# Patient Record
Sex: Male | Born: 1965 | Race: Black or African American | Hispanic: No | Smoking: Never smoker
Health system: Southern US, Community
[De-identification: ages and names within clinical notes are randomized; demographics above are authoritative.]

## PROBLEM LIST (undated history)

## (undated) ENCOUNTER — Emergency Department (HOSPITAL_COMMUNITY): Admission: EM | Payer: 59 | Source: Home / Self Care

## (undated) DIAGNOSIS — Z9989 Dependence on other enabling machines and devices: Secondary | ICD-10-CM

## (undated) DIAGNOSIS — D649 Anemia, unspecified: Secondary | ICD-10-CM

## (undated) DIAGNOSIS — J189 Pneumonia, unspecified organism: Secondary | ICD-10-CM

## (undated) DIAGNOSIS — M86272 Subacute osteomyelitis, left ankle and foot: Secondary | ICD-10-CM

## (undated) DIAGNOSIS — N189 Chronic kidney disease, unspecified: Secondary | ICD-10-CM

## (undated) DIAGNOSIS — M869 Osteomyelitis, unspecified: Secondary | ICD-10-CM

## (undated) DIAGNOSIS — I10 Essential (primary) hypertension: Secondary | ICD-10-CM

## (undated) DIAGNOSIS — I739 Peripheral vascular disease, unspecified: Secondary | ICD-10-CM

## (undated) SURGERY — AMPUTATION BELOW KNEE
Anesthesia: Choice | Site: Knee | Laterality: Right

---

## 1999-07-31 ENCOUNTER — Emergency Department (HOSPITAL_COMMUNITY): Admission: EM | Admit: 1999-07-31 | Discharge: 1999-07-31 | Payer: Self-pay | Admitting: Emergency Medicine

## 2008-02-04 ENCOUNTER — Emergency Department (HOSPITAL_COMMUNITY): Admission: EM | Admit: 2008-02-04 | Discharge: 2008-02-04 | Payer: Self-pay | Admitting: Emergency Medicine

## 2008-02-20 DIAGNOSIS — Z794 Long term (current) use of insulin: Secondary | ICD-10-CM | POA: Insufficient documentation

## 2008-02-20 DIAGNOSIS — E119 Type 2 diabetes mellitus without complications: Secondary | ICD-10-CM | POA: Insufficient documentation

## 2008-02-20 DIAGNOSIS — I10 Essential (primary) hypertension: Secondary | ICD-10-CM | POA: Insufficient documentation

## 2008-02-20 DIAGNOSIS — E1165 Type 2 diabetes mellitus with hyperglycemia: Secondary | ICD-10-CM | POA: Insufficient documentation

## 2008-03-27 ENCOUNTER — Emergency Department (HOSPITAL_COMMUNITY): Admission: EM | Admit: 2008-03-27 | Discharge: 2008-03-28 | Payer: Self-pay | Admitting: Emergency Medicine

## 2008-05-19 ENCOUNTER — Emergency Department (HOSPITAL_COMMUNITY): Admission: EM | Admit: 2008-05-19 | Discharge: 2008-05-19 | Payer: Self-pay | Admitting: Emergency Medicine

## 2008-06-22 ENCOUNTER — Emergency Department (HOSPITAL_COMMUNITY): Admission: EM | Admit: 2008-06-22 | Discharge: 2008-06-22 | Payer: Self-pay | Admitting: Emergency Medicine

## 2008-08-05 ENCOUNTER — Emergency Department (HOSPITAL_COMMUNITY): Admission: EM | Admit: 2008-08-05 | Discharge: 2008-08-05 | Payer: Self-pay | Admitting: Emergency Medicine

## 2008-08-20 ENCOUNTER — Emergency Department (HOSPITAL_COMMUNITY): Admission: EM | Admit: 2008-08-20 | Discharge: 2008-08-20 | Payer: Self-pay | Admitting: Emergency Medicine

## 2008-09-18 ENCOUNTER — Emergency Department (HOSPITAL_COMMUNITY): Admission: EM | Admit: 2008-09-18 | Discharge: 2008-09-19 | Payer: Self-pay | Admitting: Emergency Medicine

## 2008-10-25 ENCOUNTER — Emergency Department (HOSPITAL_COMMUNITY): Admission: EM | Admit: 2008-10-25 | Discharge: 2008-10-25 | Payer: Self-pay | Admitting: Emergency Medicine

## 2008-12-04 ENCOUNTER — Emergency Department (HOSPITAL_COMMUNITY): Admission: EM | Admit: 2008-12-04 | Discharge: 2008-12-05 | Payer: Self-pay | Admitting: Emergency Medicine

## 2009-02-11 ENCOUNTER — Emergency Department (HOSPITAL_COMMUNITY): Admission: EM | Admit: 2009-02-11 | Discharge: 2009-02-11 | Payer: Self-pay | Admitting: Emergency Medicine

## 2009-03-18 ENCOUNTER — Emergency Department (HOSPITAL_COMMUNITY): Admission: EM | Admit: 2009-03-18 | Discharge: 2009-03-18 | Payer: Self-pay | Admitting: Emergency Medicine

## 2009-03-25 ENCOUNTER — Emergency Department (HOSPITAL_COMMUNITY): Admission: EM | Admit: 2009-03-25 | Discharge: 2009-03-25 | Payer: Self-pay | Admitting: Emergency Medicine

## 2009-04-18 ENCOUNTER — Emergency Department (HOSPITAL_COMMUNITY): Admission: EM | Admit: 2009-04-18 | Discharge: 2009-04-18 | Payer: Self-pay | Admitting: Emergency Medicine

## 2009-04-20 ENCOUNTER — Emergency Department (HOSPITAL_COMMUNITY): Admission: EM | Admit: 2009-04-20 | Discharge: 2009-04-20 | Payer: Self-pay | Admitting: Emergency Medicine

## 2009-04-26 ENCOUNTER — Encounter: Payer: Self-pay | Admitting: *Deleted

## 2009-05-02 ENCOUNTER — Emergency Department (HOSPITAL_COMMUNITY): Admission: EM | Admit: 2009-05-02 | Discharge: 2009-05-02 | Payer: Self-pay | Admitting: Emergency Medicine

## 2009-05-11 ENCOUNTER — Emergency Department (HOSPITAL_COMMUNITY): Admission: EM | Admit: 2009-05-11 | Discharge: 2009-05-12 | Payer: Self-pay | Admitting: Emergency Medicine

## 2009-06-15 ENCOUNTER — Emergency Department (HOSPITAL_COMMUNITY): Admission: EM | Admit: 2009-06-15 | Discharge: 2009-06-15 | Payer: Self-pay | Admitting: Emergency Medicine

## 2009-07-25 ENCOUNTER — Emergency Department (HOSPITAL_COMMUNITY): Admission: EM | Admit: 2009-07-25 | Discharge: 2009-07-25 | Payer: Self-pay | Admitting: Emergency Medicine

## 2009-09-03 ENCOUNTER — Emergency Department (HOSPITAL_COMMUNITY): Admission: EM | Admit: 2009-09-03 | Discharge: 2009-09-03 | Payer: Self-pay | Admitting: Emergency Medicine

## 2009-10-10 ENCOUNTER — Emergency Department (HOSPITAL_COMMUNITY): Admission: EM | Admit: 2009-10-10 | Discharge: 2009-10-10 | Payer: Self-pay | Admitting: Emergency Medicine

## 2009-10-12 ENCOUNTER — Emergency Department (HOSPITAL_COMMUNITY): Admission: EM | Admit: 2009-10-12 | Discharge: 2009-10-12 | Payer: Self-pay | Admitting: Emergency Medicine

## 2009-11-14 ENCOUNTER — Emergency Department (HOSPITAL_COMMUNITY): Admission: EM | Admit: 2009-11-14 | Discharge: 2009-11-14 | Payer: Self-pay | Admitting: Emergency Medicine

## 2009-12-23 ENCOUNTER — Emergency Department (HOSPITAL_COMMUNITY): Admission: EM | Admit: 2009-12-23 | Discharge: 2009-12-23 | Payer: Self-pay | Admitting: Emergency Medicine

## 2010-01-25 ENCOUNTER — Emergency Department (HOSPITAL_COMMUNITY)
Admission: EM | Admit: 2010-01-25 | Discharge: 2010-01-25 | Payer: Self-pay | Source: Home / Self Care | Admitting: Emergency Medicine

## 2010-01-25 LAB — CONVERTED CEMR LAB
Creatinine, Ser: 1.23 mg/dL
MCHC: 27.6 g/dL
Potassium: 4 meq/L
RBC: 4.68 M/uL
Sodium: 137 meq/L
WBC: 4.9 10*3/uL

## 2010-01-27 ENCOUNTER — Ambulatory Visit: Payer: Self-pay | Admitting: Nurse Practitioner

## 2010-01-27 DIAGNOSIS — E669 Obesity, unspecified: Secondary | ICD-10-CM | POA: Insufficient documentation

## 2010-01-27 DIAGNOSIS — M109 Gout, unspecified: Secondary | ICD-10-CM | POA: Insufficient documentation

## 2010-01-27 LAB — CONVERTED CEMR LAB
Bilirubin Urine: NEGATIVE
Blood in Urine, dipstick: NEGATIVE
Glucose, Urine, Semiquant: 100
Ketones, urine, test strip: NEGATIVE
Nitrite: NEGATIVE

## 2010-03-21 NOTE — Assessment & Plan Note (Signed)
Summary: [facility] F/U   Vital Signs:  Patient profile:   45 year old male Height:      76 inches Weight:      355.0 pounds BMI:     43.37 Temp:     97.8 degrees F oral Pulse rate:   68 / minute Pulse rhythm:   regular Resp:     16 per minute BP sitting:   136 / 100  (left arm) Cuff size:   regular  Vitals Entered By: Isla Pence (January 27, 2010 10:06 AM)  Nutrition Counseling: Patient's BMI is greater than 25 and therefore counseled on weight management options. CC: follow-up visit [facility], Hypertension Management Is Patient Diabetic? Yes Pain Assessment Patient in pain? no      CBG Result 169 CBG Device ID B  Does patient need assistance? Functional Status Self care Ambulation Normal   CC:  follow-up visit [facility] and Hypertension Management.  History of Present Illness:  Pt into the office to estabish care. No PCP rather he has been using the ER for PCP to get refills on his meds. He was seen in the ER on 01/25/2010(discharge reviewed)  Obesity - pt has lost 30 pounds and admits that he still need to work on his diet and exercise to lose weight.  Pt did not bring his medications with him to this office.    Diabetes Management History:      The patient is a 45 years old male who comes in for evaluation of DM Type 2.  He has not been enrolled in the "Diabetic Education Program".  He states understanding of dietary principles but he is not following the appropriate diet.  No sensory loss is reported.  Self foot exams are not being performed.  He is not checking home blood sugars.  He says that he is not exercising regularly.        Hypoglycemic symptoms are not occurring.  No hyperglycemic symptoms are reported.    Hypertension History:      He denies headache, chest pain, and palpitations.  He notes no problems with any antihypertensive medication side effects.        Positive major cardiovascular risk factors include diabetes and hypertension.  Negative  major cardiovascular risk factors include male age less than 17 years old, no history of hyperlipidemia, and non-tobacco-user status.     Habits & Providers  Alcohol-Tobacco-Diet     Alcohol drinks/day: 0     Tobacco Status: never  Exercise-Depression-Behavior     Does Patient Exercise: no     Have you felt down or hopeless? no     Have you felt little pleasure in things? no     Depression Counseling: not indicated; screening negative for depression     Drug Use: never  Allergies (verified): No Known Drug Allergies  Family History: father - gout mother - diabetes, htn   Social History: Married tobacco -  Drug -  [place] - Smoking Status:  never Drug Use:  never Does Patient Exercise:  no  Review of Systems General:  Denies loss of appetite. CV:  Denies chest pain or discomfort. Resp:  Denies cough. GI:  Denies abdominal pain, nausea, and vomiting.  Physical Exam  General:  alert.   Head:  normocephalic.   Lungs:  normal breath sounds.   Heart:  normal rate and regular rhythm.   Abdomen:  normal bowel sounds.   Neurologic:  alert & oriented X3.   Skin:  color  normal.   Psych:  Oriented X3.     Impression & Recommendations:  Problem # 1:  HYPERTENSION, BENIGN ESSENTIAL (ICD-401.1)  Pt is has a history  fo htn.  Will restart meds DASH diet His updated medication list for this problem includes:    Lisinopril-hydrochlorothiazide 20-25 Mg Tabs (Lisinopril-hydrochlorothiazide) ..... One tablet by mouth daily for blood pressure  Problem # 2:  DIABETES MELLITUS, TYPE II (ICD-250.00) Advised pt to restart on meds  Orders: Capillary Blood Glucose/CBG [id number])  His updated medication list for this problem includes:    Metformin Hcl 500 Mg Tabs (Metformin hcl) ..... One tablet by mouth two times a day for blood sugar    Lisinopril-hydrochlorothiazide 20-25 Mg Tabs (Lisinopril-hydrochlorothiazide) ..... One tablet by mouth daily for blood pressure  Problem # 3:   OBESITY (ICD-278.00) advised pt to continue to monitor   Problem # 4:  NEED PROPHYLACTIC VACCINATION&INOCULATION FLU (ICD-V04.81) given today in office  Complete Medication List: 1)  Metformin Hcl 500 Mg Tabs (Metformin hcl) .... One tablet by mouth two times a day for blood sugar 2)  Lisinopril-hydrochlorothiazide 20-25 Mg Tabs (Lisinopril-hydrochlorothiazide) .... One tablet by mouth daily for blood pressure  Other Orders: Flu Vaccine 57yrs + [id number]) Admin 1st Vaccine [id number])  Diabetes Management Assessment/Plan:      His blood pressure goal is < 130/80.    Hypertension Assessment/Plan:      The patient's hypertensive risk group is category C: Target organ damage and/or diabetes.  Today's blood pressure is 136/100.  His blood pressure goal is < 130/80.  Patient Instructions: 1)  Blood pressure -elevated today. 2)  You will need to restarrt your medications 3)  Diabetes - You should check your blood sugar daily before breakfast.  You can get a glucometer from [facility] (you will need a prescription) after you use the supplies from your current meter 4)  Keep your appointment for eligibility to get an orange card. 5)  Follow up in 2 months with n.martin,fnp for diabetes 6)  Come fasting after midnight before this visit 7)  You will need cbg, u/a, hgba1c, microalbumin.  Will need lipid, uric acid level. Prescriptions: LISINOPRIL-HYDROCHLOROTHIAZIDE 20-25 MG TABS (LISINOPRIL-HYDROCHLOROTHIAZIDE) One tablet by mouth daily for blood pressure  #30 x 3   Entered and Authorized by:   Aurora Mask FNP   Signed by:   Aurora Mask FNP on 01/27/2010   Method used:   Print then Give to Patient   RxID:   [id number] METFORMIN HCL 500 MG TABS (METFORMIN HCL) One tablet by mouth two times a day for blood sugar  #60 x 3   Entered and Authorized by:   Aurora Mask FNP   Signed by:   Aurora Mask FNP on 01/27/2010   Method used:   Print then Give to Patient   RxID:    [id number]    Orders Added: 1)  Capillary Blood Glucose/CBG [82948] 2)  New Patient Level III [99203] 3)  Flu Vaccine 93yrs + [id number] 4)  Admin 1st Vaccine [90471]   Immunizations Administered:  Influenza Vaccine # 1:    Vaccine Type: Fluvax 3+    Site: left deltoid    Mfr: GlaxoSmithKline    Dose: 0.5 ml    Route: IM    Given by: Isla Pence    Exp. Date: 08/19/2010    Lot #: [id number]    VIS given: 09/13/09 version given January 27, 2010.  Flu Vaccine Consent Questions:    Do you have  a history of severe allergic reactions to this vaccine? no    Any prior history of allergic reactions to egg and/or gelatin? no    Do you have a sensitivity to the preservative Thimersol? no    Do you have a past history of Guillan-Barre Syndrome? no    Do you currently have an acute febrile illness? no    Have you ever had a severe reaction to latex? no    Vaccine information given and explained to patient? yes    ndc  [phone]  Immunizations Administered:  Influenza Vaccine # 1:    Vaccine Type: Fluvax 3+    Site: left deltoid    Mfr: GlaxoSmithKline    Dose: 0.5 ml    Route: IM    Given by: Isla Pence    Exp. Date: 08/19/2010    Lot #: [id number]    VIS given: 09/13/09 version given January 27, 2010.  Laboratory Results   Urine Tests  Date/Time Received: January 27, 2010 10:24 AM   Routine Urinalysis   Color: lt. yellow Appearance: Clear Glucose: 100   (Normal Range: Negative) Bilirubin: negative   (Normal Range: Negative) Ketone: negative   (Normal Range: Negative) Spec. Gravity: >=1.030   (Normal Range: 1.003-1.035) Blood: negative   (Normal Range: Negative) pH: 5.5   (Normal Range: 5.0-8.0) Protein: negative   (Normal Range: Negative) Urobilinogen: 0.2   (Normal Range: 0-1) Nitrite: negative   (Normal Range: Negative) Leukocyte Esterace: trace   (Normal Range: Negative)     Blood Tests     CBG Random:: 169     Prevention & Chronic  Care Immunizations   Influenza vaccine: Fluvax 3+  (01/27/2010)    Tetanus booster: 02/20/2007: historical per pt    Pneumococcal vaccine: Not documented  Other Screening   Smoking status: never  (01/27/2010)  Diabetes Mellitus   HgbA1C: Not documented    Eye exam: Not documented    Foot exam: Not documented   High risk foot: Not documented   Foot care education: Not documented    Urine microalbumin/creatinine ratio: Not documented  Lipids   Total Cholesterol: Not documented   LDL: Not documented   LDL Direct: Not documented   HDL: Not documented   Triglycerides: Not documented  Hypertension   Last Blood Pressure: 136 / 100  (01/27/2010)   Serum creatinine: 1.23  (01/25/2010)   Serum potassium 4.0  (01/25/2010)  Self-Management Support :    Diabetes self-management support: Not documented    Hypertension self-management support: Not documented   Nursing Instructions: Give Flu vaccine today

## 2010-03-21 NOTE — Miscellaneous (Signed)
Summary: Do Not Reschedule  Patient has no showed 2 NP appts, one in November and one in March.  Per [facility] policy he is not allowed to rechedule.  Elray Mcgregor RN  April 26, 2009 8:22 AM

## 2010-05-02 LAB — DIFFERENTIAL
Eosinophils Absolute: 0.2 10*3/uL (ref 0.0–0.7)
Lymphocytes Relative: 41 % (ref 12–46)
Lymphs Abs: 2 10*3/uL (ref 0.7–4.0)
Monocytes Absolute: 0.3 10*3/uL (ref 0.1–1.0)
Monocytes Relative: 6 % (ref 3–12)
Neutro Abs: 2.4 10*3/uL (ref 1.7–7.7)

## 2010-05-02 LAB — BASIC METABOLIC PANEL
Calcium: 9.4 mg/dL (ref 8.4–10.5)
Creatinine, Ser: 1.23 mg/dL (ref 0.4–1.5)
GFR calc Af Amer: >60 mL/min (ref >60)
Sodium: 137 mEq/L (ref 135–145)

## 2010-05-02 LAB — CBC
HCT: 39.1 % (ref 39.0–52.0)
Hemoglobin: 12.9 g/dL — ABNORMAL LOW (ref 13.0–17.0)
MCHC: 33 g/dL (ref 30.0–36.0)

## 2010-05-08 LAB — GLUCOSE, CAPILLARY: Glucose-Capillary: 278 mg/dL — ABNORMAL HIGH (ref 70–99)

## 2010-05-15 LAB — GLUCOSE, CAPILLARY: Glucose-Capillary: 223 mg/dL — ABNORMAL HIGH (ref 70–99)

## 2010-05-22 LAB — GLUCOSE, CAPILLARY: Glucose-Capillary: 305 mg/dL — ABNORMAL HIGH (ref 70–99)

## 2010-05-26 LAB — GLUCOSE, CAPILLARY: Glucose-Capillary: 176 mg/dL — ABNORMAL HIGH (ref 70–99)

## 2010-05-28 LAB — GLUCOSE, CAPILLARY: Glucose-Capillary: 237 mg/dL — ABNORMAL HIGH (ref 70–99)

## 2010-06-06 LAB — GLUCOSE, CAPILLARY: Glucose-Capillary: 451 mg/dL — ABNORMAL HIGH (ref 70–99)

## 2010-08-01 ENCOUNTER — Emergency Department (HOSPITAL_COMMUNITY)
Admission: EM | Admit: 2010-08-01 | Discharge: 2010-08-02 | Discharge status: Against Medical Advice | Payer: Medicaid Other | Source: Home / Self Care | Attending: Emergency Medicine | Admitting: Emergency Medicine

## 2010-08-01 DIAGNOSIS — I10 Essential (primary) hypertension: Secondary | ICD-10-CM | POA: Insufficient documentation

## 2010-08-01 DIAGNOSIS — E119 Type 2 diabetes mellitus without complications: Secondary | ICD-10-CM | POA: Insufficient documentation

## 2010-08-01 DIAGNOSIS — Z760 Encounter for issue of repeat prescription: Secondary | ICD-10-CM | POA: Insufficient documentation

## 2010-08-01 DIAGNOSIS — M109 Gout, unspecified: Secondary | ICD-10-CM | POA: Insufficient documentation

## 2010-08-01 DIAGNOSIS — Z0389 Encounter for observation for other suspected diseases and conditions ruled out: Secondary | ICD-10-CM | POA: Insufficient documentation

## 2010-08-02 ENCOUNTER — Emergency Department (HOSPITAL_COMMUNITY)
Admission: EM | Admit: 2010-08-02 | Discharge: 2010-08-02 | Disposition: A | Discharge status: Home / Self Care | Payer: Medicaid Other | Attending: Emergency Medicine | Admitting: Emergency Medicine

## 2010-08-02 LAB — GLUCOSE, CAPILLARY: Glucose-Capillary: 259 mg/dL — ABNORMAL HIGH (ref 70–99)

## 2010-11-17 ENCOUNTER — Inpatient Hospital Stay (INDEPENDENT_AMBULATORY_CARE_PROVIDER_SITE_OTHER)
Admission: RE | Admit: 2010-11-17 | Discharge: 2010-11-17 | Disposition: A | Discharge status: Home / Self Care | Payer: Medicaid Other | Source: Ambulatory Visit | Attending: Family Medicine | Admitting: Family Medicine

## 2010-11-17 DIAGNOSIS — M779 Enthesopathy, unspecified: Secondary | ICD-10-CM

## 2011-01-04 ENCOUNTER — Encounter: Payer: Self-pay | Admitting: Nurse Practitioner

## 2011-01-04 ENCOUNTER — Emergency Department (HOSPITAL_COMMUNITY)
Admission: EM | Admit: 2011-01-04 | Discharge: 2011-01-04 | Disposition: A | Discharge status: Home / Self Care | Payer: Medicaid Other | Attending: Emergency Medicine | Admitting: Emergency Medicine

## 2011-01-04 DIAGNOSIS — I10 Essential (primary) hypertension: Secondary | ICD-10-CM | POA: Insufficient documentation

## 2011-01-04 DIAGNOSIS — R21 Rash and other nonspecific skin eruption: Secondary | ICD-10-CM | POA: Insufficient documentation

## 2011-01-04 DIAGNOSIS — E119 Type 2 diabetes mellitus without complications: Secondary | ICD-10-CM | POA: Insufficient documentation

## 2011-01-04 DIAGNOSIS — M25421 Effusion, right elbow: Secondary | ICD-10-CM

## 2011-01-04 DIAGNOSIS — M109 Gout, unspecified: Secondary | ICD-10-CM | POA: Insufficient documentation

## 2011-01-04 DIAGNOSIS — R609 Edema, unspecified: Secondary | ICD-10-CM | POA: Insufficient documentation

## 2011-01-04 DIAGNOSIS — Z79899 Other long term (current) drug therapy: Secondary | ICD-10-CM | POA: Insufficient documentation

## 2011-01-04 DIAGNOSIS — M25429 Effusion, unspecified elbow: Secondary | ICD-10-CM | POA: Insufficient documentation

## 2011-01-04 HISTORY — DX: Essential (primary) hypertension: I10

## 2011-01-04 MED ORDER — OXYCODONE-ACETAMINOPHEN 5-325 MG PO TABS: 1.0000 | ORAL_TABLET | Freq: Four times a day (QID) | ORAL | Status: AC | PRN

## 2011-01-04 MED ORDER — INDOMETHACIN 25 MG PO CAPS: 50.0000 mg | ORAL_CAPSULE | Freq: Three times a day (TID) | ORAL | Status: DC | PRN

## 2011-01-04 NOTE — ED Notes (Signed)
C/o gout flare up x weeks. C/o pain in bilateral elbows. No meds at home for same.

## 2011-01-04 NOTE — ED Provider Notes (Signed)
Medical screening examination/treatment/procedure(s) were performed by non-physician practitioner and as supervising physician I was immediately available for consultation/collaboration.  Chauncy Passy, MD 01/04/11 2111

## 2011-01-04 NOTE — ED Provider Notes (Signed)
History     CSN: [id number] Arrival date & time: 01/04/2011 12:38 PM   First MD Initiated Contact with Patient 01/04/11 1241      Chief Complaint  Patient presents with  . Gout    (Consider location/radiation/quality/duration/timing/severity/associated sxs/prior treatment) HPI Comments: Patient presents with right elbow swelling and redness for the past 3 days. The patient has a history of gouty arthritis, with previous attacks in his elbows ankles and toes bilaterally. This attack is similar to previous. He has tried oral narcotics for pain which has helped mildly. Pain is worsened with palpation and movement. The patient denies any fevers, chills, nausea or vomiting.  Patient is a 45 y.o. male presenting with extremity pain. The history is provided by the patient.  Extremity Pain This is a recurrent problem. The current episode started in the past 7 days. The problem occurs constantly. The problem has been gradually worsening. Associated symptoms include a rash. Pertinent negatives include no arthralgias, chills, fever, nausea, numbness, vomiting or weakness. The symptoms are aggravated by bending. He has tried oral narcotics for the symptoms. The treatment provided no relief.    Past Medical History  Diagnosis Date  . Gout   . Diabetes mellitus   . Hypertension     History reviewed. No pertinent past surgical history.  History reviewed. No pertinent family history.  History  Substance Use Topics  . Smoking status: Never Smoker   . Smokeless tobacco: Not on file  . Alcohol Use: No      Review of Systems  Constitutional: Negative for fever, chills and activity change.  Gastrointestinal: Negative for nausea and vomiting.  Musculoskeletal: Negative for back pain and arthralgias.  Skin: Positive for rash. Negative for wound.  Neurological: Negative for weakness and numbness.    Allergies  Review of patient's allergies indicates no known allergies.  Home Medications     Current Outpatient Rx  Name Route Sig Dispense Refill  . OMEGA-3 FATTY ACIDS 1000 MG PO CAPS Oral Take 1 g by mouth daily.      Marland Kitchen LISINOPRIL-HYDROCHLOROTHIAZIDE 20-25 MG PO TABS Oral Take 1 tablet by mouth daily.      Marland Kitchen METFORMIN HCL 500 MG PO TABS Oral Take 500 mg by mouth 2 (two) times daily with a meal.        BP 135/116  Pulse 93  Temp(Src) 98 F (36.7 C) (Oral)  Resp 19  Ht 6\' 4"  (1.93 m)  Wt 320 lb (145.151 kg)  BMI 38.95 kg/m2  SpO2 98%  Physical Exam  Nursing note and vitals reviewed. Constitutional: He is oriented to person, place, and time. He appears well-developed and well-nourished.       Patient appears well and nontoxic.  HENT:  Head: Normocephalic and atraumatic.  Eyes: Conjunctivae are normal.  Neck: Normal range of motion.  Cardiovascular: Normal rate and regular rhythm.   Pulmonary/Chest: Effort normal and breath sounds normal.  Musculoskeletal: He exhibits edema and tenderness.       Right elbow effusion. Elbow is mildly tender to palpation with some mild overlying erythema, but no streaking.  Range of motion is decreased somewhat due to pain. No axial lymphadenopathy.  Neurological: He is alert and oriented to person, place, and time.  Skin: Skin is warm. There is erythema.    ED Course  Procedures (including critical care time)  Labs Reviewed - No data to display No results found.   1. GOUT   2. Effusion of right elbow  2:21 PM patient seen and examined. Counseled on proper use of narcotic pain medicine and to avoid other medicines containing Tylenol. Urged followup with primary care physician in the next week for a recheck. Urged patient to return with worsening pain, swelling, or fever.   MDM  Patient with history of gouty arthritis with typical gout flare of right elbow. No concern for a septic joint given lack of fever, appearance, and examination of elbow.       [place], [place] 01/04/11 1422

## 2011-01-28 ENCOUNTER — Encounter (HOSPITAL_COMMUNITY): Payer: Self-pay | Admitting: *Deleted

## 2011-01-28 ENCOUNTER — Emergency Department (HOSPITAL_COMMUNITY)
Admission: EM | Admit: 2011-01-28 | Discharge: 2011-01-28 | Disposition: A | Discharge status: Home / Self Care | Payer: Medicaid Other | Attending: Emergency Medicine | Admitting: Emergency Medicine

## 2011-01-28 DIAGNOSIS — R269 Unspecified abnormalities of gait and mobility: Secondary | ICD-10-CM | POA: Insufficient documentation

## 2011-01-28 DIAGNOSIS — M25473 Effusion, unspecified ankle: Secondary | ICD-10-CM | POA: Insufficient documentation

## 2011-01-28 DIAGNOSIS — M79609 Pain in unspecified limb: Secondary | ICD-10-CM | POA: Insufficient documentation

## 2011-01-28 DIAGNOSIS — Z79899 Other long term (current) drug therapy: Secondary | ICD-10-CM | POA: Insufficient documentation

## 2011-01-28 DIAGNOSIS — E119 Type 2 diabetes mellitus without complications: Secondary | ICD-10-CM | POA: Insufficient documentation

## 2011-01-28 DIAGNOSIS — M25579 Pain in unspecified ankle and joints of unspecified foot: Secondary | ICD-10-CM | POA: Insufficient documentation

## 2011-01-28 DIAGNOSIS — I10 Essential (primary) hypertension: Secondary | ICD-10-CM | POA: Insufficient documentation

## 2011-01-28 DIAGNOSIS — M25476 Effusion, unspecified foot: Secondary | ICD-10-CM | POA: Insufficient documentation

## 2011-01-28 DIAGNOSIS — M109 Gout, unspecified: Secondary | ICD-10-CM

## 2011-01-28 MED ORDER — INDOMETHACIN 25 MG PO CAPS: 50.0000 mg | ORAL_CAPSULE | Freq: Three times a day (TID) | ORAL | Status: AC | PRN

## 2011-01-28 MED ORDER — HYDROCODONE-ACETAMINOPHEN 5-325 MG PO TABS: 1.0000 | ORAL_TABLET | Freq: Four times a day (QID) | ORAL | Status: AC | PRN

## 2011-01-28 NOTE — ED Provider Notes (Signed)
History     CSN: [id number] Arrival date & time: 01/28/2011 11:19 AM   First MD Initiated Contact with Patient 01/28/11 1240     HPI Patient reports a history of gout. States his left ankle began having his typical gout pain. States pain has progressively been worsening and is out of his indomethacin. Denies other known injury. Symptoms associated with left ankle swelling and erythema. Denies fever or purulent drainage. Reports mild improvement with elevation Patient is a 45 y.o. male presenting with ankle pain. The history is provided by the patient.  Ankle Pain  The incident occurred more than 2 days ago. The incident occurred at home. There was no injury mechanism. The pain is present in the left ankle. The quality of the pain is described as sharp. The pain is at a severity of 9/10. The pain is severe. The pain has been constant since onset. Pertinent negatives include no numbness, no inability to bear weight, no loss of motion, no muscle weakness, no loss of sensation and no tingling. He reports no foreign bodies present. The symptoms are aggravated by bearing weight, palpation and activity. He has tried rest, ice and elevation for the symptoms. The treatment provided no relief.    Past Medical History  Diagnosis Date  . Gout   . Diabetes mellitus   . Hypertension     History reviewed. No pertinent past surgical history.  History reviewed. No pertinent family history.  History  Substance Use Topics  . Smoking status: Never Smoker   . Smokeless tobacco: Not on file  . Alcohol Use: No      Review of Systems  Constitutional: Negative for fever and chills.  Gastrointestinal: Negative for nausea and vomiting.  Musculoskeletal: Positive for joint swelling and gait problem. Negative for back pain.       Ankle pain  Skin: Positive for color change. Negative for wound.  Neurological: Negative for tingling, weakness and numbness.  All other systems reviewed and are  negative.    Allergies  Review of patient's allergies indicates no known allergies.  Home Medications   Current Outpatient Rx  Name Route Sig Dispense Refill  . OMEGA-3 FATTY ACIDS 1000 MG PO CAPS Oral Take 1 g by mouth daily.      . INDOMETHACIN 25 MG PO CAPS Oral Take 2 capsules (50 mg total) by mouth 3 (three) times daily as needed. Take with food. 21 capsule 0  . LISINOPRIL-HYDROCHLOROTHIAZIDE 20-25 MG PO TABS Oral Take 1 tablet by mouth daily.      Marland Kitchen METFORMIN HCL 500 MG PO TABS Oral Take 500 mg by mouth 2 (two) times daily with a meal.        BP 120/80  Pulse 80  Temp(Src) 98.6 F (37 C) (Oral)  Resp 16  SpO2 99%  Physical Exam  Vitals reviewed. Constitutional: He is oriented to person, place, and time. He appears well-developed and well-nourished.  HENT:  Head: Normocephalic and atraumatic.  Eyes: Pupils are equal, round, and reactive to light.  Musculoskeletal:       Left ankle: He exhibits decreased range of motion and swelling. He exhibits no ecchymosis, no deformity, no laceration and normal pulse. tenderness. Medial malleolus tenderness found. Achilles tendon exhibits no pain.       Feet:  Neurological: He is alert and oriented to person, place, and time.  Skin: Skin is warm and dry. No rash noted. No erythema. No pallor.  Psychiatric: He has a normal mood and affect. His  behavior is normal.    ED Course  Procedures   MDM   Advised followup with primary care physician for prophylactic gout medication. Patient agrees with plan and is ready for discharge       Sheliah Mends, [place] 01/28/11 1454

## 2011-01-28 NOTE — ED Notes (Signed)
Reports gout pain to left foot. Is out of meds.

## 2011-01-29 MED ORDER — NITROGLYCERIN IN D5W 200-5 MCG/ML-% IV SOLN
INTRAVENOUS | Status: AC
  Filled 2011-01-29: qty 250

## 2011-01-29 NOTE — ED Provider Notes (Signed)
Medical screening examination/treatment/procedure(s) were performed by non-physician practitioner and as supervising physician I was immediately available for consultation/collaboration.   Mirna Mires, MD 01/29/11 [phone]

## 2012-03-18 ENCOUNTER — Emergency Department (HOSPITAL_COMMUNITY)
Admission: EM | Admit: 2012-03-18 | Discharge: 2012-03-18 | Disposition: A | Discharge status: Home / Self Care | Payer: Medicaid Other | Attending: Emergency Medicine | Admitting: Emergency Medicine

## 2012-03-18 ENCOUNTER — Emergency Department (HOSPITAL_COMMUNITY): Payer: Medicaid Other

## 2012-03-18 ENCOUNTER — Encounter (HOSPITAL_COMMUNITY): Payer: Self-pay | Admitting: Emergency Medicine

## 2012-03-18 DIAGNOSIS — I10 Essential (primary) hypertension: Secondary | ICD-10-CM | POA: Insufficient documentation

## 2012-03-18 DIAGNOSIS — J069 Acute upper respiratory infection, unspecified: Secondary | ICD-10-CM | POA: Insufficient documentation

## 2012-03-18 DIAGNOSIS — M109 Gout, unspecified: Secondary | ICD-10-CM | POA: Insufficient documentation

## 2012-03-18 DIAGNOSIS — R6883 Chills (without fever): Secondary | ICD-10-CM | POA: Insufficient documentation

## 2012-03-18 DIAGNOSIS — E119 Type 2 diabetes mellitus without complications: Secondary | ICD-10-CM | POA: Insufficient documentation

## 2012-03-18 DIAGNOSIS — J3489 Other specified disorders of nose and nasal sinuses: Secondary | ICD-10-CM | POA: Insufficient documentation

## 2012-03-18 DIAGNOSIS — Z79899 Other long term (current) drug therapy: Secondary | ICD-10-CM | POA: Insufficient documentation

## 2012-03-18 MED ORDER — DIPHENHYDRAMINE HCL 25 MG PO TABS: 25.0000 mg | ORAL_TABLET | Freq: Four times a day (QID) | ORAL | Status: DC

## 2012-03-18 MED ORDER — DEXTROMETHORPHAN HBR 15 MG/5ML PO SYRP: 10.0000 mL | ORAL_SOLUTION | Freq: Four times a day (QID) | ORAL | Status: DC | PRN

## 2012-03-18 NOTE — ED Notes (Signed)
Onset two days ago productive cough with nasal congestion and intermittent chills.

## 2012-03-18 NOTE — ED Provider Notes (Signed)
History     CSN: [id number]  Arrival date & time 03/18/12  [id number]   First MD Initiated Contact with Patient 03/18/12 1122      Chief Complaint  Patient presents with  . URI    (Consider location/radiation/quality/duration/timing/severity/associated sxs/prior treatment) HPI Comments: Patient is a 47 year old male who presents with a 2 day history of productive cough, nasal congestion, and chills. Symptoms started gradually and progressively worsened since the onset. Patient has not tried anything for symptoms. No aggravating/alleviating factors. No known sick contacts. No other associated symptoms.   Patient is a 47 y.o. male presenting with URI.  URI The primary symptoms include cough.  Symptoms associated with the illness include chills and congestion.    Past Medical History  Diagnosis Date  . Gout   . Diabetes mellitus   . Hypertension     History reviewed. No pertinent past surgical history.  No family history on file.  History  Substance Use Topics  . Smoking status: Never Smoker   . Smokeless tobacco: Not on file  . Alcohol Use: No      Review of Systems  Constitutional: Positive for chills.  HENT: Positive for congestion.   Respiratory: Positive for cough.   All other systems reviewed and are negative.    Allergies  Review of patient's allergies indicates no known allergies.  Home Medications   Current Outpatient Rx  Name  Route  Sig  Dispense  Refill  . OMEGA-3 FATTY ACIDS 1000 MG PO CAPS   Oral   Take 1 g by mouth daily.           . FUROSEMIDE 20 MG PO TABS   Oral   Take 20 mg by mouth daily as needed. For swelling         . INDOMETHACIN 50 MG PO CAPS   Oral   Take 50 mg by mouth 2 (two) times daily as needed. For gout         . LISINOPRIL-HYDROCHLOROTHIAZIDE 20-25 MG PO TABS   Oral   Take 1 tablet by mouth daily.           Marland Kitchen METFORMIN HCL 500 MG PO TABS   Oral   Take 500 mg by mouth 2 (two) times daily with a meal.             . ADULT MULTIVITAMIN W/MINERALS CH   Oral   Take 1 tablet by mouth daily.         Marland Kitchen PREGABALIN 25 MG PO CAPS   Oral   Take 25 mg by mouth 2 (two) times daily.           BP 138/85  Pulse 75  Temp 98.8 F (37.1 C) (Oral)  Resp 20  SpO2 99%  Physical Exam  Nursing note and vitals reviewed. Constitutional: He is oriented to person, place, and time. He appears well-developed and well-nourished. No distress.  HENT:  Head: Normocephalic and atraumatic.  Mouth/Throat: Oropharynx is clear and moist. No oropharyngeal exudate.       No sinus tenderness to palpation.   Eyes: Conjunctivae normal and EOM are normal. Pupils are equal, round, and reactive to light. No scleral icterus.  Neck: Normal range of motion. Neck supple.  Cardiovascular: Normal rate and regular rhythm.  Exam reveals no gallop and no friction rub.   No murmur heard. Pulmonary/Chest: Effort normal and breath sounds normal. He has no wheezes. He has no rales. He exhibits no tenderness.  Abdominal:  Soft. He exhibits no distension. There is no tenderness.  Musculoskeletal: Normal range of motion.  Lymphadenopathy:    He has no cervical adenopathy.  Neurological: He is alert and oriented to person, place, and time.       Speech is goal-oriented. Moves limbs without ataxia.   Skin: Skin is warm and dry.  Psychiatric: He has a normal mood and affect. His behavior is normal.    ED Course  Procedures (including critical care time)  Labs Reviewed - No data to display Dg Chest 2 View  03/18/2012  *RADIOLOGY REPORT*  Clinical Data: Cough  CHEST - 2 VIEW  Comparison: None.  Findings: Cardiomediastinal silhouette is unremarkable.  No acute infiltrate or pleural effusion.  No pulmonary edema.  Central mild bronchitic changes.  IMPRESSION: .  No acute infiltrate or pulmonary edema.  Central mild bronchitic changes.   Original Report Authenticated By: Lahoma Crocker, M.D.      1. URI, acute       MDM  11:46 AM Patient  likely has URI. Patient afebrile and non toxic appearing. Vitals stable for discharge. I will treat the patient with benadryl and dextromethorphan for congestion and cough. No further evaluation needed at this time.        Alvina Chou, PA-C 03/19/12 1619

## 2012-03-18 NOTE — ED Notes (Signed)
Discharge instructions reviewed. Pt verbalized understanding.  

## 2012-03-20 NOTE — ED Provider Notes (Signed)
Medical screening examination/treatment/procedure(s) were performed by non-physician practitioner and as supervising physician I was immediately available for consultation/collaboration.  Orlie Dakin, MD 03/20/12 [phone]

## 2013-06-08 ENCOUNTER — Emergency Department (HOSPITAL_COMMUNITY)
Admission: EM | Admit: 2013-06-08 | Discharge: 2013-06-08 | Disposition: A | Discharge status: Home / Self Care | Payer: Medicaid Other | Attending: Emergency Medicine | Admitting: Emergency Medicine

## 2013-06-08 ENCOUNTER — Encounter (HOSPITAL_COMMUNITY): Payer: Self-pay | Admitting: Emergency Medicine

## 2013-06-08 DIAGNOSIS — E119 Type 2 diabetes mellitus without complications: Secondary | ICD-10-CM | POA: Insufficient documentation

## 2013-06-08 DIAGNOSIS — I10 Essential (primary) hypertension: Secondary | ICD-10-CM | POA: Insufficient documentation

## 2013-06-08 DIAGNOSIS — Z79899 Other long term (current) drug therapy: Secondary | ICD-10-CM | POA: Insufficient documentation

## 2013-06-08 DIAGNOSIS — M109 Gout, unspecified: Secondary | ICD-10-CM | POA: Insufficient documentation

## 2013-06-08 MED ORDER — INDOMETHACIN 50 MG PO CAPS: 50.0000 mg | ORAL_CAPSULE | Freq: Two times a day (BID) | ORAL | Status: DC | PRN

## 2013-06-08 MED ORDER — INDOMETHACIN 25 MG PO CAPS
50.0000 mg | ORAL_CAPSULE | Freq: Once | ORAL | Status: AC
  Administered 2013-06-08: 50 mg via ORAL
  Filled 2013-06-08: qty 2

## 2013-06-08 MED ORDER — HYDROCODONE-ACETAMINOPHEN 5-325 MG PO TABS: 1.0000 | ORAL_TABLET | ORAL | Status: DC | PRN

## 2013-06-08 NOTE — ED Provider Notes (Signed)
CSN: [id number]     Arrival date & time 06/08/13  1615 History  This chart was scribed for non-physician practitioner Noland Fordyce, PA-C working with Osvaldo Shipper, MD by Eston Mould, ED Scribe. This patient was seen in room TR05C/TR05C and the patient's care was started at 6:07 PM .   Chief Complaint  Patient presents with  . Gout   The history is provided by the patient. No language interpreter was used.   HPI Comments: Patrick [name] is a 48 y.o. male who presents to the Emergency Department complaining of gout flare-up to L ankle that began over the weekend and has worsened. Pain is constant, aching, throbbing, 10/10, worse with palpation and ambulation. He reports being scheduled for an apt with PCP in 2 days and states PCP will not prescribe medication. Pt states he generally takes Indomethacin with immediate relief . Pt denies having medication at home at home. He denies allergies to medications, fever and gout to L knee.  Past Medical History  Diagnosis Date  . Gout   . Diabetes mellitus   . Hypertension    History reviewed. No pertinent past surgical history. No family history on file. History  Substance Use Topics  . Smoking status: Never Smoker   . Smokeless tobacco: Not on file  . Alcohol Use: No    Review of Systems  Constitutional: Negative for fever.  Skin: Negative for wound.   Allergies  Review of patient's allergies indicates no known allergies.  Home Medications   Prior to Admission medications   Medication Sig Start Date End Date Taking? Authorizing Provider  lisinopril-hydrochlorothiazide (PRINZIDE,ZESTORETIC) 20-25 MG per tablet Take 1 tablet by mouth daily.    Yes Historical Provider, MD  metFORMIN (GLUCOPHAGE) 500 MG tablet Take 500 mg by mouth 2 (two) times daily with a meal.    Yes Historical Provider, MD  Multiple Vitamin (MULTIVITAMIN WITH MINERALS) TABS Take 1 tablet by mouth daily.   Yes Historical Provider, MD  indomethacin  (INDOCIN) 50 MG capsule Take 50 mg by mouth 2 (two) times daily as needed. For gout    Historical Provider, MD   BP 116/82  Pulse 95  Temp(Src) 98.8 F (37.1 C) (Oral)  Resp 16  Ht 6\' 4"  (1.93 m)  Wt 310 lb (140.615 kg)  BMI 37.75 kg/m2  SpO2 100%  Physical Exam  Nursing note and vitals reviewed. Constitutional: He is oriented to person, place, and time. He appears well-developed and well-nourished.  HENT:  Head: Normocephalic and atraumatic.  Eyes: EOM are normal.  Neck: Normal range of motion.  Cardiovascular: Normal rate.   Pulmonary/Chest: Effort normal.  Musculoskeletal: Normal range of motion.  L ankle moderate edema with circumferential tenderness. Skin intact. Warm to touch. No ecchymosis or erythema. Dorsalis pedis pulse 2+.   Neurological: He is alert and oriented to person, place, and time.  Skin: Skin is warm and dry.  Psychiatric: He has a normal mood and affect. His behavior is normal.    ED Course  Procedures  DIAGNOSTIC STUDIES: Oxygen Saturation is 100% on RA, normal by my interpretation.    COORDINATION OF CARE: 6:10 PM-Discussed treatment plan which includes discharge pt with Indomethacin  and Vicodin. Advised pt to F/U with PCP. Pt agreed to plan.   Labs Review Labs Reviewed - No data to display  Imaging Review No results found.   EKG Interpretation None     MDM   Final diagnoses:  Gout attack   Pt presenting with gout  attack of left ankle, unable to f/u with PCP until Wednesday, 4/22.  No evidence of underlying infection. Left foot neurovascularly in tact. Rx: indomethacin and norco. Advised to f/u as scheduled with PCP. Return precautions provided. Pt verbalized understanding and agreement with tx plan.  I personally performed the services described in this documentation, which was scribed in my presence. The recorded information has been reviewed and is accurate.   Noland Fordyce, PA-C 06/08/13 1842

## 2013-06-08 NOTE — Discharge Instructions (Signed)
Gout Gout is when your joints become red, sore, and swell (inflammed). This is caused by the buildup of uric acid crystals in the joints. Uric acid is a chemical that is normally in the blood. If the level of uric acid gets too high in the blood, these crystals form in your joints and tissues. Over time, these crystals can form into masses near the joints and tissues. These masses can destroy bone and cause the bone to look misshapen (deformed). HOME CARE   Do not take aspirin for pain.  Only take medicine as told by your doctor.  Rest the joint as much as you can. When in bed, keep sheets and blankets off painful areas.  Keep the sore joints raised (elevated).  Put warm or cold packs on painful joints. Use of warm or cold packs depends on which works best for you.  Use crutches if the painful joint is in your leg.  Drink enough fluids to keep your pee (urine) clear or pale yellow. Limit alcohol, sugary drinks, and drinks with fructose in them.  Follow your diet instructions. Pay careful attention to how much protein you eat. Include fruits, vegetables, whole grains, and fat-free or low-fat milk products in your daily diet. Talk to your doctor or dietician about the use of coffee, vitamin C, and cherries. These may help lower uric acid levels.  Keep a healthy body weight. GET HELP RIGHT AWAY IF:   You have watery poop (diarrhea), throw up (vomit), or have any side effects from medicines.  You do not feel better in 24 hours, or you are getting worse.  Your joint becomes suddenly more tender, and you have chills or a fever. MAKE SURE YOU:   Understand these instructions.  Will watch your condition.  Will get help right away if you are not doing well or get worse. Document Released: 11/15/2007 Document Revised: 06/02/2012 Document Reviewed: 05/16/2009 [facility] Patient Information 2014 [place].  Purine Restricted Diet A low-purine diet consists of foods that reduce uric  acid made in your body. INDICATIONS FOR USE  Your caregiver may ask you to follow a low-purine diet to reduce gout flairs.  GUIDELINES  Avoid high-purine foods, including all alcohol, yeast extracts taken as supplements, and sauces made from meats (like gravy). Do not eat high-purine meats, including anchovies, sardines, herring, mussels, tuna, codfish, scallops, trout, haddock, bacon, organ meats, tripe, goose, wild game, and sweetbreads.  Grains  Allowed/Recommended: All, except those listed to consume in moderation.  Consume in Moderation: Oatmeal ( cup uncooked daily), wheat bran or germ ( cup daily), and whole grains. Vegetables  Allowed/Recommended: All, except those listed to consume in moderation.  Consume in Moderation: Asparagus, cauliflower, spinach, mushrooms, and green peas ( cup daily). Fruit  Allowed/Recommended: All.  Consume in Moderation: None. Meat and Meat Substitutes  Allowed/Recommended: Eggs, nuts, and peanut butter.  Consume in Moderation: Limit to 4 to 6 oz daily. Avoid high-purine meats. Lentils, peas, and dried beans (1 cup daily). Milk  Allowed/Recommended: All. Choose low-fat or skim when possible.  Consume in Moderation: None. Fats and Oils  Allowed/Recommended: All.  Consume in Moderation: None. Beverages  Allowed/Recommended: All, except those listed to avoid.  Avoid: All alcohol. Condiments/Miscellaneous  Allowed/Recommended: All, except those listed to consume in moderation.  Consume in Moderation: Bouillon and meat-based broths and soups. Document Released: 06/02/2010 Document Revised: 04/30/2011 Document Reviewed: 06/02/2010 [facility] Patient Information 2014 [place].

## 2013-06-08 NOTE — ED Notes (Signed)
Pt reporting gout flare up to left ankle. Couldn't get appointment with PCP for pain meds. Reports swelling to left ankle. Requesting pain medication for tonight and tomorrow. Pt using crutches to ambulate. Pt is a x 4

## 2013-06-12 NOTE — ED Provider Notes (Signed)
Medical screening examination/treatment/procedure(s) were performed by non-physician practitioner and as supervising physician I was immediately available for consultation/collaboration.   EKG Interpretation None        Osvaldo Shipper, MD 06/12/13 0030

## 2013-10-26 ENCOUNTER — Emergency Department (HOSPITAL_COMMUNITY): Payer: Medicaid Other

## 2013-10-26 ENCOUNTER — Emergency Department (HOSPITAL_COMMUNITY)
Admission: EM | Admit: 2013-10-26 | Discharge: 2013-10-26 | Disposition: A | Discharge status: Home / Self Care | Payer: Medicaid Other | Attending: Emergency Medicine | Admitting: Emergency Medicine

## 2013-10-26 ENCOUNTER — Encounter (HOSPITAL_COMMUNITY): Payer: Self-pay | Admitting: Emergency Medicine

## 2013-10-26 DIAGNOSIS — R1011 Right upper quadrant pain: Secondary | ICD-10-CM | POA: Insufficient documentation

## 2013-10-26 DIAGNOSIS — R0789 Other chest pain: Secondary | ICD-10-CM

## 2013-10-26 DIAGNOSIS — M109 Gout, unspecified: Secondary | ICD-10-CM | POA: Insufficient documentation

## 2013-10-26 DIAGNOSIS — Z79899 Other long term (current) drug therapy: Secondary | ICD-10-CM | POA: Insufficient documentation

## 2013-10-26 DIAGNOSIS — I10 Essential (primary) hypertension: Secondary | ICD-10-CM | POA: Insufficient documentation

## 2013-10-26 DIAGNOSIS — Z791 Long term (current) use of non-steroidal anti-inflammatories (NSAID): Secondary | ICD-10-CM | POA: Insufficient documentation

## 2013-10-26 DIAGNOSIS — R071 Chest pain on breathing: Secondary | ICD-10-CM | POA: Insufficient documentation

## 2013-10-26 DIAGNOSIS — E119 Type 2 diabetes mellitus without complications: Secondary | ICD-10-CM | POA: Insufficient documentation

## 2013-10-26 DIAGNOSIS — R109 Unspecified abdominal pain: Secondary | ICD-10-CM

## 2013-10-26 LAB — URINALYSIS, ROUTINE W REFLEX MICROSCOPIC
Bilirubin Urine: NEGATIVE
GLUCOSE, UA: NEGATIVE mg/dL
Hgb urine dipstick: NEGATIVE
Ketones, ur: NEGATIVE mg/dL
Nitrite: NEGATIVE
Protein, ur: NEGATIVE mg/dL
Specific Gravity, Urine: 1.014 (ref 1.005–1.030)
UROBILINOGEN UA: 0.2 mg/dL (ref 0.0–1.0)
pH: 6 (ref 5.0–8.0)

## 2013-10-26 LAB — COMPREHENSIVE METABOLIC PANEL
ALT: 16 U/L (ref 0–53)
ANION GAP: 11 (ref 5–15)
AST: 18 U/L (ref 0–37)
Albumin: 3.5 g/dL (ref 3.5–5.2)
Alkaline Phosphatase: 80 U/L (ref 39–117)
BILIRUBIN TOTAL: 0.4 mg/dL (ref 0.3–1.2)
BUN: 17 mg/dL (ref 6–23)
CALCIUM: 9.1 mg/dL (ref 8.4–10.5)
CHLORIDE: 101 meq/L (ref 96–112)
CO2: 26 mEq/L (ref 19–32)
CREATININE: 1.44 mg/dL — AB (ref 0.50–1.35)
GFR calc Af Amer: 66 mL/min — ABNORMAL LOW (ref >90)
GFR calc non Af Amer: 57 mL/min — ABNORMAL LOW (ref >90)
Glucose, Bld: 194 mg/dL — ABNORMAL HIGH (ref 70–99)
Potassium: 4.1 mEq/L (ref 3.7–5.3)
Sodium: 138 mEq/L (ref 137–147)
TOTAL PROTEIN: 6.6 g/dL (ref 6.0–8.3)

## 2013-10-26 LAB — I-STAT TROPONIN, ED: TROPONIN I, POC: 0 ng/mL (ref 0.00–0.08)

## 2013-10-26 LAB — CBC WITH DIFFERENTIAL/PLATELET
BASOS ABS: 0 10*3/uL (ref 0.0–0.1)
BASOS PCT: 0 % (ref 0–1)
EOS ABS: 0.2 10*3/uL (ref 0.0–0.7)
Eosinophils Relative: 3 % (ref 0–5)
HEMATOCRIT: 36.4 % — AB (ref 39.0–52.0)
Hemoglobin: 11.9 g/dL — ABNORMAL LOW (ref 13.0–17.0)
Lymphocytes Relative: 39 % (ref 12–46)
Lymphs Abs: 2.1 10*3/uL (ref 0.7–4.0)
MCH: 27.5 pg (ref 26.0–34.0)
MCHC: 32.7 g/dL (ref 30.0–36.0)
MCV: 84.3 fL (ref 78.0–100.0)
MONO ABS: 0.3 10*3/uL (ref 0.1–1.0)
MONOS PCT: 5 % (ref 3–12)
Neutro Abs: 2.8 10*3/uL (ref 1.7–7.7)
Neutrophils Relative %: 53 % (ref 43–77)
Platelets: 273 10*3/uL (ref 150–400)
RBC: 4.32 MIL/uL (ref 4.22–5.81)
RDW: 14.1 % (ref 11.5–15.5)
WBC: 5.3 10*3/uL (ref 4.0–10.5)

## 2013-10-26 LAB — URINE MICROSCOPIC-ADD ON

## 2013-10-26 LAB — LIPASE, BLOOD: Lipase: 44 U/L (ref 11–59)

## 2013-10-26 MED ORDER — OXYCODONE-ACETAMINOPHEN 5-325 MG PO TABS
2.0000 | ORAL_TABLET | Freq: Once | ORAL | Status: AC
  Administered 2013-10-26: 2 via ORAL
  Filled 2013-10-26: qty 2

## 2013-10-26 MED ORDER — OXYCODONE-ACETAMINOPHEN 5-325 MG PO TABS
1.0000 | ORAL_TABLET | ORAL | Status: DC | PRN
Start: 2013-10-26 — End: 2015-06-13

## 2013-10-26 NOTE — ED Provider Notes (Signed)
Medical screening examination/treatment/procedure(s) were performed by non-physician practitioner and as supervising physician I was immediately available for consultation/collaboration.   EKG Interpretation None        Sharyon Cable, MD 10/26/13 [phone]

## 2013-10-26 NOTE — Discharge Instructions (Signed)
Take the prescribed medication as directed. °Follow-up with your primary care physician. °Return to the ED for new or worsening symptoms. ° °

## 2013-10-26 NOTE — ED Notes (Signed)
Patient states he slept on floor about 2 weeks ago and RUQ pain.   Patient states that resolved, but sneezed yesterday and "it felt like someone stabbed me in RUQ".   Patient states a lot of pain.  Denies any other symptoms.

## 2013-10-26 NOTE — ED Provider Notes (Signed)
CSN: [id number]     Arrival date & time 10/26/13  1032 History   First MD Initiated Contact with Patient 10/26/13 1052     Chief Complaint  Patient presents with  . Abdominal Pain     (Consider location/radiation/quality/duration/timing/severity/associated sxs/prior Treatment) Patient is a 48 y.o. male presenting with abdominal pain. The history is provided by the patient and medical records.  Abdominal Pain Associated symptoms: chest pain    This is a 48 y.o. M with PMH significant for HTN, DM, gout, presenting to the ED for right chest wall/RUQ abdominal pain.  Patient states approx 2 weeks ago he spent the night at his friend's house and slept on the floor on his right side.  States was very uncomfortable and he woke up the next morning with RUQ pain.  States it resolved but yesterday after sneezing he "felt like someone stabbed me and something popped."  States pain worse with movement and palpation, better when lying still.  Denies radiation of pain.  Denies associated nausea, vomiting, diarrhea, fever, chills, or shortness of breath. No prior abdominal surgeries. Patient has no cardiac history.  Patient is a former smoker.  VS stable on arrival.  Past Medical History  Diagnosis Date  . Gout   . Diabetes mellitus   . Hypertension    History reviewed. No pertinent past surgical history. No family history on file. History  Substance Use Topics  . Smoking status: Never Smoker   . Smokeless tobacco: Not on file  . Alcohol Use: No    Review of Systems  Cardiovascular: Positive for chest pain.  Gastrointestinal: Positive for abdominal pain.  All other systems reviewed and are negative.     Allergies  Review of patient's allergies indicates no known allergies.  Home Medications   Prior to Admission medications   Medication Sig Start Date End Date Taking? Authorizing Provider  HYDROcodone-acetaminophen (NORCO/VICODIN) 5-325 MG per tablet Take 1-2 tablets by mouth every 4  (four) hours as needed for severe pain. 06/08/13   Noland Fordyce, PA-C  indomethacin (INDOCIN) 50 MG capsule Take 1 capsule (50 mg total) by mouth 2 (two) times daily as needed. For gout 06/08/13   Noland Fordyce, PA-C  lisinopril-hydrochlorothiazide (PRINZIDE,ZESTORETIC) 20-25 MG per tablet Take 1 tablet by mouth daily.     Historical Provider, MD  metFORMIN (GLUCOPHAGE) 500 MG tablet Take 500 mg by mouth 2 (two) times daily with a meal.     Historical Provider, MD  Multiple Vitamin (MULTIVITAMIN WITH MINERALS) TABS Take 1 tablet by mouth daily.    Historical Provider, MD   BP 159/87  Pulse 72  Temp(Src) 97.7 F (36.5 C) (Oral)  Resp 22  Ht 6\' 4"  (1.93 m)  Wt 330 lb (149.687 kg)  BMI 40.19 kg/m2  SpO2 96%  Physical Exam  Nursing note and vitals reviewed. Constitutional: He is oriented to person, place, and time. He appears well-developed and well-nourished.  HENT:  Head: Normocephalic and atraumatic.  Mouth/Throat: Oropharynx is clear and moist.  Eyes: Conjunctivae and EOM are normal. Pupils are equal, round, and reactive to light.  Neck: Normal range of motion.  Cardiovascular: Normal rate, regular rhythm and normal heart sounds.   Pulmonary/Chest: Effort normal and breath sounds normal. He has no wheezes. He has no rhonchi.  Tenderness to palpation over right lower anterior chest wall, no bruising or bony deformities; no crepitus; lungs CTAB  Abdominal: Soft. Bowel sounds are normal. There is tenderness in the right upper quadrant. There is no  rigidity, no guarding, no CVA tenderness and negative Murphy's sign.  Abdomen soft, nondistended, minimal tenderness of RUQ with negative Murphy's sign  Musculoskeletal: Normal range of motion.  Neurological: He is alert and oriented to person, place, and time.  Skin: Skin is warm and dry.  Psychiatric: He has a normal mood and affect.    ED Course  Procedures (including critical care time) Labs Review Labs Reviewed  CBC WITH DIFFERENTIAL  - Abnormal; Notable for the following:    Hemoglobin 11.9 (*)    HCT 36.4 (*)    All other components within normal limits  COMPREHENSIVE METABOLIC PANEL - Abnormal; Notable for the following:    Glucose, Bld 194 (*)    Creatinine, Ser 1.44 (*)    GFR calc non Af Amer 57 (*)    GFR calc Af Amer 66 (*)    All other components within normal limits  URINALYSIS, ROUTINE W REFLEX MICROSCOPIC - Abnormal; Notable for the following:    Leukocytes, UA TRACE (*)    All other components within normal limits  URINE MICROSCOPIC-ADD ON - Abnormal; Notable for the following:    Bacteria, UA FEW (*)    All other components within normal limits  LIPASE, BLOOD  I-STAT TROPOININ, ED    Imaging Review Dg Abd Acute W/chest  10/26/2013   CLINICAL DATA:  Abdominal and chest pain.  EXAM: ACUTE ABDOMEN SERIES (ABDOMEN 2 VIEW & CHEST 1 VIEW)  COMPARISON:  Chest radiograph 03/18/2012.  FINDINGS: Trachea is midline. Heart size normal. Lungs are clear. No pleural fluid.  Two views the abdomen show a fair amount of stool throughout the colon. Minimal small bowel dilatation in the central abdomen. No unexpected radiopaque calculi.  IMPRESSION: 1. Bowel gas pattern is indicative of constipation. 2. No acute findings in the chest.   Electronically Signed   By: Lorin Picket M.D.   On: 10/26/2013 12:26     EKG Interpretation None      MDM   Final diagnoses:  Chest wall pain  Abdominal pain, unspecified abdominal location   48 year old male with chest wall and right upper quadrant abdominal pain after sneezing yesterday. States he felt as though something "popped". On exam, tenderness noted to right lower chest wall as well as right upper quadrant without rebound or guarding.  No gross deformities noted.  No SOB noted.  Patient without known cardiac hx.  Will obtain basic labs and acute abdominal series.  Percocet given for pain control.  Lab work is reassuring, SrCr appears baseline for patient when compared  with previous. Acute abdominal series negative for acute findings. After dose of Percocet, patient is feeling much better.  Repeat evaluation is benign. Suspicion that pain is musculoskeletal in nature. Low suspicion for acute cardiac or abdominal pathology at this time.  Patient will be discharged home with pain medication. He will follow with his primary care physician if problems occur.  Discussed plan with patient, he/she acknowledged understanding and agreed with plan of care.  Return precautions given for new or worsening symptoms.  Larene Pickett, PA-C 10/26/13 1455

## 2014-07-01 ENCOUNTER — Emergency Department (HOSPITAL_COMMUNITY)
Admission: EM | Admit: 2014-07-01 | Discharge: 2014-07-01 | Disposition: A | Discharge status: Home / Self Care | Payer: Medicaid Other | Attending: Emergency Medicine | Admitting: Emergency Medicine

## 2014-07-01 ENCOUNTER — Encounter (HOSPITAL_COMMUNITY): Payer: Self-pay | Admitting: Emergency Medicine

## 2014-07-01 DIAGNOSIS — I10 Essential (primary) hypertension: Secondary | ICD-10-CM | POA: Insufficient documentation

## 2014-07-01 DIAGNOSIS — Z79899 Other long term (current) drug therapy: Secondary | ICD-10-CM | POA: Insufficient documentation

## 2014-07-01 DIAGNOSIS — Z8739 Personal history of other diseases of the musculoskeletal system and connective tissue: Secondary | ICD-10-CM | POA: Insufficient documentation

## 2014-07-01 DIAGNOSIS — E119 Type 2 diabetes mellitus without complications: Secondary | ICD-10-CM | POA: Insufficient documentation

## 2014-07-01 DIAGNOSIS — M5442 Lumbago with sciatica, left side: Secondary | ICD-10-CM

## 2014-07-01 MED ORDER — NAPROXEN 500 MG PO TABS: 500.0000 mg | ORAL_TABLET | Freq: Two times a day (BID) | ORAL | Status: DC

## 2014-07-01 MED ORDER — METHOCARBAMOL 500 MG PO TABS: 500.0000 mg | ORAL_TABLET | Freq: Two times a day (BID) | ORAL | Status: DC | PRN

## 2014-07-01 NOTE — ED Provider Notes (Signed)
CSN: [id number]     Arrival date & time 07/01/14  0950 History  This chart was scribed for Waynetta Pean, PA-C working with Orpah Greek, MD by Randa Evens, ED Scribe. This patient was seen in room TR08C/TR08C and the patient's care was started at 10:12 AM.     Chief Complaint  Patient presents with  . Back Pain   Patient is a 49 y.o. male presenting with back pain. The history is provided by the patient. No language interpreter was used.  Back Pain Associated symptoms: no abdominal pain, no dysuria, no fever, no numbness and no weakness    HPI Comments: Patrick [name] is a 49 y.o. male who presents to the Emergency Department complaining of left sided low back pain for 2 weeks that recently worsened 2 days ago after playing softball. Pt states that the pain intermittently radiates to his left buttock. Pt states that the pain is worse when sitting or standing. Pt states that he has tried aleve with no relief. Pt denies numbness, tingling or weakness, difficulty urinating, bowel/bladder incontinence, difficulty urinating, hematuria, abdominal pain, nausea, vomiting, neck pain or neck stiffness. Pt denies injury or fall. Pt denies Hx of cancer or IV drug use.     Past Medical History  Diagnosis Date  . Gout   . Diabetes mellitus   . Hypertension    History reviewed. No pertinent past surgical history. No family history on file. History  Substance Use Topics  . Smoking status: Never Smoker   . Smokeless tobacco: Not on file  . Alcohol Use: No    Review of Systems  Constitutional: Negative for fever and chills.  Gastrointestinal: Negative for nausea, vomiting and abdominal pain.  Genitourinary: Negative for dysuria, hematuria and difficulty urinating.  Musculoskeletal: Positive for back pain. Negative for gait problem, neck pain and neck stiffness.  Skin: Negative for rash.  Neurological: Negative for weakness and numbness.     Allergies  Review of patient's allergies  indicates no known allergies.  Home Medications   Prior to Admission medications   Medication Sig Start Date End Date Taking? Authorizing Provider  lisinopril-hydrochlorothiazide (PRINZIDE,ZESTORETIC) 20-25 MG per tablet Take 1 tablet by mouth daily.     Historical Provider, MD  metFORMIN (GLUCOPHAGE) 500 MG tablet Take 500 mg by mouth 2 (two) times daily with a meal.     Historical Provider, MD  methocarbamol (ROBAXIN) 500 MG tablet Take 1 tablet (500 mg total) by mouth 2 (two) times daily as needed for muscle spasms. 07/01/14   Waynetta Pean, PA-C  Multiple Vitamin (MULTIVITAMIN WITH MINERALS) TABS Take 1 tablet by mouth daily.    Historical Provider, MD  naproxen (NAPROSYN) 500 MG tablet Take 1 tablet (500 mg total) by mouth 2 (two) times daily with a meal. 07/01/14   Waynetta Pean, PA-C  oxyCODONE-acetaminophen (PERCOCET/ROXICET) 5-325 MG per tablet Take 1 tablet by mouth every 4 (four) hours as needed. 10/26/13   Larene Pickett, PA-C   BP 129/88 mmHg  Pulse 75  Temp(Src) 97.5 F (36.4 C) (Oral)  Resp 20  Ht 6\' 4"  (1.93 m)  Wt 332 lb 9 oz (150.849 kg)  BMI 40.50 kg/m2  SpO2 96%   Physical Exam  Constitutional: He appears well-developed and well-nourished. No distress.  Nontoxic appearing.  HENT:  Head: Normocephalic and atraumatic.  Eyes: Conjunctivae are normal. Pupils are equal, round, and reactive to light. Right eye exhibits no discharge. Left eye exhibits no discharge.  Neck: Neck supple.  Cardiovascular:  Normal rate, regular rhythm, normal heart sounds and intact distal pulses.   Pulses:      Posterior tibial pulses are 2+ on the right side, and 2+ on the left side.  bilatareal PT pulses intact.  Pulmonary/Chest: Effort normal and breath sounds normal. No respiratory distress.  Abdominal: Soft. There is no tenderness.  Musculoskeletal:  No midline back tenderness. Left sided low back tenderness, no back erythema, edema or deformity. No calf edema or tenderness.  Strength  is 5 out of 5 to his bilateral lower extremities. The patient is able to ambulate without difficulty or assistance.  Lymphadenopathy:    He has no cervical adenopathy.  Neurological: He is alert. He has normal strength and normal reflexes. He displays normal reflexes. Coordination and gait normal.  Reflex Scores:      Patellar reflexes are 2+ on the right side and 2+ on the left side. strength 5/5 in bilateral lower extremities, able to ambulate with out difficulty. Bilateral patellar DTRs are intact. Sensation intact his bilateral lower extremities.  Skin: Skin is warm and dry. No rash noted. He is not diaphoretic. No erythema. No pallor.  Psychiatric: He has a normal mood and affect. His behavior is normal.  Nursing note and vitals reviewed.   ED Course  Procedures (including critical care time) DIAGNOSTIC STUDIES: Oxygen Saturation is 96% on RA, adequate by my interpretation.    COORDINATION OF CARE: 10:19 AM-Discussed treatment plan with pt at bedside and pt agreed to plan.     Labs Review Labs Reviewed - No data to display  Imaging Review No results found.   EKG Interpretation None      Filed Vitals:   07/01/14 0953  BP: 129/88  Pulse: 75  Temp: 97.5 F (36.4 C)  TempSrc: Oral  Resp: 20  Height: 6\' 4"  (1.93 m)  Weight: 332 lb 9 oz (150.849 kg)  SpO2: 96%     MDM   Meds given in ED:  Medications - No data to display  New Prescriptions   METHOCARBAMOL (ROBAXIN) 500 MG TABLET    Take 1 tablet (500 mg total) by mouth 2 (two) times daily as needed for muscle spasms.   NAPROXEN (NAPROSYN) 500 MG TABLET    Take 1 tablet (500 mg total) by mouth 2 (two) times daily with a meal.    Final diagnoses:  Left-sided low back pain with left-sided sciatica    Patient with left low back pain that radiates to his posterior buttocks in sciatic nerve distribution. No injury or trauma.  No neurological deficits and normal neuro exam.  Patient can walk without difficulty or  assistance.  No loss of bowel or bladder control.  No concern for cauda equina.  No fever, night sweats, weight loss, h/o cancer, IVDU.  RICE protocol and pain medicine indicated and discussed with patient.  I advised the patient to follow-up with their primary care provider this week. I advised the patient to return to the emergency department with new or worsening symptoms or new concerns. The patient verbalized understanding and agreement with plan.    I personally performed the services described in this documentation, which was scribed in my presence. The recorded information has been reviewed and is accurate.        Waynetta Pean, PA-C 07/01/14 [place], MD 07/02/14 [phone]

## 2014-07-01 NOTE — Discharge Instructions (Signed)
Sciatica °Sciatica is pain, weakness, numbness, or tingling along the path of the sciatic nerve. The nerve starts in the lower back and runs down the back of each leg. The nerve controls the muscles in the lower leg and in the back of the knee, while also providing sensation to the back of the thigh, lower leg, and the sole of your foot. Sciatica is a symptom of another medical condition. For instance, nerve damage or certain conditions, such as a herniated disk or bone spur on the spine, pinch or put pressure on the sciatic nerve. This causes the pain, weakness, or other sensations normally associated with sciatica. Generally, sciatica only affects one side of the body. °CAUSES  °· Herniated or slipped disc. °· Degenerative disk disease. °· A pain disorder involving the narrow muscle in the buttocks (piriformis syndrome). °· Pelvic injury or fracture. °· Pregnancy. °· Tumor (rare). °SYMPTOMS  °Symptoms can vary from mild to very severe. The symptoms usually travel from the low back to the buttocks and down the back of the leg. Symptoms can include: °· Mild tingling or dull aches in the lower back, leg, or hip. °· Numbness in the back of the calf or sole of the foot. °· Burning sensations in the lower back, leg, or hip. °· Sharp pains in the lower back, leg, or hip. °· Leg weakness. °· Severe back pain inhibiting movement. °These symptoms may get worse with coughing, sneezing, laughing, or prolonged sitting or standing. Also, being overweight may worsen symptoms. °DIAGNOSIS  °Your caregiver will perform a physical exam to look for common symptoms of sciatica. He or she may ask you to do certain movements or activities that would trigger sciatic nerve pain. Other tests may be performed to find the cause of the sciatica. These may include: °· Blood tests. °· X-rays. °· Imaging tests, such as an MRI or CT scan. °TREATMENT  °Treatment is directed at the cause of the sciatic pain. Sometimes, treatment is not necessary  and the pain and discomfort goes away on its own. If treatment is needed, your caregiver may suggest: °· Over-the-counter medicines to relieve pain. °· Prescription medicines, such as anti-inflammatory medicine, muscle relaxants, or narcotics. °· Applying heat or ice to the painful area. °· Steroid injections to lessen pain, irritation, and inflammation around the nerve. °· Reducing activity during periods of pain. °· Exercising and stretching to strengthen your abdomen and improve flexibility of your spine. Your caregiver may suggest losing weight if the extra weight makes the back pain worse. °· Physical therapy. °· Surgery to eliminate what is pressing or pinching the nerve, such as a bone spur or part of a herniated disk. °HOME CARE INSTRUCTIONS  °· Only take over-the-counter or prescription medicines for pain or discomfort as directed by your caregiver. °· Apply ice to the affected area for 20 minutes, 3-4 times a day for the first 48-72 hours. Then try heat in the same way. °· Exercise, stretch, or perform your usual activities if these do not aggravate your pain. °· Attend physical therapy sessions as directed by your caregiver. °· Keep all follow-up appointments as directed by your caregiver. °· Do not wear high heels or shoes that do not provide proper support. °· Check your mattress to see if it is too soft. A firm mattress may lessen your pain and discomfort. °SEEK IMMEDIATE MEDICAL CARE IF:  °· You lose control of your bowel or bladder (incontinence). °· You have increasing weakness in the lower back, pelvis, buttocks,   or legs.  You have redness or swelling of your back.  You have a burning sensation when you urinate.  You have pain that gets worse when you lie down or awakens you at night.  Your pain is worse than you have experienced in the past.  Your pain is lasting longer than 4 weeks.  You are suddenly losing weight without reason. MAKE SURE YOU:  Understand these  instructions.  Will watch your condition.  Will get help right away if you are not doing well or get worse. Document Released: 01/30/2001 Document Revised: 08/07/2011 Document Reviewed: 06/17/2011 [facility] Patient Information 2015 [place], [place]. This information is not intended to replace advice given to you by your health care provider. Make sure you discuss any questions you have with your health care provider.  Back Exercises Back exercises help treat and prevent back injuries. The goal of back exercises is to increase the strength of your abdominal and back muscles and the flexibility of your back. These exercises should be started when you no longer have back pain. Back exercises include:  Pelvic Tilt. Lie on your back with your knees bent. Tilt your pelvis until the lower part of your back is against the floor. Hold this position 5 to 10 sec and repeat 5 to 10 times.  Knee to Chest. Pull first 1 knee up against your chest and hold for 20 to 30 seconds, repeat this with the other knee, and then both knees. This may be done with the other leg straight or bent, whichever feels better.  Sit-Ups or Curl-Ups. Bend your knees 90 degrees. Start with tilting your pelvis, and do a partial, slow sit-up, lifting your trunk only 30 to 45 degrees off the floor. Take at least 2 to 3 seconds for each sit-up. Do not do sit-ups with your knees out straight. If partial sit-ups are difficult, simply do the above but with only tightening your abdominal muscles and holding it as directed.  Hip-Lift. Lie on your back with your knees flexed 90 degrees. Push down with your feet and shoulders as you raise your hips a couple inches off the floor; hold for 10 seconds, repeat 5 to 10 times.  Back arches. Lie on your stomach, propping yourself up on bent elbows. Slowly press on your hands, causing an arch in your low back. Repeat 3 to 5 times. Any initial stiffness and discomfort should lessen with repetition over  time.  Shoulder-Lifts. Lie face down with arms beside your body. Keep hips and torso pressed to floor as you slowly lift your head and shoulders off the floor. Do not overdo your exercises, especially in the beginning. Exercises may cause you some mild back discomfort which lasts for a few minutes; however, if the pain is more severe, or lasts for more than 15 minutes, do not continue exercises until you see your caregiver. Improvement with exercise therapy for back problems is slow.  See your caregivers for assistance with developing a proper back exercise program. Document Released: 03/15/2004 Document Revised: 04/30/2011 Document Reviewed: 12/07/2010 [facility] Patient Information 2015 [place], [place]. This information is not intended to replace advice given to you by your health care provider. Make sure you discuss any questions you have with your health care provider.

## 2014-07-01 NOTE — ED Notes (Signed)
Patient complains of L lower back pain that radiates to L buttock.  Patient denies injury.  Patient states has been going on for a couple of weeks.   Patient states has a PCP, but has been unable to go see him because insurance has lapsed.

## 2014-10-09 ENCOUNTER — Emergency Department (HOSPITAL_COMMUNITY)
Admission: EM | Admit: 2014-10-09 | Discharge: 2014-10-09 | Disposition: A | Discharge status: Home / Self Care | Payer: Medicaid Other | Attending: Emergency Medicine | Admitting: Emergency Medicine

## 2014-10-09 ENCOUNTER — Encounter (HOSPITAL_COMMUNITY): Payer: Self-pay | Admitting: *Deleted

## 2014-10-09 DIAGNOSIS — E119 Type 2 diabetes mellitus without complications: Secondary | ICD-10-CM | POA: Insufficient documentation

## 2014-10-09 DIAGNOSIS — M1000 Idiopathic gout, unspecified site: Secondary | ICD-10-CM

## 2014-10-09 DIAGNOSIS — M10071 Idiopathic gout, right ankle and foot: Secondary | ICD-10-CM | POA: Insufficient documentation

## 2014-10-09 DIAGNOSIS — I10 Essential (primary) hypertension: Secondary | ICD-10-CM | POA: Insufficient documentation

## 2014-10-09 DIAGNOSIS — Z79899 Other long term (current) drug therapy: Secondary | ICD-10-CM | POA: Insufficient documentation

## 2014-10-09 MED ORDER — INDOMETHACIN 25 MG PO CAPS: 25.0000 mg | ORAL_CAPSULE | Freq: Three times a day (TID) | ORAL | Status: DC | PRN

## 2014-10-09 NOTE — ED Notes (Signed)
Patient refused to allow vital signs

## 2014-10-09 NOTE — ED Notes (Signed)
Pt states he is having a gout flare-up and his dr's office is closed and he would like a med refill for indamethacine.  R leg swollen and tender on palpation.

## 2014-10-09 NOTE — Discharge Instructions (Signed)
Follow up with your md as planned °

## 2014-10-09 NOTE — ED Provider Notes (Signed)
CSN: [id number]     Arrival date & time 10/09/14  1800 History   First MD Initiated Contact with Patient 10/09/14 1946     Chief Complaint  Patient presents with  . Foot Pain     (Consider location/radiation/quality/duration/timing/severity/associated sxs/prior Treatment) Patient is a 49 y.o. male presenting with lower extremity pain. The history is provided by the patient (the pt complains of right foot pain.  states his gout is acting up.  needs indocin).  Foot Pain This is a recurrent problem. The current episode started yesterday. The problem occurs constantly. The problem has not changed since onset.Pertinent negatives include no chest pain, no abdominal pain and no headaches. Exacerbated by: movement. Nothing relieves the symptoms.    Past Medical History  Diagnosis Date  . Gout   . Diabetes mellitus   . Hypertension    History reviewed. No pertinent past surgical history. No family history on file. Social History  Substance Use Topics  . Smoking status: Never Smoker   . Smokeless tobacco: None  . Alcohol Use: No    Review of Systems  Constitutional: Negative for appetite change and fatigue.  HENT: Negative for congestion, ear discharge and sinus pressure.   Eyes: Negative for discharge.  Respiratory: Negative for cough.   Cardiovascular: Negative for chest pain.  Gastrointestinal: Negative for abdominal pain and diarrhea.  Genitourinary: Negative for frequency and hematuria.  Musculoskeletal: Negative for back pain.       Right foot pain  Skin: Negative for rash.  Neurological: Negative for seizures and headaches.  Psychiatric/Behavioral: Negative for hallucinations.      Allergies  Review of patient's allergies indicates no known allergies.  Home Medications   Prior to Admission medications   Medication Sig Start Date End Date Taking? Authorizing Provider  indomethacin (INDOCIN) 25 MG capsule Take 1 capsule (25 mg total) by mouth 3 (three) times daily as  needed. 10/09/14   Milton Ferguson, MD  lisinopril-hydrochlorothiazide (PRINZIDE,ZESTORETIC) 20-25 MG per tablet Take 1 tablet by mouth daily.     Historical Provider, MD  metFORMIN (GLUCOPHAGE) 500 MG tablet Take 500 mg by mouth 2 (two) times daily with a meal.     Historical Provider, MD  methocarbamol (ROBAXIN) 500 MG tablet Take 1 tablet (500 mg total) by mouth 2 (two) times daily as needed for muscle spasms. 07/01/14   Waynetta Pean, PA-C  Multiple Vitamin (MULTIVITAMIN WITH MINERALS) TABS Take 1 tablet by mouth daily.    Historical Provider, MD  naproxen (NAPROSYN) 500 MG tablet Take 1 tablet (500 mg total) by mouth 2 (two) times daily with a meal. 07/01/14   Waynetta Pean, PA-C  oxyCODONE-acetaminophen (PERCOCET/ROXICET) 5-325 MG per tablet Take 1 tablet by mouth every 4 (four) hours as needed. 10/26/13   Larene Pickett, PA-C   BP 156/99 mmHg  Pulse 96  Temp(Src) 98.1 F (36.7 C) (Oral)  Resp 16  Ht 6\' 4"  (1.93 m)  Wt 335 lb (151.955 kg)  BMI 40.79 kg/m2  SpO2 100% Physical Exam  Constitutional: He is oriented to person, place, and time. He appears well-developed.  HENT:  Head: Normocephalic.  Eyes: Conjunctivae are normal.  Neck: No tracheal deviation present.  Cardiovascular:  No murmur heard. Musculoskeletal: He exhibits tenderness.  Tender medial and lateral right ankle and foot  Neurological: He is oriented to person, place, and time.  Skin: Skin is warm.  Psychiatric: He has a normal mood and affect.    ED Course  Procedures (including critical care time)  Labs Review Labs Reviewed - No data to display  Imaging Review No results found. I have personally reviewed and evaluated these images and lab results as part of my medical decision-making.   EKG Interpretation None      MDM   Final diagnoses:  Acute idiopathic gout, unspecified site    Gout tx with indocin and follow up with pcp   Milton Ferguson, MD 10/09/14 [phone]

## 2014-11-21 ENCOUNTER — Encounter (HOSPITAL_COMMUNITY): Payer: Self-pay | Admitting: Emergency Medicine

## 2014-11-21 ENCOUNTER — Emergency Department (HOSPITAL_COMMUNITY)
Admission: EM | Admit: 2014-11-21 | Discharge: 2014-11-21 | Disposition: A | Discharge status: Home / Self Care | Payer: Commercial Managed Care - HMO | Attending: Emergency Medicine | Admitting: Emergency Medicine

## 2014-11-21 DIAGNOSIS — H578 Other specified disorders of eye and adnexa: Secondary | ICD-10-CM | POA: Diagnosis present

## 2014-11-21 DIAGNOSIS — M109 Gout, unspecified: Secondary | ICD-10-CM | POA: Insufficient documentation

## 2014-11-21 DIAGNOSIS — I10 Essential (primary) hypertension: Secondary | ICD-10-CM | POA: Diagnosis not present

## 2014-11-21 DIAGNOSIS — E119 Type 2 diabetes mellitus without complications: Secondary | ICD-10-CM | POA: Insufficient documentation

## 2014-11-21 DIAGNOSIS — H109 Unspecified conjunctivitis: Secondary | ICD-10-CM | POA: Diagnosis not present

## 2014-11-21 DIAGNOSIS — Z79899 Other long term (current) drug therapy: Secondary | ICD-10-CM | POA: Insufficient documentation

## 2014-11-21 DIAGNOSIS — H11421 Conjunctival edema, right eye: Secondary | ICD-10-CM | POA: Insufficient documentation

## 2014-11-21 MED ORDER — AZELASTINE HCL 0.05 % OP SOLN: 1.0000 [drp] | Freq: Two times a day (BID) | OPHTHALMIC | Status: DC

## 2014-11-21 MED ORDER — FLUORESCEIN SODIUM 1 MG OP STRP
1.0000 | ORAL_STRIP | Freq: Once | OPHTHALMIC | Status: AC
  Administered 2014-11-21: 1 via OPHTHALMIC
  Filled 2014-11-21: qty 1

## 2014-11-21 MED ORDER — KETOROLAC TROMETHAMINE 0.5 % OP SOLN
1.0000 [drp] | Freq: Once | OPHTHALMIC | Status: AC
  Administered 2014-11-21: 1 [drp] via OPHTHALMIC
  Filled 2014-11-21 (×2): qty 3

## 2014-11-21 MED ORDER — TETRACAINE HCL 0.5 % OP SOLN
2.0000 [drp] | Freq: Once | OPHTHALMIC | Status: AC
  Administered 2014-11-21: 2 [drp] via OPHTHALMIC
  Filled 2014-11-21: qty 2

## 2014-11-21 NOTE — ED Notes (Signed)
Pt sts pink sx x 2 days with redness and drainage with itching

## 2014-11-21 NOTE — ED Provider Notes (Signed)
CSN: [id number]     Arrival date & time 11/21/14  1029 History  By signing my name below, I, Meriel Pica, attest that this documentation has been prepared under the direction and in the presence of  Delsa Grana, PA-C. Electronically Signed: Meriel Pica, ED Scribe. 11/21/2014. 12:36 PM.  Chief Complaint  Patient presents with  . Conjunctivitis   The history is provided by the patient. No language interpreter was used.   HPI Comments: Patrick [name] is a 49 y.o. male, with a PMhx of HTN, DM, and gout, who presents to the Emergency Department complaining of constant right eye irritation and conjunctival injection onset 2 days ago with clear drainage from right eye, with associated foreign body sensation, itchiness.Marland Kitchen He notes acute worsening of symptoms with swelling to upper and lower lids onset this morning with worsening conjunctival swelling and redness. He also notes associated pain with blinking. Denies fevers or chills, rhinorrhea, sneezing, sore throat, cough, SOB, photophobia or visual disturbance. He denies any sick contacts.  Past Medical History  Diagnosis Date  . Gout   . Diabetes mellitus   . Hypertension    History reviewed. No pertinent past surgical history. History reviewed. No pertinent family history. Social History  Substance Use Topics  . Smoking status: Never Smoker   . Smokeless tobacco: None  . Alcohol Use: No    Review of Systems  Constitutional: Negative for fever and chills.  HENT: Negative for rhinorrhea, sneezing and sore throat.   Eyes: Positive for pain, discharge, redness and itching. Negative for photophobia and visual disturbance.  Respiratory: Negative for shortness of breath.    Allergies  Review of patient's allergies indicates no known allergies.  Home Medications   Prior to Admission medications   Medication Sig Start Date End Date Taking? Authorizing Provider  indomethacin (INDOCIN) 25 MG capsule Take 1 capsule (25 mg total) by mouth 3  (three) times daily as needed. 10/09/14   Milton Ferguson, MD  lisinopril-hydrochlorothiazide (PRINZIDE,ZESTORETIC) 20-25 MG per tablet Take 1 tablet by mouth daily.     Historical Provider, MD  metFORMIN (GLUCOPHAGE) 500 MG tablet Take 500 mg by mouth 2 (two) times daily with a meal.     Historical Provider, MD  methocarbamol (ROBAXIN) 500 MG tablet Take 1 tablet (500 mg total) by mouth 2 (two) times daily as needed for muscle spasms. 07/01/14   Waynetta Pean, PA-C  Multiple Vitamin (MULTIVITAMIN WITH MINERALS) TABS Take 1 tablet by mouth daily.    Historical Provider, MD  naproxen (NAPROSYN) 500 MG tablet Take 1 tablet (500 mg total) by mouth 2 (two) times daily with a meal. 07/01/14   Waynetta Pean, PA-C  oxyCODONE-acetaminophen (PERCOCET/ROXICET) 5-325 MG per tablet Take 1 tablet by mouth every 4 (four) hours as needed. 10/26/13   Larene Pickett, PA-C   BP 150/94 mmHg  Pulse 65  Temp(Src) 97.8 F (36.6 C) (Oral)  Resp 20  Ht 6\' 4"  (1.93 m)  Wt 350 lb 7 oz (158.957 kg)  BMI 42.67 kg/m2  SpO2 100% Physical Exam  Constitutional: He is oriented to person, place, and time. He appears well-developed and well-nourished. No distress.  HENT:  Head: Normocephalic.  Eyes: EOM are normal. Pupils are equal, round, and reactive to light. Lids are everted and swept, no foreign bodies found. Right eye exhibits chemosis and discharge. Right eye exhibits no exudate and no hordeolum. No foreign body present in the right eye. Left eye exhibits no discharge. Right conjunctiva is injected. Right conjunctiva has  no hemorrhage. Left conjunctiva is not injected. Left conjunctiva has no hemorrhage. No scleral icterus. Right eye exhibits normal extraocular motion and no nystagmus. Left eye exhibits normal extraocular motion and no nystagmus.  Slit lamp exam:      The right eye shows no corneal abrasion, no corneal ulcer, no foreign body and no fluorescein uptake.  Neck: Normal range of motion. Neck supple.   Cardiovascular: Normal rate.   Pulmonary/Chest: Effort normal. No respiratory distress.  Musculoskeletal: Normal range of motion.  Neurological: He is alert and oriented to person, place, and time. Coordination normal.  Skin: Skin is warm.  Psychiatric: He has a normal mood and affect. His behavior is normal.  Nursing note and vitals reviewed.   ED Course  Procedures  DIAGNOSTIC STUDIES: Oxygen Saturation is 100% on RA, normal by my interpretation.    COORDINATION OF CARE: 12:34 PM Discussed treatment plan with pt at bedside and pt agreed to plan.   MDM   Final diagnoses:  None    Allergic vs viral conjunctivitis with chemosis  Patient presentation consistent with viral vs allergic conjunctivitis.  No purulent discharge, corneal abrasions, entrapment, consensual photophobia, or dendritic staining with fluorescein study.  Presentation non-concerning for iritis, bacterial conjunctivitis, corneal abrasions, or HSV.  No antibiotics are indicated and patient will be prescribed azelastine for itching and ketorolac for discomfort and redness.  Personal hygiene and frequent handwashing discussed.   Patient verbalizes understanding and is agreeable with discharge.  Medications  tetracaine (PONTOCAINE) 0.5 % ophthalmic solution 2 drop (2 drops Right Eye Given 11/21/14 1328)  fluorescein ophthalmic strip 1 strip (1 strip Right Eye Given 11/21/14 1328)  ketorolac (ACULAR) 0.5 % ophthalmic solution 1 drop (1 drop Right Eye Given 11/21/14 1328)    I personally performed the services described in this documentation, which was scribed in my presence. The recorded information has been reviewed and is accurate.    Delsa Grana, PA-C 11/21/14 1659  Blanchie Dessert, MD 11/21/14 2142

## 2014-11-21 NOTE — Discharge Instructions (Signed)

## 2014-11-21 NOTE — ED Notes (Signed)
Declined W/C at D/C and was escorted to lobby by RN. 

## 2015-06-13 ENCOUNTER — Emergency Department (HOSPITAL_COMMUNITY): Payer: Commercial Managed Care - HMO

## 2015-06-13 ENCOUNTER — Encounter (HOSPITAL_COMMUNITY): Payer: Self-pay | Admitting: Emergency Medicine

## 2015-06-13 ENCOUNTER — Emergency Department (HOSPITAL_COMMUNITY)
Admission: EM | Admit: 2015-06-13 | Discharge: 2015-06-13 | Disposition: A | Discharge status: Home / Self Care | Payer: Commercial Managed Care - HMO | Attending: Emergency Medicine | Admitting: Emergency Medicine

## 2015-06-13 DIAGNOSIS — Y998 Other external cause status: Secondary | ICD-10-CM | POA: Diagnosis not present

## 2015-06-13 DIAGNOSIS — Y9289 Other specified places as the place of occurrence of the external cause: Secondary | ICD-10-CM | POA: Diagnosis not present

## 2015-06-13 DIAGNOSIS — Z791 Long term (current) use of non-steroidal anti-inflammatories (NSAID): Secondary | ICD-10-CM | POA: Insufficient documentation

## 2015-06-13 DIAGNOSIS — W2107XA Struck by softball, initial encounter: Secondary | ICD-10-CM | POA: Insufficient documentation

## 2015-06-13 DIAGNOSIS — Z7984 Long term (current) use of oral hypoglycemic drugs: Secondary | ICD-10-CM | POA: Diagnosis not present

## 2015-06-13 DIAGNOSIS — E119 Type 2 diabetes mellitus without complications: Secondary | ICD-10-CM | POA: Insufficient documentation

## 2015-06-13 DIAGNOSIS — Y9364 Activity, baseball: Secondary | ICD-10-CM | POA: Diagnosis not present

## 2015-06-13 DIAGNOSIS — Z79899 Other long term (current) drug therapy: Secondary | ICD-10-CM | POA: Diagnosis not present

## 2015-06-13 DIAGNOSIS — S4992XA Unspecified injury of left shoulder and upper arm, initial encounter: Secondary | ICD-10-CM | POA: Insufficient documentation

## 2015-06-13 DIAGNOSIS — Z8739 Personal history of other diseases of the musculoskeletal system and connective tissue: Secondary | ICD-10-CM | POA: Diagnosis not present

## 2015-06-13 DIAGNOSIS — I10 Essential (primary) hypertension: Secondary | ICD-10-CM | POA: Diagnosis not present

## 2015-06-13 DIAGNOSIS — M25512 Pain in left shoulder: Secondary | ICD-10-CM

## 2015-06-13 MED ORDER — NAPROXEN 500 MG PO TABS: 500.0000 mg | ORAL_TABLET | Freq: Two times a day (BID) | ORAL | Status: DC

## 2015-06-13 MED ORDER — HYDROCODONE-ACETAMINOPHEN 5-325 MG PO TABS
1.0000 | ORAL_TABLET | Freq: Once | ORAL | Status: AC
  Administered 2015-06-13: 1 via ORAL
  Filled 2015-06-13: qty 1

## 2015-06-13 MED ORDER — HYDROCODONE-ACETAMINOPHEN 5-325 MG PO TABS
1.0000 | ORAL_TABLET | ORAL | Status: DC | PRN
Start: 2015-06-13 — End: 2017-04-19

## 2015-06-13 NOTE — ED Notes (Signed)
Ice applied to L shoulder

## 2015-06-13 NOTE — ED Provider Notes (Signed)
CSN: [id number]     Arrival date & time 06/13/15  0011 History   First MD Initiated Contact with Patient 06/13/15 0014     Chief Complaint  Patient presents with  . Shoulder Pain     (Consider location/radiation/quality/duration/timing/severity/associated sxs/prior Treatment) HPI Comments: Patient presents with acute onset of left shoulder pain starting yesterday. The day prior patient was at a batting cage and did an extensive amount of hitting. Pain is gradually worsened since that time. No numbness or tingling. No neck pain. Patient is unable to lift his arm above shoulder height. Course is constant. Pain is made worse with movement and palpation. Patient has taken Tylenol without relief.  Patient does have a history of high blood pressure, typically well controlled on lisinopril-HCTZ.   Patient is a 50 y.o. male presenting with shoulder pain. The history is provided by the patient.  Shoulder Pain Associated symptoms: no back pain and no neck pain     Past Medical History  Diagnosis Date  . Gout   . Diabetes mellitus   . Hypertension    History reviewed. No pertinent past surgical history. History reviewed. No pertinent family history. Social History  Substance Use Topics  . Smoking status: Never Smoker   . Smokeless tobacco: None  . Alcohol Use: No    Review of Systems  Constitutional: Negative for activity change.  Musculoskeletal: Positive for arthralgias. Negative for back pain, joint swelling, gait problem and neck pain.  Skin: Negative for wound.  Neurological: Negative for weakness and numbness.      Allergies  Review of patient's allergies indicates no known allergies.  Home Medications   Prior to Admission medications   Medication Sig Start Date End Date Taking? Authorizing Provider  azelastine (OPTIVAR) 0.05 % ophthalmic solution Place 1 drop into both eyes 2 (two) times daily. 11/21/14   Delsa Grana, PA-C  indomethacin (INDOCIN) 25 MG capsule Take 1  capsule (25 mg total) by mouth 3 (three) times daily as needed. 10/09/14   Milton Ferguson, MD  lisinopril-hydrochlorothiazide (PRINZIDE,ZESTORETIC) 20-25 MG per tablet Take 1 tablet by mouth daily.     Historical Provider, MD  metFORMIN (GLUCOPHAGE) 500 MG tablet Take 500 mg by mouth 2 (two) times daily with a meal.     Historical Provider, MD  methocarbamol (ROBAXIN) 500 MG tablet Take 1 tablet (500 mg total) by mouth 2 (two) times daily as needed for muscle spasms. 07/01/14   Waynetta Pean, PA-C  Multiple Vitamin (MULTIVITAMIN WITH MINERALS) TABS Take 1 tablet by mouth daily.    Historical Provider, MD  naproxen (NAPROSYN) 500 MG tablet Take 1 tablet (500 mg total) by mouth 2 (two) times daily with a meal. 07/01/14   Waynetta Pean, PA-C  oxyCODONE-acetaminophen (PERCOCET/ROXICET) 5-325 MG per tablet Take 1 tablet by mouth every 4 (four) hours as needed. 10/26/13   Larene Pickett, PA-C   BP 172/95 mmHg  Pulse 89  Temp(Src) 98.1 F (36.7 C) (Oral)  Resp 16  Ht 6\' 4"  (1.93 m)  Wt 149.687 kg  BMI 40.19 kg/m2  SpO2 99% Physical Exam  Constitutional: He appears well-developed and well-nourished.  HENT:  Head: Normocephalic and atraumatic.  Eyes: Conjunctivae are normal.  Neck: Normal range of motion. Neck supple.  Cardiovascular: Normal pulses.   Pulses:      Radial pulses are 2+ on the left side.  Musculoskeletal: He exhibits tenderness. He exhibits no edema.       Left shoulder: He exhibits decreased range of motion (  decreased abduction), tenderness, bony tenderness and spasm. He exhibits no deformity.       Left elbow: Normal.       Cervical back: Normal.       Left upper arm: Normal.  Neurological: He is alert. No sensory deficit.  Motor, sensation, and vascular distal to the injury is fully intact.   Skin: Skin is warm and dry.  Psychiatric: He has a normal mood and affect.  Nursing note and vitals reviewed.   ED Course  Procedures (including critical care time)  Imaging  Review Dg Shoulder Left  06/13/2015  CLINICAL DATA:  Left shoulder pain since Friday after softball injury. Initial encounter. EXAM: LEFT SHOULDER - 2+ VIEW COMPARISON:  None. FINDINGS: Scapular Y-view is limited by motion blurring. No evidence of acute fracture or dislocation. Advanced glenohumeral osteoarthritis with spurring and joint narrowing. AC joint upward spurring. IMPRESSION: No acute finding. Advanced glenohumeral osteoarthritis. Electronically Signed   By: Monte Fantasia M.D.   On: 06/13/2015 01:11   I have personally reviewed and evaluated these images and lab results as part of my medical decision-making.    2:51 AM Patient seen and examined.   Vital signs reviewed and are as follows: BP 172/95 mmHg  Pulse 89  Temp(Src) 98.1 F (36.7 C) (Oral)  Resp 16  Ht 6\' 4"  (1.93 m)  Wt 149.687 kg  BMI 40.19 kg/m2  SpO2 99%  Informed of x-ray results. Will give sling, pain medication for home. Encouraged follow-up with his orthopedist this coming week for further evaluation. Patient verbalizes understanding and agrees with plan.  Patient counseled on use of narcotic pain medications. Counseled not to combine these medications with others containing tylenol. Urged not to drink alcohol, drive, or perform any other activities that requires focus while taking these medications. The patient verbalizes understanding and agrees with the plan.   MDM   Final diagnoses:  Left shoulder pain   Patient with L shoulder pain after hitting softballs. No fracture or dislocation. Distal CMS intact. Conservative measures with sore throat follow-up indicated at this time. No concern for atypical ACS as his pain is reproducible and worsened with movement.   Carlisle Cater, PA-C 06/13/15 [place], MD 06/13/15 [phone]

## 2015-06-13 NOTE — Discharge Instructions (Signed)
Please read and follow all provided instructions.  Your diagnoses today include:  1. Left shoulder pain     Tests performed today include:  An x-ray of the affected area - does NOT show any broken bones  Vital signs. See below for your results today.   Medications prescribed:   Vicodin (hydrocodone/acetaminophen) - narcotic pain medication  DO NOT drive or perform any activities that require you to be awake and alert because this medicine can make you drowsy. BE VERY CAREFUL not to take multiple medicines containing Tylenol (also called acetaminophen). Doing so can lead to an overdose which can damage your liver and cause liver failure and possibly death.   Naproxen - anti-inflammatory pain medication  Do not exceed 500mg  naproxen every 12 hours, take with food  You have been prescribed an anti-inflammatory medication or NSAID. Take with food. Take smallest effective dose for the shortest duration needed for your pain. Stop taking if you experience stomach pain or vomiting.   Take any prescribed medications only as directed.  Home care instructions:   Follow any educational materials contained in this packet  Follow R.I.C.E. Protocol:  R - rest your injury   I  - use ice on injury without applying directly to skin  C - compress injury with bandage or splint  E - elevate the injury as much as possible  Follow-up instructions: Please follow-up with your primary care provider or the provided orthopedic physician (bone specialist) if you continue to have significant pain in 1 week. In this case you may have a more severe injury that requires further care.   Return instructions:   Please return if your fingers are numb or tingling, appear gray or blue, or you have severe pain (also elevate the arm and loosen splint or wrap if you were given one)  Please return to the Emergency Department if you experience worsening symptoms.   Please return if you have any other emergent  concerns.  Additional Information:  Your vital signs today were: BP 172/95 mmHg   Pulse 89   Temp(Src) 98.1 F (36.7 C) (Oral)   Resp 16   Ht 6\' 4"  (1.93 m)   Wt 149.687 kg   BMI 40.19 kg/m2   SpO2 99% If your blood pressure (BP) was elevated above 135/85 this visit, please have this repeated by your doctor within one month. --------------

## 2015-06-13 NOTE — ED Notes (Signed)
Patient here with complaint of left shoulder pain after hitting softballs on Friday night. 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 Pain wasn't there right after the exercise, started on Saturday morning. ROM impaired in arm actively and passively.

## 2015-12-09 ENCOUNTER — Telehealth: Payer: Self-pay | Admitting: *Deleted

## 2016-10-11 ENCOUNTER — Emergency Department (HOSPITAL_COMMUNITY): Payer: Self-pay

## 2016-10-11 ENCOUNTER — Emergency Department (HOSPITAL_COMMUNITY)
Admission: EM | Admit: 2016-10-11 | Discharge: 2016-10-11 | Disposition: A | Discharge status: Home / Self Care | Payer: Self-pay | Attending: Emergency Medicine | Admitting: Emergency Medicine

## 2016-10-11 ENCOUNTER — Encounter (HOSPITAL_COMMUNITY): Payer: Self-pay

## 2016-10-11 DIAGNOSIS — Z79899 Other long term (current) drug therapy: Secondary | ICD-10-CM | POA: Insufficient documentation

## 2016-10-11 DIAGNOSIS — E119 Type 2 diabetes mellitus without complications: Secondary | ICD-10-CM | POA: Insufficient documentation

## 2016-10-11 DIAGNOSIS — I10 Essential (primary) hypertension: Secondary | ICD-10-CM | POA: Insufficient documentation

## 2016-10-11 DIAGNOSIS — Z7984 Long term (current) use of oral hypoglycemic drugs: Secondary | ICD-10-CM | POA: Insufficient documentation

## 2016-10-11 DIAGNOSIS — J069 Acute upper respiratory infection, unspecified: Secondary | ICD-10-CM | POA: Insufficient documentation

## 2016-10-11 MED ORDER — BENZONATATE 100 MG PO CAPS
100.0000 mg | ORAL_CAPSULE | Freq: Once | ORAL | Status: AC
  Administered 2016-10-11: 100 mg via ORAL
  Filled 2016-10-11: qty 1

## 2016-10-11 MED ORDER — AZITHROMYCIN 250 MG PO TABS: 250.0000 mg | ORAL_TABLET | Freq: Every day | ORAL | 0 refills | Status: DC

## 2016-10-11 MED ORDER — BENZONATATE 100 MG PO CAPS
100.0000 mg | ORAL_CAPSULE | Freq: Three times a day (TID) | ORAL | 0 refills | Status: DC | PRN
Start: 2016-10-11 — End: 2017-04-19

## 2016-10-11 NOTE — Discharge Instructions (Signed)
It was my pleasure taking care of you today!   Please take all of your antibiotics until finished! Tessalon as needed for cough. Alternate between Tylenol or ibuprofen as needed for body aches.  Rest, drink plenty of fluids to be sure you are staying hydrated.   Please follow up with your primary doctor for discussion of your diagnoses and further evaluation after today's visit if symptoms persist longer than 7 days; Return to the ER for high fevers, difficulty breathing or other concerning symptoms

## 2016-10-11 NOTE — ED Provider Notes (Signed)
[place] DEPT Provider Note   CSN: [id number] Arrival date & time: 10/11/16  1921     History   Chief Complaint Chief Complaint  Patient presents with  . Cough    HPI Patrick [name] is a 51 y.o. male.  The history is provided by the patient and medical records. No language interpreter was used.  Cough  Associated symptoms include sore throat. Pertinent negatives include no chest pain, no chills, no shortness of breath and no wheezing.   Patrick [name] is a 51 y.o. male  with a PMH of DM, HTN, gout who presents to the Emergency Department complaining of persistent cough over the last 4-5 weeks, intermittently productive. Over the last 3-4 days, cough has been persistently productive and associated with nasal congestion and sore throat. Tried Dayquil, Nyquil with no improvement. No sick contacts. No fever or chills. No shortness of breath, chest pain, abdominal pain, n/v/d.   Past Medical History:  Diagnosis Date  . Diabetes mellitus   . Gout   . Hypertension     Patient Active Problem List   Diagnosis Date Noted  . GOUT 01/27/2010  . OBESITY 01/27/2010  . DIABETES MELLITUS, TYPE II 02/20/2008  . HYPERTENSION, BENIGN ESSENTIAL 02/20/2008    History reviewed. No pertinent surgical history.     Home Medications    Prior to Admission medications   Medication Sig Start Date End Date Taking? Authorizing Provider  azelastine (OPTIVAR) 0.05 % ophthalmic solution Place 1 drop into both eyes 2 (two) times daily. 11/21/14   Delsa Grana, PA-C  azithromycin (ZITHROMAX) 250 MG tablet Take 1 tablet (250 mg total) by mouth daily. Take first 2 tablets together, then 1 every day until finished. 10/11/16   Patrick [name], Ozella Almond, PA-C  benzonatate (TESSALON) 100 MG capsule Take 1 capsule (100 mg total) by mouth 3 (three) times daily as needed for cough. 10/11/16   Patrick [name], Ozella Almond, PA-C  HYDROcodone-acetaminophen (NORCO/VICODIN) 5-325 MG tablet Take 1-2 tablets by mouth every 4 (four)  hours as needed. 06/13/15   Carlisle Cater, PA-C  indomethacin (INDOCIN) 25 MG capsule Take 1 capsule (25 mg total) by mouth 3 (three) times daily as needed. 10/09/14   Milton Ferguson, MD  lisinopril-hydrochlorothiazide (PRINZIDE,ZESTORETIC) 20-25 MG per tablet Take 1 tablet by mouth daily.     [provider]  metFORMIN (GLUCOPHAGE) 500 MG tablet Take 500 mg by mouth 2 (two) times daily with a meal.     [provider]  methocarbamol (ROBAXIN) 500 MG tablet Take 1 tablet (500 mg total) by mouth 2 (two) times daily as needed for muscle spasms. 07/01/14   Waynetta Pean, PA-C  Multiple Vitamin (MULTIVITAMIN WITH MINERALS) TABS Take 1 tablet by mouth daily.    [provider]  naproxen (NAPROSYN) 500 MG tablet Take 1 tablet (500 mg total) by mouth 2 (two) times daily. 06/13/15   Carlisle Cater, PA-C    Family History History reviewed. No pertinent family history.  Social History Social History  Substance Use Topics  . Smoking status: Never Smoker  . Smokeless tobacco: Not on file  . Alcohol use No     Allergies   Patient has no known allergies.   Review of Systems Review of Systems  Constitutional: Negative for chills and fever.  HENT: Positive for congestion, sinus pressure and sore throat.   Respiratory: Positive for cough. Negative for shortness of breath and wheezing.   Cardiovascular: Negative for chest pain.  Gastrointestinal: Negative for abdominal pain, diarrhea, nausea and  vomiting.     Physical Exam Updated Vital Signs BP 120/78 (BP Location: Right Arm)   Pulse (!) 129   Temp 99.1 F (37.3 C) (Oral)   Resp 20   Ht 6\' 4"  (1.93 m)   Wt (!) 149.7 kg (330 lb)   SpO2 100%   BMI 40.17 kg/m   Physical Exam  Constitutional: He appears well-developed and well-nourished. No distress.  HENT:  Head: Normocephalic and atraumatic.  OP with erythema, no exudates or tonsillar hypertrophy. No focal areas of sinus tenderness.  Neck: Neck supple.    Cardiovascular: Regular rhythm and normal heart sounds.   No murmur heard. Pulmonary/Chest: Effort normal and breath sounds normal. No respiratory distress. He has no wheezes. He has no rales.  Musculoskeletal: Normal range of motion.  Neurological: He is alert.  Skin: Skin is warm and dry.  Nursing note and vitals reviewed.    ED Treatments / Results  Labs (all labs ordered are listed, but only abnormal results are displayed) Labs Reviewed - No data to display  EKG  EKG Interpretation None       Radiology Dg Chest 2 View  Result Date: 10/11/2016 CLINICAL DATA:  Cough x2 months. EXAM: CHEST  2 VIEW COMPARISON:  None. FINDINGS: The heart size and mediastinal contours are within normal limits. Mild interstitial prominence is seen bilaterally which may reflect chronic bronchitic change. No pneumonic consolidation, effusion or pneumothorax. The visualized skeletal structures are unremarkable. IMPRESSION: Mild interstitial prominence bilaterally which may reflect chronic bronchitic change. No pneumonia. Electronically Signed   By: Ashley Royalty M.D.   On: 10/11/2016 20:08    Procedures Procedures (including critical care time)  Medications Ordered in ED Medications  benzonatate (TESSALON) capsule 100 mg (not administered)     Initial Impression / Assessment and Plan / ED Course  I have reviewed the triage vital signs and the nursing notes.  Pertinent labs & imaging results that were available during my care of the patient were reviewed by me and considered in my medical decision making (see chart for details).    Patrick [name] is a 51 y.o. male who presents to ED for persistent cough x 1 month which turned productive over the last 3-4 days. Recently started having sore throat, congestion and sinus pressure as well. No acute process on CXR and lungs CTA bilaterally. Follow up with PCP strongly encouraged. Rx for tessalon and zithromax given. Reasons to return to ER discussed and all  questions answered.    Final Clinical Impressions(s) / ED Diagnoses   Final diagnoses:  Upper respiratory tract infection, unspecified type    New Prescriptions New Prescriptions   AZITHROMYCIN (ZITHROMAX) 250 MG TABLET    Take 1 tablet (250 mg total) by mouth daily. Take first 2 tablets together, then 1 every day until finished.   BENZONATATE (TESSALON) 100 MG CAPSULE    Take 1 capsule (100 mg total) by mouth 3 (three) times daily as needed for cough.     Mattilyn Crites, Ozella Almond, PA-C 10/11/16 2047    Charlesetta Shanks, MD 10/17/16 [phone]

## 2016-10-11 NOTE — ED Triage Notes (Signed)
Pt endorses cough x 1 month with occasional clear sputum. VSS. Afebrile.

## 2016-11-07 NOTE — Telephone Encounter (Signed)
Entered in error

## 2017-04-19 ENCOUNTER — Other Ambulatory Visit: Payer: Self-pay

## 2017-04-19 ENCOUNTER — Encounter (HOSPITAL_COMMUNITY): Payer: Self-pay | Admitting: Emergency Medicine

## 2017-04-19 ENCOUNTER — Ambulatory Visit (HOSPITAL_COMMUNITY)
Admission: EM | Admit: 2017-04-19 | Discharge: 2017-04-19 | Disposition: A | Discharge status: Home / Self Care | Payer: 59 | Attending: Internal Medicine | Admitting: Internal Medicine

## 2017-04-19 DIAGNOSIS — M545 Low back pain, unspecified: Secondary | ICD-10-CM

## 2017-04-19 DIAGNOSIS — M25532 Pain in left wrist: Secondary | ICD-10-CM

## 2017-04-19 MED ORDER — METHOCARBAMOL 500 MG PO TABS: 500.0000 mg | ORAL_TABLET | Freq: Two times a day (BID) | ORAL | 0 refills | Status: DC | PRN

## 2017-04-19 MED ORDER — KETOROLAC TROMETHAMINE 60 MG/2ML IM SOLN
INTRAMUSCULAR | Status: AC
  Filled 2017-04-19: qty 2

## 2017-04-19 MED ORDER — HYDROCODONE-ACETAMINOPHEN 5-325 MG PO TABS: 1.0000 | ORAL_TABLET | Freq: Four times a day (QID) | ORAL | 0 refills | Status: DC | PRN

## 2017-04-19 MED ORDER — KETOROLAC TROMETHAMINE 60 MG/2ML IM SOLN
60.0000 mg | Freq: Once | INTRAMUSCULAR | Status: AC
  Administered 2017-04-19: 60 mg via INTRAMUSCULAR

## 2017-04-19 MED ORDER — INDOMETHACIN 25 MG PO CAPS: 25.0000 mg | ORAL_CAPSULE | Freq: Three times a day (TID) | ORAL | 0 refills | Status: DC | PRN

## 2017-04-19 NOTE — ED Triage Notes (Signed)
Pt reports right lower back pain x2 days with out radiation and left recurrent wrist pain x1 year.

## 2017-04-19 NOTE — Discharge Instructions (Addendum)
You were given a shot of Toradol 60mg  today to help with pain and inflammation. May take Indomethacin 25mg  every 8 hours as needed for pain. May use Robaxin 500mg  twice a day as needed for muscle pain/spasms- will cause drowsiness. If pain still is severe, may take Vicodin 1-2 tablets every 6 hours as needed. Continue to apply heat to area for comfort. Follow-up with your Orthopedic next week if symptoms do not improve.

## 2017-04-19 NOTE — ED Notes (Signed)
Bed: UC03 Expected date:  Expected time:  Means of arrival:  Comments: 

## 2017-04-20 NOTE — ED Provider Notes (Signed)
[facility]    CSN: [id number] Arrival date & time: 04/19/17  1036     History   Chief Complaint Chief Complaint  Patient presents with  . Wrist Pain    left  . Back Pain    right lower    HPI Arif Amendola is a 52 y.o. male.   52 year old male presents with right lower back pain for the past 2 days. No current radiation of pain but has history of recurrent lower back pain (both sides)/sciatica in the past. No distinct injury but has been working out more at [employer] in the past month. Has taken Aleve and used heat with some relief. Has taken NDSAIDs, muscle relaxers and opoid medication in the past with good success. Also complains of left wrist pain that is intermittent for the past year. No distinct injury but has noticed possible nodular formations on his ulnar bone. Has seen an Orthopedic in the past but not recently. Also has history of gout and has taken Indomethacin with good success. Other chronic health issues include HTN, DM, and seasonal allergies and takes Hyzaar, Metformin and Optivar for management.    The history is provided by the patient.    Past Medical History:  Diagnosis Date  . Diabetes mellitus   . Gout   . Hypertension     Patient Active Problem List   Diagnosis Date Noted  . GOUT 01/27/2010  . OBESITY 01/27/2010  . DIABETES MELLITUS, TYPE II 02/20/2008  . HYPERTENSION, BENIGN ESSENTIAL 02/20/2008    History reviewed. No pertinent surgical history.     Home Medications    Prior to Admission medications   Medication Sig Start Date End Date Taking? Authorizing Provider  losartan-hydrochlorothiazide (HYZAAR) 50-12.5 MG tablet Take 1 tablet by mouth daily.   Yes [provider]  metFORMIN (GLUCOPHAGE) 500 MG tablet Take 500 mg by mouth 2 (two) times daily with a meal.    Yes [provider]  azelastine (OPTIVAR) 0.05 % ophthalmic solution Place 1 drop into both eyes 2 (two) times daily. 11/21/14   Delsa Grana, PA-C    HYDROcodone-acetaminophen (NORCO/VICODIN) 5-325 MG tablet Take 1-2 tablets by mouth every 6 (six) hours as needed for severe pain. 04/19/17   Katy Apo, NP  indomethacin (INDOCIN) 25 MG capsule Take 1 capsule (25 mg total) by mouth 3 (three) times daily as needed for moderate pain. 04/19/17   Katy Apo, NP  methocarbamol (ROBAXIN) 500 MG tablet Take 1 tablet (500 mg total) by mouth 2 (two) times daily as needed for muscle spasms. 04/19/17   Katy Apo, NP  Multiple Vitamin (MULTIVITAMIN WITH MINERALS) TABS Take 1 tablet by mouth daily.    [provider]    Family History History reviewed. No pertinent family history.  Social History Social History   Tobacco Use  . Smoking status: Never Smoker  . Smokeless tobacco: Never Used  Substance Use Topics  . Alcohol use: No  . Drug use: No     Allergies   Patient has no known allergies.   Review of Systems Review of Systems  Constitutional: Negative for activity change, appetite change, chills, fatigue and fever.  Eyes: Negative for photophobia and visual disturbance.  Respiratory: Negative for cough, chest tightness, shortness of breath and wheezing.   Gastrointestinal: Negative for diarrhea, nausea and vomiting.  Genitourinary: Negative for decreased urine volume, difficulty urinating, dysuria, flank pain, frequency, hematuria and urgency.  Musculoskeletal: Positive for arthralgias, back pain,  joint swelling and myalgias. Negative for neck pain and neck stiffness.  Skin: Negative for color change, rash and wound.  Allergic/Immunologic: Positive for environmental allergies.  Neurological: Negative for dizziness, tremors, seizures, syncope, weakness, light-headedness, numbness and headaches.  Hematological: Negative for adenopathy. Does not bruise/bleed easily.  Psychiatric/Behavioral: Negative.      Physical Exam Triage Vital Signs ED Triage Vitals  Enc Vitals Group     BP 04/19/17 1132 (!) 137/93      Pulse Rate 04/19/17 1132 (!) 109     Resp --      Temp 04/19/17 1132 98.6 F (37 C)     Temp Source 04/19/17 1132 Oral     SpO2 04/19/17 1132 100 %     Weight --      Height --      Head Circumference --      Peak Flow --      Pain Score 04/19/17 1129 10     Pain Loc --      Pain Edu? --      Excl. in [place]? --    No data found.  Updated Vital Signs BP (!) 137/93 (BP Location: Left Arm)   Pulse (!) 109   Temp 98.6 F (37 C) (Oral)   SpO2 100%   Visual Acuity Right Eye Distance:   Left Eye Distance:   Bilateral Distance:    Right Eye Near:   Left Eye Near:    Bilateral Near:     Physical Exam  Constitutional: He is oriented to person, place, and time. He appears well-developed and well-nourished. No distress.  Patient sitting on exam table but appears in pain  HENT:  Head: Normocephalic and atraumatic.  Right Ear: External ear normal.  Left Ear: External ear normal.  Eyes: Conjunctivae and EOM are normal.  Neck: Normal range of motion. Neck supple.  Cardiovascular: Normal rate, regular rhythm and normal heart sounds.  No murmur heard. Pulse has decreased to 88 now  Pulmonary/Chest: Effort normal and breath sounds normal. No stridor. No respiratory distress. He has no decreased breath sounds. He has no wheezes. He has no rhonchi.  Musculoskeletal: He exhibits tenderness.       Left wrist: He exhibits tenderness and swelling. He exhibits normal range of motion and no effusion.       Lumbar back: He exhibits decreased range of motion, tenderness, pain and spasm. He exhibits no swelling, no edema and normal pulse.       Back:       Arms: Decreased range of motion of back, especially with flexion and rotation. Tender in right lower lumbar area. Muscle tightness and spasms present. SLR positive on right side. Reflexes are normal and no neuro deficits noted.   Full range of motion of left wrist but pain with movement. Tender along ulnar area at distal area of ulnar bone.  Slight swelling present. No redness. Good strength and pulses. No numbness or neuro deficits noted.   Neurological: He is alert and oriented to person, place, and time. He has normal strength and normal reflexes. No sensory deficit.  Skin: Skin is warm and dry. Capillary refill takes less than 2 seconds. No rash noted.  Psychiatric: He has a normal mood and affect. His behavior is normal. Judgment and thought content normal.     UC Treatments / Results  Labs (all labs ordered are listed, but only abnormal results are displayed) Labs Reviewed - No data to display  EKG  EKG Interpretation  None       Radiology No results found.  Procedures Procedures (including critical care time)  Medications Ordered in UC Medications  ketorolac (TORADOL) injection 60 mg (60 mg Intramuscular Given 04/19/17 1244)     Initial Impression / Assessment and Plan / UC Course  I have reviewed the triage vital signs and the nursing notes.  Pertinent labs & imaging results that were available during my care of the patient were reviewed by me and considered in my medical decision making (see chart for details).    Discussed that he probably has a recurrent lumbar back strain. Also discussed that he may have arthritis or other musculoskeletal disorder in his wrist/forearm. Gave Toradol 60mg  IM now for pain and inflammation. May take Indomethacin 25mg  every 8 hours as needed for pain. May use Robaxin 500mg  twice a day as needed for muscle pain/spasms- will cause drowsiness. If pain still is severe, may take Vicodin 1-2 tablets every 6 hours as needed #10 no refill. Continue to apply heat to area for comfort. Follow-up with his Orthopedic next week if symptoms do not improve or go to ER if symptoms worsen.    Final Clinical Impressions(s) / UC Diagnoses   Final diagnoses:  Acute right-sided low back pain without sciatica  Left wrist pain    ED Discharge Orders        Ordered    indomethacin (INDOCIN)  25 MG capsule  3 times daily PRN     04/19/17 1236    methocarbamol (ROBAXIN) 500 MG tablet  2 times daily PRN     04/19/17 1236    HYDROcodone-acetaminophen (NORCO/VICODIN) 5-325 MG tablet  Every 6 hours PRN     04/19/17 1237       Controlled Substance Prescriptions [place] Controlled Substance Registry consulted? Yes, I have consulted the [place] Controlled Substances Registry for this patient, and feel the risk/benefit ratio today is favorable for proceeding with this prescription for a controlled substance. Last active Rx was for Hydrocodone in April 2017. No other active Rx.    Katy Apo, NP 04/20/17 1012

## 2017-05-03 ENCOUNTER — Encounter (HOSPITAL_COMMUNITY): Payer: Self-pay | Admitting: Emergency Medicine

## 2017-05-03 ENCOUNTER — Ambulatory Visit (HOSPITAL_COMMUNITY)
Admission: EM | Admit: 2017-05-03 | Discharge: 2017-05-03 | Disposition: A | Discharge status: Home / Self Care | Payer: 59 | Attending: Family Medicine | Admitting: Family Medicine

## 2017-05-03 DIAGNOSIS — M25562 Pain in left knee: Secondary | ICD-10-CM

## 2017-05-03 DIAGNOSIS — M25561 Pain in right knee: Secondary | ICD-10-CM

## 2017-05-03 DIAGNOSIS — M5431 Sciatica, right side: Secondary | ICD-10-CM | POA: Diagnosis not present

## 2017-05-03 MED ORDER — HYDROCODONE-ACETAMINOPHEN 5-325 MG PO TABS: 1.0000 | ORAL_TABLET | Freq: Four times a day (QID) | ORAL | 0 refills | Status: DC | PRN

## 2017-05-03 MED ORDER — PREDNISONE 20 MG PO TABS: ORAL_TABLET | ORAL | 0 refills | Status: DC

## 2017-05-03 NOTE — ED Triage Notes (Signed)
PT was seen a few weeks ago for sciatica.   PT reports right leg pain that worsened yesterday. PT sees a Restaurant manager, fast food.

## 2017-05-03 NOTE — ED Provider Notes (Signed)
[place]   [id number] 05/03/17 Arrival Time: 6384   SUBJECTIVE:  Patrick [name] is a 52 y.o. male who presents to the urgent care with complaint of chronic low back pain with recurrent sciatica over past two weeks.  He also c/o knee pain since this started.  He works in [employer].  He knows that he needs to strengthen his core.     Past Medical History:  Diagnosis Date  . Diabetes mellitus   . Gout   . Hypertension    No family history on file. Social History   Socioeconomic History  . Marital status: Married    Spouse name: Not on file  . Number of children: Not on file  . Years of education: Not on file  . Highest education level: Not on file  Social Needs  . Financial resource strain: Not on file  . Food insecurity - worry: Not on file  . Food insecurity - inability: Not on file  . Transportation needs - medical: Not on file  . Transportation needs - non-medical: Not on file  Occupational History  . Not on file  Tobacco Use  . Smoking status: Never Smoker  . Smokeless tobacco: Never Used  Substance and Sexual Activity  . Alcohol use: No  . Drug use: No  . Sexual activity: Not on file  Other Topics Concern  . Not on file  Social History Narrative  . Not on file   Current Meds  Medication Sig  . [DISCONTINUED] HYDROcodone-acetaminophen (NORCO/VICODIN) 5-325 MG tablet Take 1-2 tablets by mouth every 6 (six) hours as needed for severe pain.   No Known Allergies    ROS: As per HPI, remainder of ROS negative.   OBJECTIVE:   Vitals:   05/03/17 1538 05/03/17 1539  BP:  134/83  Pulse:  (!) 102  Resp:  16  Temp:  98.1 F (36.7 C)  TempSrc:  Oral  SpO2:  97%  Weight: (!) 310 lb (140.6 kg)      General appearance: alert; no distress Eyes: PERRL; EOMI; conjunctiva normal HENT: normocephalic; atraumatic; TMs normal, canal normal, external ears normal without trauma; nasal mucosa normal; oral mucosa normal Neck: supple Lungs:  clear to auscultation bilaterally Heart: regular rate and rhythm Abdomen: soft, non-tender; bowel sounds normal; no masses or organomegaly; no guarding or rebound tenderness Back: no CVA tenderness Extremities: no cyanosis or edema; symmetrical with no gross deformities Skin: warm and dry Neurologic: normal gait; grossly normal Psychological: alert and cooperative; normal mood and affect      Labs:  Results for orders placed or performed during the hospital encounter of 10/26/13  CBC with Differential  Result Value Ref Range   WBC 5.3 4.0 - 10.5 K/uL   RBC 4.32 4.22 - 5.81 MIL/uL   Hemoglobin 11.9 (L) 13.0 - 17.0 g/dL   HCT 36.4 (L) 39.0 - 52.0 %   MCV 84.3 78.0 - 100.0 fL   MCH 27.5 26.0 - 34.0 pg   MCHC 32.7 30.0 - 36.0 g/dL   RDW 14.1 11.5 - 15.5 %   Platelets 273 150 - 400 K/uL   Neutrophils Relative % 53 43 - 77 %   Neutro Abs 2.8 1.7 - 7.7 K/uL   Lymphocytes Relative 39 12 - 46 %   Lymphs Abs 2.1 0.7 - 4.0 K/uL   Monocytes Relative 5 3 - 12 %   Monocytes Absolute 0.3 0.1 - 1.0 K/uL   Eosinophils Relative 3 0 - 5 %  Eosinophils Absolute 0.2 0.0 - 0.7 K/uL   Basophils Relative 0 0 - 1 %   Basophils Absolute 0.0 0.0 - 0.1 K/uL  Comprehensive metabolic panel  Result Value Ref Range   Sodium 138 137 - 147 mEq/L   Potassium 4.1 3.7 - 5.3 mEq/L   Chloride 101 96 - 112 mEq/L   CO2 26 19 - 32 mEq/L   Glucose, Bld 194 (H) 70 - 99 mg/dL   BUN 17 6 - 23 mg/dL   Creatinine, Ser 1.44 (H) 0.50 - 1.35 mg/dL   Calcium 9.1 8.4 - 10.5 mg/dL   Total Protein 6.6 6.0 - 8.3 g/dL   Albumin 3.5 3.5 - 5.2 g/dL   AST 18 0 - 37 U/L   ALT 16 0 - 53 U/L   Alkaline Phosphatase 80 39 - 117 U/L   Total Bilirubin 0.4 0.3 - 1.2 mg/dL   GFR calc non Af Amer 57 (L) >90 mL/min   GFR calc Af Amer 66 (L) >90 mL/min   Anion gap 11 5 - 15  Lipase, blood  Result Value Ref Range   Lipase 44 11 - 59 U/L  Urinalysis, Routine w reflex microscopic  Result Value Ref Range   Color, Urine YELLOW  YELLOW   APPearance CLEAR CLEAR   Specific Gravity, Urine 1.014 1.005 - 1.030   pH 6.0 5.0 - 8.0   Glucose, UA NEGATIVE NEGATIVE mg/dL   Hgb urine dipstick NEGATIVE NEGATIVE   Bilirubin Urine NEGATIVE NEGATIVE   Ketones, ur NEGATIVE NEGATIVE mg/dL   Protein, ur NEGATIVE NEGATIVE mg/dL   Urobilinogen, UA 0.2 0.0 - 1.0 mg/dL   Nitrite NEGATIVE NEGATIVE   Leukocytes, UA TRACE (A) NEGATIVE  Urine microscopic-add on  Result Value Ref Range   Squamous Epithelial / LPF RARE RARE   WBC, UA 11-20 <3 WBC/hpf   Bacteria, UA FEW (A) RARE  I-stat troponin, ED  Result Value Ref Range   Troponin i, poc 0.00 0.00 - 0.08 ng/mL   Comment 3            Labs Reviewed - No data to display  No results found.     ASSESSMENT & PLAN:  1. Sciatica of right side   2. Acute pain of both knees     Meds ordered this encounter  Medications  . HYDROcodone-acetaminophen (NORCO/VICODIN) 5-325 MG tablet    Sig: Take 1-2 tablets by mouth every 6 (six) hours as needed for severe pain.    Dispense:  15 tablet    Refill:  0  . predniSONE (DELTASONE) 20 MG tablet    Sig: Two daily with food    Dispense:  10 tablet    Refill:  0    Reviewed expectations re: course of current medical issues. Questions answered. Outlined signs and symptoms indicating need for more acute intervention. Patient verbalized understanding. After Visit Summary given.    Procedures:      Robyn Haber, MD 05/03/17 [phone]

## 2017-05-14 DIAGNOSIS — E1140 Type 2 diabetes mellitus with diabetic neuropathy, unspecified: Secondary | ICD-10-CM | POA: Diagnosis not present

## 2017-05-14 DIAGNOSIS — M1000 Idiopathic gout, unspecified site: Secondary | ICD-10-CM | POA: Diagnosis not present

## 2017-05-14 DIAGNOSIS — M5430 Sciatica, unspecified side: Secondary | ICD-10-CM | POA: Diagnosis not present

## 2017-05-14 DIAGNOSIS — I10 Essential (primary) hypertension: Secondary | ICD-10-CM | POA: Diagnosis not present

## 2017-06-10 ENCOUNTER — Emergency Department (HOSPITAL_COMMUNITY)
Admission: EM | Admit: 2017-06-10 | Discharge: 2017-06-10 | Disposition: A | Discharge status: Home / Self Care | Payer: BLUE CROSS/BLUE SHIELD | Attending: Emergency Medicine | Admitting: Emergency Medicine

## 2017-06-10 ENCOUNTER — Encounter (HOSPITAL_COMMUNITY): Payer: Self-pay

## 2017-06-10 DIAGNOSIS — Z7984 Long term (current) use of oral hypoglycemic drugs: Secondary | ICD-10-CM | POA: Insufficient documentation

## 2017-06-10 DIAGNOSIS — M5431 Sciatica, right side: Secondary | ICD-10-CM

## 2017-06-10 DIAGNOSIS — E119 Type 2 diabetes mellitus without complications: Secondary | ICD-10-CM | POA: Diagnosis not present

## 2017-06-10 DIAGNOSIS — Z79899 Other long term (current) drug therapy: Secondary | ICD-10-CM | POA: Insufficient documentation

## 2017-06-10 DIAGNOSIS — I10 Essential (primary) hypertension: Secondary | ICD-10-CM | POA: Diagnosis not present

## 2017-06-10 DIAGNOSIS — M545 Low back pain: Secondary | ICD-10-CM | POA: Diagnosis present

## 2017-06-10 MED ORDER — CYCLOBENZAPRINE HCL 10 MG PO TABS: 10.0000 mg | ORAL_TABLET | Freq: Every day | ORAL | 0 refills | Status: DC

## 2017-06-10 MED ORDER — PREDNISONE 50 MG PO TABS: 50.0000 mg | ORAL_TABLET | Freq: Every day | ORAL | 0 refills | Status: DC

## 2017-06-10 MED ORDER — HYDROCODONE-ACETAMINOPHEN 5-325 MG PO TABS: 1.0000 | ORAL_TABLET | Freq: Four times a day (QID) | ORAL | 0 refills | Status: DC | PRN

## 2017-06-10 NOTE — ED Triage Notes (Signed)
Pt presents to ED reports sciatica flare up that is keeping him from sleeping x 2 days. PT states in the past he has been given prednisone and hydrocodone with significant improvement.

## 2017-06-10 NOTE — ED Provider Notes (Signed)
[facility] EMERGENCY DEPARTMENT Provider Note   CSN: [id number] Arrival date & time: 06/10/17  0703     History   Chief Complaint Chief Complaint  Patient presents with  . Sciatica    HPI Patrick [name] is a 52 y.o. male.  HPI Patient presents to the emergency department with lower back pain on the right that is been worse over the last 2 days.  The patient states that is preventing him from sleeping.  Patient states that he has had sciatic issues in the past.  Patient states that he was seen by urgent care about a month ago and given prednisone and pain medication.  The patient states he is also seeing a Restaurant manager, fast food.  Patient states movement and palpation make the pain worse.  The patient denies chest pain, shortness of breath, headache,blurred vision, neck pain, fever, cough, weakness, numbness, dizziness, anorexia, edema, abdominal pain, nausea, vomiting, diarrhea, rash,  dysuria, hematemesis, bloody stool, near syncope, or syncope. Past Medical History:  Diagnosis Date  . Diabetes mellitus   . Gout   . Hypertension     Patient Active Problem List   Diagnosis Date Noted  . GOUT 01/27/2010  . OBESITY 01/27/2010  . DIABETES MELLITUS, TYPE II 02/20/2008  . HYPERTENSION, BENIGN ESSENTIAL 02/20/2008    History reviewed. No pertinent surgical history.      Home Medications    Prior to Admission medications   Medication Sig Start Date End Date Taking? Authorizing Provider  azelastine (OPTIVAR) 0.05 % ophthalmic solution Place 1 drop into both eyes 2 (two) times daily. 11/21/14   Delsa Grana, PA-C  HYDROcodone-acetaminophen (NORCO/VICODIN) 5-325 MG tablet Take 1-2 tablets by mouth every 6 (six) hours as needed for severe pain. 05/03/17   Robyn Haber, MD  losartan-hydrochlorothiazide (HYZAAR) 50-12.5 MG tablet Take 1 tablet by mouth daily.    [provider]  metFORMIN (GLUCOPHAGE) 500 MG tablet Take 500 mg by mouth 2 (two) times daily with a  meal.     [provider]  Multiple Vitamin (MULTIVITAMIN WITH MINERALS) TABS Take 1 tablet by mouth daily.    [provider]  predniSONE (DELTASONE) 20 MG tablet Two daily with food 05/03/17   Robyn Haber, MD    Family History No family history on file.  Social History Social History   Tobacco Use  . Smoking status: Never Smoker  . Smokeless tobacco: Never Used  Substance Use Topics  . Alcohol use: No  . Drug use: No     Allergies   Patient has no known allergies.   Review of Systems Review of Systems All other systems negative except as documented in the HPI. All pertinent positives and negatives as reviewed in the HPI.  Physical Exam Updated Vital Signs BP (!) 139/94 (BP Location: Right Arm)   Pulse 84   Temp 98.3 F (36.8 C) (Oral)   Resp 18   Ht 6\' 4"  (1.93 m)   Wt (!) 145.2 kg (320 lb)   SpO2 100%   BMI 38.95 kg/m   Physical Exam  Constitutional: He is oriented to person, place, and time. He appears well-developed and well-nourished. No distress.  HENT:  Head: Normocephalic and atraumatic.  Eyes: Pupils are equal, round, and reactive to light.  Pulmonary/Chest: Effort normal.  Musculoskeletal:       Lumbar back: He exhibits tenderness and pain. He exhibits normal range of motion, no bony tenderness, no swelling, no edema, no deformity and no spasm.  Back:  Neurological: He is alert and oriented to person, place, and time. No sensory deficit. He exhibits normal muscle tone. Coordination normal.  Skin: Skin is warm and dry.  Psychiatric: He has a normal mood and affect.  Nursing note and vitals reviewed.    ED Treatments / Results  Labs (all labs ordered are listed, but only abnormal results are displayed) Labs Reviewed - No data to display  EKG None  Radiology No results found.  Procedures Procedures (including critical care time)  Medications Ordered in ED Medications - No data to display   Initial  Impression / Assessment and Plan / ED Course  I have reviewed the triage vital signs and the nursing notes.  Pertinent labs & imaging results that were available during my care of the patient were reviewed by me and considered in my medical decision making (see chart for details).     Patient be treated for sciatica and advised to return here as needed.  Patient is advised to follow-up with his primary doctor.  Patient is also advised that he will possibly need more definitive care rather than just medications.  Advised him to follow-up with the doctor provided if his condition does not improve.  Patient has no neurological deficits noted on examination.  Final Clinical Impressions(s) / ED Diagnoses   Final diagnoses:  None    ED Discharge Orders    None       Dalia Heading, PA-C 06/10/17 [place], MD 06/17/17 1004

## 2017-06-10 NOTE — Discharge Instructions (Signed)
Return here as needed.  Follow-up with the doctor provided if not improving.

## 2017-08-14 ENCOUNTER — Encounter (HOSPITAL_COMMUNITY): Payer: Self-pay | Admitting: Emergency Medicine

## 2017-08-14 ENCOUNTER — Emergency Department (HOSPITAL_COMMUNITY)
Admission: EM | Admit: 2017-08-14 | Discharge: 2017-08-14 | Disposition: A | Discharge status: Home / Self Care | Payer: 59 | Attending: Emergency Medicine | Admitting: Emergency Medicine

## 2017-08-14 ENCOUNTER — Other Ambulatory Visit: Payer: Self-pay

## 2017-08-14 DIAGNOSIS — Z79899 Other long term (current) drug therapy: Secondary | ICD-10-CM | POA: Insufficient documentation

## 2017-08-14 DIAGNOSIS — Z794 Long term (current) use of insulin: Secondary | ICD-10-CM | POA: Insufficient documentation

## 2017-08-14 DIAGNOSIS — M25579 Pain in unspecified ankle and joints of unspecified foot: Secondary | ICD-10-CM | POA: Insufficient documentation

## 2017-08-14 DIAGNOSIS — E119 Type 2 diabetes mellitus without complications: Secondary | ICD-10-CM | POA: Insufficient documentation

## 2017-08-14 DIAGNOSIS — M5441 Lumbago with sciatica, right side: Secondary | ICD-10-CM | POA: Insufficient documentation

## 2017-08-14 DIAGNOSIS — M25521 Pain in right elbow: Secondary | ICD-10-CM | POA: Insufficient documentation

## 2017-08-14 DIAGNOSIS — M5431 Sciatica, right side: Secondary | ICD-10-CM

## 2017-08-14 DIAGNOSIS — M25571 Pain in right ankle and joints of right foot: Secondary | ICD-10-CM

## 2017-08-14 DIAGNOSIS — I10 Essential (primary) hypertension: Secondary | ICD-10-CM | POA: Insufficient documentation

## 2017-08-14 MED ORDER — CYCLOBENZAPRINE HCL 10 MG PO TABS: 10.0000 mg | ORAL_TABLET | Freq: Three times a day (TID) | ORAL | 0 refills | Status: DC

## 2017-08-14 MED ORDER — PREDNISONE 10 MG PO TABS: ORAL_TABLET | ORAL | 0 refills | Status: DC

## 2017-08-14 NOTE — ED Notes (Signed)
ED Provider at bedside. 

## 2017-08-14 NOTE — ED Notes (Signed)
Discharge instructions/prescriptions reviewed with patient, pt verbalized understanding and denies further requests.

## 2017-08-14 NOTE — ED Provider Notes (Signed)
[facility] EMERGENCY DEPARTMENT Provider Note   CSN: [id number] Arrival date & time: 08/14/17  0346     History   Chief Complaint Chief Complaint  Patient presents with  . Gout  . Sciatica    HPI Patrick [name] is a 52 y.o. male.  Patient here for evaluation of right lower back pain radiating into the right posterior leg, similar to previous flares of sciatica. 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 No fever. No significant swelling of the painful joints. He denies urinary/bowel dysfunction, saddle anesthesia, weakness of the right LE or numbness.   The history is provided by the patient. No language interpreter was used.    Past Medical History:  Diagnosis Date  . Diabetes mellitus   . Gout   . Hypertension     Patient Active Problem List   Diagnosis Date Noted  . GOUT 01/27/2010  . OBESITY 01/27/2010  . DIABETES MELLITUS, TYPE II 02/20/2008  . HYPERTENSION, BENIGN ESSENTIAL 02/20/2008    History reviewed. No pertinent surgical history.      Home Medications    Prior to Admission medications   Medication Sig Start Date End Date Taking? Authorizing Provider  glimepiride (AMARYL) 4 MG tablet Take 4 mg by mouth daily with breakfast.   Yes [provider]  HYDROcodone-acetaminophen (NORCO/VICODIN) 5-325 MG tablet Take 1 tablet by mouth every 6 (six) hours as needed for moderate pain. 06/10/17  Yes Lawyer, Harrell Gave, PA-C  Insulin Degludec (TRESIBA FLEXTOUCH) 200 UNIT/ML SOPN Inject 30 Units into the skin every morning.   Yes [provider]  losartan-hydrochlorothiazide (HYZAAR) 50-12.5 MG tablet Take 1 tablet by mouth daily.   Yes [provider]  metFORMIN (GLUCOPHAGE) 500 MG tablet Take 500 mg by mouth 2 (two) times daily with a meal.    Yes [provider]  Multiple Vitamin (MULTIVITAMIN WITH MINERALS) TABS Take 1 tablet by mouth daily.   Yes [provider]    azelastine (OPTIVAR) 0.05 % ophthalmic solution Place 1 drop into both eyes 2 (two) times daily. Patient not taking: Reported on 08/14/2017 11/21/14   Delsa Grana, PA-C  cyclobenzaprine (FLEXERIL) 10 MG tablet Take 1 tablet (10 mg total) by mouth at bedtime. Patient not taking: Reported on 08/14/2017 06/10/17   Dalia Heading, PA-C  predniSONE (DELTASONE) 50 MG tablet Take 1 tablet (50 mg total) by mouth daily with breakfast. Patient not taking: Reported on 08/14/2017 06/10/17   Dalia Heading, PA-C    Family History No family history on file.  Social History Social History   Tobacco Use  . Smoking status: Never Smoker  . Smokeless tobacco: Never Used  Substance Use Topics  . Alcohol use: No  . Drug use: No     Allergies   Patient has no known allergies.   Review of Systems Review of Systems  Constitutional: Negative for chills and fever.  HENT: Negative.   Respiratory: Negative.  Negative for shortness of breath.   Cardiovascular: Negative.   Gastrointestinal: Negative.  Negative for abdominal pain and vomiting.  Genitourinary: Negative for enuresis.  Musculoskeletal: Positive for arthralgias and back pain.  Skin: Negative.   Neurological: Negative.  Negative for weakness and numbness.     Physical Exam Updated Vital Signs BP 136/88   Pulse 100   Temp 100 F (37.8 C)   Resp 18   SpO2 98%   Physical Exam  Constitutional: He is oriented to person, place, and time. He appears  well-developed and well-nourished.  Neck: Normal range of motion.  Cardiovascular: Intact distal pulses.  Pulmonary/Chest: Effort normal.  Abdominal: There is no tenderness.  Musculoskeletal: Normal range of motion. He exhibits edema (1-2+ edema bilateral lower extremities.).  No significant swelling, redness or warmth of right elbow or ankle joints. FROM without restriction.   Neurological: He is alert and oriented to person, place, and time. He displays normal reflexes.  Skin:  Skin is warm and dry.  Psychiatric: He has a normal mood and affect.     ED Treatments / Results  Labs (all labs ordered are listed, but only abnormal results are displayed) Labs Reviewed - No data to display  EKG None  Radiology No results found.  Procedures Procedures (including critical care time)  Medications Ordered in ED Medications - No data to display   Initial Impression / Assessment and Plan / ED Course  I have reviewed the triage vital signs and the nursing notes.  Pertinent labs & imaging results that were available during my care of the patient were reviewed by me and considered in my medical decision making (see chart for details).     Patient here with low back pain c/w previous sciatica. No neurologic deficits or red flags. Do not suspect significant arthritic flare of elbow and ankle. Will Rx taper prednisone for 6 days that should benefit both complaints. Will add Flexeril. The patient states he normally get hydrocodone for these symptoms. I discussed that I did not feel narcotic pain relief were indicated and encouraged him to see primary care.   Final Clinical Impressions(s) / ED Diagnoses   Final diagnoses:  None   1. Right sciatica 2. Arthralgia  ED Discharge Orders    None       Charlann Lange, PA-C 08/14/17 8921    Ezequiel Essex, MD 08/14/17 [phone]

## 2017-08-14 NOTE — ED Triage Notes (Signed)
Pt comes from home with complaint of sciatic pain down his right leg. Also endorses pain from gout in his right elbow and right ankle. States he has history of same and just would like some prescriptions for these issues.

## 2017-11-03 ENCOUNTER — Emergency Department (HOSPITAL_COMMUNITY): Payer: 59

## 2017-11-03 ENCOUNTER — Other Ambulatory Visit: Payer: Self-pay

## 2017-11-03 ENCOUNTER — Emergency Department (HOSPITAL_COMMUNITY)
Admission: EM | Admit: 2017-11-03 | Discharge: 2017-11-04 | Disposition: A | Discharge status: Home / Self Care | Payer: 59 | Attending: Emergency Medicine | Admitting: Emergency Medicine

## 2017-11-03 DIAGNOSIS — R0989 Other specified symptoms and signs involving the circulatory and respiratory systems: Secondary | ICD-10-CM | POA: Diagnosis not present

## 2017-11-03 DIAGNOSIS — M546 Pain in thoracic spine: Secondary | ICD-10-CM | POA: Diagnosis not present

## 2017-11-03 DIAGNOSIS — Z794 Long term (current) use of insulin: Secondary | ICD-10-CM | POA: Insufficient documentation

## 2017-11-03 DIAGNOSIS — E119 Type 2 diabetes mellitus without complications: Secondary | ICD-10-CM | POA: Diagnosis not present

## 2017-11-03 DIAGNOSIS — M7918 Myalgia, other site: Secondary | ICD-10-CM

## 2017-11-03 DIAGNOSIS — I10 Essential (primary) hypertension: Secondary | ICD-10-CM | POA: Diagnosis not present

## 2017-11-03 DIAGNOSIS — M791 Myalgia, unspecified site: Secondary | ICD-10-CM | POA: Diagnosis not present

## 2017-11-03 DIAGNOSIS — R7989 Other specified abnormal findings of blood chemistry: Secondary | ICD-10-CM | POA: Diagnosis not present

## 2017-11-03 DIAGNOSIS — R000 Tachycardia, unspecified: Secondary | ICD-10-CM | POA: Diagnosis not present

## 2017-11-03 DIAGNOSIS — M549 Dorsalgia, unspecified: Secondary | ICD-10-CM | POA: Diagnosis present

## 2017-11-03 DIAGNOSIS — Z79899 Other long term (current) drug therapy: Secondary | ICD-10-CM | POA: Insufficient documentation

## 2017-11-03 LAB — BASIC METABOLIC PANEL
Anion gap: 13 (ref 5–15)
BUN: 23 mg/dL — AB (ref 6–20)
CO2: 21 mmol/L — AB (ref 22–32)
CREATININE: 1.48 mg/dL — AB (ref 0.61–1.24)
Calcium: 9.2 mg/dL (ref 8.9–10.3)
Chloride: 102 mmol/L (ref 98–111)
GFR calc Af Amer: >60 mL/min (ref >60)
GFR calc non Af Amer: 53 mL/min — ABNORMAL LOW (ref >60)
Glucose, Bld: 248 mg/dL — ABNORMAL HIGH (ref 70–99)
Potassium: 4.3 mmol/L (ref 3.5–5.1)
Sodium: 136 mmol/L (ref 135–145)

## 2017-11-03 LAB — CBC
HCT: 38.3 % — ABNORMAL LOW (ref 39.0–52.0)
Hemoglobin: 11.8 g/dL — ABNORMAL LOW (ref 13.0–17.0)
MCH: 26.5 pg (ref 26.0–34.0)
MCHC: 30.8 g/dL (ref 30.0–36.0)
MCV: 85.9 fL (ref 78.0–100.0)
PLATELETS: 397 10*3/uL (ref 150–400)
RBC: 4.46 MIL/uL (ref 4.22–5.81)
RDW: 13.6 % (ref 11.5–15.5)
WBC: 8 10*3/uL (ref 4.0–10.5)

## 2017-11-03 LAB — I-STAT TROPONIN, ED: Troponin i, poc: 0.01 ng/mL (ref 0.00–0.08)

## 2017-11-03 NOTE — ED Triage Notes (Signed)
Patient c/o back and right flank pain that began about m4 days ago.  Also c/o neck pain.

## 2017-11-04 ENCOUNTER — Emergency Department (HOSPITAL_COMMUNITY): Payer: 59

## 2017-11-04 DIAGNOSIS — R7989 Other specified abnormal findings of blood chemistry: Secondary | ICD-10-CM | POA: Diagnosis not present

## 2017-11-04 LAB — URINALYSIS, ROUTINE W REFLEX MICROSCOPIC
BACTERIA UA: NONE SEEN
Bilirubin Urine: NEGATIVE
Glucose, UA: 150 mg/dL — AB
Hgb urine dipstick: NEGATIVE
KETONES UR: NEGATIVE mg/dL
Leukocytes, UA: NEGATIVE
Nitrite: NEGATIVE
PH: 5 (ref 5.0–8.0)
Protein, ur: NEGATIVE mg/dL
Specific Gravity, Urine: 1.019 (ref 1.005–1.030)

## 2017-11-04 LAB — D-DIMER, QUANTITATIVE (NOT AT ARMC): D DIMER QUANT: 1.33 ug{FEU}/mL — AB (ref 0.00–0.50)

## 2017-11-04 MED ORDER — IBUPROFEN 600 MG PO TABS: 600.0000 mg | ORAL_TABLET | Freq: Four times a day (QID) | ORAL | 0 refills | Status: DC | PRN

## 2017-11-04 MED ORDER — IOPAMIDOL (ISOVUE-370) INJECTION 76%
INTRAVENOUS | Status: AC
  Filled 2017-11-04: qty 100

## 2017-11-04 MED ORDER — METHOCARBAMOL 750 MG PO TABS: 750.0000 mg | ORAL_TABLET | Freq: Three times a day (TID) | ORAL | 0 refills | Status: DC

## 2017-11-04 MED ORDER — HYDROCODONE-ACETAMINOPHEN 5-325 MG PO TABS: 1.0000 | ORAL_TABLET | Freq: Once | ORAL | Status: DC

## 2017-11-04 MED ORDER — MORPHINE SULFATE (PF) 4 MG/ML IV SOLN
4.0000 mg | Freq: Once | INTRAVENOUS | Status: AC
  Administered 2017-11-04: 4 mg via INTRAVENOUS
  Filled 2017-11-04: qty 1

## 2017-11-04 MED ORDER — IOPAMIDOL (ISOVUE-370) INJECTION 76%
100.0000 mL | Freq: Once | INTRAVENOUS | Status: AC | PRN
  Administered 2017-11-04: 100 mL via INTRAVENOUS

## 2017-11-04 NOTE — ED Notes (Signed)
EDP aware of patient's current B/P

## 2017-11-04 NOTE — ED Notes (Signed)
States he is taking his blood pressure medication every day however only for the last week.

## 2017-11-04 NOTE — ED Provider Notes (Signed)
[facility] EMERGENCY DEPARTMENT Provider Note   CSN: [id number] Arrival date & time: 11/03/17  2248     History   Chief Complaint Chief Complaint  Patient presents with  . Back Pain    HPI Patrick [name] is a 52 y.o. male.  Patient here with right sided back pain x 4 days. He describes the pain as dull/sharp intermittently. Tylenol, ibuprofen and Indocin without relief. The pain is worse with movement and better with rest. No trauma, flank pain or urinary symptoms. No history of kidney stones. He has a history of right sciatica but reports this is completely different location than usual pain. Pain today is higher in the back than usual. No nausea, diarrhea, fever. No chest pain, SOB, cough.   The history is provided by the patient. No language interpreter was used.  Back Pain   Pertinent negatives include no chest pain and no fever.    Past Medical History:  Diagnosis Date  . Diabetes mellitus   . Gout   . Hypertension     Patient Active Problem List   Diagnosis Date Noted  . GOUT 01/27/2010  . OBESITY 01/27/2010  . DIABETES MELLITUS, TYPE II 02/20/2008  . HYPERTENSION, BENIGN ESSENTIAL 02/20/2008    No past surgical history on file.      Home Medications    Prior to Admission medications   Medication Sig Start Date End Date Taking? Authorizing Provider  indomethacin (INDOCIN) 50 MG capsule Take 50 mg by mouth 3 (three) times daily as needed for chest pain. 08/29/17  Yes [provider]  Insulin Degludec (TRESIBA FLEXTOUCH) 200 UNIT/ML SOPN Inject 30 Units into the skin every morning.   Yes [provider]  losartan-hydrochlorothiazide (HYZAAR) 100-25 MG tablet Take 1 tablet by mouth daily. 10/29/17  Yes [provider]  metFORMIN (GLUCOPHAGE) 500 MG tablet Take 500 mg by mouth 2 (two) times daily with a meal.    Yes [provider]  azelastine (OPTIVAR) 0.05 % ophthalmic solution Place 1 drop into both eyes 2  (two) times daily. Patient not taking: Reported on 08/14/2017 11/21/14   Delsa Grana, PA-C  cyclobenzaprine (FLEXERIL) 10 MG tablet Take 1 tablet (10 mg total) by mouth 3 (three) times daily. Patient not taking: Reported on 11/04/2017 08/14/17   Charlann Lange, PA-C  HYDROcodone-acetaminophen (NORCO/VICODIN) 5-325 MG tablet Take 1 tablet by mouth every 6 (six) hours as needed for moderate pain. Patient not taking: Reported on 11/04/2017 06/10/17   Dalia Heading, PA-C  ibuprofen (ADVIL,MOTRIN) 600 MG tablet Take 1 tablet (600 mg total) by mouth every 6 (six) hours as needed. 11/04/17   Charlann Lange, PA-C  methocarbamol (ROBAXIN) 750 MG tablet Take 1 tablet (750 mg total) by mouth 3 (three) times daily. 11/04/17   Charlann Lange, PA-C    Family History No family history on file.  Social History Social History   Tobacco Use  . Smoking status: Never Smoker  . Smokeless tobacco: Never Used  Substance Use Topics  . Alcohol use: No  . Drug use: No     Allergies   Patient has no known allergies.   Review of Systems Review of Systems  Constitutional: Negative for chills and fever.  HENT: Negative.   Respiratory: Negative.  Negative for cough and shortness of breath.   Cardiovascular: Negative.  Negative for chest pain.  Gastrointestinal: Negative.   Genitourinary: Negative.  Negative for flank pain.  Musculoskeletal: Positive for back pain.  Skin: Negative.   Neurological:  Negative.      Physical Exam Updated Vital Signs BP (!) 162/127 (BP Location: Left Arm)   Pulse (!) 117   Temp 97.9 F (36.6 C) (Oral)   Resp 18   Wt (!) 145.2 kg   SpO2 90%   BMI 38.95 kg/m   Physical Exam  Constitutional: He appears well-developed and well-nourished.  HENT:  Head: Normocephalic.  Neck: Normal range of motion. Neck supple.  Cardiovascular: Regular rhythm. Tachycardia present.  Pulmonary/Chest: Effort normal and breath sounds normal.  Abdominal: Soft. Bowel sounds are normal.  There is no tenderness. There is no rebound and no guarding.  Musculoskeletal: Normal range of motion.       Back:  Neurological: He is alert. No cranial nerve deficit.  Skin: Skin is warm and dry. No rash noted.  Psychiatric: He has a normal mood and affect.  Nursing note and vitals reviewed.    ED Treatments / Results  Labs (all labs ordered are listed, but only abnormal results are displayed) Labs Reviewed  BASIC METABOLIC PANEL - Abnormal; Notable for the following components:      Result Value   CO2 21 (*)    Glucose, Bld 248 (*)    BUN 23 (*)    Creatinine, Ser 1.48 (*)    GFR calc non Af Amer 53 (*)    All other components within normal limits  CBC - Abnormal; Notable for the following components:   Hemoglobin 11.8 (*)    HCT 38.3 (*)    All other components within normal limits  URINALYSIS, ROUTINE W REFLEX MICROSCOPIC - Abnormal; Notable for the following components:   Glucose, UA 150 (*)    All other components within normal limits  D-DIMER, QUANTITATIVE (NOT AT [facility]) - Abnormal; Notable for the following components:   D-Dimer, Quant 1.33 (*)    All other components within normal limits  I-STAT TROPONIN, ED    EKG EKG Interpretation  Date/Time:  Monday November 04 2017 01:01:42 EDT Ventricular Rate:  86 PR Interval:  152 QRS Duration: 100 QT Interval:  347 QTC Calculation: 415 R Axis:   80 Text Interpretation:  Sinus rhythm No significant change was found Confirmed by Ezequiel Essex [phone]) on 11/04/2017 1:06:48 AM   Radiology Dg Chest 2 View  Result Date: 11/03/2017 CLINICAL DATA:  Pain below the right scapular for 4 days. EXAM: CHEST - 2 VIEW COMPARISON:  10/11/2016 FINDINGS: Normal heart size and pulmonary vascularity. No focal airspace disease or consolidation in the lungs. No blunting of costophrenic angles. No pneumothorax. Mediastinal contours appear intact. Degenerative changes in the thoracic spine. Degenerative changes in both shoulders,  greater on the left. Tortuous aorta. IMPRESSION: No active cardiopulmonary disease. Electronically Signed   By: Lucienne Capers M.D.   On: 11/03/2017 23:11   Ct Angio Chest Pe W/cm &/or Wo Cm  Result Date: 11/04/2017 CLINICAL DATA:  Mid back pain for 4 days. Positive D-dimer. EXAM: CT ANGIOGRAPHY CHEST WITH CONTRAST TECHNIQUE: Multidetector CT imaging of the chest was performed using the standard protocol during bolus administration of intravenous contrast. Multiplanar CT image reconstructions and MIPs were obtained to evaluate the vascular anatomy. CONTRAST:  147mL ISOVUE-370 IOPAMIDOL (ISOVUE-370) INJECTION 76% COMPARISON:  Chest radiograph 11/03/2017 FINDINGS: Cardiovascular: Contrast bolus is somewhat limited. There is moderately good opacification of the central pulmonary arteries. No large filling defects identified. Likely no significant pulmonary embolus. Normal caliber thoracic aorta. No aortic dissection. Great vessel origins are patent. Normal heart size. No pericardial effusion. Mediastinum/Nodes:  No enlarged mediastinal, hilar, or axillary lymph nodes. Thyroid gland, trachea, and esophagus demonstrate no significant findings. Lungs/Pleura: Motion artifact limits evaluation. No focal consolidation or atelectasis. No pleural effusions. No pneumothorax. Airways are patent. Upper Abdomen: No acute abnormality. Musculoskeletal: No chest wall abnormality. No acute or significant osseous findings. Review of the MIP images confirms the above findings. IMPRESSION: No evidence of significant pulmonary embolus. No evidence of active pulmonary disease. Electronically Signed   By: Lucienne Capers M.D.   On: 11/04/2017 04:31    Procedures Procedures (including critical care time)  Medications Ordered in ED Medications  morphine 4 MG/ML injection 4 mg (4 mg Intravenous Given 11/04/17 0333)  iopamidol (ISOVUE-370) 76 % injection 100 mL (100 mLs Intravenous Contrast Given 11/04/17 0409)     Initial  Impression / Assessment and Plan / ED Course  I have reviewed the triage vital signs and the nursing notes.  Pertinent labs & imaging results that were available during my care of the patient were reviewed by me and considered in my medical decision making (see chart for details).     Patient to ED with sharp pain in upper back without known injury. No SOB, cough or fever. He reports worse pain with movement, including the work of breathing. Better when still or with rest.   He is found to be tachycardic on arrival. Review of chart shows he is usually tachycardic around 100-110 on previous charts. He is 140's on arrival. Fluids ordered along with pain medications. HR improves with pain relief. No EKG changes.   Pain felt to be musculoskeletal however d-dimer elevated. CT angio ordered and is negative for PE. Also reports negative for dissection.  Patient is also hypertensive. He reports he takes his medications in the mornings and is encouraged to be compliant with meds.   He can be discharged home with medication for muscular pain. Dr. Wyvonnia Dusky has participated in the care and assessment of this patient.   Final Clinical Impressions(s) / ED Diagnoses   Final diagnoses:  Musculoskeletal pain    ED Discharge Orders         Ordered    ibuprofen (ADVIL,MOTRIN) 600 MG tablet  Every 6 hours PRN     11/04/17 0516    methocarbamol (ROBAXIN) 750 MG tablet  3 times daily     11/04/17 0516           Charlann Lange, PA-C 11/05/17 3235    Ezequiel Essex, MD 11/06/17 [phone]

## 2017-12-02 DIAGNOSIS — E1140 Type 2 diabetes mellitus with diabetic neuropathy, unspecified: Secondary | ICD-10-CM | POA: Diagnosis not present

## 2017-12-02 DIAGNOSIS — M5489 Other dorsalgia: Secondary | ICD-10-CM | POA: Diagnosis not present

## 2017-12-02 DIAGNOSIS — M1000 Idiopathic gout, unspecified site: Secondary | ICD-10-CM | POA: Diagnosis not present

## 2017-12-02 DIAGNOSIS — I10 Essential (primary) hypertension: Secondary | ICD-10-CM | POA: Diagnosis not present

## 2017-12-02 DIAGNOSIS — Z125 Encounter for screening for malignant neoplasm of prostate: Secondary | ICD-10-CM | POA: Diagnosis not present

## 2018-01-06 DIAGNOSIS — M25551 Pain in right hip: Secondary | ICD-10-CM | POA: Diagnosis not present

## 2018-01-06 DIAGNOSIS — E1140 Type 2 diabetes mellitus with diabetic neuropathy, unspecified: Secondary | ICD-10-CM | POA: Diagnosis not present

## 2018-01-06 DIAGNOSIS — I10 Essential (primary) hypertension: Secondary | ICD-10-CM | POA: Diagnosis not present

## 2018-01-06 DIAGNOSIS — M1611 Unilateral primary osteoarthritis, right hip: Secondary | ICD-10-CM | POA: Diagnosis not present

## 2018-01-20 DIAGNOSIS — M2560 Stiffness of unspecified joint, not elsewhere classified: Secondary | ICD-10-CM | POA: Diagnosis not present

## 2018-01-20 DIAGNOSIS — R293 Abnormal posture: Secondary | ICD-10-CM | POA: Diagnosis not present

## 2018-01-20 DIAGNOSIS — M5431 Sciatica, right side: Secondary | ICD-10-CM | POA: Diagnosis not present

## 2018-02-24 DIAGNOSIS — M79643 Pain in unspecified hand: Secondary | ICD-10-CM | POA: Diagnosis not present

## 2018-02-24 DIAGNOSIS — E1140 Type 2 diabetes mellitus with diabetic neuropathy, unspecified: Secondary | ICD-10-CM | POA: Diagnosis not present

## 2018-02-24 DIAGNOSIS — I10 Essential (primary) hypertension: Secondary | ICD-10-CM | POA: Diagnosis not present

## 2018-03-03 ENCOUNTER — Other Ambulatory Visit: Payer: Self-pay | Admitting: Orthopedic Surgery

## 2018-03-03 DIAGNOSIS — M25561 Pain in right knee: Secondary | ICD-10-CM | POA: Diagnosis not present

## 2018-03-03 DIAGNOSIS — M25551 Pain in right hip: Secondary | ICD-10-CM | POA: Diagnosis not present

## 2018-03-09 ENCOUNTER — Other Ambulatory Visit: Payer: 59

## 2018-03-18 ENCOUNTER — Ambulatory Visit (INDEPENDENT_AMBULATORY_CARE_PROVIDER_SITE_OTHER): Payer: 59

## 2018-03-18 ENCOUNTER — Ambulatory Visit (HOSPITAL_COMMUNITY)
Admission: EM | Admit: 2018-03-18 | Discharge: 2018-03-18 | Disposition: A | Discharge status: Home / Self Care | Payer: 59 | Attending: Family Medicine | Admitting: Family Medicine

## 2018-03-18 ENCOUNTER — Encounter (HOSPITAL_COMMUNITY): Payer: Self-pay | Admitting: Emergency Medicine

## 2018-03-18 DIAGNOSIS — K5901 Slow transit constipation: Secondary | ICD-10-CM | POA: Diagnosis not present

## 2018-03-18 DIAGNOSIS — R109 Unspecified abdominal pain: Secondary | ICD-10-CM | POA: Diagnosis not present

## 2018-03-18 MED ORDER — POLYETHYLENE GLYCOL 3350 17 G PO PACK: 17.0000 g | PACK | Freq: Every day | ORAL | 0 refills | Status: DC

## 2018-03-18 NOTE — Discharge Instructions (Addendum)
Your problem is indeed constipation.  We need to get your bowels moving a little better so I am asking you to take the MiraLAX every 4 hours while awake until you have a bowel movement.  He simply mix the powder in an 8 ounce glass of water for each dose.

## 2018-03-18 NOTE — ED Provider Notes (Signed)
[place]    CSN: [id number] Arrival date & time: 03/18/18  1355     History   Chief Complaint Chief Complaint  Patient presents with  . Flank Pain    HPI Patrick [name] is a 53 y.o. male.   This is a 53 year old established [facility] urgent care patient who presents today complaining of flank pain.   Pt sts right sided flank and rib area pain x several days; pt sts thinks is swollen and tender to touch.  The discomfort started several days ago.  It is described as a burning kind of a feeling that is an inch deep below the skin.  It does not seem to be affecting his ribs, breathing, or stomach and that he has no nausea.  He has had no shortness of breath.  Patient has had quite a bit of constipation lately.       Past Medical History:  Diagnosis Date  . Diabetes mellitus   . Gout   . Hypertension     Patient Active Problem List   Diagnosis Date Noted  . GOUT 01/27/2010  . OBESITY 01/27/2010  . DIABETES MELLITUS, TYPE II 02/20/2008  . HYPERTENSION, BENIGN ESSENTIAL 02/20/2008    History reviewed. No pertinent surgical history.     Home Medications    Prior to Admission medications   Medication Sig Start Date End Date Taking? Authorizing Provider  ibuprofen (ADVIL,MOTRIN) 600 MG tablet Take 1 tablet (600 mg total) by mouth every 6 (six) hours as needed. 11/04/17   Charlann Lange, PA-C  indomethacin (INDOCIN) 50 MG capsule Take 50 mg by mouth 3 (three) times daily as needed for chest pain. 08/29/17   [provider]  Insulin Degludec (TRESIBA FLEXTOUCH) 200 UNIT/ML SOPN Inject 30 Units into the skin every morning.    [provider]  losartan-hydrochlorothiazide (HYZAAR) 100-25 MG tablet Take 1 tablet by mouth daily. 10/29/17   [provider]  metFORMIN (GLUCOPHAGE) 500 MG tablet Take 500 mg by mouth 2 (two) times daily with a meal.     [provider]  methocarbamol (ROBAXIN) 750 MG tablet Take 1 tablet (750 mg  total) by mouth 3 (three) times daily. 11/04/17   Charlann Lange, PA-C  polyethylene glycol (MIRALAX) packet Take 17 g by mouth daily. 03/18/18   Robyn Haber, MD    Family History History reviewed. No pertinent family history.  Social History Social History   Tobacco Use  . Smoking status: Never Smoker  . Smokeless tobacco: Never Used  Substance Use Topics  . Alcohol use: No  . Drug use: No     Allergies   Patient has no known allergies.   Review of Systems Review of Systems   Physical Exam Triage Vital Signs ED Triage Vitals [03/18/18 1436]  Enc Vitals Group     BP (!) 150/109     Pulse Rate (!) 128     Resp 18     Temp 98.1 F (36.7 C)     Temp Source Temporal     SpO2 100 %     Weight      Height      Head Circumference      Peak Flow      Pain Score 10     Pain Loc      Pain Edu?      Excl. in [place]?    No data found.  Updated Vital Signs BP (!) 150/109 (BP Location: Right Arm)   Pulse Marland Kitchen)  128   Temp 98.1 F (36.7 C) (Temporal)   Resp 18   SpO2 100%    Physical Exam Vitals signs and nursing note reviewed.  Constitutional:      General: He is not in acute distress.    Appearance: Normal appearance. He is obese. He is not ill-appearing, toxic-appearing or diaphoretic.  HENT:     Head: Normocephalic and atraumatic.     Right Ear: External ear normal.     Left Ear: External ear normal.     Nose: Nose normal.     Mouth/Throat:     Mouth: Mucous membranes are moist.     Pharynx: Oropharynx is clear.  Eyes:     Conjunctiva/sclera: Conjunctivae normal.  Neck:     Musculoskeletal: Normal range of motion and neck supple.  Cardiovascular:     Rate and Rhythm: Regular rhythm. Tachycardia present.     Heart sounds: Normal heart sounds.  Pulmonary:     Effort: Pulmonary effort is normal.     Breath sounds: Normal breath sounds.  Abdominal:     General: Bowel sounds are normal. There is no distension.     Palpations: There is no mass.      Tenderness: There is no abdominal tenderness.     Comments: The right upper quadrant appears to be somewhat more full than the left upper quadrant  Musculoskeletal: Normal range of motion.  Skin:    General: Skin is warm and dry.  Neurological:     General: No focal deficit present.     Mental Status: He is alert.     Comments: Patient skin is somewhat hypersensitive in the right upper quadrant of his abdomen area  Psychiatric:        Mood and Affect: Mood normal.        Thought Content: Thought content normal.        Judgment: Judgment normal.      UC Treatments / Results  Labs (all labs ordered are listed, but only abnormal results are displayed) Labs Reviewed - No data to display  EKG None  Radiology No results found.  Procedures Procedures (including critical care time)  Medications Ordered in UC Medications - No data to display  Initial Impression / Assessment and Plan / UC Course  I have reviewed the triage vital signs and the nursing notes.  Pertinent labs & imaging results that were available during my care of the patient were reviewed by me and considered in my medical decision making (see chart for details).    Final Clinical Impressions(s) / UC Diagnoses   Final diagnoses:  Slow transit constipation     Discharge Instructions     Your problem is indeed constipation.  We need to get your bowels moving a little better so I am asking you to take the MiraLAX every 4 hours while awake until you have a bowel movement.  He simply mix the powder in an 8 ounce glass of water for each dose.    ED Prescriptions    Medication Sig Dispense Auth. Provider   polyethylene glycol (MIRALAX) packet Take 17 g by mouth daily. 96 each Robyn Haber, MD     Controlled Substance Prescriptions [place] Controlled Substance Registry consulted? Not Applicable   Robyn Haber, MD 03/18/18 [phone]

## 2018-03-18 NOTE — ED Triage Notes (Signed)
Pt sts right sided flank and rib area pain x several days; pt sts thinks is swollen and tender to touch

## 2018-03-31 ENCOUNTER — Ambulatory Visit
Admission: RE | Admit: 2018-03-31 | Discharge: 2018-03-31 | Disposition: A | Discharge status: Home / Self Care | Payer: 59 | Source: Ambulatory Visit | Attending: Orthopedic Surgery | Admitting: Orthopedic Surgery

## 2018-03-31 DIAGNOSIS — M25561 Pain in right knee: Secondary | ICD-10-CM

## 2019-03-04 DIAGNOSIS — L853 Xerosis cutis: Secondary | ICD-10-CM | POA: Diagnosis not present

## 2019-03-04 DIAGNOSIS — L219 Seborrheic dermatitis, unspecified: Secondary | ICD-10-CM | POA: Diagnosis not present

## 2019-03-23 DIAGNOSIS — E1140 Type 2 diabetes mellitus with diabetic neuropathy, unspecified: Secondary | ICD-10-CM | POA: Diagnosis not present

## 2019-03-23 DIAGNOSIS — Z0000 Encounter for general adult medical examination without abnormal findings: Secondary | ICD-10-CM | POA: Diagnosis not present

## 2019-03-23 DIAGNOSIS — Z79899 Other long term (current) drug therapy: Secondary | ICD-10-CM | POA: Diagnosis not present

## 2019-03-23 DIAGNOSIS — E559 Vitamin D deficiency, unspecified: Secondary | ICD-10-CM | POA: Diagnosis not present

## 2019-03-23 DIAGNOSIS — E7849 Other hyperlipidemia: Secondary | ICD-10-CM | POA: Diagnosis not present

## 2019-03-23 DIAGNOSIS — Z125 Encounter for screening for malignant neoplasm of prostate: Secondary | ICD-10-CM | POA: Diagnosis not present

## 2019-03-23 DIAGNOSIS — M1000 Idiopathic gout, unspecified site: Secondary | ICD-10-CM | POA: Diagnosis not present

## 2019-03-23 DIAGNOSIS — I10 Essential (primary) hypertension: Secondary | ICD-10-CM | POA: Diagnosis not present

## 2019-04-20 DIAGNOSIS — Z8616 Personal history of COVID-19: Secondary | ICD-10-CM

## 2019-04-20 HISTORY — DX: Personal history of COVID-19: Z86.16

## 2019-04-27 DIAGNOSIS — E1165 Type 2 diabetes mellitus with hyperglycemia: Secondary | ICD-10-CM | POA: Diagnosis not present

## 2019-04-27 DIAGNOSIS — Z20828 Contact with and (suspected) exposure to other viral communicable diseases: Secondary | ICD-10-CM | POA: Diagnosis not present

## 2019-04-27 DIAGNOSIS — R531 Weakness: Secondary | ICD-10-CM | POA: Diagnosis not present

## 2019-04-27 DIAGNOSIS — R05 Cough: Secondary | ICD-10-CM | POA: Diagnosis not present

## 2019-05-02 DIAGNOSIS — R778 Other specified abnormalities of plasma proteins: Secondary | ICD-10-CM | POA: Diagnosis not present

## 2019-05-02 DIAGNOSIS — E860 Dehydration: Secondary | ICD-10-CM | POA: Diagnosis not present

## 2019-05-02 DIAGNOSIS — R0602 Shortness of breath: Secondary | ICD-10-CM | POA: Diagnosis not present

## 2019-05-02 DIAGNOSIS — U071 COVID-19: Secondary | ICD-10-CM | POA: Diagnosis not present

## 2019-05-03 ENCOUNTER — Inpatient Hospital Stay (HOSPITAL_COMMUNITY): Payer: BC Managed Care – PPO

## 2019-05-03 ENCOUNTER — Inpatient Hospital Stay (HOSPITAL_COMMUNITY)
Admission: AD | Admit: 2019-05-03 | Discharge: 2019-05-07 | DRG: 177 | Disposition: A | Discharge status: Home / Self Care | Payer: BC Managed Care – PPO | Source: Other Acute Inpatient Hospital | Attending: Internal Medicine | Admitting: Internal Medicine

## 2019-05-03 ENCOUNTER — Encounter (HOSPITAL_COMMUNITY): Payer: Self-pay | Admitting: Family Medicine

## 2019-05-03 DIAGNOSIS — T380X5A Adverse effect of glucocorticoids and synthetic analogues, initial encounter: Secondary | ICD-10-CM | POA: Diagnosis not present

## 2019-05-03 DIAGNOSIS — Z79899 Other long term (current) drug therapy: Secondary | ICD-10-CM

## 2019-05-03 DIAGNOSIS — Z794 Long term (current) use of insulin: Secondary | ICD-10-CM | POA: Diagnosis not present

## 2019-05-03 DIAGNOSIS — R7989 Other specified abnormal findings of blood chemistry: Secondary | ICD-10-CM | POA: Diagnosis not present

## 2019-05-03 DIAGNOSIS — J069 Acute upper respiratory infection, unspecified: Secondary | ICD-10-CM

## 2019-05-03 DIAGNOSIS — N289 Disorder of kidney and ureter, unspecified: Secondary | ICD-10-CM | POA: Diagnosis not present

## 2019-05-03 DIAGNOSIS — M109 Gout, unspecified: Secondary | ICD-10-CM | POA: Diagnosis present

## 2019-05-03 DIAGNOSIS — I10 Essential (primary) hypertension: Secondary | ICD-10-CM | POA: Diagnosis present

## 2019-05-03 DIAGNOSIS — R000 Tachycardia, unspecified: Secondary | ICD-10-CM

## 2019-05-03 DIAGNOSIS — J1282 Pneumonia due to coronavirus disease 2019: Secondary | ICD-10-CM | POA: Diagnosis present

## 2019-05-03 DIAGNOSIS — M7989 Other specified soft tissue disorders: Secondary | ICD-10-CM

## 2019-05-03 DIAGNOSIS — E119 Type 2 diabetes mellitus without complications: Secondary | ICD-10-CM

## 2019-05-03 DIAGNOSIS — J9601 Acute respiratory failure with hypoxia: Secondary | ICD-10-CM | POA: Diagnosis not present

## 2019-05-03 DIAGNOSIS — N179 Acute kidney failure, unspecified: Secondary | ICD-10-CM

## 2019-05-03 DIAGNOSIS — E1165 Type 2 diabetes mellitus with hyperglycemia: Secondary | ICD-10-CM | POA: Diagnosis not present

## 2019-05-03 DIAGNOSIS — Z23 Encounter for immunization: Secondary | ICD-10-CM

## 2019-05-03 DIAGNOSIS — U071 COVID-19: Secondary | ICD-10-CM

## 2019-05-03 HISTORY — DX: Acute upper respiratory infection, unspecified: J06.9

## 2019-05-03 HISTORY — DX: COVID-19: U07.1

## 2019-05-03 LAB — COMPREHENSIVE METABOLIC PANEL
ALT: 41 U/L (ref 0–44)
AST: 58 U/L — ABNORMAL HIGH (ref 15–41)
Albumin: 2.7 g/dL — ABNORMAL LOW (ref 3.5–5.0)
Alkaline Phosphatase: 81 U/L (ref 38–126)
Anion gap: 14 (ref 5–15)
BUN: 43 mg/dL — ABNORMAL HIGH (ref 6–20)
CO2: 18 mmol/L — ABNORMAL LOW (ref 22–32)
Calcium: 8.3 mg/dL — ABNORMAL LOW (ref 8.9–10.3)
Chloride: 100 mmol/L (ref 98–111)
Creatinine, Ser: 2.63 mg/dL — ABNORMAL HIGH (ref 0.61–1.24)
GFR calc Af Amer: 31 mL/min — ABNORMAL LOW (ref >60)
GFR calc non Af Amer: 27 mL/min — ABNORMAL LOW (ref >60)
Glucose, Bld: 426 mg/dL — ABNORMAL HIGH (ref 70–99)
Potassium: 5 mmol/L (ref 3.5–5.1)
Sodium: 132 mmol/L — ABNORMAL LOW (ref 135–145)
Total Bilirubin: 1 mg/dL (ref 0.3–1.2)
Total Protein: 6.2 g/dL — ABNORMAL LOW (ref 6.5–8.1)

## 2019-05-03 LAB — URINALYSIS, ROUTINE W REFLEX MICROSCOPIC
Bacteria, UA: NONE SEEN
Bilirubin Urine: NEGATIVE
Glucose, UA: >=500 mg/dL — AB
Ketones, ur: NEGATIVE mg/dL
Leukocytes,Ua: NEGATIVE
Nitrite: NEGATIVE
Protein, ur: 100 mg/dL — AB
Specific Gravity, Urine: 1.018 (ref 1.005–1.030)
pH: 5 (ref 5.0–8.0)

## 2019-05-03 LAB — ABO/RH: ABO/RH(D): AB POS

## 2019-05-03 LAB — HEMOGLOBIN A1C
Hgb A1c MFr Bld: 12.4 % — ABNORMAL HIGH (ref 4.8–5.6)
Mean Plasma Glucose: 309.18 mg/dL

## 2019-05-03 LAB — CBC WITH DIFFERENTIAL/PLATELET
Abs Immature Granulocytes: 0.07 10*3/uL (ref 0.00–0.07)
Basophils Absolute: 0 10*3/uL (ref 0.0–0.1)
Basophils Relative: 0 %
Eosinophils Absolute: 0 10*3/uL (ref 0.0–0.5)
Eosinophils Relative: 0 %
HCT: 37 % — ABNORMAL LOW (ref 39.0–52.0)
Hemoglobin: 11.5 g/dL — ABNORMAL LOW (ref 13.0–17.0)
Immature Granulocytes: 1 %
Lymphocytes Relative: 16 %
Lymphs Abs: 1 10*3/uL (ref 0.7–4.0)
MCH: 26.3 pg (ref 26.0–34.0)
MCHC: 31.1 g/dL (ref 30.0–36.0)
MCV: 84.5 fL (ref 80.0–100.0)
Monocytes Absolute: 0.3 10*3/uL (ref 0.1–1.0)
Monocytes Relative: 5 %
Neutro Abs: 4.5 10*3/uL (ref 1.7–7.7)
Neutrophils Relative %: 78 %
Platelets: 294 10*3/uL (ref 150–400)
RBC: 4.38 MIL/uL (ref 4.22–5.81)
RDW: 13.9 % (ref 11.5–15.5)
WBC: 5.8 10*3/uL (ref 4.0–10.5)
nRBC: 0 % (ref 0.0–0.2)

## 2019-05-03 LAB — GLUCOSE, CAPILLARY
Glucose-Capillary: 370 mg/dL — ABNORMAL HIGH (ref 70–99)
Glucose-Capillary: 377 mg/dL — ABNORMAL HIGH (ref 70–99)
Glucose-Capillary: 397 mg/dL — ABNORMAL HIGH (ref 70–99)
Glucose-Capillary: 432 mg/dL — ABNORMAL HIGH (ref 70–99)
Glucose-Capillary: 471 mg/dL — ABNORMAL HIGH (ref 70–99)

## 2019-05-03 LAB — D-DIMER, QUANTITATIVE: D-Dimer, Quant: 2.08 ug/mL-FEU — ABNORMAL HIGH (ref 0.00–0.50)

## 2019-05-03 LAB — PROCALCITONIN: Procalcitonin: 0.51 ng/mL

## 2019-05-03 LAB — MAGNESIUM: Magnesium: 1.7 mg/dL (ref 1.7–2.4)

## 2019-05-03 LAB — HIV ANTIBODY (ROUTINE TESTING W REFLEX): HIV Screen 4th Generation wRfx: NONREACTIVE

## 2019-05-03 LAB — CREATININE, URINE, RANDOM: Creatinine, Urine: 134.87 mg/dL

## 2019-05-03 LAB — BRAIN NATRIURETIC PEPTIDE: B Natriuretic Peptide: 33.2 pg/mL (ref 0.0–100.0)

## 2019-05-03 LAB — SODIUM, URINE, RANDOM: Sodium, Ur: 46 mmol/L

## 2019-05-03 LAB — LACTATE DEHYDROGENASE: LDH: 380 U/L — ABNORMAL HIGH (ref 98–192)

## 2019-05-03 LAB — FERRITIN: Ferritin: 1180 ng/mL — ABNORMAL HIGH (ref 24–336)

## 2019-05-03 LAB — FIBRINOGEN: Fibrinogen: 604 mg/dL — ABNORMAL HIGH (ref 210–475)

## 2019-05-03 LAB — HEPATITIS B SURFACE ANTIGEN: Hepatitis B Surface Ag: NONREACTIVE

## 2019-05-03 LAB — C-REACTIVE PROTEIN: CRP: 13.2 mg/dL — ABNORMAL HIGH (ref <1.0)

## 2019-05-03 MED ORDER — DEXAMETHASONE SODIUM PHOSPHATE 10 MG/ML IJ SOLN
6.0000 mg | INTRAMUSCULAR | Status: DC
  Administered 2019-05-03 – 2019-05-05 (×3): 6 mg via INTRAVENOUS
  Filled 2019-05-03 (×3): qty 1

## 2019-05-03 MED ORDER — HYDROCOD POLST-CPM POLST ER 10-8 MG/5ML PO SUER
5.0000 mL | Freq: Two times a day (BID) | ORAL | Status: DC
  Administered 2019-05-03 – 2019-05-07 (×8): 5 mL via ORAL
  Filled 2019-05-03 (×8): qty 5

## 2019-05-03 MED ORDER — LINAGLIPTIN 5 MG PO TABS
5.0000 mg | ORAL_TABLET | Freq: Every day | ORAL | Status: DC
  Administered 2019-05-03 – 2019-05-07 (×5): 5 mg via ORAL
  Filled 2019-05-03 (×5): qty 1

## 2019-05-03 MED ORDER — SODIUM CHLORIDE 0.9 % IV SOLN: INTRAVENOUS | Status: DC

## 2019-05-03 MED ORDER — SODIUM CHLORIDE 0.9% FLUSH
3.0000 mL | Freq: Two times a day (BID) | INTRAVENOUS | Status: DC
  Administered 2019-05-03 – 2019-05-06 (×3): 3 mL via INTRAVENOUS

## 2019-05-03 MED ORDER — SODIUM CHLORIDE 0.9 % IV SOLN
500.0000 mg | INTRAVENOUS | Status: DC
  Administered 2019-05-03 – 2019-05-06 (×4): 500 mg via INTRAVENOUS
  Filled 2019-05-03 (×5): qty 500

## 2019-05-03 MED ORDER — GUAIFENESIN-DM 100-10 MG/5ML PO SYRP
10.0000 mL | ORAL_SOLUTION | ORAL | Status: DC | PRN
  Administered 2019-05-03 – 2019-05-05 (×3): 10 mL via ORAL
  Filled 2019-05-03 (×4): qty 10

## 2019-05-03 MED ORDER — INSULIN ASPART 100 UNIT/ML ~~LOC~~ SOLN
8.0000 [IU] | Freq: Once | SUBCUTANEOUS | Status: AC
  Administered 2019-05-03: 8 [IU] via SUBCUTANEOUS

## 2019-05-03 MED ORDER — ENOXAPARIN SODIUM 80 MG/0.8ML ~~LOC~~ SOLN
70.0000 mg | SUBCUTANEOUS | Status: DC
  Administered 2019-05-03 – 2019-05-06 (×4): 70 mg via SUBCUTANEOUS
  Filled 2019-05-03 (×4): qty 0.8

## 2019-05-03 MED ORDER — ENSURE ENLIVE PO LIQD
237.0000 mL | Freq: Two times a day (BID) | ORAL | Status: DC
  Administered 2019-05-03 – 2019-05-07 (×4): 237 mL via ORAL

## 2019-05-03 MED ORDER — SODIUM CHLORIDE 0.9 % IV SOLN
100.0000 mg | INTRAVENOUS | Status: AC
  Administered 2019-05-03 – 2019-05-06 (×4): 100 mg via INTRAVENOUS
  Filled 2019-05-03 (×4): qty 20

## 2019-05-03 MED ORDER — INSULIN GLARGINE 100 UNIT/ML ~~LOC~~ SOLN
30.0000 [IU] | Freq: Every day | SUBCUTANEOUS | Status: DC
  Administered 2019-05-03: 30 [IU] via SUBCUTANEOUS
  Filled 2019-05-03 (×3): qty 0.3

## 2019-05-03 MED ORDER — ONDANSETRON HCL 4 MG/2ML IJ SOLN: 4.0000 mg | Freq: Four times a day (QID) | INTRAMUSCULAR | Status: DC | PRN

## 2019-05-03 MED ORDER — SODIUM CHLORIDE 0.9 % IV SOLN
1.0000 g | INTRAVENOUS | Status: DC
  Administered 2019-05-03 – 2019-05-07 (×5): 1 g via INTRAVENOUS
  Filled 2019-05-03 (×5): qty 10

## 2019-05-03 MED ORDER — LABETALOL HCL 5 MG/ML IV SOLN
10.0000 mg | INTRAVENOUS | Status: DC | PRN
  Administered 2019-05-03 – 2019-05-04 (×2): 10 mg via INTRAVENOUS
  Filled 2019-05-03 (×2): qty 4

## 2019-05-03 MED ORDER — ONDANSETRON HCL 4 MG PO TABS: 4.0000 mg | ORAL_TABLET | Freq: Four times a day (QID) | ORAL | Status: DC | PRN

## 2019-05-03 MED ORDER — INSULIN ASPART 100 UNIT/ML ~~LOC~~ SOLN
0.0000 [IU] | Freq: Three times a day (TID) | SUBCUTANEOUS | Status: DC
  Administered 2019-05-03 (×2): 9 [IU] via SUBCUTANEOUS

## 2019-05-03 MED ORDER — INSULIN ASPART 100 UNIT/ML ~~LOC~~ SOLN
0.0000 [IU] | Freq: Three times a day (TID) | SUBCUTANEOUS | Status: DC
  Administered 2019-05-03 – 2019-05-04 (×4): 20 [IU] via SUBCUTANEOUS

## 2019-05-03 MED ORDER — INSULIN ASPART 100 UNIT/ML ~~LOC~~ SOLN
5.0000 [IU] | Freq: Once | SUBCUTANEOUS | Status: AC
  Administered 2019-05-03: 5 [IU] via SUBCUTANEOUS

## 2019-05-03 MED ORDER — HYDROCOD POLST-CPM POLST ER 10-8 MG/5ML PO SUER: 5.0000 mL | Freq: Two times a day (BID) | ORAL | Status: DC

## 2019-05-03 MED ORDER — INSULIN GLARGINE 100 UNIT/ML ~~LOC~~ SOLN: 10.0000 [IU] | Freq: Every day | SUBCUTANEOUS | Status: DC

## 2019-05-03 MED ORDER — SODIUM CHLORIDE 0.9% FLUSH
3.0000 mL | Freq: Two times a day (BID) | INTRAVENOUS | Status: DC
  Administered 2019-05-03 – 2019-05-06 (×6): 3 mL via INTRAVENOUS

## 2019-05-03 MED ORDER — SODIUM CHLORIDE 0.9 % IV SOLN
250.0000 mL | INTRAVENOUS | Status: DC | PRN
  Administered 2019-05-03 – 2019-05-05 (×2): 250 mL via INTRAVENOUS
  Administered 2019-05-06: 5 mL via INTRAVENOUS

## 2019-05-03 MED ORDER — ENOXAPARIN SODIUM 80 MG/0.8ML ~~LOC~~ SOLN: 65.0000 mg | SUBCUTANEOUS | Status: DC

## 2019-05-03 MED ORDER — INSULIN GLARGINE 100 UNIT/ML ~~LOC~~ SOLN
5.0000 [IU] | Freq: Every day | SUBCUTANEOUS | Status: DC
  Filled 2019-05-03: qty 0.05

## 2019-05-03 MED ORDER — ACETAMINOPHEN 325 MG PO TABS: 650.0000 mg | ORAL_TABLET | Freq: Four times a day (QID) | ORAL | Status: DC | PRN

## 2019-05-03 MED ORDER — SODIUM CHLORIDE 0.9% FLUSH: 3.0000 mL | INTRAVENOUS | Status: DC | PRN

## 2019-05-03 MED ORDER — INFLUENZA VAC SPLIT QUAD 0.5 ML IM SUSY
0.5000 mL | PREFILLED_SYRINGE | INTRAMUSCULAR | Status: AC
  Administered 2019-05-07: 0.5 mL via INTRAMUSCULAR
  Filled 2019-05-03: qty 0.5

## 2019-05-03 MED ORDER — INSULIN ASPART 100 UNIT/ML ~~LOC~~ SOLN
0.0000 [IU] | Freq: Every day | SUBCUTANEOUS | Status: DC
  Administered 2019-05-03: 5 [IU] via SUBCUTANEOUS

## 2019-05-03 NOTE — Progress Notes (Signed)
Verbal order from Dr. [facility] for Tussionex 30mL every 12 hrs PRN.

## 2019-05-03 NOTE — Progress Notes (Signed)
Patient's CBG 471.  NP [place] paged, order for 8 units of Novolog once.

## 2019-05-03 NOTE — Progress Notes (Signed)
blood Sugar 432 Dr. [facility] notified

## 2019-05-03 NOTE — Progress Notes (Signed)
PROGRESS NOTE                                                                                                                                                                                                             Patient Demographics:    Patrick [name], is a 54 y.o. male, DOB - 05/18/1965, PYK:[id number]  Admit date - 05/03/2019   Admitting Physician Vianne Bulls, MD  Outpatient Primary MD for the patient is [place]  LOS - 0   No chief complaint on file.      Brief Narrative    This is a no charge note as patient was seen, admitted earlier today by Dr. Marnette Burgess, chart, imaging and labs were reviewed  HPI: Patrick [name] is a 54 y.o. male with medical history significant for type 2 diabetes mellitus, hypertension, and recent COVID-19 diagnosis, now presenting to the emergency department with worsening fevers, cough, and fatigue.  Patient reports that his symptoms began approximately 8 days ago, he reportedly had a positive COVID-19 test that was confirmed at [facility] ED, went on to develop worsening cough, aches, and fatigue, and presented to the ED last night.  He denies any chest pain, reports chronic bilateral leg swelling but no leg pain, denies abdominal pain, vomiting, or diarrhea, and denies hemoptysis.  [facility] ED Course: Upon arrival to the ED, patient is found to be febrile, tachycardic to 120, slightly tachypneic, and saturating 90% on room air with stable blood pressure.  EKG features sinus tachycardia with rate 122, chest x-ray concerning for multifocal airspace disease, more conspicuous on the right, D-dimer 3049 (<500), and troponin 0.07 (0.04).  Chemistry panel with creatinine 1.90, up from 1.48 in 2019.  Chemistry panel was repeated in the emergency department and creatinine was 2.0.  CBC with normal WBC and hemoglobin of 11.9, normal platelets.  Lactic acid 1.4.  Procalcitonin was 0.72 and CRP 168 (<10).  Patient was given a liter of  normal saline, remdesivir, Decadron, 65 mg Lovenox, Advil, and acetaminophen in the ED.  He was placed on 2 L/min of supplemental oxygen and transferred to [facility] for ongoing evaluation and management.    Subjective:    Patrick [name] today has, No headache, No chest pain, No abdominal pain - No Nausea, No new weakness tingling or numbness, does report mild cough  and dyspnea.   Assessment  & Plan :    Principal Problem:   Acute respiratory disease due to COVID-19 virus Active Problems:   Insulin-requiring or dependent type II diabetes mellitus ([place])   HYPERTENSION, BENIGN ESSENTIAL   Renal insufficiency   COVID-19 with multifocal pneumonia  - Presents with 7-8 days of cough, fatigue, aches, and malaise; had positive COVID test confirmed in [place] ED where he was found to be mildly tachypneic, saturating 90% on rm air, and tachycardic to 120 with multifocal PNA on CXR with more dense consolidation on right and procalcitonin of 0.74  - He as started on 2 Lpm supplemental O2, remdesivir, and Decadron in ED  - Check sputum culture, trend procalcitonin, d-dimer, and CRP, start Rocephin and azithromycin, and continue remdesivir and Decadron   AKI - SCr is 2.0 in ED, up from 1.49 in 2019 , this morning has increased to 2.6 - He was given a liter of NS in ED  continue gentle hydration, will check renal ultrasound, will check urine sodium and creatinine as well - Hold losartan-HCTZ, renally-dose medications, repeat chem panel   Hypertension  - BP at goal  - Hold losartan-HCTZ initially in light of increased creatinine    Type II DM  - No A1c on file; he is prescribed long-acting insulin but only takes metformin at home  - Check CBGs, start linagliptin and insulin    Elevated D-dimer  - D-dimer was 3049 (<500) in [place] ED  - Patient denies chest pain, hemoptysis, or change in chronic leg swelling but was noted to have HR 120 in ED  - He is hemodynamically stable, only  requiring 2 Lpm supplemental O2, and this could be secondary to the viral infection  - CTA chest precluded by renal insufficiency, plan to check LE venous dopplers, follow clinically, and use ppx dose Lovenox for now      COVID-19 Labs  Recent Labs    05/03/19 0653  DDIMER 2.08*  FERRITIN 1,180*  LDH 380*  CRP 13.2*    No results found for: SARSCOV2NAA     Lab Results  Component Value Date   PLT 294 05/03/2019    Antibiotics  :    Anti-infectives (From admission, onward)   Start     Dose/Rate Route Frequency Ordered Stop   05/03/19 1800  remdesivir 100 mg in sodium chloride 0.9 % 100 mL IVPB     100 mg 200 mL/hr over 30 Minutes Intravenous Every 24 hr x 2 05/03/19 0118 05/07/19 1759   05/03/19 0315  cefTRIAXone (ROCEPHIN) 1 g in sodium chloride 0.9 % 100 mL IVPB     1 g 200 mL/hr over 30 Minutes Intravenous Every 24 hours 05/03/19 0301     05/03/19 0315  azithromycin (ZITHROMAX) 500 mg in sodium chloride 0.9 % 250 mL IVPB     500 mg 250 mL/hr over 60 Minutes Intravenous Every 24 hours 05/03/19 0301          Objective:   Vitals:   05/03/19 0100 05/03/19 0356  BP: (!) 151/115 (!) 157/104  Pulse: (!) 112 75  Resp: (!) 24 18  Temp: 98.5 F (36.9 C)   TempSrc: Oral   SpO2: 98% 97%  Weight: (!) 145.8 kg   Height: 6' (1.829 m)     Wt Readings from Last 3 Encounters:  05/03/19 (!) 145.8 kg  11/03/17 (!) 145.2 kg  06/10/17 (!) 145.2 kg     Intake/Output Summary (Last 24 hours) at 05/03/2019  [place] filed at 05/03/2019 0600 Gross per 24 hour  Intake 329.8 ml  Output --  Net 329.8 ml     Physical Exam  Awake Alert, Oriented X 3, No new F.N deficits, Normal affect Symmetrical Chest wall movement, Good air movement bilaterally, CTAB RRR,No Gallops,Rubs or new Murmurs, No Parasternal Heave +ve B.Sounds, Abd Soft, No tenderness,  No rebound - guarding or rigidity. No Cyanosis, Clubbing or edema, No new Rash or bruise      Data Review:     CBC Recent Labs  Lab 05/03/19 0653  WBC 5.8  HGB 11.5*  HCT 37.0*  PLT 294  MCV 84.5  MCH 26.3  MCHC 31.1  RDW 13.9  LYMPHSABS 1.0  MONOABS 0.3  EOSABS 0.0  BASOSABS 0.0    Chemistries  Recent Labs  Lab 05/03/19 0653  NA 132*  K 5.0  CL 100  CO2 18*  GLUCOSE 426*  BUN 43*  CREATININE 2.63*  CALCIUM 8.3*  MG 1.7  AST 58*  ALT 41  ALKPHOS 81  BILITOT 1.0   ------------------------------------------------------------------------------------------------------------------ No results for input(s): CHOL, HDL, LDLCALC, TRIG, CHOLHDL, LDLDIRECT in the last 72 hours.  Lab Results  Component Value Date   HGBA1C 12.4 (H) 05/03/2019   ------------------------------------------------------------------------------------------------------------------ No results for input(s): TSH, T4TOTAL, T3FREE, THYROIDAB in the last 72 hours.  Invalid input(s): FREET3 ------------------------------------------------------------------------------------------------------------------ Recent Labs    05/03/19 0653  FERRITIN 1,180*    Coagulation profile No results for input(s): INR, PROTIME in the last 168 hours.  Recent Labs    05/03/19 0653  DDIMER 2.08*    Cardiac Enzymes No results for input(s): CKMB, TROPONINI, MYOGLOBIN in the last 168 hours.  Invalid input(s): CK ------------------------------------------------------------------------------------------------------------------    Component Value Date/Time   BNP 33.2 05/03/2019 0653    Inpatient Medications  Scheduled Meds: . dexamethasone (DECADRON) injection  6 mg Intravenous Q24H  . enoxaparin (LOVENOX) injection  70 mg Subcutaneous Q24H  . feeding supplement (ENSURE ENLIVE)  237 mL Oral BID BM  . [START ON 05/04/2019] influenza vac split quadrivalent PF  0.5 mL Intramuscular Tomorrow-1000  . insulin aspart  0-5 Units Subcutaneous QHS  . insulin aspart  0-9 Units Subcutaneous TID WC  . insulin glargine  30  Units Subcutaneous Daily  . linagliptin  5 mg Oral Daily  . sodium chloride flush  3 mL Intravenous Q12H  . sodium chloride flush  3 mL Intravenous Q12H   Continuous Infusions: . sodium chloride 250 mL (05/03/19 0346)  . azithromycin 500 mg (05/03/19 0519)  . cefTRIAXone (ROCEPHIN)  IV 1 g (05/03/19 0347)  . remdesivir 100 mg in NS 100 mL     PRN Meds:.sodium chloride, acetaminophen, guaiFENesin-dextromethorphan, labetalol, ondansetron **OR** ondansetron (ZOFRAN) IV, sodium chloride flush  Micro Results No results found for this or any previous visit (from the past 240 hour(s)).  Radiology Reports VAS [place] LOWER EXTREMITY VENOUS (DVT)  Result Date: 05/03/2019  Lower Venous DVTStudy Indications: Elevated ddimer, tachycardia, and Swelling.  Comparison Study: no prior Performing Technologist: Abram Sander RVS  Examination Guidelines: A complete evaluation includes B-mode imaging, spectral Doppler, color Doppler, and power Doppler as needed of all accessible portions of each vessel. Bilateral testing is considered an integral part of a complete examination. Limited examinations for reoccurring indications may be performed as noted. The reflux portion of the exam is performed with the patient in reverse Trendelenburg.  +---------+---------------+---------+-----------+----------+--------------+ RIGHT    CompressibilityPhasicitySpontaneityPropertiesThrombus Aging +---------+---------------+---------+-----------+----------+--------------+ CFV      Full  Yes      Yes                                 +---------+---------------+---------+-----------+----------+--------------+ SFJ      Full                                                        +---------+---------------+---------+-----------+----------+--------------+ FV Prox  Full                                                        +---------+---------------+---------+-----------+----------+--------------+ FV Mid    Full                                                        +---------+---------------+---------+-----------+----------+--------------+ FV DistalFull                                                        +---------+---------------+---------+-----------+----------+--------------+ PFV      Full                                                        +---------+---------------+---------+-----------+----------+--------------+ POP      Full           Yes      Yes                                 +---------+---------------+---------+-----------+----------+--------------+ PTV      Full                                                        +---------+---------------+---------+-----------+----------+--------------+ PERO     Full                                                        +---------+---------------+---------+-----------+----------+--------------+   +---------+---------------+---------+-----------+----------+--------------+ LEFT     CompressibilityPhasicitySpontaneityPropertiesThrombus Aging +---------+---------------+---------+-----------+----------+--------------+ CFV      Full           Yes      Yes                                 +---------+---------------+---------+-----------+----------+--------------+ SFJ  Full                                                        +---------+---------------+---------+-----------+----------+--------------+ FV Prox  Full                                                        +---------+---------------+---------+-----------+----------+--------------+ FV Mid   Full                                                        +---------+---------------+---------+-----------+----------+--------------+ FV DistalFull                                                        +---------+---------------+---------+-----------+----------+--------------+ PFV      Full                                                         +---------+---------------+---------+-----------+----------+--------------+ POP      Full           Yes      Yes                                 +---------+---------------+---------+-----------+----------+--------------+ PTV      Full                                                        +---------+---------------+---------+-----------+----------+--------------+ PERO     Full                                                        +---------+---------------+---------+-----------+----------+--------------+     Summary: BILATERAL: - No evidence of deep vein thrombosis seen in the lower extremities, bilaterally.   *See table(s) above for measurements and observations.    Preliminary      Phillips Climes M.D on 05/03/2019 at 2:31 PM  Between 7am to 7pm - Pager - [phone]  After 7pm go to [email or web address] - password [facility]  [facility] -  Office  [phone]

## 2019-05-03 NOTE — Progress Notes (Signed)
Inpatient Diabetes Program Recommendations  AACE/ADA: New Consensus Statement on Inpatient Glycemic Control (2015)  Target Ranges:  Prepandial:   less than 140 mg/dL      Peak postprandial:   less than 180 mg/dL (1-2 hours)      Critically ill patients:  140 - 180 mg/dL   Lab Results  Component Value Date   GLUCAP 397 (H) 05/03/2019   HGBA1C 12.4 (H) 05/03/2019    Review of Glycemic Control  Results for MELODY, SAVIDGE (MRN [id number]) as of 05/03/2019 16:26  Ref. Range 05/03/2019 01:46 05/03/2019 07:41 05/03/2019 12:03  Glucose-Capillary Latest Ref Range: 70 - 99 mg/dL 370 (H) 377 (H) 397 (H)    Diabetes history: DM2 Outpatient Diabetes medications: Tresiba 30 units daily (has not gotten med filled yet) + Metformin 500 TID  Current orders for Inpatient glycemic control: Novolog 0-20 TID + 0-5 QHS + Lantus 30 QHS + Trajenta 5 QD + Decadron 6 mg QD  Inpatient Diabetes Program Recommendations: Insulin - Meal Coverage: Novolog 5 units TID if eats at least 50% of meal   Thank you, Reche Dixon, RN, BSN Diabetes Coordinator Inpatient Diabetes Program [phone] (team pager from 8a-5p)

## 2019-05-03 NOTE — H&P (Signed)
History and Physical    [name] IEP:[id number] DOB: Oct 02, 1965 DOA: 05/03/2019  PCP: [facility]   Patient coming from: Home   Chief Complaint: Cough, fatigue   HPI: Patrick [name] is a 54 y.o. male with medical history significant for type 2 diabetes mellitus, hypertension, and recent COVID-19 diagnosis, now presenting to the emergency department with worsening fevers, cough, and fatigue.  Patient reports that his symptoms began approximately 8 days ago, he reportedly had a positive COVID-19 test that was confirmed at [facility] ED, went on to develop worsening cough, aches, and fatigue, and presented to the ED last night.  He denies any chest pain, reports chronic bilateral leg swelling but no leg pain, denies abdominal pain, vomiting, or diarrhea, and denies hemoptysis.  [facility] ED Course: Upon arrival to the ED, patient is found to be febrile, tachycardic to 120, slightly tachypneic, and saturating 90% on room air with stable blood pressure.  EKG features sinus tachycardia with rate 122, chest x-ray concerning for multifocal airspace disease, more conspicuous on the right, D-dimer 3049 (<500), and troponin 0.07 (0.04).  Chemistry panel with creatinine 1.90, up from 1.48 in 2019.  Chemistry panel was repeated in the emergency department and creatinine was 2.0.  CBC with normal WBC and hemoglobin of 11.9, normal platelets.  Lactic acid 1.4.  Procalcitonin was 0.72 and CRP 168 (<10).  Patient was given a liter of normal saline, remdesivir, Decadron, 65 mg Lovenox, Advil, and acetaminophen in the ED.  He was placed on 2 L/min of supplemental oxygen and transferred to [facility] for ongoing evaluation and management.  Review of Systems:  All other systems reviewed and apart from HPI, are negative.  Past Medical History:  Diagnosis Date  . Diabetes mellitus   . Gout   . Hypertension     History reviewed. No pertinent surgical history.   reports that he has never smoked. He has  never used smokeless tobacco. He reports that he does not drink alcohol or use drugs.  No Known Allergies  History reviewed. No pertinent family history.   Prior to Admission medications   Medication Sig Start Date End Date Taking? Authorizing Provider  ibuprofen (ADVIL,MOTRIN) 600 MG tablet Take 1 tablet (600 mg total) by mouth every 6 (six) hours as needed. 11/04/17   Charlann Lange, PA-C  indomethacin (INDOCIN) 50 MG capsule Take 50 mg by mouth 3 (three) times daily as needed for chest pain. 08/29/17   [provider]  Insulin Degludec (TRESIBA FLEXTOUCH) 200 UNIT/ML SOPN Inject 30 Units into the skin every morning.    [provider]  losartan-hydrochlorothiazide (HYZAAR) 100-25 MG tablet Take 1 tablet by mouth daily. 10/29/17   [provider]  metFORMIN (GLUCOPHAGE) 500 MG tablet Take 500 mg by mouth 2 (two) times daily with a meal.     [provider]  methocarbamol (ROBAXIN) 750 MG tablet Take 1 tablet (750 mg total) by mouth 3 (three) times daily. 11/04/17   Charlann Lange, PA-C  polyethylene glycol (MIRALAX) packet Take 17 g by mouth daily. 03/18/18   Robyn Haber, MD    Physical Exam: Vitals:   05/03/19 0100  BP: (!) 151/115  Pulse: (!) 112  Resp: (!) 24  Temp: 98.5 F (36.9 C)  TempSrc: Oral  SpO2: 98%  Weight: (!) 145.8 kg  Height: 6' (1.829 m)    Constitutional: NAD, calm  Eyes: PERTLA, lids and conjunctivae normal ENMT: Mucous membranes are moist. Posterior pharynx clear of any exudate or  lesions.   Neck: normal, supple, no masses, no thyromegaly Respiratory: mild tachypnea, no wheezing, no crackles. No pallor or cyanosis.  Cardiovascular: Rate ~110 and regular. Mild lower leg swelling bilaterally.  Abdomen: No distension, no tenderness, soft. Bowel sounds active.  Musculoskeletal: no clubbing / cyanosis. No joint deformity upper and lower extremities.   Skin: no significant rashes, lesions, ulcers. Warm, dry,  well-perfused. Neurologic: No facial asymmetry. Sensation intact. Moving all extremities.  Psychiatric: Alert and oriented. Pleasant and cooperative.    Labs and Imaging on Admission: I have personally reviewed following labs and imaging studies  CBC: No results for input(s): WBC, NEUTROABS, HGB, HCT, MCV, PLT in the last 168 hours. Basic Metabolic Panel: No results for input(s): NA, K, CL, CO2, GLUCOSE, BUN, CREATININE, CALCIUM, MG, PHOS in the last 168 hours. GFR: CrCl cannot be calculated (Patient's most recent lab result is older than the maximum 21 days allowed.). Liver Function Tests: No results for input(s): AST, ALT, ALKPHOS, BILITOT, PROT, ALBUMIN in the last 168 hours. No results for input(s): LIPASE, AMYLASE in the last 168 hours. No results for input(s): AMMONIA in the last 168 hours. Coagulation Profile: No results for input(s): INR, PROTIME in the last 168 hours. Cardiac Enzymes: No results for input(s): CKTOTAL, CKMB, CKMBINDEX, TROPONINI in the last 168 hours. BNP (last 3 results) No results for input(s): PROBNP in the last 8760 hours. HbA1C: No results for input(s): HGBA1C in the last 72 hours. CBG: No results for input(s): GLUCAP in the last 168 hours. Lipid Profile: No results for input(s): CHOL, HDL, LDLCALC, TRIG, CHOLHDL, LDLDIRECT in the last 72 hours. Thyroid Function Tests: No results for input(s): TSH, T4TOTAL, FREET4, T3FREE, THYROIDAB in the last 72 hours. Anemia Panel: No results for input(s): VITAMINB12, FOLATE, FERRITIN, TIBC, IRON, RETICCTPCT in the last 72 hours. Urine analysis:    Component Value Date/Time   COLORURINE YELLOW 11/04/2017 0028   APPEARANCEUR CLEAR 11/04/2017 0028   LABSPEC 1.019 11/04/2017 0028   PHURINE 5.0 11/04/2017 0028   GLUCOSEU 150 (A) 11/04/2017 0028   HGBUR NEGATIVE 11/04/2017 0028   HGBUR negative 01/27/2010 0934   BILIRUBINUR NEGATIVE 11/04/2017 0028   KETONESUR NEGATIVE 11/04/2017 0028   PROTEINUR NEGATIVE  11/04/2017 0028   UROBILINOGEN 0.2 10/26/2013 1130   NITRITE NEGATIVE 11/04/2017 0028   LEUKOCYTESUR NEGATIVE 11/04/2017 0028   Sepsis Labs: @LABRCNTIP (procalcitonin:4,lacticidven:4) )No results found for this or any previous visit (from the past 240 hour(s)).   Radiological Exams on Admission: No results found.  EKG: Independently reviewed. Sinus tachycardia, rate 122.   Assessment/Plan   1. COVID-19 with multifocal pneumonia  - Presents with 7-8 days of cough, fatigue, aches, and malaise; had positive COVID test confirmed in [place] ED where he was found to be mildly tachypneic, saturating 90% on rm air, and tachycardic to 120 with multifocal PNA on CXR with more dense consolidation on right and procalcitonin of 0.74  - He as started on 2 Lpm supplemental O2, remdesivir, and Decadron in ED  - Check sputum culture, trend procalcitonin, d-dimer, and CRP, start Rocephin and azithromycin, and continue remdesivir and Decadron   2. Renal insufficiency  - SCr is 2.0 in ED, up from 1.49 in 2019  - He was given a liter of NS in ED  - Hold losartan-HCTZ, renally-dose medications, repeat chem panel   3. Hypertension  - BP at goal  - Hold losartan-HCTZ initially in light of increased creatinine    4. Type II DM  - No A1c on  file; he is prescribed long-acting insulin but only takes metformin at home  - Check CBGs, start linagliptin and insulin    5. Elevated D-dimer  - D-dimer was 3049 (<500) in [place] ED  - Patient denies chest pain, hemoptysis, or change in chronic leg swelling but was noted to have HR 120 in ED  - He is hemodynamically stable, only requiring 2 Lpm supplemental O2, and this could be secondary to the viral infection  - CTA chest precluded by renal insufficiency, plan to check LE venous dopplers, follow clinically, and use ppx dose Lovenox for now    DVT prophylaxis: Lovenox  Code Status: Full  Family Communication: Discussed with patient   Disposition Plan:  Likely home in 3-4 days pending improvement in respiratory illness  Consults called: None  Admission status: Inpatient    Vianne Bulls, MD [facility] Pager: See [email or web address]  If 7AM-7PM, please contact the daytime attending [email or web address]  05/03/2019, 1:39 AM

## 2019-05-03 NOTE — Progress Notes (Signed)
Lower extremity venous has been completed.   Preliminary results in CV Proc.   Abram Sander 05/03/2019 10:14 AM

## 2019-05-03 NOTE — Progress Notes (Signed)
Patient arrived to 59W with transport from [facility] ~0100.  Alert and oriented x4.  He was able to walk from the stretcher to bed.  Arrived on 2L [place] with O2 >96%.  He denied any pain and said that he was looking forward to going to sleep.  Patient stated that he has not had good sleep because he stays up coughing.  Tachypneic but not labored, tachycardic, and BP elevated.  CBG 370, given 5 units Novolog.  PIV access right hand and left forearm, flushed and saline locked.  Patient is on mobile telemetry box.  He used the bathroom without O2 [place] and did not feel the need to reapply the cannula immediately.  Educated the pateint on when to reapply the nasal cannula.  Dr. Myna Hidalgo came to assess, implementing new orders.  I answered the patient's questions as best as I could regarding possible treatment/length of stay.  Moderate fall risk.  Educated the patient on how/when to call for assistance.

## 2019-05-03 NOTE — Progress Notes (Signed)
Verbal order from Dr. Waldron [facility] to give extra 5 Units once  of novolog along with the 20 Units on the SSI,

## 2019-05-04 ENCOUNTER — Other Ambulatory Visit: Payer: Self-pay

## 2019-05-04 DIAGNOSIS — E119 Type 2 diabetes mellitus without complications: Secondary | ICD-10-CM | POA: Diagnosis not present

## 2019-05-04 DIAGNOSIS — I10 Essential (primary) hypertension: Secondary | ICD-10-CM | POA: Diagnosis not present

## 2019-05-04 DIAGNOSIS — N179 Acute kidney failure, unspecified: Secondary | ICD-10-CM | POA: Diagnosis not present

## 2019-05-04 DIAGNOSIS — U071 COVID-19: Secondary | ICD-10-CM | POA: Diagnosis not present

## 2019-05-04 LAB — CBC WITH DIFFERENTIAL/PLATELET
Abs Immature Granulocytes: 0.13 10*3/uL — ABNORMAL HIGH (ref 0.00–0.07)
Basophils Absolute: 0 10*3/uL (ref 0.0–0.1)
Basophils Relative: 0 %
Eosinophils Absolute: 0 10*3/uL (ref 0.0–0.5)
Eosinophils Relative: 0 %
HCT: 39.2 % (ref 39.0–52.0)
Hemoglobin: 12.4 g/dL — ABNORMAL LOW (ref 13.0–17.0)
Immature Granulocytes: 2 %
Lymphocytes Relative: 16 %
Lymphs Abs: 1 10*3/uL (ref 0.7–4.0)
MCH: 26.6 pg (ref 26.0–34.0)
MCHC: 31.6 g/dL (ref 30.0–36.0)
MCV: 83.9 fL (ref 80.0–100.0)
Monocytes Absolute: 0.4 10*3/uL (ref 0.1–1.0)
Monocytes Relative: 7 %
Neutro Abs: 5 10*3/uL (ref 1.7–7.7)
Neutrophils Relative %: 75 %
Platelets: 374 10*3/uL (ref 150–400)
RBC: 4.67 MIL/uL (ref 4.22–5.81)
RDW: 13.9 % (ref 11.5–15.5)
WBC: 6.7 10*3/uL (ref 4.0–10.5)
nRBC: 0 % (ref 0.0–0.2)

## 2019-05-04 LAB — COMPREHENSIVE METABOLIC PANEL
ALT: 37 U/L (ref 0–44)
AST: 39 U/L (ref 15–41)
Albumin: 2.9 g/dL — ABNORMAL LOW (ref 3.5–5.0)
Alkaline Phosphatase: 96 U/L (ref 38–126)
Anion gap: 14 (ref 5–15)
BUN: 47 mg/dL — ABNORMAL HIGH (ref 6–20)
CO2: 17 mmol/L — ABNORMAL LOW (ref 22–32)
Calcium: 8.8 mg/dL — ABNORMAL LOW (ref 8.9–10.3)
Chloride: 101 mmol/L (ref 98–111)
Creatinine, Ser: 2.35 mg/dL — ABNORMAL HIGH (ref 0.61–1.24)
GFR calc Af Amer: 36 mL/min — ABNORMAL LOW (ref >60)
GFR calc non Af Amer: 31 mL/min — ABNORMAL LOW (ref >60)
Glucose, Bld: 512 mg/dL (ref 70–99)
Potassium: 4.8 mmol/L (ref 3.5–5.1)
Sodium: 132 mmol/L — ABNORMAL LOW (ref 135–145)
Total Bilirubin: 0.4 mg/dL (ref 0.3–1.2)
Total Protein: 6.8 g/dL (ref 6.5–8.1)

## 2019-05-04 LAB — GLUCOSE, CAPILLARY
Glucose-Capillary: 466 mg/dL — ABNORMAL HIGH (ref 70–99)
Glucose-Capillary: 420 mg/dL — ABNORMAL HIGH (ref 70–99)
Glucose-Capillary: 364 mg/dL — ABNORMAL HIGH (ref 70–99)
Glucose-Capillary: 408 mg/dL — ABNORMAL HIGH (ref 70–99)
Glucose-Capillary: 419 mg/dL — ABNORMAL HIGH (ref 70–99)
Glucose-Capillary: 397 mg/dL — ABNORMAL HIGH (ref 70–99)

## 2019-05-04 LAB — STREP PNEUMONIAE URINARY ANTIGEN: Strep Pneumo Urinary Antigen: NEGATIVE

## 2019-05-04 LAB — FERRITIN: Ferritin: 1239 ng/mL — ABNORMAL HIGH (ref 24–336)

## 2019-05-04 LAB — D-DIMER, QUANTITATIVE: D-Dimer, Quant: 1.97 ug/mL-FEU — ABNORMAL HIGH (ref 0.00–0.50)

## 2019-05-04 LAB — C-REACTIVE PROTEIN: CRP: 9.6 mg/dL — ABNORMAL HIGH (ref <1.0)

## 2019-05-04 MED ORDER — INSULIN ASPART 100 UNIT/ML ~~LOC~~ SOLN
8.0000 [IU] | Freq: Once | SUBCUTANEOUS | Status: AC
  Administered 2019-05-04: 8 [IU] via SUBCUTANEOUS

## 2019-05-04 MED ORDER — INSULIN ASPART 100 UNIT/ML ~~LOC~~ SOLN
1.0000 [IU] | Freq: Three times a day (TID) | SUBCUTANEOUS | Status: DC
  Administered 2019-05-05: 6 [IU] via SUBCUTANEOUS
  Administered 2019-05-05 (×2): 10 [IU] via SUBCUTANEOUS
  Administered 2019-05-06: 4 [IU] via SUBCUTANEOUS
  Administered 2019-05-06: 2 [IU] via SUBCUTANEOUS
  Administered 2019-05-06: 17:00:00 6 [IU] via SUBCUTANEOUS
  Administered 2019-05-07: 4 [IU] via SUBCUTANEOUS

## 2019-05-04 MED ORDER — INSULIN ASPART 100 UNIT/ML ~~LOC~~ SOLN: 0.0000 [IU] | SUBCUTANEOUS | Status: DC

## 2019-05-04 MED ORDER — INSULIN GLARGINE 100 UNIT/ML ~~LOC~~ SOLN
30.0000 [IU] | Freq: Every day | SUBCUTANEOUS | Status: DC
  Filled 2019-05-04: qty 0.3

## 2019-05-04 MED ORDER — INSULIN GLARGINE 100 UNIT/ML ~~LOC~~ SOLN
60.0000 [IU] | Freq: Every day | SUBCUTANEOUS | Status: DC
  Administered 2019-05-04: 60 [IU] via SUBCUTANEOUS
  Filled 2019-05-04: qty 0.6

## 2019-05-04 MED ORDER — INSULIN GLARGINE 100 UNIT/ML ~~LOC~~ SOLN
35.0000 [IU] | Freq: Every day | SUBCUTANEOUS | Status: DC
  Administered 2019-05-04 – 2019-05-06 (×3): 35 [IU] via SUBCUTANEOUS
  Filled 2019-05-04 (×4): qty 0.35

## 2019-05-04 MED ORDER — INSULIN GLARGINE 100 UNIT/ML ~~LOC~~ SOLN
80.0000 [IU] | Freq: Every day | SUBCUTANEOUS | Status: DC
  Administered 2019-05-05 – 2019-05-07 (×3): 80 [IU] via SUBCUTANEOUS
  Filled 2019-05-04 (×3): qty 0.8

## 2019-05-04 MED ORDER — AMLODIPINE BESYLATE 10 MG PO TABS
10.0000 mg | ORAL_TABLET | Freq: Every day | ORAL | Status: DC
  Administered 2019-05-04 – 2019-05-07 (×4): 10 mg via ORAL
  Filled 2019-05-04 (×4): qty 1

## 2019-05-04 MED ORDER — SODIUM CHLORIDE 0.9 % IV SOLN: INTRAVENOUS | Status: DC

## 2019-05-04 MED ORDER — INSULIN ASPART 100 UNIT/ML ~~LOC~~ SOLN
8.0000 [IU] | Freq: Three times a day (TID) | SUBCUTANEOUS | Status: DC
  Administered 2019-05-04: 8 [IU] via SUBCUTANEOUS

## 2019-05-04 MED ORDER — INSULIN ASPART 100 UNIT/ML ~~LOC~~ SOLN
15.0000 [IU] | Freq: Three times a day (TID) | SUBCUTANEOUS | Status: DC
  Administered 2019-05-04 – 2019-05-07 (×9): 15 [IU] via SUBCUTANEOUS

## 2019-05-04 MED ORDER — INSULIN GLARGINE 100 UNIT/ML ~~LOC~~ SOLN
20.0000 [IU] | Freq: Once | SUBCUTANEOUS | Status: AC
  Administered 2019-05-04: 20 [IU] via SUBCUTANEOUS
  Filled 2019-05-04: qty 0.2

## 2019-05-04 MED ORDER — INSULIN ASPART 100 UNIT/ML ~~LOC~~ SOLN
0.0000 [IU] | Freq: Every day | SUBCUTANEOUS | Status: DC
  Administered 2019-05-04: 5 [IU] via SUBCUTANEOUS
  Administered 2019-05-05 – 2019-05-06 (×2): 2 [IU] via SUBCUTANEOUS

## 2019-05-04 NOTE — Progress Notes (Signed)
   Vital Signs MEWS/VS Documentation      05/04/2019 1712 05/04/2019 1925 05/04/2019 1927 05/04/2019 2114   MEWS Score:  1  1  1  1    MEWS Score Color:  Nyoka Cowden  Nyoka Cowden  Green  Green   BP:  --  --  --  (!) 123/93   Temp:  --  --  --  97.6 F (36.4 C)   O2 Device:  --  --  --  Room Air   Level of Consciousness:  --  --  Alert  --           Harl Bowie 05/04/2019,11:30 PM

## 2019-05-04 NOTE — Progress Notes (Signed)
@   0411 Received call from lab reporting a CBG 512.   @ 0420 Checked patient's blood sugar, CBG 466.  Notified NP Blount.  Received order for 8 units Novolog.

## 2019-05-04 NOTE — Progress Notes (Signed)
@   0545 Follow-up CBG 420.  Notified NP Blount.

## 2019-05-04 NOTE — Plan of Care (Signed)

## 2019-05-04 NOTE — Progress Notes (Signed)
PROGRESS NOTE                                                                                                                                                                                                             Patient Demographics:    Patrick [name], is a 54 y.o. male, DOB - [birth year], IOM:[id number]  Admit date - 05/03/2019   Admitting Physician Vianne Bulls, MD  Outpatient Primary MD for the patient is Pa, [place]  LOS - 1   No chief complaint on file.      Brief Narrative    HPI: Boden Stucky is a 54 y.o. male with medical history significant for type 2 diabetes mellitus, hypertension, and recent COVID-19 diagnosis, now presenting to the emergency department with worsening fevers, cough, and fatigue.  Patient reports that his symptoms began approximately 8 days ago, he reportedly had a positive COVID-19 test that was confirmed at [facility] ED, went on to develop worsening cough, aches, and fatigue, and presented to the ED last night.  He denies any chest pain, reports chronic bilateral leg swelling but no leg pain, denies abdominal pain, vomiting, or diarrhea, and denies hemoptysis.  [facility] ED Course: Upon arrival to the ED, patient is found to be febrile, tachycardic to 120, slightly tachypneic, and saturating 90% on room air with stable blood pressure.  EKG features sinus tachycardia with rate 122, chest x-ray concerning for multifocal airspace disease, more conspicuous on the right, D-dimer 3049 (<500), and troponin 0.07 (0.04).  Chemistry panel with creatinine 1.90, up from 1.48 in 2019.  Chemistry panel was repeated in the emergency department and creatinine was 2.0.  CBC with normal WBC and hemoglobin of 11.9, normal platelets.  Lactic acid 1.4.  Procalcitonin was 0.72 and CRP 168 (<10).  Patient was given a liter of normal saline, remdesivir, Decadron, 65 mg Lovenox, Advil, and acetaminophen in the ED.  He was placed on 2 L/min of  supplemental oxygen and transferred to [facility] for ongoing evaluation and management.    Subjective:    [name] today has, No headache, No chest pain, No abdominal pain - No Nausea, No new weakness tingling or numbness, does report mild cough and dyspnea.   Assessment  & Plan :    Principal Problem:   Acute respiratory disease due to COVID-19 virus  Active Problems:   Insulin-requiring or dependent type II diabetes mellitus ([place])   HYPERTENSION, BENIGN ESSENTIAL   Renal insufficiency   COVID-19 with multifocal pneumonia  - Presents with 7-8 days of cough, fatigue, aches, and malaise; had positive COVID test confirmed in [place] ED where he was found to be mildly tachypneic, saturating 90% on rm air, and tachycardic to 120 with multifocal PNA on CXR with more dense consolidation on right and procalcitonin of 0.74  -Currently was on room air. -Continue with steroids. -Procalcitonin is mildly elevated at 0.51, continue with IV Rocephin and azithromycin -Continue to trend inflammatory markers closely  COVID-19 Labs  Recent Labs    05/03/19 0653 05/04/19 0316  DDIMER 2.08* 1.97*  FERRITIN 1,180* 1,239*  LDH 380*  --   CRP 13.2* 9.6*    No results found for: SARSCOV2NAA  AKI - Creatinine peaked at 2.6, baseline around 1.5, male is 0.7%, indicative of prerenal, continue with IV fluids . -Renal ultrasound with no evidence of obstruction or chronic kidney disease - Hold losartan-HCTZ, renally-dose medications, repeat chem panel   Hypertension  - Blood pressure elevated, started on Norvasc 10 mg daily - Hold losartan-HCTZ initially in light of increased creatinine    Type II DM  - A1c significantly elevated at 12.4, CBG remains poorly controlled despite increasing his Lantus significantly, will increase Lantus to 80 units daily, will add 15 units before meals, continue with resistant sliding scale . -started on Tradjenta    COVID-19 Labs  Recent Labs     05/03/19 0653 05/04/19 0316  DDIMER 2.08* 1.97*  FERRITIN 1,180* 1,239*  LDH 380*  --   CRP 13.2* 9.6*    No results found for: SARSCOV2NAA     Lab Results  Component Value Date   PLT 374 05/04/2019    Antibiotics  :    Anti-infectives (From admission, onward)   Start     Dose/Rate Route Frequency Ordered Stop   05/03/19 1800  remdesivir 100 mg in sodium chloride 0.9 % 100 mL IVPB     100 mg 200 mL/hr over 30 Minutes Intravenous Every 24 hr x 2 05/03/19 0118 05/07/19 1359   05/03/19 0315  cefTRIAXone (ROCEPHIN) 1 g in sodium chloride 0.9 % 100 mL IVPB     1 g 200 mL/hr over 30 Minutes Intravenous Every 24 hours 05/03/19 0301     05/03/19 0315  azithromycin (ZITHROMAX) 500 mg in sodium chloride 0.9 % 250 mL IVPB     500 mg 250 mL/hr over 60 Minutes Intravenous Every 24 hours 05/03/19 0301          Objective:   Vitals:   05/03/19 1718 05/03/19 2159 05/04/19 0520 05/04/19 0548  BP: (!) 137/97 (!) 140/101 (!) 147/108   Pulse:  89 (!) 112 (!) 108  Resp: 18     Temp: 98.1 F (36.7 C) 98.3 F (36.8 C) 98.7 F (37.1 C)   TempSrc: Oral Oral    SpO2: 98% 98% 96%   Weight:      Height:        Wt Readings from Last 3 Encounters:  05/03/19 (!) 145.8 kg  11/03/17 (!) 145.2 kg  06/10/17 (!) 145.2 kg     Intake/Output Summary (Last 24 hours) at 05/04/2019 1331 Last data filed at 05/04/2019 0400 Gross per 24 hour  Intake 901.71 ml  Output --  Net 901.71 ml     Physical Exam  Awake Alert, Oriented X 3, No new F.N deficits, Normal  affect Symmetrical Chest wall movement, Good air movement bilaterally, CTAB RRR,No Gallops,Rubs or new Murmurs, No Parasternal Heave +ve B.Sounds, Abd Soft, No tenderness, No rebound - guarding or rigidity. No Cyanosis, Clubbing , +1 edema, No new Rash or bruise       Data Review:    CBC Recent Labs  Lab 05/03/19 0653 05/04/19 0316  WBC 5.8 6.7  HGB 11.5* 12.4*  HCT 37.0* 39.2  PLT 294 374  MCV 84.5 83.9  MCH 26.3 26.6   MCHC 31.1 31.6  RDW 13.9 13.9  LYMPHSABS 1.0 1.0  MONOABS 0.3 0.4  EOSABS 0.0 0.0  BASOSABS 0.0 0.0    Chemistries  Recent Labs  Lab 05/03/19 0653 05/04/19 0316  NA 132* 132*  K 5.0 4.8  CL 100 101  CO2 18* 17*  GLUCOSE 426* 512*  BUN 43* 47*  CREATININE 2.63* 2.35*  CALCIUM 8.3* 8.8*  MG 1.7  --   AST 58* 39  ALT 41 37  ALKPHOS 81 96  BILITOT 1.0 0.4   ------------------------------------------------------------------------------------------------------------------ No results for input(s): CHOL, HDL, LDLCALC, TRIG, CHOLHDL, LDLDIRECT in the last 72 hours.  Lab Results  Component Value Date   HGBA1C 12.4 (H) 05/03/2019   ------------------------------------------------------------------------------------------------------------------ No results for input(s): TSH, T4TOTAL, T3FREE, THYROIDAB in the last 72 hours.  Invalid input(s): FREET3 ------------------------------------------------------------------------------------------------------------------ Recent Labs    05/03/19 0653 05/04/19 0316  FERRITIN 1,180* 1,239*    Coagulation profile No results for input(s): INR, PROTIME in the last 168 hours.  Recent Labs    05/03/19 0653 05/04/19 0316  DDIMER 2.08* 1.97*    Cardiac Enzymes No results for input(s): CKMB, TROPONINI, MYOGLOBIN in the last 168 hours.  Invalid input(s): CK ------------------------------------------------------------------------------------------------------------------    Component Value Date/Time   BNP 33.2 05/03/2019 0653    Inpatient Medications  Scheduled Meds: . chlorpheniramine-HYDROcodone  5 mL Oral Q12H  . dexamethasone (DECADRON) injection  6 mg Intravenous Q24H  . enoxaparin (LOVENOX) injection  70 mg Subcutaneous Q24H  . feeding supplement (ENSURE ENLIVE)  237 mL Oral BID BM  . influenza vac split quadrivalent PF  0.5 mL Intramuscular Tomorrow-1000  . insulin aspart  0-20 Units Subcutaneous TID WC  . insulin  aspart  0-5 Units Subcutaneous QHS  . insulin aspart  15 Units Subcutaneous TID WC  . insulin glargine  20 Units Subcutaneous Once  . [START ON 05/05/2019] insulin glargine  80 Units Subcutaneous Daily  . linagliptin  5 mg Oral Daily  . sodium chloride flush  3 mL Intravenous Q12H  . sodium chloride flush  3 mL Intravenous Q12H   Continuous Infusions: . sodium chloride 250 mL (05/03/19 0346)  . sodium chloride 75 mL/hr at 05/04/19 0847  . azithromycin 500 mg (05/04/19 0145)  . cefTRIAXone (ROCEPHIN)  IV 1 g (05/04/19 0327)  . remdesivir 100 mg in NS 100 mL 100 mg (05/03/19 1801)   PRN Meds:.sodium chloride, acetaminophen, guaiFENesin-dextromethorphan, labetalol, ondansetron **OR** ondansetron (ZOFRAN) IV, sodium chloride flush  Micro Results No results found for this or any previous visit (from the past 240 hour(s)).  Radiology Reports US RENAL  Result Date: 05/03/2019 CLINICAL DATA:  Acute kidney injury. EXAM: RENAL / URINARY TRACT ULTRASOUND COMPLETE COMPARISON:  None. FINDINGS: Right Kidney: Renal measurements: 11.0 x 5.1 x 5.6 cm = volume: 163.7 mL . Echogenicity within normal limits. No mass or hydronephrosis visualized. Left Kidney: Renal measurements: 10.1 x 5.8 x 5.3 cm = volume: 159.4 mL. Echogenicity within normal limits. No mass or hydronephrosis visualized.  Bladder: Appears normal for degree of bladder distention. Other: Numerous gallstones. IMPRESSION: 1. Normal sonographic appearance of the kidneys. 2. Incidental note made of multiple gallstones. Electronically Signed   By: Lajean Manes M.D.   On: 05/03/2019 16:14   VAS [place] LOWER EXTREMITY VENOUS (DVT)  Result Date: 05/03/2019  Lower Venous DVTStudy Indications: Elevated ddimer, tachycardia, and Swelling.  Comparison Study: no prior Performing Technologist: Abram Sander RVS  Examination Guidelines: A complete evaluation includes B-mode imaging, spectral Doppler, color Doppler, and power Doppler as needed of all accessible  portions of each vessel. Bilateral testing is considered an integral part of a complete examination. Limited examinations for reoccurring indications may be performed as noted. The reflux portion of the exam is performed with the patient in reverse Trendelenburg.  +---------+---------------+---------+-----------+----------+--------------+ RIGHT    CompressibilityPhasicitySpontaneityPropertiesThrombus Aging +---------+---------------+---------+-----------+----------+--------------+ CFV      Full           Yes      Yes                                 +---------+---------------+---------+-----------+----------+--------------+ SFJ      Full                                                        +---------+---------------+---------+-----------+----------+--------------+ FV Prox  Full                                                        +---------+---------------+---------+-----------+----------+--------------+ FV Mid   Full                                                        +---------+---------------+---------+-----------+----------+--------------+ FV DistalFull                                                        +---------+---------------+---------+-----------+----------+--------------+ PFV      Full                                                        +---------+---------------+---------+-----------+----------+--------------+ POP      Full           Yes      Yes                                 +---------+---------------+---------+-----------+----------+--------------+ PTV      Full                                                        +---------+---------------+---------+-----------+----------+--------------+  PERO     Full                                                        +---------+---------------+---------+-----------+----------+--------------+   +---------+---------------+---------+-----------+----------+--------------+ LEFT      CompressibilityPhasicitySpontaneityPropertiesThrombus Aging +---------+---------------+---------+-----------+----------+--------------+ CFV      Full           Yes      Yes                                 +---------+---------------+---------+-----------+----------+--------------+ SFJ      Full                                                        +---------+---------------+---------+-----------+----------+--------------+ FV Prox  Full                                                        +---------+---------------+---------+-----------+----------+--------------+ FV Mid   Full                                                        +---------+---------------+---------+-----------+----------+--------------+ FV DistalFull                                                        +---------+---------------+---------+-----------+----------+--------------+ PFV      Full                                                        +---------+---------------+---------+-----------+----------+--------------+ POP      Full           Yes      Yes                                 +---------+---------------+---------+-----------+----------+--------------+ PTV      Full                                                        +---------+---------------+---------+-----------+----------+--------------+ PERO     Full                                                        +---------+---------------+---------+-----------+----------+--------------+       Summary: BILATERAL: - No evidence of deep vein thrombosis seen in the lower extremities, bilaterally.   *See table(s) above for measurements and observations. Electronically signed by Harold Barban MD on 05/03/2019 at 4:19:59 PM.    Final      Phillips Climes M.D on 05/04/2019 at 1:31 PM  Between 7am to 7pm - Pager - [phone]  After 7pm go to [email or web address] - password [facility]  [facility] -  Office   [phone]

## 2019-05-04 NOTE — Progress Notes (Signed)
Inpatient Diabetes Program Recommendations  AACE/ADA: New Consensus Statement on Inpatient Glycemic Control (2015)  Target Ranges:  Prepandial:   less than 140 mg/dL      Peak postprandial:   less than 180 mg/dL (1-2 hours)      Critically ill patients:  140 - 180 mg/dL   Lab Results  Component Value Date   GLUCAP 408 (H) 05/04/2019   HGBA1C 12.4 (H) 05/03/2019    Review of Glycemic Control Results for Patrick [name], Patrick [name] (MRN [id number]) as of 05/04/2019 12:52  Ref. Range 05/03/2019 07:41 05/03/2019 12:03 05/03/2019 17:25 05/03/2019 22:02 05/04/2019 04:17 05/04/2019 05:43 05/04/2019 08:24 05/04/2019 11:56  Glucose-Capillary Latest Ref Range: 70 - 99 mg/dL 377 (H) 397 (H) 432 (H) 471 (H) 466 (H) 420 (H) 364 (H) 408 (H)  Diabetes history: DM2 Outpatient Diabetes medications: Tresiba 30 units daily (has not gotten med filled yet) + Metformin 500 TID  Current orders for Inpatient glycemic control: Novolog 0-20 TID + 0-5 QHS + Lantus 60 units daily+ Trajenta 5 QD + Decadron 6 mg QD+ Novolog 8 units tid with meals Inpatient Diabetes Program Recommendations:   Agree with changes in DM medications.  Will attempt to speak with patient regarding A1C.   Thanks  Adah Perl, RN, BC-ADM Inpatient Diabetes Coordinator Pager [phone] (8a-5p)

## 2019-05-04 NOTE — Progress Notes (Addendum)
Initial Nutrition Assessment   RD working remotely.  DOCUMENTATION CODES:   Morbid obesity  INTERVENTION:  Provide Ensure Enlive po BID, each supplement provides 350 kcal and 20 grams of protein  Encourage adequate PO intake.   NUTRITION DIAGNOSIS:   Increased nutrient needs related to acute illness(COVID) as evidenced by estimated needs.  GOAL:   Patient will meet greater than or equal to 90% of their needs  MONITOR:   PO intake, Supplement acceptance, Skin, Weight trends, Labs, I & O's  REASON FOR ASSESSMENT:   Malnutrition Screening Tool    ASSESSMENT:   54 y.o. male with medical history significant for type 2 diabetes mellitus, hypertension, and recent COVID-19 diagnosis, now presenting to the emergency department with worsening fevers, cough, and fatigue.  Pt reports appetite has been improving since admission and he is starting sense of taste is returning. Pt reports eating well at this meals today. Pt reports poor appetite at home over the past week. Wife has been providing pt with soups and ginger ale at home. No significant weight loss per weight records. Pt currently has Ensure ordered. RD to continue with current nutritional supplements to aid in caloric and protein needs.   Unable to complete Nutrition-Focused physical exam at this time.   Labs and medications reviewed. CBG [id number]. Lantus increased and pt started on tradjenta.  Diet Order:   Diet Order            Diet heart healthy/carb modified Room service appropriate? Yes; Fluid consistency: Thin  Diet effective now              EDUCATION NEEDS:   Not appropriate for education at this time  Skin:  Skin Assessment: Reviewed RN Assessment  Last BM:  Unknown  Height:   Ht Readings from Last 1 Encounters:  05/03/19 6' (1.829 m)    Weight:   Wt Readings from Last 1 Encounters:  05/03/19 (!) 145.8 kg    Ideal Body Weight:  80.9 kg  BMI:  Body mass index is 43.59 kg/m.  Estimated  Nutritional Needs:   Kcal:  2150-2300  Protein:  110-125 grams  Fluid:  >/= 2 L/day    Corrin Parker, MS, RD, LDN RD pager number/after hours weekend pager number on Amion.

## 2019-05-05 LAB — COMPREHENSIVE METABOLIC PANEL
ALT: 31 U/L (ref 0–44)
AST: 29 U/L (ref 15–41)
Albumin: 3.1 g/dL — ABNORMAL LOW (ref 3.5–5.0)
Alkaline Phosphatase: 79 U/L (ref 38–126)
Anion gap: 12 (ref 5–15)
BUN: 47 mg/dL — ABNORMAL HIGH (ref 6–20)
CO2: 19 mmol/L — ABNORMAL LOW (ref 22–32)
Calcium: 9 mg/dL (ref 8.9–10.3)
Chloride: 104 mmol/L (ref 98–111)
Creatinine, Ser: 2.01 mg/dL — ABNORMAL HIGH (ref 0.61–1.24)
GFR calc Af Amer: 43 mL/min — ABNORMAL LOW (ref >60)
GFR calc non Af Amer: 37 mL/min — ABNORMAL LOW (ref >60)
Glucose, Bld: 369 mg/dL — ABNORMAL HIGH (ref 70–99)
Potassium: 5.1 mmol/L (ref 3.5–5.1)
Sodium: 135 mmol/L (ref 135–145)
Total Bilirubin: 0.8 mg/dL (ref 0.3–1.2)
Total Protein: 6.8 g/dL (ref 6.5–8.1)

## 2019-05-05 LAB — D-DIMER, QUANTITATIVE: D-Dimer, Quant: 1.5 ug/mL-FEU — ABNORMAL HIGH (ref 0.00–0.50)

## 2019-05-05 LAB — CBC WITH DIFFERENTIAL/PLATELET
Abs Immature Granulocytes: 0.33 10*3/uL — ABNORMAL HIGH (ref 0.00–0.07)
Basophils Absolute: 0 10*3/uL (ref 0.0–0.1)
Basophils Relative: 0 %
Eosinophils Absolute: 0 10*3/uL (ref 0.0–0.5)
Eosinophils Relative: 0 %
HCT: 38.1 % — ABNORMAL LOW (ref 39.0–52.0)
Hemoglobin: 11.9 g/dL — ABNORMAL LOW (ref 13.0–17.0)
Immature Granulocytes: 4 %
Lymphocytes Relative: 13 %
Lymphs Abs: 1.1 10*3/uL (ref 0.7–4.0)
MCH: 26.3 pg (ref 26.0–34.0)
MCHC: 31.2 g/dL (ref 30.0–36.0)
MCV: 84.3 fL (ref 80.0–100.0)
Monocytes Absolute: 0.6 10*3/uL (ref 0.1–1.0)
Monocytes Relative: 7 %
Neutro Abs: 6.9 10*3/uL (ref 1.7–7.7)
Neutrophils Relative %: 76 %
Platelets: 422 10*3/uL — ABNORMAL HIGH (ref 150–400)
RBC: 4.52 MIL/uL (ref 4.22–5.81)
RDW: 14.1 % (ref 11.5–15.5)
WBC: 9 10*3/uL (ref 4.0–10.5)
nRBC: 0 % (ref 0.0–0.2)

## 2019-05-05 LAB — GLUCOSE, CAPILLARY
Glucose-Capillary: 290 mg/dL — ABNORMAL HIGH (ref 70–99)
Glucose-Capillary: 232 mg/dL — ABNORMAL HIGH (ref 70–99)
Glucose-Capillary: 243 mg/dL — ABNORMAL HIGH (ref 70–99)
Glucose-Capillary: 185 mg/dL — ABNORMAL HIGH (ref 70–99)
Glucose-Capillary: 218 mg/dL — ABNORMAL HIGH (ref 70–99)

## 2019-05-05 LAB — C-REACTIVE PROTEIN: CRP: 4.5 mg/dL — ABNORMAL HIGH (ref <1.0)

## 2019-05-05 LAB — FERRITIN: Ferritin: 1214 ng/mL — ABNORMAL HIGH (ref 24–336)

## 2019-05-05 MED ORDER — LIVING WELL WITH DIABETES BOOK
Freq: Once | Status: AC
  Filled 2019-05-05: qty 1

## 2019-05-05 MED ORDER — SIMETHICONE 80 MG PO CHEW
160.0000 mg | CHEWABLE_TABLET | Freq: Four times a day (QID) | ORAL | Status: DC | PRN
  Administered 2019-05-05 – 2019-05-06 (×4): 160 mg via ORAL
  Filled 2019-05-05 (×4): qty 2

## 2019-05-05 MED ORDER — METOCLOPRAMIDE HCL 5 MG/ML IJ SOLN
10.0000 mg | Freq: Once | INTRAMUSCULAR | Status: AC
  Administered 2019-05-05: 10 mg via INTRAVENOUS
  Filled 2019-05-05: qty 2

## 2019-05-05 NOTE — Plan of Care (Signed)

## 2019-05-05 NOTE — Progress Notes (Signed)
Inpatient Diabetes Program Recommendations  AACE/ADA: New Consensus Statement on Inpatient Glycemic Control (2015)  Target Ranges:  Prepandial:   less than 140 mg/dL      Peak postprandial:   less than 180 mg/dL (1-2 hours)      Critically ill patients:  140 - 180 mg/dL   Lab Results  Component Value Date   GLUCAP 243 (H) 05/05/2019   HGBA1C 12.4 (H) 05/03/2019    Review of Glycemic Control Results for Patrick [name], POEHLER (MRN [id number]) as of 05/05/2019 13:26  Ref. Range 05/04/2019 16:13 05/04/2019 21:05 05/05/2019 03:19 05/05/2019 07:45 05/05/2019 11:49  Glucose-Capillary Latest Ref Range: 70 - 99 mg/dL 419 (H) 397 (H) 290 (H) 232 (H) 243 (H)   Diabetes history: DM 2 Outpatient Diabetes medications:  Tresiba 30 units daily Metformin 500 mg tid with meals Current orders for Inpatient glycemic control:  Novolog 2-24 units tid with meals and HS, Novolog 15 units tid with meals, Lantus 80 units q AM and Lantus 35 units q PM  Inpatient Diabetes Program Recommendations:   Blood sugars improved today however still slightly >goal.  Called and spoke with patient by phone regarding his A1C and diabetes control.  Patient states that he was not taking his insulin consistently at home.  He states that he has had DM for over 20 years and has been on insulin off and on.  Discussed A1C results and that average blood sugars approximately 305 mg/dL.  He verbalized understanding stating that his A1C previously was in the 11% range.  We discussed why it is important to control blood sugars and the long term complications.  Further discussed goal A1C of 7% (average 150 mg/dL).  Patient states that he is active with his job at [employer] but needs to go to the gym more often.  He also reports that he needs better consistency with diet, medications and activity.  Briefly reviewed importance of omitting sugar from his beverages.  He is not checking his blood sugars at home. Needs Rx. For glucose meter/stips/lancets at home as  well.  Recommended that he check at least 2 times a day and report back to PCP if blood sugars consistently high.  Also patient may benefit from f/u with an Endocrinologist?    Thanks,  Adah Perl, RN, BC-ADM Inpatient Diabetes Coordinator Pager [phone] (8a-5p)

## 2019-05-05 NOTE — Progress Notes (Signed)
   Vital Signs MEWS/VS Documentation      05/05/2019 1217 05/05/2019 1230 05/05/2019 2047 05/05/2019 2300   MEWS Score:  2  1  1  1    MEWS Score Color:  Yellow  Green  Green  Green   Resp:  20  --  --  16   Pulse:  (!) 110  --  --  (!) 108   BP:  121/87  --  --  (!) 145/108   Temp:  97.8 F (36.6 C)  --  --  97.6 F (36.4 C)   O2 Device:  Room Air  --  --  Room Air   Level of Consciousness:  Alert  --  Alert  --           Harl Bowie 05/05/2019,11:19 PM

## 2019-05-05 NOTE — Progress Notes (Signed)
Nutrition Follow-up  DOCUMENTATION CODES:   Morbid obesity  INTERVENTION:  Continue Ensure Enlive po BID, each supplement provides 350 kcal and 20 grams of protein  Encourage adequate PO intake  Diabetes diet handout placed in discharge instructions.   NUTRITION DIAGNOSIS:   Increased nutrient needs related to acute illness(COVID) as evidenced by estimated needs; ongoing  GOAL:   Patient will meet greater than or equal to 90% of their needs; progressing  MONITOR:   PO intake, Supplement acceptance, Skin, Weight trends, Labs, I & O's  REASON FOR ASSESSMENT:   Malnutrition Screening Tool    ASSESSMENT:   54 y.o. male with medical history significant for type 2 diabetes mellitus, hypertension, and recent COVID-19 diagnosis, now presenting to the emergency department with worsening fevers, cough, and fatigue.  RD working remotely.  Appetite improving. Meal completion 100% at breakfast this morning. Pt currently has Ensure ordered and requests to continue with supplementation. RD to continue with current orders to aid in caloric and protein needs. Noted A1C 12.4%. Diet handout "Counting Carbohydrate with People with Diabetes" from the Academy of Nutrition and Dietetics Manual placed in pt discharge instructions. Pt motivated to better control his blood glucose with improvement in his diet and physical activity.   Labs and medications reviewed.   Diet Order:   Diet Order            Diet heart healthy/carb modified Room service appropriate? Yes; Fluid consistency: Thin  Diet effective now              EDUCATION NEEDS:   Not appropriate for education at this time  Skin:  Skin Assessment: Reviewed RN Assessment  Last BM:  3/14  Height:   Ht Readings from Last 1 Encounters:  05/03/19 6' (1.829 m)    Weight:   Wt Readings from Last 1 Encounters:  05/03/19 (!) 145.8 kg    BMI:  Body mass index is 43.59 kg/m.  Estimated Nutritional Needs:   Kcal:   2150-2300  Protein:  110-125 grams  Fluid:  >/= 2 L/day    Corrin Parker, MS, RD, LDN RD pager number/after hours weekend pager number on Amion.

## 2019-05-05 NOTE — Progress Notes (Signed)
PROGRESS NOTE                                                                                                                                                                                                             Patient Demographics:    Patrick [name], is a 54 y.o. male, DOB - 10-14-1965, CBJ:[id number]  Admit date - 05/03/2019   Admitting Physician Vianne Bulls, MD  Outpatient Primary MD for the patient is [place], [place]  LOS - 2   No chief complaint on file.      Brief Narrative     54 y.o. male with medical history significant for type 2 diabetes mellitus, hypertension, and recent COVID-19 diagnosis, now presenting to the emergency department with worsening fevers, cough, and fatigue.  8 days before presentation to [facility] ED, he tested positive for COVID-19 and signs of ED, he was transferred to [facility] for further management.  [facility] ED Course: Upon arrival to the ED, patient is found to be febrile, tachycardic to 120, slightly tachypneic, and saturating 90% on room air with stable blood pressure.  EKG features sinus tachycardia with rate 122, chest x-ray concerning for multifocal airspace disease, more conspicuous on the right, D-dimer 3049 (<500), and troponin 0.07 (0.04).  Chemistry panel with creatinine 1.90, up from 1.48 in 2019.  Chemistry panel was repeated in the emergency department and creatinine was 2.0.  CBC with normal WBC and hemoglobin of 11.9, normal platelets.  Lactic acid 1.4.  Procalcitonin was 0.72 and CRP 168 (<10).  Patient was given a liter of normal saline, remdesivir, Decadron, 65 mg Lovenox, Advil, and acetaminophen in the ED.  He was placed on 2 L/min of supplemental oxygen and transferred to [facility] for ongoing evaluation and management.    Subjective:    Patrick [name] today denies any cough or dyspnea, no chest pain, no nausea or vomiting , does report some bloating and hiccups .   Assessment  &  Plan :    Principal Problem:   Acute respiratory disease due to COVID-19 virus Active Problems:   Insulin-requiring or dependent type II diabetes mellitus ([place])   HYPERTENSION, BENIGN ESSENTIAL   Renal insufficiency   COVID-19 with multifocal pneumonia  - Presents with 7-8 days of cough, fatigue, aches, and malaise; had positive COVID test confirmed in [place]  ED where he was found to be mildly tachypneic, saturating 90% on rm air, and tachycardic to 120 with multifocal PNA on CXR with more dense consolidation on right and procalcitonin of 0.74  -Currently on room air. -Continue with steroids. -Continue with IV remdesivir, day 4/5. -Procalcitonin is mildly elevated at 0.51, treated with IV Rocephin and azithromycin -Continue to trend inflammatory markers closely , ending down which is reassuring  COVID-19 Labs  Recent Labs    05/03/19 0653 05/04/19 0316 05/05/19 0054  DDIMER 2.08* 1.97* 1.50*  FERRITIN 1,180* 1,239* 1,214*  LDH 380*  --   --   CRP 13.2* 9.6* 4.5*    No results found for: SARSCOV2NAA  AKI - Creatinine peaked at 2.6, baseline around 1.5, morning did improve to do with IV fluids . - FENa 0.7%, indicative of prerenal, received  IV fluids . - Renal ultrasound with no evidence of obstruction or chronic kidney disease - Hold losartan-HCTZ, renally-dose medications, repeat chem panel   Hypertension  - Blood pressure elevated, started on Norvasc 10 mg daily - Hold losartan-HCTZ initially in light of increased creatinine    Type II DM  - A1c significantly elevated at 12.4, CBG remains poorly controlled despite increasing his Lantus significantly, his numbers currently improved with 80 units Lantus daily, and 35 units nightly, have change him to custom sliding scale, and increase his NovoLog to 15 units 3 times daily. -I have discussed with the patient, his [place]more Pali will need to be increased at time of discharge. -started on Tradjenta    COVID-19  Labs  Recent Labs    05/03/19 0653 05/04/19 0316 05/05/19 0054  DDIMER 2.08* 1.97* 1.50*  FERRITIN 1,180* 1,239* 1,214*  LDH 380*  --   --   CRP 13.2* 9.6* 4.5*    No results found for: [place]: Full  Disposition plan: Home [place] after finishing his IV remdesivir, and if renal function remains stable.  Consults: None  Procedures: None  DVT prophylaxis: [place] lovenox   Lab Results  Component Value Date   PLT 422 (H) 05/05/2019    Antibiotics  :    Anti-infectives (From admission, onward)   Start     Dose/Rate Route Frequency Ordered Stop   05/03/19 1800  remdesivir 100 mg in sodium chloride 0.9 % 100 mL IVPB     100 mg 200 mL/hr over 30 Minutes Intravenous Every 24 hr x 2 05/03/19 0118 05/07/19 0959   05/03/19 0315  cefTRIAXone (ROCEPHIN) 1 g in sodium chloride 0.9 % 100 mL IVPB     1 g 200 mL/hr over 30 Minutes Intravenous Every 24 hours 05/03/19 0301     05/03/19 0315  azithromycin (ZITHROMAX) 500 mg in sodium chloride 0.9 % 250 mL IVPB     500 mg 250 mL/hr over 60 Minutes Intravenous Every 24 hours 05/03/19 0301          Objective:   Vitals:   05/04/19 1656 05/04/19 2114 05/05/19 0612 05/05/19 1217  BP: (!) 134/92 (!) 123/93 (!) 145/113 121/87  Pulse:   (!) 107 (!) 110  Resp:   20 20  Temp: 98 F (36.7 C) 97.6 F (36.4 C) 97.9 F (36.6 C) 97.8 F (36.6 C)  TempSrc: Oral Oral Oral Oral  SpO2:  99% 96% 97%  Weight:      Height:        Wt Readings from Last 3 Encounters:  05/03/19 (!) 145.8 kg  11/03/17 (!) 145.2 kg  06/10/17 Marland Kitchen)  145.2 kg     Intake/Output Summary (Last 24 hours) at 05/05/2019 1423 Last data filed at 05/05/2019 0900 Gross per 24 hour  Intake 2399.36 ml  Output --  Net 2399.36 ml     Physical Exam  Awake Alert, Oriented X 3, No new F.N deficits, Normal affect Symmetrical Chest wall movement, Good air movement bilaterally, CTAB RRR,No Gallops,Rubs or new Murmurs, No Parasternal Heave +ve B.Sounds, Abd  Soft, No tenderness, No rebound - guarding or rigidity. No Cyanosis, Clubbing , +1edema, No new Rash or bruise      Data Review:    CBC Recent Labs  Lab 05/03/19 0653 05/04/19 0316 05/05/19 0054  WBC 5.8 6.7 9.0  HGB 11.5* 12.4* 11.9*  HCT 37.0* 39.2 38.1*  PLT 294 374 422*  MCV 84.5 83.9 84.3  MCH 26.3 26.6 26.3  MCHC 31.1 31.6 31.2  RDW 13.9 13.9 14.1  LYMPHSABS 1.0 1.0 1.1  MONOABS 0.3 0.4 0.6  EOSABS 0.0 0.0 0.0  BASOSABS 0.0 0.0 0.0    Chemistries  Recent Labs  Lab 05/03/19 0653 05/04/19 0316 05/05/19 0054  NA 132* 132* 135  K 5.0 4.8 5.1  CL 100 101 104  CO2 18* 17* 19*  GLUCOSE 426* 512* 369*  BUN 43* 47* 47*  CREATININE 2.63* 2.35* 2.01*  CALCIUM 8.3* 8.8* 9.0  MG 1.7  --   --   AST 58* 39 29  ALT 41 37 31  ALKPHOS 81 96 79  BILITOT 1.0 0.4 0.8   ------------------------------------------------------------------------------------------------------------------ No results for input(s): CHOL, HDL, LDLCALC, TRIG, CHOLHDL, LDLDIRECT in the last 72 hours.  Lab Results  Component Value Date   HGBA1C 12.4 (H) 05/03/2019   ------------------------------------------------------------------------------------------------------------------ No results for input(s): TSH, T4TOTAL, T3FREE, THYROIDAB in the last 72 hours.  Invalid input(s): FREET3 ------------------------------------------------------------------------------------------------------------------ Recent Labs    05/04/19 0316 05/05/19 0054  FERRITIN 1,239* 1,214*    Coagulation profile No results for input(s): INR, PROTIME in the last 168 hours.  Recent Labs    05/04/19 0316 05/05/19 0054  DDIMER 1.97* 1.50*    Cardiac Enzymes No results for input(s): CKMB, TROPONINI, MYOGLOBIN in the last 168 hours.  Invalid input(s): CK ------------------------------------------------------------------------------------------------------------------    Component Value Date/Time   BNP 33.2  05/03/2019 0653    Inpatient Medications  Scheduled Meds: . amLODipine  10 mg Oral Daily  . chlorpheniramine-HYDROcodone  5 mL Oral Q12H  . dexamethasone (DECADRON) injection  6 mg Intravenous Q24H  . enoxaparin (LOVENOX) injection  70 mg Subcutaneous Q24H  . feeding supplement (ENSURE ENLIVE)  237 mL Oral BID BM  . influenza vac split quadrivalent PF  0.5 mL Intramuscular Tomorrow-1000  . insulin aspart  0-5 Units Subcutaneous QHS  . insulin aspart  1 Units Subcutaneous TID WC  . insulin aspart  15 Units Subcutaneous TID WC  . insulin glargine  35 Units Subcutaneous QHS  . insulin glargine  80 Units Subcutaneous Daily  . linagliptin  5 mg Oral Daily  . living well with diabetes book   Does not apply Once  . sodium chloride flush  3 mL Intravenous Q12H  . sodium chloride flush  3 mL Intravenous Q12H   Continuous Infusions: . sodium chloride Stopped (05/05/19 1138)  . azithromycin Stopped (05/05/19 0432)  . cefTRIAXone (ROCEPHIN)  IV Stopped (05/05/19 0403)  . remdesivir 100 mg in NS 100 mL 100 mg (05/05/19 1046)   PRN Meds:.sodium chloride, acetaminophen, guaiFENesin-dextromethorphan, labetalol, ondansetron **OR** ondansetron (ZOFRAN) IV, simethicone, sodium chloride flush  Micro  Results No results found for this or any previous visit (from the past 240 hour(s)).  Radiology Reports US RENAL  Result Date: 05/03/2019 CLINICAL DATA:  Acute kidney injury. EXAM: RENAL / URINARY TRACT ULTRASOUND COMPLETE COMPARISON:  None. FINDINGS: Right Kidney: Renal measurements: 11.0 x 5.1 x 5.6 cm = volume: 163.7 mL . Echogenicity within normal limits. No mass or hydronephrosis visualized. Left Kidney: Renal measurements: 10.1 x 5.8 x 5.3 cm = volume: 159.4 mL. Echogenicity within normal limits. No mass or hydronephrosis visualized. Bladder: Appears normal for degree of bladder distention. Other: Numerous gallstones. IMPRESSION: 1. Normal sonographic appearance of the kidneys. 2. Incidental note  made of multiple gallstones. Electronically Signed   By: Lajean Manes M.D.   On: 05/03/2019 16:14   VAS [place] LOWER EXTREMITY VENOUS (DVT)  Result Date: 05/03/2019  Lower Venous DVTStudy Indications: Elevated ddimer, tachycardia, and Swelling.  Comparison Study: no prior Performing Technologist: Abram Sander RVS  Examination Guidelines: A complete evaluation includes B-mode imaging, spectral Doppler, color Doppler, and power Doppler as needed of all accessible portions of each vessel. Bilateral testing is considered an integral part of a complete examination. Limited examinations for reoccurring indications may be performed as noted. The reflux portion of the exam is performed with the patient in reverse Trendelenburg.  +---------+---------------+---------+-----------+----------+--------------+ RIGHT    CompressibilityPhasicitySpontaneityPropertiesThrombus Aging +---------+---------------+---------+-----------+----------+--------------+ CFV      Full           Yes      Yes                                 +---------+---------------+---------+-----------+----------+--------------+ SFJ      Full                                                        +---------+---------------+---------+-----------+----------+--------------+ FV Prox  Full                                                        +---------+---------------+---------+-----------+----------+--------------+ FV Mid   Full                                                        +---------+---------------+---------+-----------+----------+--------------+ FV DistalFull                                                        +---------+---------------+---------+-----------+----------+--------------+ PFV      Full                                                        +---------+---------------+---------+-----------+----------+--------------+ POP      Full  Yes      Yes                                  +---------+---------------+---------+-----------+----------+--------------+ PTV      Full                                                        +---------+---------------+---------+-----------+----------+--------------+ PERO     Full                                                        +---------+---------------+---------+-----------+----------+--------------+   +---------+---------------+---------+-----------+----------+--------------+ LEFT     CompressibilityPhasicitySpontaneityPropertiesThrombus Aging +---------+---------------+---------+-----------+----------+--------------+ CFV      Full           Yes      Yes                                 +---------+---------------+---------+-----------+----------+--------------+ SFJ      Full                                                        +---------+---------------+---------+-----------+----------+--------------+ FV Prox  Full                                                        +---------+---------------+---------+-----------+----------+--------------+ FV Mid   Full                                                        +---------+---------------+---------+-----------+----------+--------------+ FV DistalFull                                                        +---------+---------------+---------+-----------+----------+--------------+ PFV      Full                                                        +---------+---------------+---------+-----------+----------+--------------+ POP      Full           Yes      Yes                                 +---------+---------------+---------+-----------+----------+--------------+ PTV  Full                                                        +---------+---------------+---------+-----------+----------+--------------+ PERO     Full                                                         +---------+---------------+---------+-----------+----------+--------------+     Summary: BILATERAL: - No evidence of deep vein thrombosis seen in the lower extremities, bilaterally.   *See table(s) above for measurements and observations. Electronically signed by Harold Barban MD on 05/03/2019 at 4:19:59 PM.    Final      Phillips Climes M.D on 05/05/2019 at 2:23 PM  Between 7am to 7pm - Pager - [phone]  After 7pm go to [email or web address] - password [facility]  [facility] -  Office  [phone]

## 2019-05-06 DIAGNOSIS — N179 Acute kidney failure, unspecified: Secondary | ICD-10-CM

## 2019-05-06 DIAGNOSIS — E119 Type 2 diabetes mellitus without complications: Secondary | ICD-10-CM

## 2019-05-06 DIAGNOSIS — U071 COVID-19: Secondary | ICD-10-CM | POA: Diagnosis not present

## 2019-05-06 DIAGNOSIS — I10 Essential (primary) hypertension: Secondary | ICD-10-CM | POA: Diagnosis not present

## 2019-05-06 DIAGNOSIS — Z794 Long term (current) use of insulin: Secondary | ICD-10-CM

## 2019-05-06 LAB — CBC WITH DIFFERENTIAL/PLATELET
Abs Immature Granulocytes: 0.45 10*3/uL — ABNORMAL HIGH (ref 0.00–0.07)
Basophils Absolute: 0 10*3/uL (ref 0.0–0.1)
Basophils Relative: 0 %
Eosinophils Absolute: 0 10*3/uL (ref 0.0–0.5)
Eosinophils Relative: 0 %
HCT: 36.9 % — ABNORMAL LOW (ref 39.0–52.0)
Hemoglobin: 11.6 g/dL — ABNORMAL LOW (ref 13.0–17.0)
Immature Granulocytes: 5 %
Lymphocytes Relative: 15 %
Lymphs Abs: 1.5 10*3/uL (ref 0.7–4.0)
MCH: 26.5 pg (ref 26.0–34.0)
MCHC: 31.4 g/dL (ref 30.0–36.0)
MCV: 84.4 fL (ref 80.0–100.0)
Monocytes Absolute: 0.7 10*3/uL (ref 0.1–1.0)
Monocytes Relative: 7 %
Neutro Abs: 7.4 10*3/uL (ref 1.7–7.7)
Neutrophils Relative %: 73 %
Platelets: 444 10*3/uL — ABNORMAL HIGH (ref 150–400)
RBC: 4.37 MIL/uL (ref 4.22–5.81)
RDW: 14.1 % (ref 11.5–15.5)
WBC: 10 10*3/uL (ref 4.0–10.5)
nRBC: 0 % (ref 0.0–0.2)

## 2019-05-06 LAB — COMPREHENSIVE METABOLIC PANEL
ALT: 36 U/L (ref 0–44)
AST: 47 U/L — ABNORMAL HIGH (ref 15–41)
Albumin: 2.8 g/dL — ABNORMAL LOW (ref 3.5–5.0)
Alkaline Phosphatase: 78 U/L (ref 38–126)
Anion gap: 11 (ref 5–15)
BUN: 40 mg/dL — ABNORMAL HIGH (ref 6–20)
CO2: 19 mmol/L — ABNORMAL LOW (ref 22–32)
Calcium: 8.8 mg/dL — ABNORMAL LOW (ref 8.9–10.3)
Chloride: 107 mmol/L (ref 98–111)
Creatinine, Ser: 1.64 mg/dL — ABNORMAL HIGH (ref 0.61–1.24)
GFR calc Af Amer: 55 mL/min — ABNORMAL LOW (ref >60)
GFR calc non Af Amer: 47 mL/min — ABNORMAL LOW (ref >60)
Glucose, Bld: 169 mg/dL — ABNORMAL HIGH (ref 70–99)
Potassium: 4.5 mmol/L (ref 3.5–5.1)
Sodium: 137 mmol/L (ref 135–145)
Total Bilirubin: 0.5 mg/dL (ref 0.3–1.2)
Total Protein: 6.3 g/dL — ABNORMAL LOW (ref 6.5–8.1)

## 2019-05-06 LAB — GLUCOSE, CAPILLARY
Glucose-Capillary: 110 mg/dL — ABNORMAL HIGH (ref 70–99)
Glucose-Capillary: 135 mg/dL — ABNORMAL HIGH (ref 70–99)
Glucose-Capillary: 172 mg/dL — ABNORMAL HIGH (ref 70–99)
Glucose-Capillary: 211 mg/dL — ABNORMAL HIGH (ref 70–99)

## 2019-05-06 LAB — D-DIMER, QUANTITATIVE: D-Dimer, Quant: 0.88 ug/mL-FEU — ABNORMAL HIGH (ref 0.00–0.50)

## 2019-05-06 LAB — FERRITIN: Ferritin: 882 ng/mL — ABNORMAL HIGH (ref 24–336)

## 2019-05-06 LAB — C-REACTIVE PROTEIN: CRP: 1.8 mg/dL — ABNORMAL HIGH (ref <1.0)

## 2019-05-06 MED ORDER — DEXAMETHASONE SODIUM PHOSPHATE 4 MG/ML IJ SOLN
4.0000 mg | INTRAMUSCULAR | Status: DC
  Administered 2019-05-06: 4 mg via INTRAVENOUS
  Filled 2019-05-06: qty 1

## 2019-05-06 NOTE — Progress Notes (Signed)
PROGRESS NOTE                                                                                                                                                                                                             Patient Demographics:    Patrick [name], is a 54 y.o. male, DOB - [birth year], LAG:[id number]  Outpatient Primary MD for the patient is [place] date - 05/03/2019   LOS - 3  No chief complaint on file.      Brief Narrative: Patient is a 54 y.o. male with PMHx of DM-2, HTN-who presented with fever, cough, shortness of breath-found to have acute hypoxic respiratory failure secondary to COVID-19 pneumonia.  Significant Events: 3/14>> admit to [facility]  Microbiology data: None  COVID-19 medication: Remdesivir: 3/13>> 3/17 Steroids: 3/13>>  Antibiotics: Rocephin: 3/13>> Zithromax: 3/13>> 3/16  DVT Prophylaxis   Lovenox  Procedures: None  Consults: None   Subjective:    Patrick [name] today feels better-he feels that he can ambulate much more freely than he first came in.  Per patient he could not even walk to the bathroom at home-now he is able to move around with significant less shortness of breath.   Assessment  & Plan :   Acute Hypoxic Resp Failure due to Covid 19 Viral pneumonia.  Pneumonia: Improving-titrated to room air this morning.  Inflammatory markers downtrending.  Will remdesivir today-remains on empiric Rocephin/Zithromax.  Continue steroids-but if he remains on room air-suspect this can be titrated down in the next few days.    Note-lower extremity Dopplers negative for DVT.  Fever: afebrile  O2 requirements:  SpO2: 98 % O2 Flow Rate (L/min): 2 L/min   COVID-19 Labs: Recent Labs    05/04/19 0316 05/05/19 0054 05/06/19 0243  DDIMER 1.97* 1.50* 0.88*  FERRITIN 1,239* 1,214* 882*  CRP 9.6* 4.5* 1.8*       Component Value Date/Time   BNP 33.2 05/03/2019 0653     Recent Labs  Lab 05/03/19 0653  PROCALCITON 0.51    No results found for: SARSCOV2NAA    Prone/Incentive Spirometry: encouraged e incentive spirometry use 3-4/hour.  DVT Prophylaxis  :  Lovenox   AKI on CKD stage IIIa: AKI hemodynamically mediated-improving with supportive care.  Renal ultrasound without any hydronephrosis.  HTN: BP controlled-continue amlodipine  DM-2 with uncontrolled  hyperglycemia secondary to steroids (A1c 12.4 on 05/03/2019): CBGs stable-continue Lantus 80 units in a.m. and 35 units at p.m., 15 units of NovoLog with meals and SSI.  RN instructed to provide diabetic education.  Patient acknowledges poor outpatient compliance to insulin.  Recent Labs    05/05/19 2009 05/06/19 0744 05/06/19 1300  GLUCAP 218* 110* 135*    Nutrition Problem: Nutrition Problem: Increased nutrient needs Etiology: acute illness(COVID) Signs/Symptoms: estimated needs Interventions: Ensure Enlive (each supplement provides 350kcal and 20 grams of protein)  Obesity: Estimated body mass index is 43.59 kg/m as calculated from the following:   Height as of this encounter: 6' (1.829 m).   Weight as of this encounter: 145.8 kg.    ABG: No results found for: PHART, PCO2ART, PO2ART, HCO3, TCO2, ACIDBASEDEF, O2SAT  Vent Settings: N/A    Condition -Stable  Family Communication  : Left a voicemail for patient's spouse  Code Status :  Full Code  Diet :  Diet Order            Diet heart healthy/carb modified Room service appropriate? Yes; Fluid consistency: Thin  Diet effective now               Disposition Plan  : Probably home on 3/18 if clinical improvement continues  Barriers to discharge: Complete 5 days of IV Remdesivir-remains on IV Rocephin  Antimicorbials  :    Anti-infectives (From admission, onward)   Start     Dose/Rate Route Frequency Ordered Stop   05/03/19 1800  remdesivir 100 mg in sodium chloride 0.9 % 100 mL IVPB     100 mg 200 mL/hr over 30  Minutes Intravenous Every 24 hr x 2 05/03/19 0118 05/06/19 0902   05/03/19 0315  cefTRIAXone (ROCEPHIN) 1 g in sodium chloride 0.9 % 100 mL IVPB     1 g 200 mL/hr over 30 Minutes Intravenous Every 24 hours 05/03/19 0301     05/03/19 0315  azithromycin (ZITHROMAX) 500 mg in sodium chloride 0.9 % 250 mL IVPB     500 mg 250 mL/hr over 60 Minutes Intravenous Every 24 hours 05/03/19 0301        Inpatient Medications  Scheduled Meds: . amLODipine  10 mg Oral Daily  . chlorpheniramine-HYDROcodone  5 mL Oral Q12H  . dexamethasone (DECADRON) injection  4 mg Intravenous Q24H  . enoxaparin (LOVENOX) injection  70 mg Subcutaneous Q24H  . feeding supplement (ENSURE ENLIVE)  237 mL Oral BID BM  . influenza vac split quadrivalent PF  0.5 mL Intramuscular Tomorrow-1000  . insulin aspart  0-5 Units Subcutaneous QHS  . insulin aspart  1 Units Subcutaneous TID WC  . insulin aspart  15 Units Subcutaneous TID WC  . insulin glargine  35 Units Subcutaneous QHS  . insulin glargine  80 Units Subcutaneous Daily  . linagliptin  5 mg Oral Daily  . sodium chloride flush  3 mL Intravenous Q12H  . sodium chloride flush  3 mL Intravenous Q12H   Continuous Infusions: . sodium chloride 10 mL/hr at 05/06/19 0414  . azithromycin Stopped (05/06/19 0515)  . cefTRIAXone (ROCEPHIN)  IV Stopped (05/06/19 0355)   PRN Meds:.sodium chloride, acetaminophen, guaiFENesin-dextromethorphan, labetalol, ondansetron **OR** ondansetron (ZOFRAN) IV, simethicone, sodium chloride flush   Time Spent in minutes  25  See all Orders from today for further details   Oren Binet M.D on 05/06/2019 at 3:03 PM  To page go to [email or web address] - use universal password  [facility] -  Office  [phone]  Objective:   Vitals:   05/06/19 0010 05/06/19 0435 05/06/19 0827 05/06/19 1200  BP: (!) 137/100 (!) 127/96 (!) 134/100 (!) 136/97  Pulse:  (!) 105    Resp:  18    Temp: 97.6 F (36.4 C) 97.7 F (36.5 C)  (!) 97.4 F  (36.3 C)  TempSrc: Oral Axillary  Oral  SpO2:  98%    Weight:      Height:        Wt Readings from Last 3 Encounters:  05/03/19 (!) 145.8 kg  11/03/17 (!) 145.2 kg  06/10/17 (!) 145.2 kg     Intake/Output Summary (Last 24 hours) at 05/06/2019 1503 Last data filed at 05/06/2019 0900 Gross per 24 hour  Intake 818.34 ml  Output --  Net 818.34 ml     Physical Exam Gen Exam:Alert awake-not in any distress HEENT:atraumatic, normocephalic Chest: B/L clear to auscultation anteriorly CVS:S1S2 regular Abdomen:soft non tender, non distended Extremities:no edema Neurology: Non focal Skin: no rash   Data Review:    CBC Recent Labs  Lab 05/03/19 0653 05/04/19 0316 05/05/19 0054 05/06/19 0243  WBC 5.8 6.7 9.0 10.0  HGB 11.5* 12.4* 11.9* 11.6*  HCT 37.0* 39.2 38.1* 36.9*  PLT 294 374 422* 444*  MCV 84.5 83.9 84.3 84.4  MCH 26.3 26.6 26.3 26.5  MCHC 31.1 31.6 31.2 31.4  RDW 13.9 13.9 14.1 14.1  LYMPHSABS 1.0 1.0 1.1 1.5  MONOABS 0.3 0.4 0.6 0.7  EOSABS 0.0 0.0 0.0 0.0  BASOSABS 0.0 0.0 0.0 0.0    Chemistries  Recent Labs  Lab 05/03/19 0653 05/04/19 0316 05/05/19 0054 05/06/19 0243  NA 132* 132* 135 137  K 5.0 4.8 5.1 4.5  CL 100 101 104 107  CO2 18* 17* 19* 19*  GLUCOSE 426* 512* 369* 169*  BUN 43* 47* 47* 40*  CREATININE 2.63* 2.35* 2.01* 1.64*  CALCIUM 8.3* 8.8* 9.0 8.8*  MG 1.7  --   --   --   AST 58* 39 29 47*  ALT 41 37 31 36  ALKPHOS 81 96 79 78  BILITOT 1.0 0.4 0.8 0.5   ------------------------------------------------------------------------------------------------------------------ No results for input(s): CHOL, HDL, LDLCALC, TRIG, CHOLHDL, LDLDIRECT in the last 72 hours.  Lab Results  Component Value Date   HGBA1C 12.4 (H) 05/03/2019   ------------------------------------------------------------------------------------------------------------------ No results for input(s): TSH, T4TOTAL, T3FREE, THYROIDAB in the last 72 hours.  Invalid  input(s): FREET3 ------------------------------------------------------------------------------------------------------------------ Recent Labs    05/05/19 0054 05/06/19 0243  FERRITIN 1,214* 882*    Coagulation profile No results for input(s): INR, PROTIME in the last 168 hours.  Recent Labs    05/05/19 0054 05/06/19 0243  DDIMER 1.50* 0.88*    Cardiac Enzymes No results for input(s): CKMB, TROPONINI, MYOGLOBIN in the last 168 hours.  Invalid input(s): CK ------------------------------------------------------------------------------------------------------------------    Component Value Date/Time   BNP 33.2 05/03/2019 0653    Micro Results No results found for this or any previous visit (from the past 240 hour(s)).  Radiology Reports US RENAL  Result Date: 05/03/2019 CLINICAL DATA:  Acute kidney injury. EXAM: RENAL / URINARY TRACT ULTRASOUND COMPLETE COMPARISON:  None. FINDINGS: Right Kidney: Renal measurements: 11.0 x 5.1 x 5.6 cm = volume: 163.7 mL . Echogenicity within normal limits. No mass or hydronephrosis visualized. Left Kidney: Renal measurements: 10.1 x 5.8 x 5.3 cm = volume: 159.4 mL. Echogenicity within normal limits. No mass or hydronephrosis visualized. Bladder: Appears normal for degree of bladder distention. Other: Numerous gallstones. IMPRESSION: 1. Normal sonographic  appearance of the kidneys. 2. Incidental note made of multiple gallstones. Electronically Signed   By: Lajean Manes M.D.   On: 05/03/2019 16:14   VAS [place] LOWER EXTREMITY VENOUS (DVT)  Result Date: 05/03/2019  Lower Venous DVTStudy Indications: Elevated ddimer, tachycardia, and Swelling.  Comparison Study: no prior Performing Technologist: Abram Sander RVS  Examination Guidelines: A complete evaluation includes B-mode imaging, spectral Doppler, color Doppler, and power Doppler as needed of all accessible portions of each vessel. Bilateral testing is considered an integral part of a complete  examination. Limited examinations for reoccurring indications may be performed as noted. The reflux portion of the exam is performed with the patient in reverse Trendelenburg.  +---------+---------------+---------+-----------+----------+--------------+ RIGHT    CompressibilityPhasicitySpontaneityPropertiesThrombus Aging +---------+---------------+---------+-----------+----------+--------------+ CFV      Full           Yes      Yes                                 +---------+---------------+---------+-----------+----------+--------------+ SFJ      Full                                                        +---------+---------------+---------+-----------+----------+--------------+ FV Prox  Full                                                        +---------+---------------+---------+-----------+----------+--------------+ FV Mid   Full                                                        +---------+---------------+---------+-----------+----------+--------------+ FV DistalFull                                                        +---------+---------------+---------+-----------+----------+--------------+ PFV      Full                                                        +---------+---------------+---------+-----------+----------+--------------+ POP      Full           Yes      Yes                                 +---------+---------------+---------+-----------+----------+--------------+ PTV      Full                                                        +---------+---------------+---------+-----------+----------+--------------+  PERO     Full                                                        +---------+---------------+---------+-----------+----------+--------------+   +---------+---------------+---------+-----------+----------+--------------+ LEFT     CompressibilityPhasicitySpontaneityPropertiesThrombus Aging  +---------+---------------+---------+-----------+----------+--------------+ CFV      Full           Yes      Yes                                 +---------+---------------+---------+-----------+----------+--------------+ SFJ      Full                                                        +---------+---------------+---------+-----------+----------+--------------+ FV Prox  Full                                                        +---------+---------------+---------+-----------+----------+--------------+ FV Mid   Full                                                        +---------+---------------+---------+-----------+----------+--------------+ FV DistalFull                                                        +---------+---------------+---------+-----------+----------+--------------+ PFV      Full                                                        +---------+---------------+---------+-----------+----------+--------------+ POP      Full           Yes      Yes                                 +---------+---------------+---------+-----------+----------+--------------+ PTV      Full                                                        +---------+---------------+---------+-----------+----------+--------------+ PERO     Full                                                        +---------+---------------+---------+-----------+----------+--------------+  Summary: BILATERAL: - No evidence of deep vein thrombosis seen in the lower extremities, bilaterally.   *See table(s) above for measurements and observations. Electronically signed by Harold Barban MD on 05/03/2019 at 4:19:59 PM.    Final

## 2019-05-07 LAB — CBC WITH DIFFERENTIAL/PLATELET
Abs Immature Granulocytes: 0.5 10*3/uL — ABNORMAL HIGH (ref 0.00–0.07)
Basophils Absolute: 0 10*3/uL (ref 0.0–0.1)
Basophils Relative: 0 %
Eosinophils Absolute: 0 10*3/uL (ref 0.0–0.5)
Eosinophils Relative: 0 %
HCT: 36.6 % — ABNORMAL LOW (ref 39.0–52.0)
Hemoglobin: 11.5 g/dL — ABNORMAL LOW (ref 13.0–17.0)
Immature Granulocytes: 5 %
Lymphocytes Relative: 17 %
Lymphs Abs: 1.8 10*3/uL (ref 0.7–4.0)
MCH: 26.3 pg (ref 26.0–34.0)
MCHC: 31.4 g/dL (ref 30.0–36.0)
MCV: 83.8 fL (ref 80.0–100.0)
Monocytes Absolute: 0.8 10*3/uL (ref 0.1–1.0)
Monocytes Relative: 8 %
Neutro Abs: 7.5 10*3/uL (ref 1.7–7.7)
Neutrophils Relative %: 70 %
Platelets: 474 10*3/uL — ABNORMAL HIGH (ref 150–400)
RBC: 4.37 MIL/uL (ref 4.22–5.81)
RDW: 14.1 % (ref 11.5–15.5)
WBC: 10.7 10*3/uL — ABNORMAL HIGH (ref 4.0–10.5)
nRBC: 0 % (ref 0.0–0.2)

## 2019-05-07 LAB — COMPREHENSIVE METABOLIC PANEL
ALT: 37 U/L (ref 0–44)
AST: 35 U/L (ref 15–41)
Albumin: 2.7 g/dL — ABNORMAL LOW (ref 3.5–5.0)
Alkaline Phosphatase: 74 U/L (ref 38–126)
Anion gap: 9 (ref 5–15)
BUN: 37 mg/dL — ABNORMAL HIGH (ref 6–20)
CO2: 21 mmol/L — ABNORMAL LOW (ref 22–32)
Calcium: 8.7 mg/dL — ABNORMAL LOW (ref 8.9–10.3)
Chloride: 107 mmol/L (ref 98–111)
Creatinine, Ser: 1.71 mg/dL — ABNORMAL HIGH (ref 0.61–1.24)
GFR calc Af Amer: 52 mL/min — ABNORMAL LOW (ref >60)
GFR calc non Af Amer: 45 mL/min — ABNORMAL LOW (ref >60)
Glucose, Bld: 156 mg/dL — ABNORMAL HIGH (ref 70–99)
Potassium: 4.5 mmol/L (ref 3.5–5.1)
Sodium: 137 mmol/L (ref 135–145)
Total Bilirubin: 0.4 mg/dL (ref 0.3–1.2)
Total Protein: 6.1 g/dL — ABNORMAL LOW (ref 6.5–8.1)

## 2019-05-07 LAB — C-REACTIVE PROTEIN: CRP: 1.2 mg/dL — ABNORMAL HIGH (ref <1.0)

## 2019-05-07 LAB — GLUCOSE, CAPILLARY: Glucose-Capillary: 138 mg/dL — ABNORMAL HIGH (ref 70–99)

## 2019-05-07 MED ORDER — INSULIN ASPART 100 UNIT/ML FLEXPEN: 10.0000 [IU] | PEN_INJECTOR | Freq: Three times a day (TID) | SUBCUTANEOUS | 0 refills | Status: DC

## 2019-05-07 MED ORDER — ENSURE ENLIVE PO LIQD: 237.0000 mL | Freq: Two times a day (BID) | ORAL | 0 refills | Status: AC

## 2019-05-07 MED ORDER — BLOOD GLUCOSE METER KIT: PACK | 0 refills | Status: DC

## 2019-05-07 MED ORDER — AMLODIPINE BESYLATE 10 MG PO TABS: 10.0000 mg | ORAL_TABLET | Freq: Every day | ORAL | 0 refills | Status: DC

## 2019-05-07 MED ORDER — METFORMIN HCL 500 MG PO TABS: 500.0000 mg | ORAL_TABLET | Freq: Two times a day (BID) | ORAL | 0 refills | Status: DC

## 2019-05-07 MED ORDER — INSULIN GLARGINE 100 UNIT/ML SOLOSTAR PEN: PEN_INJECTOR | SUBCUTANEOUS | 0 refills | Status: DC

## 2019-05-07 MED ORDER — INSULIN DETEMIR 100 UNIT/ML FLEXPEN: PEN_INJECTOR | SUBCUTANEOUS | 0 refills | Status: DC

## 2019-05-07 MED ORDER — INSULIN PEN NEEDLE 32G X 8 MM MISC: 0 refills | Status: DC

## 2019-05-07 NOTE — TOC Transition Note (Signed)
Transition of Care [facility]) - CM/SW Discharge Note   Patient Details  Name: Patrick [name] MRN: [id number] Date of Birth: [birth year]  [facility] CM/SW Contact:  Maryclare Labrador, RN Phone Number: 05/07/2019, 11:11 AM   Clinical Narrative:   Pt deemed stable for discharge home today.  Pt confirms he has a PCP and denied barriers for paying for discharge meds.  CM checked with pt's pharmacy' both Levemir and Novolog are covered by pt's insurance with zero copay.  No other CM needs determined - CM signing off    Final next level of care: Home/Self Care Barriers to Discharge: Barriers Resolved   Patient Goals and CMS Choice        Discharge Placement                       Discharge Plan and Services                                     Social Determinants of Health (SDOH) Interventions     Readmission Risk Interventions No flowsheet data found.

## 2019-05-07 NOTE — Progress Notes (Signed)
Patient received discharge information and teaching. No new questions or concerns. Verbalized understanding. IV and tele removed. Wife to transport home.

## 2019-05-07 NOTE — Discharge Instructions (Addendum)
Person Under Monitoring Name: Patrick [name]  Location: [place] [address]   Infection Prevention Recommendations for Individuals Confirmed to have, or Being Evaluated for, 2019 Novel Coronavirus (COVID-19) Infection Who Receive Care at Home  Individuals who are confirmed to have, or are being evaluated for, COVID-19 should follow the prevention steps below until a healthcare provider or local or state health department says they can return to normal activities.  Stay home except to get medical care You should restrict activities outside your home, except for getting medical care. Do not go to work, school, or public areas, and do not use public transportation or taxis.  Call ahead before visiting your doctor Before your medical appointment, call the healthcare provider and tell them that you have, or are being evaluated for, COVID-19 infection. This will help the healthcare providers office take steps to keep other people from getting infected. Ask your healthcare provider to call the local or state health department.  Monitor your symptoms Seek prompt medical attention if your illness is worsening (e.g., difficulty breathing). Before going to your medical appointment, call the healthcare provider and tell them that you have, or are being evaluated for, COVID-19 infection. Ask your healthcare provider to call the local or state health department.  Wear a facemask You should wear a facemask that covers your nose and mouth when you are in the same room with other people and when you visit a healthcare provider. People who live with or visit you should also wear a facemask while they are in the same room with you.  Separate yourself from other people in your home As much as possible, you should stay in a different room from other people in your home. Also, you should use a separate bathroom, if available.  Avoid sharing household items You should not share dishes,  drinking glasses, cups, eating utensils, towels, bedding, or other items with other people in your home. After using these items, you should wash them thoroughly with soap and water.  Cover your coughs and sneezes Cover your mouth and nose with a tissue when you cough or sneeze, or you can cough or sneeze into your sleeve. Throw used tissues in a lined trash can, and immediately wash your hands with soap and water for at least 20 seconds or use an alcohol-based hand rub.  Wash your [facility] your hands often and thoroughly with soap and water for at least 20 seconds. You can use an alcohol-based hand sanitizer if soap and water are not available and if your hands are not visibly dirty. Avoid touching your eyes, nose, and mouth with unwashed hands.   Prevention Steps for Caregivers and Household Members of Individuals Confirmed to have, or Being Evaluated for, COVID-19 Infection Being Cared for in the Home  If you live with, or provide care at home for, a person confirmed to have, or being evaluated for, COVID-19 infection please follow these guidelines to prevent infection:  Follow healthcare providers instructions Make sure that you understand and can help the patient follow any healthcare provider instructions for all care.  Provide for the patients basic needs You should help the patient with basic needs in the home and provide support for getting groceries, prescriptions, and other personal needs.  Monitor the patients symptoms If they are getting sicker, call his or her medical provider and tell them that the patient has, or is being evaluated for, COVID-19 infection. This will help the healthcare providers office take  steps to keep other people from getting infected. Ask the healthcare provider to call the local or state health department.  Limit the number of people who have contact with the patient  If possible, have only one caregiver for the patient.  Other  household members should stay in another home or place of residence. If this is not possible, they should stay  in another room, or be separated from the patient as much as possible. Use a separate bathroom, if available.  Restrict visitors who do not have an essential need to be in the home.  Keep older adults, very young children, and other sick people away from the patient Keep older adults, very young children, and those who have compromised immune systems or chronic health conditions away from the patient. This includes people with chronic heart, lung, or kidney conditions, diabetes, and cancer.  Ensure good ventilation Make sure that shared spaces in the home have good air flow, such as from an air conditioner or an opened window, weather permitting.  Wash your hands often  Wash your hands often and thoroughly with soap and water for at least 20 seconds. You can use an alcohol based hand sanitizer if soap and water are not available and if your hands are not visibly dirty.  Avoid touching your eyes, nose, and mouth with unwashed hands.  Use disposable paper towels to dry your hands. If not available, use dedicated cloth towels and replace them when they become wet.  Wear a facemask and gloves  Wear a disposable facemask at all times in the room and gloves when you touch or have contact with the patients blood, body fluids, and/or secretions or excretions, such as sweat, saliva, sputum, nasal mucus, vomit, urine, or feces.  Ensure the mask fits over your nose and mouth tightly, and do not touch it during use.  Throw out disposable facemasks and gloves after using them. Do not reuse.  Wash your hands immediately after removing your facemask and gloves.  If your personal clothing becomes contaminated, carefully remove clothing and launder. Wash your hands after handling contaminated clothing.  Place all used disposable facemasks, gloves, and other waste in a lined container before  disposing them with other household waste.  Remove gloves and wash your hands immediately after handling these items.  Do not share dishes, glasses, or other household items with the patient  Avoid sharing household items. You should not share dishes, drinking glasses, cups, eating utensils, towels, bedding, or other items with a patient who is confirmed to have, or being evaluated for, COVID-19 infection.  After the person uses these items, you should wash them thoroughly with soap and water.  Wash laundry thoroughly  Immediately remove and wash clothes or bedding that have blood, body fluids, and/or secretions or excretions, such as sweat, saliva, sputum, nasal mucus, vomit, urine, or feces, on them.  Wear gloves when handling laundry from the patient.  Read and follow directions on labels of laundry or clothing items and detergent. In general, wash and dry with the warmest temperatures recommended on the label.  Clean all areas the individual has used often  Clean all touchable surfaces, such as counters, tabletops, doorknobs, bathroom fixtures, toilets, phones, keyboards, tablets, and bedside tables, every day. Also, clean any surfaces that may have blood, body fluids, and/or secretions or excretions on them.  Wear gloves when cleaning surfaces the patient has come in contact with.  Use a diluted bleach solution (e.g., dilute bleach with 1 part bleach  and 10 parts water) or a household disinfectant with a label that says EPA-registered for coronaviruses. To make a bleach solution at home, add 1 tablespoon of bleach to 1 quart (4 cups) of water. For a larger supply, add  cup of bleach to 1 gallon (16 cups) of water.  Read labels of cleaning products and follow recommendations provided on product labels. Labels contain instructions for safe and effective use of the cleaning product including precautions you should take when applying the product, such as wearing gloves or eye protection  and making sure you have good ventilation during use of the product.  Remove gloves and wash hands immediately after cleaning.  Monitor yourself for signs and symptoms of illness Caregivers and household members are considered close contacts, should monitor their health, and will be asked to limit movement outside of the home to the extent possible. Follow the monitoring steps for close contacts listed on the symptom monitoring form.   ? If you have additional questions, contact your local health department or call the epidemiologist on call at [phone] (available 24/7). ? This guidance is subject to change. For the most up-to-date guidance from [facility], please refer to their website: YouBlogs.pl

## 2019-05-07 NOTE — Discharge Summary (Addendum)
PATIENT DETAILS Name: Patrick [name] Age: 54 y.o. Sex: male Date of Birth: [birth year] MRN: [id number]. Admitting Physician: Vianne Bulls, MD PCP:Pa, [facility]  Admit Date: 05/03/2019 Discharge date: 05/07/2019  Recommendations for Outpatient Follow-up:  1. Follow up with PCP in 1-2 weeks 2. Please obtain CMP/CBC in one week 3. Repeat Chest Xray in 4-6 week 4. Please optimize insulin/glycemic regimen 5. Please consider outpatient referral to nephrology  Admitted From:  Home  Disposition: [place]: No  Equipment/Devices: None  Discharge Condition: Stable  CODE STATUS: FULL CODE  Diet recommendation:  Diet Order            Diet - low sodium heart healthy        Diet Carb Modified        Diet heart healthy/carb modified Room service appropriate? Yes; Fluid consistency: Thin  Diet effective now               Brief Narrative: Patient is a 54 y.o. male with PMHx of DM-2, HTN-who presented with fever, cough, shortness of breath-found to have acute hypoxic respiratory failure secondary to COVID-19 pneumonia.  Significant Events: 3/14>> admit to [facility]  Microbiology data: None  COVID-19 medication: Remdesivir: 3/13>> 3/17 Steroids: 3/13>>3/18  Antibiotics: Rocephin: 3/13>>3/18 Zithromax: 3/13>> 3/16  Procedures: None  Consults: None  Brief Hospital Course: Acute Hypoxic Resp Failure due to Covid 19 Viral pneumonia: Significantly better-has been on room air for the past several days.  He claims that he is now able to walk around in the room much better-compared to how he came into the hospital with.  CRP has essentially normalized.  Treated with steroids/remdesivir-and empiric Rocephin/Zithromax.  Since he is no longer hypoxic-clinically improved-I do not think he requires any further antimicrobial therapy.    COVID-19 Labs:  Recent Labs    05/05/19 0054 05/06/19 0243 05/07/19 0641  DDIMER 1.50* 0.88*  --   FERRITIN 1,214* 882*   --   CRP 4.5* 1.8* 1.2*    No results found for: SARSCOV2NAA   AKI on CKD stage IIIa: AKI hemodynamically mediated-improving with supportive care.  Renal ultrasound without any hydronephrosis.  Continues to have some amount of CKD stage III at baseline-suggest outpatient evaluation by nephrology at some point.  This will be deferred to PCP.  HTN: BP controlled-continue amlodipine  DM-2 with uncontrolled hyperglycemia secondary to steroids along with poor outpatient control (A1c 12.4 on 05/03/2019): CBGs stable-treated with Lantus and NovoLog insulin along with SSI.  Diabetic education has been provided.  Patient does acknowledge poor outpatient compliance with insulin.  On discharge-since he will no longer be on steroids-we will decrease a.m. Lantus to 60 units, and p.m. Lantus to 28 units.  Continue outpatient follow-up with PCP for further optimization.  Patient seems familiar with insulin administration-and is also familiar with hypoglycemic instructions.  Diabetic education has been provided by RN.  Addendum: Per case management-Lantus not covered by insurance-we will switch to Levemir.   Nutrition Problem: Nutrition Problem: Increased nutrient needs Etiology: acute illness(COVID) Signs/Symptoms: estimated needs Interventions: Ensure Enlive (each supplement provides 350kcal and 20 grams of protein)  Obesity: Estimated body mass index is 43.59 kg/m as calculated from the following:   Height as of this encounter: 6' (1.829 m).   Weight as of this encounter: 145.8 kg.    Procedures/Studies: None  Discharge Diagnoses:  Principal Problem:   Acute respiratory disease due to COVID-19 virus Active Problems:   Insulin-requiring or dependent type II diabetes mellitus (  [place])   HYPERTENSION, BENIGN ESSENTIAL   Renal insufficiency   Discharge Instructions:    Person Under Monitoring Name: Patrick [name]  Location: [address] [place] [address]   Infection Prevention  Recommendations for Individuals Confirmed to have, or Being Evaluated for, 2019 Novel Coronavirus (COVID-19) Infection Who Receive Care at Home  Individuals who are confirmed to have, or are being evaluated for, COVID-19 should follow the prevention steps below until a healthcare provider or local or state health department says they can return to normal activities.  Stay home except to get medical care You should restrict activities outside your home, except for getting medical care. Do not go to work, school, or public areas, and do not use public transportation or taxis.  Call ahead before visiting your doctor Before your medical appointment, call the healthcare provider and tell them that you have, or are being evaluated for, COVID-19 infection. This will help the healthcare provider's office take steps to keep other people from getting infected. Ask your healthcare provider to call the local or state health department.  Monitor your symptoms Seek prompt medical attention if your illness is worsening (e.g., difficulty breathing). Before going to your medical appointment, call the healthcare provider and tell them that you have, or are being evaluated for, COVID-19 infection. Ask your healthcare provider to call the local or state health department.  Wear a facemask You should wear a facemask that covers your nose and mouth when you are in the same room with other people and when you visit a healthcare provider. People who live with or visit you should also wear a facemask while they are in the same room with you.  Separate yourself from other people in your home As much as possible, you should stay in a different room from other people in your home. Also, you should use a separate bathroom, if available.  Avoid sharing household items You should not share dishes, drinking glasses, cups, eating utensils, towels, bedding, or other items with other people in your home. After using  these items, you should wash them thoroughly with soap and water.  Cover your coughs and sneezes Cover your mouth and nose with a tissue when you cough or sneeze, or you can cough or sneeze into your sleeve. Throw used tissues in a lined trash can, and immediately wash your hands with soap and water for at least 20 seconds or use an alcohol-based hand rub.  Wash your [facility] your hands often and thoroughly with soap and water for at least 20 seconds. You can use an alcohol-based hand sanitizer if soap and water are not available and if your hands are not visibly dirty. Avoid touching your eyes, nose, and mouth with unwashed hands.   Prevention Steps for Caregivers and Household Members of Individuals Confirmed to have, or Being Evaluated for, COVID-19 Infection Being Cared for in the Home  If you live with, or provide care at home for, a person confirmed to have, or being evaluated for, COVID-19 infection please follow these guidelines to prevent infection:  Follow healthcare provider's instructions Make sure that you understand and can help the patient follow any healthcare provider instructions for all care.  Provide for the patient's basic needs You should help the patient with basic needs in the home and provide support for getting groceries, prescriptions, and other personal needs.  Monitor the patient's symptoms If they are getting sicker, call his or her medical provider and tell them that the patient  has, or is being evaluated for, COVID-19 infection. This will help the healthcare provider's office take steps to keep other people from getting infected. Ask the healthcare provider to call the local or state health department.  Limit the number of people who have contact with the patient  If possible, have only one caregiver for the patient.  Other household members should stay in another home or place of residence. If this is not possible, they should stay  in another  room, or be separated from the patient as much as possible. Use a separate bathroom, if available.  Restrict visitors who do not have an essential need to be in the home.  Keep older adults, very young children, and other sick people away from the patient Keep older adults, very young children, and those who have compromised immune systems or chronic health conditions away from the patient. This includes people with chronic heart, lung, or kidney conditions, diabetes, and cancer.  Ensure good ventilation Make sure that shared spaces in the home have good air flow, such as from an air conditioner or an opened window, weather permitting.  Wash your hands often  Wash your hands often and thoroughly with soap and water for at least 20 seconds. You can use an alcohol based hand sanitizer if soap and water are not available and if your hands are not visibly dirty.  Avoid touching your eyes, nose, and mouth with unwashed hands.  Use disposable paper towels to dry your hands. If not available, use dedicated cloth towels and replace them when they become wet.  Wear a facemask and gloves  Wear a disposable facemask at all times in the room and gloves when you touch or have contact with the patient's blood, body fluids, and/or secretions or excretions, such as sweat, saliva, sputum, nasal mucus, vomit, urine, or feces.  Ensure the mask fits over your nose and mouth tightly, and do not touch it during use.  Throw out disposable facemasks and gloves after using them. Do not reuse.  Wash your hands immediately after removing your facemask and gloves.  If your personal clothing becomes contaminated, carefully remove clothing and launder. Wash your hands after handling contaminated clothing.  Place all used disposable facemasks, gloves, and other waste in a lined container before disposing them with other household waste.  Remove gloves and wash your hands immediately after handling these items.  Do  not share dishes, glasses, or other household items with the patient  Avoid sharing household items. You should not share dishes, drinking glasses, cups, eating utensils, towels, bedding, or other items with a patient who is confirmed to have, or being evaluated for, COVID-19 infection.  After the person uses these items, you should wash them thoroughly with soap and water.  Wash laundry thoroughly  Immediately remove and wash clothes or bedding that have blood, body fluids, and/or secretions or excretions, such as sweat, saliva, sputum, nasal mucus, vomit, urine, or feces, on them.  Wear gloves when handling laundry from the patient.  Read and follow directions on labels of laundry or clothing items and detergent. In general, wash and dry with the warmest temperatures recommended on the label.  Clean all areas the individual has used often  Clean all touchable surfaces, such as counters, tabletops, doorknobs, bathroom fixtures, toilets, phones, keyboards, tablets, and bedside tables, every day. Also, clean any surfaces that may have blood, body fluids, and/or secretions or excretions on them.  Wear gloves when cleaning surfaces the patient has come  in contact with.  Use a diluted bleach solution (e.g., dilute bleach with 1 part bleach and 10 parts water) or a household disinfectant with a label that says EPA-registered for coronaviruses. To make a bleach solution at home, add 1 tablespoon of bleach to 1 quart (4 cups) of water. For a larger supply, add  cup of bleach to 1 gallon (16 cups) of water.  Read labels of cleaning products and follow recommendations provided on product labels. Labels contain instructions for safe and effective use of the cleaning product including precautions you should take when applying the product, such as wearing gloves or eye protection and making sure you have good ventilation during use of the product.  Remove gloves and wash hands immediately after  cleaning.  Monitor yourself for signs and symptoms of illness Caregivers and household members are considered close contacts, should monitor their health, and will be asked to limit movement outside of the home to the extent possible. Follow the monitoring steps for close contacts listed on the symptom monitoring form.   ? If you have additional questions, contact your local health department or call the epidemiologist on call at [phone] (available 24/7). ? This guidance is subject to change. For the most up-to-date guidance from CDC, please refer to their website: YouBlogs.pl    Activity:  As tolerated with Full fall precautions use walker/cane & assistance as needed   Discharge Instructions    Call MD for:  difficulty breathing, headache or visual disturbances   Complete by: As directed    Call MD for:  persistant dizziness or light-headedness   Complete by: As directed    Call MD for:  persistant nausea and vomiting   Complete by: As directed    Call MD for:  severe uncontrolled pain   Complete by: As directed    Diet - low sodium heart healthy   Complete by: As directed    Diet Carb Modified   Complete by: As directed    Discharge instructions   Complete by: As directed    Follow with Primary MD  Pa, [place] in 1-2 weeks  Please get a complete blood count and chemistry panel checked by your Primary MD at your next visit, and again as instructed by your Primary MD.  Get Medicines reviewed and adjusted: Please take all your medications with you for your next visit with your Primary MD  Laboratory/radiological data: Please request your Primary MD to go over all hospital tests and procedure/radiological results at the follow up, please ask your Primary MD to get all Hospital records sent to his/her office.  In some cases, they will be blood work, cultures and biopsy results pending at the time of your  discharge. Please request that your primary care M.D. follows up on these results.  Also Note the following: If you experience worsening of your admission symptoms, develop shortness of breath, life threatening emergency, suicidal or homicidal thoughts you must seek medical attention immediately by calling 911 or calling your MD immediately  if symptoms less severe.  You must read complete instructions/literature along with all the possible adverse reactions/side effects for all the Medicines you take and that have been prescribed to you. Take any new Medicines after you have completely understood and accpet all the possible adverse reactions/side effects.   Do not drive when taking Pain medications or sleeping medications (Benzodaizepines)  Do not take more than prescribed Pain, Sleep and Anxiety Medications. It is not advisable to combine anxiety,sleep and  pain medications without talking with your primary care practitioner  Special Instructions: If you have smoked or chewed Tobacco  in the last 2 yrs please stop smoking, stop any regular Alcohol  and or any Recreational drug use.  Wear Seat belts while driving.  Please note: You were cared for by a hospitalist during your hospital stay. Once you are discharged, your primary care physician will handle any further medical issues. Please note that NO REFILLS for any discharge medications will be authorized once you are discharged, as it is imperative that you return to your primary care physician (or establish a relationship with a primary care physician if you do not have one) for your post hospital discharge needs so that they can reassess your need for medications and monitor your lab values.   1.  3 weeks of isolation from day of first positive Covid test-04/28/2019  2.  Please check your blood sugars 2-3 times a day, keep a recording of these readings and take it to the next appointment with your PCP   Increase activity slowly   Complete by:  As directed      Allergies as of 05/07/2019   No Known Allergies     Medication List    STOP taking these medications   furosemide 40 MG tablet Commonly known as: LASIX   gabapentin 300 MG capsule Commonly known as: NEURONTIN   ibuprofen 600 MG tablet Commonly known as: ADVIL   indomethacin 50 MG capsule Commonly known as: INDOCIN   losartan-hydrochlorothiazide 100-25 MG tablet Commonly known as: HYZAAR   methocarbamol 750 MG tablet Commonly known as: ROBAXIN   polyethylene glycol 17 g packet Commonly known as: MiraLax   Tresiba FlexTouch 200 UNIT/ML FlexTouch Pen Generic drug: insulin degludec     TAKE these medications   amLODipine 10 MG tablet Commonly known as: NORVASC Take 1 tablet (10 mg total) by mouth daily. Start taking on: May 08, 2019   blood glucose meter kit and supplies Dispense based on patient and insurance preference. Use up to four times daily as directed. (FOR ICD-10 E10.9, E11.9).   feeding supplement (ENSURE ENLIVE) Liqd Take 237 mLs by mouth 2 (two) times daily between meals.   insulin aspart 100 UNIT/ML FlexPen Commonly known as: NOVOLOG Inject 10 Units into the skin 3 (three) times daily with meals.   insulin detemir 100 UNIT/ML FlexPen Commonly known as: LEVEMIR 60 units subcutaneously in a.m., and 28 units in p.m.   Insulin Pen Needle 32G X 8 MM Misc Use as directed   metFORMIN 500 MG tablet Commonly known as: GLUCOPHAGE Take 1 tablet (500 mg total) by mouth 2 (two) times daily with a meal. What changed: when to take this   tiZANidine 4 MG tablet Commonly known as: ZANAFLEX Take 4 mg by mouth 2 (two) times daily as needed for muscle spasms.      Follow-up Information    Pa, [facility] Follow up in 2 week(s).   Specialty: Internal Medicine Why: Please inform them of your COVID-19 positive status. Contact information: [address] [place] [address] [phone]          No Known  Allergies   Other Procedures/Studies: US RENAL  Result Date: 05/03/2019 CLINICAL DATA:  Acute kidney injury. EXAM: RENAL / URINARY TRACT ULTRASOUND COMPLETE COMPARISON:  None. FINDINGS: Right Kidney: Renal measurements: 11.0 x 5.1 x 5.6 cm = volume: 163.7 mL . Echogenicity within normal limits. No mass or hydronephrosis visualized. Left Kidney: Renal measurements: 10.1  x 5.8 x 5.3 cm = volume: 159.4 mL. Echogenicity within normal limits. No mass or hydronephrosis visualized. Bladder: Appears normal for degree of bladder distention. Other: Numerous gallstones. IMPRESSION: 1. Normal sonographic appearance of the kidneys. 2. Incidental note made of multiple gallstones. Electronically Signed   By: Lajean Manes M.D.   On: 05/03/2019 16:14   VAS [place] LOWER EXTREMITY VENOUS (DVT)  Result Date: 05/03/2019  Lower Venous DVTStudy Indications: Elevated ddimer, tachycardia, and Swelling.  Comparison Study: no prior Performing Technologist: Abram Sander RVS  Examination Guidelines: A complete evaluation includes B-mode imaging, spectral Doppler, color Doppler, and power Doppler as needed of all accessible portions of each vessel. Bilateral testing is considered an integral part of a complete examination. Limited examinations for reoccurring indications may be performed as noted. The reflux portion of the exam is performed with the patient in reverse Trendelenburg.  +---------+---------------+---------+-----------+----------+--------------+ RIGHT    CompressibilityPhasicitySpontaneityPropertiesThrombus Aging +---------+---------------+---------+-----------+----------+--------------+ CFV      Full           Yes      Yes                                 +---------+---------------+---------+-----------+----------+--------------+ SFJ      Full                                                        +---------+---------------+---------+-----------+----------+--------------+ FV Prox  Full                                                         +---------+---------------+---------+-----------+----------+--------------+ FV Mid   Full                                                        +---------+---------------+---------+-----------+----------+--------------+ FV DistalFull                                                        +---------+---------------+---------+-----------+----------+--------------+ PFV      Full                                                        +---------+---------------+---------+-----------+----------+--------------+ POP      Full           Yes      Yes                                 +---------+---------------+---------+-----------+----------+--------------+ PTV      Full                                                        +---------+---------------+---------+-----------+----------+--------------+  PERO     Full                                                        +---------+---------------+---------+-----------+----------+--------------+   +---------+---------------+---------+-----------+----------+--------------+ LEFT     CompressibilityPhasicitySpontaneityPropertiesThrombus Aging +---------+---------------+---------+-----------+----------+--------------+ CFV      Full           Yes      Yes                                 +---------+---------------+---------+-----------+----------+--------------+ SFJ      Full                                                        +---------+---------------+---------+-----------+----------+--------------+ FV Prox  Full                                                        +---------+---------------+---------+-----------+----------+--------------+ FV Mid   Full                                                        +---------+---------------+---------+-----------+----------+--------------+ FV DistalFull                                                         +---------+---------------+---------+-----------+----------+--------------+ PFV      Full                                                        +---------+---------------+---------+-----------+----------+--------------+ POP      Full           Yes      Yes                                 +---------+---------------+---------+-----------+----------+--------------+ PTV      Full                                                        +---------+---------------+---------+-----------+----------+--------------+ PERO     Full                                                        +---------+---------------+---------+-----------+----------+--------------+  Summary: BILATERAL: - No evidence of deep vein thrombosis seen in the lower extremities, bilaterally.   *See table(s) above for measurements and observations. Electronically signed by Harold Barban MD on 05/03/2019 at 4:19:59 PM.    Final      TODAY-DAY OF DISCHARGE:  Subjective:   Patrick [name] today has no headache,no chest abdominal pain,no new weakness tingling or numbness, feels much better wants to go home today.   Objective:   Blood pressure 112/72, pulse (!) 106, temperature 98 F (36.7 C), temperature source Oral, resp. rate 18, height 6' (1.829 m), weight (!) 145.8 kg, SpO2 98 %.  Intake/Output Summary (Last 24 hours) at 05/07/2019 1045 Last data filed at 05/07/2019 0600 Gross per 24 hour  Intake 100 ml  Output --  Net 100 ml   Filed Weights   05/03/19 0100  Weight: (!) 145.8 kg    Exam: Awake Alert, Oriented *3, No new F.N deficits, Normal affect [place].AT,PERRAL Supple Neck,No JVD, No cervical lymphadenopathy appriciated.  Symmetrical Chest wall movement, Good air movement bilaterally, CTAB RRR,No Gallops,Rubs or new Murmurs, No Parasternal Heave +ve B.Sounds, Abd Soft, Non tender, No organomegaly appriciated, No rebound -guarding or rigidity. No Cyanosis, Clubbing or edema, No new Rash or  bruise   PERTINENT RADIOLOGIC STUDIES: US RENAL  Result Date: 05/03/2019 CLINICAL DATA:  Acute kidney injury. EXAM: RENAL / URINARY TRACT ULTRASOUND COMPLETE COMPARISON:  None. FINDINGS: Right Kidney: Renal measurements: 11.0 x 5.1 x 5.6 cm = volume: 163.7 mL . Echogenicity within normal limits. No mass or hydronephrosis visualized. Left Kidney: Renal measurements: 10.1 x 5.8 x 5.3 cm = volume: 159.4 mL. Echogenicity within normal limits. No mass or hydronephrosis visualized. Bladder: Appears normal for degree of bladder distention. Other: Numerous gallstones. IMPRESSION: 1. Normal sonographic appearance of the kidneys. 2. Incidental note made of multiple gallstones. Electronically Signed   By: Lajean Manes M.D.   On: 05/03/2019 16:14   VAS [place] LOWER EXTREMITY VENOUS (DVT)  Result Date: 05/03/2019  Lower Venous DVTStudy Indications: Elevated ddimer, tachycardia, and Swelling.  Comparison Study: no prior Performing Technologist: Abram Sander RVS  Examination Guidelines: A complete evaluation includes B-mode imaging, spectral Doppler, color Doppler, and power Doppler as needed of all accessible portions of each vessel. Bilateral testing is considered an integral part of a complete examination. Limited examinations for reoccurring indications may be performed as noted. The reflux portion of the exam is performed with the patient in reverse Trendelenburg.  +---------+---------------+---------+-----------+----------+--------------+ RIGHT    CompressibilityPhasicitySpontaneityPropertiesThrombus Aging +---------+---------------+---------+-----------+----------+--------------+ CFV      Full           Yes      Yes                                 +---------+---------------+---------+-----------+----------+--------------+ SFJ      Full                                                        +---------+---------------+---------+-----------+----------+--------------+ FV Prox  Full                                                         +---------+---------------+---------+-----------+----------+--------------+  FV Mid   Full                                                        +---------+---------------+---------+-----------+----------+--------------+ FV DistalFull                                                        +---------+---------------+---------+-----------+----------+--------------+ PFV      Full                                                        +---------+---------------+---------+-----------+----------+--------------+ POP      Full           Yes      Yes                                 +---------+---------------+---------+-----------+----------+--------------+ PTV      Full                                                        +---------+---------------+---------+-----------+----------+--------------+ PERO     Full                                                        +---------+---------------+---------+-----------+----------+--------------+   +---------+---------------+---------+-----------+----------+--------------+ LEFT     CompressibilityPhasicitySpontaneityPropertiesThrombus Aging +---------+---------------+---------+-----------+----------+--------------+ CFV      Full           Yes      Yes                                 +---------+---------------+---------+-----------+----------+--------------+ SFJ      Full                                                        +---------+---------------+---------+-----------+----------+--------------+ FV Prox  Full                                                        +---------+---------------+---------+-----------+----------+--------------+ FV Mid   Full                                                        +---------+---------------+---------+-----------+----------+--------------+  FV DistalFull                                                         +---------+---------------+---------+-----------+----------+--------------+ PFV      Full                                                        +---------+---------------+---------+-----------+----------+--------------+ POP      Full           Yes      Yes                                 +---------+---------------+---------+-----------+----------+--------------+ PTV      Full                                                        +---------+---------------+---------+-----------+----------+--------------+ PERO     Full                                                        +---------+---------------+---------+-----------+----------+--------------+     Summary: BILATERAL: - No evidence of deep vein thrombosis seen in the lower extremities, bilaterally.   *See table(s) above for measurements and observations. Electronically signed by Harold Barban MD on 05/03/2019 at 4:19:59 PM.    Final      PERTINENT LAB RESULTS: CBC: Recent Labs    05/06/19 0243 05/07/19 0641  WBC 10.0 10.7*  HGB 11.6* 11.5*  HCT 36.9* 36.6*  PLT 444* 474*   CMET CMP     Component Value Date/Time   NA 137 05/07/2019 0641   K 4.5 05/07/2019 0641   CL 107 05/07/2019 0641   CO2 21 (L) 05/07/2019 0641   GLUCOSE 156 (H) 05/07/2019 0641   BUN 37 (H) 05/07/2019 0641   CREATININE 1.71 (H) 05/07/2019 0641   CALCIUM 8.7 (L) 05/07/2019 0641   PROT 6.1 (L) 05/07/2019 0641   ALBUMIN 2.7 (L) 05/07/2019 0641   AST 35 05/07/2019 0641   ALT 37 05/07/2019 0641   ALKPHOS 74 05/07/2019 0641   BILITOT 0.4 05/07/2019 0641   GFRNONAA 45 (L) 05/07/2019 0641   GFRAA 52 (L) 05/07/2019 0641    GFR Estimated Creatinine Clearance: 75 mL/min (A) (by C-G formula based on SCr of 1.71 mg/dL (H)). No results for input(s): LIPASE, AMYLASE in the last 72 hours. No results for input(s): CKTOTAL, CKMB, CKMBINDEX, TROPONINI in the last 72 hours. Invalid input(s): POCBNP Recent Labs    05/05/19 0054 05/06/19 0243    DDIMER 1.50* 0.88*   No results for input(s): HGBA1C in the last 72 hours. No results for input(s): CHOL, HDL, LDLCALC, TRIG, CHOLHDL, LDLDIRECT in the last 72 hours. No results for input(s): TSH, T4TOTAL, T3FREE, THYROIDAB in the last 72 hours.  Invalid input(s): FREET3 Recent Labs    05/05/19 0054 05/06/19 0243  FERRITIN 1,214* 882*   Coags: No results for input(s): INR in the last 72 hours.  Invalid input(s): PT Microbiology: No results found for this or any previous visit (from the past 240 hour(s)).  FURTHER DISCHARGE INSTRUCTIONS:  Get Medicines reviewed and adjusted: Please take all your medications with you for your next visit with your Primary MD  Laboratory/radiological data: Please request your Primary MD to go over all hospital tests and procedure/radiological results at the follow up, please ask your Primary MD to get all Hospital records sent to his/her office.  In some cases, they will be blood work, cultures and biopsy results pending at the time of your discharge. Please request that your primary care M.D. goes through all the records of your hospital data and follows up on these results.  Also Note the following: If you experience worsening of your admission symptoms, develop shortness of breath, life threatening emergency, suicidal or homicidal thoughts you must seek medical attention immediately by calling 911 or calling your MD immediately  if symptoms less severe.  You must read complete instructions/literature along with all the possible adverse reactions/side effects for all the Medicines you take and that have been prescribed to you. Take any new Medicines after you have completely understood and accpet all the possible adverse reactions/side effects.   Do not drive when taking Pain medications or sleeping medications (Benzodaizepines)  Do not take more than prescribed Pain, Sleep and Anxiety Medications. It is not advisable to combine anxiety,sleep and  pain medications without talking with your primary care practitioner  Special Instructions: If you have smoked or chewed Tobacco  in the last 2 yrs please stop smoking, stop any regular Alcohol  and or any Recreational drug use.  Wear Seat belts while driving.  Please note: You were cared for by a hospitalist during your hospital stay. Once you are discharged, your primary care physician will handle any further medical issues. Please note that NO REFILLS for any discharge medications will be authorized once you are discharged, as it is imperative that you return to your primary care physician (or establish a relationship with a primary care physician if you do not have one) for your post hospital discharge needs so that they can reassess your need for medications and monitor your lab values.  Total Time spent coordinating discharge including counseling, education and face to face time equals 35 minutes.  SignedOren Binet 05/07/2019 10:45 AM

## 2019-06-22 DIAGNOSIS — U071 COVID-19: Secondary | ICD-10-CM | POA: Diagnosis not present

## 2019-06-22 DIAGNOSIS — N183 Chronic kidney disease, stage 3 unspecified: Secondary | ICD-10-CM | POA: Diagnosis not present

## 2019-06-22 DIAGNOSIS — I10 Essential (primary) hypertension: Secondary | ICD-10-CM | POA: Diagnosis not present

## 2019-06-22 DIAGNOSIS — G479 Sleep disorder, unspecified: Secondary | ICD-10-CM | POA: Diagnosis not present

## 2019-06-22 DIAGNOSIS — E1140 Type 2 diabetes mellitus with diabetic neuropathy, unspecified: Secondary | ICD-10-CM | POA: Diagnosis not present

## 2019-07-27 DIAGNOSIS — I10 Essential (primary) hypertension: Secondary | ICD-10-CM | POA: Diagnosis not present

## 2019-07-27 DIAGNOSIS — E7849 Other hyperlipidemia: Secondary | ICD-10-CM | POA: Diagnosis not present

## 2019-07-27 DIAGNOSIS — E1140 Type 2 diabetes mellitus with diabetic neuropathy, unspecified: Secondary | ICD-10-CM | POA: Diagnosis not present

## 2019-09-23 DIAGNOSIS — M1000 Idiopathic gout, unspecified site: Secondary | ICD-10-CM | POA: Diagnosis not present

## 2019-09-23 DIAGNOSIS — E1140 Type 2 diabetes mellitus with diabetic neuropathy, unspecified: Secondary | ICD-10-CM | POA: Diagnosis not present

## 2019-09-23 DIAGNOSIS — I872 Venous insufficiency (chronic) (peripheral): Secondary | ICD-10-CM | POA: Diagnosis not present

## 2019-09-23 DIAGNOSIS — I10 Essential (primary) hypertension: Secondary | ICD-10-CM | POA: Diagnosis not present

## 2020-06-04 ENCOUNTER — Emergency Department (HOSPITAL_COMMUNITY): Payer: 59

## 2020-06-04 ENCOUNTER — Other Ambulatory Visit: Payer: Self-pay

## 2020-06-04 ENCOUNTER — Inpatient Hospital Stay (HOSPITAL_COMMUNITY)
Admission: EM | Admit: 2020-06-04 | Discharge: 2020-06-10 | DRG: 853 | Disposition: A | Discharge status: Home Health | Payer: 59 | Attending: Family Medicine | Admitting: Family Medicine

## 2020-06-04 ENCOUNTER — Encounter (HOSPITAL_COMMUNITY): Payer: Self-pay | Admitting: Emergency Medicine

## 2020-06-04 DIAGNOSIS — L97519 Non-pressure chronic ulcer of other part of right foot with unspecified severity: Secondary | ICD-10-CM | POA: Diagnosis present

## 2020-06-04 DIAGNOSIS — N179 Acute kidney failure, unspecified: Secondary | ICD-10-CM | POA: Diagnosis present

## 2020-06-04 DIAGNOSIS — N289 Disorder of kidney and ureter, unspecified: Secondary | ICD-10-CM

## 2020-06-04 DIAGNOSIS — N184 Chronic kidney disease, stage 4 (severe): Secondary | ICD-10-CM | POA: Diagnosis present

## 2020-06-04 DIAGNOSIS — M869 Osteomyelitis, unspecified: Secondary | ICD-10-CM | POA: Diagnosis not present

## 2020-06-04 DIAGNOSIS — L089 Local infection of the skin and subcutaneous tissue, unspecified: Secondary | ICD-10-CM | POA: Diagnosis not present

## 2020-06-04 DIAGNOSIS — Z79899 Other long term (current) drug therapy: Secondary | ICD-10-CM | POA: Diagnosis not present

## 2020-06-04 DIAGNOSIS — L97509 Non-pressure chronic ulcer of other part of unspecified foot with unspecified severity: Secondary | ICD-10-CM | POA: Diagnosis present

## 2020-06-04 DIAGNOSIS — E1165 Type 2 diabetes mellitus with hyperglycemia: Secondary | ICD-10-CM | POA: Diagnosis present

## 2020-06-04 DIAGNOSIS — A4181 Sepsis due to Enterococcus: Principal | ICD-10-CM | POA: Diagnosis present

## 2020-06-04 DIAGNOSIS — I10 Essential (primary) hypertension: Secondary | ICD-10-CM | POA: Diagnosis not present

## 2020-06-04 DIAGNOSIS — Z8616 Personal history of COVID-19: Secondary | ICD-10-CM | POA: Diagnosis not present

## 2020-06-04 DIAGNOSIS — I96 Gangrene, not elsewhere classified: Secondary | ICD-10-CM | POA: Diagnosis not present

## 2020-06-04 DIAGNOSIS — E11628 Type 2 diabetes mellitus with other skin complications: Secondary | ICD-10-CM | POA: Diagnosis present

## 2020-06-04 DIAGNOSIS — E11621 Type 2 diabetes mellitus with foot ulcer: Secondary | ICD-10-CM

## 2020-06-04 DIAGNOSIS — N183 Chronic kidney disease, stage 3 unspecified: Secondary | ICD-10-CM | POA: Diagnosis present

## 2020-06-04 DIAGNOSIS — R6520 Severe sepsis without septic shock: Secondary | ICD-10-CM

## 2020-06-04 DIAGNOSIS — Z794 Long term (current) use of insulin: Secondary | ICD-10-CM | POA: Diagnosis not present

## 2020-06-04 DIAGNOSIS — E1122 Type 2 diabetes mellitus with diabetic chronic kidney disease: Secondary | ICD-10-CM | POA: Diagnosis present

## 2020-06-04 DIAGNOSIS — M109 Gout, unspecified: Secondary | ICD-10-CM | POA: Diagnosis present

## 2020-06-04 DIAGNOSIS — L02611 Cutaneous abscess of right foot: Secondary | ICD-10-CM | POA: Diagnosis present

## 2020-06-04 DIAGNOSIS — I129 Hypertensive chronic kidney disease with stage 1 through stage 4 chronic kidney disease, or unspecified chronic kidney disease: Secondary | ICD-10-CM | POA: Diagnosis present

## 2020-06-04 DIAGNOSIS — E1152 Type 2 diabetes mellitus with diabetic peripheral angiopathy with gangrene: Secondary | ICD-10-CM | POA: Diagnosis present

## 2020-06-04 DIAGNOSIS — E43 Unspecified severe protein-calorie malnutrition: Secondary | ICD-10-CM | POA: Diagnosis present

## 2020-06-04 DIAGNOSIS — Z20822 Contact with and (suspected) exposure to covid-19: Secondary | ICD-10-CM | POA: Diagnosis present

## 2020-06-04 DIAGNOSIS — E1169 Type 2 diabetes mellitus with other specified complication: Secondary | ICD-10-CM | POA: Diagnosis present

## 2020-06-04 DIAGNOSIS — Z7984 Long term (current) use of oral hypoglycemic drugs: Secondary | ICD-10-CM | POA: Diagnosis not present

## 2020-06-04 DIAGNOSIS — A419 Sepsis, unspecified organism: Secondary | ICD-10-CM | POA: Diagnosis not present

## 2020-06-04 DIAGNOSIS — Z6841 Body Mass Index (BMI) 40.0 and over, adult: Secondary | ICD-10-CM

## 2020-06-04 DIAGNOSIS — A480 Gas gangrene: Secondary | ICD-10-CM | POA: Diagnosis present

## 2020-06-04 DIAGNOSIS — M868X7 Other osteomyelitis, ankle and foot: Secondary | ICD-10-CM | POA: Diagnosis present

## 2020-06-04 DIAGNOSIS — L03115 Cellulitis of right lower limb: Secondary | ICD-10-CM | POA: Diagnosis present

## 2020-06-04 DIAGNOSIS — E1140 Type 2 diabetes mellitus with diabetic neuropathy, unspecified: Secondary | ICD-10-CM | POA: Diagnosis present

## 2020-06-04 DIAGNOSIS — E669 Obesity, unspecified: Secondary | ICD-10-CM | POA: Diagnosis present

## 2020-06-04 DIAGNOSIS — L0390 Cellulitis, unspecified: Secondary | ICD-10-CM | POA: Diagnosis present

## 2020-06-04 DIAGNOSIS — E119 Type 2 diabetes mellitus without complications: Secondary | ICD-10-CM | POA: Diagnosis not present

## 2020-06-04 HISTORY — DX: Type 2 diabetes mellitus with foot ulcer: E11.621

## 2020-06-04 HISTORY — DX: Gas gangrene: A48.0

## 2020-06-04 HISTORY — DX: Local infection of the skin and subcutaneous tissue, unspecified: E11.628

## 2020-06-04 HISTORY — DX: Sepsis, unspecified organism: A41.9

## 2020-06-04 HISTORY — DX: Type 2 diabetes mellitus with other skin complications: L08.9

## 2020-06-04 LAB — CBC WITH DIFFERENTIAL/PLATELET
Abs Immature Granulocytes: 0.2 10*3/uL — ABNORMAL HIGH (ref 0.00–0.07)
Basophils Absolute: 0.1 10*3/uL (ref 0.0–0.1)
Basophils Relative: 0 %
Eosinophils Absolute: 0 10*3/uL (ref 0.0–0.5)
Eosinophils Relative: 0 %
HCT: 30.5 % — ABNORMAL LOW (ref 39.0–52.0)
Hemoglobin: 9.5 g/dL — ABNORMAL LOW (ref 13.0–17.0)
Immature Granulocytes: 1 %
Lymphocytes Relative: 7 %
Lymphs Abs: 1.4 10*3/uL (ref 0.7–4.0)
MCH: 26.8 pg (ref 26.0–34.0)
MCHC: 31.1 g/dL (ref 30.0–36.0)
MCV: 86.2 fL (ref 80.0–100.0)
Monocytes Absolute: 1.2 10*3/uL — ABNORMAL HIGH (ref 0.1–1.0)
Monocytes Relative: 6 %
Neutro Abs: 16.6 10*3/uL — ABNORMAL HIGH (ref 1.7–7.7)
Neutrophils Relative %: 86 %
Platelets: 439 10*3/uL — ABNORMAL HIGH (ref 150–400)
RBC: 3.54 MIL/uL — ABNORMAL LOW (ref 4.22–5.81)
RDW: 13.2 % (ref 11.5–15.5)
WBC: 19.4 10*3/uL — ABNORMAL HIGH (ref 4.0–10.5)
nRBC: 0 % (ref 0.0–0.2)

## 2020-06-04 LAB — GLUCOSE, CAPILLARY
Glucose-Capillary: 300 mg/dL — ABNORMAL HIGH (ref 70–99)
Glucose-Capillary: 307 mg/dL — ABNORMAL HIGH (ref 70–99)

## 2020-06-04 LAB — COMPREHENSIVE METABOLIC PANEL
ALT: 19 U/L (ref 0–44)
AST: 25 U/L (ref 15–41)
Albumin: 2.7 g/dL — ABNORMAL LOW (ref 3.5–5.0)
Alkaline Phosphatase: 123 U/L (ref 38–126)
Anion gap: 11 (ref 5–15)
BUN: 28 mg/dL — ABNORMAL HIGH (ref 6–20)
CO2: 20 mmol/L — ABNORMAL LOW (ref 22–32)
Calcium: 8.6 mg/dL — ABNORMAL LOW (ref 8.9–10.3)
Chloride: 98 mmol/L (ref 98–111)
Creatinine, Ser: 2.14 mg/dL — ABNORMAL HIGH (ref 0.61–1.24)
GFR, Estimated: 36 mL/min — ABNORMAL LOW (ref >60)
Glucose, Bld: 393 mg/dL — ABNORMAL HIGH (ref 70–99)
Potassium: 4.4 mmol/L (ref 3.5–5.1)
Sodium: 129 mmol/L — ABNORMAL LOW (ref 135–145)
Total Bilirubin: 0.9 mg/dL (ref 0.3–1.2)
Total Protein: 7.1 g/dL (ref 6.5–8.1)

## 2020-06-04 LAB — C-REACTIVE PROTEIN: CRP: 24.4 mg/dL — ABNORMAL HIGH (ref <1.0)

## 2020-06-04 LAB — APTT: aPTT: 31 seconds (ref 24–36)

## 2020-06-04 LAB — LACTIC ACID, PLASMA: Lactic Acid, Venous: 1.5 mmol/L (ref 0.5–1.9)

## 2020-06-04 LAB — PREALBUMIN: Prealbumin: 5.1 mg/dL — ABNORMAL LOW (ref 18–38)

## 2020-06-04 LAB — SARS CORONAVIRUS 2 (TAT 6-24 HRS): SARS Coronavirus 2: NEGATIVE

## 2020-06-04 LAB — PROTIME-INR
INR: 1.2 (ref 0.8–1.2)
Prothrombin Time: 15.5 seconds — ABNORMAL HIGH (ref 11.4–15.2)

## 2020-06-04 LAB — SEDIMENTATION RATE: Sed Rate: 105 mm/hr — ABNORMAL HIGH (ref 0–16)

## 2020-06-04 LAB — HIV ANTIBODY (ROUTINE TESTING W REFLEX): HIV Screen 4th Generation wRfx: NONREACTIVE

## 2020-06-04 MED ORDER — LACTATED RINGERS IV BOLUS (SEPSIS)
500.0000 mL | Freq: Once | INTRAVENOUS | Status: AC
  Administered 2020-06-04: 500 mL via INTRAVENOUS

## 2020-06-04 MED ORDER — SODIUM CHLORIDE 0.9 % IV SOLN: 2.0000 g | Freq: Two times a day (BID) | INTRAVENOUS | Status: DC

## 2020-06-04 MED ORDER — METRONIDAZOLE IN NACL 5-0.79 MG/ML-% IV SOLN
500.0000 mg | Freq: Once | INTRAVENOUS | Status: DC
  Administered 2020-06-04: 500 mg via INTRAVENOUS
  Filled 2020-06-04: qty 100

## 2020-06-04 MED ORDER — LACTATED RINGERS IV BOLUS (SEPSIS)
1000.0000 mL | Freq: Once | INTRAVENOUS | Status: AC
  Administered 2020-06-04: 1000 mL via INTRAVENOUS

## 2020-06-04 MED ORDER — ACETAMINOPHEN 325 MG PO TABS
650.0000 mg | ORAL_TABLET | Freq: Once | ORAL | Status: AC | PRN
  Administered 2020-06-04: 650 mg via ORAL
  Filled 2020-06-04: qty 2

## 2020-06-04 MED ORDER — ACETAMINOPHEN 650 MG RE SUPP: 650.0000 mg | Freq: Four times a day (QID) | RECTAL | Status: DC | PRN

## 2020-06-04 MED ORDER — LIP MEDEX EX OINT
1.0000 "application " | TOPICAL_OINTMENT | CUTANEOUS | Status: DC | PRN
  Filled 2020-06-04: qty 7

## 2020-06-04 MED ORDER — CLINDAMYCIN PHOSPHATE 600 MG/50ML IV SOLN
600.0000 mg | Freq: Three times a day (TID) | INTRAVENOUS | Status: DC
  Administered 2020-06-04 – 2020-06-07 (×8): 600 mg via INTRAVENOUS
  Filled 2020-06-04 (×10): qty 50

## 2020-06-04 MED ORDER — LACTATED RINGERS IV SOLN: INTRAVENOUS | Status: AC

## 2020-06-04 MED ORDER — INSULIN ASPART 100 UNIT/ML ~~LOC~~ SOLN: 10.0000 [IU] | Freq: Once | SUBCUTANEOUS | Status: DC

## 2020-06-04 MED ORDER — INSULIN DETEMIR 100 UNIT/ML ~~LOC~~ SOLN
20.0000 [IU] | Freq: Two times a day (BID) | SUBCUTANEOUS | Status: DC
  Administered 2020-06-05: 20 [IU] via SUBCUTANEOUS
  Filled 2020-06-04 (×3): qty 0.2

## 2020-06-04 MED ORDER — VANCOMYCIN HCL 1000 MG/200ML IV SOLN: 1000.0000 mg | Freq: Once | INTRAVENOUS | Status: DC

## 2020-06-04 MED ORDER — VANCOMYCIN HCL 1500 MG/300ML IV SOLN
1500.0000 mg | INTRAVENOUS | Status: DC
  Administered 2020-06-05 – 2020-06-06 (×2): 1500 mg via INTRAVENOUS
  Filled 2020-06-04 (×2): qty 300

## 2020-06-04 MED ORDER — VANCOMYCIN HCL 2000 MG/400ML IV SOLN
2000.0000 mg | Freq: Once | INTRAVENOUS | Status: AC
  Administered 2020-06-04: 2000 mg via INTRAVENOUS
  Filled 2020-06-04: qty 400

## 2020-06-04 MED ORDER — INSULIN ASPART 100 UNIT/ML ~~LOC~~ SOLN
0.0000 [IU] | SUBCUTANEOUS | Status: DC
  Administered 2020-06-04: 11 [IU] via SUBCUTANEOUS
  Administered 2020-06-05 (×2): 5 [IU] via SUBCUTANEOUS

## 2020-06-04 MED ORDER — ACETAMINOPHEN 325 MG PO TABS
650.0000 mg | ORAL_TABLET | Freq: Four times a day (QID) | ORAL | Status: DC | PRN
  Administered 2020-06-05: 650 mg via ORAL
  Filled 2020-06-04: qty 2

## 2020-06-04 MED ORDER — GUAIFENESIN-DM 100-10 MG/5ML PO SYRP
5.0000 mL | ORAL_SOLUTION | ORAL | Status: DC | PRN
  Administered 2020-06-04 – 2020-06-08 (×5): 5 mL via ORAL
  Filled 2020-06-04 (×5): qty 5

## 2020-06-04 MED ORDER — ONDANSETRON HCL 4 MG/2ML IJ SOLN: 4.0000 mg | Freq: Four times a day (QID) | INTRAMUSCULAR | Status: DC | PRN

## 2020-06-04 MED ORDER — ONDANSETRON HCL 4 MG PO TABS: 4.0000 mg | ORAL_TABLET | Freq: Four times a day (QID) | ORAL | Status: DC | PRN

## 2020-06-04 MED ORDER — SODIUM CHLORIDE 0.9 % IV SOLN
2.0000 g | Freq: Once | INTRAVENOUS | Status: AC
  Administered 2020-06-04: 2 g via INTRAVENOUS
  Filled 2020-06-04: qty 2

## 2020-06-04 MED ORDER — PIPERACILLIN-TAZOBACTAM 3.375 G IVPB
3.3750 g | Freq: Three times a day (TID) | INTRAVENOUS | Status: DC
  Administered 2020-06-05 – 2020-06-07 (×8): 3.375 g via INTRAVENOUS
  Filled 2020-06-04 (×7): qty 50

## 2020-06-04 NOTE — Progress Notes (Signed)
Pharmacy Antibiotic Note  Patrick [name] is a 55 y.o. male admitted on 06/04/2020 with R- foot wound with gas, possible necrotizing fascitis.  Pharmacy has been consulted for piperacillin/tazobactam and vancomycin dosing. Patient is also on clindamycin per MD.  Per MD, stop cefepime and start pip/tazo when next dose due. ClCr ~60 ml/min. Continue vancomycin per previous plan.  Plan: Start piperacillin/tazobactam 3.375g Q 8 hr EI  Vancomycin 2000 mg IV x 1, then 1500 mg IV every 24 hours (eAUC 504, Goal AUC 400-550, SCr 2.14) Clindamycin '600mg'$  Q8hr per MD Monitor renal function, Cx and clinical progression to narrow Vancomycin levels as indicated  Height: 6' (182.9 cm) Weight: (!) 145.8 kg (321 lb 6.9 oz) IBW/kg (Calculated) : 77.6  Temp (24hrs), Avg:102.5 F (39.2 C), Min:101.9 F (38.8 C), Max:103.1 F (39.5 C)  Recent Labs  Lab 06/04/20 1626  WBC 19.4*  CREATININE 2.14*  LATICACIDVEN 1.5    Estimated Creatinine Clearance: 59.2 mL/min (A) (by C-G formula based on SCr of 2.14 mg/dL (H)).    No Known Allergies  Antimicrobials this admission: Vanc 4/16 >>  Piptazo 4/16 >> Clinda 4/16 >> MTZ 4/16   Cefepime 4/16    Microbiology results: 4/16 BCx: sent 4/16 UCx: sent   Thank you for allowing pharmacy to be a part of this patient's care.  Benetta Spar, PharmD, BCPS, BCCP Clinical Pharmacist  Please check AMION for all [place] phone numbers After 10:00 PM, call [place] [phone]

## 2020-06-04 NOTE — ED Triage Notes (Signed)
Emergency Medicine Provider Triage Evaluation Note  Patrick [name] , a 55 y.o. male  was evaluated in triage.  Pt complains of right foot wound.  Noticed for the past 2 weeks that he had a wound there.  Was trying to posterior and then pop this morning.  Reports dry cough and chills.  States that he does not want to be admitted  Review of Systems  Positive: Wound Negative: Vomiting  Physical Exam  BP 118/71 (BP Location: Right Arm)   Pulse (!) 122   Temp (!) 103.1 F (39.5 C) (Oral)   Resp (!) 22   SpO2 97%  Gen:   Awake, no distress   HEENT:  Atraumatic  Resp:  Normal effort  Cardiac:  Normal rate  Abd:   Nondistended, nontender  MSK:   Moves extremities without difficulty, wound noted to R lateral foot without bleeding Neuro:  Speech clear   Medical Decision Making  Medically screening exam initiated at 4:24 PM.  Appropriate orders placed.  Broxton Niklaus Direnzo was informed that the remainder of the evaluation will be completed by another provider, this initial triage assessment does not replace that evaluation, and the importance of remaining in the ED until their evaluation is complete.  Clinical Impression  55 year old male with a history of diabetes presenting for right foot wound.  He is febrile and tachycardic here.  Will initiate work-up and charge nurse to the next   Delia Heady, PA-C 06/04/20 1627

## 2020-06-04 NOTE — Progress Notes (Signed)
Pharmacy Antibiotic Note  Patrick [name] is a 55 y.o. male admitted on 06/04/2020 presenting with R-foot wound, concern for DFI and sepsis.  Pharmacy has been consulted for cefepime and vancomycin dosing.  Hx CKD3, BL ~1.5-1.7  Plan: Vancomycin 2000 mg IV x 1, then 1500 mg IV every 24 hours (eAUC 504, Goal AUC 400-550, SCr 2.14) Cefepime 2g IV every 12 hours Monitor renal function, Cx and clinical progression to narrow Vancomycin levels as indicated  Height: 6' (182.9 cm) Weight: (!) 145.8 kg (321 lb 6.9 oz) IBW/kg (Calculated) : 77.6  Temp (24hrs), Avg:103.1 F (39.5 C), Min:103.1 F (39.5 C), Max:103.1 F (39.5 C)  Recent Labs  Lab 06/04/20 1626  WBC 19.4*  CREATININE 2.14*    Estimated Creatinine Clearance: 59.2 mL/min (A) (by C-G formula based on SCr of 2.14 mg/dL (H)).    No Known Allergies  Bertis Ruddy, PharmD Clinical Pharmacist ED Pharmacist Phone # [phone] 06/04/2020 5:28 PM

## 2020-06-04 NOTE — Sepsis Progress Note (Signed)
Sepsis protocol is being followed by [facility]. 

## 2020-06-04 NOTE — ED Triage Notes (Addendum)
C/o wound to R lateral foot x 2 weeks.  States it has been a blister x 2 weeks and popped this morning.  Also reports dry cough x 2 weeks.  Requesting cough medication.  Reports chills.

## 2020-06-04 NOTE — Consult Note (Signed)
ORTHOPAEDIC CONSULTATION  REQUESTING PHYSICIAN: Etta Quill, DO  Chief Complaint: right foot ulcer  HPI: Patrick [name] is a 55 y.o. male with HTN and uncontrolled diabetes - not on insulin due to lack of insurance and cost. Patient presents to [facility] Emergency Department for a foot wound. Found to be septic in the ER. X-rays of right foot show subcutaneous air in the distal aspect of the fourth and fifth metatarsal and fifth digit. Orthopedics was consulted due to concern about the degree of subcutaneous air seen on x-ray.  Patient seen in 033 in the ED. Patient denies any pain in the right foot. Reports he noticed a blister about 2 weeks ago. He thought it would improve with time. But today the blister popped and he was concerned. He reports feeling weak over the past few days. Reports fevers and chills. Denies any history of foot ulcers or other wound concerns. No prior surgical history. He has known diabetic neuropathy. Ambulates without assistance. 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  Past Medical History:  Diagnosis Date  . Diabetes mellitus   . Gout   . History of COVID-19 04/2019  . Hypertension    History reviewed. No pertinent surgical history. Social History   Socioeconomic History  . Marital status: Married    Spouse name: Not on file  . Number of children: Not on file  . Years of education: Not on file  . Highest education level: Not on file  Occupational History  . Not on file  Tobacco Use  . Smoking status: Never Smoker  . Smokeless tobacco: Never Used  Substance and Sexual Activity  . Alcohol use: No  . Drug use: No  . Sexual activity: Not on file  Other Topics Concern  . Not on file  Social History Narrative  . Not on file   Social Determinants of Health   Financial Resource Strain: Not on file  Food Insecurity: Not on file  Transportation Needs: Not on file  Physical Activity: Not on file  Stress: Not on file  Social Connections: Not on  file   Family History  Problem Relation Age of Onset  . Diabetes Neg Hx    No Known Allergies Prior to Admission medications   Medication Sig Start Date End Date Taking? Authorizing Provider  gabapentin (NEURONTIN) 100 MG capsule Take 100 mg by mouth every evening.   Yes [provider]  losartan-hydrochlorothiazide (HYZAAR) 100-25 MG tablet Take 1 tablet by mouth daily.   Yes [provider]  metFORMIN (GLUCOPHAGE) 500 MG tablet Take 1 tablet (500 mg total) by mouth 2 (two) times daily with a meal. 05/07/19  Yes Ghimire, Henreitta Leber, MD  tiZANidine (ZANAFLEX) 4 MG tablet Take 4 mg by mouth at bedtime. 04/17/19  Yes [provider]  amLODipine (NORVASC) 10 MG tablet Take 1 tablet (10 mg total) by mouth daily. Patient not taking: Reported on 06/04/2020 05/08/19   Jonetta Osgood, MD  blood glucose meter kit and supplies Dispense based on patient and insurance preference. Use up to four times daily as directed. (FOR ICD-10 E10.9, E11.9). 05/07/19   Ghimire, Henreitta Leber, MD  insulin aspart (NOVOLOG) 100 UNIT/ML FlexPen Inject 10 Units into the skin 3 (three) times daily with meals. Patient not taking: Reported on 06/04/2020 05/07/19   Jonetta Osgood, MD  insulin detemir (LEVEMIR) 100 UNIT/ML FlexPen 60 units subcutaneously in a.m., and 28 units in p.m. Patient not taking: Reported on 06/04/2020 05/07/19  Jonetta Osgood, MD  Insulin Pen Needle 32G X 8 MM MISC Use as directed 05/07/19   Jonetta Osgood, MD   DG Chest Port 1 View  Result Date: 06/04/2020 CLINICAL DATA:  Wound check EXAM: PORTABLE CHEST 1 VIEW COMPARISON:  05/02/2019 FINDINGS: Cardiac shadow is within normal limits. The lungs are clear bilaterally. No bony abnormality is noted. IMPRESSION: No acute abnormality seen. Electronically Signed   By: Inez Catalina M.D.   On: 06/04/2020 17:42   DG Foot 2 Views Right  Result Date: 06/04/2020 CLINICAL DATA:  Foot pain, known soft tissue wound EXAM: RIGHT FOOT -  2 VIEW COMPARISON:  None. FINDINGS: Considerable subcutaneous air is noted about the distal aspect of the fifth metatarsal and fifth digit and to a lesser degree about the fourth digit. Degenerative changes of the first MTP joint, tarsal bones and calcaneus are noted. No definitive erosive changes are noted at this time. IMPRESSION: Considerable subcutaneous air consistent with the known history of wound. No bony erosive changes are seen. MRI would be more sensitive for underlying osteomyelitis in this regard. Electronically Signed   By: Inez Catalina M.D.   On: 06/04/2020 17:42   Family History Reviewed and non-contributory, no pertinent history of problems with bleeding or anesthesia      Review of Systems 14 system ROS conducted and negative except for that noted in HPI   OBJECTIVE  Vitals: Patient Vitals for the past 8 hrs:  BP Temp Temp src Pulse Resp SpO2 Height Weight  06/04/20 2045 121/90 -- -- (!) 111 (!) 24 100 % -- --  06/04/20 2015 (!) 116/97 -- -- (!) 105 18 100 % -- --  06/04/20 2000 121/65 -- -- (!) 106 20 99 % -- --  06/04/20 1945 125/81 -- -- (!) 115 (!) 27 99 % -- --  06/04/20 1930 119/66 -- -- (!) 112 (!) 30 98 % -- --  06/04/20 1915 124/70 -- -- (!) 113 (!) 29 98 % -- --  06/04/20 1900 109/80 -- -- (!) 111 (!) 29 97 % -- --  06/04/20 1845 110/80 -- -- (!) 114 (!) 30 99 % -- --  06/04/20 1830 127/65 -- -- (!) 113 (!) 29 99 % -- --  06/04/20 1815 132/68 -- -- (!) 115 (!) 21 99 % -- --  06/04/20 1809 127/68 -- -- (!) 114 (!) 24 100 % -- --  06/04/20 1730 -- (!) 101.9 F (38.8 C) Oral -- -- -- -- --  06/04/20 1730 124/73 -- -- (!) 113 (!) 30 100 % -- --  06/04/20 1715 (!) 144/87 -- -- (!) 112 (!) 24 100 % -- --  06/04/20 1714 -- -- -- -- -- -- 6' (1.829 m) (!) 145.8 kg  06/04/20 1704 -- -- -- (!) 114 (!) 21 98 % -- --  06/04/20 1703 132/70 -- -- -- -- -- -- --  06/04/20 1618 118/71 (!) 103.1 F (39.5 C) Oral (!) 122 (!) 22 97 % -- --   General: Alert, no acute  distress Cardiovascular: Warm extremities noted Respiratory: No cyanosis, no use of accessory musculature GI: No organomegaly, abdomen is soft and non-tender Skin: No lesions in the area of chief complaint other than those listed below in MSK exam.  Neurologic: Sensation intact distally save for the below mentioned MSK exam Psychiatric: Patient is competent for consent with normal mood and affect Lymphatic: No swelling obvious and reported other than the area involved in the exam below  Extremities  Bilateral upper extremities: actively moves upper extremities without pain. Non-tender to palpation throughout. NVI RLE: (see images in media) Wound on right foot just proximal and lateral to 5th toe. Purulent drainage noted. Surrounding fluctuance and skin necrosis. Erythema extending proximally. No significant discoloration of 5th toe. No obvious crepitus surrounding wound. Denies any tenderness throughout the right foot. Wiggles all toes slightly. Neuropathy of right foot. But endorses sensation of dorsum of right foot. Actively moves right ankle without pain. Non-tender to palpation right ankle, calf, knee, hip. Compartments compressible. Edema of right lower extremity. Chronic skin changes consistent with stasis dermatitis. No other skin wounds noted. Foot warm to the touch, but unable to get any distal palpable pulses.  LLE: Non-tender left hip, knee, ankle, foot. Actively moves left lower extremity without pain. Edema lower extremity. Chronic skin changes of left lower extremity consistent with stasis dermatitis. Neuropathy of left foot. Endorses sensation of dorsum of left foot and proximally. Limited sensation of the toes. Foot warm to the touch, but difficulty appreciating a palpable pulse.     Test Results Imaging X-ray of right foot demonstrates subcutaneous air in the distal aspect of fifth metatarsal, fifth digit, and some about fourth digit.  Labs cbc Recent Labs    06/04/20 1626   WBC 19.4*  HGB 9.5*  HCT 30.5*  PLT 439*    Labs inflam Recent Labs    06/04/20 2044  CRP 24.4*    Labs coag Recent Labs    06/04/20 1756  INR 1.2    Recent Labs    06/04/20 1626  NA 129*  K 4.4  CL 98  CO2 20*  GLUCOSE 393*  BUN 28*  CREATININE 2.14*  CALCIUM 8.6*     ASSESSMENT AND PLAN: 55 y.o. male with the following: Sepsis due to cellulitis and infected diabetic foot ulcer.   This patient requires inpatient admission to manage this problem appropriately. Patient with septic wound due to diabetic foot ulcer.  Recommend broad spectrum antibiotics. Patient also showing ischemic changes about the 5th toe. Recommend vascular study. ABIs have been ordered by medicine team. Due to patient's decline kidney function, did not order CT angio of the lower extremity. If any vascular concerns on ABIs, recommend formal vascular consultation. If vascular studies are okay, Dr. Sharol Given, foot and ankle specialist/complex wound specialist, will see the patient. Keep NPO. If patient shows signs of worsening/decline, will require emergent intervention. Discussed with the patient the need for irrigation and debridement versus transmetatarsal amputation. Patient stated his understanding of this.   - Keep NPO - Weight Bearing Status/Activity: NWB RLE - Additional recommended labs/tests: ABIs.  - VTE Prophylaxis: per medicine - Pain control: PRN pain medications, however patient has not complained of any pain - Follow-up plan: TBD. Depending on results of vascular studies - may require formal vascular consultation.    Noemi Chapel, PA-C 06/04/2020

## 2020-06-04 NOTE — H&P (Signed)
History and Physical    Patrick [name] EPP:[id number] DOB: August 10, 1965 DOA: 06/04/2020  PCP: [name]  Patient coming from: Home  I have personally briefly reviewed patient's old medical records in [place]  Chief Complaint: Foot wound  HPI: Patrick [name] is a 55 y.o. male with medical history significant of DM2, HTN.  Pt with wound to R lateral foot for 2 weeks now.  Had "blister" on side of foot that popped today.  Today has fever, and presents to ED for evaluation.  Has minimal to no pain in foot even with bad looking wound, cant feel much at all in feet (due to diabetic neuropathy).  Has not been on insulins at all recently due to cost and no insurance until recently.  Has had cough, non-productive, this has been ongoing and unchanged since he had COVID-19 just over a year ago (March 21).  He has had COVID vaccines following his COVID-19.  No abd pain, no N/V.  No wound care history, not seen foot specialist, not ever had surgery on foot.   ED Course: Pt is septic: Tm 103.1, HR 122, RR 30, WBC 19.4k.  Lactate nl.  HGB 9.5.  Sodium 129, BUN 28, creat 2.1 (up from 1.7 on discharge from his [place] admission in March 21).  CXR neg.  COVID pending.  Got cefepime / flagyl / vanc  Got sepsis fluid bolus.   Review of Systems: As per HPI, otherwise all review of systems negative.  Past Medical History:  Diagnosis Date  . Diabetes mellitus   . Gout   . Hypertension     History reviewed. No pertinent surgical history.   reports that he has never smoked. He has never used smokeless tobacco. He reports that he does not drink alcohol and does not use drugs.  No Known Allergies  Family History  Problem Relation Age of Onset  . Diabetes Neg Hx      Prior to Admission medications   Medication Sig Start Date End Date Taking? Authorizing Provider  gabapentin (NEURONTIN) 100 MG capsule Take 100 mg by mouth every evening.   Yes  [provider]  losartan-hydrochlorothiazide (HYZAAR) 100-25 MG tablet Take 1 tablet by mouth daily.   Yes [provider]  metFORMIN (GLUCOPHAGE) 500 MG tablet Take 1 tablet (500 mg total) by mouth 2 (two) times daily with a meal. 05/07/19  Yes Ghimire, Henreitta Leber, MD  tiZANidine (ZANAFLEX) 4 MG tablet Take 4 mg by mouth at bedtime. 04/17/19  Yes [provider]  amLODipine (NORVASC) 10 MG tablet Take 1 tablet (10 mg total) by mouth daily. Patient not taking: Reported on 06/04/2020 05/08/19   Jonetta Osgood, MD  blood glucose meter kit and supplies Dispense based on patient and insurance preference. Use up to four times daily as directed. (FOR ICD-10 E10.9, E11.9). 05/07/19   Ghimire, Henreitta Leber, MD  insulin aspart (NOVOLOG) 100 UNIT/ML FlexPen Inject 10 Units into the skin 3 (three) times daily with meals. Patient not taking: Reported on 06/04/2020 05/07/19   Jonetta Osgood, MD  insulin detemir (LEVEMIR) 100 UNIT/ML FlexPen 60 units subcutaneously in a.m., and 28 units in p.m. Patient not taking: Reported on 06/04/2020 05/07/19   Jonetta Osgood, MD  Insulin Pen Needle 32G X 8 MM MISC Use as directed 05/07/19   Jonetta Osgood, MD    Physical Exam: Vitals:   06/04/20 1815 06/04/20 1830 06/04/20 1845 06/04/20 1900  BP: 132/68 127/65 110/80 109/80  Pulse: (!) 115 (!) 113 (!) 114 (!) 111  Resp: (!) 21 (!) 29 (!) 30 (!) 29  Temp:      TempSrc:      SpO2: 99% 99% 99% 97%  Weight:      Height:        Constitutional: Ill appearing Eyes: PERRL, lids and conjunctivae normal ENMT: Mucous membranes are moist. Posterior pharynx clear of any exudate or lesions.Normal dentition.  Neck: normal, supple, no masses, no thyromegaly Respiratory: clear to auscultation bilaterally, no wheezing, no crackles. Normal respiratory effort. No accessory muscle use.  Cardiovascular: Regular rate and rhythm, no murmurs / rubs / gallops. No extremity edema. 2+ pedal pulses. No carotid  bruits.  Abdomen: no tenderness, no masses palpated. No hepatosplenomegaly. Bowel sounds positive.  Musculoskeletal: no clubbing / cyanosis. No joint deformity upper and lower extremities. Good ROM, no contractures. Normal muscle tone.  Skin: R foot with wound to lateral aspect of foot just proximal to 5th toe with surrounding necrosis noted.  Erythema spreads proximally.   Neurologic: CN 2-12 grossly intact. Sensation intact, DTR normal. Strength 5/5 in all 4.  Psychiatric: Normal judgment and insight. Alert and oriented x 3. Normal mood.    Labs on Admission: I have personally reviewed following labs and imaging studies  CBC: Recent Labs  Lab 06/04/20 1626  WBC 19.4*  NEUTROABS 16.6*  HGB 9.5*  HCT 30.5*  MCV 86.2  PLT 371*   Basic Metabolic Panel: Recent Labs  Lab 06/04/20 1626  NA 129*  K 4.4  CL 98  CO2 20*  GLUCOSE 393*  BUN 28*  CREATININE 2.14*  CALCIUM 8.6*   GFR: Estimated Creatinine Clearance: 59.2 mL/min (A) (by C-G formula based on SCr of 2.14 mg/dL (H)). Liver Function Tests: Recent Labs  Lab 06/04/20 1626  AST 25  ALT 19  ALKPHOS 123  BILITOT 0.9  PROT 7.1  ALBUMIN 2.7*   No results for input(s): LIPASE, AMYLASE in the last 168 hours. No results for input(s): AMMONIA in the last 168 hours. Coagulation Profile: Recent Labs  Lab 06/04/20 1756  INR 1.2   Cardiac Enzymes: No results for input(s): CKTOTAL, CKMB, CKMBINDEX, TROPONINI in the last 168 hours. BNP (last 3 results) No results for input(s): PROBNP in the last 8760 hours. HbA1C: No results for input(s): HGBA1C in the last 72 hours. CBG: No results for input(s): GLUCAP in the last 168 hours. Lipid Profile: No results for input(s): CHOL, HDL, LDLCALC, TRIG, CHOLHDL, LDLDIRECT in the last 72 hours. Thyroid Function Tests: No results for input(s): TSH, T4TOTAL, FREET4, T3FREE, THYROIDAB in the last 72 hours. Anemia Panel: No results for input(s): VITAMINB12, FOLATE, FERRITIN, TIBC,  IRON, RETICCTPCT in the last 72 hours. Urine analysis:    Component Value Date/Time   COLORURINE YELLOW 05/03/2019 1545   APPEARANCEUR CLEAR 05/03/2019 1545   LABSPEC 1.018 05/03/2019 1545   PHURINE 5.0 05/03/2019 1545   GLUCOSEU >=500 (A) 05/03/2019 1545   HGBUR SMALL (A) 05/03/2019 1545   HGBUR negative 01/27/2010 0934   BILIRUBINUR NEGATIVE 05/03/2019 1545   KETONESUR NEGATIVE 05/03/2019 1545   PROTEINUR 100 (A) 05/03/2019 1545   UROBILINOGEN 0.2 10/26/2013 1130   NITRITE NEGATIVE 05/03/2019 1545   LEUKOCYTESUR NEGATIVE 05/03/2019 1545    Radiological Exams on Admission: DG Chest Port 1 View  Result Date: 06/04/2020 CLINICAL DATA:  Wound check EXAM: PORTABLE CHEST 1 VIEW COMPARISON:  05/02/2019 FINDINGS: Cardiac shadow is within normal limits. The lungs are clear bilaterally. No bony abnormality  is noted. IMPRESSION: No acute abnormality seen. Electronically Signed   By: Inez Catalina M.D.   On: 06/04/2020 17:42   DG Foot 2 Views Right  Result Date: 06/04/2020 CLINICAL DATA:  Foot pain, known soft tissue wound EXAM: RIGHT FOOT - 2 VIEW COMPARISON:  None. FINDINGS: Considerable subcutaneous air is noted about the distal aspect of the fifth metatarsal and fifth digit and to a lesser degree about the fourth digit. Degenerative changes of the first MTP joint, tarsal bones and calcaneus are noted. No definitive erosive changes are noted at this time. IMPRESSION: Considerable subcutaneous air consistent with the known history of wound. No bony erosive changes are seen. MRI would be more sensitive for underlying osteomyelitis in this regard. Electronically Signed   By: Inez Catalina M.D.   On: 06/04/2020 17:42    EKG: Independently reviewed.  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 Sepsis due to cellulitis, diabetic foot ulcer and infection - concern fo gas gangrene of foot 1. Nec fash vs 'just' bad diabetic foot infection with subq air and sepsis. 2. Specifically, I am concerned given that degree of subq air demonstrated on X ray seems more extensive than just the wound area (see photo above). 3. Extremity wound and cellulitis and sepsis pathways 4. IVF: got sepsis fluid bolus in ED 5. ABx: got cefepime / flagyl / vanc in ED 6. Will switch to zosyn / clinda / vanc for possible nec fash 7. EDP has called Dr. Griffin Basil who will make the ultimate decision as to wether this needs to go to OR tonight or not. 1. NPO until then 2. SCDs only for DVT ppx until then 2. Renal insufficiency - 1. Unclear how much is acute and how much is chronic 2. Repeat CMP in AM 3. IVF as above 4. Strict intake and output 3. HTN - 1. Hold home BP meds 4. DM2 - 1. Levemir 20u BID for the moment 2. Mod scale SSI Q4H  DVT prophylaxis: SCDs Code Status: Full Family Communication: No family in room Disposition Plan: Home after sepsis and diabetic foot infection resolved Consults called: Dr. Griffin Basil Admission status: Admit to inpatient  Severity of Illness: The appropriate patient status for this patient is INPATIENT. Inpatient status is judged to be reasonable and necessary in order to provide the required intensity of service to ensure the patient's safety. The patient's presenting symptoms, physical exam findings, and initial radiographic and laboratory data in the context of their chronic comorbidities is felt to place them at high risk for further clinical deterioration. Furthermore, it is not anticipated that the patient will be medically stable for discharge from the hospital within 2 midnights of admission. The following factors support the patient status of inpatient.   IP status for limb / life threatening infection of  foot.   * I certify that at the point of admission it is my clinical judgment that the patient will require inpatient hospital care spanning beyond 2 midnights from the point of admission due to high intensity of service, high risk for further deterioration and high frequency of surveillance required.*    Darwyn Ponzo M. DO [facility]  How to contact the [facility] Attending or Consulting  provider [place] or covering provider during after hours [place], for this patient?  1. Check the care team in [facility] and look for a) attending/consulting [facility] provider listed and b) the [facility] team listed 2. Log into [email or web address]  Amion Physician Scheduling and messaging for groups and whole hospitals  On call and physician scheduling software for group practices, residents, hospitalists and other medical providers for call, clinic, rotation and shift schedules. OnCall [employer] is a hospital-wide system for scheduling doctors and paging doctors on call. EasyPlot is for scientific plotting and data analysis.  [email or web address]  and use [place]'s universal password to access. If you do not have the password, please contact the hospital operator.  3. Locate the [facility] provider you are looking for under [facility] and page to a number that you can be directly reached. 4. If you still have difficulty reaching the provider, please page the [facility] (Director on Call) for the Hospitalists listed on amion for assistance.  06/04/2020, 8:04 PM

## 2020-06-04 NOTE — ED Provider Notes (Signed)
Presents [facility] EMERGENCY DEPARTMENT Provider Note   CSN: [id number] Arrival date & time: 06/04/20  1545     History Chief Complaint  Patient presents with  . Cough  . Wound Check    Patrick [name] is a 55 y.o. male.  HPI   55 year old male history of type 2 diabetes, hypertension, today with wound to foot for 2 weeks now worsening, fever, tachycardia.  He states that he noted a blister on his foot about 2 weeks ago.  Notes that today it popped.  He has had increased pain, fever, and presents for evaluation.  He endorses that he has had a cough but has had a cough since he had Covid over a year ago.  He has since had his vaccines.  He has not noted any change in his cough or any productive cough.  Primary care is Dr. Jackson Latino.  He has not seen a foot specialist and has not had any prior surgery on his extremities.  Reports blood sugars have been running in the low 100s  Past Medical History:  Diagnosis Date  . Diabetes mellitus   . Gout   . Hypertension     Patient Active Problem List   Diagnosis Date Noted  . Acute respiratory disease due to COVID-19 virus 05/03/2019  . Renal insufficiency 05/03/2019  . GOUT 01/27/2010  . OBESITY 01/27/2010  . Insulin-requiring or dependent type II diabetes mellitus ([place]) 02/20/2008  . HYPERTENSION, BENIGN ESSENTIAL 02/20/2008    History reviewed. No pertinent surgical history.     History reviewed. No pertinent family history.  Social History   Tobacco Use  . Smoking status: Never Smoker  . Smokeless tobacco: Never Used  Substance Use Topics  . Alcohol use: No  . Drug use: No    Home Medications Prior to Admission medications   Medication Sig Start Date End Date Taking? Authorizing Provider  amLODipine (NORVASC) 10 MG tablet Take 1 tablet (10 mg total) by mouth daily. 05/08/19   Ghimire, Henreitta Leber, MD  blood glucose meter kit and supplies Dispense based on patient and insurance preference. Use up  to four times daily as directed. (FOR ICD-10 E10.9, E11.9). 05/07/19   Ghimire, Henreitta Leber, MD  insulin aspart (NOVOLOG) 100 UNIT/ML FlexPen Inject 10 Units into the skin 3 (three) times daily with meals. 05/07/19   Ghimire, Henreitta Leber, MD  insulin detemir (LEVEMIR) 100 UNIT/ML FlexPen 60 units subcutaneously in a.m., and 28 units in p.m. 05/07/19   Ghimire, Henreitta Leber, MD  Insulin Pen Needle 32G X 8 MM MISC Use as directed 05/07/19   Ghimire, Henreitta Leber, MD  metFORMIN (GLUCOPHAGE) 500 MG tablet Take 1 tablet (500 mg total) by mouth 2 (two) times daily with a meal. 05/07/19   Ghimire, Henreitta Leber, MD  tiZANidine (ZANAFLEX) 4 MG tablet Take 4 mg by mouth 2 (two) times daily as needed for muscle spasms.  04/17/19   [provider]    Allergies    Patient has no known allergies.  Review of Systems   Review of Systems  Constitutional: Positive for fever.  HENT: Negative.   Eyes: Negative.   Respiratory: Positive for cough. Negative for shortness of breath.   Cardiovascular: Negative.   Gastrointestinal: Positive for nausea.  Endocrine: Positive for polydipsia.  Genitourinary: Negative.   Musculoskeletal: Negative.   Skin: Positive for wound.  Allergic/Immunologic: Negative.   Neurological: Negative.   Hematological: Negative.   Psychiatric/Behavioral: Negative.   All other systems reviewed  and are negative.   Physical Exam Updated Vital Signs BP 132/70   Pulse (!) 114   Temp (!) 103.1 F (39.5 C) (Oral)   Resp (!) 21   Ht 1.829 m (6')   Wt (!) 145.8 kg   SpO2 98%   BMI 43.59 kg/m   Physical Exam Vitals and nursing note reviewed.  Constitutional:      General: He is not in acute distress.    Appearance: Normal appearance. He is obese. He is ill-appearing.  HENT:     Head: Normocephalic.     Right Ear: External ear normal.     Left Ear: External ear normal.     Nose: Nose normal.     Mouth/Throat:     Mouth: Mucous membranes are moist.  Eyes:     Pupils: Pupils are  equal, round, and reactive to light.  Cardiovascular:     Rate and Rhythm: Regular rhythm. Tachycardia present.     Pulses: Normal pulses.     Heart sounds: Normal heart sounds.  Pulmonary:     Effort: Pulmonary effort is normal.     Breath sounds: Normal breath sounds.  Abdominal:     General: Bowel sounds are normal. There is no distension.     Palpations: Abdomen is soft.  Musculoskeletal:        General: Swelling present.     Cervical back: Normal range of motion.     Comments: Bilateral lower extremity with some swelling but no pitting edema Right foot has wound to the right lateral aspect of the foot at the small toe with discoloration and blistering noted There is erythema spreading proximally over the right foot up to the ankle. Pulses are intact   Skin:    Capillary Refill: Capillary refill takes less than 2 seconds.  Neurological:     General: No focal deficit present.     Mental Status: He is alert.  Psychiatric:        Mood and Affect: Mood normal.        Behavior: Behavior normal.     ED Results / Procedures / Treatments   Labs (all labs ordered are listed, but only abnormal results are displayed) Labs Reviewed  CBC WITH DIFFERENTIAL/PLATELET - Abnormal; Notable for the following components:      Result Value   WBC 19.4 (*)    RBC 3.54 (*)    Hemoglobin 9.5 (*)    HCT 30.5 (*)    Platelets 439 (*)    Neutro Abs 16.6 (*)    Monocytes Absolute 1.2 (*)    Abs Immature Granulocytes 0.20 (*)    All other components within normal limits  SARS CORONAVIRUS 2 (TAT 6-24 HRS)  CULTURE, BLOOD (ROUTINE X 2)  CULTURE, BLOOD (ROUTINE X 2)  URINE CULTURE  LACTIC ACID, PLASMA  LACTIC ACID, PLASMA  COMPREHENSIVE METABOLIC PANEL  URINALYSIS, ROUTINE W REFLEX MICROSCOPIC  CBC WITH DIFFERENTIAL/PLATELET  PROTIME-INR  APTT  CBG MONITORING, ED    EKG EKG Interpretation  Date/Time:  Saturday June 04 2020 17:36:14 EDT Ventricular Rate:  112 PR Interval:  182 QRS  Duration: 95 QT Interval:  306 QTC Calculation: 418 R Axis:   44 Text Interpretation: Sinus tachycardia Low voltage, precordial leads Confirmed by Pattricia Boss [phone]) on 06/04/2020 5:38:56 PM   Radiology DG Chest Port 1 View  Result Date: 06/04/2020 CLINICAL DATA:  Wound check EXAM: PORTABLE CHEST 1 VIEW COMPARISON:  05/02/2019 FINDINGS: Cardiac shadow is within normal limits. The  lungs are clear bilaterally. No bony abnormality is noted. IMPRESSION: No acute abnormality seen. Electronically Signed   By: Inez Catalina M.D.   On: 06/04/2020 17:42   DG Foot 2 Views Right  Result Date: 06/04/2020 CLINICAL DATA:  Foot pain, known soft tissue wound EXAM: RIGHT FOOT - 2 VIEW COMPARISON:  None. FINDINGS: Considerable subcutaneous air is noted about the distal aspect of the fifth metatarsal and fifth digit and to a lesser degree about the fourth digit. Degenerative changes of the first MTP joint, tarsal bones and calcaneus are noted. No definitive erosive changes are noted at this time. IMPRESSION: Considerable subcutaneous air consistent with the known history of wound. No bony erosive changes are seen. MRI would be more sensitive for underlying osteomyelitis in this regard. Electronically Signed   By: Inez Catalina M.D.   On: 06/04/2020 17:42    Procedures .Critical Care Performed by: Pattricia Boss, MD Authorized by: Pattricia Boss, MD   Critical care provider statement:    Critical care time (minutes):  45   Critical care end time:  06/04/2020 8:27 PM   Critical care was necessary to treat or prevent imminent or life-threatening deterioration of the following conditions:  Sepsis   Critical care was time spent personally by me on the following activities:  Discussions with consultants, evaluation of patient's response to treatment, examination of patient, ordering and performing treatments and interventions, ordering and review of laboratory studies, ordering and review of radiographic studies,  pulse oximetry, re-evaluation of patient's condition, obtaining history from patient or surrogate and review of old charts     Medications Ordered in ED Medications  lactated ringers infusion (has no administration in time range)  lactated ringers bolus 1,000 mL (has no administration in time range)    And  lactated ringers bolus 1,000 mL (has no administration in time range)    And  lactated ringers bolus 1,000 mL (has no administration in time range)    And  lactated ringers bolus 1,000 mL (has no administration in time range)    And  lactated ringers bolus 500 mL (has no administration in time range)  ceFEPIme (MAXIPIME) 2 g in sodium chloride 0.9 % 100 mL IVPB (has no administration in time range)  metroNIDAZOLE (FLAGYL) IVPB 500 mg (has no administration in time range)  vancomycin (VANCOREADY) IVPB 2000 mg/400 mL (has no administration in time range)  acetaminophen (TYLENOL) tablet 650 mg (650 mg Oral Given 06/04/20 1624)    ED Course  I have reviewed the triage vital signs and the nursing notes.  Pertinent labs & imaging results that were available during my care of the patient were reviewed by me and considered in my medical decision making (see chart for details).    MDM Rules/Calculators/A&P                          55 year old man history of known diabetes presents today with foot wound and fever.  He is initially tachycardic and febrile to 103.  Patient covered with broad-spectrum antibiotics and IV fluids.  Initial lactic is normal.  Heart rate has decreased to 113 with temp down to 101.9.  Air seen on foot x-Patrick [name] consistent with infection and blister that has ruptured.  No definitive osteomyelitis. Creatinine increased to 2.14 which is increased from last creatinine March 18 of 1.71.  However, he has been elevated previously in March 2021 Patient with known diabetes and blood sugar elevated at 393.  CO2 is decreased to 20-anion gap 11 1 foot infection with systemic  symptoms and sepsis although lactic acid is normal.  Patient has been tachycardic and febrile.  He is covered here with fluids and broad-spectrum antibiotics Air noted on plain films. 2 diabetes with hyperglycemia but no evidence of DKA plan IV insulin 3 AKI likely secondary to #1 Plan admission for further evaluation and treatment  Final Clinical Impression(s) / ED Diagnoses Final diagnoses:  None    Rx / DC Orders ED Discharge Orders    None       Pattricia Boss, MD 06/04/20 2355

## 2020-06-05 ENCOUNTER — Inpatient Hospital Stay (HOSPITAL_COMMUNITY): Payer: 59

## 2020-06-05 ENCOUNTER — Encounter (HOSPITAL_COMMUNITY): Payer: Self-pay | Admitting: Internal Medicine

## 2020-06-05 DIAGNOSIS — A419 Sepsis, unspecified organism: Secondary | ICD-10-CM

## 2020-06-05 DIAGNOSIS — L089 Local infection of the skin and subcutaneous tissue, unspecified: Secondary | ICD-10-CM

## 2020-06-05 DIAGNOSIS — A480 Gas gangrene: Secondary | ICD-10-CM | POA: Diagnosis not present

## 2020-06-05 DIAGNOSIS — L0390 Cellulitis, unspecified: Secondary | ICD-10-CM

## 2020-06-05 DIAGNOSIS — L97519 Non-pressure chronic ulcer of other part of right foot with unspecified severity: Secondary | ICD-10-CM

## 2020-06-05 DIAGNOSIS — E11628 Type 2 diabetes mellitus with other skin complications: Secondary | ICD-10-CM

## 2020-06-05 DIAGNOSIS — I96 Gangrene, not elsewhere classified: Secondary | ICD-10-CM

## 2020-06-05 DIAGNOSIS — E11621 Type 2 diabetes mellitus with foot ulcer: Secondary | ICD-10-CM | POA: Diagnosis not present

## 2020-06-05 DIAGNOSIS — E43 Unspecified severe protein-calorie malnutrition: Secondary | ICD-10-CM

## 2020-06-05 LAB — GLUCOSE, CAPILLARY
Glucose-Capillary: 209 mg/dL — ABNORMAL HIGH (ref 70–99)
Glucose-Capillary: 209 mg/dL — ABNORMAL HIGH (ref 70–99)
Glucose-Capillary: 199 mg/dL — ABNORMAL HIGH (ref 70–99)
Glucose-Capillary: 282 mg/dL — ABNORMAL HIGH (ref 70–99)
Glucose-Capillary: 264 mg/dL — ABNORMAL HIGH (ref 70–99)

## 2020-06-05 LAB — CBC
HCT: 27 % — ABNORMAL LOW (ref 39.0–52.0)
Hemoglobin: 8.6 g/dL — ABNORMAL LOW (ref 13.0–17.0)
MCH: 27 pg (ref 26.0–34.0)
MCHC: 31.9 g/dL (ref 30.0–36.0)
MCV: 84.9 fL (ref 80.0–100.0)
Platelets: 417 10*3/uL — ABNORMAL HIGH (ref 150–400)
RBC: 3.18 MIL/uL — ABNORMAL LOW (ref 4.22–5.81)
RDW: 13.2 % (ref 11.5–15.5)
WBC: 18.6 10*3/uL — ABNORMAL HIGH (ref 4.0–10.5)
nRBC: 0 % (ref 0.0–0.2)

## 2020-06-05 LAB — COMPREHENSIVE METABOLIC PANEL
ALT: 20 U/L (ref 0–44)
AST: 25 U/L (ref 15–41)
Albumin: 2.3 g/dL — ABNORMAL LOW (ref 3.5–5.0)
Alkaline Phosphatase: 113 U/L (ref 38–126)
Anion gap: 8 (ref 5–15)
BUN: 26 mg/dL — ABNORMAL HIGH (ref 6–20)
CO2: 23 mmol/L (ref 22–32)
Calcium: 8.5 mg/dL — ABNORMAL LOW (ref 8.9–10.3)
Chloride: 103 mmol/L (ref 98–111)
Creatinine, Ser: 1.98 mg/dL — ABNORMAL HIGH (ref 0.61–1.24)
GFR, Estimated: 40 mL/min — ABNORMAL LOW (ref >60)
Glucose, Bld: 231 mg/dL — ABNORMAL HIGH (ref 70–99)
Potassium: 3.8 mmol/L (ref 3.5–5.1)
Sodium: 134 mmol/L — ABNORMAL LOW (ref 135–145)
Total Bilirubin: 0.8 mg/dL (ref 0.3–1.2)
Total Protein: 6.1 g/dL — ABNORMAL LOW (ref 6.5–8.1)

## 2020-06-05 LAB — MRSA PCR SCREENING: MRSA by PCR: NEGATIVE

## 2020-06-05 MED ORDER — POVIDONE-IODINE 10 % EX SWAB: 2.0000 "application " | Freq: Once | CUTANEOUS | Status: DC

## 2020-06-05 MED ORDER — INSULIN ASPART 100 UNIT/ML ~~LOC~~ SOLN: 8.0000 [IU] | Freq: Three times a day (TID) | SUBCUTANEOUS | Status: DC

## 2020-06-05 MED ORDER — INSULIN ASPART 100 UNIT/ML ~~LOC~~ SOLN
0.0000 [IU] | Freq: Three times a day (TID) | SUBCUTANEOUS | Status: DC
Start: 2020-06-05 — End: 2020-06-05

## 2020-06-05 MED ORDER — CHLORHEXIDINE GLUCONATE 4 % EX LIQD
60.0000 mL | Freq: Once | CUTANEOUS | Status: DC
  Filled 2020-06-05: qty 60

## 2020-06-05 MED ORDER — INSULIN ASPART 100 UNIT/ML ~~LOC~~ SOLN
0.0000 [IU] | Freq: Every day | SUBCUTANEOUS | Status: DC
  Administered 2020-06-05 – 2020-06-08 (×3): 3 [IU] via SUBCUTANEOUS

## 2020-06-05 MED ORDER — INSULIN ASPART 100 UNIT/ML ~~LOC~~ SOLN
8.0000 [IU] | Freq: Three times a day (TID) | SUBCUTANEOUS | Status: DC
  Administered 2020-06-05 – 2020-06-09 (×5): 8 [IU] via SUBCUTANEOUS

## 2020-06-05 MED ORDER — SODIUM CHLORIDE 0.9 % IV SOLN: INTRAVENOUS | Status: DC

## 2020-06-05 MED ORDER — CEFAZOLIN IN SODIUM CHLORIDE 3-0.9 GM/100ML-% IV SOLN
3.0000 g | INTRAVENOUS | Status: AC
  Administered 2020-06-06: 3 g via INTRAVENOUS
  Filled 2020-06-05 (×2): qty 100

## 2020-06-05 MED ORDER — INSULIN DETEMIR 100 UNIT/ML ~~LOC~~ SOLN
30.0000 [IU] | Freq: Two times a day (BID) | SUBCUTANEOUS | Status: DC
  Administered 2020-06-05 – 2020-06-06 (×3): 30 [IU] via SUBCUTANEOUS
  Filled 2020-06-05 (×5): qty 0.3

## 2020-06-05 MED ORDER — INSULIN ASPART 100 UNIT/ML ~~LOC~~ SOLN
0.0000 [IU] | Freq: Three times a day (TID) | SUBCUTANEOUS | Status: DC
  Administered 2020-06-05: 3 [IU] via SUBCUTANEOUS
  Administered 2020-06-05: 8 [IU] via SUBCUTANEOUS
  Administered 2020-06-06 (×2): 3 [IU] via SUBCUTANEOUS
  Administered 2020-06-06: 2 [IU] via SUBCUTANEOUS
  Administered 2020-06-07 (×3): 15 [IU] via SUBCUTANEOUS
  Administered 2020-06-08: 3 [IU] via SUBCUTANEOUS
  Administered 2020-06-08: 5 [IU] via SUBCUTANEOUS
  Administered 2020-06-09: 8 [IU] via SUBCUTANEOUS
  Administered 2020-06-09 (×2): 3 [IU] via SUBCUTANEOUS

## 2020-06-05 NOTE — Plan of Care (Signed)

## 2020-06-05 NOTE — H&P (View-Only) (Signed)
  ORTHOPAEDIC CONSULTATION  REQUESTING PHYSICIAN: Pahwani, Ravi, MD  Chief Complaint: Abscess ulceration and purulent drainage right foot.  HPI: Patrick [name] is a 54 y.o. male who presents with history of uncontrolled type 2 diabetes who noticed increased ulceration abscess and odor right foot.  Past Medical History:  Diagnosis Date  . Diabetes mellitus   . Gout   . History of COVID-19 04/2019  . Hypertension    History reviewed. No pertinent surgical history. Social History   Socioeconomic History  . Marital status: Married    Spouse name: Not on file  . Number of children: Not on file  . Years of education: Not on file  . Highest education level: Not on file  Occupational History  . Not on file  Tobacco Use  . Smoking status: Never Smoker  . Smokeless tobacco: Never Used  Substance and Sexual Activity  . Alcohol use: No  . Drug use: No  . Sexual activity: Not on file  Other Topics Concern  . Not on file  Social History Narrative  . Not on file   Social Determinants of Health   Financial Resource Strain: Not on file  Food Insecurity: Not on file  Transportation Needs: Not on file  Physical Activity: Not on file  Stress: Not on file  Social Connections: Not on file   Family History  Problem Relation Age of Onset  . Diabetes Neg Hx    - negative except otherwise stated in the family history section No Known Allergies Prior to Admission medications   Medication Sig Start Date End Date Taking? Authorizing Provider  gabapentin (NEURONTIN) 100 MG capsule Take 100 mg by mouth every evening.   Yes [provider]  losartan-hydrochlorothiazide (HYZAAR) 100-25 MG tablet Take 1 tablet by mouth daily.   Yes [provider]  metFORMIN (GLUCOPHAGE) 500 MG tablet Take 1 tablet (500 mg total) by mouth 2 (two) times daily with a meal. 05/07/19  Yes Ghimire, Shanker M, MD  tiZANidine (ZANAFLEX) 4 MG tablet Take 4 mg by mouth at bedtime. 04/17/19   Yes [provider]  amLODipine (NORVASC) 10 MG tablet Take 1 tablet (10 mg total) by mouth daily. Patient not taking: Reported on 06/04/2020 05/08/19   Ghimire, Shanker M, MD  blood glucose meter kit and supplies Dispense based on patient and insurance preference. Use up to four times daily as directed. (FOR ICD-10 E10.9, E11.9). 05/07/19   Ghimire, Shanker M, MD  insulin aspart (NOVOLOG) 100 UNIT/ML FlexPen Inject 10 Units into the skin 3 (three) times daily with meals. Patient not taking: Reported on 06/04/2020 05/07/19   Ghimire, Shanker M, MD  insulin detemir (LEVEMIR) 100 UNIT/ML FlexPen 60 units subcutaneously in a.m., and 28 units in p.m. Patient not taking: Reported on 06/04/2020 05/07/19   Ghimire, Shanker M, MD  Insulin Pen Needle 32G X 8 MM MISC Use as directed 05/07/19   Ghimire, Shanker M, MD   DG Chest Port 1 View  Result Date: 06/04/2020 CLINICAL DATA:  Wound check EXAM: PORTABLE CHEST 1 VIEW COMPARISON:  05/02/2019 FINDINGS: Cardiac shadow is within normal limits. The lungs are clear bilaterally. No bony abnormality is noted. IMPRESSION: No acute abnormality seen. Electronically Signed   By: Mark  Lukens M.D.   On: 06/04/2020 17:42   DG Foot 2 Views Right  Result Date: 06/04/2020 CLINICAL DATA:  Foot pain, known soft tissue wound EXAM: RIGHT FOOT - 2 VIEW COMPARISON:  None. FINDINGS: Considerable subcutaneous air is noted about   the distal aspect of the fifth metatarsal and fifth digit and to a lesser degree about the fourth digit. Degenerative changes of the first MTP joint, tarsal bones and calcaneus are noted. No definitive erosive changes are noted at this time. IMPRESSION: Considerable subcutaneous air consistent with the known history of wound. No bony erosive changes are seen. MRI would be more sensitive for underlying osteomyelitis in this regard. Electronically Signed   By: Mark  Lukens M.D.   On: 06/04/2020 17:42   - pertinent xrays, CT, MRI studies were reviewed and  independently interpreted  Positive ROS: All other systems have been reviewed and were otherwise negative with the exception of those mentioned in the HPI and as above.  Physical Exam: General: Alert, no acute distress Psychiatric: Patient is competent for consent with normal mood and affect Lymphatic: No axillary or cervical lymphadenopathy Cardiovascular: No pedal edema Respiratory: No cyanosis, no use of accessory musculature GI: No organomegaly, abdomen is soft and non-tender    Images:  @ENCIMAGES@  Labs:  Lab Results  Component Value Date   HGBA1C 12.4 (H) 05/03/2019   ESRSEDRATE 105 (H) 06/04/2020   CRP 24.4 (H) 06/04/2020   CRP 1.2 (H) 05/07/2019   CRP 1.8 (H) 05/06/2019    Lab Results  Component Value Date   ALBUMIN 2.3 (L) 06/05/2020   ALBUMIN 2.7 (L) 06/04/2020   ALBUMIN 2.7 (L) 05/07/2019   PREALBUMIN 5.1 (L) 06/04/2020    No results found for: CBC  Neurologic: Patient does not have protective sensation bilateral lower extremities.   MUSCULOSKELETAL:   Skin: Examination patient has cellulitis and necrotic ulceration around the fifth metatarsal head.  Patient has an ulcer that probes to bone.  There is foul-smelling odor and drainage.  Patient does not have a palpable pulse due to swelling however patient's Doppler shows biphasic flow with an ankle-brachial indices around 1.1.  Radiographs do not show any calcified vessels.  Review of the radiographs of the right foot does not show any destructive bony changes.  Patient has air in the soft tissue up to the midfoot.  Patient has destructive periarticular cystic changes of the great toe MTP joint as well as through the midfoot consistent with a chronic history of gout.  Patient's sed rate is 105 CRP 24.4.  Hemoglobin 8.6 with a white blood cell count of 18.6.  His albumin is 2.3 with an albumin of 5.1.  Assessment: Assessment: Diabetic insensate neuropathy with poorly controlled diabetes with a presumed  chronic history of gout with abscess and ulceration right fifth metatarsal head.  Plan: Discussed with the patient we will plan for surgical intervention tomorrow, Monday.  Will plan for a minimum of the fifth ray amputation.  Anticipate there will be infection that stands across the dorsum of his foot and would plan for a installation wound VAC for several days for an attempted foot salvage intervention.  Possibly return to the operating room on Wednesday.  I will order a uric acid.  Thank you for the consult and the opportunity to see Mr. Glore  Izabel Chim, MD [facility] [phone] 9:11 AM     

## 2020-06-05 NOTE — Progress Notes (Signed)
VASCULAR LAB    ABIs have been performed.  See CV proc for preliminary results.   Kamerin Grumbine, RVT 06/05/2020, 9:56 AM

## 2020-06-05 NOTE — Progress Notes (Signed)
PROGRESS NOTE    Patrick [name]  [id number] DOB: [birth year] DOA: 06/04/2020 PCP: [facility]   Brief Narrative:  HPI: Patrick [name] is a 55 y.o. male with medical history significant of DM2, HTN.  Pt with wound to R lateral foot for 2 weeks now.  Had "blister" on side of foot that popped today.  Today has fever, and presents to ED for evaluation.  Has minimal to no pain in foot even with bad looking wound, cant feel much at all in feet (due to diabetic neuropathy).  Has not been on insulins at all recently due to cost and no insurance until recently.  Has had cough, non-productive, this has been ongoing and unchanged since he had COVID-19 just over a year ago (March 21).  He has had COVID vaccines following his COVID-19.  No abd pain, no N/V.  No wound care history, not seen foot specialist, not ever had surgery on foot.   ED Course: Pt is septic: Tm 103.1, HR 122, RR 30, WBC 19.4k.  Lactate nl.  HGB 9.5.  Sodium 129, BUN 28, creat 2.1 (up from 1.7 on discharge from his [place] admission in March 21).  CXR neg.  COVID pending.  Got cefepime / flagyl / vanc  Got sepsis fluid bolus.   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)   Diabetic foot ulcer ([place])   Diabetic foot infection ([place])  Sepsis secondary to right foot diabetic ulcer/cellulitis: Concern for gas gangrene.  Has foul-smelling discharge from right foot.  Dressing in place, dressing not removed, direct visualization defer to orthopedics.  He is scheduled for ABI today.  Low-grade fever again today.  Leukocytosis persists.  Normal lactic acid.  Continue current antibiotics and defer to orthopedics for definitive therapy.  AKI on CKD stage IIIa: Baseline  creatinine around 1.5.  Presented with 2.14.  Improving.  Will start on normal saline at 125 cc/h.  Avoid nephrotoxic agents.  Hold metformin, hydrochlorothiazide and losartan.  Essential hypertension: Blood pressure on the low normal side.  Continue to hold amlodipine and other antihypertensives.  Uncontrolled type 2 diabetes mellitus with hyperglycemia: Takes Metformin, NovoLog 10 units 3 times daily Premeal and Levemir 60 units in the morning and 28 units at night.  He has been started on Levemir only 20 units twice daily.  Blood sugar significantly elevated in 300s range.  Hemoglobin A1c pending.  Will increase Levemir to 30 units twice daily, start him on NovoLog 8 units 3 times daily Premeal and continue SSI.  DVT prophylaxis: SCDs Start: 06/04/20 1933   Code Status: Full Code  Family Communication: None present at bedside.  Plan of care discussed with patient in length and he verbalized understanding and agreed with it.  Status is: Inpatient  Remains inpatient appropriate because:Inpatient level of care appropriate due to severity of illness   Dispo: The patient is from: Home              Anticipated d/c is to: Home              Patient currently is not medically stable to d/c.   Difficult to place patient No        Estimated body mass index is 43.59 kg/m as calculated from the following:  Height as of this encounter: 6' (1.829 m).   Weight as of this encounter: 145.8 kg.      Nutritional status:               Consultants:   Orthopedics  Procedures:   None  Antimicrobials:  Anti-infectives (From admission, onward)   Start     Dose/Rate Route Frequency Ordered Stop   06/05/20 2000  vancomycin (VANCOREADY) IVPB 1500 mg/300 mL        1,500 mg 150 mL/hr over 120 Minutes Intravenous Every 24 hours 06/04/20 1736     06/05/20 0200  piperacillin-tazobactam (ZOSYN) IVPB 3.375 g        3.375 g 12.5 mL/hr over 240 Minutes Intravenous Every 8 hours 06/04/20  2025     06/04/20 2200  ceFEPIme (MAXIPIME) 2 g in sodium chloride 0.9 % 100 mL IVPB  Status:  Discontinued        2 g 200 mL/hr over 30 Minutes Intravenous Every 12 hours 06/04/20 1736 06/04/20 1935   06/04/20 2015  clindamycin (CLEOCIN) IVPB 600 mg        600 mg 100 mL/hr over 30 Minutes Intravenous Every 8 hours 06/04/20 1935 06/07/20 2014   06/04/20 1730  vancomycin (VANCOREADY) IVPB 2000 mg/400 mL        2,000 mg 200 mL/hr over 120 Minutes Intravenous  Once 06/04/20 1717 06/04/20 2237   06/04/20 1715  ceFEPIme (MAXIPIME) 2 g in sodium chloride 0.9 % 100 mL IVPB        2 g 200 mL/hr over 30 Minutes Intravenous  Once 06/04/20 1712 06/04/20 1836   06/04/20 1715  metroNIDAZOLE (FLAGYL) IVPB 500 mg  Status:  Discontinued        500 mg 100 mL/hr over 60 Minutes Intravenous  Once 06/04/20 1712 06/04/20 1946   06/04/20 1715  vancomycin (VANCOREADY) IVPB 1000 mg/200 mL  Status:  Discontinued        1,000 mg 200 mL/hr over 60 Minutes Intravenous  Once 06/04/20 1712 06/04/20 1717         Subjective: Seen and examined.  Feels weak.  No other complaint.  Denies any pain in the foot.  Objective: Vitals:   06/04/20 2045 06/04/20 2218 06/04/20 2358 06/05/20 0419  BP: 121/90 (!) 147/86    Pulse: (!) 111 (!) 103    Resp: (!) 24 (!) 25    Temp:   (!) 100.5 F (38.1 C) 99.5 F (37.5 C)  TempSrc:   Oral Oral  SpO2: 100%  99%   Weight:      Height:        Intake/Output Summary (Last 24 hours) at 06/05/2020 0753 Last data filed at 06/05/2020 0502 Gross per 24 hour  Intake 195.33 ml  Output 900 ml  Net -704.67 ml   Filed Weights   06/04/20 1714  Weight: (!) 145.8 kg    Examination:  General exam: Appears calm and comfortable, morbidly obese Respiratory system: Clear to auscultation. Respiratory effort normal. Cardiovascular system: S1 & S2 heard, RRR. No JVD, murmurs, rubs, gallops or clicks. No pedal edema. Gastrointestinal system: Abdomen is nondistended, soft and nontender.  No organomegaly or masses felt. Normal bowel sounds heard. Central nervous system: Alert and oriented. No focal neurological deficits. Extremities: Symmetric 5 x 5 power. Skin: Dressing in place in the right foot.  Foul-smelling discharge. Psychiatry: Judgement and insight appear normal. Mood & affect appropriate.    Data Reviewed: I have personally reviewed following labs and imaging studies  CBC: Recent Labs  Lab 06/04/20 1626 06/05/20 0557  WBC 19.4* 18.6*  NEUTROABS 16.6*  --   HGB 9.5* 8.6*  HCT 30.5* 27.0*  MCV 86.2 84.9  PLT 439* [id number]*   Basic Metabolic Panel: Recent Labs  Lab 06/04/20 1626 06/05/20 0557  NA 129* 134*  K 4.4 3.8  CL 98 103  CO2 20* 23  GLUCOSE 393* 231*  BUN 28* 26*  CREATININE 2.14* 1.98*  CALCIUM 8.6* 8.5*   GFR: Estimated Creatinine Clearance: 64 mL/min (A) (by C-G formula based on SCr of 1.98 mg/dL (H)). Liver Function Tests: Recent Labs  Lab 06/04/20 1626 06/05/20 0557  AST 25 25  ALT 19 20  ALKPHOS 123 113  BILITOT 0.9 0.8  PROT 7.1 6.1*  ALBUMIN 2.7* 2.3*   No results for input(s): LIPASE, AMYLASE in the last 168 hours. No results for input(s): AMMONIA in the last 168 hours. Coagulation Profile: Recent Labs  Lab 06/04/20 1756  INR 1.2   Cardiac Enzymes: No results for input(s): CKTOTAL, CKMB, CKMBINDEX, TROPONINI in the last 168 hours. BNP (last 3 results) No results for input(s): PROBNP in the last 8760 hours. HbA1C: No results for input(s): HGBA1C in the last 72 hours. CBG: Recent Labs  Lab 06/04/20 2219 06/04/20 2357 06/05/20 0418  GLUCAP 307* 300* 209*   Lipid Profile: No results for input(s): CHOL, HDL, LDLCALC, TRIG, CHOLHDL, LDLDIRECT in the last 72 hours. Thyroid Function Tests: No results for input(s): TSH, T4TOTAL, FREET4, T3FREE, THYROIDAB in the last 72 hours. Anemia Panel: No results for input(s): VITAMINB12, FOLATE, FERRITIN, TIBC, IRON, RETICCTPCT in the last 72 hours. Sepsis Labs: Recent Labs   Lab 06/04/20 1626  LATICACIDVEN 1.5    Recent Results (from the past 240 hour(s))  SARS CORONAVIRUS 2 (TAT 6-24 HRS) Nasopharyngeal Nasopharyngeal Swab     Status: None   Collection Time: 06/04/20  4:26 PM   Specimen: Nasopharyngeal Swab  Result Value Ref Range Status   SARS Coronavirus 2 NEGATIVE NEGATIVE Final    Comment: (NOTE) SARS-CoV-2 target nucleic acids are NOT DETECTED.  The SARS-CoV-2 RNA is generally detectable in upper and lower respiratory specimens during the acute phase of infection. Negative results do not preclude SARS-CoV-2 infection, do not rule out co-infections with other pathogens, and should not be used as the sole basis for treatment or other patient management decisions. Negative results must be combined with clinical observations, patient history, and epidemiological information. The expected result is Negative.  Fact Sheet for Patients: SugarRoll.be  Fact Sheet for Healthcare Providers: https://www.woods-mathews.com/  This test is not yet approved or cleared by the [place] FDA and  has been authorized for detection and/or diagnosis of SARS-CoV-2 by FDA under an Emergency Use Authorization (EUA). This EUA will remain  in effect (meaning this test can be used) for the duration of the COVID-19 declaration under Se ction 564(b)(1) of the Act, 21 U.S.C. section 360bbb-3(b)(1), unless the authorization is terminated or revoked sooner.  Performed at [facility], [place] [address] [place], [place] [address]       Radiology Studies: DG Chest Port 1 View  Result Date: 06/04/2020 CLINICAL DATA:  Wound check EXAM: PORTABLE CHEST 1 VIEW COMPARISON:  05/02/2019 FINDINGS: Cardiac shadow is within normal limits. The lungs are clear bilaterally. No bony abnormality is noted. IMPRESSION: No acute abnormality seen. Electronically Signed   By: Inez Catalina M.D.   On: 06/04/2020 17:42   DG Foot 2 Views  Right  Result Date:  06/04/2020 CLINICAL DATA:  Foot pain, known soft tissue wound EXAM: RIGHT FOOT - 2 VIEW COMPARISON:  None. FINDINGS: Considerable subcutaneous air is noted about the distal aspect of the fifth metatarsal and fifth digit and to a lesser degree about the fourth digit. Degenerative changes of the first MTP joint, tarsal bones and calcaneus are noted. No definitive erosive changes are noted at this time. IMPRESSION: Considerable subcutaneous air consistent with the known history of wound. No bony erosive changes are seen. MRI would be more sensitive for underlying osteomyelitis in this regard. Electronically Signed   By: Inez Catalina M.D.   On: 06/04/2020 17:42    Scheduled Meds: . insulin aspart  0-15 Units Subcutaneous Q4H  . insulin aspart  10 Units Intravenous Once  . insulin detemir  20 Units Subcutaneous BID   Continuous Infusions: . clindamycin (CLEOCIN) IV 600 mg (06/05/20 0502)  . lactated ringers 150 mL/hr at 06/05/20 0739  . piperacillin-tazobactam (ZOSYN)  IV 3.375 g (06/05/20 0136)  . vancomycin       LOS: 1 day   Time spent: 35 minutes   Darliss Cheney, MD [facility]  06/05/2020, 7:53 AM   To contact the attending provider between 7A-7P or the covering provider during after hours 7P-7A, please log into the web site [email or web address].

## 2020-06-05 NOTE — Consult Note (Signed)
ORTHOPAEDIC CONSULTATION  REQUESTING PHYSICIAN: Darliss Cheney, MD  Chief Complaint: Abscess ulceration and purulent drainage right foot.  HPI: Patrick [name] is a 55 y.o. male who presents with history of uncontrolled type 2 diabetes who noticed increased ulceration abscess and odor right foot.  Past Medical History:  Diagnosis Date  . Diabetes mellitus   . Gout   . History of COVID-19 04/2019  . Hypertension    History reviewed. No pertinent surgical history. Social History   Socioeconomic History  . Marital status: Married    Spouse name: Not on file  . Number of children: Not on file  . Years of education: Not on file  . Highest education level: Not on file  Occupational History  . Not on file  Tobacco Use  . Smoking status: Never Smoker  . Smokeless tobacco: Never Used  Substance and Sexual Activity  . Alcohol use: No  . Drug use: No  . Sexual activity: Not on file  Other Topics Concern  . Not on file  Social History Narrative  . Not on file   Social Determinants of Health   Financial Resource Strain: Not on file  Food Insecurity: Not on file  Transportation Needs: Not on file  Physical Activity: Not on file  Stress: Not on file  Social Connections: Not on file   Family History  Problem Relation Age of Onset  . Diabetes Neg Hx    - negative except otherwise stated in the family history section No Known Allergies Prior to Admission medications   Medication Sig Start Date End Date Taking? Authorizing Provider  gabapentin (NEURONTIN) 100 MG capsule Take 100 mg by mouth every evening.   Yes [provider]  losartan-hydrochlorothiazide (HYZAAR) 100-25 MG tablet Take 1 tablet by mouth daily.   Yes [provider]  metFORMIN (GLUCOPHAGE) 500 MG tablet Take 1 tablet (500 mg total) by mouth 2 (two) times daily with a meal. 05/07/19  Yes Ghimire, Henreitta Leber, MD  tiZANidine (ZANAFLEX) 4 MG tablet Take 4 mg by mouth at bedtime. 04/17/19   Yes [provider]  amLODipine (NORVASC) 10 MG tablet Take 1 tablet (10 mg total) by mouth daily. Patient not taking: Reported on 06/04/2020 05/08/19   Jonetta Osgood, MD  blood glucose meter kit and supplies Dispense based on patient and insurance preference. Use up to four times daily as directed. (FOR ICD-10 E10.9, E11.9). 05/07/19   Ghimire, Henreitta Leber, MD  insulin aspart (NOVOLOG) 100 UNIT/ML FlexPen Inject 10 Units into the skin 3 (three) times daily with meals. Patient not taking: Reported on 06/04/2020 05/07/19   Jonetta Osgood, MD  insulin detemir (LEVEMIR) 100 UNIT/ML FlexPen 60 units subcutaneously in a.m., and 28 units in p.m. Patient not taking: Reported on 06/04/2020 05/07/19   Jonetta Osgood, MD  Insulin Pen Needle 32G X 8 MM MISC Use as directed 05/07/19   Jonetta Osgood, MD   DG Chest Port 1 View  Result Date: 06/04/2020 CLINICAL DATA:  Wound check EXAM: PORTABLE CHEST 1 VIEW COMPARISON:  05/02/2019 FINDINGS: Cardiac shadow is within normal limits. The lungs are clear bilaterally. No bony abnormality is noted. IMPRESSION: No acute abnormality seen. Electronically Signed   By: Inez Catalina M.D.   On: 06/04/2020 17:42   DG Foot 2 Views Right  Result Date: 06/04/2020 CLINICAL DATA:  Foot pain, known soft tissue wound EXAM: RIGHT FOOT - 2 VIEW COMPARISON:  None. FINDINGS: Considerable subcutaneous air is noted about  the distal aspect of the fifth metatarsal and fifth digit and to a lesser degree about the fourth digit. Degenerative changes of the first MTP joint, tarsal bones and calcaneus are noted. No definitive erosive changes are noted at this time. IMPRESSION: Considerable subcutaneous air consistent with the known history of wound. No bony erosive changes are seen. MRI would be more sensitive for underlying osteomyelitis in this regard. Electronically Signed   By: Inez Catalina M.D.   On: 06/04/2020 17:42   - pertinent xrays, CT, MRI studies were reviewed and  independently interpreted  Positive ROS: All other systems have been reviewed and were otherwise negative with the exception of those mentioned in the HPI and as above.  Physical Exam: General: Alert, no acute distress Psychiatric: Patient is competent for consent with normal mood and affect Lymphatic: No axillary or cervical lymphadenopathy Cardiovascular: No pedal edema Respiratory: No cyanosis, no use of accessory musculature GI: No organomegaly, abdomen is soft and non-tender    Images:  _0 @  Labs:  Lab Results  Component Value Date   HGBA1C 12.4 (H) 05/03/2019   ESRSEDRATE 105 (H) 06/04/2020   CRP 24.4 (H) 06/04/2020   CRP 1.2 (H) 05/07/2019   CRP 1.8 (H) 05/06/2019    Lab Results  Component Value Date   ALBUMIN 2.3 (L) 06/05/2020   ALBUMIN 2.7 (L) 06/04/2020   ALBUMIN 2.7 (L) 05/07/2019   PREALBUMIN 5.1 (L) 06/04/2020    No results found for: CBC  Neurologic: Patient does not have protective sensation bilateral lower extremities.   MUSCULOSKELETAL:   Skin: Examination patient has cellulitis and necrotic ulceration around the fifth metatarsal head.  Patient has an ulcer that probes to bone.  There is foul-smelling odor and drainage.  Patient does not have a palpable pulse due to swelling however patient's Doppler shows biphasic flow with an ankle-brachial indices around 1.1.  Radiographs do not show any calcified vessels.  Review of the radiographs of the right foot does not show any destructive bony changes.  Patient has air in the soft tissue up to the midfoot.  Patient has destructive periarticular cystic changes of the great toe MTP joint as well as through the midfoot consistent with a chronic history of gout.  Patient's sed rate is 105 CRP 24.4.  Hemoglobin 8.6 with a white blood cell count of 18.6.  His albumin is 2.3 with an albumin of 5.1.  Assessment: Assessment: Diabetic insensate neuropathy with poorly controlled diabetes with a presumed  chronic history of gout with abscess and ulceration right fifth metatarsal head.  Plan: Discussed with the patient we will plan for surgical intervention tomorrow, Monday.  Will plan for a minimum of the fifth ray amputation.  Anticipate there will be infection that stands across the dorsum of his foot and would plan for a installation wound VAC for several days for an attempted foot salvage intervention.  Possibly return to the operating room on Wednesday.  I will order a uric acid.  Thank you for the consult and the opportunity to see Patrick [name], [place] [phone] 9:11 AM

## 2020-06-06 ENCOUNTER — Inpatient Hospital Stay (HOSPITAL_COMMUNITY): Payer: 59 | Admitting: Certified Registered Nurse Anesthetist

## 2020-06-06 ENCOUNTER — Encounter (HOSPITAL_COMMUNITY)
Admission: EM | Disposition: A | Discharge status: Home Health | Payer: Self-pay | Source: Home / Self Care | Attending: Family Medicine

## 2020-06-06 ENCOUNTER — Encounter (HOSPITAL_COMMUNITY): Payer: Self-pay | Admitting: Internal Medicine

## 2020-06-06 DIAGNOSIS — M869 Osteomyelitis, unspecified: Secondary | ICD-10-CM

## 2020-06-06 DIAGNOSIS — L02611 Cutaneous abscess of right foot: Secondary | ICD-10-CM

## 2020-06-06 DIAGNOSIS — E43 Unspecified severe protein-calorie malnutrition: Secondary | ICD-10-CM | POA: Diagnosis not present

## 2020-06-06 DIAGNOSIS — A480 Gas gangrene: Secondary | ICD-10-CM | POA: Diagnosis not present

## 2020-06-06 DIAGNOSIS — E11621 Type 2 diabetes mellitus with foot ulcer: Secondary | ICD-10-CM | POA: Diagnosis not present

## 2020-06-06 DIAGNOSIS — E11628 Type 2 diabetes mellitus with other skin complications: Secondary | ICD-10-CM | POA: Diagnosis not present

## 2020-06-06 HISTORY — PX: AMPUTATION: SHX166

## 2020-06-06 LAB — COMPREHENSIVE METABOLIC PANEL
ALT: 19 U/L (ref 0–44)
AST: 17 U/L (ref 15–41)
Albumin: 2.3 g/dL — ABNORMAL LOW (ref 3.5–5.0)
Alkaline Phosphatase: 116 U/L (ref 38–126)
Anion gap: 10 (ref 5–15)
BUN: 25 mg/dL — ABNORMAL HIGH (ref 6–20)
CO2: 20 mmol/L — ABNORMAL LOW (ref 22–32)
Calcium: 8.2 mg/dL — ABNORMAL LOW (ref 8.9–10.3)
Chloride: 101 mmol/L (ref 98–111)
Creatinine, Ser: 2.33 mg/dL — ABNORMAL HIGH (ref 0.61–1.24)
GFR, Estimated: 33 mL/min — ABNORMAL LOW (ref >60)
Glucose, Bld: 223 mg/dL — ABNORMAL HIGH (ref 70–99)
Potassium: 3.9 mmol/L (ref 3.5–5.1)
Sodium: 131 mmol/L — ABNORMAL LOW (ref 135–145)
Total Bilirubin: 0.9 mg/dL (ref 0.3–1.2)
Total Protein: 6.4 g/dL — ABNORMAL LOW (ref 6.5–8.1)

## 2020-06-06 LAB — CBC WITH DIFFERENTIAL/PLATELET
Abs Immature Granulocytes: 0.15 10*3/uL — ABNORMAL HIGH (ref 0.00–0.07)
Basophils Absolute: 0.1 10*3/uL (ref 0.0–0.1)
Basophils Relative: 0 %
Eosinophils Absolute: 0.2 10*3/uL (ref 0.0–0.5)
Eosinophils Relative: 1 %
HCT: 26.7 % — ABNORMAL LOW (ref 39.0–52.0)
Hemoglobin: 8.3 g/dL — ABNORMAL LOW (ref 13.0–17.0)
Immature Granulocytes: 1 %
Lymphocytes Relative: 12 %
Lymphs Abs: 1.9 10*3/uL (ref 0.7–4.0)
MCH: 26.5 pg (ref 26.0–34.0)
MCHC: 31.1 g/dL (ref 30.0–36.0)
MCV: 85.3 fL (ref 80.0–100.0)
Monocytes Absolute: 1.3 10*3/uL — ABNORMAL HIGH (ref 0.1–1.0)
Monocytes Relative: 8 %
Neutro Abs: 13.1 10*3/uL — ABNORMAL HIGH (ref 1.7–7.7)
Neutrophils Relative %: 78 %
Platelets: 398 10*3/uL (ref 150–400)
RBC: 3.13 MIL/uL — ABNORMAL LOW (ref 4.22–5.81)
RDW: 13.5 % (ref 11.5–15.5)
WBC: 16.7 10*3/uL — ABNORMAL HIGH (ref 4.0–10.5)
nRBC: 0 % (ref 0.0–0.2)

## 2020-06-06 LAB — GLUCOSE, CAPILLARY
Glucose-Capillary: 186 mg/dL — ABNORMAL HIGH (ref 70–99)
Glucose-Capillary: 163 mg/dL — ABNORMAL HIGH (ref 70–99)
Glucose-Capillary: 146 mg/dL — ABNORMAL HIGH (ref 70–99)

## 2020-06-06 LAB — URINE CULTURE

## 2020-06-06 LAB — MAGNESIUM: Magnesium: 1.3 mg/dL — ABNORMAL LOW (ref 1.7–2.4)

## 2020-06-06 LAB — HEMOGLOBIN A1C
Hgb A1c MFr Bld: >15.5 % — ABNORMAL HIGH (ref 4.8–5.6)
Mean Plasma Glucose: >398 mg/dL

## 2020-06-06 SURGERY — AMPUTATION, FOOT, RAY
Anesthesia: Monitor Anesthesia Care | Site: Foot | Laterality: Right

## 2020-06-06 MED ORDER — ORAL CARE MOUTH RINSE
15.0000 mL | Freq: Once | OROMUCOSAL | Status: AC
Start: 2020-06-06 — End: 2020-06-06

## 2020-06-06 MED ORDER — OXYCODONE HCL 5 MG PO TABS: 10.0000 mg | ORAL_TABLET | Freq: Four times a day (QID) | ORAL | Status: DC | PRN

## 2020-06-06 MED ORDER — MAGNESIUM SULFATE 4 GM/100ML IV SOLN
4.0000 g | Freq: Once | INTRAVENOUS | Status: AC
  Administered 2020-06-06: 4 g via INTRAVENOUS
  Filled 2020-06-06: qty 100

## 2020-06-06 MED ORDER — CHLORHEXIDINE GLUCONATE 0.12 % MT SOLN
OROMUCOSAL | Status: AC
  Administered 2020-06-06: 15 mL via OROMUCOSAL
  Filled 2020-06-06: qty 15

## 2020-06-06 MED ORDER — PROPOFOL 500 MG/50ML IV EMUL
INTRAVENOUS | Status: DC | PRN
  Administered 2020-06-06: 50 ug/kg/min via INTRAVENOUS

## 2020-06-06 MED ORDER — ROPIVACAINE HCL 5 MG/ML IJ SOLN
INTRAMUSCULAR | Status: DC | PRN
  Administered 2020-06-06: 30 mL via EPIDURAL

## 2020-06-06 MED ORDER — COLCHICINE 0.6 MG PO TABS
0.6000 mg | ORAL_TABLET | Freq: Two times a day (BID) | ORAL | Status: DC
  Administered 2020-06-06 – 2020-06-10 (×8): 0.6 mg via ORAL
  Filled 2020-06-06 (×9): qty 1

## 2020-06-06 MED ORDER — CHLORHEXIDINE GLUCONATE 0.12 % MT SOLN
15.0000 mL | Freq: Once | OROMUCOSAL | Status: AC
Start: 2020-06-06 — End: 2020-06-06

## 2020-06-06 MED ORDER — FENTANYL CITRATE (PF) 100 MCG/2ML IJ SOLN: 50.0000 ug | Freq: Once | INTRAMUSCULAR | Status: DC

## 2020-06-06 MED ORDER — MIDAZOLAM HCL 2 MG/2ML IJ SOLN: 2.0000 mg | Freq: Once | INTRAMUSCULAR | Status: AC

## 2020-06-06 MED ORDER — HYDROMORPHONE HCL 1 MG/ML IJ SOLN
0.5000 mg | INTRAMUSCULAR | Status: DC | PRN
  Administered 2020-06-06 (×2): 0.5 mg via INTRAVENOUS
  Filled 2020-06-06 (×2): qty 1

## 2020-06-06 MED ORDER — FENTANYL CITRATE (PF) 100 MCG/2ML IJ SOLN
100.0000 ug | Freq: Once | INTRAMUSCULAR | Status: AC
Start: 2020-06-06 — End: 2020-06-06

## 2020-06-06 MED ORDER — SODIUM CHLORIDE 0.9 % IV SOLN: INTRAVENOUS | Status: DC

## 2020-06-06 MED ORDER — ACETAMINOPHEN 10 MG/ML IV SOLN
INTRAVENOUS | Status: AC
  Filled 2020-06-06: qty 100

## 2020-06-06 MED ORDER — ACETAMINOPHEN 10 MG/ML IV SOLN
1000.0000 mg | Freq: Once | INTRAVENOUS | Status: AC
  Administered 2020-06-06: 1000 mg via INTRAVENOUS

## 2020-06-06 MED ORDER — ADULT MULTIVITAMIN W/MINERALS CH
1.0000 | ORAL_TABLET | Freq: Every day | ORAL | Status: DC
  Administered 2020-06-06 – 2020-06-10 (×5): 1 via ORAL
  Filled 2020-06-06 (×5): qty 1

## 2020-06-06 MED ORDER — MIDAZOLAM HCL 2 MG/2ML IJ SOLN
INTRAMUSCULAR | Status: AC
  Administered 2020-06-06: 2 mg via INTRAVENOUS
  Filled 2020-06-06: qty 2

## 2020-06-06 MED ORDER — FENTANYL CITRATE (PF) 100 MCG/2ML IJ SOLN
INTRAMUSCULAR | Status: AC
  Administered 2020-06-06: 100 ug via INTRAVENOUS
  Filled 2020-06-06: qty 2

## 2020-06-06 MED ORDER — FENTANYL CITRATE (PF) 100 MCG/2ML IJ SOLN: 25.0000 ug | INTRAMUSCULAR | Status: DC | PRN

## 2020-06-06 MED ORDER — MIDAZOLAM HCL 2 MG/2ML IJ SOLN: 1.0000 mg | Freq: Once | INTRAMUSCULAR | Status: DC

## 2020-06-06 MED ORDER — PROSOURCE PLUS PO LIQD
30.0000 mL | Freq: Two times a day (BID) | ORAL | Status: DC
  Administered 2020-06-07 – 2020-06-10 (×7): 30 mL via ORAL
  Filled 2020-06-06 (×6): qty 30

## 2020-06-06 MED ORDER — ENSURE MAX PROTEIN PO LIQD
11.0000 [oz_av] | Freq: Two times a day (BID) | ORAL | Status: DC
  Administered 2020-06-06: 11 [oz_av] via ORAL
  Filled 2020-06-06 (×2): qty 330

## 2020-06-06 MED ORDER — DEXAMETHASONE SODIUM PHOSPHATE 10 MG/ML IJ SOLN
INTRAMUSCULAR | Status: DC | PRN
  Administered 2020-06-06: 8 mg

## 2020-06-06 MED ORDER — FENTANYL CITRATE (PF) 250 MCG/5ML IJ SOLN
INTRAMUSCULAR | Status: AC
  Filled 2020-06-06: qty 5

## 2020-06-06 MED ORDER — PROPOFOL 10 MG/ML IV BOLUS
INTRAVENOUS | Status: DC | PRN
  Administered 2020-06-06: 30 mg via INTRAVENOUS

## 2020-06-06 SURGICAL SUPPLY — 34 items
BLADE SAW SGTL MED 73X18.5 STR (BLADE) IMPLANT
BLADE SURG 21 STRL SS (BLADE) ×2 IMPLANT
BNDG COHESIVE 4X5 TAN STRL (GAUZE/BANDAGES/DRESSINGS) ×2 IMPLANT
BNDG GAUZE ELAST 4 BULKY (GAUZE/BANDAGES/DRESSINGS) ×2 IMPLANT
CANISTER WOUNDNEG PRESSURE 500 (CANNISTER) ×1 IMPLANT
CASSETTE VERAFLO VERALINK (MISCELLANEOUS) ×1 IMPLANT
COVER SURGICAL LIGHT HANDLE (MISCELLANEOUS) ×4 IMPLANT
COVER WAND RF STERILE (DRAPES) ×2 IMPLANT
DRAPE DERMATAC (DRAPES) ×1 IMPLANT
DRAPE INCISE IOBAN 66X45 STRL (DRAPES) ×1 IMPLANT
DRAPE U-SHAPE 47X51 STRL (DRAPES) ×4 IMPLANT
DRESSING VERAFLO CLEANSE CC (GAUZE/BANDAGES/DRESSINGS) IMPLANT
DRSG ADAPTIC 3X8 NADH LF (GAUZE/BANDAGES/DRESSINGS) ×2 IMPLANT
DRSG PAD ABDOMINAL 8X10 ST (GAUZE/BANDAGES/DRESSINGS) ×4 IMPLANT
DRSG VERAFLO CLEANSE CC (GAUZE/BANDAGES/DRESSINGS) ×2
DURAPREP 26ML APPLICATOR (WOUND CARE) ×2 IMPLANT
ELECT REM PT RETURN 9FT ADLT (ELECTROSURGICAL) ×2
ELECTRODE REM PT RTRN 9FT ADLT (ELECTROSURGICAL) ×1 IMPLANT
GAUZE SPONGE 4X4 12PLY STRL (GAUZE/BANDAGES/DRESSINGS) ×2 IMPLANT
GLOVE BIOGEL PI IND STRL 9 (GLOVE) ×1 IMPLANT
GLOVE BIOGEL PI INDICATOR 9 (GLOVE) ×1
GLOVE SURG ORTHO 9.0 STRL STRW (GLOVE) ×2 IMPLANT
GOWN STRL REUS W/ TWL XL LVL3 (GOWN DISPOSABLE) ×2 IMPLANT
GOWN STRL REUS W/TWL XL LVL3 (GOWN DISPOSABLE) ×4
KIT BASIN OR (CUSTOM PROCEDURE TRAY) ×2 IMPLANT
KIT TURNOVER KIT B (KITS) ×2 IMPLANT
NS IRRIG 1000ML POUR BTL (IV SOLUTION) ×2 IMPLANT
PACK ORTHO EXTREMITY (CUSTOM PROCEDURE TRAY) ×2 IMPLANT
PAD ARMBOARD 7.5X6 YLW CONV (MISCELLANEOUS) ×4 IMPLANT
STOCKINETTE IMPERVIOUS LG (DRAPES) IMPLANT
SUT ETHILON 2 0 PSLX (SUTURE) ×3 IMPLANT
TOWEL GREEN STERILE (TOWEL DISPOSABLE) ×2 IMPLANT
TUBE CONNECTING 12X1/4 (SUCTIONS) ×2 IMPLANT
YANKAUER SUCT BULB TIP NO VENT (SUCTIONS) ×2 IMPLANT

## 2020-06-06 NOTE — Progress Notes (Signed)
Inpatient Diabetes Program Recommendations  AACE/ADA: New Consensus Statement on Inpatient Glycemic Control (2015)  Target Ranges:  Prepandial:   less than 140 mg/dL      Peak postprandial:   less than 180 mg/dL (1-2 hours)      Critically ill patients:  140 - 180 mg/dL   Lab Results  Component Value Date   GLUCAP 186 (H) 06/06/2020   HGBA1C 12.4 (H) 05/03/2019    Review of Glycemic Control Results for PURCELL, RHODD (MRN [id number]) as of 06/06/2020 10:47  Ref. Range 06/05/2020 08:11 06/05/2020 11:57 06/05/2020 16:02 06/05/2020 21:06 06/06/2020 08:05  Glucose-Capillary Latest Ref Range: 70 - 99 mg/dL 209 (H) 199 (H) 282 (H) 264 (H) 186 (H)   Diabetes history: DM2 Outpatient Diabetes medications: Metformin 1 gm bid (Not taking Levemir 60 units am + 28 units pm + Novolog 10 units tid meal coverage) Current orders for Inpatient glycemic control: Levemir 30 units bid + Novolog 8 units tid + Novolog 0-15 units tid + 0-5 units hs  Inpatient Diabetes Program Recommendations:   Noted patient NPO for surgery today. Consult ordered for transitions of care regarding medication benefit costs.  A1c pending. Will follow and review cost of Novolin Relion 70/30 (approximately $42 for 5 pens-1500 units) insulin option compared to insulin costs with insurance.  Thank you, Nani Gasser. Soren Pigman, RN, MSN, CDE  Diabetes Coordinator Inpatient Glycemic Control Team Team Pager [phone] (8am-5pm) 06/06/2020 11:03 AM

## 2020-06-06 NOTE — Progress Notes (Signed)
PROGRESS NOTE    Patrick [name]  [id number] DOB: 1965-12-03 DOA: 06/04/2020 PCP: [facility]   Brief Narrative:  Patrick [name] is a 55 y.o. male with medical history significant of DM2, HTN.  Who presented to ED with wound to the right lateral foot for 2 weeks.  No fever.  Minimal pain. Has not been on insulins at all recently due to cost and no insurance until recently.  Upon arrival to ED, patient was septic with Tm 103.1, HR 122, RR 30, WBC 19.4k.  Lactic acid were normal.  Chest x-ray normal.  Covid negative.  He was admitted to hospital service and was started on cefepime Flagyl as well as vancomycin and orthopedics were consulted.  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  Status post ABI yesterday.  Seen by Dr. Sharol Given.  Scheduled for amputation today.  Continue current antibiotics in the meantime.  AKI on CKD stage IIIa: Baseline creatinine around 1.5-1.7.  Presented with 2.14.  Which has been stable.  Continue IV fluids as he is n.p.o. now.  Avoid nephrotoxic agents.  Continue to hold metformin, hydrochlorothiazide and losartan.  Essential hypertension: Blood pressure controlled.  Continue to hold amlodipine and other antihypertensives.  Gout with multiple joints affected: Complains of left elbow, right ring finger and left knee pain.  All of these joints are edematous and tender to palpation and warm as well.  Consistent with clinical findings of gout.  He is currently n.p.o. and is all set for surgery.  Orthopedics already on board.  Will defer to them if any of those  joints needs to be tapped, especially his left elbow which seems to be swollen with some joint effusion.  We will start him on Dilaudid IV as needed for pain control.  Uncontrolled type 2 diabetes mellitus with hyperglycemia: Blood sugar still elevated but slightly better.  Continue Lantus 30 units twice daily for now as he has been n.p.o.  Continue SSI  DVT prophylaxis: SCDs Start: 06/04/20 1933   Code Status: Full Code  Family Communication: None present at bedside.  Plan of care discussed with patient in length and he verbalized understanding and agreed with it.  Status is: Inpatient  Remains inpatient appropriate because:Inpatient level of care appropriate due to severity of illness   Dispo: The patient is from: Home              Anticipated d/c is to: Home              Patient currently is not medically stable to d/c.   Difficult to place patient No    Estimated body mass index is 43.59 kg/m as calculated from the following:   Height as of this encounter: 6' (1.829 m).   Weight as of this encounter: 145.8 kg.      Nutritional status:               Consultants:   Orthopedics  Procedures:   None  Antimicrobials:  Anti-infectives (From admission, onward)   Start     Dose/Rate Route Frequency Ordered Stop   06/06/20 0600  ceFAZolin (ANCEF) IVPB 3g/100 mL premix        3 g 200 mL/hr over 30 Minutes Intravenous On call to O.R. 06/05/20 2230 06/07/20 0559   06/05/20 2000  vancomycin (VANCOREADY) IVPB 1500 mg/300 mL        1,500 mg 150 mL/hr over 120 Minutes Intravenous Every 24 hours 06/04/20 1736     06/05/20 0200  piperacillin-tazobactam (ZOSYN) IVPB 3.375 g        3.375 g 12.5 mL/hr over 240 Minutes Intravenous Every 8 hours 06/04/20 2025     06/04/20 2200  ceFEPIme (MAXIPIME) 2 g in sodium chloride 0.9 % 100 mL IVPB  Status:  Discontinued        2 g 200 mL/hr over 30 Minutes Intravenous Every 12 hours 06/04/20 1736 06/04/20 1935   06/04/20 2015   clindamycin (CLEOCIN) IVPB 600 mg        600 mg 100 mL/hr over 30 Minutes Intravenous Every 8 hours 06/04/20 1935 06/07/20 2014   06/04/20 1730  vancomycin (VANCOREADY) IVPB 2000 mg/400 mL        2,000 mg 200 mL/hr over 120 Minutes Intravenous  Once 06/04/20 1717 06/04/20 2237   06/04/20 1715  ceFEPIme (MAXIPIME) 2 g in sodium chloride 0.9 % 100 mL IVPB        2 g 200 mL/hr over 30 Minutes Intravenous  Once 06/04/20 1712 06/04/20 1836   06/04/20 1715  metroNIDAZOLE (FLAGYL) IVPB 500 mg  Status:  Discontinued        500 mg 100 mL/hr over 60 Minutes Intravenous  Once 06/04/20 1712 06/04/20 1946   06/04/20 1715  vancomycin (VANCOREADY) IVPB 1000 mg/200 mL  Status:  Discontinued        1,000 mg 200 mL/hr over 60 Minutes Intravenous  Once 06/04/20 1712 06/04/20 1717         Subjective: Patient seen and examined.  He complains of pain in the left elbow, right ring finger as well as left knee.  He tells me that he thought he had gout.  He has never been diagnosed with that though.  Objective: Vitals:   06/05/20 1946 06/05/20 2303 06/06/20 0307 06/06/20 0807  BP:  107/67 122/70 137/78  Pulse:  80 (!) 111 90  Resp: '18 20 20 17  '$ Temp:  98.8 F (37.1 C) 99.3 F (37.4 C) 97.8 F (36.6 C)  TempSrc:  Oral Oral Oral  SpO2:  100% 99% 100%  Weight:      Height:        Intake/Output Summary (Last 24 hours) at 06/06/2020 1205 Last data filed at 06/06/2020 [id number] Gross per 24 hour  Intake --  Output 500 ml  Net -500 ml   Filed Weights   06/04/20 1714  Weight: (!) 145.8 kg    Examination: General exam: Appears calm and comfortable, morbidly obese Respiratory system: Clear to auscultation. Respiratory effort normal. Cardiovascular system: S1 & S2 heard, RRR. No JVD, murmurs, rubs, gallops or clicks. No pedal edema. Gastrointestinal system: Abdomen is nondistended, soft and nontender. No organomegaly or masses felt. Normal bowel sounds heard. Central nervous system: Alert and oriented.  No focal neurological deficits. Extremities: Dressing in the right foot.  Edematous, tender and warm left elbow, right ring finger as well as left knee. Skin: No rashes, lesions or ulcers.  Psychiatry: Judgement and insight appear normal. Mood & affect appropriate.   Data Reviewed: I have personally reviewed following labs and imaging studies  CBC: Recent Labs  Lab 06/04/20 1626  06/05/20 0557 06/06/20 0353  WBC 19.4* 18.6* 16.7*  NEUTROABS 16.6*  --  13.1*  HGB 9.5* 8.6* 8.3*  HCT 30.5* 27.0* 26.7*  MCV 86.2 84.9 85.3  PLT 439* 417* [id number]   Basic Metabolic Panel: Recent Labs  Lab 06/04/20 1626 06/05/20 0557 06/06/20 0353  NA 129* 134* 131*  K 4.4 3.8 3.9  CL 98 103 101  CO2 20* 23 20*  GLUCOSE 393* 231* 223*  BUN 28* 26* 25*  CREATININE 2.14* 1.98* 2.33*  CALCIUM 8.6* 8.5* 8.2*  MG  --   --  1.3*   GFR: Estimated Creatinine Clearance: 54.4 mL/min (A) (by C-G formula based on SCr of 2.33 mg/dL (H)). Liver Function Tests: Recent Labs  Lab 06/04/20 1626 06/05/20 0557 06/06/20 0353  AST '25 25 17  '$ ALT '19 20 19  '$ ALKPHOS 123 113 116  BILITOT 0.9 0.8 0.9  PROT 7.1 6.1* 6.4*  ALBUMIN 2.7* 2.3* 2.3*   No results for input(s): LIPASE, AMYLASE in the last 168 hours. No results for input(s): AMMONIA in the last 168 hours. Coagulation Profile: Recent Labs  Lab 06/04/20 1756  INR 1.2   Cardiac Enzymes: No results for input(s): CKTOTAL, CKMB, CKMBINDEX, TROPONINI in the last 168 hours. BNP (last 3 results) No results for input(s): PROBNP in the last 8760 hours. HbA1C: Recent Labs    06/04/20 2044  HGBA1C >15.5*   CBG: Recent Labs  Lab 06/05/20 0811 06/05/20 1157 06/05/20 1602 06/05/20 2106 06/06/20 0805  GLUCAP 209* 199* 282* 264* 186*   Lipid Profile: No results for input(s): CHOL, HDL, LDLCALC, TRIG, CHOLHDL, LDLDIRECT in the last 72 hours. Thyroid Function Tests: No results for input(s): TSH, T4TOTAL, FREET4, T3FREE, THYROIDAB in the last 72  hours. Anemia Panel: No results for input(s): VITAMINB12, FOLATE, FERRITIN, TIBC, IRON, RETICCTPCT in the last 72 hours. Sepsis Labs: Recent Labs  Lab 06/04/20 1626  LATICACIDVEN 1.5    Recent Results (from the past 240 hour(s))  SARS CORONAVIRUS 2 (TAT 6-24 HRS) Nasopharyngeal Nasopharyngeal Swab     Status: None   Collection Time: 06/04/20  4:26 PM   Specimen: Nasopharyngeal Swab  Result Value Ref Range Status   SARS Coronavirus 2 NEGATIVE NEGATIVE Final    Comment: (NOTE) SARS-CoV-2 target nucleic acids are NOT DETECTED.  The SARS-CoV-2 RNA is generally detectable in upper and lower respiratory specimens during the acute phase of infection. Negative results do not preclude SARS-CoV-2 infection, do not rule out co-infections with other pathogens, and should not be used as the sole basis for treatment or other patient management decisions. Negative results must be combined with clinical observations, patient history, and epidemiological information. The expected result is Negative.  Fact Sheet for Patients: SugarRoll.be  Fact Sheet for Healthcare Providers: https://www.woods-mathews.com/  This test is not yet approved or cleared by the [place] FDA and  has been authorized for detection and/or diagnosis of SARS-CoV-2 by FDA under an Emergency Use Authorization (EUA). This EUA will remain  in effect (meaning this test can be used) for the duration of the COVID-19 declaration under Se ction 564(b)(1) of the Act, 21 U.S.C. section 360bbb-3(b)(1), unless the authorization is terminated or revoked sooner.  Performed at [facility], [place] [address], [place], [place] [address]   Blood Culture (routine x 2)     Status: None (Preliminary result)   Collection Time: 06/04/20  5:55 PM   Specimen: BLOOD  Result Value Ref Range Status   Specimen Description BLOOD SITE NOT SPECIFIED  Final   Special Requests   Final    BOTTLES  DRAWN AEROBIC AND ANAEROBIC Blood Culture results may not be optimal due to an inadequate volume of blood received in culture bottles   Culture   Final    NO GROWTH 2 DAYS Performed at [facility], [place] [address] [place], [place] [address]    Report Status PENDING  Incomplete  Blood Culture (routine x 2)     Status: None (Preliminary result)   Collection Time: 06/04/20  5:56 PM   Specimen: BLOOD  Result Value Ref Range Status   Specimen Description BLOOD SITE NOT SPECIFIED  Final   Special Requests   Final    BOTTLES DRAWN AEROBIC AND ANAEROBIC Blood Culture results may not be optimal due to an inadequate volume of blood received in culture bottles   Culture   Final    NO GROWTH 2 DAYS Performed at [facility], [place] [address], [place], [place] [address]    Report Status PENDING  Incomplete  Urine culture     Status: Abnormal   Collection Time: 06/05/20  3:35 AM   Specimen: In/Out Cath Urine  Result Value Ref Range Status   Specimen Description IN/OUT CATH URINE  Final   Special Requests   Final    NONE Performed at [facility], [place] [address] [place], [place] [address]    Culture MULTIPLE SPECIES PRESENT, SUGGEST RECOLLECTION (A)  Final   Report Status 06/06/2020 FINAL  Final  MRSA PCR Screening     Status: None   Collection Time: 06/05/20  7:51 AM   Specimen: Nasopharyngeal  Result Value Ref Range Status   MRSA by PCR NEGATIVE NEGATIVE Final    Comment:        The GeneXpert MRSA Assay (FDA approved for NASAL specimens only), is one component of a comprehensive MRSA colonization surveillance program. It is not intended to diagnose MRSA infection nor to guide or monitor treatment for MRSA infections. Performed at [facility], [place] [address], [place], [place] [address]       Radiology Studies: DG Chest Port 1 View  Result Date: 06/04/2020 CLINICAL DATA:  Wound check EXAM: PORTABLE CHEST 1 VIEW COMPARISON:  05/02/2019 FINDINGS: Cardiac  shadow is within normal limits. The lungs are clear bilaterally. No bony abnormality is noted. IMPRESSION: No acute abnormality seen. Electronically Signed   By: Inez Catalina M.D.   On: 06/04/2020 17:42   DG Foot 2 Views Right  Result Date: 06/04/2020 CLINICAL DATA:  Foot pain, known soft tissue wound EXAM: RIGHT FOOT - 2 VIEW COMPARISON:  None. FINDINGS: Considerable subcutaneous air is noted about the distal aspect of the fifth metatarsal and fifth digit and to a lesser degree about the fourth digit. Degenerative changes of the first MTP joint, tarsal bones and calcaneus are noted. No definitive erosive changes are noted at this time. IMPRESSION: Considerable subcutaneous air consistent with the known history of wound. No bony erosive changes are seen. MRI would be more sensitive for underlying osteomyelitis in this regard. Electronically Signed   By: Inez Catalina M.D.   On: 06/04/2020 17:42   VAS [place] ABI WITH/WO TBI  Result Date: 06/05/2020 LOWER EXTREMITY DOPPLER STUDY Indications: Ulceration, and gangrene. High Risk Factors: Hypertension, Diabetes.  Comparison Study: No prior study on file Performing Technologist: Sharion Dove RVS  Examination Guidelines: A complete evaluation includes at minimum, Doppler waveform signals and systolic blood pressure reading at the level  of bilateral brachial, anterior tibial, and posterior tibial arteries, when vessel segments are accessible. Bilateral testing is considered an integral part of a complete examination. Photoelectric Plethysmograph (PPG) waveforms and toe systolic pressure readings are included as required and additional duplex testing as needed. Limited examinations for reoccurring indications may be performed as noted.  ABI Findings: +---------+------------------+-----+-----------+----------------------+ Right    Rt Pressure (mmHg)IndexWaveform   Comment                +---------+------------------+-----+-----------+----------------------+  Brachial 161                    multiphasic                       +---------+------------------+-----+-----------+----------------------+ PTA      190               1.18 multiphasic                       +---------+------------------+-----+-----------+----------------------+ DP       186               1.16 multiphasic                       +---------+------------------+-----+-----------+----------------------+ Great Toe                                  toe too large for cuff +---------+------------------+-----+-----------+----------------------+ +---------+------------------+-----+-----------+----------------------+ Left     Lt Pressure (mmHg)IndexWaveform   Comment                +---------+------------------+-----+-----------+----------------------+ Brachial 160                    multiphasic                       +---------+------------------+-----+-----------+----------------------+ PTA      183               1.14 multiphasic                       +---------+------------------+-----+-----------+----------------------+ DP       183               1.14 multiphasic                       +---------+------------------+-----+-----------+----------------------+ Great Toe                                  toe too large for cuff +---------+------------------+-----+-----------+----------------------+ +-------+-----------+-----------+------------+------------+ ABI/TBIToday's ABIToday's TBIPrevious ABIPrevious TBI +-------+-----------+-----------+------------+------------+ Right  1.18                                           +-------+-----------+-----------+------------+------------+ Left   1.14                                           +-------+-----------+-----------+------------+------------+  Summary: Right: Resting right ankle-brachial index is within normal range. No evidence of significant right lower extremity arterial disease. Left: Resting left  ankle-brachial index is within normal  range. No evidence of significant left lower extremity arterial disease.  *See table(s) above for measurements and observations.  Electronically signed by Servando Snare MD on 06/05/2020 at 1:58:53 PM.    Final     Scheduled Meds: . chlorhexidine  60 mL Topical Once  . insulin aspart  0-15 Units Subcutaneous TID WC  . insulin aspart  0-5 Units Subcutaneous QHS  . insulin aspart  8 Units Subcutaneous TID WC  . insulin detemir  30 Units Subcutaneous BID  . povidone-iodine  2 application Topical Once   Continuous Infusions: . sodium chloride 125 mL/hr at 06/06/20 0618  .  ceFAZolin (ANCEF) IV    . clindamycin (CLEOCIN) IV 600 mg (06/06/20 0255)  . piperacillin-tazobactam (ZOSYN)  IV 3.375 g (06/06/20 0939)  . vancomycin 1,500 mg (06/05/20 2116)     LOS: 2 days   Time spent: 31 minutes   Darliss Cheney, MD [facility]  06/06/2020, 12:05 PM   To contact the attending provider between 7A-7P or the covering provider during after hours 7P-7A, please log into the web site [email or web address].

## 2020-06-06 NOTE — Anesthesia Procedure Notes (Signed)
Anesthesia Regional Block: Popliteal block   Pre-Anesthetic Checklist: ,, timeout performed, Correct Patient, Correct Site, Correct Laterality, Correct Procedure, Correct Position, site marked, Risks and benefits discussed,  Surgical consent,  Pre-op evaluation,  At surgeon's request and post-op pain management  Laterality: Right  Prep: chloraprep       Needles:  Injection technique: Single-shot  Needle Type: Echogenic Stimulator Needle          Additional Needles:    Nerve Stimulator or Paresthesia:  Response: foot flexion, 0.4 mA,   Additional Responses:   Narrative:  Start time: 06/06/2020 2:46 PM End time: 06/06/2020 2:56 PM Injection made incrementally with aspirations every 5 mL.  Performed by: Personally  Anesthesiologist: Duane [place], MD  Additional Notes: A functioning IV was confirmed and monitors were applied.  Sterile prep and drape, hand hygiene and sterile gloves were used.  Negative aspiration and test dose prior to incremental administration of local anesthetic. The patient tolerated the procedure well.Ultrasound  guidance: relevant anatomy identified, needle position confirmed, local anesthetic spread visualized around nerve(s), vascular puncture avoided.  Image printed for medical record.

## 2020-06-06 NOTE — Op Note (Signed)
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 Excisional debridement of skin and soft tissue muscle and fascia across the dorsum of the foot through multiple compartments. Application of cleanse choice installation wound VAC sponges x2    SURGEON:  [name], MD  PHYSICIAN ASSISTANT:None ANESTHESIA:   General  PREOPERATIVE INDICATIONS:  Patrick [name] is a  55 y.o. male with a diagnosis of ABSCESS RIGHT FOOT who failed conservative measures and elected for surgical management.    The risks benefits and alternatives were discussed with the patient preoperatively including but not limited to the risks of infection, bleeding, nerve injury, cardiopulmonary complications, the need for revision surgery, among others, and the patient was willing to proceed.  OPERATIVE IMPLANTS: Cleanse choice wound VAC sponges x2  '@ENCIMAGES'$ @  OPERATIVE FINDINGS: Patient had necrotic tissue around the fifth ray and also had extensive abscess that extended over the dorsum of his foot.  The soft tissue and bone was sent for cultures.  OPERATIVE PROCEDURE: Patient was brought the operating room after undergoing a regional anesthetic.  After adequate levels anesthesia were obtained patient's right lower extremity was prepped using DuraPrep draped in the sterile field a timeout was called.  A elliptical incision was made around the ulcerative necrotic tissue laterally and the fifth metatarsal was resected through the base.  This left a wound that was 3 x 11 cm.  The soft tissue was debrided back to healthy viable margins.  The area of debridement was then carried dorsally over the foot.  There is a large purulent abscess over the dorsum of the foot.  Patient underwent excision of the skin and soft tissue muscle and fascia involved with the dorsal foot abscess.  The wounds were  then irrigated with normal saline electrocautery was used for hemostasis.  The wounds were packed open with the reticulated cleanse choice sponge followed by the solid sponge this was covered with derma tack set for installation therapy with 14 cc of irrigant 10 minutes of dwell time 2 hours of suction.  The dressing was overwrapped with Covan.  Patient was taken the PACU in stable condition.  Debridement type: Excisional Debridement  Side: right  Body Location: Right foot  Tools used for debridement: scalpel, scissors and rongeur  Pre-debridement Wound size (cm):   Length: 1        Width: 1     Depth: 1   Post-debridement Wound size (cm):   Length: 11        Width: 3     Depth: 7   Debridement depth beyond dead/damaged tissue down to healthy viable tissue: yes  Tissue layer involved: skin, subcutaneous tissue, muscle / fascia, bone  Nature of tissue removed: Slough, Necrotic, Devitalized Tissue, Non-viable tissue and Purulence  Irrigation volume: 1 liter     Irrigation fluid type: Normal Saline        DISCHARGE PLANNING:  Antibiotic duration: Continue antibiotics and adjust according to cultures.  Patient will need at least 3 weeks of antibiotic coverage.  Weightbearing: Nonweightbearing on the right.  Pain medication: Opioid pathway ordered  Dressing care/ Wound VAC: Continue installation wound VAC until Wednesday.  Ambulatory devices: Walker or crutches  Discharge to: Plan for return to the operating room on Wednesday for further debridement and attempted wound closure.  Follow-up: In the office 1 week post operative.

## 2020-06-06 NOTE — Interval H&P Note (Signed)
History and Physical Interval Note:  06/06/2020 3:20 PM  Patrick [name]  has presented today for surgery, with the diagnosis of ABSCESS RIGHT FOOT.  The various methods of treatment have been discussed with the patient and family. After consideration of risks, benefits and other options for treatment, the patient has consented to  Procedure(s): AMPUTATION 5TH RAY (Right) as a surgical intervention.  The patient's history has been reviewed, patient examined, no change in status, stable for surgery.  I have reviewed the patient's chart and labs.  Questions were answered to the patient's satisfaction.     [name]

## 2020-06-06 NOTE — Progress Notes (Signed)
Nutrition Follow-up  DOCUMENTATION CODES:   Morbid obesity  INTERVENTION:  Ensure Max po BID, each supplement provides 150 kcal and 30 grams of protein  83m Prosource Plus po BID, each supplement provides 100 kcals and 15 grams of protein  MVI with minerals daily  Double protein portions  Provided detailed diabetes diet education in addition to diabetes-friendly recipes   NUTRITION DIAGNOSIS:   Increased nutrient needs related to wound healing as evidenced by estimated needs.    GOAL:   Patient will meet greater than or equal to 90% of their needs    MONITOR:   PO intake,Supplement acceptance,Diet advancement,Weight trends,Labs,I & O's  REASON FOR ASSESSMENT:   Consult Wound healing  ASSESSMENT:   Pt admitted with sepsis 2/2 R foot diabetic ulcer/cellulitis  PMH includes type 2 DM, HTN, h/o COVID (3/21)  4/17 s/p ABI  Pt scheduled for amputation today. Discussed pt with RN.   Pt in good spirits, but endorses feeling very hungry. Per pt, he has not experienced any recent changes to intake/appetite and/or weight status. Pt reports eating at least 3-4 balanced meals per day. Per pt, he was unable to take his usual home insulin regimen for some time due to lack of insurance, but has since been able to resume his medications. Discussed pt's blood sugar control at home and provided education on how to improve blood glucose levels and lower A1C. Also discussed importance of adequate protein intake for wound healing and informed pt of wound clinic in hopes that he will present to the clinic next time he has an injury he cannot tend to himself. Pt appreciative of information and is agreeable to use of supplements and double protein portions to help meet increased protein needs.   UOP: [id number] hours   Medications: SSI TID w/ meals and bedtime, 8 units novolog TID w/ meals, 30 units levemir BID Labs: Na 131 (L), Mg 1.3 (L) CBGs [id number] Order:   Diet Order     None      EDUCATION NEEDS:   Education needs have been addressed  Skin:  Skin Assessment: Skin Integrity Issues: Skin Integrity Issues:: Incisions Incisions: R toe  Last BM:  4/17  Height:   Ht Readings from Last 1 Encounters:  06/04/20 6' (1.829 m)    Weight:   Wt Readings from Last 1 Encounters:  06/04/20 (!) 145.8 kg    Ideal Body Weight:  80.9 kg  BMI:  Body mass index is 43.59 kg/m.  Estimated Nutritional Needs:   Kcal:  2600-2800  Protein:  160-200 grams  Fluid:  >2L   AmLarkin InaMS, RD, LDN RD pager number and weekend/on-call pager number located in [facility]

## 2020-06-06 NOTE — Anesthesia Preprocedure Evaluation (Addendum)
Anesthesia Evaluation  Patient identified by MRN, date of birth, ID band Patient awake    Reviewed: Allergy & Precautions, NPO status , Patient's Chart, lab work & pertinent test results  History of Anesthesia Complications Negative for: history of anesthetic complications  Airway Mallampati: II  TM Distance: >3 FB     Dental no notable dental hx. (+) Dental Advisory Given   Pulmonary neg pulmonary ROS,    Pulmonary exam normal        Cardiovascular hypertension, Pt. on medications Normal cardiovascular exam     Neuro/Psych negative neurological ROS     GI/Hepatic negative GI ROS, Neg liver ROS,   Endo/Other  diabetesMorbid obesity  Renal/GU Renal InsufficiencyRenal disease     Musculoskeletal Gout   Abdominal   Peds  Hematology negative hematology ROS (+)   Anesthesia Other Findings   Reproductive/Obstetrics                            Anesthesia Physical Anesthesia Plan  ASA: III  Anesthesia Plan: MAC   Post-op Pain Management:  Regional for Post-op pain   Induction:   PONV Risk Score and Plan: 1  Airway Management Planned: Natural Airway  Additional Equipment:   Intra-op Plan:   Post-operative Plan:   Informed Consent: I have reviewed the patients History and Physical, chart, labs and discussed the procedure including the risks, benefits and alternatives for the proposed anesthesia with the patient or authorized representative who has indicated his/her understanding and acceptance.     Dental advisory given  Plan Discussed with: Anesthesiologist and CRNA  Anesthesia Plan Comments:        Anesthesia Quick Evaluation

## 2020-06-06 NOTE — Plan of Care (Signed)
  Problem: Education: Goal: Knowledge of General Education information will improve Description: Including pain rating scale, medication(s)/side effects and non-pharmacologic comfort measures 06/06/2020 2104 by Berta Minor, RN Outcome: Progressing 06/06/2020 2104 by Berta Minor, RN Outcome: Progressing   Problem: Health Behavior/Discharge Planning: Goal: Ability to manage health-related needs will improve 06/06/2020 2104 by Berta Minor, RN Outcome: Progressing 06/06/2020 2104 by Berta Minor, RN Outcome: Progressing   Problem: Clinical Measurements: Goal: Ability to maintain clinical measurements within normal limits will improve 06/06/2020 2104 by Berta Minor, RN Outcome: Progressing 06/06/2020 2104 by Berta Minor, RN Outcome: Progressing Goal: Will remain free from infection 06/06/2020 2104 by Berta Minor, RN Outcome: Progressing 06/06/2020 2104 by Berta Minor, RN Outcome: Progressing Goal: Diagnostic test results will improve 06/06/2020 2104 by Berta Minor, RN Outcome: Progressing 06/06/2020 2104 by Berta Minor, RN Outcome: Progressing Goal: Respiratory complications will improve 06/06/2020 2104 by Berta Minor, RN Outcome: Progressing 06/06/2020 2104 by Berta Minor, RN Outcome: Progressing Goal: Cardiovascular complication will be avoided 06/06/2020 2104 by Berta Minor, RN Outcome: Progressing 06/06/2020 2104 by Berta Minor, RN Outcome: Progressing   Problem: Activity: Goal: Risk for activity intolerance will decrease 06/06/2020 2104 by Berta Minor, RN Outcome: Progressing 06/06/2020 2104 by Berta Minor, RN Outcome: Progressing   Problem: Nutrition: Goal: Adequate nutrition will be maintained 06/06/2020 2104 by Berta Minor, RN Outcome: Progressing 06/06/2020 2104 by Berta Minor, RN Outcome: Progressing   Problem:  Coping: Goal: Level of anxiety will decrease 06/06/2020 2104 by Berta Minor, RN Outcome: Progressing 06/06/2020 2104 by Berta Minor, RN Outcome: Progressing   Problem: Elimination: Goal: Will not experience complications related to bowel motility 06/06/2020 2104 by Berta Minor, RN Outcome: Progressing 06/06/2020 2104 by Berta Minor, RN Outcome: Progressing Goal: Will not experience complications related to urinary retention 06/06/2020 2104 by Berta Minor, RN Outcome: Progressing 06/06/2020 2104 by Berta Minor, RN Outcome: Progressing   Problem: Pain Managment: Goal: General experience of comfort will improve 06/06/2020 2104 by Berta Minor, RN Outcome: Progressing 06/06/2020 2104 by Berta Minor, RN Outcome: Progressing   Problem: Safety: Goal: Ability to remain free from injury will improve 06/06/2020 2104 by Berta Minor, RN Outcome: Progressing 06/06/2020 2104 by Berta Minor, RN Outcome: Progressing   Problem: Skin Integrity: Goal: Risk for impaired skin integrity will decrease 06/06/2020 2104 by Berta Minor, RN Outcome: Progressing 06/06/2020 2104 by Berta Minor, RN Outcome: Progressing

## 2020-06-06 NOTE — Anesthesia Postprocedure Evaluation (Signed)
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  Last Vitals:  Vitals:   06/06/20 1709 06/06/20 1843  BP: (!) 150/89   Pulse: 92   Resp: 12   Temp: (!) 38.3 C 37.9 C  SpO2: 97%     Last Pain:  Vitals:   06/06/20 1709  TempSrc: Oral  PainSc: 9                  Linda Biehn DANIEL

## 2020-06-06 NOTE — Transfer of Care (Signed)
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  Last Pain:  Vitals:   06/06/20 1231  TempSrc: Oral  PainSc:          Complications: No complications documented.

## 2020-06-06 NOTE — Discharge Instructions (Signed)
Outpatient Diabetes Education  You have been referred to [facility]'s Nutrition and Diabetes Education Services for outpatient diabetes education. A referral has been sent and the office will contact you for an appointment. For additional questions or to schedule an appointment, call [phone].    You may also reach the inpatient RD by [email or web address]  Great sites for recipes: -[email or web address].[email or web address].it   Carbohydrate Counting For People With Diabetes  Foods with carbohydrates make your blood glucose level go up. Learning how to count carbohydrates can help you control your blood glucose levels. First, identify the foods you eat that contain carbohydrates. Then, using the Foods with Carbohydrates chart, determine about how much carbohydrates are in your meals and snacks. Make sure you are eating foods with fiber, protein, and healthy fat along with your carbohydrate foods. Foods with Carbohydrates The following table shows carbohydrate foods that have about 15 grams of carbohydrate each. Using measuring cups, spoons, or a food scale when you first begin learning about carbohydrate counting can help you learn about the portion sizes you typically eat. The following foods have 15 grams carbohydrate each:  Grains . 1 slice bread (1 ounce)  . 1 small tortilla (6-inch size)  .  large bagel (1 ounce)  . 1/3 cup pasta or rice (cooked)  .  hamburger or hot dog bun ( ounce)  .  cup cooked cereal  .  to  cup ready-to-eat cereal  . 2 taco shells (5-inch size) Fruit . 1 small fresh fruit ( to 1 cup)  .  medium banana  . 17 small grapes (3 ounces)  . 1 cup melon or berries  .  cup canned or frozen fruit  . 2 tablespoons dried fruit (blueberries, cherries, cranberries, raisins)  .  cup unsweetened fruit juice  Starchy Vegetables .  cup cooked beans, peas, corn, potatoes/sweet potatoes   .  large baked potato (3 ounces)  . 1 cup acorn or butternut squash  Snack Foods . 3 to 6 crackers  . 8 potato chips or 13 tortilla chips ( ounce to 1 ounce)  . 3 cups popped popcorn  Dairy . 3/4 cup (6 ounces) nonfat plain yogurt, or yogurt with sugar-free sweetener  . 1 cup milk  . 1 cup plain rice, soy, coconut or flavored almond milk Sweets and Desserts .  cup ice cream or frozen yogurt  . 1 tablespoon jam, jelly, pancake syrup, table sugar, or honey  . 2 tablespoons light pancake syrup  . 1 inch square of frosted cake or 2 inch square of unfrosted cake  . 2 small cookies (2/3 ounce each) or  large cookie  Sometimes you'll have to estimate carbohydrate amounts if you don't know the exact recipe. One cup of mixed foods like soups can have 1 to 2 carbohydrate servings, while some casseroles might have 2 or more servings of carbohydrate. Foods that have less than 20 calories in each serving can be counted as "free" foods. Count 1 cup raw vegetables, or  cup cooked non-starchy vegetables as "free" foods. If you eat 3 or more servings at one meal, then count them as 1 carbohydrate serving.  Foods without Carbohydrates  Not all foods contain carbohydrates. Meat, some dairy, fats, non-starchy vegetables, and many beverages don't contain carbohydrate. So when you count carbohydrates, you can generally exclude chicken, pork, beef, fish, seafood, eggs, tofu, cheese, butter, sour cream, avocado, nuts, seeds, olives, mayonnaise, water, black coffee, unsweetened tea, and zero-calorie drinks. Vegetables with no or low  carbohydrate include green beans, cauliflower, tomatoes, and onions. How much carbohydrate should I eat at each meal?  Carbohydrate counting can help you plan your meals and manage your weight. Following are some starting points for carbohydrate intake at each meal. Work with your registered dietitian nutritionist to find the best range that works for your blood glucose and weight.    To Lose Weight To Maintain Weight  Women 2 - 3 carb servings 3 - 4 carb servings  Men 3 - 4 carb servings 4 - 5 carb servings  Checking your blood glucose after meals will help you know if you need to adjust the timing, type, or number of carbohydrate servings in your meal plan. Achieve and keep a healthy body weight by balancing your food intake and physical activity.  Tips How should I plan my meals?  Plan for half the food on your plate to include non-starchy vegetables, like salad greens, broccoli, or carrots. Try to eat 3 to 5 servings of non-starchy vegetables every day. Have a protein food at each meal. Protein foods include chicken, fish, meat, eggs, or beans (note that beans contain carbohydrate). These two food groups (non-starchy vegetables and proteins) are low in carbohydrate. If you fill up your plate with these foods, you will eat less carbohydrate but still fill up your stomach. Try to limit your carbohydrate portion to  of the plate.  What fats are healthiest to eat?  Diabetes increases risk for heart disease. To help protect your heart, eat more healthy fats, such as olive oil, nuts, and avocado. Eat less saturated fats like butter, cream, and high-fat meats, like bacon and sausage. Avoid trans fats, which are in all foods that list "partially hydrogenated oil" as an ingredient. What should I drink?  Choose drinks that are not sweetened with sugar. The healthiest choices are water, carbonated or seltzer waters, and tea and coffee without added sugars.  Sweet drinks will make your blood glucose go up very quickly. One serving of soda or energy drink is  cup. It is best to drink these beverages only if your blood glucose is low.  Artificially sweetened, or diet drinks, typically do not increase your blood glucose if they have zero calories in them. Read labels of beverages, as some diet drinks do have carbohydrate and will raise your blood glucose. Label Reading Tips Read Nutrition  Facts labels to find out how many grams of carbohydrate are in a food you want to eat. Don't forget: sometimes serving sizes on the label aren't the same as how much food you are going to eat, so you may need to calculate how much carbohydrate is in the food you are serving yourself.   Carbohydrate Counting for People with Diabetes Sample 1-Day Menu  Breakfast  cup yogurt, low fat, low sugar (1 carbohydrate serving)   cup cereal, ready-to-eat, unsweetened (1 carbohydrate serving)  1 cup strawberries (1 carbohydrate serving)   cup almonds ( carbohydrate serving)  Lunch 1, 5 ounce can chunk light tuna  2 ounces cheese, low fat cheddar  6 whole wheat crackers (1 carbohydrate serving)  1 small apple (1 carbohydrate servings)   cup carrots ( carbohydrate serving)   cup snap peas  1 cup 1% milk (1 carbohydrate serving)   Evening Meal Stir fry made with: 3 ounces chicken  1 cup brown rice (3 carbohydrate servings)   cup broccoli ( carbohydrate serving)   cup green beans   cup onions  1 tablespoon olive oil  2 tablespoons teriyaki sauce ( carbohydrate serving)  Evening Snack 1 extra small banana (1 carbohydrate serving)  1 tablespoon peanut butter   Carbohydrate Counting for People with Diabetes Vegan Sample 1-Day Menu  Breakfast 1 cup cooked oatmeal (2 carbohydrate servings)   cup blueberries (1 carbohydrate serving)  2 tablespoons flaxseeds  1 cup soymilk fortified with calcium and vitamin D  1 cup coffee  Lunch 2 slices whole wheat bread (2 carbohydrate servings)   cup baked tofu   cup lettuce  2 slices tomato  2 slices avocado   cup baby carrots ( carbohydrate serving)  1 orange (1 carbohydrate serving)  1 cup soymilk fortified with calcium and vitamin D   Evening Meal Burrito made with: 1 6-inch corn tortilla (1 carbohydrate serving)  1 cup refried vegetarian beans (2 carbohydrate servings)   cup chopped tomatoes   cup lettuce   cup salsa  1/3 cup brown  rice (1 carbohydrate serving)  1 tablespoon olive oil for rice   cup zucchini   Evening Snack 6 small whole grain crackers (1 carbohydrate serving)  2 apricots ( carbohydrate serving)   cup unsalted peanuts ( carbohydrate serving)    Carbohydrate Counting for People with Diabetes Vegetarian (Lacto-Ovo) Sample 1-Day Menu  Breakfast 1 cup cooked oatmeal (2 carbohydrate servings)   cup blueberries (1 carbohydrate serving)  2 tablespoons flaxseeds  1 egg  1 cup 1% milk (1 carbohydrate serving)  1 cup coffee  Lunch 2 slices whole wheat bread (2 carbohydrate servings)  2 ounces low-fat cheese   cup lettuce  2 slices tomato  2 slices avocado   cup baby carrots ( carbohydrate serving)  1 orange (1 carbohydrate serving)  1 cup unsweetened tea  Evening Meal Burrito made with: 1 6-inch corn tortilla (1 carbohydrate serving)   cup refried vegetarian beans (1 carbohydrate serving)   cup tomatoes   cup lettuce   cup salsa  1/3 cup brown rice (1 carbohydrate serving)  1 tablespoon olive oil for rice   cup zucchini  1 cup 1% milk (1 carbohydrate serving)  Evening Snack 6 small whole grain crackers (1 carbohydrate serving)  2 apricots ( carbohydrate serving)   cup unsalted peanuts ( carbohydrate serving)    Copyright 2020  Academy of Nutrition and Dietetics. All rights reserved.  Using Nutrition Labels: Carbohydrate  . Serving Size  . Look at the serving size. All the information on the label is based on this portion. Randol Kern Per Container  . The number of servings contained in the package. . Guidelines for Carbohydrate  . Look at the total grams of carbohydrate in the serving size.  . 1 carbohydrate choice = 15 grams of carbohydrate. Range of Carbohydrate Grams Per Choice  Carbohydrate Grams/Choice Carbohydrate Choices  6-10   11-20 1  21-25 1  26-35 2  36-40 2  41-50 3  51-55 3  56-65 4  66-70 4  71-80 5    Copyright 2020  Academy of Nutrition  and Dietetics. All rights reserved.

## 2020-06-06 NOTE — Plan of Care (Signed)
  Problem: Education: Goal: Knowledge of General Education information will improve Description: Including pain rating scale, medication(s)/side effects and non-pharmacologic comfort measures 06/06/2020 1442 by Durwin Glaze, LPN Outcome: Progressing 06/06/2020 1442 by Durwin Glaze, LPN Outcome: Progressing   Problem: Health Behavior/Discharge Planning: Goal: Ability to manage health-related needs will improve 06/06/2020 1442 by Durwin Glaze, LPN Outcome: Progressing 06/06/2020 1442 by Durwin Glaze, LPN Outcome: Progressing   Problem: Clinical Measurements: Goal: Ability to maintain clinical measurements within normal limits will improve 06/06/2020 1442 by Durwin Glaze, LPN Outcome: Progressing 06/06/2020 1442 by Durwin Glaze, LPN Outcome: Progressing Goal: Will remain free from infection 06/06/2020 1442 by Durwin Glaze, LPN Outcome: Progressing 06/06/2020 1442 by Durwin Glaze, LPN Outcome: Progressing Goal: Diagnostic test results will improve 06/06/2020 1442 by Durwin Glaze, LPN Outcome: Progressing 06/06/2020 1442 by Durwin Glaze, LPN Outcome: Progressing Goal: Respiratory complications will improve 06/06/2020 1442 by Durwin Glaze, LPN Outcome: Progressing 06/06/2020 1442 by Durwin Glaze, LPN Outcome: Progressing Goal: Cardiovascular complication will be avoided 06/06/2020 1442 by Durwin Glaze, LPN Outcome: Progressing 06/06/2020 1442 by Durwin Glaze, LPN Outcome: Progressing   Problem: Activity: Goal: Risk for activity intolerance will decrease 06/06/2020 1442 by Durwin Glaze, LPN Outcome: Progressing 06/06/2020 1442 by Durwin Glaze, LPN Outcome: Progressing   Problem: Nutrition: Goal: Adequate nutrition will be maintained 06/06/2020 1442 by Durwin Glaze, LPN Outcome: Progressing 06/06/2020 1442 by Durwin Glaze, LPN Outcome: Progressing   Problem: Coping: Goal: Level of anxiety will decrease 06/06/2020  1442 by Durwin Glaze, LPN Outcome: Progressing 06/06/2020 1442 by Durwin Glaze, LPN Outcome: Progressing   Problem: Elimination: Goal: Will not experience complications related to bowel motility 06/06/2020 1442 by Durwin Glaze, LPN Outcome: Progressing 06/06/2020 1442 by Durwin Glaze, LPN Outcome: Progressing Goal: Will not experience complications related to urinary retention 06/06/2020 1442 by Durwin Glaze, LPN Outcome: Progressing 06/06/2020 1442 by Durwin Glaze, LPN Outcome: Progressing   Problem: Pain Managment: Goal: General experience of comfort will improve Outcome: Progressing   Problem: Safety: Goal: Ability to remain free from injury will improve Outcome: Progressing   Problem: Skin Integrity: Goal: Risk for impaired skin integrity will decrease Outcome: Progressing

## 2020-06-07 ENCOUNTER — Other Ambulatory Visit: Payer: Self-pay | Admitting: Physician Assistant

## 2020-06-07 ENCOUNTER — Encounter (HOSPITAL_COMMUNITY): Payer: Self-pay | Admitting: Orthopedic Surgery

## 2020-06-07 LAB — COMPREHENSIVE METABOLIC PANEL
ALT: 17 U/L (ref 0–44)
AST: 17 U/L (ref 15–41)
Albumin: 2.1 g/dL — ABNORMAL LOW (ref 3.5–5.0)
Alkaline Phosphatase: 137 U/L — ABNORMAL HIGH (ref 38–126)
Anion gap: 11 (ref 5–15)
BUN: 29 mg/dL — ABNORMAL HIGH (ref 6–20)
CO2: 19 mmol/L — ABNORMAL LOW (ref 22–32)
Calcium: 8.5 mg/dL — ABNORMAL LOW (ref 8.9–10.3)
Chloride: 102 mmol/L (ref 98–111)
Creatinine, Ser: 2.35 mg/dL — ABNORMAL HIGH (ref 0.61–1.24)
GFR, Estimated: 32 mL/min — ABNORMAL LOW (ref >60)
Glucose, Bld: 374 mg/dL — ABNORMAL HIGH (ref 70–99)
Potassium: 4.7 mmol/L (ref 3.5–5.1)
Sodium: 132 mmol/L — ABNORMAL LOW (ref 135–145)
Total Bilirubin: 1 mg/dL (ref 0.3–1.2)
Total Protein: 6.2 g/dL — ABNORMAL LOW (ref 6.5–8.1)

## 2020-06-07 LAB — CBC WITH DIFFERENTIAL/PLATELET
Abs Immature Granulocytes: 0.31 10*3/uL — ABNORMAL HIGH (ref 0.00–0.07)
Basophils Absolute: 0 10*3/uL (ref 0.0–0.1)
Basophils Relative: 0 %
Eosinophils Absolute: 0.1 10*3/uL (ref 0.0–0.5)
Eosinophils Relative: 0 %
HCT: 28.8 % — ABNORMAL LOW (ref 39.0–52.0)
Hemoglobin: 9 g/dL — ABNORMAL LOW (ref 13.0–17.0)
Immature Granulocytes: 2 %
Lymphocytes Relative: 5 %
Lymphs Abs: 1 10*3/uL (ref 0.7–4.0)
MCH: 26.6 pg (ref 26.0–34.0)
MCHC: 31.3 g/dL (ref 30.0–36.0)
MCV: 85.2 fL (ref 80.0–100.0)
Monocytes Absolute: 1.1 10*3/uL — ABNORMAL HIGH (ref 0.1–1.0)
Monocytes Relative: 5 %
Neutro Abs: 18.4 10*3/uL — ABNORMAL HIGH (ref 1.7–7.7)
Neutrophils Relative %: 88 %
Platelets: 437 10*3/uL — ABNORMAL HIGH (ref 150–400)
RBC: 3.38 MIL/uL — ABNORMAL LOW (ref 4.22–5.81)
RDW: 13.6 % (ref 11.5–15.5)
WBC: 20.9 10*3/uL — ABNORMAL HIGH (ref 4.0–10.5)
nRBC: 0 % (ref 0.0–0.2)

## 2020-06-07 LAB — GLUCOSE, CAPILLARY
Glucose-Capillary: 419 mg/dL — ABNORMAL HIGH (ref 70–99)
Glucose-Capillary: 431 mg/dL — ABNORMAL HIGH (ref 70–99)
Glucose-Capillary: 408 mg/dL — ABNORMAL HIGH (ref 70–99)
Glucose-Capillary: 270 mg/dL — ABNORMAL HIGH (ref 70–99)

## 2020-06-07 LAB — HEMOGLOBIN A1C
Hgb A1c MFr Bld: >15.5 % — ABNORMAL HIGH (ref 4.8–5.6)
Mean Plasma Glucose: >398 mg/dL

## 2020-06-07 MED ORDER — CEFAZOLIN IN SODIUM CHLORIDE 3-0.9 GM/100ML-% IV SOLN
3.0000 g | INTRAVENOUS | Status: DC
  Filled 2020-06-07: qty 100

## 2020-06-07 MED ORDER — METRONIDAZOLE IN NACL 5-0.79 MG/ML-% IV SOLN
500.0000 mg | Freq: Three times a day (TID) | INTRAVENOUS | Status: DC
  Administered 2020-06-08 – 2020-06-10 (×8): 500 mg via INTRAVENOUS
  Filled 2020-06-07 (×8): qty 100

## 2020-06-07 MED ORDER — LABETALOL HCL 5 MG/ML IV SOLN: 10.0000 mg | INTRAVENOUS | Status: DC | PRN

## 2020-06-07 MED ORDER — ONDANSETRON HCL 4 MG/2ML IJ SOLN: 4.0000 mg | Freq: Four times a day (QID) | INTRAMUSCULAR | Status: DC | PRN

## 2020-06-07 MED ORDER — VANCOMYCIN HCL 1250 MG/250ML IV SOLN
1250.0000 mg | INTRAVENOUS | Status: DC
  Administered 2020-06-07 – 2020-06-09 (×3): 1250 mg via INTRAVENOUS
  Filled 2020-06-07 (×4): qty 250

## 2020-06-07 MED ORDER — POLYETHYLENE GLYCOL 3350 17 G PO PACK: 17.0000 g | PACK | Freq: Every day | ORAL | Status: DC | PRN

## 2020-06-07 MED ORDER — POTASSIUM CHLORIDE CRYS ER 20 MEQ PO TBCR: 20.0000 meq | EXTENDED_RELEASE_TABLET | Freq: Every day | ORAL | Status: DC | PRN

## 2020-06-07 MED ORDER — MAGNESIUM SULFATE 2 GM/50ML IV SOLN
2.0000 g | Freq: Every day | INTRAVENOUS | Status: DC | PRN
Start: 2020-06-07 — End: 2020-06-10

## 2020-06-07 MED ORDER — PANTOPRAZOLE SODIUM 40 MG PO TBEC
40.0000 mg | DELAYED_RELEASE_TABLET | Freq: Every day | ORAL | Status: DC
  Administered 2020-06-07 – 2020-06-10 (×4): 40 mg via ORAL
  Filled 2020-06-07 (×4): qty 1

## 2020-06-07 MED ORDER — OXYCODONE HCL 5 MG PO TABS
5.0000 mg | ORAL_TABLET | ORAL | Status: DC | PRN
  Administered 2020-06-08 (×2): 5 mg via ORAL
  Administered 2020-06-09: 10 mg via ORAL
  Filled 2020-06-07: qty 1
  Filled 2020-06-07: qty 2
  Filled 2020-06-07: qty 1

## 2020-06-07 MED ORDER — OXYCODONE HCL 5 MG PO TABS
10.0000 mg | ORAL_TABLET | ORAL | Status: DC | PRN
  Administered 2020-06-07 – 2020-06-09 (×2): 10 mg via ORAL
  Filled 2020-06-07 (×3): qty 2

## 2020-06-07 MED ORDER — GUAIFENESIN-DM 100-10 MG/5ML PO SYRP: 15.0000 mL | ORAL_SOLUTION | ORAL | Status: DC | PRN

## 2020-06-07 MED ORDER — INSULIN STARTER KIT- PEN NEEDLES (ENGLISH)
1.0000 | Freq: Once | Status: DC
  Filled 2020-06-07: qty 1

## 2020-06-07 MED ORDER — HYDRALAZINE HCL 20 MG/ML IJ SOLN: 5.0000 mg | INTRAMUSCULAR | Status: DC | PRN

## 2020-06-07 MED ORDER — ALUM & MAG HYDROXIDE-SIMETH 200-200-20 MG/5ML PO SUSP
15.0000 mL | ORAL | Status: DC | PRN
Start: 2020-06-07 — End: 2020-06-10

## 2020-06-07 MED ORDER — GLUCERNA SHAKE PO LIQD
237.0000 mL | Freq: Three times a day (TID) | ORAL | Status: DC
  Administered 2020-06-07 – 2020-06-09 (×3): 237 mL via ORAL
  Filled 2020-06-07 (×2): qty 237

## 2020-06-07 MED ORDER — DOCUSATE SODIUM 100 MG PO CAPS
100.0000 mg | ORAL_CAPSULE | Freq: Every day | ORAL | Status: DC
  Administered 2020-06-09 – 2020-06-10 (×2): 100 mg via ORAL
  Filled 2020-06-07 (×4): qty 1

## 2020-06-07 MED ORDER — PHENOL 1.4 % MT LIQD: 1.0000 | OROMUCOSAL | Status: DC | PRN

## 2020-06-07 MED ORDER — HYDROMORPHONE HCL 1 MG/ML IJ SOLN
0.5000 mg | INTRAMUSCULAR | Status: DC | PRN
  Administered 2020-06-08: 1 mg via INTRAVENOUS
  Filled 2020-06-07: qty 1

## 2020-06-07 MED ORDER — ACETAMINOPHEN 325 MG PO TABS: 325.0000 mg | ORAL_TABLET | Freq: Four times a day (QID) | ORAL | Status: DC | PRN

## 2020-06-07 MED ORDER — BISACODYL 5 MG PO TBEC: 5.0000 mg | DELAYED_RELEASE_TABLET | Freq: Every day | ORAL | Status: DC | PRN

## 2020-06-07 MED ORDER — MAGNESIUM CITRATE PO SOLN: 1.0000 | Freq: Once | ORAL | Status: DC | PRN

## 2020-06-07 MED ORDER — ZINC SULFATE 220 (50 ZN) MG PO CAPS
220.0000 mg | ORAL_CAPSULE | Freq: Every day | ORAL | Status: DC
  Administered 2020-06-07 – 2020-06-10 (×4): 220 mg via ORAL
  Filled 2020-06-07 (×4): qty 1

## 2020-06-07 MED ORDER — OXYCODONE HCL 5 MG PO TABS: 10.0000 mg | ORAL_TABLET | ORAL | Status: DC | PRN

## 2020-06-07 MED ORDER — ASCORBIC ACID 500 MG PO TABS
1000.0000 mg | ORAL_TABLET | Freq: Every day | ORAL | Status: DC
  Administered 2020-06-07 – 2020-06-10 (×4): 1000 mg via ORAL
  Filled 2020-06-07 (×4): qty 2

## 2020-06-07 MED ORDER — SODIUM CHLORIDE 0.9 % IV SOLN
2.0000 g | Freq: Two times a day (BID) | INTRAVENOUS | Status: DC
  Administered 2020-06-07 – 2020-06-08 (×2): 2 g via INTRAVENOUS
  Filled 2020-06-07 (×3): qty 2

## 2020-06-07 MED ORDER — METOPROLOL TARTRATE 5 MG/5ML IV SOLN: 2.0000 mg | INTRAVENOUS | Status: DC | PRN

## 2020-06-07 MED ORDER — INSULIN DETEMIR 100 UNIT/ML ~~LOC~~ SOLN
45.0000 [IU] | Freq: Two times a day (BID) | SUBCUTANEOUS | Status: DC
  Administered 2020-06-07 – 2020-06-08 (×4): 45 [IU] via SUBCUTANEOUS
  Filled 2020-06-07 (×7): qty 0.45

## 2020-06-07 MED ORDER — SODIUM CHLORIDE 0.9 % IV SOLN: INTRAVENOUS | Status: DC

## 2020-06-07 MED ORDER — PROSOURCE PLUS PO LIQD: 30.0000 mL | Freq: Two times a day (BID) | ORAL | Status: DC

## 2020-06-07 MED ORDER — AMLODIPINE BESYLATE 10 MG PO TABS
10.0000 mg | ORAL_TABLET | Freq: Every day | ORAL | Status: DC
  Administered 2020-06-07 – 2020-06-10 (×4): 10 mg via ORAL
  Filled 2020-06-07 (×4): qty 1

## 2020-06-07 MED ORDER — HYDRALAZINE HCL 20 MG/ML IJ SOLN: 10.0000 mg | Freq: Four times a day (QID) | INTRAMUSCULAR | Status: DC | PRN

## 2020-06-07 NOTE — H&P (View-Only) (Signed)
Patient ID: Patrick [name], male   DOB: August 12, 1965, 55 y.o.   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  Plan for return to the operating room tomorrow Wednesday for repeat debridement possible fourth ray amputation and possible wound closure.  Discussed that if the wound bed is not healthy and viable that we would need to return to the operating room again on Friday.  Patient states that he needs to go to his son's college graduation on Saturday.  Initial tissue cultures are showing gram-positive cocci and gram-positive rods.  Lab Results  Component Value Date   HGBA1C >15.5 (H) 06/06/2020   HGBA1C >15.5 (H) 06/04/2020   HGBA1C 12.4 (H) 05/03/2019   ESRSEDRATE 105 (H) 06/04/2020   CRP 24.4 (H) 06/04/2020   CRP 1.2 (H) 05/07/2019   CRP 1.8 (H) 05/06/2019   REPTSTATUS PENDING 06/06/2020   GRAMSTAIN  06/06/2020    RARE WBC PRESENT, PREDOMINANTLY PMN ABUNDANT GRAM POSITIVE COCCI RARE GRAM POSITIVE RODS Performed at [facility], [place] [address] [place], [place] [address]    CULT PENDING 06/06/2020    No results found for: CBC

## 2020-06-07 NOTE — Progress Notes (Signed)
Inpatient Rehab Admissions Coordinator:   Consult received.  Awaiting recommendations from therapy.    Shann Medal, PT, DPT Admissions Coordinator [phone] 06/07/20  1:01 PM

## 2020-06-07 NOTE — Progress Notes (Signed)
Test: CBG  Critical Value: 419  Name of Provider Notified: Loann Quill, MD  Orders Received? See MAR/orders Or Actions Taken?: Give 15 units of novolog per sliding scale.

## 2020-06-07 NOTE — Progress Notes (Signed)
Orthopedic Tech Progress Note Patient Details:  Patrick [name] [birth year] [id number]  Ortho Devices Type of Ortho Device: Postop shoe/boot Ortho Device/Splint Location: RLE Ortho Device/Splint Interventions: Ordered,Application   Post Interventions Patient Tolerated: Well Instructions Provided: Care of device   Janit Pagan 06/07/2020, 3:35 PM

## 2020-06-07 NOTE — Progress Notes (Signed)
Test: CBG  Critical Value: 431  Name of Provider Notified: Loann Quill, MD  Orders Received? None Or Actions Taken?: See MAR

## 2020-06-07 NOTE — Progress Notes (Signed)
PROGRESS NOTE    Patrick [name]  [id number] DOB: February 18, 1966 DOA: 06/04/2020 PCP: [facility]   Brief Narrative:  Patrick [name] is a 55 y.o. male with medical history significant of DM2, HTN.  Who presented to ED with wound to the right lateral foot for 2 weeks.  No fever.  Minimal pain. Has not been on insulins at all recently due to cost and no insurance until recently.  Upon arrival to ED, patient was septic with Tm 103.1, HR 122, RR 30, WBC 19.4k.  Lactic acid were normal.  Chest x-ray normal.  Covid negative.  He was admitted to hospital service and was started on cefepime Flagyl as well as vancomycin and orthopedics were consulted.  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  Status post ABI followed by right fifth ray amputation on 06/06/2020 by Dr. Sharol Given.  Scheduled for another surgery tomorrow.  Had high-grade fever up to 102.5 last evening.  Cultures remain negative.  Continue current antibiotics.  Defer further management to orthopedics.   AKI on CKD stage IIIa: Baseline creatinine around 1.5-1.7.  Presented with 2.14.  But now has been stable around 2.3.  Will discontinue IV fluids.  Avoid nephrotoxic agents.  Continue to hold metformin, hydrochlorothiazide and losartan.  Essential hypertension: Blood pressure slightly elevated.  Resume amlodipine but hold other antihypertensives.  Gout with multiple joints affected:  Complained of left elbow, right ring finger and left knee pain.  All of these joints are edematous and tender to palpation and warm as well.  Consistent with clinical findings of gout.  Today he feels much better.  Not as tender as he was yesterday.  Orthopedics on board.  Will defer to them.  Uncontrolled type 2 diabetes mellitus with hyperglycemia: Blood sugar significantly elevated.  Increase Levemir to 45 units twice daily and continue SSI.  DVT prophylaxis: SCDs Start: 06/04/20 1933   Code Status: Full Code  Family Communication: None present at bedside.  Plan of care discussed with patient in length and he verbalized understanding and agreed with it.  Status is: Inpatient  Remains inpatient appropriate because:Inpatient level of care appropriate due to severity of illness   Dispo: The patient is from: Home              Anticipated d/c is to: Home              Patient currently is not medically stable to d/c.   Difficult to place patient No    Estimated body mass index is 43.59 kg/m as calculated from the following:   Height as of this encounter: 6' (1.829 m).   Weight as of this encounter: 145.8 kg.      Nutritional status:  Nutrition Problem: Increased nutrient needs Etiology: wound healing   Signs/Symptoms: estimated needs   Interventions: MVI,Prostat,Premier Protein,Education    Consultants:   Orthopedics  Procedures:   None  Antimicrobials:  Anti-infectives (From admission,  onward)   Start     Dose/Rate Route Frequency Ordered Stop   06/07/20 2200  vancomycin (VANCOREADY) IVPB 1250 mg/250 mL        1,250 mg 166.7 mL/hr over 90 Minutes Intravenous Every 24 hours 06/07/20 1050     06/06/20 0600  ceFAZolin (ANCEF) IVPB 3g/100 mL premix        3 g 200 mL/hr over 30 Minutes Intravenous On call to O.R. 06/05/20 2230 06/06/20 1534   06/05/20 2000  vancomycin (VANCOREADY) IVPB 1500 mg/300 mL  Status:  Discontinued        1,500 mg 150 mL/hr over 120  Minutes Intravenous Every 24 hours 06/04/20 1736 06/07/20 1050   06/05/20 0200  piperacillin-tazobactam (ZOSYN) IVPB 3.375 g        3.375 g 12.5 mL/hr over 240 Minutes Intravenous Every 8 hours 06/04/20 2025     06/04/20 2200  ceFEPIme (MAXIPIME) 2 g in sodium chloride 0.9 % 100 mL IVPB  Status:  Discontinued        2 g 200 mL/hr over 30 Minutes Intravenous Every 12 hours 06/04/20 1736 06/04/20 1935   06/04/20 2015  clindamycin (CLEOCIN) IVPB 600 mg  Status:  Discontinued        600 mg 100 mL/hr over 30 Minutes Intravenous Every 8 hours 06/04/20 1935 06/07/20 1046   06/04/20 1730  vancomycin (VANCOREADY) IVPB 2000 mg/400 mL        2,000 mg 200 mL/hr over 120 Minutes Intravenous  Once 06/04/20 1717 06/04/20 2237   06/04/20 1715  ceFEPIme (MAXIPIME) 2 g in sodium chloride 0.9 % 100 mL IVPB        2 g 200 mL/hr over 30 Minutes Intravenous  Once 06/04/20 1712 06/04/20 1836   06/04/20 1715  metroNIDAZOLE (FLAGYL) IVPB 500 mg  Status:  Discontinued        500 mg 100 mL/hr over 60 Minutes Intravenous  Once 06/04/20 1712 06/04/20 1946   06/04/20 1715  vancomycin (VANCOREADY) IVPB 1000 mg/200 mL  Status:  Discontinued        1,000 mg 200 mL/hr over 60 Minutes Intravenous  Once 06/04/20 1712 06/04/20 1717         Subjective: Seen and examined.  Feels much better.  Left elbow, right ring finger and left knee pain has improved significantly.  No new complaint.  Objective: Vitals:   06/06/20 1641 06/06/20 1709 06/06/20 1843 06/07/20 0740  BP: (!) 149/86 (!) 150/89  (!) 142/93  Pulse: 92 92  (!) 105  Resp: (!) '26 12  20  '$ Temp: (!) 102.5 F (39.2 C) (!) 101 F (38.3 C) 100.3 F (37.9 C) 99.5 F (37.5 C)  TempSrc:  Oral  Oral  SpO2: 95% 97%  99%  Weight:      Height:        Intake/Output Summary (Last 24 hours) at 06/07/2020 1051 Last data filed at 06/07/2020 [id number] Gross per 24 hour  Intake 1090.05 ml  Output 3290 ml  Net -2199.95 ml   Filed Weights   06/04/20 1714  Weight: (!)  145.8 kg    Examination: General exam: Appears calm and comfortable, morbidly obese Respiratory system: Clear to auscultation. Respiratory effort normal. Cardiovascular system: S1 & S2 heard, RRR. No JVD, murmurs, rubs, gallops or clicks. No pedal edema. Gastrointestinal system: Abdomen is nondistended, soft and nontender. No organomegaly or masses felt. Normal bowel sounds heard. Central nervous system: Alert and oriented. No focal neurological deficits. Extremities: Dressing at right foot.  Very minimal swelling at left elbow, right ring finger and very minimal tenderness. Skin: No rashes, lesions or ulcers.  Psychiatry: Judgement and insight appear normal. Mood & affect appropriate.   Data Reviewed: I have personally reviewed following labs and imaging studies  CBC: Recent Labs  Lab 06/04/20 1626 06/05/20 0557 06/06/20 0353 06/07/20 0249  WBC 19.4* 18.6* 16.7* 20.9*  NEUTROABS 16.6*  --  13.1* 18.4*  HGB 9.5* 8.6* 8.3* 9.0*  HCT 30.5* 27.0* 26.7* 28.8*  MCV 86.2 84.9 85.3 85.2  PLT 439* 417* 398 [id number]*   Basic Metabolic Panel: Recent Labs  Lab 06/04/20 1626 06/05/20 0557 06/06/20 0353 06/07/20 0249  NA 129* 134* 131* 132*  K 4.4 3.8 3.9 4.7  CL 98 103 101 102  CO2 20* 23 20* 19*  GLUCOSE 393* 231* 223* 374*  BUN 28* 26* 25* 29*  CREATININE 2.14* 1.98* 2.33* 2.35*  CALCIUM 8.6* 8.5* 8.2* 8.5*  MG  --   --  1.3*  --    GFR: Estimated Creatinine Clearance: 53.9 mL/min (A) (by C-G formula based on SCr of 2.35 mg/dL (H)). Liver Function Tests: Recent Labs  Lab 06/04/20 1626 06/05/20 0557 06/06/20 0353 06/07/20 0249  AST '25 25 17 17  '$ ALT '19 20 19 17  '$ ALKPHOS 123 113 116 137*  BILITOT 0.9 0.8 0.9 1.0  PROT 7.1 6.1* 6.4* 6.2*  ALBUMIN 2.7* 2.3* 2.3* 2.1*   No results for input(s): LIPASE, AMYLASE in the last 168 hours. No results for input(s): AMMONIA in the last 168 hours. Coagulation Profile: Recent Labs  Lab 06/04/20 1756  INR 1.2   Cardiac  Enzymes: No results for input(s): CKTOTAL, CKMB, CKMBINDEX, TROPONINI in the last 168 hours. BNP (last 3 results) No results for input(s): PROBNP in the last 8760 hours. HbA1C: Recent Labs    06/04/20 2044  HGBA1C >15.5*   CBG: Recent Labs  Lab 06/05/20 2106 06/06/20 0805 06/06/20 1229 06/06/20 1621 06/07/20 0740  GLUCAP 264* 186* 163* 146* 419*   Lipid Profile: No results for input(s): CHOL, HDL, LDLCALC, TRIG, CHOLHDL, LDLDIRECT in the last 72 hours. Thyroid Function Tests: No results for input(s): TSH, T4TOTAL, FREET4, T3FREE, THYROIDAB in the last 72 hours. Anemia Panel: No results for input(s): VITAMINB12, FOLATE, FERRITIN, TIBC, IRON, RETICCTPCT in the last 72 hours. Sepsis Labs: Recent Labs  Lab 06/04/20 1626  LATICACIDVEN 1.5    Recent Results (from the past 240 hour(s))  SARS CORONAVIRUS 2 (TAT 6-24 HRS) Nasopharyngeal Nasopharyngeal Swab     Status: None   Collection Time: 06/04/20  4:26 PM   Specimen: Nasopharyngeal Swab  Result Value Ref Range Status   SARS Coronavirus 2 NEGATIVE NEGATIVE Final    Comment: (NOTE) SARS-CoV-2 target nucleic acids are NOT DETECTED.  The SARS-CoV-2 RNA is generally detectable in upper and lower respiratory specimens during the acute phase of infection. Negative results do not preclude SARS-CoV-2 infection, do not rule out co-infections with other pathogens, and should not be used as the sole basis for treatment or other patient management decisions. Negative results must be combined with clinical observations, patient history, and epidemiological information. The expected result is Negative.  Fact Sheet for Patients: SugarRoll.be  Fact Sheet for Healthcare Providers: https://www.woods-mathews.com/  This test is not yet approved or cleared by the [place] FDA and  has been authorized for detection and/or diagnosis of SARS-CoV-2 by FDA under an Emergency Use Authorization  (EUA). This EUA will remain  in effect (meaning this test can be used)  for the duration of the COVID-19 declaration under Se ction 564(b)(1) of the Act, 21 U.S.C. section 360bbb-3(b)(1), unless the authorization is terminated or revoked sooner.  Performed at [facility], [place] [address], [place], [place] [address]   Blood Culture (routine x 2)     Status: None (Preliminary result)   Collection Time: 06/04/20  5:55 PM   Specimen: BLOOD  Result Value Ref Range Status   Specimen Description BLOOD SITE NOT SPECIFIED  Final   Special Requests   Final    BOTTLES DRAWN AEROBIC AND ANAEROBIC Blood Culture results may not be optimal due to an inadequate volume of blood received in culture bottles   Culture   Final    NO GROWTH 3 DAYS Performed at [facility], [place] [address] [place], [place] [address]    Report Status PENDING  Incomplete  Blood Culture (routine x 2)     Status: None (Preliminary result)   Collection Time: 06/04/20  5:56 PM   Specimen: BLOOD  Result Value Ref Range Status   Specimen Description BLOOD SITE NOT SPECIFIED  Final   Special Requests   Final    BOTTLES DRAWN AEROBIC AND ANAEROBIC Blood Culture results may not be optimal due to an inadequate volume of blood received in culture bottles   Culture   Final    NO GROWTH 3 DAYS Performed at [facility], [place] [address], [place], [place] [address]    Report Status PENDING  Incomplete  Urine culture     Status: Abnormal   Collection Time: 06/05/20  3:35 AM   Specimen: In/Out Cath Urine  Result Value Ref Range Status   Specimen Description IN/OUT CATH URINE  Final   Special Requests   Final    NONE Performed at [facility], [place] [address], [place], [place] [address]    Culture MULTIPLE SPECIES PRESENT, SUGGEST RECOLLECTION (A)  Final   Report Status 06/06/2020 FINAL  Final  MRSA PCR Screening     Status: None   Collection Time: 06/05/20  7:51 AM   Specimen: Nasopharyngeal  Result Value  Ref Range Status   MRSA by PCR NEGATIVE NEGATIVE Final    Comment:        The GeneXpert MRSA Assay (FDA approved for NASAL specimens only), is one component of a comprehensive MRSA colonization surveillance program. It is not intended to diagnose MRSA infection nor to guide or monitor treatment for MRSA infections. Performed at [facility], [place] [address] [place], [place] [address]   Aerobic/Anaerobic Culture w Gram Stain (surgical/deep wound)     Status: None (Preliminary result)   Collection Time: 06/06/20  3:49 PM   Specimen: Abscess  Result Value Ref Range Status   Specimen Description ABSCESS  Final   Special Requests RIGHT FIFTH TOE (RAY)  Final   Gram Stain   Final    RARE WBC PRESENT, PREDOMINANTLY PMN ABUNDANT GRAM POSITIVE COCCI RARE GRAM POSITIVE RODS Performed at [facility], [place] [address], [place], [place] [address]    Culture PENDING  Incomplete   Report Status PENDING  Incomplete      Radiology Studies: No results found.  Scheduled Meds: . (feeding supplement) PROSource Plus  30 mL Oral BID BM  . colchicine  0.6 mg Oral BID  . feeding supplement (GLUCERNA SHAKE)  237 mL Oral TID BM  . insulin aspart  0-15 Units Subcutaneous TID WC  . insulin aspart  0-5 Units Subcutaneous QHS  . insulin aspart  8 Units Subcutaneous TID WC  . insulin detemir  45 Units Subcutaneous BID  . multivitamin with minerals  1 tablet Oral Daily   Continuous Infusions: . sodium chloride 125 mL/hr at 06/07/20 0658  . piperacillin-tazobactam (ZOSYN)  IV 3.375 g (06/07/20 0831)  . vancomycin       LOS: 3 days   Time spent: 30 minutes   Darliss Cheney, MD [facility]  06/07/2020, 10:51 AM   To contact the attending provider between 7A-7P or the covering provider during after hours 7P-7A, please log into the web site [email or web address].

## 2020-06-07 NOTE — Progress Notes (Addendum)
Patient ID: Patrick [name], male   DOB: 10/26/1965, 55 y.o.   MRN: [id number] Patient is seen in follow-up status post fifth ray amputation and multiple compartment debridement for extensive abscess dorsum of his right foot.  Plan for return to the operating room tomorrow Wednesday for repeat debridement possible fourth ray amputation and possible wound closure.  Discussed that if the wound bed is not healthy and viable that we would need to return to the operating room again on Friday.  Patient states that he needs to go to his son's college graduation on Saturday.  Initial tissue cultures are showing gram-positive cocci and gram-positive rods.  Lab Results  Component Value Date   HGBA1C >15.5 (H) 06/06/2020   HGBA1C >15.5 (H) 06/04/2020   HGBA1C 12.4 (H) 05/03/2019   ESRSEDRATE 105 (H) 06/04/2020   CRP 24.4 (H) 06/04/2020   CRP 1.2 (H) 05/07/2019   CRP 1.8 (H) 05/06/2019   REPTSTATUS PENDING 06/06/2020   GRAMSTAIN  06/06/2020    RARE WBC PRESENT, PREDOMINANTLY PMN ABUNDANT GRAM POSITIVE COCCI RARE GRAM POSITIVE RODS Performed at [facility], [place] [address] [place], [place] [address]    CULT PENDING 06/06/2020    No results found for: CBC

## 2020-06-07 NOTE — Progress Notes (Addendum)
Inpatient Diabetes Program Recommendations  AACE/ADA: New Consensus Statement on Inpatient Glycemic Control (2015)  Target Ranges:  Prepandial:   less than 140 mg/dL      Peak postprandial:   less than 180 mg/dL (1-2 hours)      Critically ill patients:  140 - 180 mg/dL   Lab Results  Component Value Date   GLUCAP 431 (H) 06/07/2020   HGBA1C >15.5 (H) 06/06/2020    Review of Glycemic Control Results for Patrick, [name] (MRN [id number]) as of 06/07/2020 13:35  Ref. Range 06/06/2020 08:05 06/06/2020 12:29 06/06/2020 16:21 06/07/2020 07:40 06/07/2020 12:01  Glucose-Capillary Latest Ref Range: 70 - 99 mg/dL 186 (H) 163 (H) 146 (H) 419 (H) 431 (H)   Inpatient Diabetes Program Recommendations:   Spoke with pt about A1C results >15.2 (average blood glucose > 398 over the past 2-3 months) and explained what an A1C is, basic pathophysiology of DM Type 2, basic home care, basic diabetes diet nutrition principles, importance of checking CBGs and maintaining good CBG control to prevent long-term and short-term complications. Reviewed signs and symptoms of hyperglycemia and hypoglycemia and how to treat hypoglycemia at home. Also reviewed blood sugar goals at home.  RNs to provide ongoing basic DM education at bedside with this patient. Have ordered educational booklet and insulin starter kit.  Patient states he now has insurance and will be able to afford his insulin prescriptions. Reviewed with patient need to discuss with PCP insulin available @ [facility] should he be in need of affordable insulin again.  Reeducated patient on insulin pen use at home. Reviewed contents of insulin flexpen starter kit. Reviewed all steps if insulin pen including attachment of needle, 2-unit air shot, dialing up dose, giving injection, removing needle, disposal of sharps, storage of unused insulin, disposal of insulin etc. MD to give patient Rxs for insulin pens and insulin pen needles.  Thank you, Nani Gasser. Xavi Tomasik, RN,  MSN, CDE  Diabetes Coordinator Inpatient Glycemic Control Team Team Pager [phone] (8am-5pm) 06/07/2020 1:50 PM

## 2020-06-07 NOTE — Progress Notes (Signed)
Pharmacy Antibiotic Note  Patrick [name] is a 55 y.o. male admitted on 06/04/2020 with R- foot wound with gas, possible necrotizing fascitis.  Pharmacy has been consulted for piperacillin/tazobactam and vancomycin dosing. Patient is also on clindamycin per MD.   WBC 21, afebrile. Renal function worsened, clcr ~32m/min. S/p 5th toe amputation and I&D, planning further debriedment and wound closure 4/20.   4/19 Vancomycin '1250mg'$  Q 24 hr Scr used: 2.35 mg/dL Weight: 146 kg Vd coeff: 0.5 L/kg Est AUC: 456 mcg-h/mL   Plan: Piperacillin/tazobactam 3.375g Q 8 hr EI  Decrease Vancomycin to 1250 mg IV Q 24 hrs  Clindamycin '600mg'$  Q8hr per MD Monitor cultures, clinical status, renal fx, vanc level if continuing Narrow abx as able and f/u duration    Height: 6' (182.9 cm) Weight: (!) 145.8 kg (321 lb 6.9 oz) IBW/kg (Calculated) : 77.6  Temp (24hrs), Avg:100.3 F (37.9 C), Min:97.8 F (36.6 C), Max:102.5 F (39.2 C)  Recent Labs  Lab 06/04/20 1626 06/05/20 0557 06/06/20 0353 06/07/20 0249  WBC 19.4* 18.6* 16.7* 20.9*  CREATININE 2.14* 1.98* 2.33* 2.35*  LATICACIDVEN 1.5  --   --   --     Estimated Creatinine Clearance: 53.9 mL/min (A) (by C-G formula based on SCr of 2.35 mg/dL (H)).    No Known Allergies  Antimicrobials this admission: Vanc 4/16 >>  Piptazo 4/16 >> Clinda 4/16 >> MTZ 4/16   Cefepime 4/16    Microbiology results: 4/16 BCx: ngtd 4/16 UCx: multi species 4/17 MRSA neg  4/18 OR cx: abundant GPC, Rare GPR  Thank you for allowing pharmacy to be a part of this patient's care.  LBenetta Spar PharmD, BCPS, BCCP Clinical Pharmacist  Please check AMION for all MLyonphone numbers After 10:00 PM, call [phone]

## 2020-06-07 NOTE — Plan of Care (Signed)

## 2020-06-07 NOTE — Progress Notes (Signed)
Test: CBG Critical Value: 408  Name of Provider Notified: Loann Quill, MD  Orders Received? None Or Actions Taken?: none

## 2020-06-08 ENCOUNTER — Encounter (HOSPITAL_COMMUNITY)
Admission: EM | Disposition: A | Discharge status: Home Health | Payer: Self-pay | Source: Home / Self Care | Attending: Family Medicine

## 2020-06-08 ENCOUNTER — Inpatient Hospital Stay (HOSPITAL_COMMUNITY): Payer: 59 | Admitting: Certified Registered Nurse Anesthetist

## 2020-06-08 ENCOUNTER — Encounter (HOSPITAL_COMMUNITY): Payer: Self-pay | Admitting: Orthopedic Surgery

## 2020-06-08 DIAGNOSIS — M869 Osteomyelitis, unspecified: Secondary | ICD-10-CM | POA: Diagnosis not present

## 2020-06-08 DIAGNOSIS — A480 Gas gangrene: Secondary | ICD-10-CM | POA: Diagnosis not present

## 2020-06-08 HISTORY — PX: AMPUTATION: SHX166

## 2020-06-08 LAB — CBC WITH DIFFERENTIAL/PLATELET
Abs Immature Granulocytes: 0.29 10*3/uL — ABNORMAL HIGH (ref 0.00–0.07)
Basophils Absolute: 0 10*3/uL (ref 0.0–0.1)
Basophils Relative: 0 %
Eosinophils Absolute: 0 10*3/uL (ref 0.0–0.5)
Eosinophils Relative: 0 %
HCT: 27.5 % — ABNORMAL LOW (ref 39.0–52.0)
Hemoglobin: 8.6 g/dL — ABNORMAL LOW (ref 13.0–17.0)
Immature Granulocytes: 2 %
Lymphocytes Relative: 10 %
Lymphs Abs: 2.1 10*3/uL (ref 0.7–4.0)
MCH: 26.3 pg (ref 26.0–34.0)
MCHC: 31.3 g/dL (ref 30.0–36.0)
MCV: 84.1 fL (ref 80.0–100.0)
Monocytes Absolute: 1.7 10*3/uL — ABNORMAL HIGH (ref 0.1–1.0)
Monocytes Relative: 8 %
Neutro Abs: 15.8 10*3/uL — ABNORMAL HIGH (ref 1.7–7.7)
Neutrophils Relative %: 80 %
Platelets: 470 10*3/uL — ABNORMAL HIGH (ref 150–400)
RBC: 3.27 MIL/uL — ABNORMAL LOW (ref 4.22–5.81)
RDW: 13.6 % (ref 11.5–15.5)
WBC: 19.8 10*3/uL — ABNORMAL HIGH (ref 4.0–10.5)
nRBC: 0 % (ref 0.0–0.2)

## 2020-06-08 LAB — GLUCOSE, CAPILLARY
Glucose-Capillary: 214 mg/dL — ABNORMAL HIGH (ref 70–99)
Glucose-Capillary: 167 mg/dL — ABNORMAL HIGH (ref 70–99)
Glucose-Capillary: 135 mg/dL — ABNORMAL HIGH (ref 70–99)
Glucose-Capillary: 116 mg/dL — ABNORMAL HIGH (ref 70–99)
Glucose-Capillary: 251 mg/dL — ABNORMAL HIGH (ref 70–99)

## 2020-06-08 LAB — BASIC METABOLIC PANEL
Anion gap: 9 (ref 5–15)
BUN: 39 mg/dL — ABNORMAL HIGH (ref 6–20)
CO2: 20 mmol/L — ABNORMAL LOW (ref 22–32)
Calcium: 8.9 mg/dL (ref 8.9–10.3)
Chloride: 104 mmol/L (ref 98–111)
Creatinine, Ser: 2 mg/dL — ABNORMAL HIGH (ref 0.61–1.24)
GFR, Estimated: 39 mL/min — ABNORMAL LOW (ref >60)
Glucose, Bld: 259 mg/dL — ABNORMAL HIGH (ref 70–99)
Potassium: 4.5 mmol/L (ref 3.5–5.1)
Sodium: 133 mmol/L — ABNORMAL LOW (ref 135–145)

## 2020-06-08 SURGERY — AMPUTATION, FOOT, RAY
Anesthesia: Monitor Anesthesia Care | Laterality: Right

## 2020-06-08 MED ORDER — ROPIVACAINE HCL 5 MG/ML IJ SOLN
INTRAMUSCULAR | Status: DC | PRN
  Administered 2020-06-08: 30 mL via PERINEURAL

## 2020-06-08 MED ORDER — DEXAMETHASONE SODIUM PHOSPHATE 10 MG/ML IJ SOLN
INTRAMUSCULAR | Status: DC | PRN
  Administered 2020-06-08: 5 mg

## 2020-06-08 MED ORDER — CHLORHEXIDINE GLUCONATE 0.12 % MT SOLN
15.0000 mL | OROMUCOSAL | Status: AC
  Filled 2020-06-08: qty 15

## 2020-06-08 MED ORDER — CHLORHEXIDINE GLUCONATE 0.12 % MT SOLN: 15.0000 mL | Freq: Once | OROMUCOSAL | Status: DC

## 2020-06-08 MED ORDER — ORAL CARE MOUTH RINSE: 15.0000 mL | Freq: Once | OROMUCOSAL | Status: DC

## 2020-06-08 MED ORDER — MIDAZOLAM HCL 2 MG/2ML IJ SOLN
INTRAMUSCULAR | Status: AC
  Administered 2020-06-08: 2 mg via INTRAVENOUS
  Filled 2020-06-08: qty 2

## 2020-06-08 MED ORDER — CELECOXIB 200 MG PO CAPS
200.0000 mg | ORAL_CAPSULE | Freq: Once | ORAL | Status: AC
  Administered 2020-06-08: 200 mg via ORAL
  Filled 2020-06-08: qty 1

## 2020-06-08 MED ORDER — SODIUM CHLORIDE 0.9 % IV SOLN: INTRAVENOUS | Status: DC

## 2020-06-08 MED ORDER — CEFAZOLIN IN SODIUM CHLORIDE 3-0.9 GM/100ML-% IV SOLN: 3.0000 g | INTRAVENOUS | Status: DC

## 2020-06-08 MED ORDER — SODIUM CHLORIDE 0.9 % IV SOLN
2.0000 g | Freq: Three times a day (TID) | INTRAVENOUS | Status: DC
  Administered 2020-06-08 – 2020-06-10 (×7): 2 g via INTRAVENOUS
  Filled 2020-06-08 (×9): qty 2

## 2020-06-08 MED ORDER — MIDAZOLAM HCL 2 MG/2ML IJ SOLN: 2.0000 mg | Freq: Once | INTRAMUSCULAR | Status: AC

## 2020-06-08 MED ORDER — ONDANSETRON HCL 4 MG/2ML IJ SOLN
INTRAMUSCULAR | Status: DC | PRN
  Administered 2020-06-08: 4 mg via INTRAVENOUS

## 2020-06-08 MED ORDER — MIDAZOLAM HCL 2 MG/2ML IJ SOLN
INTRAMUSCULAR | Status: AC
  Filled 2020-06-08: qty 2

## 2020-06-08 MED ORDER — LACTATED RINGERS IV SOLN: INTRAVENOUS | Status: DC

## 2020-06-08 MED ORDER — FENTANYL CITRATE (PF) 100 MCG/2ML IJ SOLN: 25.0000 ug | INTRAMUSCULAR | Status: DC | PRN

## 2020-06-08 MED ORDER — CHLORHEXIDINE GLUCONATE 0.12 % MT SOLN
OROMUCOSAL | Status: AC
  Administered 2020-06-08: 15 mL via OROMUCOSAL
  Filled 2020-06-08: qty 15

## 2020-06-08 MED ORDER — OXYCODONE HCL 5 MG PO TABS: 5.0000 mg | ORAL_TABLET | Freq: Once | ORAL | Status: DC | PRN

## 2020-06-08 MED ORDER — MIDAZOLAM HCL 2 MG/2ML IJ SOLN
INTRAMUSCULAR | Status: DC | PRN
  Administered 2020-06-08: 2 mg via INTRAVENOUS

## 2020-06-08 MED ORDER — FENTANYL CITRATE (PF) 100 MCG/2ML IJ SOLN
INTRAMUSCULAR | Status: AC
  Administered 2020-06-08: 100 ug via INTRAVENOUS
  Filled 2020-06-08: qty 2

## 2020-06-08 MED ORDER — FENTANYL CITRATE (PF) 250 MCG/5ML IJ SOLN
INTRAMUSCULAR | Status: AC
  Filled 2020-06-08: qty 5

## 2020-06-08 MED ORDER — 0.9 % SODIUM CHLORIDE (POUR BTL) OPTIME
TOPICAL | Status: DC | PRN
  Administered 2020-06-08: 1000 mL

## 2020-06-08 MED ORDER — PROPOFOL 10 MG/ML IV BOLUS
INTRAVENOUS | Status: DC | PRN
  Administered 2020-06-08 (×3): 20 mg via INTRAVENOUS
  Administered 2020-06-08: 50 mg via INTRAVENOUS
  Administered 2020-06-08 (×3): 20 mg via INTRAVENOUS

## 2020-06-08 MED ORDER — PROMETHAZINE HCL 25 MG/ML IJ SOLN: 6.2500 mg | INTRAMUSCULAR | Status: DC | PRN

## 2020-06-08 MED ORDER — OXYCODONE HCL 5 MG/5ML PO SOLN
5.0000 mg | Freq: Once | ORAL | Status: DC | PRN
Start: 2020-06-08 — End: 2020-06-08

## 2020-06-08 MED ORDER — FENTANYL CITRATE (PF) 100 MCG/2ML IJ SOLN: 100.0000 ug | Freq: Once | INTRAMUSCULAR | Status: AC

## 2020-06-08 MED ORDER — ACETAMINOPHEN 500 MG PO TABS
1000.0000 mg | ORAL_TABLET | Freq: Once | ORAL | Status: AC
  Administered 2020-06-08: 1000 mg via ORAL
  Filled 2020-06-08: qty 2

## 2020-06-08 SURGICAL SUPPLY — 29 items
BLADE SAW SGTL MED 73X18.5 STR (BLADE) IMPLANT
BLADE SURG 21 STRL SS (BLADE) ×2 IMPLANT
BNDG COHESIVE 4X5 TAN STRL (GAUZE/BANDAGES/DRESSINGS) ×2 IMPLANT
BNDG GAUZE ELAST 4 BULKY (GAUZE/BANDAGES/DRESSINGS) ×2 IMPLANT
COVER SURGICAL LIGHT HANDLE (MISCELLANEOUS) ×4 IMPLANT
COVER WAND RF STERILE (DRAPES) ×2 IMPLANT
DRAPE U-SHAPE 47X51 STRL (DRAPES) ×4 IMPLANT
DRSG ADAPTIC 3X8 NADH LF (GAUZE/BANDAGES/DRESSINGS) ×2 IMPLANT
DRSG PAD ABDOMINAL 8X10 ST (GAUZE/BANDAGES/DRESSINGS) ×4 IMPLANT
DURAPREP 26ML APPLICATOR (WOUND CARE) ×2 IMPLANT
ELECT REM PT RETURN 9FT ADLT (ELECTROSURGICAL) ×2
ELECTRODE REM PT RTRN 9FT ADLT (ELECTROSURGICAL) ×1 IMPLANT
GAUZE SPONGE 4X4 12PLY STRL (GAUZE/BANDAGES/DRESSINGS) ×2 IMPLANT
GLOVE BIOGEL PI IND STRL 9 (GLOVE) ×1 IMPLANT
GLOVE BIOGEL PI INDICATOR 9 (GLOVE) ×1
GLOVE SURG ORTHO 9.0 STRL STRW (GLOVE) ×2 IMPLANT
GOWN STRL REUS W/ TWL XL LVL3 (GOWN DISPOSABLE) ×2 IMPLANT
GOWN STRL REUS W/TWL XL LVL3 (GOWN DISPOSABLE) ×4
GRAFT SKIN WND OMEGA3 3X7 (Tissue) ×1 IMPLANT
KIT BASIN OR (CUSTOM PROCEDURE TRAY) ×2 IMPLANT
KIT TURNOVER KIT B (KITS) ×2 IMPLANT
NS IRRIG 1000ML POUR BTL (IV SOLUTION) ×2 IMPLANT
PACK ORTHO EXTREMITY (CUSTOM PROCEDURE TRAY) ×2 IMPLANT
PAD ARMBOARD 7.5X6 YLW CONV (MISCELLANEOUS) ×4 IMPLANT
STOCKINETTE IMPERVIOUS LG (DRAPES) IMPLANT
SUT ETHILON 2 0 PSLX (SUTURE) ×2 IMPLANT
TOWEL GREEN STERILE (TOWEL DISPOSABLE) ×2 IMPLANT
TUBE CONNECTING 12X1/4 (SUCTIONS) ×2 IMPLANT
YANKAUER SUCT BULB TIP NO VENT (SUCTIONS) ×2 IMPLANT

## 2020-06-08 NOTE — Transfer of Care (Signed)
Immediate Anesthesia Transfer of Care Note  Patient: Patrick [name]  Procedure(s) Performed: RIGHT 4TH RAY AMPUTATION (Right )  Patient Location: PACU  Anesthesia Type:MAC and MAC combined with regional for post-op pain  Level of Consciousness: drowsy and patient cooperative  Airway & Oxygen Therapy: Patient Spontanous Breathing and Patient connected to nasal cannula oxygen  Post-op Assessment: Report given to RN, Post -op Vital signs reviewed and stable and Patient moving all extremities X 4  Post vital signs: Reviewed and stable  Last Vitals:  Vitals Value Taken Time  BP 126/84 06/08/20 1446  Temp    Pulse 106 06/08/20 1450  Resp 22 06/08/20 1450  SpO2 100 % 06/08/20 1450  Vitals shown include unvalidated device data.  Last Pain:  Vitals:   06/08/20 1202  TempSrc: Oral  PainSc:       Patients Stated Pain Goal: 2 (68/37/29 0211)  Complications: No complications documented.

## 2020-06-08 NOTE — Anesthesia Preprocedure Evaluation (Signed)
Anesthesia Evaluation  Patient identified by MRN, date of birth, ID band Patient awake    Reviewed: Allergy & Precautions, NPO status , Patient's Chart, lab work & pertinent test results  Airway Mallampati: III  TM Distance: >3 FB Neck ROM: Full    Dental  (+) Teeth Intact   Pulmonary neg pulmonary ROS,    Pulmonary exam normal        Cardiovascular hypertension, Pt. on medications  Rhythm:Regular Rate:Tachycardia     Neuro/Psych negative neurological ROS  negative psych ROS   GI/Hepatic negative GI ROS, Neg liver ROS,   Endo/Other  diabetes, Poorly Controlled, Type 2, Insulin Dependent, Oral Hypoglycemic AgentsMorbid obesity  Renal/GU negative Renal ROS     Musculoskeletal Right foot abscess   Abdominal (+)  Abdomen: soft. Bowel sounds: normal.  Peds  Hematology negative hematology ROS (+)   Anesthesia Other Findings   Reproductive/Obstetrics                             Anesthesia Physical Anesthesia Plan  ASA: III  Anesthesia Plan: MAC and Regional   Post-op Pain Management:  Regional for Post-op pain   Induction: Intravenous  PONV Risk Score and Plan: 1 and Ondansetron, Dexamethasone, Midazolam, Propofol infusion and Treatment may vary due to age or medical condition  Airway Management Planned: Simple Face Mask, Natural Airway and Nasal Cannula  Additional Equipment: None  Intra-op Plan:   Post-operative Plan:   Informed Consent: I have reviewed the patients History and Physical, chart, labs and discussed the procedure including the risks, benefits and alternatives for the proposed anesthesia with the patient or authorized representative who has indicated his/her understanding and acceptance.     Dental advisory given  Plan Discussed with: CRNA  Anesthesia Plan Comments: (Lab Results      Component                Value               Date                      WBC                       19.8 (H)            06/08/2020                HGB                      8.6 (L)             06/08/2020                HCT                      27.5 (L)            06/08/2020                MCV                      84.1                06/08/2020                PLT  470 (H)             06/08/2020           Lab Results      Component                Value               Date                      NA                       133 (L)             06/08/2020                K                        4.5                 06/08/2020                CO2                      20 (L)              06/08/2020                GLUCOSE                  259 (H)             06/08/2020                BUN                      39 (H)              06/08/2020                CREATININE               2.00 (H)            06/08/2020                CALCIUM                  8.9                 06/08/2020                GFRNONAA                 39 (L)              06/08/2020                GFRAA                    52 (L)              05/07/2019          )        Anesthesia Quick Evaluation

## 2020-06-08 NOTE — Progress Notes (Signed)
PT Cancellation Note  Patient Details Name: Patrick [name] MRN: [id number] DOB: 04-22-1965   Cancelled Treatment:    Reason Eval/Treat Not Completed: Patient at procedure or test/unavailable (Pt having toe amputation today. Will follow up tomorrow.)   Alvira Philips 06/08/2020, 12:39 PM  Muhammed Teutsch M,PT Acute Rehab Services [phone] (pager)

## 2020-06-08 NOTE — Anesthesia Postprocedure Evaluation (Signed)
Anesthesia Post Note  Patient: Patrick [name]  Procedure(s) Performed: RIGHT 4TH RAY AMPUTATION (Right )     Patient location during evaluation: PACU Anesthesia Type: Regional and MAC Level of consciousness: awake and alert Pain management: pain level controlled Vital Signs Assessment: post-procedure vital signs reviewed and stable Respiratory status: spontaneous breathing, nonlabored ventilation, respiratory function stable and patient connected to nasal cannula oxygen Cardiovascular status: stable and blood pressure returned to baseline Postop Assessment: no apparent nausea or vomiting Anesthetic complications: no   No complications documented.  Last Vitals:  Vitals:   06/08/20 1515 06/08/20 1605  BP: (!) 145/92 (!) 145/89  Pulse: (!) 107 (!) 110  Resp: 20 19  Temp: 36.8 C 36.7 C  SpO2: 98% 99%    Last Pain:  Vitals:   06/08/20 1605  TempSrc: Oral  PainSc:                  March Rummage Xana Bradt

## 2020-06-08 NOTE — Progress Notes (Signed)
PROGRESS NOTE    Patrick [name]  WUJ:[id number] DOB: 1965-07-13 DOA: 06/04/2020 PCP: [facility]   Brief Narrative:  Patrick [name] is a 55 y.o. male with medical history significant of DM2, HTN.  Who presented to ED with wound to the right lateral foot for 2 weeks.  No fever.  Minimal pain. Has not been on insulins at all recently due to cost and no insurance until recently.  Upon arrival to ED, patient was septic with Tm 103.1, HR 122, RR 30, WBC 19.4k.  Lactic acid were normal.  Chest x-ray normal.  Covid negative.  He was admitted to hospital service and was started on cefepime Flagyl as well as vancomycin and orthopedics were consulted.  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  Status post ABI followed by right fifth ray amputation on 06/06/2020 by Dr. Sharol Given.  Scheduled for another surgery today.  Had high-grade fever up to 102.5 on the evening of 06/06/2020 after the surgery.  Blood culture remain negative.  Culture from the foot is growing gram-positive cocci and rods . continue current antibiotics which is cefepime and vancomycin.  Defer further management to orthopedics.   AKI on CKD stage IIIa: Baseline creatinine around 1.5-1.7.  Presented with 2.14.  Got worse with a peak of 2.35 but improved today to 2.0.  Avoid nephrotoxic agents.  Continue to hold metformin, hydrochlorothiazide and losartan.  Essential hypertension: Blood  pressure slightly elevated.  Resume amlodipine but hold other antihypertensives.  Gout with multiple joints affected: Complained of left elbow, right ring finger and left knee pain.  All of these joints are edematous and tender to palpation and warm as well.  Consistent with clinical findings of gout. However over the course of however over the course of last 2 days, he has improved. Orthopedics on board.  Will defer to them.  Uncontrolled type 2 diabetes mellitus with hyperglycemia: Blood sugar significantly elevated.  However due to him being n.p.o., I will continue current dose of Levemir to 45 units twice daily and continue SSI.  DVT prophylaxis: SCD's Start: 06/07/20 1054 SCDs Start: 06/04/20 1933   Code Status: Full Code  Family Communication: None present at bedside.  Plan of care discussed with patient in length and he verbalized understanding and agreed with it.  Status is: Inpatient  Remains inpatient appropriate because:Inpatient level of care appropriate due to severity of illness   Dispo: The patient is from: Home              Anticipated d/c is to: Home              Patient currently is not medically stable to d/c.   Difficult to place patient No    Estimated body mass index is 43.59 kg/m as calculated from the following:   Height as of this encounter: 6' (1.829 m).   Weight as of this encounter: 145.8 kg.  Nutritional status:  Nutrition Problem: Increased nutrient needs Etiology: wound healing   Signs/Symptoms: estimated needs   Interventions: MVI,Prostat,Premier Protein,Education    Consultants:   Orthopedics  Procedures:   As above  Antimicrobials:  Anti-infectives (From admission, onward)   Start     Dose/Rate Route Frequency Ordered Stop   06/08/20 0815  ceFAZolin (ANCEF) IVPB 3g/100 mL premix  Status:  Discontinued        3 g 200 mL/hr over 30 Minutes Intravenous On call to O.R. 06/08/20 0728 06/08/20 0730   06/08/20 0600  ceFAZolin  (ANCEF) IVPB 3g/100 mL premix  Status:  Discontinued        3 g 200 mL/hr over 30 Minutes Intravenous On call to O.R. 06/07/20 1233 06/08/20 1051   06/07/20 2200  vancomycin (VANCOREADY) IVPB 1250 mg/250 mL        1,250 mg 166.7 mL/hr over 90 Minutes Intravenous Every 24 hours 06/07/20 1050     06/07/20 1800  metroNIDAZOLE (FLAGYL) IVPB 500 mg        500 mg 100 mL/hr over 60 Minutes Intravenous Every 8 hours 06/07/20 1542     06/07/20 1800  ceFEPIme (MAXIPIME) 2 g in sodium chloride 0.9 % 100 mL IVPB        2 g 200 mL/hr over 30 Minutes Intravenous Every 12 hours 06/07/20 1542     06/06/20 0600  ceFAZolin (ANCEF) IVPB 3g/100 mL premix        3 g 200 mL/hr over 30 Minutes Intravenous On call to O.R. 06/05/20 2230 06/06/20 1534   06/05/20 2000  vancomycin (VANCOREADY) IVPB 1500 mg/300 mL  Status:  Discontinued        1,500 mg 150 mL/hr over 120 Minutes Intravenous Every 24 hours 06/04/20 1736 06/07/20 1050   06/05/20 0200  piperacillin-tazobactam (ZOSYN) IVPB 3.375 g  Status:  Discontinued        3.375 g 12.5 mL/hr over 240 Minutes Intravenous Every 8 hours 06/04/20 2025 06/07/20 1542   06/04/20 2200  ceFEPIme (MAXIPIME) 2 g in sodium chloride 0.9 % 100 mL IVPB  Status:  Discontinued        2 g 200 mL/hr over 30 Minutes Intravenous Every 12 hours 06/04/20 1736 06/04/20 1935   06/04/20 2015  clindamycin (CLEOCIN) IVPB 600 mg  Status:  Discontinued        600 mg 100 mL/hr over 30 Minutes Intravenous Every 8 hours 06/04/20 1935 06/07/20 1046   06/04/20 1730  vancomycin (VANCOREADY) IVPB 2000 mg/400 mL        2,000 mg 200 mL/hr over 120 Minutes Intravenous  Once 06/04/20 1717 06/04/20 2237   06/04/20 1715  ceFEPIme (MAXIPIME) 2 g in sodium chloride 0.9 % 100 mL IVPB        2 g 200 mL/hr over 30 Minutes Intravenous  Once 06/04/20 1712 06/04/20 1836   06/04/20 1715  metroNIDAZOLE (FLAGYL) IVPB 500 mg  Status:  Discontinued        500 mg 100 mL/hr over 60 Minutes Intravenous  Once 06/04/20  1712 06/04/20 1946   06/04/20 1715  vancomycin (VANCOREADY) IVPB 1000 mg/200 mL  Status:  Discontinued        1,000 mg 200 mL/hr over 60 Minutes Intravenous  Once 06/04/20 1712 06/04/20 1717         Subjective: Seen and examined.  Complains of chronic cough but no other complaint.  Slightly drowsy today as he did not have a good sleep last night.  Objective: Vitals:  06/07/20 2032 06/08/20 0415 06/08/20 0813 06/08/20 0947  BP: 130/90 118/89 (!) 112/101 133/84  Pulse: (!) 111 (!) 114 (!) 116 (!) 110  Resp: _0 Temp: 99.1 F (37.3 C) 99.4 F (37.4 C)  98 F (36.7 C)  TempSrc: Oral Oral  Oral  SpO2: 97% 98% 96% 95%  Weight:      Height:        Intake/Output Summary (Last 24 hours) at 06/08/2020 1120 Last data filed at 06/08/2020 0600 Gross per 24 hour  Intake 2587.38 ml  Output 950 ml  Net 1637.38 ml   Filed Weights   06/04/20 1714  Weight: (!) 145.8 kg    Examination: General exam: Appears calm and comfortable, morbidly obese Respiratory system: Clear to auscultation. Respiratory effort normal. Cardiovascular system: S1 & S2 heard, RRR. No JVD, murmurs, rubs, gallops or clicks. No pedal edema. Gastrointestinal system: Abdomen is nondistended, soft and nontender. No organomegaly or masses felt. Normal bowel sounds heard. Central nervous system: Alert and oriented. No focal neurological deficits. Extremities: Dressing in the right foot. Skin: No rashes, lesions or ulcers.  Psychiatry: Judgement and insight appear normal. Mood & affect appropriate.   Data Reviewed: I have personally reviewed following labs and imaging studies  CBC: Recent Labs  Lab 06/04/20 1626 06/05/20 0557 06/06/20 0353 06/07/20 0249 06/08/20 0427  WBC 19.4* 18.6* 16.7* 20.9* 19.8*  NEUTROABS 16.6*  --  13.1* 18.4* 15.8*  HGB 9.5* 8.6* 8.3* 9.0* 8.6*  HCT 30.5* 27.0* 26.7* 28.8* 27.5*  MCV 86.2 84.9 85.3 85.2 84.1  PLT 439* 417* 398 437* 643*   Basic Metabolic Panel: Recent  Labs  Lab 06/04/20 1626 06/05/20 0557 06/06/20 0353 06/07/20 0249 06/08/20 0427  NA 129* 134* 131* 132* 133*  K 4.4 3.8 3.9 4.7 4.5  CL 98 103 101 102 104  CO2 20* 23 20* 19* 20*  GLUCOSE 393* 231* 223* 374* 259*  BUN 28* 26* 25* 29* 39*  CREATININE 2.14* 1.98* 2.33* 2.35* 2.00*  CALCIUM 8.6* 8.5* 8.2* 8.5* 8.9  MG  --   --  1.3*  --   --    GFR: Estimated Creatinine Clearance: 63.4 mL/min (A) (by C-G formula based on SCr of 2 mg/dL (H)). Liver Function Tests: Recent Labs  Lab 06/04/20 1626 06/05/20 0557 06/06/20 0353 06/07/20 0249  AST _1 ALT _2 ALKPHOS 123 113 116 137*  BILITOT 0.9 0.8 0.9 1.0  PROT 7.1 6.1* 6.4* 6.2*  ALBUMIN 2.7* 2.3* 2.3* 2.1*   No results for input(s): LIPASE, AMYLASE in the last 168 hours. No results for input(s): AMMONIA in the last 168 hours. Coagulation Profile: Recent Labs  Lab 06/04/20 1756  INR 1.2   Cardiac Enzymes: No results for input(s): CKTOTAL, CKMB, CKMBINDEX, TROPONINI in the last 168 hours. BNP (last 3 results) No results for input(s): PROBNP in the last 8760 hours. HbA1C: Recent Labs    06/06/20 0353  HGBA1C >15.5*   CBG: Recent Labs  Lab 06/07/20 0740 06/07/20 1201 06/07/20 1607 06/07/20 2036 06/08/20 0816  GLUCAP 419* 431* 408* 270* 214*   Lipid Profile: No results for input(s): CHOL, HDL, LDLCALC, TRIG, CHOLHDL, LDLDIRECT in the last 72 hours. Thyroid Function Tests: No results for input(s): TSH, T4TOTAL, FREET4, T3FREE, THYROIDAB in the last 72 hours. Anemia Panel: No results for input(s): VITAMINB12, FOLATE, FERRITIN, TIBC, IRON, RETICCTPCT in the last 72 hours. Sepsis Labs: Recent Labs  Lab 06/04/20 1626  LATICACIDVEN  1.5    Recent Results (from the past 240 hour(s))  SARS CORONAVIRUS 2 (TAT 6-24 HRS) Nasopharyngeal Nasopharyngeal Swab     Status: None   Collection Time: 06/04/20  4:26 PM   Specimen: Nasopharyngeal Swab  Result Value Ref Range Status   SARS Coronavirus 2  NEGATIVE NEGATIVE Final    Comment: (NOTE) SARS-CoV-2 target nucleic acids are NOT DETECTED.  The SARS-CoV-2 RNA is generally detectable in upper and lower respiratory specimens during the acute phase of infection. Negative results do not preclude SARS-CoV-2 infection, do not rule out co-infections with other pathogens, and should not be used as the sole basis for treatment or other patient management decisions. Negative results must be combined with clinical observations, patient history, and epidemiological information. The expected result is Negative.  Fact Sheet for Patients: SugarRoll.be  Fact Sheet for Healthcare Providers: https://www.woods-mathews.com/  This test is not yet approved or cleared by the [place] FDA and  has been authorized for detection and/or diagnosis of SARS-CoV-2 by FDA under an Emergency Use Authorization (EUA). This EUA will remain  in effect (meaning this test can be used) for the duration of the COVID-19 declaration under Se ction 564(b)(1) of the Act, 21 U.S.C. section 360bbb-3(b)(1), unless the authorization is terminated or revoked sooner.  Performed at [facility], [place] [address] [place], [place] [address]   Blood Culture (routine x 2)     Status: None (Preliminary result)   Collection Time: 06/04/20  5:55 PM   Specimen: BLOOD  Result Value Ref Range Status   Specimen Description BLOOD SITE NOT SPECIFIED  Final   Special Requests   Final    BOTTLES DRAWN AEROBIC AND ANAEROBIC Blood Culture results may not be optimal due to an inadequate volume of blood received in culture bottles   Culture   Final    NO GROWTH 4 DAYS Performed at [facility], [place] [address], [place], [place] [address]    Report Status PENDING  Incomplete  Blood Culture (routine x 2)     Status: None (Preliminary result)   Collection Time: 06/04/20  5:56 PM   Specimen: BLOOD  Result Value Ref Range Status    Specimen Description BLOOD SITE NOT SPECIFIED  Final   Special Requests   Final    BOTTLES DRAWN AEROBIC AND ANAEROBIC Blood Culture results may not be optimal due to an inadequate volume of blood received in culture bottles   Culture   Final    NO GROWTH 4 DAYS Performed at [facility], [place] [address], [place], [place] [address]    Report Status PENDING  Incomplete  Urine culture     Status: Abnormal   Collection Time: 06/05/20  3:35 AM   Specimen: In/Out Cath Urine  Result Value Ref Range Status   Specimen Description IN/OUT CATH URINE  Final   Special Requests   Final    NONE Performed at [facility], [place] [address], [place], [place] [address]    Culture MULTIPLE SPECIES PRESENT, SUGGEST RECOLLECTION (A)  Final   Report Status 06/06/2020 FINAL  Final  MRSA PCR Screening     Status: None   Collection Time: 06/05/20  7:51 AM   Specimen: Nasopharyngeal  Result Value Ref Range Status   MRSA by PCR NEGATIVE NEGATIVE Final    Comment:        The GeneXpert MRSA Assay (FDA approved for NASAL specimens only), is one component of a comprehensive MRSA  colonization surveillance program. It is not intended to diagnose MRSA infection nor to guide or monitor treatment for MRSA infections. Performed at [facility], [place] [address], [place], [place] [address]   Aerobic/Anaerobic Culture w Gram Stain (surgical/deep wound)     Status: None (Preliminary result)   Collection Time: 06/06/20  3:49 PM   Specimen: Abscess  Result Value Ref Range Status   Specimen Description ABSCESS  Final   Special Requests RIGHT FIFTH TOE (RAY)  Final   Gram Stain   Final    RARE WBC PRESENT, PREDOMINANTLY PMN ABUNDANT GRAM POSITIVE COCCI RARE GRAM POSITIVE RODS Performed at [facility], [place] [address], [place], [place] [address]    Culture PENDING  Incomplete   Report Status PENDING  Incomplete      Radiology Studies: No results found.  Scheduled Meds: . (feeding  supplement) PROSource Plus  30 mL Oral BID BM  . amLODipine  10 mg Oral Daily  . vitamin C  1,000 mg Oral Daily  . colchicine  0.6 mg Oral BID  . docusate sodium  100 mg Oral Daily  . feeding supplement (GLUCERNA SHAKE)  237 mL Oral TID BM  . insulin aspart  0-15 Units Subcutaneous TID WC  . insulin aspart  0-5 Units Subcutaneous QHS  . insulin aspart  8 Units Subcutaneous TID WC  . insulin detemir  45 Units Subcutaneous BID  . insulin starter kit- pen needles  1 kit Other Once  . multivitamin with minerals  1 tablet Oral Daily  . pantoprazole  40 mg Oral Daily  . zinc sulfate  220 mg Oral Daily   Continuous Infusions: . sodium chloride 75 mL/hr at 06/07/20 1719  . ceFEPime (MAXIPIME) IV 2 g (06/08/20 0456)  . magnesium sulfate bolus IVPB    . metronidazole 500 mg (06/08/20 0953)  . vancomycin 1,250 mg (06/07/20 2144)     LOS: 4 days   Time spent: 28 minutes   Darliss Cheney, MD [facility]  06/08/2020, 11:20 AM   To contact the attending provider between 7A-7P or the covering provider during after hours 7P-7A, please log into the web site [email or web address].

## 2020-06-08 NOTE — Op Note (Signed)
06/08/2020  2:48 PM  PATIENT:  Patrick [name]    PRE-OPERATIVE DIAGNOSIS:  Right Foot Abscess  POST-OPERATIVE DIAGNOSIS:  Same  PROCEDURE:  RIGHT 4TH RAY AMPUTATION Local tissue rearrangement for wound closure 11 x 5 cm. Application of Kerecis skin graft. Application of cleanse choice wound VAC.  SURGEON:  [name], MD  PHYSICIAN ASSISTANT:None ANESTHESIA:   General  PREOPERATIVE INDICATIONS:  Patrick [name] is a  55 y.o. male with a diagnosis of Right Foot Abscess who failed conservative measures and elected for surgical management.    The risks benefits and alternatives were discussed with the patient preoperatively including but not limited to the risks of infection, bleeding, nerve injury, cardiopulmonary complications, the need for revision surgery, among others, and the patient was willing to proceed.  OPERATIVE IMPLANTS: Kerecis skin graft  '@ENCIMAGES'$ @  OPERATIVE FINDINGS: Tissue margins clear no evidence of any deep abscess or infection.  OPERATIVE PROCEDURE: Patient brought the operating room after undergoing a regional anesthetic.  After adequate levels anesthesia were obtained patient's right lower extremity was prepped using DuraPrep draped into a sterile field a timeout was called.  The previous cleanse choice sponge was removed the wound was irrigated with normal saline.  The wound edges had mild ischemic changes.  The wound edges were ellipsed out the left the wound is 11 x 5 cm.  Patient underwent a fourth ray amputation through the base and the fifth ray amputation was also revised through the base of the fifth metatarsal.  Electrocautery was used hemostasis.  The wound was further irrigated with normal saline.  The Kerecis skin graft was placed deep in the wound to cover the bone.  Local tissue rearrangement was used to close the incision over the skin graft 2-0 nylon was used the wound was 11 x 5 cm.  The cleanse choice wound VAC sponges were applied  over the wound this was covered with derma tack this had a good suction fit this was overwrapped with Coban up to the knee.  Patient was taken the PACU in stable condition.   DISCHARGE PLANNING:  Antibiotic duration: Patient will need 3 to 4 weeks of oral antibiotics after cultures are finalized.  Weightbearing: Strict nonweightbearing on the right for 4 weeks  Pain medication: Opioid pathway  Dressing care/ Wound VAC: Continue wound VAC for 1 week  Ambulatory devices: Walker or crutches or wheelchair.  Discharge to: Patient states that his son is graduating from college on Saturday and would like to discharge on Friday.  Follow-up: In the office 1 week post operative.

## 2020-06-08 NOTE — Interval H&P Note (Signed)
History and Physical Interval Note:  06/08/2020 7:07 AM  Patrick [name]  has presented today for surgery, with the diagnosis of Right Foot Abscess.  The various methods of treatment have been discussed with the patient and family. After consideration of risks, benefits and other options for treatment, the patient has consented to  Procedure(s): Keyes (Right) as a surgical intervention.  The patient's history has been reviewed, patient examined, no change in status, stable for surgery.  I have reviewed the patient's chart and labs.  Questions were answered to the patient's satisfaction.     [name]

## 2020-06-08 NOTE — Anesthesia Procedure Notes (Signed)
Anesthesia Regional Block: Popliteal block   Pre-Anesthetic Checklist: ,, timeout performed, Correct Patient, Correct Site, Correct Laterality, Correct Procedure, Correct Position, site marked, Risks and benefits discussed,  Surgical consent,  Pre-op evaluation,  At surgeon's request and post-op pain management  Laterality: Right  Prep: Dura Prep       Needles:  Injection technique: Single-shot  Needle Type: Echogenic Stimulator Needle     Needle Length: 10cm  Needle Gauge: 20     Additional Needles:   Procedures:,,,, ultrasound used (permanent image in chart),,,,  Narrative:  Start time: 06/08/2020 1:20 PM End time: 06/08/2020 1:24 PM Injection made incrementally with aspirations every 5 mL.  Performed by: Personally  Anesthesiologist: Darral Dash, DO  Additional Notes: Patient identified. Risks/Benefits/Options discussed with patient including but not limited to bleeding, infection, nerve damage, failed block, incomplete pain control. Patient expressed understanding and wished to proceed. All questions were answered. Sterile technique was used throughout the entire procedure. Please see nursing notes for vital signs. Aspirated in 5cc intervals with injection for negative confirmation. Patient was given instructions on fall risk and not to get out of bed. All questions and concerns addressed with instructions to call with any issues or inadequate analgesia.

## 2020-06-08 NOTE — Progress Notes (Signed)
OT Cancellation Note  Patient Details Name: Patrick [name] MRN: [id number] DOB: 1965/07/02   Cancelled Treatment:    Reason Eval/Treat Not Completed: Medical issues which prohibited therapy (Pt to have procedure/go into surgery today for RLE. Pt  has been NPO since midnight. OT to continue to follow for OT eval.)  Jefferey Pica, OTR/L Acute Rehabilitation Services Pager: [phone] Office: [phone]  Jeremie Abdelaziz C 06/08/2020, 9:08 AM

## 2020-06-09 ENCOUNTER — Encounter (HOSPITAL_COMMUNITY): Payer: Self-pay | Admitting: Orthopedic Surgery

## 2020-06-09 LAB — CBC WITH DIFFERENTIAL/PLATELET
Abs Immature Granulocytes: 0.42 10*3/uL — ABNORMAL HIGH (ref 0.00–0.07)
Basophils Absolute: 0 10*3/uL (ref 0.0–0.1)
Basophils Relative: 0 %
Eosinophils Absolute: 0 10*3/uL (ref 0.0–0.5)
Eosinophils Relative: 0 %
HCT: 26.5 % — ABNORMAL LOW (ref 39.0–52.0)
Hemoglobin: 8.2 g/dL — ABNORMAL LOW (ref 13.0–17.0)
Immature Granulocytes: 2 %
Lymphocytes Relative: 5 %
Lymphs Abs: 1.1 10*3/uL (ref 0.7–4.0)
MCH: 26.8 pg (ref 26.0–34.0)
MCHC: 30.9 g/dL (ref 30.0–36.0)
MCV: 86.6 fL (ref 80.0–100.0)
Monocytes Absolute: 1.1 10*3/uL — ABNORMAL HIGH (ref 0.1–1.0)
Monocytes Relative: 6 %
Neutro Abs: 17.1 10*3/uL — ABNORMAL HIGH (ref 1.7–7.7)
Neutrophils Relative %: 87 %
Platelets: 478 10*3/uL — ABNORMAL HIGH (ref 150–400)
RBC: 3.06 MIL/uL — ABNORMAL LOW (ref 4.22–5.81)
RDW: 14 % (ref 11.5–15.5)
WBC: 19.8 10*3/uL — ABNORMAL HIGH (ref 4.0–10.5)
nRBC: 0 % (ref 0.0–0.2)

## 2020-06-09 LAB — VANCOMYCIN, RANDOM
Vancomycin Rm: 28
Vancomycin Rm: 20

## 2020-06-09 LAB — BASIC METABOLIC PANEL
Anion gap: 10 (ref 5–15)
BUN: 43 mg/dL — ABNORMAL HIGH (ref 6–20)
CO2: 18 mmol/L — ABNORMAL LOW (ref 22–32)
Calcium: 8.5 mg/dL — ABNORMAL LOW (ref 8.9–10.3)
Chloride: 105 mmol/L (ref 98–111)
Creatinine, Ser: 2.1 mg/dL — ABNORMAL HIGH (ref 0.61–1.24)
GFR, Estimated: 37 mL/min — ABNORMAL LOW (ref >60)
Glucose, Bld: 348 mg/dL — ABNORMAL HIGH (ref 70–99)
Potassium: 4.7 mmol/L (ref 3.5–5.1)
Sodium: 133 mmol/L — ABNORMAL LOW (ref 135–145)

## 2020-06-09 LAB — URIC ACID: Uric Acid, Serum: 9.1 mg/dL — ABNORMAL HIGH (ref 3.7–8.6)

## 2020-06-09 LAB — CULTURE, BLOOD (ROUTINE X 2)
Culture: NO GROWTH
Culture: NO GROWTH

## 2020-06-09 LAB — GLUCOSE, CAPILLARY
Glucose-Capillary: 261 mg/dL — ABNORMAL HIGH (ref 70–99)
Glucose-Capillary: 188 mg/dL — ABNORMAL HIGH (ref 70–99)
Glucose-Capillary: 168 mg/dL — ABNORMAL HIGH (ref 70–99)
Glucose-Capillary: 150 mg/dL — ABNORMAL HIGH (ref 70–99)

## 2020-06-09 MED ORDER — ALLOPURINOL 100 MG PO TABS
100.0000 mg | ORAL_TABLET | Freq: Two times a day (BID) | ORAL | Status: DC
  Administered 2020-06-09 – 2020-06-10 (×3): 100 mg via ORAL
  Filled 2020-06-09 (×3): qty 1

## 2020-06-09 MED ORDER — INSULIN DETEMIR 100 UNIT/ML ~~LOC~~ SOLN
55.0000 [IU] | Freq: Two times a day (BID) | SUBCUTANEOUS | Status: DC
  Administered 2020-06-09 (×2): 55 [IU] via SUBCUTANEOUS
  Filled 2020-06-09 (×4): qty 0.55

## 2020-06-09 MED ORDER — INDOMETHACIN 25 MG PO CAPS
50.0000 mg | ORAL_CAPSULE | Freq: Three times a day (TID) | ORAL | Status: DC
  Administered 2020-06-09 – 2020-06-10 (×5): 50 mg via ORAL
  Filled 2020-06-09 (×7): qty 2

## 2020-06-09 NOTE — Progress Notes (Signed)
Patient tolerating PO intake very well.  Family brought in food from outside before bed.  No nausea or vomiting.

## 2020-06-09 NOTE — Progress Notes (Signed)
Inpatient Rehab Admissions Coordinator:   Note PT/OT recommending home health.  Will sign off for CIR at this time.   Shann Medal, PT, DPT Admissions Coordinator [phone] 06/09/20  4:22 PM

## 2020-06-09 NOTE — Progress Notes (Signed)
Pharmacy Antibiotic Note  Patrick [name] is a 55 y.o. male admitted on 06/04/2020 with R- foot wound with gas, possible necrotizing fascitis now s/p 4th and 5th toe amputations and I&D.  Pharmacy has been consulted for vancomycin dosing. Piperacillin/tazobactam was switched to cefepime + metronidazole for lower risk of AKI.   WBC 19.8, afebrile. Renal function slightly better, clcr ~94m/min. Will need 4 weeks of antibiotics per ortho.   4/21 vanc levels with AUC of 508, at goal    Plan: Continue vancomycin to 1250 mg IV Q 24 hrs  Cefepime and metronidazole per MD  Monitor cultures, clinical status, renal fx, vanc level if continuing Narrow abx as able and f/u duration    Height: 6' (182.9 cm) Weight: (!) 145.8 kg (321 lb 6.9 oz) IBW/kg (Calculated) : 77.6  Temp (24hrs), Avg:98.2 F (36.8 C), Min:97.9 F (36.6 C), Max:98.8 F (37.1 C)  Recent Labs  Lab 06/04/20 1626 06/05/20 0557 06/06/20 0353 06/07/20 0249 06/08/20 0427 06/09/20 0126  WBC 19.4* 18.6* 16.7* 20.9* 19.8* 19.8*  CREATININE 2.14* 1.98* 2.33* 2.35* 2.00* 2.10*  LATICACIDVEN 1.5  --   --   --   --   --   VANCORANDOM  --   --   --   --   --  28    Estimated Creatinine Clearance: 60.4 mL/min (A) (by C-G formula based on SCr of 2.1 mg/dL (H)).    No Known Allergies  Antimicrobials this admission: Piptazo 4/16 >>4/19 Clinda 4/16 >>4/19 MTZ 4/16; 4/19 >>   Cefepime 4/16; 4/19 >>  Vanc 4/16 >>    Microbiology results: 4/16 BCx: ngtd 4/16 UCx: multi species 4/17 MRSA neg  4/18 OR cx: abundant E Faecalis, Rare GPR  Thank you for allowing pharmacy to be a part of this patient's care.  LBenetta Spar PharmD, BCPS, BCCP Clinical Pharmacist  Please check AMION for all MKennardphone numbers After 10:00 PM, call [phone]

## 2020-06-09 NOTE — Evaluation (Signed)
Physical Therapy Evaluation Patient Details Name: Patrick [name] MRN: [id number] DOB: [birth year] Today's Date: 06/09/2020   History of Present Illness  55 yo male presents to [facility] on 4/16 with R lateral foot wound x2 weeks, was septic upon arrival to ED. 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 PMH includes DM, HTN, gout.  Clinical Impression   Pt presents with generalized weakness, LLE gouty pain, difficulty performing mobility tasks with new NWB RLE status, and decreased activity tolerance. Pt to benefit from acute PT to address deficits. Pt requiring close guard for safety during bed mobility and stand pivot transfer to recliner, pt limited by difficulty maintaining NWB status and RLE gout pain. Pt adamant that he will d/c home and go to his son's college graduation in [place] on Saturday, which will require him to fly. PT recommending [facility] services, along with listed DME in order to ensure pt safety in maintaining WB precautions post-operatively. Of note, pt's PA clarified that pt is to be strictly NWB, no WB through heel. PT to progress mobility as tolerated, and will continue to follow acutely.      Follow Up Recommendations Home health PT;Supervision for mobility/OOB    Equipment Recommendations  Rolling walker with 5" wheels;3in1 (PT);Wheelchair (measurements PT);Wheelchair cushion (measurements PT) (bariatric RW and w/c)    Recommendations for Other Services       Precautions / Restrictions Precautions Precautions: Fall Required Braces or Orthoses: Other Brace Other Brace: darco shoe donned Restrictions Weight Bearing Restrictions: Yes RLE Weight Bearing: Non weight bearing Other Position/Activity Restrictions: strict NWB RLE x4 weeks per Dr. Jess Barters note, Per pt Dr. Sharol Given states he can put weight through his heel. Ortho PA secure chatted to clarify, who states "go with (NWB) orders"      Mobility  Bed Mobility Overal bed mobility: Needs  Assistance             General bed mobility comments: sitting EOB upon PT arrival to room    Transfers Overall transfer level: Needs assistance Equipment used: Rolling walker (2 wheeled) Transfers: Sit to/from [facility] Sit to Stand: Min guard Stand pivot transfers: Min guard       General transfer comment: min guard for safety, lines/leads. PT enforcing NWB RLE, pt states "I can put weight through my heel". Very unsafe, uncontrolled descent into recliner, reports gout pain became too much.  Ambulation/Gait             General Gait Details: NT - difficulty with WB status  Stairs            Wheelchair Mobility    Modified Rankin (Stroke Patients Only)       Balance Overall balance assessment: Needs assistance Sitting-balance support: No upper extremity supported;Feet supported Sitting balance-Leahy Scale: Fair     Standing balance support: Bilateral upper extremity supported;During functional activity Standing balance-Leahy Scale: Poor Standing balance comment: reliant on external support                             Pertinent Vitals/Pain Pain Assessment: 0-10 Pain Score: 8  Pain Location: LLE from gout flare Pain Descriptors / Indicators: Sore;Discomfort;Grimacing Pain Intervention(s): Limited activity within patient's tolerance;Monitored during session;Repositioned    Home Living Family/patient expects to be discharged to:: Private residence Living Arrangements: Other (Comment) (at friend's house)   Type of Home: House Home Access: Level entry     Home Layout: Two  level;1/2 bath on main level Home Equipment: None      Prior Function Level of Independence: Independent         Comments: pt states he plans to go to his son's graduation in [place] this weekend, has to fly. Pt reports complete independence with mobility PTA     Hand Dominance   Dominant Hand: Right    Extremity/Trunk Assessment   Upper  Extremity Assessment Upper Extremity Assessment: Defer to OT evaluation    Lower Extremity Assessment Lower Extremity Assessment: Generalized weakness;LLE deficits/detail LLE Deficits / Details: gouty pain in L knee LLE: Unable to fully assess due to pain    Cervical / Trunk Assessment Cervical / Trunk Assessment: Normal  Communication   Communication: No difficulties  Cognition Arousal/Alertness: Awake/alert Behavior During Therapy: WFL for tasks assessed/performed Overall Cognitive Status: Within Functional Limits for tasks assessed                                 General Comments: lacks insight into deficits      General Comments General comments (skin integrity, edema, etc.): wound vac RLE    Exercises     Assessment/Plan    PT Assessment Patient needs continued PT services  PT Problem List Decreased strength;Decreased mobility;Decreased activity tolerance;Decreased balance;Decreased knowledge of use of DME;Pain;Decreased knowledge of precautions;Decreased safety awareness       PT Treatment Interventions DME instruction;Therapeutic activities;Gait training;Therapeutic exercise;Balance training;Patient/family education;Functional mobility training;Neuromuscular re-education    PT Goals (Current goals can be found in the Care Plan section)  Acute Rehab PT Goals Patient Stated Goal: go to my son's graduation on saturday PT Goal Formulation: With patient Time For Goal Achievement: 06/23/20 Potential to Achieve Goals: Fair    Frequency Min 3X/week   Barriers to discharge        Co-evaluation               AM-PAC PT "6 Clicks" Mobility  Outcome Measure Help needed turning from your back to your side while in a flat bed without using bedrails?: A Little Help needed moving from lying on your back to sitting on the side of a flat bed without using bedrails?: A Little Help needed moving to and from a bed to a chair (including a wheelchair)?: A  Little Help needed standing up from a chair using your arms (e.g., wheelchair or bedside chair)?: A Little Help needed to walk in hospital room?: A Little Help needed climbing 3-5 steps with a railing? : A Lot 6 Click Score: 17    End of Session Equipment Utilized During Treatment: Other (comment) (R darco shoe) Activity Tolerance: Patient limited by fatigue;Patient limited by pain Patient left: in chair;with call bell/phone within reach;Other (comment) (pt verbalizes he will press call button and wait for assist prior to mobilizing back to bed) Nurse Communication: Mobility status;Weight bearing status PT Visit Diagnosis: Other abnormalities of gait and mobility (R26.89);Difficulty in walking, not elsewhere classified (R26.2)    Time: [id number] PT Time Calculation (min) (ACUTE ONLY): 24 min   Charges:   PT Evaluation $PT Eval Low Complexity: 1 Low PT Treatments $Therapeutic Activity: 8-22 mins       Stacie Glaze, PT DPT Acute Rehabilitation Services Pager [phone]  Office [phone]  Roxine Caddy E Ruffin Pyo 06/09/2020, 12:30 PM

## 2020-06-09 NOTE — Progress Notes (Addendum)
Patient is postop day 1 status post right fourth ray amputation.  He is lying comfortably in bed.  His biggest complaint is he has a history of gout and he is having a gouty flare.  He normally takes allopurinol for maintenance but admits he does not take this steadily.  He takes indomethacin for flares.  I guess he brought some with him but the nurses did not allow him to take it.  Vital signs stable alert pleasant to exam wound VAC has 100 cc in the canister is functioning with 2 green checks.  He does have bloody drainage across the plantar surface of the dressing.  He says this is because he walked on it when he got up to use the restroom.  Cultures show Enterococcus faecalis.  Still awaiting sensitivities.  Patient would like to discharge soon as possible as his son is graduating from college on Saturday and he needs to be present.  I would anticipate we could discharge in today or tomorrow hopefully will have sensitivities available.  Caution the patient about weightbearing.  Have ordered a uric acid and nursing is looking into if he can take his indomethacin that he brought with him   Patient will need 4 weeks what we hope will be an oral antibiotic

## 2020-06-09 NOTE — Evaluation (Signed)
Occupational Therapy Evaluation Patient Details Name: Patrick [name] MRN: [id number] DOB: 1965/07/31 Today's Date: 06/09/2020    History of Present Illness 55 yo male presents to [facility] on 4/16 with R lateral foot wound x2 weeks, was septic upon arrival to ED. 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 PMH includes DM, HTN, gout.   Clinical Impression   Pt presents with decline in function and safety with ADLs and ADL mobility with impaired balance and endurance; pt limited by gout flare up pain in L LE and with Poor awareness of deficits with difficulty performing ADL mobility tasks with new NWB RLE status. Pt adamant that he will d/c home and go to his son's college graduation in [place] on Saturday, which will require him to fly. PTA, pt was Ind with ADLs and mobility with no AD. Pt currently requires mod A with LB ADLs and toileting tasks and min guard A for mobility using RW. Pt would benefit from acute OT services to address impairments to maximize level of function and safety.  Of note, pt's PA clarified that pt is to be strictly NWB, no WB through heel.    Follow Up Recommendations  Home health OT;Supervision - Intermittent    Equipment Recommendations  3 in 1 bedside commode;Other (comment);Wheelchair (measurements OT);Wheelchair cushion (measurements OT) (bariatric RW and w/c, ADL A/E kit)    Recommendations for Other Services       Precautions / Restrictions Precautions Precautions: Fall Required Braces or Orthoses: Other Brace Other Brace: darco shoe donned Restrictions Weight Bearing Restrictions: Yes RLE Weight Bearing: Non weight bearing Other Position/Activity Restrictions: strict NWB RLE x4 weeks per Dr. Jess Barters note, Per pt Dr. Sharol Given states he can put weight through his heel. Ortho PA secure chatted to clarify, who states "go with (NWB) orders"      Mobility Bed Mobility Overal bed mobility: Needs Assistance Bed Mobility: Supine to Sit      Supine to sit: Supervision;HOB elevated     General bed mobility comments: increased time and effort, used rail    Transfers Overall transfer level: Needs assistance Equipment used: Rolling walker (2 wheeled) Transfers: Sit to/from Stand Sit to Stand: Min guard Stand pivot transfers: Min guard       General transfer comment: pt required increased time to initiate sit -stand from EOB to RW, pt stood x 20 seconds    Balance Overall balance assessment: Needs assistance Sitting-balance support: No upper extremity supported;Feet supported Sitting balance-Leahy Scale: Fair     Standing balance support: Bilateral upper extremity supported;During functional activity Standing balance-Leahy Scale: Poor Standing balance comment: reliant on external support                           ADL either performed or assessed with clinical judgement   ADL Overall ADL's : Needs assistance/impaired Eating/Feeding: Independent;Sitting   Grooming: Wash/dry hands;Wash/dry face;Set up;Sitting   Upper Body Bathing: Set up;Sitting   Lower Body Bathing: Moderate assistance;Sitting/lateral leans   Upper Body Dressing : Set up;Sitting   Lower Body Dressing: Moderate assistance;Sitting/lateral leans Lower Body Dressing Details (indicate cue type and reason): Pt unable to reach passed knees Toilet Transfer: Min guard;RW   Toileting- Clothing Manipulation and Hygiene: Moderate assistance;Sit to/from stand       Functional mobility during ADLs: Min guard;Rolling walker General ADL Comments: pt educated on LB selfcare compensatory techniques and ADL A/E for LB selfcare with handout provided  Vision Patient Visual Report: No change from baseline       Perception     Praxis      Pertinent Vitals/Pain Pain Assessment: 0-10 Pain Score: 8  Pain Location: LLE from gout flare Pain Descriptors / Indicators: Sore;Discomfort;Grimacing Pain Intervention(s): Limited activity within  patient's tolerance;Monitored during session;Repositioned     Hand Dominance Right   Extremity/Trunk Assessment Upper Extremity Assessment Upper Extremity Assessment: Overall WFL for tasks assessed   Lower Extremity Assessment Lower Extremity Assessment: Defer to PT evaluation LLE Deficits / Details: gouty pain in L knee LLE: Unable to fully assess due to pain   Cervical / Trunk Assessment Cervical / Trunk Assessment: Normal   Communication Communication Communication: No difficulties   Cognition Arousal/Alertness: Awake/alert Behavior During Therapy: WFL for tasks assessed/performed Overall Cognitive Status: Within Functional Limits for tasks assessed                                 General Comments: lacks insight into deficits   General Comments  wound vac RLE    Exercises     Shoulder Instructions      Home Living Family/patient expects to be discharged to:: Private residence Living Arrangements: Other (Comment) (lives with a friend) Available Help at Discharge: Friend(s);Available PRN/intermittently Type of Home: House Home Access: Level entry     Home Layout: Two level;1/2 bath on main level Alternate Level Stairs-Number of Steps: flight Alternate Level Stairs-Rails: Left;Right Bathroom Shower/Tub: Teacher, early years/pre: Standard     Home Equipment: None          Prior Functioning/Environment Level of Independence: Independent        Comments: pt states he plans to go to his son's graduation in [place] this weekend, has to fly. Pt reports complete independence with ADLs/selfcare and mobility PTA        OT Problem List: Impaired balance (sitting and/or standing);Pain;Decreased safety awareness;Decreased activity tolerance;Decreased coordination;Decreased knowledge of use of DME or AE;Increased edema      OT Treatment/Interventions: Self-care/ADL training;Therapeutic exercise;Patient/family education;Balance  training;Therapeutic activities;DME and/or AE instruction    OT Goals(Current goals can be found in the care plan section) Acute Rehab OT Goals Patient Stated Goal: go to my son's graduation on saturday OT Goal Formulation: With patient Time For Goal Achievement: 06/23/20 Potential to Achieve Goals: Good ADL Goals Pt Will Perform Lower Body Bathing: with min assist;with min guard assist;with adaptive equipment Pt Will Perform Lower Body Dressing: with min assist;with min guard assist;with adaptive equipment Pt Will Transfer to Toilet: with supervision;stand pivot transfer Pt Will Perform Toileting - Clothing Manipulation and hygiene: with min assist;with min guard assist;sit to/from stand  OT Frequency: Min 2X/week   Barriers to D/C:            Co-evaluation              AM-PAC OT "6 Clicks" Daily Activity     Outcome Measure Help from another person eating meals?: None Help from another person taking care of personal grooming?: A Little Help from another person toileting, which includes using toliet, bedpan, or urinal?: A Lot Help from another person bathing (including washing, rinsing, drying)?: A Lot Help from another person to put on and taking off regular upper body clothing?: A Little Help from another person to put on and taking off regular lower body clothing?: A Lot 6 Click Score: 16   End of Session  Equipment Utilized During Treatment: Rolling walker;Other (comment) (R Darco Shoe)  Activity Tolerance: Patient limited by pain Patient left: in bed;with call bell/phone within reach;with nursing/sitter in room (sitting EOB)  OT Visit Diagnosis: Unsteadiness on feet (R26.81);Other abnormalities of gait and mobility (R26.89);Pain Pain - Right/Left: Left Pain - part of body: Leg;Ankle and joints of foot                Time: 4008-6761 OT Time Calculation (min): 32 min Charges:  OT General Charges $OT Visit: 1 Visit OT Evaluation $OT Eval Moderate Complexity: 1  Mod OT Treatments $Self Care/Home Management : 8-22 mins   Britt Bottom 06/09/2020, 1:29 PM

## 2020-06-09 NOTE — Progress Notes (Signed)
PROGRESS NOTE    Patrick [name]  KCL:[id number] DOB: Mar 05, 1965 DOA: 06/04/2020 [facility]CP: [facility]   Brief Narrative:  Patrick [name] is a 55 y.o. male with medical history significant of DM2, HTN.  Who presented to ED with wound to the right lateral foot for 2 weeks.  No fever.  Minimal pain. Has not been on insulins at all recently due to cost and no insurance until recently.  Upon arrival to ED, patient was septic with Patrick [name] 103.1, HR 122, RR 30, WBC 19.4k.  Lactic acid were normal.  Chest x-ray normal.  Covid negative.  He was admitted to hospital service and was started on cefepime Flagyl as well as vancomycin and orthopedics were consulted.  He underwent amputation of the fifth ray right foot on 06/06/2020 and then he had amputation of the right fourth ray on 06/08/2020.  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  Status post ABI followed by right fifth ray amputation on 06/06/2020 by Dr. Sharol Given.  Scheduled for another surgery today.  Had high-grade fever up to 102.5 on the evening of 06/06/2020 after the surgery.  Blood culture remain negative.  Culture from the foot is growing Enterococcus faecalis.  Continue current antibiotics which is cefepime and vancomycin.  Defer further management and oral antibiotic choice to orthopedics.  Patient has wound VAC.  He wants to go home by tomorrow the latest so he can attend his son's graduation.  AKI on CKD stage IIIa: Baseline creatinine around 1.5-1.7.  Presented with  2.14.  Got worse with a peak of 2.35 but improved today to 2.0.  Avoid nephrotoxic agents.  Continue to hold metformin, hydrochlorothiazide and losartan.  Essential hypertension: Blood pressure slightly elevated.  Continue amlodipine but hold other antihypertensives.  As needed hydralazine.  Gout with multiple joints affected: Complained of left elbow, right ring finger and left knee pain.  All of these joints are edematous and tender to palpation and warm as well.  Consistent with clinical findings of gout.  Patient takes allopurinol and indomethacin at home.  He has been on colchicine here.  He is requesting to resume his indomethacin.  I did discuss him in length that indomethacin may increase the risk of bleeding.  He is okay with the risk and is adamant on resuming indomethacin.  I have resumed it.  Allopurinol resumed as well.  Orthopedics on board and aware of this as well.  Deferred to orthopedics to make a decision whether any of his joints needs arthrocentesis.  I sent a message to Barnie Mort, PA to Dr. Sharol Given.  Uncontrolled type 2 diabetes mellitus with hyperglycemia: Blood sugar still elevated.  Increase Lantus to 55 units twice daily and continue SSI.  DVT prophylaxis: SCD's Start: 06/08/20 1605 SCD's Start: 06/07/20 1054 SCDs Start: 06/04/20 1933   Code Status: Full Code  Family Communication: None present at bedside.  Plan of care discussed with patient in length and he verbalized understanding and agreed with it.  Status is: Inpatient  Remains inpatient appropriate because:Inpatient level of care appropriate due to severity of illness   Dispo: The patient is from: Home              Anticipated d/c is to: Home              Patient currently is not medically stable to d/c.   Difficult to place patient No    Estimated body mass index is 43.59 kg/m as calculated from the following:   Height as of this encounter: 6' (1.829 m).   Weight as of this encounter: 145.8 kg.       Nutritional status:  Nutrition Problem: Increased nutrient needs Etiology: wound healing   Signs/Symptoms: estimated needs   Interventions: MVI,Prostat,Premier Protein,Education    Consultants:   Orthopedics  Procedures:   As above  Antimicrobials:  Anti-infectives (From admission, onward)   Start     Dose/Rate Route Frequency Ordered Stop   06/08/20 1400  ceFEPIme (MAXIPIME) 2 g in sodium chloride 0.9 % 100 mL IVPB        2 g 200 mL/hr over 30 Minutes Intravenous Every 8 hours 06/08/20 1202     06/08/20 0815  ceFAZolin (ANCEF) IVPB 3g/100 mL premix  Status:  Discontinued        3 g 200 mL/hr over 30 Minutes Intravenous On call to O.R. 06/08/20 0728 06/08/20 0730   06/08/20 0600  ceFAZolin (ANCEF) IVPB 3g/100 mL premix  Status:  Discontinued        3 g 200 mL/hr over 30 Minutes Intravenous On call to O.R. 06/07/20 1233 06/08/20 1051   06/07/20 2200  vancomycin (VANCOREADY) IVPB 1250 mg/250 mL        1,250 mg 166.7 mL/hr over 90 Minutes Intravenous Every 24 hours 06/07/20 1050     06/07/20 1800  metroNIDAZOLE (FLAGYL) IVPB 500 mg        500 mg 100 mL/hr over 60 Minutes Intravenous Every 8 hours 06/07/20 1542     06/07/20 1800  ceFEPIme (MAXIPIME) 2 g in sodium chloride 0.9 % 100 mL IVPB  Status:  Discontinued        2 g 200 mL/hr over 30 Minutes Intravenous Every 12 hours 06/07/20 1542 06/08/20 1202   06/06/20 0600  ceFAZolin (ANCEF) IVPB 3g/100 mL premix        3 g 200 mL/hr over 30 Minutes Intravenous On call to O.R. 06/05/20 2230 06/06/20 1534   06/05/20 2000  vancomycin (VANCOREADY) IVPB 1500 mg/300 mL  Status:  Discontinued        1,500 mg 150 mL/hr over 120 Minutes Intravenous Every 24 hours 06/04/20 1736 06/07/20 1050   06/05/20 0200  piperacillin-tazobactam (ZOSYN) IVPB 3.375 g  Status:  Discontinued        3.375 g 12.5 mL/hr over 240 Minutes Intravenous Every 8 hours 06/04/20 2025 06/07/20 1542   06/04/20 2200  ceFEPIme (MAXIPIME) 2 g in sodium  chloride 0.9 % 100 mL IVPB  Status:  Discontinued        2 g 200 mL/hr over 30 Minutes Intravenous Every 12 hours 06/04/20 1736 06/04/20 1935   06/04/20 2015  clindamycin (CLEOCIN) IVPB 600 mg  Status:  Discontinued        600 mg 100 mL/hr over 30 Minutes Intravenous Every 8 hours 06/04/20 1935 06/07/20 1046   06/04/20 1730  vancomycin (VANCOREADY) IVPB 2000 mg/400 mL        2,000 mg 200 mL/hr over 120 Minutes Intravenous  Once 06/04/20 1717  06/04/20 2237   06/04/20 1715  ceFEPIme (MAXIPIME) 2 g in sodium chloride 0.9 % 100 mL IVPB        2 g 200 mL/hr over 30 Minutes Intravenous  Once 06/04/20 1712 06/04/20 1836   06/04/20 1715  metroNIDAZOLE (FLAGYL) IVPB 500 mg  Status:  Discontinued        500 mg 100 mL/hr over 60 Minutes Intravenous  Once 06/04/20 1712 06/04/20 1946   06/04/20 1715  vancomycin (VANCOREADY) IVPB 1000 mg/200 mL  Status:  Discontinued        1,000 mg 200 mL/hr over 60 Minutes Intravenous  Once 06/04/20 1712 06/04/20 1717         Subjective: Seen and examined.  Denies any complaint.  States that his pain in the joints is improving.  Requesting to resume his indomethacin.  Objective: Vitals:   06/09/20 0036 06/09/20 0240 06/09/20 0435 06/09/20 0734  BP: (!) 147/91 (!) 139/92 (!) 151/93 (!) 148/97  Pulse: (!) 114 63 (!) 106 (!) 106  Resp: '18 19 20 16  ' Temp: 98.8 F (37.1 C) 98 F (36.7 C)    TempSrc: Oral Oral    SpO2: 100% 94% 99% 98%  Weight:      Height:        Intake/Output Summary (Last 24 hours) at 06/09/2020 1221 Last data filed at 06/09/2020 1204 Gross per 24 hour  Intake 1816.91 ml  Output 1275 ml  Net 541.91 ml   Filed Weights   06/04/20 1714  Weight: (!) 145.8 kg    Examination: General exam: Appears calm and comfortable, morbidly obese Respiratory system: Clear to auscultation. Respiratory effort normal. Cardiovascular system: S1 & S2 heard, RRR. No JVD, murmurs, rubs, gallops or clicks. No pedal edema. Gastrointestinal system:  Abdomen is nondistended, soft and nontender. No organomegaly or masses felt. Normal bowel sounds heard. Central nervous system: Alert and oriented. No focal neurological deficits. Extremities: Wound VAC attached to the right foot, dressing there.  Slightly edematous right elbow, left ring finger.  Not as warm as it was yesterday and not as tender as well. Skin: No rashes, lesions or ulcers.  Psychiatry: Judgement and insight appear normal. Mood & affect appropriate.    Data Reviewed: I have personally reviewed following labs and imaging studies  CBC: Recent Labs  Lab 06/04/20 1626 06/05/20 0557 06/06/20 0353 06/07/20 0249 06/08/20 0427 06/09/20 0126  WBC 19.4* 18.6* 16.7* 20.9* 19.8* 19.8*  NEUTROABS 16.6*  --  13.1* 18.4* 15.8* 17.1*  HGB 9.5* 8.6* 8.3* 9.0* 8.6* 8.2*  HCT 30.5* 27.0* 26.7* 28.8* 27.5* 26.5*  MCV 86.2 84.9 85.3 85.2 84.1 86.6  PLT 439* 417* 398 437* 470* 419*   Basic Metabolic Panel: Recent Labs  Lab 06/05/20 0557 06/06/20 0353 06/07/20 0249 06/08/20 0427 06/09/20 0126  NA 134* 131* 132* 133* 133*  K 3.8 3.9 4.7 4.5 4.7  CL 103 101 102 104 105  CO2 23 20* 19* 20* 18*  GLUCOSE 231* 223* 374* 259* 348*  BUN 26* 25* 29* 39* 43*  CREATININE 1.98* 2.33* 2.35* 2.00* 2.10*  CALCIUM 8.5* 8.2* 8.5* 8.9 8.5*  MG  --  1.3*  --   --   --    GFR: Estimated Creatinine Clearance: 60.4 mL/min (A) (by C-G formula based on SCr of 2.1 mg/dL (H)). Liver Function Tests: Recent Labs  Lab 06/04/20 1626 06/05/20 0557 06/06/20 0353 06/07/20 0249  AST '25 25 17 17  ' ALT '19 20 19 17  ' ALKPHOS 123 113 116  137*  BILITOT 0.9 0.8 0.9 1.0  PROT 7.1 6.1* 6.4* 6.2*  ALBUMIN 2.7* 2.3* 2.3* 2.1*   No results for input(s): LIPASE, AMYLASE in the last 168 hours. No results for input(s): AMMONIA in the last 168 hours. Coagulation Profile: Recent Labs  Lab 06/04/20 1756  INR 1.2   Cardiac Enzymes: No results for input(s): CKTOTAL, CKMB, CKMBINDEX, TROPONINI in the last 168  hours. BNP (last 3 results) No results for input(s): PROBNP in the last 8760 hours. HbA1C: No results for input(s): HGBA1C in the last 72 hours. CBG: Recent Labs  Lab 06/08/20 1450 06/08/20 1602 06/08/20 2030 06/09/20 0732 06/09/20 1126  GLUCAP 135* 116* 251* 261* 188*   Lipid Profile: No results for input(s): CHOL, HDL, LDLCALC, TRIG, CHOLHDL, LDLDIRECT in the last 72 hours. Thyroid Function Tests: No results for input(s): TSH, T4TOTAL, FREET4, T3FREE, THYROIDAB in the last 72 hours. Anemia Panel: No results for input(s): VITAMINB12, FOLATE, FERRITIN, TIBC, IRON, RETICCTPCT in the last 72 hours. Sepsis Labs: Recent Labs  Lab 06/04/20 1626  LATICACIDVEN 1.5    Recent Results (from the past 240 hour(s))  SARS CORONAVIRUS 2 (TAT 6-24 HRS) Nasopharyngeal Nasopharyngeal Swab     Status: None   Collection Time: 06/04/20  4:26 PM   Specimen: Nasopharyngeal Swab  Result Value Ref Range Status   SARS Coronavirus 2 NEGATIVE NEGATIVE Final    Comment: (NOTE) SARS-CoV-2 target nucleic acids are NOT DETECTED.  The SARS-CoV-2 RNA is generally detectable in upper and lower respiratory specimens during the acute phase of infection. Negative results do not preclude SARS-CoV-2 infection, do not rule out co-infections with other pathogens, and should not be used as the sole basis for treatment or other patient management decisions. Negative results must be combined with clinical observations, patient history, and epidemiological information. The expected result is Negative.  Fact Sheet for Patients: SugarRoll.be  Fact Sheet for Healthcare Providers: https://www.woods-mathews.com/  This test is not yet approved or cleared by the [place] FDA and  has been authorized for detection and/or diagnosis of SARS-CoV-2 by FDA under an Emergency Use Authorization (EUA). This EUA will remain  in effect (meaning this test can be used) for the  duration of the COVID-19 declaration under Se ction 564(b)(1) of the Act, 21 U.S.C. section 360bbb-3(b)(1), unless the authorization is terminated or revoked sooner.  Performed at [facility], [place] [address] [place], [place] [address]   Blood Culture (routine x 2)     Status: None   Collection Time: 06/04/20  5:55 PM   Specimen: BLOOD  Result Value Ref Range Status   Specimen Description BLOOD SITE NOT SPECIFIED  Final   Special Requests   Final    BOTTLES DRAWN AEROBIC AND ANAEROBIC Blood Culture results may not be optimal due to an inadequate volume of blood received in culture bottles   Culture   Final    NO GROWTH 5 DAYS Performed at [facility], [place] [address], [place], [place] [address]    Report Status 06/09/2020 FINAL  Final  Blood Culture (routine x 2)     Status: None   Collection Time: 06/04/20  5:56 PM   Specimen: BLOOD  Result Value Ref Range Status   Specimen Description BLOOD SITE NOT SPECIFIED  Final   Special Requests   Final    BOTTLES DRAWN AEROBIC AND ANAEROBIC Blood Culture results may not be optimal due to an inadequate volume of blood received in culture bottles   Culture  Final    NO GROWTH 5 DAYS Performed at [facility], [place] [address], [place], [place] [address]    Report Status 06/09/2020 FINAL  Final  Urine culture     Status: Abnormal   Collection Time: 06/05/20  3:35 AM   Specimen: In/Out Cath Urine  Result Value Ref Range Status   Specimen Description IN/OUT CATH URINE  Final   Special Requests   Final    NONE Performed at [facility], [place] [address] [place], [place] [address]    Culture MULTIPLE SPECIES PRESENT, SUGGEST RECOLLECTION (A)  Final   Report Status 06/06/2020 FINAL  Final  MRSA PCR Screening     Status: None   Collection Time: 06/05/20  7:51 AM   Specimen: Nasopharyngeal  Result Value Ref Range Status   MRSA by PCR NEGATIVE NEGATIVE Final    Comment:        The GeneXpert MRSA Assay  (FDA approved for NASAL specimens only), is one component of a comprehensive MRSA colonization surveillance program. It is not intended to diagnose MRSA infection nor to guide or monitor treatment for MRSA infections. Performed at [facility], [place] [address], [place], [place] [address]   Aerobic/Anaerobic Culture w Gram Stain (surgical/deep wound)     Status: None (Preliminary result)   Collection Time: 06/06/20  3:49 PM   Specimen: Abscess  Result Value Ref Range Status   Specimen Description ABSCESS  Final   Special Requests RIGHT FIFTH TOE (RAY)  Final   Gram Stain   Final    RARE WBC PRESENT, PREDOMINANTLY PMN ABUNDANT GRAM POSITIVE COCCI RARE GRAM POSITIVE RODS    Culture   Final    ABUNDANT ENTEROCOCCUS FAECALIS SUSCEPTIBILITIES TO FOLLOW CULTURE REINCUBATED FOR BETTER GROWTH Performed at [facility], [address], [place], [place] [address]    Report Status PENDING  Incomplete      Radiology Studies: No results found.  Scheduled Meds: . (feeding supplement) PROSource Plus  30 mL Oral BID BM  . allopurinol  100 mg Oral BID  . amLODipine  10 mg Oral Daily  . vitamin C  1,000 mg Oral Daily  . colchicine  0.6 mg Oral BID  . docusate sodium  100 mg Oral Daily  . feeding supplement (GLUCERNA SHAKE)  237 mL Oral TID BM  . indomethacin  50 mg Oral TID WC  . insulin aspart  0-15 Units Subcutaneous TID WC  . insulin aspart  0-5 Units Subcutaneous QHS  . insulin aspart  8 Units Subcutaneous TID WC  . insulin detemir  55 Units Subcutaneous BID  . insulin starter kit- pen needles  1 kit Other Once  . multivitamin with minerals  1 tablet Oral Daily  . pantoprazole  40 mg Oral Daily  . zinc sulfate  220 mg Oral Daily   Continuous Infusions: . ceFEPime (MAXIPIME) IV 2 g (06/09/20 0537)  . lactated ringers    . magnesium sulfate bolus IVPB    . metronidazole 500 mg (06/09/20 0831)  . vancomycin 1,250 mg (06/08/20 2153)     LOS: 5 days   Time spent:  29 minutes   Darliss Cheney, MD [facility]  06/09/2020, 12:21 PM   To contact the attending provider between 7A-7P or the covering provider during after hours 7P-7A, please log into the web site [email or web address].

## 2020-06-10 LAB — CBC WITH DIFFERENTIAL/PLATELET
Abs Immature Granulocytes: 0.76 10*3/uL — ABNORMAL HIGH (ref 0.00–0.07)
Basophils Absolute: 0.1 10*3/uL (ref 0.0–0.1)
Basophils Relative: 0 %
Eosinophils Absolute: 0.2 10*3/uL (ref 0.0–0.5)
Eosinophils Relative: 1 %
HCT: 26.5 % — ABNORMAL LOW (ref 39.0–52.0)
Hemoglobin: 8.2 g/dL — ABNORMAL LOW (ref 13.0–17.0)
Immature Granulocytes: 4 %
Lymphocytes Relative: 11 %
Lymphs Abs: 1.9 10*3/uL (ref 0.7–4.0)
MCH: 26.6 pg (ref 26.0–34.0)
MCHC: 30.9 g/dL (ref 30.0–36.0)
MCV: 86 fL (ref 80.0–100.0)
Monocytes Absolute: 0.8 10*3/uL (ref 0.1–1.0)
Monocytes Relative: 5 %
Neutro Abs: 14.1 10*3/uL — ABNORMAL HIGH (ref 1.7–7.7)
Neutrophils Relative %: 79 %
Platelets: 533 10*3/uL — ABNORMAL HIGH (ref 150–400)
RBC: 3.08 MIL/uL — ABNORMAL LOW (ref 4.22–5.81)
RDW: 14.1 % (ref 11.5–15.5)
WBC: 17.8 10*3/uL — ABNORMAL HIGH (ref 4.0–10.5)
nRBC: 0 % (ref 0.0–0.2)

## 2020-06-10 LAB — BASIC METABOLIC PANEL
Anion gap: 10 (ref 5–15)
BUN: 49 mg/dL — ABNORMAL HIGH (ref 6–20)
CO2: 18 mmol/L — ABNORMAL LOW (ref 22–32)
Calcium: 8.6 mg/dL — ABNORMAL LOW (ref 8.9–10.3)
Chloride: 108 mmol/L (ref 98–111)
Creatinine, Ser: 2.08 mg/dL — ABNORMAL HIGH (ref 0.61–1.24)
GFR, Estimated: 37 mL/min — ABNORMAL LOW (ref >60)
Glucose, Bld: 146 mg/dL — ABNORMAL HIGH (ref 70–99)
Potassium: 4.1 mmol/L (ref 3.5–5.1)
Sodium: 136 mmol/L (ref 135–145)

## 2020-06-10 LAB — GLUCOSE, CAPILLARY
Glucose-Capillary: 109 mg/dL — ABNORMAL HIGH (ref 70–99)
Glucose-Capillary: 105 mg/dL — ABNORMAL HIGH (ref 70–99)

## 2020-06-10 MED ORDER — LANTUS SOLOSTAR 100 UNIT/ML ~~LOC~~ SOPN: 28.0000 [IU] | PEN_INJECTOR | Freq: Two times a day (BID) | SUBCUTANEOUS | 1 refills | Status: DC

## 2020-06-10 MED ORDER — OXYCODONE-ACETAMINOPHEN 5-325 MG PO TABS: 1.0000 | ORAL_TABLET | ORAL | 0 refills | Status: DC | PRN

## 2020-06-10 MED ORDER — LOSARTAN POTASSIUM 100 MG PO TABS: 100.0000 mg | ORAL_TABLET | Freq: Every day | ORAL | 0 refills | Status: DC

## 2020-06-10 MED ORDER — INSULIN ASPART 100 UNIT/ML FLEXPEN: 10.0000 [IU] | PEN_INJECTOR | Freq: Three times a day (TID) | SUBCUTANEOUS | 1 refills | Status: DC

## 2020-06-10 MED ORDER — PANTOPRAZOLE SODIUM 40 MG PO TBEC: 40.0000 mg | DELAYED_RELEASE_TABLET | Freq: Every day | ORAL | 0 refills | Status: DC

## 2020-06-10 MED ORDER — INSULIN PEN NEEDLE 32G X 8 MM MISC: 1 refills | Status: DC

## 2020-06-10 MED ORDER — AMLODIPINE BESYLATE 10 MG PO TABS: 10.0000 mg | ORAL_TABLET | Freq: Every day | ORAL | 1 refills | Status: DC

## 2020-06-10 MED ORDER — ALLOPURINOL 100 MG PO TABS: 100.0000 mg | ORAL_TABLET | Freq: Two times a day (BID) | ORAL | 3 refills | Status: DC

## 2020-06-10 MED ORDER — AMOXICILLIN-POT CLAVULANATE 875-125 MG PO TABS: 1.0000 | ORAL_TABLET | Freq: Two times a day (BID) | ORAL | 0 refills | Status: AC

## 2020-06-10 MED ORDER — ZINC SULFATE 220 (50 ZN) MG PO CAPS: 220.0000 mg | ORAL_CAPSULE | Freq: Every day | ORAL | 0 refills | Status: AC

## 2020-06-10 MED ORDER — METOPROLOL TARTRATE 25 MG PO TABS: 25.0000 mg | ORAL_TABLET | Freq: Two times a day (BID) | ORAL | 0 refills | Status: DC

## 2020-06-10 MED ORDER — INSULIN DETEMIR 100 UNIT/ML ~~LOC~~ SOLN
60.0000 [IU] | Freq: Two times a day (BID) | SUBCUTANEOUS | Status: DC
  Administered 2020-06-10: 60 [IU] via SUBCUTANEOUS
  Filled 2020-06-10 (×2): qty 0.6

## 2020-06-10 MED ORDER — METOPROLOL TARTRATE 25 MG PO TABS
25.0000 mg | ORAL_TABLET | Freq: Two times a day (BID) | ORAL | Status: DC
  Administered 2020-06-10: 25 mg via ORAL
  Filled 2020-06-10: qty 1

## 2020-06-10 NOTE — Progress Notes (Signed)
Patient is postop day 2 status post ray amputation.  He is doing well he said physical therapy went better.  He is also taking his indomethacin for his gout which is provide him significant relief   Wound VAC is functioning.  Based on sensitivities Dr. Sharol Given has recommended 3 weeks of Augmentin I will call this into his pharmacy today patient does say he has enough indomethacin but is asking for allopurinol I will also call this in.  Also will call in pain medication.  We will need 1 week follow-up in our office. Patient is anxious to discharge today so he can attend his son's graduation this weekend.  From an orthopedic standpoint can discharge today

## 2020-06-10 NOTE — TOC Initial Note (Addendum)
Transition of Care [facility]) - Initial/Assessment Note    Patient Details  Name: Patrick [name] MRN: [id number] Date of Birth: 23-Aug-1965  [facility] CM/SW Contact:    Joanne Chars, LCSW Phone Number: 06/10/2020, 11:07 AM  Clinical Narrative:   CSW met with pt regarding discharge recommendations for [facility].  Pt agreeable, choice document given, no HH preference.  Discussed recommendation for wheelchair and walker.  Pt currently has no equipment, discussed insurance will not cover both, pt would like wheelchair and declines to self pay for walker, will obtain later.  Pt also declines 3n1.  Permission given to speak with friends/housemates Timmothy Sours and [place]: [phone].  PCP in place.  Pt is vaccinated for covid, no booster.    Proveena wound vac not in pt room.  Contacted PA Persons who suggested calling [facility]--CSW able to obtain wound vac from [facility].     CSW spoke with pt about medication costs.  When pt was uninsured, novolog was over $600 but with his new insurance it is $30.  Pt said he can afford this and has also received help from [place] with some meds, will work with them.  Pt does think he can afford medications now that he has coverage.   1130: OT has "adaptive equipment" recommendation--discussed with [employer], they do not provide this.  Wheel chair cost for pt is $475 plus $20 per month.  Walker is $100  Pt has not met deductable. CSW spoke with pt and he cannot afford this.  Spoke with PA Persons.  Scooter is not made for weight above 300lbs.  CSW asked to contact [facility] case managers, spoke with [facility] [employer] who confirms that if pt is insured, charity options are not available towards cost of bariatric wheelchair.  Discussed again with pt who declines wheelchair, does want to take the walker.                 Expected Discharge Plan: [place] Barriers to Discharge: No Barriers Identified   Patient Goals and CMS Choice Patient states their goals for  this hospitalization and ongoing recovery are:: "lose weight, walk normal" CMS Medicare.gov Compare Post Acute Care list provided to:: Patient Choice offered to / list presented to : Patient  Expected Discharge Plan and Services Expected Discharge Plan: [place] In-house Referral: Clinical Social Work   Post Acute Care Choice: [place] arrangements for the past 2 months: Single Family Home Expected Discharge Date: 06/10/20               DME Arranged: Other see comment (bariatric wheelchair) DME Agency: [employer] Date DME Agency Contacted: 06/10/20 Time DME Agency Contacted: [id number] Representative spoke with at DME Agency: [place]: PT,OT [place]: [place] ([place]) Date [place]: 06/10/20 Time [place]: 1030 Representative spoke with at [place]: Esmond Camper  Prior Living Arrangements/Services Living arrangements for the past 2 months: [place] with:: Friends Patient language and need for interpreter reviewed:: Yes Do you feel safe going back to the place where you live?: Yes      Need for Family Participation in Patient Care: No (Comment) Care giver support system in place?: Yes (comment) Current home services: Other (comment) (none) Criminal Activity/Legal Involvement Pertinent to Current Situation/Hospitalization: No - Comment as needed  Activities of Daily Living Home Assistive Devices/Equipment: None ADL Screening (condition at time of admission) Patient's cognitive ability adequate to safely complete daily activities?:  No Is the patient deaf or have difficulty hearing?: No Does the patient have difficulty seeing, even when wearing glasses/contacts?: No Does the patient have difficulty concentrating, remembering, or making decisions?: No Patient able to express need for assistance with ADLs?: Yes Does the patient have difficulty dressing or bathing?: No Independently performs ADLs?:  Yes (appropriate for developmental age) Does the patient have difficulty walking or climbing stairs?: No Weakness of Legs: None Weakness of Arms/Hands: None  Permission Sought/Granted Permission sought to share information with : Family Supports Permission granted to share information with : Yes, Verbal Permission Granted  Share Information with NAME: friends/housemates Timmothy Sours and Marita [place]: [phone]           Emotional Assessment Appearance:: Appears stated age Attitude/Demeanor/Rapport: Engaged Affect (typically observed): Appropriate,Pleasant Orientation: : Oriented to Self,Oriented to Place,Oriented to  Time,Oriented to Situation Alcohol / Substance Use: Not Applicable Psych Involvement: No (comment)  Admission diagnosis:  Sepsis due to cellulitis ([place]) [L03.90, A41.9] Sepsis with acute organ dysfunction, due to unspecified organism, unspecified type, unspecified whether septic shock present ([place]) [A41.9, R65.20] Patient Active Problem List   Diagnosis Date Noted  . Cutaneous abscess of right foot   . Osteomyelitis of fifth toe of right foot ([place])   . Severe protein-calorie malnutrition ([place])   . Sepsis due to cellulitis ([place]) 06/04/2020  . Diabetic foot ulcer ([place]) 06/04/2020  . Diabetic foot infection ([place]) 06/04/2020  . 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 Acute respiratory disease due to COVID-19 virus 05/03/2019  . Renal insufficiency 05/03/2019  . GOUT 01/27/2010  . OBESITY 01/27/2010  . Insulin-requiring or dependent type II diabetes mellitus ([place]) 02/20/2008  . HYPERTENSION, BENIGN ESSENTIAL 02/20/2008   PCP:  [facility] Pharmacy:   [facility] [place] - [address] [place] [address] Phone: [phone] Fax: [place], [place] [address] [place] [address] Phone:  [phone] Fax: [phone]  [place] [address] [place] [address] [place] [address] Phone: [phone] Fax: [phone]     Social Determinants of Health (SDOH) Interventions    Readmission Risk Interventions No flowsheet data found.

## 2020-06-10 NOTE — Progress Notes (Signed)
Occupational Therapy Treatment Patient Details Name: Patrick [name] MRN: [id number] DOB: [birth year] Today's Date: 06/10/2020    History of present illness 55 yo male presents to [facility] on 4/16 with R lateral foot wound x2 weeks, was septic upon arrival to ED. 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 PMH includes DM, HTN, gout.   OT comments  Pt in bed upon arrival and eager to bathe and get OOB.  PT and OT saw pt in conjunction to maximize transfer practice and work within pt tolerance prior to d/c. Pt sat EOB for UB bathing/dressnmog with set up (assisetd with hsi back), LB bathing/dressnih min - mod A. Pt has a very difficult time maintaining NWB RLE even with max cues and PT physically placing her foot underneath pt's during stand. OT and PT instructed pt in squat pivot transfer to and from recliner for safety and better maintenance of NWB precautions, encouraging pt to opt for this transfer especially when he has assist of someone to hold the w/c steady for him. Verbal cues provided during scoot transfer to lean bodyweight in opposite direction of transfer to offweight buttocks, pt understands and demonstrates good transfer quality although fatigues very quickly. Pt encouraged  to not travel this weekend, as he has limited transfer tolerance, inconsistently follows RLE NWB, and will have a very difficult time boarding/deboarding plane. Pt states he is going anyway. Plan is to d/c from hospital today. Pt with no further questions.  Follow Up Recommendations  Home health OT;Supervision - Intermittent    Equipment Recommendations  3 in 1 bedside commode;Other (comment);Wheelchair (measurements OT);Wheelchair cushion (measurements OT) (bariatric w/c and RW, ADL adaptive equipment)    Recommendations for Other Services      Precautions / Restrictions Precautions Precautions: Fall Required Braces or Orthoses: Other Brace Other Brace: darco shoe  donned Restrictions Weight Bearing Restrictions: Yes RLE Weight Bearing: Non weight bearing Other Position/Activity Restrictions: strict NWB RLE x4 weeks per Dr. Jess Barters note, ortho PA secure chatted to clarify, who states "go with (NWB) orders"       Mobility Bed Mobility Overal bed mobility: Needs Assistance Bed Mobility: Supine to Sit     Supine to sit: Supervision;HOB elevated     General bed mobility comments: increased time and effort, used rail    Transfers Overall transfer level: Needs assistance Equipment used: Rolling walker (2 wheeled);1 person hand held assist Transfers: Sit to/from Stand;Lateral/Scoot Transfers Sit to Stand: Min assist Stand pivot transfers: Min guard      Lateral/Scoot Transfers: Min guard;+2 safety/equipment General transfer comment: min assist for power up, PT placing her foot under pt's R foot to maintain NWB but pt unable to. STS x2, stand pivot x1, and lateral scoot x2 to and from drop arm recliner to ensure NWB RLE. OT holding recliner to minimize chair translation    Balance Overall balance assessment: Needs assistance Sitting-balance support: No upper extremity supported;Feet supported Sitting balance-Leahy Scale: Fair     Standing balance support: Bilateral upper extremity supported;During functional activity Standing balance-Leahy Scale: Poor                             ADL either performed or assessed with clinical judgement   ADL Overall ADL's : Needs assistance/impaired             Lower Body Bathing: Minimal assistance;Sitting/lateral leans Lower Body Bathing Details (indicate cue type and reason): sat EOB  for bathing Upper Body Dressing : Set up;Sitting   Lower Body Dressing: Minimal assistance;Sitting/lateral leans Lower Body Dressing Details (indicate cue type and reason): adjusted sock Toilet Transfer: Minimal assistance;Min guard;RW;Stand-pivot;Cueing for safety Toilet Transfer Details (indicate cue  type and reason): simulated to recliner Toileting- Clothing Manipulation and Hygiene: Minimal assistance;Sitting/lateral lean       Functional mobility during ADLs: Minimal assistance;Min guard;Rolling walker;Cueing for safety General ADL Comments: pt sat EOB for bathing and dresssing     Vision Patient Visual Report: No change from baseline     Perception     Praxis      Cognition Arousal/Alertness: Awake/alert Behavior During Therapy: WFL for tasks assessed/performed Overall Cognitive Status: Within Functional Limits for tasks assessed                                 General Comments: lacks insight into deficits, does not understand        Exercises     Shoulder Instructions       General Comments wound vac RLE    Pertinent Vitals/ Pain       Pain Assessment: Faces Faces Pain Scale: Hurts a little bit Pain Location: LLE from gout flare Pain Descriptors / Indicators: Sore;Discomfort;Grimacing Pain Intervention(s): Limited activity within patient's tolerance;Monitored during session;Repositioned;Premedicated before session  Home Living                                          Prior Functioning/Environment              Frequency  Min 2X/week        Progress Toward Goals  OT Goals(current goals can now be found in the care plan section)  Progress towards OT goals: Progressing toward goals  Acute Rehab OT Goals Patient Stated Goal: go to my son's graduation on [place] Discharge plan remains appropriate    Co-evaluation      Reason for Co-Treatment: For patient/therapist safety;To address functional/ADL transfers PT goals addressed during session: Mobility/safety with mobility;Balance;Proper use of DME OT goals addressed during session: ADL's and self-care;Proper use of Adaptive equipment and DME      AM-PAC OT "6 Clicks" Daily Activity     Outcome Measure   Help from another person eating meals?:  None Help from another person taking care of personal grooming?: None Help from another person toileting, which includes using toliet, bedpan, or urinal?: A Lot Help from another person bathing (including washing, rinsing, drying)?: A Little Help from another person to put on and taking off regular upper body clothing?: None Help from another person to put on and taking off regular lower body clothing?: A Lot 6 Click Score: 19    End of Session Equipment Utilized During Treatment: Rolling walker;Other (comment) (R Darco shoe)  OT Visit Diagnosis: Unsteadiness on feet (R26.81);Other abnormalities of gait and mobility (R26.89);Pain Pain - Right/Left: Right Pain - part of body: Leg;Ankle and joints of foot   Activity Tolerance Patient limited by fatigue   Patient Left with call bell/phone within reach;with nursing/sitter in room;in chair   Nurse Communication          Time: [id number] OT Time Calculation (min): 35 min  Charges: OT General Charges $OT Visit: 1 Visit OT Treatments $Self Care/Home Management : 8-22 mins     Emmit Alexanders  Jeanette 06/10/2020, 12:33 PM

## 2020-06-10 NOTE — Progress Notes (Signed)
Physical Therapy Treatment Patient Details Name: Patrick [name] MRN: [id number] DOB: 04-02-1965 Today's Date: 06/10/2020    History of Present Illness 55 yo male presents to [facility] on 4/16 with R lateral foot wound x2 weeks, was septic upon arrival to ED. 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 PMH includes DM, HTN, gout.    PT Comments    Pt resting in supine upon PT and OT arrival, PT and OT saw pt in conjunction to maximize transfer practice and work within pt tolerance prior to d/c. Pt has a very difficult time maintaining NWB RLE even with max PT cues and PT physically placing her foot underneath pt's during stand. PT and OT instructed pt in squat pivot transfer to and from recliner for safety and better maintenance of NWB precautions, encouraging pt to opt for this transfer especially when he has assist of someone to hold the w/c steady for him. PT cued pt during scoot transfer to lean bodyweight in opposite direction of transfer to offweight buttocks, pt understands and demonstrates good transfer quality although fatigues very quickly. PT and OT encouraged pt to not travel this weekend, as he has limited transfer tolerance, inconsistently follows RLE NWB, and will have a very difficult time boarding/deboarding plane. Pt states he is going anyway. Pt with no further questions, plans to d/c today.    Follow Up Recommendations  Home health PT;Supervision for mobility/OOB     Equipment Recommendations  Rolling walker with 5" wheels;3in1 (PT);Wheelchair (measurements PT);Wheelchair cushion (measurements PT) (bariatric RW and w/c)    Recommendations for Other Services       Precautions / Restrictions Precautions Precautions: Fall Required Braces or Orthoses: Other Brace Other Brace: darco shoe donned Restrictions Other Position/Activity Restrictions: strict NWB RLE x4 weeks per Dr. Jess Barters note, ortho PA secure chatted to clarify, who states "go with (NWB)  orders"    Mobility  Bed Mobility Overal bed mobility: Needs Assistance Bed Mobility: Supine to Sit     Supine to sit: Supervision;HOB elevated     General bed mobility comments: increased time and effort, used rail    Transfers Overall transfer level: Needs assistance Equipment used: Rolling walker (2 wheeled);1 person hand held assist Transfers: Sit to/from Stand;Lateral/Scoot Transfers Sit to Stand: Min assist        Lateral/Scoot Transfers: Min guard;+2 safety/equipment General transfer comment: min assist for power up, PT placing her foot under pt's R foot to maintain NWB but pt unable to. STS x2, stand pivot x1, and lateral scoot x2 to and from drop arm recliner to ensure NWB RLE. OT holding recliner to minimize chair translation  Ambulation/Gait                 Stairs             Wheelchair Mobility    Modified Rankin (Stroke Patients Only)       Balance Overall balance assessment: Needs assistance Sitting-balance support: No upper extremity supported;Feet supported Sitting balance-Leahy Scale: Fair     Standing balance support: Bilateral upper extremity supported;During functional activity Standing balance-Leahy Scale: Poor                              Cognition Arousal/Alertness: Awake/alert Behavior During Therapy: WFL for tasks assessed/performed Overall Cognitive Status: Within Functional Limits for tasks assessed  General Comments: lacks insight into deficits, does not understand      Exercises      General Comments General comments (skin integrity, edema, etc.): wound vac RLE      Pertinent Vitals/Pain Pain Assessment: Faces Faces Pain Scale: Hurts a little bit Pain Location: LLE from gout flare Pain Descriptors / Indicators: Sore;Discomfort;Grimacing Pain Intervention(s): Limited activity within patient's tolerance;Monitored during session;Repositioned    Home  Living                      Prior Function            PT Goals (current goals can now be found in the care plan section) Acute Rehab PT Goals Patient Stated Goal: go to my son's graduation on saturday PT Goal Formulation: With patient Time For Goal Achievement: 06/23/20 Potential to Achieve Goals: Fair Progress towards PT goals: Progressing toward goals    Frequency    Min 3X/week      PT Plan Current plan remains appropriate    Co-evaluation PT/OT/SLP Co-Evaluation/Treatment: Yes Reason for Co-Treatment: For patient/therapist safety;To address functional/ADL transfers PT goals addressed during session: Mobility/safety with mobility;Balance;Proper use of DME        AM-PAC PT "6 Clicks" Mobility   Outcome Measure  Help needed turning from your back to your side while in a flat bed without using bedrails?: A Little Help needed moving from lying on your back to sitting on the side of a flat bed without using bedrails?: A Little Help needed moving to and from a bed to a chair (including a wheelchair)?: A Little Help needed standing up from a chair using your arms (e.g., wheelchair or bedside chair)?: A Little Help needed to walk in hospital room?: A Lot Help needed climbing 3-5 steps with a railing? : A Lot 6 Click Score: 16    End of Session Equipment Utilized During Treatment: Other (comment) (R darco shoe) Activity Tolerance: Patient limited by fatigue;Patient limited by pain Patient left: in chair;with call bell/phone within reach;Other (comment) (pt verbalizes he will press call button and wait for assist prior to mobilizing back to bed) Nurse Communication: Mobility status;Weight bearing status PT Visit Diagnosis: Other abnormalities of gait and mobility (R26.89);Difficulty in walking, not elsewhere classified (R26.2)     Time: [id number] PT Time Calculation (min) (ACUTE ONLY): 37 min  Charges:  $Therapeutic Activity: 8-22 mins                     Stacie Glaze, PT DPT Acute Rehabilitation Services Pager [phone]  Office [phone]   Roxine Caddy E Ruffin Pyo 06/10/2020, 12:04 PM

## 2020-06-10 NOTE — TOC Transition Note (Signed)
Transition of Care [facility]) - CM/SW Discharge Note   Patient Details  Name: Patrick [name] MRN: [id number] Date of Birth: 16-Jan-1966  [facility]) CM/SW Contact:  Joanne Chars, LCSW Phone Number: 06/10/2020, 2:22 PM   Clinical Narrative:   Pt discharging home with [facility].  Wound vac in place.  DME walker through [employer].  Pt does have transportation home.  No other needs identified.      Final next level of care: [place] Barriers to Discharge: No Barriers Identified   Patient Goals and CMS Choice Patient states their goals for this hospitalization and ongoing recovery are:: "lose weight, walk normal" CMS Medicare.gov Compare Post Acute Care list provided to:: Patient Choice offered to / list presented to : Patient  Discharge Placement                       Discharge Plan and Services In-house Referral: Clinical Social Work   Post Acute Care Choice: Home Health          DME Arranged: Gilford Rile (bariatric) DME Agency: [employer] Date DME Agency Contacted: 06/10/20 Time DME Agency Contacted: [id number] Representative spoke with at DME Agency: [place]: PT,OT [place] Agency: [place] ([place]) Date [place]: 06/10/20 Time [place]: 1030 Representative spoke with at [place]: [place] ([place]) Interventions     Readmission Risk Interventions No flowsheet data found.

## 2020-06-10 NOTE — Discharge Summary (Signed)
Physician Discharge Summary  Patrick [name] AGT:[id number] DOB: [birth year] DOA: 06/04/2020  PCP: Beaulah Corin, [facility]  Admit date: 06/04/2020 Discharge date: 06/10/2020 30 Day Unplanned Readmission Risk Score   Flowsheet Row ED to Hosp-Admission (Current) from 06/04/2020 in [facility]  30 Day Unplanned Readmission Risk Score (%) 13.85 Filed at 06/10/2020 0801     This score is the patient's risk of an unplanned readmission within 30 days of being discharged (0 -100%). The score is based on dignosis, age, lab data, medications, orders, and past utilization.   Low:  0-14.9   Medium: 15-21.9   High: 22-29.9   Extreme: 30 and above         Admitted From: Home Disposition: Home  Recommendations for Outpatient Follow-up:  1. Follow up with PCP in 1-2 weeks 2. Follow-up with Dr. Sharol Given in 1 week 3. Please obtain BMP/CBC in one week 4. Please follow up with your PCP on the following pending results: Unresulted Labs (From admission, onward)          Start     Ordered   06/06/20 0500  CBC with Differential/Platelet  Daily,   R      06/05/20 1054            Home Health: Yes Equipment/Devices: Bariatric wheelchair  Discharge Condition: Stable CODE STATUS: Full code Diet recommendation: Cardiac and diabetic  Subjective: Seen and examined.  No pain.  No complaints.  Feels better since he is taking indomethacin.  Brief/Interim Summary: Patrick Henricksen Allenis a 55 y.o.malewith medical history significant ofDM2, HTN.  Who presented to ED with wound to the right lateral foot for 2 weeks.  No fever.  Minimal pain. Has not been on insulins at all recently due to cost and no insurance until recently.  Upon arrival to ED, patient was septic with Tm 103.1, HR 122, RR 30, WBC 19.4k.  Lactic acid were normal.  Chest x-ray normal.  Covid negative.  He was admitted to hospital service for sepsis secondary to right foot abscess and was started on cefepime Flagyl as well as  vancomycin and orthopedics were consulted.  He underwent amputation of the fifth ray right foot on 06/06/2020 and then he had amputation of the right fourth ray on 06/08/2020.  Wound VAC was attached.  His blood culture remain negative however his culture from the foot abscess was growing Enterococcus faecalis which was pansensitive.  He was seen by orthopedics and they have cleared the patient for discharge and have prescribed him Augmentin for 3 weeks as well as pain medications and they will arrange wound VAC as well.  They will see him in clinic in 1 week.  Patient also came in with what was thought to be AKI on CKD stage IIIa.  His previous baseline creatinine seems to be 1.5-1.7.  He presented with 2.14.  However his creatinine has remained stable around 2.  It appears that patient's new baseline creatinine is going to be between 2 and 2.1 making him CKD stage IV now with GFR around 37.  Patient's losartan and hydrochlorothiazide were held during this hospitalization.  Based off of slight elevated creatinine, I have taken the liberty and I have discontinue his hydrochlorothiazide but resuming his losartan at his home dose.  His blood sugars/diabetes was also uncontrolled with hyperglycemia.  His Lantus was escalated and currently his blood sugars fairly stable on 60 units of Lantus twice daily.  That is what he is going home with.  He also had slightly elevated blood pressure with some tachycardia for which I have started him on metoprolol.  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 Should call office if vac alarms or stops working     Allergies as of 06/10/2020   No Known Allergies     Medication List    STOP taking these medications   losartan-hydrochlorothiazide 100-25 MG tablet Commonly known as: HYZAAR     TAKE these medications   allopurinol 100 MG tablet Commonly known as: ZYLOPRIM Take 1 tablet (100 mg total) by mouth 2 (two) times daily.   amLODipine 10 MG tablet Commonly known as: NORVASC Take 1 tablet (10 mg total) by mouth daily.   amoxicillin-clavulanate 875-125 MG tablet Commonly known as: Augmentin Take 1 tablet by mouth 2 (two) times daily for 21 days.   blood glucose meter kit and supplies Dispense based on patient and insurance preference. Use up to four times daily as directed. (FOR ICD-10 E10.9, E11.9).   gabapentin 100 MG capsule Commonly known as: NEURONTIN Take 100 mg by mouth every evening.   insulin aspart 100 UNIT/ML FlexPen Commonly known as: NOVOLOG Inject 10 Units into the skin 3 (three) times daily with meals.   insulin detemir 100 UNIT/ML FlexPen Commonly known as: LEVEMIR 60 units subcutaneously in a.m., and 28 units in p.m.   Insulin Pen Needle 32G X 8 MM Misc Use as directed   losartan 100 MG tablet Commonly known as: Cozaar Take 1 tablet (100 mg total) by mouth daily.   metFORMIN 500 MG tablet Commonly known as: GLUCOPHAGE Take 1 tablet (500 mg total) by mouth 2 (two) times daily with a meal.   metoprolol tartrate 25 MG tablet Commonly known as: LOPRESSOR Take 1 tablet (25 mg total) by mouth 2 (two) times daily.   oxyCODONE-acetaminophen 5-325 MG tablet Commonly known as: Percocet Take 1 tablet by mouth every 4 (four) hours as needed.    pantoprazole 40 MG tablet Commonly known as: PROTONIX Take 1 tablet (40 mg total) by mouth daily. Start taking on: June 11, 2020   tiZANidine 4 MG tablet Commonly known as: ZANAFLEX Take 4 mg by mouth at bedtime.   zinc sulfate 220 (50 Zn) MG capsule Take 1 capsule (220 mg total) by mouth daily for 11 days. Start taking on: June 11, 2020       Follow-up Information    Zamora, Erin R, NP In 1 week.   Specialty: Orthopedic Surgery Contact information: [address] [place] [address] [phone]        [facility], [facility] Follow up in 1 week(s).   Specialty: Internal Medicine Contact information: [address] [place] [address] [phone]                No Known Allergies  Consultations: Orthopedics   Procedures/Studies: DG Chest Port 1 View  Result Date: 06/04/2020 CLINICAL DATA:  Wound check EXAM: PORTABLE CHEST 1 VIEW COMPARISON:  05/02/2019 FINDINGS: Cardiac shadow is within normal limits. The lungs are clear bilaterally. No bony abnormality is noted. IMPRESSION: No acute abnormality seen. Electronically Signed   By: Mark  Lukens M.D.   On: 06/04/2020 17:42   DG Foot 2 Views Right  Result Date: 06/04/2020 CLINICAL DATA:  Foot pain, known soft tissue wound EXAM: RIGHT FOOT - 2 VIEW COMPARISON:  None. FINDINGS: Considerable subcutaneous air is noted about the distal aspect of the fifth metatarsal and fifth digit and to a lesser degree about the fourth digit. Degenerative changes of the first MTP joint, tarsal bones and calcaneus are noted. No definitive erosive changes are noted at this time. IMPRESSION: Considerable subcutaneous air consistent with the known history of wound. No bony erosive changes are seen. MRI would be more sensitive for underlying osteomyelitis in this regard. Electronically Signed   By: Mark  Lukens M.D.   On: 06/04/2020 17:42   VAS US ABI WITH/WO TBI  Result Date: 06/05/2020 LOWER EXTREMITY DOPPLER STUDY Indications:  Ulceration, and gangrene. High Risk Factors: Hypertension, Diabetes.  Comparison Study: No prior study on file Performing Technologist: Kanady, Candace RVS  Examination Guidelines: A complete evaluation includes at minimum, Doppler waveform signals and systolic blood pressure reading at the level of bilateral brachial, anterior tibial, and posterior tibial arteries, when vessel segments are accessible. Bilateral testing is considered an integral part of a complete examination. Photoelectric Plethysmograph (PPG) waveforms and toe systolic pressure readings are included as required and additional duplex testing as needed. Limited examinations for reoccurring indications may be performed as noted.  ABI Findings: +---------+------------------+-----+-----------+----------------------+ Right    Rt Pressure (mmHg)IndexWaveform   Comment                +---------+------------------+-----+-----------+----------------------+ Brachial 161                    multiphasic                       +---------+------------------+-----+-----------+----------------------+ PTA      190               1.18 multiphasic                       +---------+------------------+-----+-----------+----------------------+ DP       186               1.16 multiphasic                       +---------+------------------+-----+-----------+----------------------+ Great Toe                                  toe too large for cuff +---------+------------------+-----+-----------+----------------------+ +---------+------------------+-----+-----------+----------------------+ Left     Lt Pressure (mmHg)IndexWaveform   Comment                +---------+------------------+-----+-----------+----------------------+ Brachial 160                    multiphasic                       +---------+------------------+-----+-----------+----------------------+ PTA      183                 1.14 multiphasic                        +---------+------------------+-----+-----------+----------------------+ DP       183               1.14 multiphasic                       +---------+------------------+-----+-----------+----------------------+ Great Toe                                  toe too large for cuff +---------+------------------+-----+-----------+----------------------+ +-------+-----------+-----------+------------+------------+ ABI/TBIToday's ABIToday's TBIPrevious ABIPrevious TBI +-------+-----------+-----------+------------+------------+ Right  1.18                                           +-------+-----------+-----------+------------+------------+ Left   1.14                                           +-------+-----------+-----------+------------+------------+  Summary: Right: Resting right ankle-brachial index is within normal range. No evidence of significant right lower extremity arterial disease. Left: Resting left ankle-brachial index is within normal range. No evidence of significant left lower extremity arterial disease.  *See table(s) above for measurements and observations.  Electronically signed by Brandon Cain MD on 06/05/2020 at 1:58:53 PM.    Final       Discharge Exam: Vitals:   06/10/20 0520 06/10/20 0738  BP: 122/80 (!) 138/94  Pulse: (!) 107 (!) 105  Resp: 15 (!) 21  Temp: 98.3 F (36.8 C)   SpO2: 99% 96%   Vitals:   06/09/20 2025 06/10/20 0105 06/10/20 0520 06/10/20 0738  BP: 135/85 112/75 122/80 (!) 138/94  Pulse:  (!) 110 (!) 107 (!) 105  Resp: 18 17 15 (!) 21  Temp: 98.1 F (36.7 C) 98 F (36.7 C) 98.3 F (36.8 C)   TempSrc: Oral Oral Oral   SpO2: 98% 99% 99% 96%  Weight:      Height:        General: Pt is alert, awake, not in acute distress, morbidly obese Cardiovascular: RRR, S1/S2 +, no rubs, no gallops Respiratory: CTA bilaterally, no wheezing, no rhonchi Abdominal: Soft, NT, ND, bowel sounds + Extremities: Dressing in the right foot with wound VAC  attached.    The results of significant diagnostics from this hospitalization (including imaging, microbiology, ancillary and laboratory) are listed below for reference.     Microbiology: Recent Results (from the past 240 hour(s))  SARS CORONAVIRUS 2 (TAT 6-24 HRS) Nasopharyngeal Nasopharyngeal Swab     Status: None   Collection Time: 06/04/20  4:26 PM   Specimen: Nasopharyngeal Swab  Result Value Ref Range Status   SARS Coronavirus 2 NEGATIVE NEGATIVE Final    Comment: (NOTE) SARS-CoV-2 target nucleic acids are NOT DETECTED.  The SARS-CoV-2 RNA is generally detectable in upper and lower respiratory specimens during the acute phase of infection. Negative results do not preclude SARS-CoV-2 infection, do not rule out co-infections with other pathogens, and should not be used as the sole basis for treatment or other patient management decisions. Negative results must be combined with clinical observations, patient history, and epidemiological information. The expected result is Negative.  Fact Sheet   for Patients: https://www.fda.gov/media/138098/download  Fact Sheet for Healthcare Providers: https://www.fda.gov/media/138095/download  This test is not yet approved or cleared by the [place] FDA and  has been authorized for detection and/or diagnosis of SARS-CoV-2 by FDA under an Emergency Use Authorization (EUA). This EUA will remain  in effect (meaning this test can be used) for the duration of the COVID-19 declaration under Se ction 564(b)(1) of the Act, 21 U.S.C. section 360bbb-3(b)(1), unless the authorization is terminated or revoked sooner.  Performed at [facility], [address] [place], [place] [address]   Blood Culture (routine x 2)     Status: None   Collection Time: 06/04/20  5:55 PM   Specimen: BLOOD  Result Value Ref Range Status   Specimen Description BLOOD SITE NOT SPECIFIED  Final   Special Requests   Final    BOTTLES DRAWN AEROBIC AND  ANAEROBIC Blood Culture results may not be optimal due to an inadequate volume of blood received in culture bottles   Culture   Final    NO GROWTH 5 DAYS Performed at [facility], [address] [place], [place] [address]    Report Status 06/09/2020 FINAL  Final  Blood Culture (routine x 2)     Status: None   Collection Time: 06/04/20  5:56 PM   Specimen: BLOOD  Result Value Ref Range Status   Specimen Description BLOOD SITE NOT SPECIFIED  Final   Special Requests   Final    BOTTLES DRAWN AEROBIC AND ANAEROBIC Blood Culture results may not be optimal due to an inadequate volume of blood received in culture bottles   Culture   Final    NO GROWTH 5 DAYS Performed at [facility], [address] [place], [place] [address]    Report Status 06/09/2020 FINAL  Final  Urine culture     Status: Abnormal   Collection Time: 06/05/20  3:35 AM   Specimen: In/Out Cath Urine  Result Value Ref Range Status   Specimen Description IN/OUT CATH URINE  Final   Special Requests   Final    NONE Performed at [facility], [address] [place], [place] [address]    Culture MULTIPLE SPECIES PRESENT, SUGGEST RECOLLECTION (A)  Final   Report Status 06/06/2020 FINAL  Final  MRSA PCR Screening     Status: None   Collection Time: 06/05/20  7:51 AM   Specimen: Nasopharyngeal  Result Value Ref Range Status   MRSA by PCR NEGATIVE NEGATIVE Final    Comment:        The GeneXpert MRSA Assay (FDA approved for NASAL specimens only), is one component of a comprehensive MRSA colonization surveillance program. It is not intended to diagnose MRSA infection nor to guide or monitor treatment for MRSA infections. Performed at [facility], [address] [place], [place] [address]   Aerobic/Anaerobic Culture w Gram Stain (surgical/deep wound)     Status: None (Preliminary result)   Collection Time: 06/06/20  3:49 PM   Specimen: Abscess  Result Value Ref Range Status   Specimen  Description ABSCESS  Final   Special Requests RIGHT FIFTH TOE (RAY)  Final   Gram Stain   Final    RARE WBC PRESENT, PREDOMINANTLY PMN ABUNDANT GRAM POSITIVE COCCI RARE GRAM POSITIVE RODS Performed at [facility], [address] [place], [place] [address]    Culture   Final    ABUNDANT ENTEROCOCCUS FAECALIS CULTURE REINCUBATED FOR BETTER GROWTH   NO ANAEROBES ISOLATED; CULTURE IN PROGRESS FOR 5 DAYS    Report Status PENDING  Incomplete   Organism ID, Bacteria ENTEROCOCCUS FAECALIS  Final      Susceptibility   Enterococcus faecalis - MIC*    AMPICILLIN <=2 SENSITIVE Sensitive     VANCOMYCIN 2 SENSITIVE Sensitive     GENTAMICIN SYNERGY SENSITIVE Sensitive     * ABUNDANT ENTEROCOCCUS FAECALIS     Labs: BNP (last 3 results) No results for input(s): BNP in the last 8760 hours. Basic Metabolic Panel: Recent Labs  Lab 06/06/20 0353 06/07/20 0249 06/08/20 0427 06/09/20 0126 06/10/20 0143  NA 131* 132* 133* 133* 136  K 3.9 4.7 4.5 4.7 4.1  CL 101 102 104 105 108  CO2 20* 19* 20* 18* 18*  GLUCOSE 223* 374* 259* 348* 146*  BUN 25* 29* 39* 43* 49*  CREATININE 2.33* 2.35* 2.00* 2.10* 2.08*  CALCIUM 8.2* 8.5* 8.9 8.5* 8.6*  MG 1.3*  --   --   --   --    Liver Function Tests: Recent Labs  Lab 06/04/20 1626 06/05/20 0557 06/06/20 0353 06/07/20 0249  AST 25 25 17 17  ALT 19 20 19 17  ALKPHOS 123 113 116 137*  BILITOT 0.9 0.8 0.9 1.0  PROT 7.1 6.1* 6.4* 6.2*  ALBUMIN 2.7* 2.3* 2.3* 2.1*   No results for input(s): LIPASE, AMYLASE in the last 168 hours. No results for input(s): AMMONIA in the last 168 hours. CBC: Recent Labs  Lab 06/06/20 0353 06/07/20 0249 06/08/20 0427 06/09/20 0126 06/10/20 0143  WBC 16.7* 20.9* 19.8* 19.8* 17.8*  NEUTROABS 13.1* 18.4* 15.8* 17.1* 14.1*  HGB 8.3* 9.0* 8.6* 8.2* 8.2*  HCT 26.7* 28.8* 27.5* 26.5* 26.5*  MCV 85.3 85.2 84.1 86.6 86.0  PLT 398 437* 470* 478* 533*   Cardiac Enzymes: No results for input(s): CKTOTAL, CKMB,  CKMBINDEX, TROPONINI in the last 168 hours. BNP: Invalid input(s): POCBNP CBG: Recent Labs  Lab 06/09/20 0732 06/09/20 1126 06/09/20 1620 06/09/20 2029 06/10/20 0736  GLUCAP 261* 188* 168* 150* 109*   D-Dimer No results for input(s): DDIMER in the last 72 hours. Hgb A1c No results for input(s): HGBA1C in the last 72 hours. Lipid Profile No results for input(s): CHOL, HDL, LDLCALC, TRIG, CHOLHDL, LDLDIRECT in the last 72 hours. Thyroid function studies No results for input(s): TSH, T4TOTAL, T3FREE, THYROIDAB in the last 72 hours.  Invalid input(s): FREET3 Anemia work up No results for input(s): VITAMINB12, FOLATE, FERRITIN, TIBC, IRON, RETICCTPCT in the last 72 hours. Urinalysis    Component Value Date/Time   COLORURINE YELLOW 05/03/2019 1545   APPEARANCEUR CLEAR 05/03/2019 1545   LABSPEC 1.018 05/03/2019 1545   PHURINE 5.0 05/03/2019 1545   GLUCOSEU >=500 (A) 05/03/2019 1545   HGBUR SMALL (A) 05/03/2019 1545   HGBUR negative 01/27/2010 0934   BILIRUBINUR NEGATIVE 05/03/2019 1545   KETONESUR NEGATIVE 05/03/2019 1545   PROTEINUR 100 (A) 05/03/2019 1545   UROBILINOGEN 0.2 10/26/2013 1130   NITRITE NEGATIVE 05/03/2019 1545   LEUKOCYTESUR NEGATIVE 05/03/2019 1545   Sepsis Labs Invalid input(s): PROCALCITONIN,  WBC,  LACTICIDVEN Microbiology Recent Results (from the past 240 hour(s))  SARS CORONAVIRUS 2 (TAT 6-24 HRS) Nasopharyngeal Nasopharyngeal Swab     Status: None   Collection Time: 06/04/20  4:26 PM   Specimen: Nasopharyngeal Swab  Result Value Ref Range Status   SARS Coronavirus 2 NEGATIVE NEGATIVE Final    Comment: (NOTE) SARS-CoV-2 target nucleic acids are NOT DETECTED.  The SARS-CoV-2 RNA is   generally detectable in upper and lower respiratory specimens during the acute phase of infection. Negative results do not preclude SARS-CoV-2 infection, do not rule out co-infections with other pathogens, and should not be used as the sole basis for treatment or  other patient management decisions. Negative results must be combined with clinical observations, patient history, and epidemiological information. The expected result is Negative.  Fact Sheet for Patients: https://www.fda.gov/media/138098/download  Fact Sheet for Healthcare Providers: https://www.fda.gov/media/138095/download  This test is not yet approved or cleared by the [place] FDA and  has been authorized for detection and/or diagnosis of SARS-CoV-2 by FDA under an Emergency Use Authorization (EUA). This EUA will remain  in effect (meaning this test can be used) for the duration of the COVID-19 declaration under Se ction 564(b)(1) of the Act, 21 U.S.C. section 360bbb-3(b)(1), unless the authorization is terminated or revoked sooner.  Performed at [facility], [address] [place], [place] [address]   Blood Culture (routine x 2)     Status: None   Collection Time: 06/04/20  5:55 PM   Specimen: BLOOD  Result Value Ref Range Status   Specimen Description BLOOD SITE NOT SPECIFIED  Final   Special Requests   Final    BOTTLES DRAWN AEROBIC AND ANAEROBIC Blood Culture results may not be optimal due to an inadequate volume of blood received in culture bottles   Culture   Final    NO GROWTH 5 DAYS Performed at [facility], [address] [place], [place] [address]    Report Status 06/09/2020 FINAL  Final  Blood Culture (routine x 2)     Status: None   Collection Time: 06/04/20  5:56 PM   Specimen: BLOOD  Result Value Ref Range Status   Specimen Description BLOOD SITE NOT SPECIFIED  Final   Special Requests   Final    BOTTLES DRAWN AEROBIC AND ANAEROBIC Blood Culture results may not be optimal due to an inadequate volume of blood received in culture bottles   Culture   Final    NO GROWTH 5 DAYS Performed at [facility], [address] [place], [place] [address]    Report Status 06/09/2020 FINAL  Final  Urine culture     Status: Abnormal    Collection Time: 06/05/20  3:35 AM   Specimen: In/Out Cath Urine  Result Value Ref Range Status   Specimen Description IN/OUT CATH URINE  Final   Special Requests   Final    NONE Performed at [facility], [address] [place], [place] [address]    Culture MULTIPLE SPECIES PRESENT, SUGGEST RECOLLECTION (A)  Final   Report Status 06/06/2020 FINAL  Final  MRSA PCR Screening     Status: None   Collection Time: 06/05/20  7:51 AM   Specimen: Nasopharyngeal  Result Value Ref Range Status   MRSA by PCR NEGATIVE NEGATIVE Final    Comment:        The GeneXpert MRSA Assay (FDA approved for NASAL specimens only), is one component of a comprehensive MRSA colonization surveillance program. It is not intended to diagnose MRSA infection nor to guide or monitor treatment for MRSA infections. Performed at [facility], [address] [place], [place] [address]   Aerobic/Anaerobic Culture w Gram Stain (surgical/deep wound)     Status: None (Preliminary result)   Collection Time: 06/06/20  3:49 PM   Specimen: Abscess  Result Value Ref Range Status   Specimen Description ABSCESS    Final   Special Requests RIGHT FIFTH TOE (RAY)  Final   Gram Stain   Final    RARE WBC PRESENT, PREDOMINANTLY PMN ABUNDANT GRAM POSITIVE COCCI RARE GRAM POSITIVE RODS Performed at [facility], [address] [place], [place] [address]    Culture   Final    ABUNDANT ENTEROCOCCUS FAECALIS CULTURE REINCUBATED FOR BETTER GROWTH NO ANAEROBES ISOLATED; CULTURE IN PROGRESS FOR 5 DAYS    Report Status PENDING  Incomplete   Organism ID, Bacteria ENTEROCOCCUS FAECALIS  Final      Susceptibility   Enterococcus faecalis - MIC*    AMPICILLIN <=2 SENSITIVE Sensitive     VANCOMYCIN 2 SENSITIVE Sensitive     GENTAMICIN SYNERGY SENSITIVE Sensitive     * ABUNDANT ENTEROCOCCUS FAECALIS     Time coordinating discharge: Over 30 minutes  SIGNED:    , MD  [facility] 06/10/2020,  10:45 AM  If 7PM-7AM, please contact night-coverage [email or web address]   

## 2020-06-10 NOTE — Plan of Care (Signed)
  Problem: Education: Goal: Knowledge of General Education information will improve Description: Including pain rating scale, medication(s)/side effects and non-pharmacologic comfort measures Outcome: Adequate for Discharge   Problem: Health Behavior/Discharge Planning: Goal: Ability to manage health-related needs will improve Outcome: Adequate for Discharge   Problem: Clinical Measurements: Goal: Ability to maintain clinical measurements within normal limits will improve Outcome: Adequate for Discharge Goal: Will remain free from infection Outcome: Adequate for Discharge Goal: Diagnostic test results will improve Outcome: Adequate for Discharge Goal: Respiratory complications will improve Outcome: Adequate for Discharge Goal: Cardiovascular complication will be avoided Outcome: Adequate for Discharge   Problem: Activity: Goal: Risk for activity intolerance will decrease Outcome: Adequate for Discharge   Problem: Nutrition: Goal: Adequate nutrition will be maintained Outcome: Adequate for Discharge   Problem: Coping: Goal: Level of anxiety will decrease Outcome: Adequate for Discharge   Problem: Elimination: Goal: Will not experience complications related to bowel motility Outcome: Adequate for Discharge Goal: Will not experience complications related to urinary retention Outcome: Adequate for Discharge   Problem: Pain Managment: Goal: General experience of comfort will improve Outcome: Adequate for Discharge   Problem: Safety: Goal: Ability to remain free from injury will improve Outcome: Adequate for Discharge   Problem: Skin Integrity: Goal: Risk for impaired skin integrity will decrease Outcome: Adequate for Discharge   Problem: Increased Nutrient Needs (NI-5.1) Goal: Food and/or nutrient delivery Description: Individualized approach for food/nutrient provision. Outcome: Adequate for Discharge   Problem: Acute Rehab PT Goals(only PT should resolve) Goal:  Pt Will Go Supine/Side To Sit Outcome: Adequate for Discharge Goal: Patient Will Transfer Sit To/From Stand Outcome: Adequate for Discharge Goal: Pt Will Transfer Bed To Chair/Chair To Bed Outcome: Adequate for Discharge Goal: Pt Will Verbalize and Adhere to Precautions While Description: PT Will Verbalize and Adhere to Precautions While Performing Mobility Outcome: Adequate for Discharge Goal: Pt/caregiver will Perform Home Exercise Program Outcome: Adequate for Discharge   Problem: Acute Rehab OT Goals (only OT should resolve) Goal: Pt. Will Perform Lower Body Bathing Outcome: Adequate for Discharge Goal: Pt. Will Perform Lower Body Dressing Outcome: Adequate for Discharge Goal: Pt. Will Transfer To Toilet Outcome: Adequate for Discharge Goal: Pt. Will Perform Toileting-Clothing Manipulation Outcome: Adequate for Discharge

## 2020-06-12 LAB — AEROBIC/ANAEROBIC CULTURE W GRAM STAIN (SURGICAL/DEEP WOUND)

## 2020-06-13 ENCOUNTER — Telehealth: Payer: Self-pay | Admitting: Orthopedic Surgery

## 2020-06-13 NOTE — Telephone Encounter (Signed)
Katie(RN) from [facility] called requesting a call back right away. She state's important. Please call her back at [phone].

## 2020-06-13 NOTE — Telephone Encounter (Signed)
Called and sew HHN and this pt is s/p ray amputation with wound vac malfunction since Friday per pt report. Drainage coming from the dressing leaking onto floor per HHN. Advised to remove the vac apply 4x4, abd and ace bandage pt can leave this intact and has appt sch for tomorrow morning with PA

## 2020-06-14 ENCOUNTER — Encounter: Payer: Self-pay | Admitting: Physician Assistant

## 2020-06-14 ENCOUNTER — Telehealth: Payer: Self-pay | Admitting: Physician Assistant

## 2020-06-14 ENCOUNTER — Ambulatory Visit (INDEPENDENT_AMBULATORY_CARE_PROVIDER_SITE_OTHER): Payer: 59 | Admitting: Physician Assistant

## 2020-06-14 ENCOUNTER — Other Ambulatory Visit: Payer: Self-pay | Admitting: Family Medicine

## 2020-06-14 DIAGNOSIS — M869 Osteomyelitis, unspecified: Secondary | ICD-10-CM

## 2020-06-14 MED ORDER — INSULIN LISPRO 200 UNIT/ML ~~LOC~~ SOPN: 10.0000 [IU] | PEN_INJECTOR | Freq: Three times a day (TID) | SUBCUTANEOUS | 3 refills | Status: DC

## 2020-06-14 NOTE — Progress Notes (Signed)
Office Visit Note   Patient: Patrick [name]           Date of Birth: Jun 13, 1965           MRN: [id number] Visit Date: 06/14/2020              Requested by: [facility] [address] [place],  [place] [address] PCP: [place]  Chief Complaint  Patient presents with  . 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  He comes in today full weightbearing on his foot with a walker.  His wound VAC stopped working over the weekend  Assessment & Plan: Visit Diagnoses: No diagnosis found.  Plan: Discussed with  This with the patient that he been nonweightbearing and elevating his foot.  He is at a high risk for further amputation and I explained this to him.  He is continuing to take his Augmentin.  He says he cannot do his own dressing change he will follow-up in 2 days for dressing change here.  Follow-Up Instructions: No follow-ups on file.   Ortho Exam  Patient is alert, oriented, no adenopathy, well-dressed, normal affect, normal respiratory effort. Examination demonstrates wound VAC is removed with skin maceration.  He has a dehiscence of the wound down to the muscle layer.  There is no exposed bone.  He has moderate plus soft tissue swelling but no cellulitis.  Minimal drainage no foul odor noted  Imaging: No results found. No images are attached to the encounter.  Labs: Lab Results  Component Value Date   HGBA1C >15.5 (H) 06/06/2020   HGBA1C >15.5 (H) 06/04/2020   HGBA1C 12.4 (H) 05/03/2019   ESRSEDRATE 105 (H) 06/04/2020   CRP 24.4 (H) 06/04/2020   CRP 1.2 (H) 05/07/2019   CRP 1.8 (H) 05/06/2019   LABURIC 9.1 (H) 06/09/2020   REPTSTATUS 06/12/2020 FINAL 06/06/2020   GRAMSTAIN  06/06/2020    RARE WBC PRESENT, PREDOMINANTLY PMN ABUNDANT GRAM POSITIVE COCCI RARE GRAM POSITIVE RODS    CULT  06/06/2020    ABUNDANT ENTEROCOCCUS  FAECALIS FEW STAPHYLOCOCCUS AURICULARIS NO ANAEROBES ISOLATED Performed at [facility], [place] [address] [place], [place] [address]    LABORGA ENTEROCOCCUS FAECALIS 06/06/2020   LABORGA STAPHYLOCOCCUS AURICULARIS 06/06/2020     Lab Results  Component Value Date   ALBUMIN 2.1 (L) 06/07/2020   ALBUMIN 2.3 (L) 06/06/2020   ALBUMIN 2.3 (L) 06/05/2020   PREALBUMIN 5.1 (L) 06/04/2020    Lab Results  Component Value Date   MG 1.3 (L) 06/06/2020   MG 1.7 05/03/2019   No results found for: [facility]  Lab Results  Component Value Date   PREALBUMIN 5.1 (L) 06/04/2020   CBC EXTENDED Latest Ref Rng & Units 06/10/2020 06/09/2020 06/08/2020  WBC 4.0 - 10.5 K/uL 17.8(H) 19.8(H) 19.8(H)  RBC 4.22 - 5.81 MIL/uL 3.08(L) 3.06(L) 3.27(L)  HGB 13.0 - 17.0 g/dL 8.2(L) 8.2(L) 8.6(L)  HCT 39.0 - 52.0 % 26.5(L) 26.5(L) 27.5(L)  PLT 150 - 400 K/uL 533(H) 478(H) 470(H)  NEUTROABS 1.7 - 7.7 K/uL 14.1(H) 17.1(H) 15.8(H)  LYMPHSABS 0.7 - 4.0 K/uL 1.9 1.1 2.1     There is no height or weight on file to calculate BMI.  Orders:  No orders of the defined types were placed in this encounter.  No orders of the defined types were  placed in this encounter.    Procedures: No procedures performed  Clinical Data: No additional findings.  ROS:  All other systems negative, except as noted in the HPI. Review of Systems  Objective: Vital Signs: There were no vitals taken for this visit.  Specialty Comments:  No specialty comments available.  PMFS History: Patient Active Problem List   Diagnosis Date Noted  . Cutaneous abscess of right foot   . Osteomyelitis of fifth toe of right foot ([place])   . Severe protein-calorie malnutrition ([place])   . Sepsis due to cellulitis ([place]) 06/04/2020  . Diabetic foot ulcer ([place]) 06/04/2020  . Diabetic foot infection ([place]) 06/04/2020  . 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[place]) 06/04/2020  . Acute respiratory disease due to COVID-19 virus 05/03/2019  . Renal insufficiency  05/03/2019  . GOUT 01/27/2010  . OBESITY 01/27/2010  . Insulin-requiring or dependent type II diabetes mellitus ([place]) 02/20/2008  . HYPERTENSION, BENIGN ESSENTIAL 02/20/2008   Past Medical History:  Diagnosis Date  . Diabetes mellitus   . Gout   . History of COVID-19 04/2019  . Hypertension     Family History  Problem Relation Age of Onset  . Diabetes Neg Hx     Past Surgical History:  Procedure Laterality Date  . AMPUTATION Right 06/06/2020   Procedure: AMPUTATION 5TH RAY;  Surgeon: [name], MD;  Location: [facility];  Service: Orthopedics;  Laterality: Right;  . AMPUTATION Right 06/08/2020   Procedure: RIGHT 4TH RAY AMPUTATION;  Surgeon: [name], MD;  Location: [facility];  Service: Orthopedics;  Laterality: Right;   Social History   Occupational History  . Not on file  Tobacco Use  . Smoking status: Never Smoker  . Smokeless tobacco: Never Used  Substance and Sexual Activity  . Alcohol use: No  . Drug use: No  . Sexual activity: Not on file

## 2020-06-14 NOTE — Progress Notes (Signed)
Received prior authorization request for Novolog.  Discussed with pharmacy, the therapeutically equivalent Humalog Dia Crawford is covered.  WIll change to therapeutic equivalent.  Pharmacy will contact patient. Novolog canceled by pharmacy.

## 2020-06-14 NOTE — Telephone Encounter (Signed)
I spoke with the patient's home health care agency today.  They are willing to do dressing changes twice a week.  They did tell me that he has been noncompliant and full weightbearing on his foot.  He has also been driving.  He does have family and friends that are willing to help him change his dressing when they are not there but the patient has declined their help. They will continue to be available for 3 more weeks to do dressing changes twice weekly

## 2020-06-16 ENCOUNTER — Encounter: Payer: Self-pay | Admitting: Orthopedic Surgery

## 2020-06-16 ENCOUNTER — Ambulatory Visit (INDEPENDENT_AMBULATORY_CARE_PROVIDER_SITE_OTHER): Payer: 59 | Admitting: Physician Assistant

## 2020-06-16 DIAGNOSIS — M869 Osteomyelitis, unspecified: Secondary | ICD-10-CM

## 2020-06-16 MED ORDER — SULFAMETHOXAZOLE-TRIMETHOPRIM 800-160 MG PO TABS: 1.0000 | ORAL_TABLET | Freq: Two times a day (BID) | ORAL | 0 refills | Status: DC

## 2020-06-16 NOTE — Progress Notes (Signed)
Office Visit Note   Patient: Patrick [name]           Date of Birth: 05-16-1965           MRN: [id number] Visit Date: 06/16/2020              [facility]ed by: [facility] [address] [place],  [place] [address] PCP: [place]  Chief Complaint  Patient presents with  . Right Foot - Follow-up      HPI: Patient is 8 days status post right fourth ray amputation.  He comes in today for dressing change  Assessment & Plan: Visit Diagnoses: No diagnosis found.  Plan: Prescription was provided for heavy-duty crutches.  He will add Bactrim which has been called into his pharmacy he knows to get at [facility] as well.  Importance stressed of remaining nonweightbearing including driving a car and elevating his foot is much as possible.  He had is at high risk for more surgery.  He will follow-up early next week.  He is going to try and cleanse the wound and change the dressing himself.  He does have home health care nurses who will help with his 2 times a week Dr. Sharol Given also discussed with him increasing his protein intake  Follow-Up Instructions: No follow-ups on file.   Ortho Exam  Patient is alert, oriented, no adenopathy, well-dressed, normal affect, normal respiratory effort. He has dehiscence down to muscle in the central portion of the wound.  Skin maceration and some necrosis surgical sutures are in place distally the wound is more approximated no ascending cellulitis no foul odor  Imaging: No results found. No images are attached to the encounter.  Labs: Lab Results  Component Value Date   HGBA1C >15.5 (H) 06/06/2020   HGBA1C >15.5 (H) 06/04/2020   HGBA1C 12.4 (H) 05/03/2019   ESRSEDRATE 105 (H) 06/04/2020   CRP 24.4 (H) 06/04/2020   CRP 1.2 (H) 05/07/2019   CRP 1.8 (H) 05/06/2019   LABURIC 9.1 (H) 06/09/2020   REPTSTATUS 06/12/2020 FINAL 06/06/2020   GRAMSTAIN  06/06/2020    RARE WBC PRESENT, PREDOMINANTLY PMN ABUNDANT GRAM POSITIVE COCCI RARE  GRAM POSITIVE RODS    CULT  06/06/2020    ABUNDANT ENTEROCOCCUS FAECALIS FEW STAPHYLOCOCCUS AURICULARIS NO ANAEROBES ISOLATED Performed at [facility], [place] [address], [place], [place] [address]    LABORGA ENTEROCOCCUS FAECALIS 06/06/2020   LABORGA STAPHYLOCOCCUS AURICULARIS 06/06/2020     Lab Results  Component Value Date   ALBUMIN 2.1 (L) 06/07/2020   ALBUMIN 2.3 (L) 06/06/2020   ALBUMIN 2.3 (L) 06/05/2020   PREALBUMIN 5.1 (L) 06/04/2020    Lab Results  Component Value Date   MG 1.3 (L) 06/06/2020   MG 1.7 05/03/2019   No results found for: [facility]  Lab Results  Component Value Date   PREALBUMIN 5.1 (L) 06/04/2020   CBC EXTENDED Latest Ref Rng & Units 06/10/2020 06/09/2020 06/08/2020  WBC 4.0 - 10.5 K/uL 17.8(H) 19.8(H) 19.8(H)  RBC 4.22 - 5.81 MIL/uL 3.08(L) 3.06(L) 3.27(L)  HGB 13.0 - 17.0 g/dL 8.2(L) 8.2(L) 8.6(L)  HCT 39.0 - 52.0 % 26.5(L) 26.5(L) 27.5(L)  PLT 150 - 400 K/uL 533(H) 478(H) 470(H)  NEUTROABS 1.7 - 7.7 K/uL 14.1(H) 17.1(H) 15.8(H)  LYMPHSABS 0.7 - 4.0 K/uL 1.9 1.1 2.1     There is no height or weight on file to calculate BMI.  Orders:  No orders of the defined types were placed in this encounter.  Meds ordered  this encounter  Medications  . sulfamethoxazole-trimethoprim (BACTRIM DS) 800-160 MG tablet    Sig: Take 1 tablet by mouth 2 (two) times daily.    Dispense:  60 tablet    Refill:  0     Procedures: No procedures performed  Clinical Data: No additional findings.  ROS:  All other systems negative, except as noted in the HPI. Review of Systems  Objective: Vital Signs: There were no vitals taken for this visit.  Specialty Comments:  No specialty comments available.  PMFS History: Patient Active Problem List   Diagnosis Date Noted  . Cutaneous abscess of right foot   . Osteomyelitis of fifth toe of right foot ([place])   . Severe protein-calorie malnutrition ([place])   . Sepsis due to cellulitis ([place]) 06/04/2020  .  Diabetic foot ulcer ([place]) 06/04/2020  . Diabetic foot infection ([place]) 06/04/2020  . 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[place]) 06/04/2020  . Acute respiratory disease due to COVID-19 virus 05/03/2019  . Renal insufficiency 05/03/2019  . GOUT 01/27/2010  . OBESITY 01/27/2010  . Insulin-requiring or dependent type II diabetes mellitus ([place]) 02/20/2008  . HYPERTENSION, BENIGN ESSENTIAL 02/20/2008   Past Medical History:  Diagnosis Date  . Diabetes mellitus   . Gout   . History of COVID-19 04/2019  . Hypertension     Family History  Problem Relation Age of Onset  . Diabetes Neg Hx     Past Surgical History:  Procedure Laterality Date  . AMPUTATION Right 06/06/2020   Procedure: AMPUTATION 5TH RAY;  Surgeon: [name], MD;  Location: [facility];  Service: Orthopedics;  Laterality: Right;  . AMPUTATION Right 06/08/2020   Procedure: RIGHT 4TH RAY AMPUTATION;  Surgeon: [name], MD;  Location: [facility];  Service: Orthopedics;  Laterality: Right;   Social History   Occupational History  . Not on file  Tobacco Use  . Smoking status: Never Smoker  . Smokeless tobacco: Never Used  Substance and Sexual Activity  . Alcohol use: No  . Drug use: No  . Sexual activity: Not on file

## 2020-06-21 ENCOUNTER — Ambulatory Visit: Payer: 59 | Admitting: Physician Assistant

## 2020-06-21 ENCOUNTER — Telehealth: Payer: Self-pay | Admitting: Orthopedic Surgery

## 2020-06-21 NOTE — Telephone Encounter (Signed)
Patrick [name] Investment banker, corporate) from [employer]. Pt need orders for wound care on right foot also Patrick [name] need to know to only use dry dressing. Please call Linda at [phone]. Leave message on secure line if unable to answer.

## 2020-06-21 NOTE — Telephone Encounter (Signed)
I called and sw HHN to advise that per last dictation the pt was to wash the limb with soap and water and apply a dry dressing change daily. He was a NS for his office visit today and se we will reach out to him to resch visit for this week. Will call with any other questions.

## 2020-06-24 ENCOUNTER — Ambulatory Visit: Payer: 59 | Admitting: Physician Assistant

## 2020-06-29 ENCOUNTER — Ambulatory Visit (INDEPENDENT_AMBULATORY_CARE_PROVIDER_SITE_OTHER): Payer: 59 | Admitting: Family

## 2020-06-29 ENCOUNTER — Encounter: Payer: Self-pay | Admitting: Physician Assistant

## 2020-06-29 DIAGNOSIS — T8781 Dehiscence of amputation stump: Secondary | ICD-10-CM | POA: Diagnosis not present

## 2020-06-29 NOTE — Progress Notes (Signed)
Office Visit Note   Patient: Patrick [name]           Date of Birth: [birth year]           MRN: [id number] Visit Date: 06/29/2020              Requested by: Pa, [place] [address] [place],  [place] [address] PCP: [place]  No chief complaint on file.     HPI: Patient is status post right fourth ray amputation.  Comes in today for routine evaluation he is full weightbearing with crutches  Assessment & Plan: Visit Diagnoses: No diagnosis found.  Plan: Discussed with the patient concern for dehiscence of his surgical incision.  I stressed the importance of nonweightbearing on the operative foot.    Due to excessive edema and drainage we will place in a Dynaflex compression wrap with silver cell over the incision  Follow-Up Instructions: No follow-ups on file.   Ortho Exam  Patient is alert, oriented, no adenopathy, well-dressed, normal affect, normal respiratory effort. He has dehiscence down to muscle in the central portion of the incision.  Sutures are in place however there is surrounding maceration and necrotic tissue.  There is no ascending cellulitis no purulence   Imaging: No results found. No images are attached to the encounter.  Labs: Lab Results  Component Value Date   HGBA1C >15.5 (H) 06/06/2020   HGBA1C >15.5 (H) 06/04/2020   HGBA1C 12.4 (H) 05/03/2019   ESRSEDRATE 105 (H) 06/04/2020   CRP 24.4 (H) 06/04/2020   CRP 1.2 (H) 05/07/2019   CRP 1.8 (H) 05/06/2019   LABURIC 9.1 (H) 06/09/2020   REPTSTATUS 06/12/2020 FINAL 06/06/2020   GRAMSTAIN  06/06/2020    RARE WBC PRESENT, PREDOMINANTLY PMN ABUNDANT GRAM POSITIVE COCCI RARE GRAM POSITIVE RODS    CULT  06/06/2020    ABUNDANT ENTEROCOCCUS FAECALIS FEW STAPHYLOCOCCUS AURICULARIS NO ANAEROBES ISOLATED Performed at [facility], [place] [address], [place], [place] [address]    LABORGA ENTEROCOCCUS FAECALIS 06/06/2020   LABORGA STAPHYLOCOCCUS AURICULARIS 06/06/2020      Lab Results  Component Value Date   ALBUMIN 2.1 (L) 06/07/2020   ALBUMIN 2.3 (L) 06/06/2020   ALBUMIN 2.3 (L) 06/05/2020   PREALBUMIN 5.1 (L) 06/04/2020    Lab Results  Component Value Date   MG 1.3 (L) 06/06/2020   MG 1.7 05/03/2019   No results found for: [facility]  Lab Results  Component Value Date   PREALBUMIN 5.1 (L) 06/04/2020   CBC EXTENDED Latest Ref Rng & Units 06/10/2020 06/09/2020 06/08/2020  WBC 4.0 - 10.5 K/uL 17.8(H) 19.8(H) 19.8(H)  RBC 4.22 - 5.81 MIL/uL 3.08(L) 3.06(L) 3.27(L)  HGB 13.0 - 17.0 g/dL 8.2(L) 8.2(L) 8.6(L)  HCT 39.0 - 52.0 % 26.5(L) 26.5(L) 27.5(L)  PLT 150 - 400 K/uL 533(H) 478(H) 470(H)  NEUTROABS 1.7 - 7.7 K/uL 14.1(H) 17.1(H) 15.8(H)  LYMPHSABS 0.7 - 4.0 K/uL 1.9 1.1 2.1     There is no height or weight on file to calculate BMI.  Orders:  No orders of the defined types were placed in this encounter.  No orders of the defined types were placed in this encounter.    Procedures: No procedures performed  Clinical Data: No additional findings.  ROS:  All other systems negative, except as noted in the HPI. Review of Systems  Objective: Vital Signs: There were no vitals taken for this visit.  Specialty Comments:  No specialty comments available.  PMFS History: Patient Active Problem  List   Diagnosis Date Noted  . Cutaneous abscess of right foot   . Osteomyelitis of fifth toe of right foot ([place])   . Severe protein-calorie malnutrition ([place])   . Sepsis due to cellulitis ([place]) 06/04/2020  . Diabetic foot ulcer ([place]) 06/04/2020  . Diabetic foot infection ([place]) 06/04/2020  . 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[place]) 06/04/2020  . Acute respiratory disease due to COVID-19 virus 05/03/2019  . Renal insufficiency 05/03/2019  . GOUT 01/27/2010  . OBESITY 01/27/2010  . Insulin-requiring or dependent type II diabetes mellitus ([place]) 02/20/2008  . HYPERTENSION, BENIGN ESSENTIAL 02/20/2008   Past Medical History:  Diagnosis Date  . Diabetes  mellitus   . Gout   . History of COVID-19 04/2019  . Hypertension     Family History  Problem Relation Age of Onset  . Diabetes Neg Hx     Past Surgical History:  Procedure Laterality Date  . AMPUTATION Right 06/06/2020   Procedure: AMPUTATION 5TH RAY;  Surgeon: [name], MD;  Location: [facility];  Service: Orthopedics;  Laterality: Right;  . AMPUTATION Right 06/08/2020   Procedure: RIGHT 4TH RAY AMPUTATION;  Surgeon: [name], MD;  Location: [facility];  Service: Orthopedics;  Laterality: Right;   Social History   Occupational History  . Not on file  Tobacco Use  . Smoking status: Never Smoker  . Smokeless tobacco: Never Used  Substance and Sexual Activity  . Alcohol use: No  . Drug use: No  . Sexual activity: Not on file

## 2020-07-05 ENCOUNTER — Ambulatory Visit: Payer: 59 | Admitting: Family

## 2020-07-08 ENCOUNTER — Ambulatory Visit: Payer: 59 | Admitting: Family

## 2020-07-09 DIAGNOSIS — Z9289 Personal history of other medical treatment: Secondary | ICD-10-CM

## 2020-07-09 HISTORY — DX: Personal history of other medical treatment: Z92.89

## 2020-07-19 ENCOUNTER — Telehealth: Payer: Self-pay

## 2020-07-19 ENCOUNTER — Ambulatory Visit (INDEPENDENT_AMBULATORY_CARE_PROVIDER_SITE_OTHER): Payer: 59 | Admitting: Family

## 2020-07-19 ENCOUNTER — Ambulatory Visit: Payer: 59 | Admitting: Family

## 2020-07-19 DIAGNOSIS — T8781 Dehiscence of amputation stump: Secondary | ICD-10-CM

## 2020-07-19 DIAGNOSIS — M869 Osteomyelitis, unspecified: Secondary | ICD-10-CM

## 2020-07-19 DIAGNOSIS — Z89421 Acquired absence of other right toe(s): Secondary | ICD-10-CM

## 2020-07-19 NOTE — Telephone Encounter (Signed)
Vaughan Basta with [facility] lm on vm to advise that the pt was at [facility] from 5/21 through 5/26 for a BS of 45 and had I&D of his foot while he was there. Needed resumption of care orders and what wound care is needed. To call after visit with updated orders.

## 2020-07-20 ENCOUNTER — Encounter: Payer: Self-pay | Admitting: Family

## 2020-07-20 DIAGNOSIS — Z89421 Acquired absence of other right toe(s): Secondary | ICD-10-CM

## 2020-07-20 DIAGNOSIS — T8781 Dehiscence of amputation stump: Secondary | ICD-10-CM | POA: Insufficient documentation

## 2020-07-20 HISTORY — DX: Acquired absence of other right toe(s): Z89.421

## 2020-07-20 NOTE — Progress Notes (Signed)
Office Visit Note   Patient: Patrick [name]           Date of Birth: 1965-03-05           MRN: [id number] Visit Date: 07/19/2020              Requested by: Pa, [place] [address] [place],  [place] [address] PCP: [place]  No chief complaint on file.     HPI: Mr. Corp comes in today in follow-up.  In the interim since her last visit he did have a hospital stay at at [facility].  He was admitted for sepsis and did proceed to have I&D of his dehisced incision of his right foot.  Pt with recent 4th and 5th ray amputation of right foot with complicated post op course (due to premature weight bearing resulting in wound dehiscence.  OR culture grew E faecalis and staph ABIs on 5/21 did not show any concerning findings for significant PAD. No signs of osteomyelitis on MRI on 5/25. Wound vac removed on 5/25. Total discharge, patient was discontinued off IV antibiotics and transitioned to oral doxycycline and Augmentin with plan to continue for 4 weeks.  Per patient report he is to follow with [place].   Assessment & Plan: Visit Diagnoses: No diagnosis found.  Plan: Pleased with the overall appearance of the wound bed filled with granulation.  Continue with concern for healing such a large dehisced incision.  Patient is full weightbearing.  Today we will place a Pomogran dressing in the wound bed with a Dynaflex compression wrap to his right lower extremity discussed the importance of minimizing his weightbearing for follow-up for reevaluation in about 10 days.  Home health will change the dressing once in the interim  He will continue with his doxycycline and Augmentin.  Follow-Up Instructions: Return in about 1 week (around 07/26/2020).   Ortho Exam  Patient is alert, oriented, no adenopathy, well-dressed, normal affect, normal respiratory effort. On examination of the right lower extremity the patient has massive edema to his right lower extremity  including the foot and ankle.  Along the lateral column he has massive ulceration where there was a dehisced incision following for fifth ray amputation.  The ulcer is filled in with 90% beefy granulation tissue with clean margins.  There is no active drainage at this time.  Some plantar maceration and peeling skin there is no foul odor no ascending cellulitis  Imaging: No results found. No images are attached to the encounter.  Labs: Lab Results  Component Value Date   HGBA1C >15.5 (H) 06/06/2020   HGBA1C >15.5 (H) 06/04/2020   HGBA1C 12.4 (H) 05/03/2019   ESRSEDRATE 105 (H) 06/04/2020   CRP 24.4 (H) 06/04/2020   CRP 1.2 (H) 05/07/2019   CRP 1.8 (H) 05/06/2019   LABURIC 9.1 (H) 06/09/2020   REPTSTATUS 06/12/2020 FINAL 06/06/2020   GRAMSTAIN  06/06/2020    RARE WBC PRESENT, PREDOMINANTLY PMN ABUNDANT GRAM POSITIVE COCCI RARE GRAM POSITIVE RODS    CULT  06/06/2020    ABUNDANT ENTEROCOCCUS FAECALIS FEW STAPHYLOCOCCUS AURICULARIS NO ANAEROBES ISOLATED Performed at [facility], [place] [address] [place], [place] [address]    LABORGA ENTEROCOCCUS FAECALIS 06/06/2020   LABORGA STAPHYLOCOCCUS AURICULARIS 06/06/2020     Lab Results  Component Value Date   ALBUMIN 2.1 (L) 06/07/2020   ALBUMIN 2.3 (L) 06/06/2020   ALBUMIN 2.3 (L) 06/05/2020   PREALBUMIN 5.1 (L) 06/04/2020    Lab Results  Component Value Date   MG 1.3 (L) 06/06/2020   MG 1.7 05/03/2019   No results found for: VD25OH  Lab Results  Component Value Date   PREALBUMIN 5.1 (L) 06/04/2020   CBC EXTENDED Latest Ref Rng & Units 06/10/2020 06/09/2020 06/08/2020  WBC 4.0 - 10.5 K/uL 17.8(H) 19.8(H) 19.8(H)  RBC 4.22 - 5.81 MIL/uL 3.08(L) 3.06(L) 3.27(L)  HGB 13.0 - 17.0 g/dL 8.2(L) 8.2(L) 8.6(L)  HCT 39.0 - 52.0 % 26.5(L) 26.5(L) 27.5(L)  PLT 150 - 400 K/uL 533(H) 478(H) 470(H)  NEUTROABS 1.7 - 7.7 K/uL 14.1(H) 17.1(H) 15.8(H)  LYMPHSABS 0.7 - 4.0 K/uL 1.9 1.1 2.1     There is no height or weight on file  to calculate BMI.  Orders:  No orders of the defined types were placed in this encounter.  No orders of the defined types were placed in this encounter.    Procedures: No procedures performed  Clinical Data: No additional findings.  ROS:  All other systems negative, except as noted in the HPI. Review of Systems  Objective: Vital Signs: There were no vitals taken for this visit.  Specialty Comments:  No specialty comments available.  PMFS History: Patient Active Problem List   Diagnosis Date Noted  . Cutaneous abscess of right foot   . Osteomyelitis of fifth toe of right foot ([place])   . Severe protein-calorie malnutrition ([place])   . Sepsis due to cellulitis ([place]) 06/04/2020  . Diabetic foot ulcer ([place]) 06/04/2020  . Diabetic foot infection ([place]) 06/04/2020  . 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[place]) 06/04/2020  . Acute respiratory disease due to COVID-19 virus 05/03/2019  . Renal insufficiency 05/03/2019  . GOUT 01/27/2010  . OBESITY 01/27/2010  . Insulin-requiring or dependent type II diabetes mellitus ([place]) 02/20/2008  . HYPERTENSION, BENIGN ESSENTIAL 02/20/2008   Past Medical History:  Diagnosis Date  . Diabetes mellitus   . Gout   . History of COVID-19 04/2019  . Hypertension     Family History  Problem Relation Age of Onset  . Diabetes Neg Hx     Past Surgical History:  Procedure Laterality Date  . AMPUTATION Right 06/06/2020   Procedure: AMPUTATION 5TH RAY;  Surgeon: [name], MD;  Location: [place];  Service: Orthopedics;  Laterality: Right;  . AMPUTATION Right 06/08/2020   Procedure: RIGHT 4TH RAY AMPUTATION;  Surgeon: [name], MD;  Location: [place];  Service: Orthopedics;  Laterality: Right;   Social History   Occupational History  . Not on file  Tobacco Use  . Smoking status: Never Smoker  . Smokeless tobacco: Never Used  Substance and Sexual Activity  . Alcohol use: No  . Drug use: No  . Sexual activity: Not on file

## 2020-07-20 NOTE — Telephone Encounter (Signed)
I called and sw Vaughan Basta and advised ok for resumption of care orders. Pt will follo wup in the office 07/26/20 and I will hold this message in my box until that time. We place pt in a profore compression wrap and if pt responds well can have HHn change dressing once a week for 4 weeks. Will call to advise after appt.

## 2020-07-25 ENCOUNTER — Other Ambulatory Visit: Payer: Self-pay | Admitting: Physician Assistant

## 2020-07-25 ENCOUNTER — Ambulatory Visit (INDEPENDENT_AMBULATORY_CARE_PROVIDER_SITE_OTHER): Payer: 59 | Admitting: Orthopedic Surgery

## 2020-07-25 ENCOUNTER — Other Ambulatory Visit: Payer: Self-pay

## 2020-07-25 ENCOUNTER — Encounter: Payer: Self-pay | Admitting: Orthopedic Surgery

## 2020-07-25 DIAGNOSIS — M869 Osteomyelitis, unspecified: Secondary | ICD-10-CM

## 2020-07-25 DIAGNOSIS — Z89421 Acquired absence of other right toe(s): Secondary | ICD-10-CM

## 2020-07-25 DIAGNOSIS — T8781 Dehiscence of amputation stump: Secondary | ICD-10-CM

## 2020-07-25 NOTE — Telephone Encounter (Signed)
Called and lm on vm to advise that the pt is being sch for a BKA on Friday. To call with any questions.

## 2020-07-25 NOTE — Progress Notes (Signed)
Office Visit Note   Patient: Patrick [name]           Date of Birth: 1965/02/20           MRN: [id number] Visit Date: 07/25/2020              Requested by: Pa, [place] [address] [place],  [place] [address] PCP: [place]  Chief Complaint  Patient presents with  . Right Foot - Routine Post Op    07/08/20 I&D right foot [facility]) 06/08/20 right 4th and 5th ray amputation       HPI: Patient is a 55 year old gentleman who was seen in follow-up for his right foot.  Patient has undergone 3 surgeries for foot salvage intervention most recently he had his third surgery at [facility].  He states that the wound was debrided and was not closed.  He states his glucose was 44 at [facility].  Patient is currently on Bactrim DS twice a day.  Assessment & Plan: Visit Diagnoses:  1. Osteomyelitis of fifth toe of right foot ([place])   2. Dehiscence of amputation stump ([place])   3. 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  We will plan for a transtibial amputation this week.  Discussed inpatient versus outpatient versus home health nursing for rehab postoperatively.  Follow-Up Instructions: Return in about 2 weeks (around 08/08/2020).   Ortho Exam  Patient is alert, oriented, no adenopathy, well-dressed, normal affect, normal respiratory effort. Examination patient has progressive dehiscence of the wound there is odor pain swelling and drainage there is necrotic tissue at the base of the wound which measures 6 x 13 cm that probes to bone and the hindfoot.  The wound is 3 cm deep.  He has a good dorsalis pedis pulse.  No arterial insufficiency.  Imaging: No results found. No images are attached to the encounter.  Labs: Lab Results  Component Value Date   HGBA1C >15.5 (H) 06/06/2020   HGBA1C >15.5 (H) 06/04/2020   HGBA1C 12.4  (H) 05/03/2019   ESRSEDRATE 105 (H) 06/04/2020   CRP 24.4 (H) 06/04/2020   CRP 1.2 (H) 05/07/2019   CRP 1.8 (H) 05/06/2019   LABURIC 9.1 (H) 06/09/2020   REPTSTATUS 06/12/2020 FINAL 06/06/2020   GRAMSTAIN  06/06/2020    RARE WBC PRESENT, PREDOMINANTLY PMN ABUNDANT GRAM POSITIVE COCCI RARE GRAM POSITIVE RODS    CULT  06/06/2020    ABUNDANT ENTEROCOCCUS FAECALIS FEW STAPHYLOCOCCUS AURICULARIS NO ANAEROBES ISOLATED Performed at [facility], [place] [address], [place],  [address]    LABORGA ENTEROCOCCUS FAECALIS 06/06/2020   LABORGA STAPHYLOCOCCUS AURICULARIS 06/06/2020     Lab Results  Component Value Date   ALBUMIN 2.1 (L) 06/07/2020   ALBUMIN 2.3 (L) 06/06/2020   ALBUMIN 2.3 (L) 06/05/2020   PREALBUMIN 5.1 (L) 06/04/2020    Lab Results  Component Value Date   MG 1.3 (L) 06/06/2020   MG 1.7 05/03/2019   No results found for: [facility]  Lab Results  Component Value Date   PREALBUMIN 5.1 (L) 06/04/2020   CBC EXTENDED Latest Ref Rng & Units 06/10/2020 06/09/2020 06/08/2020  WBC 4.0 - 10.5 K/uL 17.8(H) 19.8(H) 19.8(H)  RBC 4.22 - 5.81 MIL/uL 3.08(L) 3.06(L) 3.27(L)  HGB 13.0 - 17.0 g/dL 8.2(L) 8.2(L) 8.6(L)  HCT 39.0 - 52.0 % 26.5(L) 26.5(L) 27.5(L)  PLT 150 - 400 K/uL 533(H) 478(H) 470(H)  NEUTROABS 1.7 - 7.7 K/uL 14.1(H) 17.1(H) 15.8(H)  LYMPHSABS 0.7 - 4.0 K/uL 1.9 1.1 2.1     There is no height or weight on file to calculate BMI.  Orders:  Orders Placed This Encounter  Procedures  . Type and screen   No orders of the defined types were placed in this encounter.    Procedures: No procedures performed  Clinical Data: No additional findings.  ROS:  All other systems negative, except as noted in the HPI. Review of Systems  Objective: Vital Signs: There were no vitals taken for this visit.  Specialty Comments:  No specialty comments available.  PMFS History: Patient Active Problem List   Diagnosis Date Noted  . History of complete ray  amputation of fifth toe of right foot ([place]) 07/20/2020  . Dehiscence of amputation stump ([place]) 07/20/2020  . Cutaneous abscess of right foot   . Osteomyelitis of fifth toe of right foot ([place])   . Severe protein-calorie malnutrition ([place])   . Sepsis due to cellulitis ([place]) 06/04/2020  . Diabetic foot ulcer ([place]) 06/04/2020  . Diabetic foot infection ([place]) 06/04/2020  . 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[place]) 06/04/2020  . Acute respiratory disease due to COVID-19 virus 05/03/2019  . Renal insufficiency 05/03/2019  . GOUT 01/27/2010  . OBESITY 01/27/2010  . Insulin-requiring or dependent type II diabetes mellitus ([place]) 02/20/2008  . HYPERTENSION, BENIGN ESSENTIAL 02/20/2008   Past Medical History:  Diagnosis Date  . Diabetes mellitus   . Gout   . History of COVID-19 04/2019  . Hypertension     Family History  Problem Relation Age of Onset  . Diabetes Neg Hx     Past Surgical History:  Procedure Laterality Date  . AMPUTATION Right 06/06/2020   Procedure: AMPUTATION 5TH RAY;  Surgeon: [name], MD;  Location: [place];  Service: Orthopedics;  Laterality: Right;  . AMPUTATION Right 06/08/2020   Procedure: RIGHT 4TH RAY AMPUTATION;  Surgeon: [name], MD;  Location: [place];  Service: Orthopedics;  Laterality: Right;   Social History   Occupational History  . Not on file  Tobacco Use  . Smoking status: Never Smoker  . Smokeless tobacco: Never Used  Substance and Sexual Activity  . Alcohol use: No  . Drug use: No  . Sexual activity: Not on file

## 2020-07-26 ENCOUNTER — Encounter: Payer: 59 | Admitting: Family

## 2020-07-26 ENCOUNTER — Other Ambulatory Visit: Payer: Self-pay

## 2020-07-26 NOTE — Addendum Note (Signed)
Addended by: Georgette Dover on: 07/26/2020 05:47 AM   Modules accepted: Orders

## 2020-07-28 ENCOUNTER — Ambulatory Visit (INDEPENDENT_AMBULATORY_CARE_PROVIDER_SITE_OTHER): Payer: 59 | Admitting: Podiatry

## 2020-07-28 ENCOUNTER — Ambulatory Visit (INDEPENDENT_AMBULATORY_CARE_PROVIDER_SITE_OTHER): Payer: 59

## 2020-07-28 ENCOUNTER — Other Ambulatory Visit: Payer: Self-pay

## 2020-07-28 DIAGNOSIS — M86171 Other acute osteomyelitis, right ankle and foot: Secondary | ICD-10-CM | POA: Diagnosis not present

## 2020-07-28 DIAGNOSIS — Z794 Long term (current) use of insulin: Secondary | ICD-10-CM

## 2020-07-28 DIAGNOSIS — E119 Type 2 diabetes mellitus without complications: Secondary | ICD-10-CM

## 2020-07-28 DIAGNOSIS — T8189XA Other complications of procedures, not elsewhere classified, initial encounter: Secondary | ICD-10-CM

## 2020-07-28 NOTE — Progress Notes (Signed)
Patient states that he is going to get a second opinion before proceeding with surgery with Dr. Sharol Given.  Instructed patient to call Dr. Jess Barters office.  Dr. Sharol Given is aware.

## 2020-07-29 ENCOUNTER — Encounter (HOSPITAL_COMMUNITY): Admission: RE | Payer: Self-pay | Source: Home / Self Care

## 2020-07-29 ENCOUNTER — Inpatient Hospital Stay (HOSPITAL_COMMUNITY): Admission: RE | Admit: 2020-07-29 | Payer: 59 | Source: Home / Self Care | Admitting: Orthopedic Surgery

## 2020-07-29 SURGERY — AMPUTATION BELOW KNEE
Anesthesia: Choice | Site: Knee | Laterality: Right

## 2020-08-03 NOTE — Progress Notes (Signed)
Subjective:   Patient ID: Patrick [name], male   DOB: 55 y.o.   MRN: [id number]   HPI 55 year old male presents the office today for second opinion in regards to his right foot.  He states that she had 3 surgeries on his right foot is not healing.  He is scheduled for below-knee amputation tomorrow but he is canceled this.  He has had previous partial fourth and fifth ray amputations [place] and the wounds are not healing.  He wants to have other options before jumping to have an implant amputation if able.  Currently denies any fevers or chills.  No nausea or vomiting.   Review of Systems  All other systems reviewed and are negative.    Past Medical History:  Diagnosis Date   Diabetes mellitus    Gout    History of COVID-19 04/2019   Hypertension     Past Surgical History:  Procedure Laterality Date   AMPUTATION Right 06/06/2020   Procedure: AMPUTATION 5TH RAY;  Surgeon: [name], MD;  Location: [place];  Service: Orthopedics;  Laterality: Right;   AMPUTATION Right 06/08/2020   Procedure: RIGHT 4TH RAY AMPUTATION;  Surgeon: [name], MD;  Location: [place];  Service: Orthopedics;  Laterality: Right;     Current Outpatient Medications:    allopurinol (ZYLOPRIM) 100 MG tablet, Take 1 tablet (100 mg total) by mouth 2 (two) times daily., Disp: 60 tablet, Rfl: 3   amLODipine (NORVASC) 10 MG tablet, Take 1 tablet (10 mg total) by mouth daily., Disp: 30 tablet, Rfl: 1   amoxicillin-clavulanate (AUGMENTIN) 875-125 MG tablet, Take 1 tablet by mouth 2 (two) times daily., Disp: , Rfl:    benzonatate (TESSALON) 100 MG capsule, Take by mouth., Disp: , Rfl:    blood glucose meter kit and supplies, Dispense based on patient and insurance preference. Use up to four times daily as directed. (FOR ICD-10 E10.9, E11.9)., Disp: 1 each, Rfl: 0   doxycycline (VIBRA-TABS) 100 MG tablet, Take 100 mg by mouth 2 (two) times daily., Disp: , Rfl:    gabapentin (NEURONTIN) 100 MG capsule,  Take 100 mg by mouth every evening., Disp: , Rfl:    gabapentin (NEURONTIN) 300 MG capsule, Take 1 capsule by mouth 3 (three) times daily., Disp: , Rfl:    indomethacin (INDOCIN) 25 MG capsule, Take 50 mg by mouth 3 (three) times daily as needed., Disp: , Rfl:    insulin glargine (LANTUS SOLOSTAR) 100 UNIT/ML Solostar Pen, Inject 28-60 Units into the skin 2 (two) times daily. Inject 60 units in the morning and 28 units at bedtime, Disp: 15 mL, Rfl: 1   insulin lispro (HUMALOG) 200 UNIT/ML KwikPen, Inject 10 Units into the skin 3 (three) times daily before meals., Disp: 6 mL, Rfl: 3   Insulin Pen Needle 32G X 8 MM MISC, Use as directed, Disp: 100 each, Rfl: 1   losartan (COZAAR) 100 MG tablet, Take 1 tablet (100 mg total) by mouth daily., Disp: 30 tablet, Rfl: 0   metFORMIN (GLUCOPHAGE) 500 MG tablet, Take 1 tablet (500 mg total) by mouth 2 (two) times daily with a meal., Disp: 60 tablet, Rfl: 0   metoprolol tartrate (LOPRESSOR) 25 MG tablet, Take 1 tablet (25 mg total) by mouth 2 (two) times daily., Disp: 60 tablet, Rfl: 0   oxyCODONE-acetaminophen (PERCOCET) 5-325 MG tablet, Take 1 tablet by mouth every 4 (four) hours as needed., Disp: 30 tablet, Rfl: 0   pantoprazole (PROTONIX) 40 MG  tablet, Take 1 tablet (40 mg total) by mouth daily., Disp: 30 tablet, Rfl: 0   sulfamethoxazole-trimethoprim (BACTRIM DS) 800-160 MG tablet, Take 1 tablet by mouth 2 (two) times daily., Disp: 60 tablet, Rfl: 0   tiZANidine (ZANAFLEX) 4 MG tablet, Take 4 mg by mouth at bedtime., Disp: , Rfl:    vancomycin (VANCOCIN) 125 MG capsule, Take 125 mg by mouth 4 (four) times daily., Disp: , Rfl:   No Known Allergies       Objective:  Physical Exam  General: AAO x3, NAD  Dermatological: Significant wound present to lateral aspect of the foot on the entire lateral aspect with a granular wound base.  Small and clear drainage expressed there is no purulence.  There is chronic edema present of the foot and leg it appears  that there is no erythema or warmth.  There is no fluctuance or crepitation.  Vascular: Dorsalis Pedis artery and Posterior Tibial artery pedal pulses are 2/4 bilateral with immedate capillary fill time.  There is no pain with calf compression, swelling, warmth, erythema.   Neruologic: Sensation decreased  Musculoskeletal: Fourth and fifth amputations present.  No pain.     Assessment:   Nonhealing amputation site right foot     Plan:  -Treatment options discussed including all alternatives, risks, and complications -Etiology of symptoms were discussed -X-rays were obtained and reviewed with the patient.  Status post fourth and fifth ray amputations.  No definitive evidence of acute osteomyelitis identified today. -We long discussion today in regards to surgical options for his foot.  He is already had 3 surgeries which is not healing.  Discussed with him below amputation versus try to save the foot.  Without to get more information.  I ordered an MRI of the right foot.  Also order blood work including CRP, ESR, CBC, BMP.  He is to monitor closely for any signs or symptoms of worsening infection report directly to the emergency department.  Trula Slade DPM

## 2020-08-04 ENCOUNTER — Other Ambulatory Visit: Payer: 59

## 2020-08-06 ENCOUNTER — Encounter (HOSPITAL_COMMUNITY): Payer: Self-pay

## 2020-08-06 ENCOUNTER — Emergency Department (HOSPITAL_COMMUNITY)
Admission: EM | Admit: 2020-08-06 | Discharge: 2020-08-06 | Disposition: A | Discharge status: Home / Self Care | Payer: 59 | Attending: Emergency Medicine | Admitting: Emergency Medicine

## 2020-08-06 ENCOUNTER — Other Ambulatory Visit: Payer: Self-pay

## 2020-08-06 DIAGNOSIS — Z4800 Encounter for change or removal of nonsurgical wound dressing: Secondary | ICD-10-CM | POA: Insufficient documentation

## 2020-08-06 DIAGNOSIS — Z79899 Other long term (current) drug therapy: Secondary | ICD-10-CM | POA: Insufficient documentation

## 2020-08-06 DIAGNOSIS — Z8616 Personal history of COVID-19: Secondary | ICD-10-CM | POA: Insufficient documentation

## 2020-08-06 DIAGNOSIS — Z7984 Long term (current) use of oral hypoglycemic drugs: Secondary | ICD-10-CM | POA: Insufficient documentation

## 2020-08-06 DIAGNOSIS — Z794 Long term (current) use of insulin: Secondary | ICD-10-CM | POA: Insufficient documentation

## 2020-08-06 DIAGNOSIS — I10 Essential (primary) hypertension: Secondary | ICD-10-CM | POA: Diagnosis not present

## 2020-08-06 DIAGNOSIS — Z5189 Encounter for other specified aftercare: Secondary | ICD-10-CM

## 2020-08-06 DIAGNOSIS — E119 Type 2 diabetes mellitus without complications: Secondary | ICD-10-CM | POA: Insufficient documentation

## 2020-08-06 NOTE — Discharge Instructions (Addendum)
Continue wound care with your respective specialists and continue your prescribed antibiotics.  Return for new or concerning symptoms.

## 2020-08-06 NOTE — ED Notes (Signed)
Patient has diabetic wound to the right foot. Has had multiple surgeries and has home health nurse that changes dressings. Patient reports his foot got wet yesterday, thought his home health nurse would not be available today, and came to ED instead. Patient states he has an appointment to start going to wound care clinic this week. Just wants to have his foot re-wrapped.  On assessment, no drainage or foul odor noted.

## 2020-08-06 NOTE — ED Triage Notes (Signed)
Patient here to have right foot re-wrapped after it got wet in the rain. Patient states that his home health RN unavailable to come to home to change. Has had multiple surgeries on same

## 2020-08-06 NOTE — ED Provider Notes (Signed)
[facility] EMERGENCY DEPARTMENT Provider Note   CSN: [id number] Arrival date & time: 08/06/20  1725     History No chief complaint on file.   Patrick [name] is a 55 y.o. male.  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  He accidentally got his dressing wet and will not have someone to come change his dressing at the home until Monday.  He has been compliant with his current antibiotics.  Denies increasing redness as well as associated fevers, chills, nausea, vomiting, increased drainage from the wound, increased pain.  The history is provided by the patient. No language interpreter was used.      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[place]) 06/04/2020   Acute respiratory disease due to COVID-19 virus 05/03/2019   Renal insufficiency 05/03/2019   GOUT 01/27/2010   OBESITY 01/27/2010   Insulin-requiring or dependent type II diabetes mellitus ([place]) 02/20/2008   HYPERTENSION, BENIGN ESSENTIAL 02/20/2008    Past Surgical History:  Procedure Laterality Date   AMPUTATION Right 06/06/2020   Procedure: AMPUTATION 5TH RAY;  Surgeon: [name], MD;  Location: [facility];  Service: Orthopedics;  Laterality: Right;   AMPUTATION Right 06/08/2020    Procedure: RIGHT 4TH RAY AMPUTATION;  Surgeon: [name], MD;  Location: [facility];  Service: Orthopedics;  Laterality: Right;       Family History  Problem Relation Age of Onset   Diabetes Neg Hx     Social History   Tobacco Use   Smoking status: Never   Smokeless tobacco: Never  Substance Use Topics   Alcohol use: No   Drug use: No    Home Medications Prior to Admission medications   Medication Sig Start Date End Date Taking? Authorizing Provider  allopurinol (ZYLOPRIM) 100 MG tablet Take 1 tablet (100 mg total) by mouth 2 (two) times daily. 06/10/20   Persons, Bevely Palmer, PA  amLODipine (NORVASC) 10 MG tablet Take 1 tablet (10 mg total) by mouth daily. 06/10/20   Darliss Cheney, MD  amoxicillin-clavulanate (AUGMENTIN) 875-125 MG tablet Take 1 tablet by mouth 2 (two) times daily. 07/14/20   [provider]  benzonatate (TESSALON) 100 MG capsule Take by mouth. 07/14/20   [provider]  blood glucose meter kit and supplies Dispense based on patient and insurance preference. Use up to four times daily as directed. (FOR ICD-10 E10.9, E11.9). 05/07/19   Ghimire, Henreitta Leber, MD  doxycycline (VIBRA-TABS) 100 MG tablet Take 100 mg by mouth 2 (two) times daily. 07/14/20   [provider]  gabapentin (NEURONTIN) 100 MG capsule Take 100 mg by mouth every evening.    [provider]  gabapentin (NEURONTIN) 300  MG capsule Take 1 capsule by mouth 3 (three) times daily. 03/17/20   [provider]  indomethacin (INDOCIN) 25 MG capsule Take 50 mg by mouth 3 (three) times daily as needed. 05/20/20   [provider]  insulin glargine (LANTUS SOLOSTAR) 100 UNIT/ML Solostar Pen Inject 28-60 Units into the skin 2 (two) times daily. Inject 60 units in the morning and 28 units at bedtime 06/10/20   Darliss Cheney, MD  insulin lispro (HUMALOG) 200 UNIT/ML KwikPen Inject 10 Units into the skin 3 (three) times daily before meals. 06/14/20   Danford, Suann Larry, MD   Insulin Pen Needle 32G X 8 MM MISC Use as directed 06/10/20   Darliss Cheney, MD  losartan (COZAAR) 100 MG tablet Take 1 tablet (100 mg total) by mouth daily. 06/10/20 07/10/20  Darliss Cheney, MD  metFORMIN (GLUCOPHAGE) 500 MG tablet Take 1 tablet (500 mg total) by mouth 2 (two) times daily with a meal. 05/07/19   Ghimire, Henreitta Leber, MD  metoprolol tartrate (LOPRESSOR) 25 MG tablet Take 1 tablet (25 mg total) by mouth 2 (two) times daily. 06/10/20 07/10/20  Darliss Cheney, MD  oxyCODONE-acetaminophen (PERCOCET) 5-325 MG tablet Take 1 tablet by mouth every 4 (four) hours as needed. 06/10/20 06/10/21  Persons, Bevely Palmer, PA  pantoprazole (PROTONIX) 40 MG tablet Take 1 tablet (40 mg total) by mouth daily. 06/11/20 07/11/20  Darliss Cheney, MD  sulfamethoxazole-trimethoprim (BACTRIM DS) 800-160 MG tablet Take 1 tablet by mouth 2 (two) times daily. 06/16/20   Persons, Bevely Palmer, PA  tiZANidine (ZANAFLEX) 4 MG tablet Take 4 mg by mouth at bedtime. 04/17/19   [provider]  vancomycin (VANCOCIN) 125 MG capsule Take 125 mg by mouth 4 (four) times daily. 07/14/20   [provider]    Allergies    Patient has no known allergies.  Review of Systems   Review of Systems Ten systems reviewed and are negative for acute change, except as noted in the HPI.    Physical Exam Updated Vital Signs BP 111/81   Pulse 100   Temp 98.4 F (36.9 C)   Resp 20   SpO2 100%   Physical Exam Vitals and nursing note reviewed.  Constitutional:      General: He is not in acute distress.    Appearance: He is well-developed. He is not diaphoretic.     Comments: Pleasant, obese AA male.  HENT:     Head: Normocephalic and atraumatic.  Eyes:     General: No scleral icterus.    Conjunctiva/sclera: Conjunctivae normal.  Pulmonary:     Effort: Pulmonary effort is normal. No respiratory distress.     Comments: Respirations even and unlabored Musculoskeletal:        General: Normal range of motion.     Cervical  back: Normal range of motion.       Feet:  Skin:    General: Skin is warm and dry.     Coloration: Skin is not pale.     Findings: No erythema or rash.  Neurological:     Mental Status: He is alert and oriented to person, place, and time.     Coordination: Coordination normal.  Psychiatric:        Behavior: Behavior normal.    ED Results / Procedures / Treatments   Labs (all labs ordered are listed, but only abnormal results are displayed) Labs Reviewed - No data to display  EKG None  Radiology No results found.  Procedures Wound packing  Date/Time: 08/06/2020 11:32 PM Performed by: Antonietta Breach, PA-C Authorized by: Antonietta Breach, PA-C  Consent: The procedure was performed in an emergent situation. Verbal consent obtained. Risks and benefits: risks, benefits and alternatives were discussed Consent given by: patient Patient consent: the patient's understanding of the procedure matches consent given Procedure consent: procedure consent matches procedure scheduled Relevant documents: relevant documents present and verified Test results: test results available and properly labeled Imaging studies: imaging studies available Required items: required blood products, implants, devices, and special equipment available Patient identity confirmed: verbally with patient and arm band Time out: Immediately prior to procedure a "time out" was called to verify the correct patient, procedure, equipment, support staff and site/side marked as required. Patient tolerance: patient tolerated the procedure well with no immediate complications Comments: Open wound dressed with non-adherent gauze, 4x4 gauze, wrapped with Kerlex and ACE wrap.     Medications Ordered in ED Medications - No data to display  ED Course  I have reviewed the triage vital signs and the nursing notes.  Pertinent labs & imaging results that were available during my care of the patient were reviewed by me and  considered in my medical decision making (see chart for details).    MDM Rules/Calculators/A&P                          55 year old male presents to the emergency department for dressing change to his chronic right foot wound.  Does not appear to be acutely infected.  Instructed patient to continue his prescribed antibiotics.  He has follow-up with his specialists on Monday.  Also due for repeat wound care with home health in 2 days.  Return precautions discussed and provided. Patient discharged in stable condition with no unaddressed concerns.   Final Clinical Impression(s) / ED Diagnoses Final diagnoses:  Encounter for wound care    Rx / DC Orders ED Discharge Orders     None        Antonietta Breach, PA-C 08/06/20 2334    Valarie Merino, MD 08/09/20 [phone]

## 2020-08-06 NOTE — ED Provider Notes (Signed)
Emergency Medicine Provider Triage Evaluation Note  Patrick [name] , a 55 y.o. male  was evaluated in triage.  Pt complains of to have his right foot rewrapped, as he started to fall yesterday and his dressings got wet.  He has history of multiple recent surgery left foot due to diabetic ulcer and subsequent infection.  States he is not having fevers or chills nausea, vomiting, or increased drainage from the wound, no increased pain either, but states his home health aide and his primary care doctor were unavailable to redress his wound today..  Review of Systems  Positive: Wet dressings to the right foot Negative: Worsening pain, purulent drainage, nausea, vomiting, fevers, chills, shortness of breath.  Physical Exam  BP 138/82 (BP Location: Left Arm)   Pulse (!) 107   Temp 98.4 F (36.9 C)   Resp 18   SpO2 98%  Gen:   Awake, no distress   Resp:  Normal effort  MSK:   Moves extremities without difficulty  Other:  Wet, soggy dressing to the right foot with foul order, chronic lower extremity edema bilaterally.  Medical Decision Making  Medically screening exam initiated at 6:16 PM.  Appropriate orders placed.  Jolinda Croak was informed that the remainder of the evaluation will be completed by another provider, this initial triage assessment does not replace that evaluation, and the importance of remaining in the ED until their evaluation is complete.  Patient will require extensive wound dressing, unable to perform the service in triage.  No laboratory studies warranted as patient not having any infectious symptoms, simply needs clean dressings on his wounds and further evaluation of them.  This chart was dictated using voice recognition software, Dragon. Despite the best efforts of this provider to proofread and correct errors, errors may still occur which can change documentation meaning.     Aura Dials 08/06/20 1817    Arnaldo Natal, MD 08/06/20 2128

## 2020-08-06 NOTE — ED Notes (Signed)
Patient verbalizes understanding of discharge instructions. Opportunity for questioning and answers were provided. Armband removed by staff, pt discharged from ED via wheelchair.  

## 2020-08-08 ENCOUNTER — Ambulatory Visit: Payer: 59 | Admitting: Dietician

## 2020-08-08 ENCOUNTER — Ambulatory Visit: Payer: 59 | Admitting: Podiatry

## 2020-08-10 ENCOUNTER — Other Ambulatory Visit: Payer: 59

## 2020-08-26 ENCOUNTER — Emergency Department (HOSPITAL_COMMUNITY): Payer: 59

## 2020-08-26 ENCOUNTER — Encounter (HOSPITAL_COMMUNITY): Payer: Self-pay | Admitting: Urgent Care

## 2020-08-26 ENCOUNTER — Inpatient Hospital Stay (HOSPITAL_COMMUNITY)
Admission: EM | Admit: 2020-08-26 | Discharge: 2020-09-01 | DRG: 853 | Disposition: A | Discharge status: Rehabilitation Facility | Payer: 59 | Attending: Internal Medicine | Admitting: Internal Medicine

## 2020-08-26 ENCOUNTER — Ambulatory Visit (HOSPITAL_COMMUNITY)
Admission: EM | Admit: 2020-08-26 | Discharge: 2020-08-26 | Disposition: A | Discharge status: Home / Self Care | Payer: 59

## 2020-08-26 ENCOUNTER — Other Ambulatory Visit: Payer: Self-pay

## 2020-08-26 DIAGNOSIS — R195 Other fecal abnormalities: Secondary | ICD-10-CM | POA: Diagnosis present

## 2020-08-26 DIAGNOSIS — G546 Phantom limb syndrome with pain: Secondary | ICD-10-CM | POA: Diagnosis not present

## 2020-08-26 DIAGNOSIS — B952 Enterococcus as the cause of diseases classified elsewhere: Secondary | ICD-10-CM | POA: Diagnosis present

## 2020-08-26 DIAGNOSIS — Z6838 Body mass index (BMI) 38.0-38.9, adult: Secondary | ICD-10-CM

## 2020-08-26 DIAGNOSIS — A411 Sepsis due to other specified staphylococcus: Secondary | ICD-10-CM | POA: Diagnosis not present

## 2020-08-26 DIAGNOSIS — Z794 Long term (current) use of insulin: Secondary | ICD-10-CM

## 2020-08-26 DIAGNOSIS — E669 Obesity, unspecified: Secondary | ICD-10-CM | POA: Diagnosis present

## 2020-08-26 DIAGNOSIS — K649 Unspecified hemorrhoids: Secondary | ICD-10-CM | POA: Diagnosis present

## 2020-08-26 DIAGNOSIS — E1165 Type 2 diabetes mellitus with hyperglycemia: Secondary | ICD-10-CM | POA: Diagnosis not present

## 2020-08-26 DIAGNOSIS — L089 Local infection of the skin and subcutaneous tissue, unspecified: Secondary | ICD-10-CM | POA: Diagnosis present

## 2020-08-26 DIAGNOSIS — R197 Diarrhea, unspecified: Secondary | ICD-10-CM | POA: Diagnosis present

## 2020-08-26 DIAGNOSIS — I10 Essential (primary) hypertension: Secondary | ICD-10-CM | POA: Diagnosis present

## 2020-08-26 DIAGNOSIS — L0390 Cellulitis, unspecified: Secondary | ICD-10-CM | POA: Diagnosis present

## 2020-08-26 DIAGNOSIS — E11649 Type 2 diabetes mellitus with hypoglycemia without coma: Secondary | ICD-10-CM | POA: Diagnosis not present

## 2020-08-26 DIAGNOSIS — D62 Acute posthemorrhagic anemia: Secondary | ICD-10-CM | POA: Diagnosis present

## 2020-08-26 DIAGNOSIS — E1151 Type 2 diabetes mellitus with diabetic peripheral angiopathy without gangrene: Secondary | ICD-10-CM | POA: Diagnosis present

## 2020-08-26 DIAGNOSIS — A419 Sepsis, unspecified organism: Secondary | ICD-10-CM | POA: Diagnosis present

## 2020-08-26 DIAGNOSIS — R509 Fever, unspecified: Secondary | ICD-10-CM

## 2020-08-26 DIAGNOSIS — E43 Unspecified severe protein-calorie malnutrition: Secondary | ICD-10-CM | POA: Diagnosis present

## 2020-08-26 DIAGNOSIS — L97516 Non-pressure chronic ulcer of other part of right foot with bone involvement without evidence of necrosis: Secondary | ICD-10-CM | POA: Diagnosis present

## 2020-08-26 DIAGNOSIS — L03115 Cellulitis of right lower limb: Secondary | ICD-10-CM | POA: Diagnosis present

## 2020-08-26 DIAGNOSIS — R1030 Lower abdominal pain, unspecified: Secondary | ICD-10-CM | POA: Diagnosis present

## 2020-08-26 DIAGNOSIS — Z79899 Other long term (current) drug therapy: Secondary | ICD-10-CM

## 2020-08-26 DIAGNOSIS — U099 Post covid-19 condition, unspecified: Secondary | ICD-10-CM | POA: Diagnosis present

## 2020-08-26 DIAGNOSIS — N179 Acute kidney failure, unspecified: Secondary | ICD-10-CM | POA: Diagnosis present

## 2020-08-26 DIAGNOSIS — N183 Chronic kidney disease, stage 3 unspecified: Secondary | ICD-10-CM | POA: Diagnosis present

## 2020-08-26 DIAGNOSIS — Z20822 Contact with and (suspected) exposure to covid-19: Secondary | ICD-10-CM | POA: Diagnosis present

## 2020-08-26 DIAGNOSIS — L02611 Cutaneous abscess of right foot: Secondary | ICD-10-CM | POA: Diagnosis present

## 2020-08-26 DIAGNOSIS — I129 Hypertensive chronic kidney disease with stage 1 through stage 4 chronic kidney disease, or unspecified chronic kidney disease: Secondary | ICD-10-CM | POA: Diagnosis present

## 2020-08-26 DIAGNOSIS — Z89431 Acquired absence of right foot: Secondary | ICD-10-CM

## 2020-08-26 DIAGNOSIS — N1832 Chronic kidney disease, stage 3b: Secondary | ICD-10-CM | POA: Diagnosis present

## 2020-08-26 DIAGNOSIS — E11621 Type 2 diabetes mellitus with foot ulcer: Secondary | ICD-10-CM | POA: Diagnosis present

## 2020-08-26 DIAGNOSIS — E11628 Type 2 diabetes mellitus with other skin complications: Secondary | ICD-10-CM | POA: Diagnosis not present

## 2020-08-26 DIAGNOSIS — Z7984 Long term (current) use of oral hypoglycemic drugs: Secondary | ICD-10-CM

## 2020-08-26 DIAGNOSIS — L97509 Non-pressure chronic ulcer of other part of unspecified foot with unspecified severity: Secondary | ICD-10-CM | POA: Diagnosis present

## 2020-08-26 DIAGNOSIS — E1169 Type 2 diabetes mellitus with other specified complication: Secondary | ICD-10-CM | POA: Diagnosis present

## 2020-08-26 DIAGNOSIS — D649 Anemia, unspecified: Secondary | ICD-10-CM | POA: Diagnosis present

## 2020-08-26 DIAGNOSIS — E1122 Type 2 diabetes mellitus with diabetic chronic kidney disease: Secondary | ICD-10-CM | POA: Diagnosis present

## 2020-08-26 DIAGNOSIS — M869 Osteomyelitis, unspecified: Secondary | ICD-10-CM | POA: Diagnosis present

## 2020-08-26 DIAGNOSIS — M109 Gout, unspecified: Secondary | ICD-10-CM | POA: Diagnosis present

## 2020-08-26 DIAGNOSIS — E119 Type 2 diabetes mellitus without complications: Secondary | ICD-10-CM

## 2020-08-26 DIAGNOSIS — R058 Other specified cough: Secondary | ICD-10-CM | POA: Diagnosis present

## 2020-08-26 DIAGNOSIS — E1142 Type 2 diabetes mellitus with diabetic polyneuropathy: Secondary | ICD-10-CM | POA: Diagnosis present

## 2020-08-26 DIAGNOSIS — N390 Urinary tract infection, site not specified: Secondary | ICD-10-CM | POA: Diagnosis present

## 2020-08-26 LAB — URINALYSIS, ROUTINE W REFLEX MICROSCOPIC
Bacteria, UA: NONE SEEN
Bilirubin Urine: NEGATIVE
Glucose, UA: NEGATIVE mg/dL
Ketones, ur: NEGATIVE mg/dL
Leukocytes,Ua: NEGATIVE
Nitrite: NEGATIVE
Protein, ur: 30 mg/dL — AB
Specific Gravity, Urine: 1.017 (ref 1.005–1.030)
pH: 5 (ref 5.0–8.0)

## 2020-08-26 LAB — COMPREHENSIVE METABOLIC PANEL
ALT: 81 U/L — ABNORMAL HIGH (ref 0–44)
AST: 59 U/L — ABNORMAL HIGH (ref 15–41)
Albumin: 2.4 g/dL — ABNORMAL LOW (ref 3.5–5.0)
Alkaline Phosphatase: 184 U/L — ABNORMAL HIGH (ref 38–126)
Anion gap: 10 (ref 5–15)
BUN: 32 mg/dL — ABNORMAL HIGH (ref 6–20)
CO2: 18 mmol/L — ABNORMAL LOW (ref 22–32)
Calcium: 8.7 mg/dL — ABNORMAL LOW (ref 8.9–10.3)
Chloride: 106 mmol/L (ref 98–111)
Creatinine, Ser: 2.27 mg/dL — ABNORMAL HIGH (ref 0.61–1.24)
GFR, Estimated: 34 mL/min — ABNORMAL LOW (ref >60)
Glucose, Bld: 204 mg/dL — ABNORMAL HIGH (ref 70–99)
Potassium: 4.4 mmol/L (ref 3.5–5.1)
Sodium: 134 mmol/L — ABNORMAL LOW (ref 135–145)
Total Bilirubin: 0.7 mg/dL (ref 0.3–1.2)
Total Protein: 7.1 g/dL (ref 6.5–8.1)

## 2020-08-26 LAB — LACTIC ACID, PLASMA: Lactic Acid, Venous: 1.8 mmol/L (ref 0.5–1.9)

## 2020-08-26 MED ORDER — LACTATED RINGERS IV BOLUS (SEPSIS)
1000.0000 mL | Freq: Once | INTRAVENOUS | Status: AC
  Administered 2020-08-27: 1000 mL via INTRAVENOUS

## 2020-08-26 MED ORDER — VANCOMYCIN HCL 10 G IV SOLR
2500.0000 mg | Freq: Once | INTRAVENOUS | Status: AC
  Administered 2020-08-27: 2500 mg via INTRAVENOUS
  Filled 2020-08-26: qty 2500

## 2020-08-26 MED ORDER — SODIUM CHLORIDE 0.9 % IV SOLN
2.0000 g | Freq: Once | INTRAVENOUS | Status: AC
  Administered 2020-08-27: 2 g via INTRAVENOUS
  Filled 2020-08-26: qty 20

## 2020-08-26 MED ORDER — LACTATED RINGERS IV SOLN: INTRAVENOUS | Status: DC

## 2020-08-26 MED ORDER — ACETAMINOPHEN 500 MG PO TABS
1000.0000 mg | ORAL_TABLET | Freq: Once | ORAL | Status: AC
  Administered 2020-08-27: 1000 mg via ORAL
  Filled 2020-08-26: qty 2

## 2020-08-26 NOTE — ED Notes (Signed)
Patient is being discharged from the Urgent Care and sent to the Emergency Department via wheelchair with RN. Per [place] PA, patient is in need of higher level of care due to recurrent sepsis, concern for sepsis in foot that needs amputation. Patient is aware and verbalizes understanding of plan of care. Report called to ED charge RN. Vitals:   08/26/20 1947  BP: (!) 113/51  Pulse: (!) 126  Resp: 18  Temp: 99.8 F (37.7 C)  SpO2: 97%

## 2020-08-26 NOTE — ED Provider Notes (Signed)
Emergency Medicine Provider Triage Evaluation Note  Patrick [name] , a 55 y.o. male  was evaluated in triage.  Pt complains of right foot wound. Patient with complicated wound right foot.  This is been an ongoing issue for him.  Seems that he has developed a fever over the past couple days.  Has foul-smelling wound.   Appears that patient was seen 3 days ago at outside hospital and declined sepsis work-up at that time he was febrile and tachycardic notably.   Review of Systems  Positive: Fever, chills, right foot wound Negative: Vomiting  Physical Exam  BP 130/76 (BP Location: Right Arm)   Pulse (!) 122   Temp 100.1 F (37.8 C) (Oral)   Resp 18   Ht '6\' 4"'$  (1.93 m)   Wt 131.5 kg   SpO2 96%   BMI 35.30 kg/m  Gen:   Awake, no distress  Resp:  Normal effort  MSK:   Moves extremities without difficulty   Other:  Wound to the right foot.  Macerated tissue.  There has been some amputation already conducted.  Wound has abnormal extension.  Purulence present.  Medical Decision Making  Medically screening exam initiated at 9:43 PM.  Appropriate orders placed.  Jolinda Croak was informed that the remainder of the evaluation will be completed by another provider, this initial triage assessment does not replace that evaluation, and the importance of remaining in the ED until their evaluation is complete.  Patient is tachycardic, temperature 100.1 likely pressure is febrile.  Has an obvious wound.  Sepsis order set and code sepsis was called.  He will be moved to room for evaluation in major care.  Will obtain x-ray for likely osteomyelitis    Tedd Sias, [place] 08/26/20 2149    Noemi Chapel, MD 08/26/20 2340

## 2020-08-26 NOTE — ED Provider Notes (Signed)
Patient evaluated at triage. He is tachycardic, borderline febrile. Has had fevers for the past 4 days and is taking Tylenol for it. Has had multiple surgeries and been advised that he should have amputation. Has been going to wound care and different health care facilities just for dressing changes. At triage was found to be tachycardic ranging between 124-129bpm, diaphoretic. There is malodor of his foot with the dressing on. Has had sepsis from the same infection requiring admission. Recommended further evaluation and intervention in the emergency room. Our nursing staff will transport him there now as he is not in acute distress and does not want transport by EMS.    Jaynee Eagles, PA-C 08/26/20 2005

## 2020-08-26 NOTE — ED Triage Notes (Signed)
Pt presents with unhealing wound to right foot. Has had several surgeries following sepsis infection causing amputation of two toes. Presents today with increased drainage, foul smell, chills, and fevers at home. HR 122 in triage.

## 2020-08-26 NOTE — ED Notes (Signed)
Difficulty gaining IV access. 2 RN attempted x2 - unsuccessful. IV team consult placed.

## 2020-08-26 NOTE — ED Triage Notes (Signed)
3 foot surgeries, wound care specialists requires dressing change every other day. Came to have it changed. HR 127, temp 99.8, states he feels okay and that the fever has been on-going over the last four days.

## 2020-08-27 ENCOUNTER — Encounter (HOSPITAL_COMMUNITY)
Admission: EM | Disposition: A | Discharge status: Rehabilitation Facility | Payer: Self-pay | Source: Home / Self Care | Attending: Internal Medicine

## 2020-08-27 ENCOUNTER — Inpatient Hospital Stay (HOSPITAL_COMMUNITY): Payer: 59 | Admitting: Certified Registered Nurse Anesthetist

## 2020-08-27 ENCOUNTER — Encounter (HOSPITAL_COMMUNITY): Payer: Self-pay | Admitting: Internal Medicine

## 2020-08-27 DIAGNOSIS — E1122 Type 2 diabetes mellitus with diabetic chronic kidney disease: Secondary | ICD-10-CM | POA: Diagnosis present

## 2020-08-27 DIAGNOSIS — I739 Peripheral vascular disease, unspecified: Secondary | ICD-10-CM | POA: Diagnosis not present

## 2020-08-27 DIAGNOSIS — E43 Unspecified severe protein-calorie malnutrition: Secondary | ICD-10-CM | POA: Diagnosis present

## 2020-08-27 DIAGNOSIS — Z20822 Contact with and (suspected) exposure to covid-19: Secondary | ICD-10-CM | POA: Diagnosis present

## 2020-08-27 DIAGNOSIS — N1832 Chronic kidney disease, stage 3b: Secondary | ICD-10-CM | POA: Diagnosis present

## 2020-08-27 DIAGNOSIS — L97516 Non-pressure chronic ulcer of other part of right foot with bone involvement without evidence of necrosis: Secondary | ICD-10-CM | POA: Diagnosis present

## 2020-08-27 DIAGNOSIS — A419 Sepsis, unspecified organism: Secondary | ICD-10-CM

## 2020-08-27 DIAGNOSIS — Z794 Long term (current) use of insulin: Secondary | ICD-10-CM

## 2020-08-27 DIAGNOSIS — M10021 Idiopathic gout, right elbow: Secondary | ICD-10-CM | POA: Diagnosis not present

## 2020-08-27 DIAGNOSIS — N1831 Chronic kidney disease, stage 3a: Secondary | ICD-10-CM | POA: Diagnosis not present

## 2020-08-27 DIAGNOSIS — E1165 Type 2 diabetes mellitus with hyperglycemia: Secondary | ICD-10-CM | POA: Diagnosis not present

## 2020-08-27 DIAGNOSIS — Z6838 Body mass index (BMI) 38.0-38.9, adult: Secondary | ICD-10-CM | POA: Diagnosis not present

## 2020-08-27 DIAGNOSIS — L089 Local infection of the skin and subcutaneous tissue, unspecified: Secondary | ICD-10-CM

## 2020-08-27 DIAGNOSIS — E1142 Type 2 diabetes mellitus with diabetic polyneuropathy: Secondary | ICD-10-CM | POA: Diagnosis present

## 2020-08-27 DIAGNOSIS — G546 Phantom limb syndrome with pain: Secondary | ICD-10-CM | POA: Diagnosis not present

## 2020-08-27 DIAGNOSIS — E11628 Type 2 diabetes mellitus with other skin complications: Secondary | ICD-10-CM | POA: Diagnosis present

## 2020-08-27 DIAGNOSIS — A411 Sepsis due to other specified staphylococcus: Secondary | ICD-10-CM | POA: Diagnosis present

## 2020-08-27 DIAGNOSIS — I129 Hypertensive chronic kidney disease with stage 1 through stage 4 chronic kidney disease, or unspecified chronic kidney disease: Secondary | ICD-10-CM | POA: Diagnosis present

## 2020-08-27 DIAGNOSIS — E1169 Type 2 diabetes mellitus with other specified complication: Secondary | ICD-10-CM | POA: Diagnosis present

## 2020-08-27 DIAGNOSIS — E119 Type 2 diabetes mellitus without complications: Secondary | ICD-10-CM | POA: Diagnosis not present

## 2020-08-27 DIAGNOSIS — D649 Anemia, unspecified: Secondary | ICD-10-CM | POA: Diagnosis present

## 2020-08-27 DIAGNOSIS — N390 Urinary tract infection, site not specified: Secondary | ICD-10-CM | POA: Diagnosis present

## 2020-08-27 DIAGNOSIS — E669 Obesity, unspecified: Secondary | ICD-10-CM | POA: Diagnosis present

## 2020-08-27 DIAGNOSIS — L0390 Cellulitis, unspecified: Secondary | ICD-10-CM | POA: Diagnosis not present

## 2020-08-27 DIAGNOSIS — I10 Essential (primary) hypertension: Secondary | ICD-10-CM

## 2020-08-27 DIAGNOSIS — D62 Acute posthemorrhagic anemia: Secondary | ICD-10-CM | POA: Diagnosis present

## 2020-08-27 DIAGNOSIS — Z4781 Encounter for orthopedic aftercare following surgical amputation: Secondary | ICD-10-CM | POA: Diagnosis not present

## 2020-08-27 DIAGNOSIS — M869 Osteomyelitis, unspecified: Secondary | ICD-10-CM | POA: Diagnosis present

## 2020-08-27 DIAGNOSIS — L02611 Cutaneous abscess of right foot: Secondary | ICD-10-CM | POA: Diagnosis present

## 2020-08-27 DIAGNOSIS — E11621 Type 2 diabetes mellitus with foot ulcer: Secondary | ICD-10-CM | POA: Diagnosis present

## 2020-08-27 DIAGNOSIS — N179 Acute kidney failure, unspecified: Secondary | ICD-10-CM | POA: Diagnosis present

## 2020-08-27 DIAGNOSIS — E1151 Type 2 diabetes mellitus with diabetic peripheral angiopathy without gangrene: Secondary | ICD-10-CM | POA: Diagnosis present

## 2020-08-27 DIAGNOSIS — L03115 Cellulitis of right lower limb: Secondary | ICD-10-CM | POA: Diagnosis present

## 2020-08-27 DIAGNOSIS — E11649 Type 2 diabetes mellitus with hypoglycemia without coma: Secondary | ICD-10-CM | POA: Diagnosis not present

## 2020-08-27 DIAGNOSIS — K649 Unspecified hemorrhoids: Secondary | ICD-10-CM | POA: Diagnosis present

## 2020-08-27 DIAGNOSIS — Z89511 Acquired absence of right leg below knee: Secondary | ICD-10-CM | POA: Diagnosis not present

## 2020-08-27 HISTORY — PX: AMPUTATION: SHX166

## 2020-08-27 LAB — CBC
HCT: 27.3 % — ABNORMAL LOW (ref 39.0–52.0)
Hemoglobin: 8.3 g/dL — ABNORMAL LOW (ref 13.0–17.0)
MCH: 25 pg — ABNORMAL LOW (ref 26.0–34.0)
MCHC: 30.4 g/dL (ref 30.0–36.0)
MCV: 82.2 fL (ref 80.0–100.0)
Platelets: 588 10*3/uL — ABNORMAL HIGH (ref 150–400)
RBC: 3.32 MIL/uL — ABNORMAL LOW (ref 4.22–5.81)
RDW: 17.2 % — ABNORMAL HIGH (ref 11.5–15.5)
WBC: 14.4 10*3/uL — ABNORMAL HIGH (ref 4.0–10.5)
nRBC: 0 % (ref 0.0–0.2)

## 2020-08-27 LAB — CBC WITH DIFFERENTIAL/PLATELET
Abs Immature Granulocytes: 0.09 10*3/uL — ABNORMAL HIGH (ref 0.00–0.07)
Basophils Absolute: 0.1 10*3/uL (ref 0.0–0.1)
Basophils Relative: 1 %
Eosinophils Absolute: 0.2 10*3/uL (ref 0.0–0.5)
Eosinophils Relative: 1 %
HCT: 21 % — ABNORMAL LOW (ref 39.0–52.0)
Hemoglobin: 6 g/dL — CL (ref 13.0–17.0)
Immature Granulocytes: 1 %
Lymphocytes Relative: 16 %
Lymphs Abs: 2.2 10*3/uL (ref 0.7–4.0)
MCH: 24 pg — ABNORMAL LOW (ref 26.0–34.0)
MCHC: 28.6 g/dL — ABNORMAL LOW (ref 30.0–36.0)
MCV: 84 fL (ref 80.0–100.0)
Monocytes Absolute: 1.1 10*3/uL — ABNORMAL HIGH (ref 0.1–1.0)
Monocytes Relative: 8 %
Neutro Abs: 10.3 10*3/uL — ABNORMAL HIGH (ref 1.7–7.7)
Neutrophils Relative %: 73 %
Platelets: 572 10*3/uL — ABNORMAL HIGH (ref 150–400)
RBC: 2.5 MIL/uL — ABNORMAL LOW (ref 4.22–5.81)
RDW: 17.6 % — ABNORMAL HIGH (ref 11.5–15.5)
WBC: 14 10*3/uL — ABNORMAL HIGH (ref 4.0–10.5)
nRBC: 0 % (ref 0.0–0.2)

## 2020-08-27 LAB — RESP PANEL BY RT-PCR (FLU A&B, COVID) ARPGX2
Influenza A by PCR: NEGATIVE
Influenza B by PCR: NEGATIVE
SARS Coronavirus 2 by RT PCR: NEGATIVE

## 2020-08-27 LAB — POCT I-STAT 7, (LYTES, BLD GAS, ICA,H+H)
Acid-base deficit: 7 mmol/L — ABNORMAL HIGH (ref 0.0–2.0)
Bicarbonate: 18.4 mmol/L — ABNORMAL LOW (ref 20.0–28.0)
Calcium, Ion: 1.26 mmol/L (ref 1.15–1.40)
HCT: 22 % — ABNORMAL LOW (ref 39.0–52.0)
Hemoglobin: 7.5 g/dL — ABNORMAL LOW (ref 13.0–17.0)
O2 Saturation: 100 %
Patient temperature: 37.2
Potassium: 4.4 mmol/L (ref 3.5–5.1)
Sodium: 138 mmol/L (ref 135–145)
TCO2: 19 mmol/L — ABNORMAL LOW (ref 22–32)
pCO2 arterial: 35.8 mmHg (ref 32.0–48.0)
pH, Arterial: 7.319 — ABNORMAL LOW (ref 7.350–7.450)
pO2, Arterial: 216 mmHg — ABNORMAL HIGH (ref 83.0–108.0)

## 2020-08-27 LAB — BASIC METABOLIC PANEL
Anion gap: 10 (ref 5–15)
BUN: 30 mg/dL — ABNORMAL HIGH (ref 6–20)
CO2: 18 mmol/L — ABNORMAL LOW (ref 22–32)
Calcium: 9 mg/dL (ref 8.9–10.3)
Chloride: 108 mmol/L (ref 98–111)
Creatinine, Ser: 1.96 mg/dL — ABNORMAL HIGH (ref 0.61–1.24)
GFR, Estimated: 40 mL/min — ABNORMAL LOW (ref >60)
Glucose, Bld: 143 mg/dL — ABNORMAL HIGH (ref 70–99)
Potassium: 3.8 mmol/L (ref 3.5–5.1)
Sodium: 136 mmol/L (ref 135–145)

## 2020-08-27 LAB — CBG MONITORING, ED
Glucose-Capillary: 193 mg/dL — ABNORMAL HIGH (ref 70–99)
Glucose-Capillary: 144 mg/dL — ABNORMAL HIGH (ref 70–99)
Glucose-Capillary: 140 mg/dL — ABNORMAL HIGH (ref 70–99)

## 2020-08-27 LAB — C-REACTIVE PROTEIN: CRP: 18.3 mg/dL — ABNORMAL HIGH (ref <1.0)

## 2020-08-27 LAB — HEMOGLOBIN A1C
Hgb A1c MFr Bld: 7.8 % — ABNORMAL HIGH (ref 4.8–5.6)
Mean Plasma Glucose: 177.16 mg/dL

## 2020-08-27 LAB — APTT: aPTT: 31 seconds (ref 24–36)

## 2020-08-27 LAB — SEDIMENTATION RATE: Sed Rate: >140 mm/hr — ABNORMAL HIGH (ref 0–16)

## 2020-08-27 LAB — GLUCOSE, CAPILLARY
Glucose-Capillary: 156 mg/dL — ABNORMAL HIGH (ref 70–99)
Glucose-Capillary: 180 mg/dL — ABNORMAL HIGH (ref 70–99)

## 2020-08-27 LAB — POC OCCULT BLOOD, ED: Fecal Occult Bld: POSITIVE — AB

## 2020-08-27 LAB — LACTIC ACID, PLASMA: Lactic Acid, Venous: 1.3 mmol/L (ref 0.5–1.9)

## 2020-08-27 LAB — PROTIME-INR
INR: 1.3 — ABNORMAL HIGH (ref 0.8–1.2)
Prothrombin Time: 16 seconds — ABNORMAL HIGH (ref 11.4–15.2)

## 2020-08-27 LAB — PREPARE RBC (CROSSMATCH)

## 2020-08-27 SURGERY — AMPUTATION BELOW KNEE
Anesthesia: Regional | Site: Knee | Laterality: Right

## 2020-08-27 MED ORDER — ALLOPURINOL 100 MG PO TABS
100.0000 mg | ORAL_TABLET | Freq: Two times a day (BID) | ORAL | Status: DC
  Administered 2020-08-28 – 2020-09-01 (×10): 100 mg via ORAL
  Filled 2020-08-27 (×10): qty 1

## 2020-08-27 MED ORDER — FENTANYL CITRATE (PF) 250 MCG/5ML IJ SOLN
INTRAMUSCULAR | Status: AC
  Filled 2020-08-27: qty 5

## 2020-08-27 MED ORDER — ONDANSETRON HCL 4 MG/2ML IJ SOLN: 4.0000 mg | Freq: Four times a day (QID) | INTRAMUSCULAR | Status: DC | PRN

## 2020-08-27 MED ORDER — DEXAMETHASONE SODIUM PHOSPHATE 10 MG/ML IJ SOLN
INTRAMUSCULAR | Status: DC | PRN
  Administered 2020-08-27: 10 mg via INTRAVENOUS

## 2020-08-27 MED ORDER — VITAMIN D (ERGOCALCIFEROL) 1.25 MG (50000 UNIT) PO CAPS
50000.0000 [IU] | ORAL_CAPSULE | Freq: Every day | ORAL | Status: AC
  Administered 2020-08-28: 50000 [IU] via ORAL
  Filled 2020-08-27: qty 1

## 2020-08-27 MED ORDER — FERROUS SULFATE 325 (65 FE) MG PO TABS
325.0000 mg | ORAL_TABLET | Freq: Three times a day (TID) | ORAL | Status: DC
  Administered 2020-08-28 – 2020-09-01 (×13): 325 mg via ORAL
  Filled 2020-08-27 (×12): qty 1

## 2020-08-27 MED ORDER — METOPROLOL TARTRATE 5 MG/5ML IV SOLN
2.5000 mg | Freq: Three times a day (TID) | INTRAVENOUS | Status: DC
  Administered 2020-08-27 – 2020-08-28 (×3): 2.5 mg via INTRAVENOUS
  Filled 2020-08-27 (×3): qty 5

## 2020-08-27 MED ORDER — MIDAZOLAM HCL 2 MG/2ML IJ SOLN
INTRAMUSCULAR | Status: DC | PRN
  Administered 2020-08-27: 2 mg via INTRAVENOUS

## 2020-08-27 MED ORDER — HYDROMORPHONE HCL 1 MG/ML IJ SOLN: 0.2500 mg | INTRAMUSCULAR | Status: DC | PRN

## 2020-08-27 MED ORDER — ONDANSETRON HCL 4 MG/2ML IJ SOLN
INTRAMUSCULAR | Status: DC | PRN
  Administered 2020-08-27: 4 mg via INTRAVENOUS

## 2020-08-27 MED ORDER — ACETAMINOPHEN 650 MG RE SUPP: 650.0000 mg | Freq: Four times a day (QID) | RECTAL | Status: DC | PRN

## 2020-08-27 MED ORDER — FENTANYL CITRATE (PF) 250 MCG/5ML IJ SOLN
INTRAMUSCULAR | Status: DC | PRN
  Administered 2020-08-27: 50 ug via INTRAVENOUS
  Administered 2020-08-27: 25 ug via INTRAVENOUS
  Administered 2020-08-27: 75 ug via INTRAVENOUS
  Administered 2020-08-27: 100 ug via INTRAVENOUS

## 2020-08-27 MED ORDER — MIDAZOLAM HCL 2 MG/2ML IJ SOLN
INTRAMUSCULAR | Status: AC
  Filled 2020-08-27: qty 2

## 2020-08-27 MED ORDER — INSULIN ASPART 100 UNIT/ML IJ SOLN
0.0000 [IU] | INTRAMUSCULAR | Status: DC
  Administered 2020-08-27 (×2): 3 [IU] via SUBCUTANEOUS
  Administered 2020-08-27 (×2): 2 [IU] via SUBCUTANEOUS
  Administered 2020-08-28 (×3): 11 [IU] via SUBCUTANEOUS

## 2020-08-27 MED ORDER — PANTOPRAZOLE SODIUM 40 MG PO TBEC
40.0000 mg | DELAYED_RELEASE_TABLET | Freq: Every day | ORAL | Status: DC
  Administered 2020-08-28 – 2020-09-01 (×5): 40 mg via ORAL
  Filled 2020-08-27 (×5): qty 1

## 2020-08-27 MED ORDER — GUAIFENESIN-DM 100-10 MG/5ML PO SYRP: 15.0000 mL | ORAL_SOLUTION | ORAL | Status: DC | PRN

## 2020-08-27 MED ORDER — TRANEXAMIC ACID-NACL 1000-0.7 MG/100ML-% IV SOLN
INTRAVENOUS | Status: AC
  Filled 2020-08-27: qty 100

## 2020-08-27 MED ORDER — PHENOL 1.4 % MT LIQD: 1.0000 | OROMUCOSAL | Status: DC | PRN

## 2020-08-27 MED ORDER — ONDANSETRON HCL 4 MG/2ML IJ SOLN
4.0000 mg | Freq: Four times a day (QID) | INTRAMUSCULAR | Status: DC | PRN
  Filled 2020-08-27: qty 2

## 2020-08-27 MED ORDER — ACETAMINOPHEN 10 MG/ML IV SOLN
INTRAVENOUS | Status: AC
  Filled 2020-08-27: qty 100

## 2020-08-27 MED ORDER — METRONIDAZOLE 500 MG/100ML IV SOLN
500.0000 mg | Freq: Three times a day (TID) | INTRAVENOUS | Status: DC
  Administered 2020-08-27 – 2020-08-28 (×4): 500 mg via INTRAVENOUS
  Filled 2020-08-27 (×6): qty 100

## 2020-08-27 MED ORDER — ALUM & MAG HYDROXIDE-SIMETH 200-200-20 MG/5ML PO SUSP
15.0000 mL | ORAL | Status: DC | PRN
  Administered 2020-08-30: 30 mL via ORAL
  Filled 2020-08-27: qty 30

## 2020-08-27 MED ORDER — ZINC SULFATE 220 (50 ZN) MG PO CAPS
220.0000 mg | ORAL_CAPSULE | Freq: Every day | ORAL | Status: DC
  Administered 2020-08-28 – 2020-09-01 (×5): 220 mg via ORAL
  Filled 2020-08-27 (×5): qty 1

## 2020-08-27 MED ORDER — DOCUSATE SODIUM 100 MG PO CAPS
100.0000 mg | ORAL_CAPSULE | Freq: Every day | ORAL | Status: DC
  Administered 2020-08-28 – 2020-08-30 (×3): 100 mg via ORAL
  Filled 2020-08-27 (×5): qty 1

## 2020-08-27 MED ORDER — ASCORBIC ACID 500 MG PO TABS
1000.0000 mg | ORAL_TABLET | Freq: Every day | ORAL | Status: DC
  Administered 2020-08-28 – 2020-09-01 (×5): 1000 mg via ORAL
  Filled 2020-08-27 (×5): qty 2

## 2020-08-27 MED ORDER — PHENYLEPHRINE HCL-NACL 10-0.9 MG/250ML-% IV SOLN
INTRAVENOUS | Status: DC | PRN
  Administered 2020-08-27: 25 ug/min via INTRAVENOUS

## 2020-08-27 MED ORDER — LACTATED RINGERS IV SOLN: INTRAVENOUS | Status: DC | PRN

## 2020-08-27 MED ORDER — ACETAMINOPHEN 325 MG PO TABS
325.0000 mg | ORAL_TABLET | Freq: Four times a day (QID) | ORAL | Status: DC | PRN
  Administered 2020-08-31: 650 mg via ORAL

## 2020-08-27 MED ORDER — ACETAMINOPHEN 325 MG PO TABS
650.0000 mg | ORAL_TABLET | Freq: Four times a day (QID) | ORAL | Status: DC | PRN
Start: 2020-08-27 — End: 2020-09-01
  Filled 2020-08-27: qty 2

## 2020-08-27 MED ORDER — HYDROMORPHONE HCL 1 MG/ML IJ SOLN: 0.5000 mg | INTRAMUSCULAR | Status: DC | PRN

## 2020-08-27 MED ORDER — ADULT MULTIVITAMIN W/MINERALS CH
1.0000 | ORAL_TABLET | Freq: Every day | ORAL | Status: DC
  Administered 2020-08-28 – 2020-09-01 (×5): 1 via ORAL
  Filled 2020-08-27 (×5): qty 1

## 2020-08-27 MED ORDER — POTASSIUM CHLORIDE CRYS ER 20 MEQ PO TBCR: 20.0000 meq | EXTENDED_RELEASE_TABLET | Freq: Every day | ORAL | Status: DC | PRN

## 2020-08-27 MED ORDER — FLEET ENEMA 7-19 GM/118ML RE ENEM: 1.0000 | ENEMA | Freq: Once | RECTAL | Status: DC | PRN

## 2020-08-27 MED ORDER — ALBUMIN HUMAN 5 % IV SOLN: INTRAVENOUS | Status: DC | PRN

## 2020-08-27 MED ORDER — INSULIN GLARGINE 100 UNIT/ML ~~LOC~~ SOLN
20.0000 [IU] | Freq: Two times a day (BID) | SUBCUTANEOUS | Status: DC
  Administered 2020-08-27 – 2020-08-28 (×3): 20 [IU] via SUBCUTANEOUS
  Filled 2020-08-27 (×5): qty 0.2

## 2020-08-27 MED ORDER — TRANEXAMIC ACID-NACL 1000-0.7 MG/100ML-% IV SOLN
INTRAVENOUS | Status: DC | PRN
  Administered 2020-08-27: 1000 mg via INTRAVENOUS

## 2020-08-27 MED ORDER — SODIUM CHLORIDE 0.9 % IV SOLN: INTRAVENOUS | Status: DC

## 2020-08-27 MED ORDER — METOPROLOL TARTRATE 5 MG/5ML IV SOLN: 2.0000 mg | INTRAVENOUS | Status: DC | PRN

## 2020-08-27 MED ORDER — OXYCODONE HCL 5 MG PO TABS
5.0000 mg | ORAL_TABLET | ORAL | Status: DC | PRN
  Administered 2020-08-28 – 2020-08-29 (×3): 10 mg via ORAL
  Filled 2020-08-27 (×3): qty 2

## 2020-08-27 MED ORDER — TIZANIDINE HCL 4 MG PO TABS
4.0000 mg | ORAL_TABLET | Freq: Every day | ORAL | Status: DC
  Administered 2020-08-27 – 2020-08-31 (×5): 4 mg via ORAL
  Filled 2020-08-27 (×8): qty 1

## 2020-08-27 MED ORDER — SODIUM CHLORIDE 0.9 % IV SOLN
2.0000 g | INTRAVENOUS | Status: DC
  Administered 2020-08-27 – 2020-08-28 (×2): 2 g via INTRAVENOUS
  Filled 2020-08-27: qty 2
  Filled 2020-08-27 (×2): qty 20

## 2020-08-27 MED ORDER — PROPOFOL 10 MG/ML IV BOLUS
INTRAVENOUS | Status: DC | PRN
  Administered 2020-08-27: 200 mg via INTRAVENOUS

## 2020-08-27 MED ORDER — ENOXAPARIN SODIUM 40 MG/0.4ML IJ SOSY
40.0000 mg | PREFILLED_SYRINGE | INTRAMUSCULAR | Status: DC
  Administered 2020-08-28 – 2020-09-01 (×5): 40 mg via SUBCUTANEOUS
  Filled 2020-08-27 (×5): qty 0.4

## 2020-08-27 MED ORDER — PHENYLEPHRINE HCL (PRESSORS) 10 MG/ML IV SOLN
INTRAVENOUS | Status: DC | PRN
  Administered 2020-08-27 (×2): 80 ug via INTRAVENOUS

## 2020-08-27 MED ORDER — DIPHENHYDRAMINE HCL 25 MG PO CAPS: 25.0000 mg | ORAL_CAPSULE | Freq: Every evening | ORAL | Status: DC | PRN

## 2020-08-27 MED ORDER — INSULIN GLARGINE 100 UNIT/ML ~~LOC~~ SOLN: 20.0000 [IU] | Freq: Two times a day (BID) | SUBCUTANEOUS | Status: DC

## 2020-08-27 MED ORDER — ENSURE MAX PROTEIN PO LIQD
11.0000 [oz_av] | Freq: Two times a day (BID) | ORAL | Status: DC
  Administered 2020-08-28 – 2020-09-01 (×7): 11 [oz_av] via ORAL
  Filled 2020-08-27 (×10): qty 330

## 2020-08-27 MED ORDER — BUPIVACAINE LIPOSOME 1.3 % IJ SUSP
INTRAMUSCULAR | Status: DC | PRN
  Administered 2020-08-27: 10 mL via PERINEURAL

## 2020-08-27 MED ORDER — LABETALOL HCL 5 MG/ML IV SOLN: 10.0000 mg | INTRAVENOUS | Status: DC | PRN

## 2020-08-27 MED ORDER — OXYCODONE-ACETAMINOPHEN 5-325 MG PO TABS: 1.0000 | ORAL_TABLET | ORAL | Status: DC | PRN

## 2020-08-27 MED ORDER — PROPOFOL 10 MG/ML IV BOLUS
INTRAVENOUS | Status: AC
  Filled 2020-08-27: qty 20

## 2020-08-27 MED ORDER — VANCOMYCIN HCL 1500 MG/300ML IV SOLN
1500.0000 mg | INTRAVENOUS | Status: DC
  Administered 2020-08-27: 1500 mg via INTRAVENOUS
  Filled 2020-08-27: qty 300

## 2020-08-27 MED ORDER — OXYCODONE HCL 5 MG PO TABS: 10.0000 mg | ORAL_TABLET | ORAL | Status: DC | PRN

## 2020-08-27 MED ORDER — GABAPENTIN 300 MG PO CAPS
300.0000 mg | ORAL_CAPSULE | Freq: Three times a day (TID) | ORAL | Status: DC
  Administered 2020-08-27 – 2020-09-01 (×15): 300 mg via ORAL
  Filled 2020-08-27 (×14): qty 1

## 2020-08-27 MED ORDER — LIDOCAINE 2% (20 MG/ML) 5 ML SYRINGE
INTRAMUSCULAR | Status: DC | PRN
  Administered 2020-08-27: 40 mg via INTRAVENOUS

## 2020-08-27 MED ORDER — ONDANSETRON HCL 4 MG PO TABS: 4.0000 mg | ORAL_TABLET | Freq: Four times a day (QID) | ORAL | Status: DC | PRN

## 2020-08-27 MED ORDER — DIPHENHYDRAMINE HCL 12.5 MG/5ML PO ELIX
12.5000 mg | ORAL_SOLUTION | ORAL | Status: DC | PRN
Start: 2020-08-27 — End: 2020-09-01
  Filled 2020-08-27: qty 10

## 2020-08-27 MED ORDER — 0.9 % SODIUM CHLORIDE (POUR BTL) OPTIME
TOPICAL | Status: DC | PRN
  Administered 2020-08-27 (×2): 1000 mL

## 2020-08-27 MED ORDER — BISACODYL 5 MG PO TBEC: 5.0000 mg | DELAYED_RELEASE_TABLET | Freq: Every day | ORAL | Status: DC | PRN

## 2020-08-27 MED ORDER — MAGNESIUM SULFATE 2 GM/50ML IV SOLN: 2.0000 g | Freq: Every day | INTRAVENOUS | Status: DC | PRN

## 2020-08-27 MED ORDER — ACETAMINOPHEN 500 MG PO TABS
1000.0000 mg | ORAL_TABLET | Freq: Four times a day (QID) | ORAL | Status: AC
  Administered 2020-08-27 – 2020-08-28 (×3): 1000 mg via ORAL
  Filled 2020-08-27 (×5): qty 2

## 2020-08-27 MED ORDER — BUPIVACAINE-EPINEPHRINE (PF) 0.5% -1:200000 IJ SOLN
INTRAMUSCULAR | Status: DC | PRN
  Administered 2020-08-27 (×2): 20 mL via PERINEURAL

## 2020-08-27 MED ORDER — DEXTROSE 5 % IV SOLN
INTRAVENOUS | Status: DC | PRN
  Administered 2020-08-27: 3 g via INTRAVENOUS

## 2020-08-27 MED ORDER — HYDRALAZINE HCL 20 MG/ML IJ SOLN
5.0000 mg | INTRAMUSCULAR | Status: DC | PRN
  Filled 2020-08-27: qty 1

## 2020-08-27 MED ORDER — POLYETHYLENE GLYCOL 3350 17 G PO PACK
17.0000 g | PACK | Freq: Every day | ORAL | Status: DC | PRN
  Filled 2020-08-27: qty 1

## 2020-08-27 MED ORDER — SODIUM CHLORIDE 0.9% IV SOLUTION: Freq: Once | INTRAVENOUS | Status: DC

## 2020-08-27 MED ORDER — ACETAMINOPHEN 10 MG/ML IV SOLN
INTRAVENOUS | Status: DC | PRN
  Administered 2020-08-27: 1000 mg via INTRAVENOUS

## 2020-08-27 MED ORDER — INSULIN ASPART 100 UNIT/ML IJ SOLN: 0.0000 [IU] | INTRAMUSCULAR | Status: DC

## 2020-08-27 MED ORDER — SODIUM CHLORIDE 0.9 % IV SOLN
10.0000 mL/h | Freq: Once | INTRAVENOUS | Status: AC
  Administered 2020-08-27: 10 mL/h via INTRAVENOUS

## 2020-08-27 SURGICAL SUPPLY — 57 items
BAG COUNTER SPONGE SURGICOUNT (BAG) ×4 IMPLANT
BAG SPNG CNTER NS LX DISP (BAG) ×2
BANDAGE ESMARK 6X9 LF (GAUZE/BANDAGES/DRESSINGS) ×1 IMPLANT
BLADE SAW GIGLI 510 (BLADE) ×1 IMPLANT
BLADE SAW SAG 29X58X.64 (BLADE) ×1 IMPLANT
BLADE SURG 10 STRL SS (BLADE) ×2 IMPLANT
BNDG CMPR 9X6 STRL LF SNTH (GAUZE/BANDAGES/DRESSINGS) ×1
BNDG CMPR MED 15X6 ELC VLCR LF (GAUZE/BANDAGES/DRESSINGS) ×2
BNDG COHESIVE 4X5 TAN STRL (GAUZE/BANDAGES/DRESSINGS) ×2 IMPLANT
BNDG ELASTIC 4X5.8 VLCR STR LF (GAUZE/BANDAGES/DRESSINGS) IMPLANT
BNDG ELASTIC 6X15 VLCR STRL LF (GAUZE/BANDAGES/DRESSINGS) ×2 IMPLANT
BNDG ELASTIC 6X5.8 VLCR STR LF (GAUZE/BANDAGES/DRESSINGS) ×1 IMPLANT
BNDG ESMARK 6X9 LF (GAUZE/BANDAGES/DRESSINGS) ×2
BNDG GAUZE ELAST 4 BULKY (GAUZE/BANDAGES/DRESSINGS) ×4 IMPLANT
CUFF TOURN SGL QUICK 34 (TOURNIQUET CUFF) ×2
CUFF TRNQT CYL 34X4.125X (TOURNIQUET CUFF) ×1 IMPLANT
DRAPE HALF SHEET 40X57 (DRAPES) ×4 IMPLANT
DRSG ADAPTIC 3X8 NADH LF (GAUZE/BANDAGES/DRESSINGS) ×2 IMPLANT
DRSG PAD ABDOMINAL 8X10 ST (GAUZE/BANDAGES/DRESSINGS) ×1 IMPLANT
DURAPREP 26ML APPLICATOR (WOUND CARE) ×2 IMPLANT
ELECT REM PT RETURN 9FT ADLT (ELECTROSURGICAL) ×2
ELECTRODE REM PT RTRN 9FT ADLT (ELECTROSURGICAL) ×1 IMPLANT
EVACUATOR 1/8 PVC DRAIN (DRAIN) IMPLANT
FACESHIELD WRAPAROUND (MASK) ×4 IMPLANT
FACESHIELD WRAPAROUND OR TEAM (MASK) ×2 IMPLANT
GAUZE SPONGE 4X4 12PLY STRL (GAUZE/BANDAGES/DRESSINGS) ×3 IMPLANT
GLOVE SRG 8 PF TXTR STRL LF DI (GLOVE) ×1 IMPLANT
GLOVE SURG 8.5 LATEX PF (GLOVE) ×2 IMPLANT
GLOVE SURG LTX SZ9 (GLOVE) ×2 IMPLANT
GLOVE SURG ORTHO LTX SZ7.5 (GLOVE) ×3 IMPLANT
GLOVE SURG UNDER POLY LF SZ8 (GLOVE) ×4
GOWN STRL REUS W/ TWL LRG LVL3 (GOWN DISPOSABLE) ×1 IMPLANT
GOWN STRL REUS W/TWL 2XL LVL3 (GOWN DISPOSABLE) ×4 IMPLANT
GOWN STRL REUS W/TWL LRG LVL3 (GOWN DISPOSABLE)
IMMOBILIZER KNEE 22 UNIV (SOFTGOODS) ×1 IMPLANT
KIT BASIN OR (CUSTOM PROCEDURE TRAY) ×2 IMPLANT
KIT TURNOVER KIT B (KITS) ×2 IMPLANT
NS IRRIG 1000ML POUR BTL (IV SOLUTION) ×2 IMPLANT
PACK GENERAL/GYN (CUSTOM PROCEDURE TRAY) ×2 IMPLANT
PAD ABD 8X10 STRL (GAUZE/BANDAGES/DRESSINGS) ×2 IMPLANT
SPONGE T-LAP 18X18 ~~LOC~~+RFID (SPONGE) ×4 IMPLANT
STAPLER VISISTAT 35W (STAPLE) ×2 IMPLANT
STOCKINETTE IMPERVIOUS LG (DRAPES) ×2 IMPLANT
SUT ETHILON 3 0 PS 1 (SUTURE) IMPLANT
SUT SILK 0 TIES 10X30 (SUTURE) ×2 IMPLANT
SUT SILK 2 0 (SUTURE)
SUT SILK 2 0 SH CR/8 (SUTURE) ×10 IMPLANT
SUT SILK 2-0 18XBRD TIE 12 (SUTURE) IMPLANT
SUT VIC AB 0 CT1 27 (SUTURE) ×4
SUT VIC AB 0 CT1 27XBRD ANBCTR (SUTURE) ×2 IMPLANT
SUT VIC AB 1 CT1 27 (SUTURE) ×8
SUT VIC AB 1 CT1 27XBRD ANBCTR (SUTURE) ×4 IMPLANT
SUT VIC AB 2-0 CT1 27 (SUTURE) ×2
SUT VIC AB 2-0 CT1 TAPERPNT 27 (SUTURE) ×2 IMPLANT
TOWEL GREEN STERILE (TOWEL DISPOSABLE) ×2 IMPLANT
TOWEL GREEN STERILE FF (TOWEL DISPOSABLE) ×2 IMPLANT
WATER STERILE IRR 1000ML POUR (IV SOLUTION) ×2 IMPLANT

## 2020-08-27 NOTE — Discharge Instructions (Signed)
    Keep knee splint and dressing dry. May use water impervious bag or cast bag and tub chair to shower Tape the top of bag to skin to avoid moisture soaking the dressing on the leg. Call if there is odor or saturation of dressing or worsening pain not controlled with medications. Call if fever greater than 101.5. Use crutches or walker no weight bearing on the right leg. Please follow up with an appointment with Dr. Louanne Skye  2 weeks from the time of surgery. Elevate as often as possible during the first week after surgery gradually increasing the time the leg is dependent or down there after. If swelling recurrs then elevate again. Wheel chair for longer distances. Take anticoagulant daily at 6 PM.

## 2020-08-27 NOTE — Transfer of Care (Signed)
Immediate Anesthesia Transfer of Care Note  Patient: Patrick [name]  Procedure(s) Performed: AMPUTATION BELOW KNEE (Right: Knee)  Patient Location: PACU  Anesthesia Type:General and Regional  Level of Consciousness: awake, alert  and oriented  Airway & Oxygen Therapy: Patient Spontanous Breathing and Patient connected to nasal cannula oxygen  Post-op Assessment: Report given to RN and Post -op Vital signs reviewed and stable  Post vital signs: Reviewed and stable  Last Vitals:  Vitals Value Taken Time  BP 141/82 08/27/20 2034  Temp    Pulse 94 08/27/20 2035  Resp 24 08/27/20 2035  SpO2 97 % 08/27/20 2035  Vitals shown include unvalidated device data.  Last Pain:  Vitals:   08/27/20 1604  TempSrc: Oral  PainSc:          Complications: No notable events documented.

## 2020-08-27 NOTE — Anesthesia Postprocedure Evaluation (Signed)
Anesthesia Post Note  Patient: Patrick [name]  Procedure(s) Performed: AMPUTATION BELOW KNEE (Right: Knee)     Patient location during evaluation: PACU Anesthesia Type: Regional and General Level of consciousness: awake and alert Pain management: pain level controlled Vital Signs Assessment: post-procedure vital signs reviewed and stable Respiratory status: spontaneous breathing, nonlabored ventilation and respiratory function stable Cardiovascular status: blood pressure returned to baseline and stable Postop Assessment: no apparent nausea or vomiting Anesthetic complications: no   No notable events documented.  Last Vitals:  Vitals:   08/27/20 2105 08/27/20 2130  BP: (!) 151/92 (!) 148/95  Pulse: 93 94  Resp: 17 20  Temp: 36.6 C 36.5 C  SpO2: 100% 93%    Last Pain:  Vitals:   08/27/20 2130  TempSrc: Oral  PainSc:                  Madiha Bambrick,W. EDMOND

## 2020-08-27 NOTE — ED Notes (Signed)
MD notified of hemoglobin 6.0

## 2020-08-27 NOTE — Progress Notes (Signed)
Inpatient Diabetes Program Recommendations  AACE/ADA: New Consensus Statement on Inpatient Glycemic Control (2015)  Target Ranges:  Prepandial:   less than 140 mg/dL      Peak postprandial:   less than 180 mg/dL (1-2 hours)      Critically ill patients:  140 - 180 mg/dL   Lab Results  Component Value Date   GLUCAP 144 (H) 08/27/2020   HGBA1C >15.5 (H) 06/06/2020    Review of Glycemic Control  Diabetes history: DM 2 Outpatient Diabetes medications: Lantus 60 units qam, 28 units qpm, Humalog 10 units tid, Metformin 500 mg bid Current orders for Inpatient glycemic control:  Lantus 20 units bid Novolog 0-15 units Q4 hours  Consult for lower extremity wound A1c >15.5% in 05/2020 DM Coordinator spoke with pt at that time. Pt just received new insurance and was able to continue to get their medications  Glucose level on presentation is fairly controlled the highest being 204.   New A1c pending  Will watch trends.   Thanks,  Tama Headings RN, MSN, BC-ADM Inpatient Diabetes Coordinator Team Pager [phone] (8a-5p)

## 2020-08-27 NOTE — Progress Notes (Signed)
PROGRESS NOTE    Patrick [name]  [id number] DOB: [birth year] DOA: 08/26/2020 PCP: [facility]    Chief Complaint  Patient presents with   Wound Infection    Right Foot    Brief Narrative:  History of hypertension, insulin-dependent type 2 diabetes, recent history of C. difficile ,present with right diabetic foot infection, found to have sepsis, acute anemia  Subjective:  Npo awaiting for surgery, denies significant pain in right foot, significant other at bedside   Assessment & Plan:   Principal Problem:   Sepsis due to cellulitis [facility]) Active Problems:   Insulin-requiring or dependent type II diabetes mellitus ([place])   HYPERTENSION, BENIGN ESSENTIAL   Diabetic foot ulcer ([place])   Diabetic foot infection ([place])   CKD (chronic kidney disease) stage 3, GFR 30-59 ml/min (HCC)   Osteomyelitis of right foot ([employer])   Anemia   Sepsis present on admission With tachycardia, tachypnea, low temp 100.1, WBC 14, right foot infection Blood culture obtained from ED no growth Is started on bank, Rocephin, Flagyl on admission  Osteomyelitis, abscess, ulceration /right diabetic foot -Orthopedic consulted who recommended BKA, appreciate Ortho input  Acute on chronic normocytic anemia -Received 2 units PRBC, Hgb increased from 6-8, Ortho recommend gave additional 1 units PRBC prior to surgery  FOBT + Per RN stool is brown with streaks of blood He denies ab pain, no n/v Recent h/o cdiff, still has diarrhea per patient  Recent history of C. Difficile in 06/2020 Per chart review, he was treated with oral vanc, but patient does not rememver Reports still have diarrhea, will check c diff, will likely need to restart oral van  Insulin-dependent type 2 diabetes, uncontrolled with hyperglycemia -A1c at 7.8% , is likely falsely low due to acute anemia and AKI -Today he is n.p.o., will lower insulin dose, increase insulin dose once to start on diet  CKDIIIb Cr appear at  baseline   Nutritional Assessment: The patient's BMI is: Body mass index is 35.3 kg/m.Marland Kitchen Seen by dietician.  I agree with the assessment and plan as outlined below:  Nutrition Status: Nutrition Problem: Increased nutrient needs Etiology: wound healing Signs/Symptoms: estimated needs Interventions: MVI, Ensure Enlive (each supplement provides 350kcal and 20 grams of protein)  .       Unresulted Labs (From admission, onward)     Start     Ordered   08/27/20 1249  C Difficile Quick Screen w PCR reflex  (C Difficile quick screen w PCR reflex panel )  Once, for 24 hours,   STAT       References:    CDiff Information Tool   08/27/20 1248   08/26/20 2325  CBC with Differential  ONCE - STAT,   STAT        08/26/20 2324   08/26/20 2146  CBC WITH DIFFERENTIAL  (Septic presentation on arrival (screening labs, nursing and treatment orders for obvious sepsis))  ONCE - STAT,   STAT        08/26/20 2145   08/26/20 2146  Urine culture  (Septic presentation on arrival (screening labs, nursing and treatment orders for obvious sepsis))  ONCE - STAT,   STAT        08/26/20 2145              DVT prophylaxis: SCDs Start: 08/27/20 0306   Code Status:full Family Communication: Significant other at bedside with permission Disposition:   Status is: Inpatient   Dispo: The patient is from: Home  Anticipated d/c is to: SNF              Anticipated d/c date is: To be determined, need Ortho clearance                Consultants:  Orthopedics  Procedures:  Plan for right BKA  Antimicrobials:   Anti-infectives (From admission, onward)    Start     Dose/Rate Route Frequency Ordered Stop   08/28/20 0000  [MAR Hold]  vancomycin (VANCOREADY) IVPB 1500 mg/300 mL        (MAR Hold since Sat 08/27/2020 at 1749.Hold Reason: Transfer to a Procedural area)   1,500 mg 150 mL/hr over 120 Minutes Intravenous Every 24 hours 08/27/20 0320     08/27/20 0600  [MAR Hold]  cefTRIAXone  (ROCEPHIN) 2 g in sodium chloride 0.9 % 100 mL IVPB        (MAR Hold since Sat 08/27/2020 at 1749.Hold Reason: Transfer to a Procedural area)   2 g 200 mL/hr over 30 Minutes Intravenous Every 24 hours 08/27/20 0256     08/27/20 0400  [MAR Hold]  metroNIDAZOLE (FLAGYL) IVPB 500 mg        (MAR Hold since Sat 08/27/2020 at 1749.Hold Reason: Transfer to a Procedural area)   500 mg 100 mL/hr over 60 Minutes Intravenous Every 8 hours 08/27/20 0256     08/26/20 2330  vancomycin (VANCOCIN) 2,500 mg in sodium chloride 0.9 % 500 mL IVPB        2,500 mg 250 mL/hr over 120 Minutes Intravenous  Once 08/26/20 2321 08/27/20 0415   08/26/20 2330  cefTRIAXone (ROCEPHIN) 2 g in sodium chloride 0.9 % 100 mL IVPB        2 g 200 mL/hr over 30 Minutes Intravenous  Once 08/26/20 2321 08/27/20 0120           Objective: Vitals:   08/27/20 1430 08/27/20 1445 08/27/20 1515 08/27/20 1604  BP: 122/85 137/90 (!) 149/91 (!) 155/96  Pulse: (!) 106 (!) 108 (!) 111   Resp: 18 (!) 22 15   Temp:    98.4 F (36.9 C)  TempSrc:    Oral  SpO2: 100% 99% 100%   Weight:      Height:        Intake/Output Summary (Last 24 hours) at 08/27/2020 1822 Last data filed at 08/27/2020 1603 Gross per 24 hour  Intake 3988.68 ml  Output --  Net 3988.68 ml   Filed Weights   08/26/20 2142  Weight: 131.5 kg    Examination:  General exam: calm, NAD Respiratory system: Clear to auscultation. Respiratory effort normal. Cardiovascular system: S1 & S2 heard, sinus tachycardia Gastrointestinal system: Abdomen is nondistended, soft and nontender.   normal bowel sounds heard. Central nervous system: Alert and oriented. No focal neurological deficits. Extremities: right foot wrapped  Skin: chronic venous stasis changed bilateral lower extremities Psychiatry: Judgement and insight appear normal. Mood & affect appropriate.     Data Reviewed: I have personally reviewed following labs and imaging studies  CBC: Recent Labs  Lab  08/26/20 2325 08/27/20 1024  WBC 14.0* 14.4*  NEUTROABS 10.3*  --   HGB 6.0* 8.3*  HCT 21.0* 27.3*  MCV 84.0 82.2  PLT 572* 588*    Basic Metabolic Panel: Recent Labs  Lab 08/26/20 2205 08/27/20 1024  NA 134* 136  K 4.4 3.8  CL 106 108  CO2 18* 18*  GLUCOSE 204* 143*  BUN 32* 30*  CREATININE 2.27* 1.96*  CALCIUM  8.7* 9.0    GFR: Estimated Creatinine Clearance: 64.5 mL/min (A) (by C-G formula based on SCr of 1.96 mg/dL (H)).  Liver Function Tests: Recent Labs  Lab 08/26/20 2205  AST 59*  ALT 81*  ALKPHOS 184*  BILITOT 0.7  PROT 7.1  ALBUMIN 2.4*    CBG: Recent Labs  Lab 08/27/20 0439 08/27/20 0803 08/27/20 1131  GLUCAP 193* 144* 140*     Recent Results (from the past 240 hour(s))  Resp Panel by RT-PCR (Flu A&B, Covid) Nasopharyngeal Swab     Status: None   Collection Time: 08/26/20  9:46 PM   Specimen: Nasopharyngeal Swab; Nasopharyngeal(NP) swabs in vial transport medium  Result Value Ref Range Status   SARS Coronavirus 2 by RT PCR NEGATIVE NEGATIVE Final    Comment: (NOTE) SARS-CoV-2 target nucleic acids are NOT DETECTED.  The SARS-CoV-2 RNA is generally detectable in upper respiratory specimens during the acute phase of infection. The lowest concentration of SARS-CoV-2 viral copies this assay can detect is 138 copies/mL. A negative result does not preclude SARS-Cov-2 infection and should not be used as the sole basis for treatment or other patient management decisions. A negative result may occur with  improper specimen collection/handling, submission of specimen other than nasopharyngeal swab, presence of viral mutation(s) within the areas targeted by this assay, and inadequate number of viral copies(<138 copies/mL). A negative result must be combined with clinical observations, patient history, and epidemiological information. The expected result is Negative.  Fact Sheet for Patients:  EntrepreneurPulse.com.au  Fact  Sheet for Healthcare Providers:  IncredibleEmployment.be  This test is no t yet approved or cleared by the [place] FDA and  has been authorized for detection and/or diagnosis of SARS-CoV-2 by FDA under an Emergency Use Authorization (EUA). This EUA will remain  in effect (meaning this test can be used) for the duration of the COVID-19 declaration under Section 564(b)(1) of the Act, 21 U.S.C.section 360bbb-3(b)(1), unless the authorization is terminated  or revoked sooner.       Influenza A by PCR NEGATIVE NEGATIVE Final   Influenza B by PCR NEGATIVE NEGATIVE Final    Comment: (NOTE) The Xpert Xpress SARS-CoV-2/FLU/RSV plus assay is intended as an aid in the diagnosis of influenza from Nasopharyngeal swab specimens and should not be used as a sole basis for treatment. Nasal washings and aspirates are unacceptable for Xpert Xpress SARS-CoV-2/FLU/RSV testing.  Fact Sheet for Patients: EntrepreneurPulse.com.au  Fact Sheet for Healthcare Providers: IncredibleEmployment.be  This test is not yet approved or cleared by the [place] FDA and has been authorized for detection and/or diagnosis of SARS-CoV-2 by FDA under an Emergency Use Authorization (EUA). This EUA will remain in effect (meaning this test can be used) for the duration of the COVID-19 declaration under Section 564(b)(1) of the Act, 21 U.S.C. section 360bbb-3(b)(1), unless the authorization is terminated or revoked.  Performed at [facility], [place] [address] [place], [place] [address]   Blood Culture (routine x 2)     Status: None (Preliminary result)   Collection Time: 08/26/20 10:05 PM   Specimen: BLOOD RIGHT ARM  Result Value Ref Range Status   Specimen Description BLOOD RIGHT ARM  Final   Special Requests   Final    BOTTLES DRAWN AEROBIC AND ANAEROBIC Blood Culture adequate volume   Culture   Final    NO GROWTH < 12 HOURS Performed at [facility], [place] [address], [place], [place] [address]    Report Status  PENDING  Incomplete  Blood Culture (routine x 2)     Status: None (Preliminary result)   Collection Time: 08/26/20 10:06 PM   Specimen: BLOOD LEFT ARM  Result Value Ref Range Status   Specimen Description BLOOD LEFT ARM  Final   Special Requests   Final    BOTTLES DRAWN AEROBIC ONLY Blood Culture adequate volume   Culture   Final    NO GROWTH < 12 HOURS Performed at [facility], [address] [place], [place] [address]    Report Status PENDING  Incomplete         Radiology Studies: DG Chest Port 1 View  Result Date: 08/26/2020 CLINICAL DATA:  Possible sepsis EXAM: PORTABLE CHEST 1 VIEW COMPARISON:  06/04/2020 FINDINGS: Mild cardiomegaly. No focal opacity or pleural effusion. No pneumothorax IMPRESSION: No active disease.  Mild cardiomegaly Electronically Signed   By: Donavan Foil M.D.   On: 08/26/2020 22:52   DG Foot Complete Right  Result Date: 08/26/2020 CLINICAL DATA:  Concern for foot infection EXAM: RIGHT FOOT COMPLETE - 3+ VIEW COMPARISON:  07/28/2020, 06/04/2020 FINDINGS: Patient is status post right fourth and fifth transmetatarsal amputation. Large irregular wound along the lateral aspect of the foot with gas in the soft tissues. Interval fragmentation and osseous destructive change of the fourth and fifth metatarsal remnant bases. Erosive change at the lateral cuboid. Increased periosteal reaction at the shaft of third metatarsal. Advanced degenerative changes of the first MTP joint. IMPRESSION: 1. 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 Gas in the soft tissues is presumably related to the large wound. 2. Increased periosteal reaction along the shaft of third metatarsal  question reactive versus due to infection though no gross osseous destructive change Electronically Signed   By: Donavan Foil M.D.   On: 08/26/2020 22:55        Scheduled Meds:  [id number] Hold] sodium chloride   Intravenous Once   [MAR Hold] gabapentin  300 mg Oral TID   [MAR Hold] insulin aspart  0-15 Units Subcutaneous Q4H   [MAR Hold] insulin glargine  20 Units Subcutaneous BID   [MAR Hold] metoprolol tartrate  2.5 mg Intravenous Q8H   [MAR Hold] multivitamin with minerals  1 tablet Oral Daily   [MAR Hold] Ensure Max Protein  11 oz Oral BID   [MAR Hold] tiZANidine  4 mg Oral QHS   Continuous Infusions:  sodium chloride 75 mL/hr at 08/27/20 1135   [MAR Hold] cefTRIAXone (ROCEPHIN)  IV Stopped (08/27/20 0650)   [MAR Hold] metronidazole Stopped (08/27/20 1249)   [MAR Hold] vancomycin       LOS: 0 days   Time spent: 40mns, case discussed with ortho Dr DSharol Givenand Dr NLouanne Skyeover the phone Greater than 50% of this time was spent in counseling, explanation of diagnosis, planning of further management, and coordination of care.   Voice Recognition /Viviann Sparedictation system was used to create this note, attempts have been made to correct errors. Please contact the author with questions and/or clarifications.   FFlorencia Reasons MD PhD FACP [facility]  Available via Epic secure chat 7am-7pm for nonurgent issues Please page for urgent issues To page the attending provider between 7A-7P or the covering provider during after hours 7P-7A, please log into the web site [email or web address] and  access using universal  password for that web site. If you do not have the password, please call the hospital operator.    08/27/2020, 6:22 PM

## 2020-08-27 NOTE — Consult Note (Signed)
[place] Nurse Consult Note: Reason for Consult:Chronic, nonhealing ulcerations of the right foot.  Dr.  Sharol Given has seen and Dr. Louanne Skye is to perform a transtibial amputation later today. Wound type:Neuropathic, infectious Pressure Injury POA: N/A  There is currently no role for WOC nursing.  [place] nursing team will not follow, but will remain available to this patient, the nursing and medical teams.  Please re-consult if needed. Thanks, Maudie Flakes, MSN, RN, [place], Arther Abbott  Pager# [phone]

## 2020-08-27 NOTE — Progress Notes (Signed)
Pharmacy Antibiotic Note  Patrick [name] is a 55 y.o. male admitted on 08/26/2020 with  diabetic foot infection .  Pharmacy has been consulted for vancomycin dosing.  Plan: Vancomycin '2500mg'$  x1 then '1500mg'$  IV Q24H. Goal AUC 400-550.  Expected AUC 530.  SCr 2.27.   Height: '6\' 4"'$  (193 cm) Weight: 131.5 kg (290 lb) IBW/kg (Calculated) : 86.8  Temp (24hrs), Avg:100 F (37.8 C), Min:99.8 F (37.7 C), Max:100.1 F (37.8 C)  Recent Labs  Lab 08/26/20 2205 08/26/20 2325 08/26/20 2346  WBC  --  14.0*  --   CREATININE 2.27*  --   --   LATICACIDVEN 1.8  --  1.3    Estimated Creatinine Clearance: 55.7 mL/min (A) (by C-G formula based on SCr of 2.27 mg/dL (H)).    No Known Allergies   Thank you for allowing pharmacy to be a part of this patient's care.  Wynona Neat, PharmD, BCPS  08/27/2020 3:21 AM

## 2020-08-27 NOTE — ED Provider Notes (Signed)
[facility] EMERGENCY DEPARTMENT Provider Note   CSN: [id number] Arrival date & time: 08/26/20  2012     History Chief Complaint  Patient presents with   Wound Infection    Right Foot    Patrick [name] is a 55 y.o. male.  55 y/o male with hx of HTN, DM, gout presents from UC for further evaluation. Went to Urgent Care to have his chronic foot wound re-wrapped and was noted to be tachycardic; transferred to the ED. Patient reports general feeling of malaise with chills, but denies known fever. Has been following with wound care for his chronic foot wound. Last debridement was on 08/24/20. He is still on abx, but is unsure which ones he is prescribed. Has been compliant with taking them. Was seen at [facility] on 08/23/20 for wound dressing change where there was concern for sepsis; patient declined additional work up at this visit.  Has been followed by a number of specialists for his right foot. Is s/p amputation of the 4th and 5th ray of the R foot by Dr. Sharol Given in April 2022. Was recommended for a transtibial amputation at his visit with Dr. Sharol Given in June due to progressive infection and wound breakdown, but opted to forego this plan for supportive therapies after seeking a second opinion.  The history is provided by the patient. No language interpreter was used.      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   Acute respiratory disease due to COVID-19 virus 05/03/2019   Renal insufficiency 05/03/2019   GOUT 01/27/2010   OBESITY 01/27/2010   Insulin-requiring or dependent type II diabetes mellitus ([place]) 02/20/2008   HYPERTENSION, BENIGN ESSENTIAL 02/20/2008    Past Surgical History:  Procedure Laterality Date   AMPUTATION Right 06/06/2020   Procedure: AMPUTATION 5TH RAY;  Surgeon: [name], MD;  Location: [facility];  Service: Orthopedics;  Laterality: Right;   AMPUTATION Right 06/08/2020   Procedure: RIGHT 4TH RAY AMPUTATION;  Surgeon: [name], MD;  Location: [facility];  Service: Orthopedics;  Laterality: Right;       Family History  Problem Relation Age of Onset   Diabetes Neg Hx     Social History   Tobacco Use   Smoking status: Never   Smokeless tobacco: Never  Substance Use Topics   Alcohol use: No   Drug use: No    Home Medications Prior to Admission medications   Medication Sig Start Date End Date Taking? Authorizing Provider  acetaminophen (TYLENOL) 325 MG tablet Take 650 mg by mouth every 6 (six) hours as needed for headache, fever or moderate pain.   Yes [provider]  allopurinol (ZYLOPRIM) 100 MG tablet Take 1 tablet (100 mg total) by mouth 2 (  two) times daily. 06/10/20  Yes Persons, Bevely Palmer, PA  amLODipine (NORVASC) 10 MG tablet Take 1 tablet (10 mg total) by mouth daily. 06/10/20  Yes Darliss Cheney, MD  blood glucose meter kit and supplies Dispense based on patient and insurance preference. Use up to four times daily as directed. (FOR ICD-10 E10.9, E11.9). 05/07/19  Yes Ghimire, Henreitta Leber, MD  gabapentin (NEURONTIN) 300 MG capsule Take 300 mg by mouth 3 (three) times daily. 03/17/20  Yes [provider]  indomethacin (INDOCIN) 25 MG capsule Take 50 mg by mouth 3 (three) times daily as needed for mild pain. 05/20/20  Yes [provider]  insulin  glargine (LANTUS SOLOSTAR) 100 UNIT/ML Solostar Pen Inject 28-60 Units into the skin 2 (two) times daily. Inject 60 units in the morning and 28 units at bedtime Patient taking differently: Inject 28-60 Units into the skin See admin instructions. Inject 60 units in the morning and 28 units at bedtime 06/10/20  Yes Pahwani, Ravi, MD  insulin lispro (HUMALOG) 200 UNIT/ML KwikPen Inject 10 Units into the skin 3 (three) times daily before meals. 06/14/20  Yes Danford, Suann Larry, MD  Insulin Pen Needle 32G X 8 MM MISC Use as directed 06/10/20  Yes Pahwani, Einar Grad, MD  losartan (COZAAR) 100 MG tablet Take 1 tablet (100 mg total) by mouth daily. 06/10/20 08/26/20 Yes Darliss Cheney, MD  metFORMIN (GLUCOPHAGE) 500 MG tablet Take 1 tablet (500 mg total) by mouth 2 (two) times daily with a meal. 05/07/19  Yes Ghimire, Henreitta Leber, MD  metoprolol tartrate (LOPRESSOR) 25 MG tablet Take 1 tablet (25 mg total) by mouth 2 (two) times daily. 06/10/20 08/26/20 Yes Pahwani, Einar Grad, MD  oxyCODONE-acetaminophen (PERCOCET) 5-325 MG tablet Take 1 tablet by mouth every 4 (four) hours as needed. Patient taking differently: Take 1 tablet by mouth every 4 (four) hours as needed for moderate pain. 06/10/20 06/10/21 Yes Persons, Bevely Palmer, PA  sodium hypochlorite (DAKIN'S 1/2 STRENGTH) external solution Apply 1 application topically See admin instructions. Apply to gauze and apply to wound twice daily 08/17/20  Yes [provider]  tiZANidine (ZANAFLEX) 4 MG tablet Take 4 mg by mouth at bedtime. 04/17/19  Yes [provider]  pantoprazole (PROTONIX) 40 MG tablet Take 1 tablet (40 mg total) by mouth daily. Patient not taking: Reported on 08/26/2020 06/11/20 07/11/20  Darliss Cheney, MD  vancomycin (VANCOCIN) 125 MG capsule Take 125 mg by mouth 4 (four) times daily. Patient not taking: No sig reported 07/14/20   [provider]    Allergies    Patient has no known allergies.  Review of Systems   Review of Systems Ten  systems reviewed and are negative for acute change, except as noted in the HPI.    Physical Exam Updated Vital Signs BP 128/68   Pulse (!) 117   Temp 100.1 F (37.8 C) (Oral)   Resp (!) 29   Ht '6\' 4"'  (1.93 m)   Wt 131.5 kg   SpO2 100%   BMI 35.30 kg/m   Physical Exam Vitals and nursing note reviewed.  Constitutional:      General: He is not in acute distress.    Appearance: He is well-developed. He is not diaphoretic.     Comments: Ill appearing, obese AA male  HENT:     Head: Normocephalic and atraumatic.  Eyes:     General: No scleral icterus.    Conjunctiva/sclera: Conjunctivae normal.  Cardiovascular:     Rate and Rhythm: Regular rhythm.  Tachycardia present.     Pulses: Normal pulses.     Comments: HR in 120's Pulmonary:     Effort: Pulmonary effort is normal. No respiratory distress.     Comments: Respirations even and unlabored Musculoskeletal:     Cervical back: Normal range of motion.       Feet:     Comments: Extensive open wound to R foot extending from the lateral aspect to the plantar portion of the foot.  Feet:     Comments: Purulent, foul-smelling drainage.  There is some heat to touch to the dorsum of the foot.  Mild erythema without lymphangitic streaking. Skin:    General: Skin is warm and dry.     Coloration: Skin is not pale.     Findings: No erythema or rash.  Neurological:     Mental Status: He is alert and oriented to person, place, and time.  Psychiatric:        Behavior: Behavior normal.    ED Results / Procedures / Treatments   Labs (all labs ordered are listed, but only abnormal results are displayed) Labs Reviewed  COMPREHENSIVE METABOLIC PANEL - Abnormal; Notable for the following components:      Result Value   Sodium 134 (*)    CO2 18 (*)    Glucose, Bld 204 (*)    BUN 32 (*)    Creatinine, Ser 2.27 (*)    Calcium 8.7 (*)    Albumin 2.4 (*)    AST 59 (*)    ALT 81 (*)    Alkaline Phosphatase 184 (*)    GFR, Estimated 34  (*)    All other components within normal limits  URINALYSIS, ROUTINE W REFLEX MICROSCOPIC - Abnormal; Notable for the following components:   APPearance HAZY (*)    Hgb urine dipstick SMALL (*)    Protein, ur 30 (*)    All other components within normal limits  CBC WITH DIFFERENTIAL/PLATELET - Abnormal; Notable for the following components:   WBC 14.0 (*)    RBC 2.50 (*)    Hemoglobin 6.0 (*)    HCT 21.0 (*)    MCH 24.0 (*)    MCHC 28.6 (*)    RDW 17.6 (*)    Platelets 572 (*)    Neutro Abs 10.3 (*)    Monocytes Absolute 1.1 (*)    Abs Immature Granulocytes 0.09 (*)    All other components within normal limits  PROTIME-INR - Abnormal; Notable for the following components:   Prothrombin Time 16.0 (*)    INR 1.3 (*)    All other components within normal limits  SEDIMENTATION RATE - Abnormal; Notable for the following components:   Sed Rate >140 (*)    All other components within normal limits  C-REACTIVE PROTEIN - Abnormal; Notable for the following components:   CRP 18.3 (*)    All other components within normal limits  RESP PANEL BY RT-PCR (FLU A&B, COVID) ARPGX2  CULTURE, BLOOD (ROUTINE X 2)  CULTURE, BLOOD (ROUTINE X 2)  URINE CULTURE  LACTIC ACID, PLASMA  LACTIC ACID, PLASMA  APTT  CBC WITH DIFFERENTIAL/PLATELET  CBC WITH DIFFERENTIAL/PLATELET  HEMOGLOBIN A1C  CBC  BASIC METABOLIC PANEL  POC OCCULT BLOOD, ED  TYPE AND SCREEN  PREPARE RBC (CROSSMATCH)    EKG None  Radiology DG Chest Port 1 View  Result Date: 08/26/2020 CLINICAL DATA:  Possible sepsis EXAM: PORTABLE CHEST 1 VIEW COMPARISON:  06/04/2020 FINDINGS: Mild cardiomegaly. No focal opacity or pleural  effusion. No pneumothorax IMPRESSION: No active disease.  Mild cardiomegaly Electronically Signed   By: Donavan Foil M.D.   On: 08/26/2020 22:52   DG Foot Complete Right  Result Date: 08/26/2020 CLINICAL DATA:  Concern for foot infection EXAM: RIGHT FOOT COMPLETE - 3+ VIEW COMPARISON:  07/28/2020,  06/04/2020 FINDINGS: Patient is status post right fourth and fifth transmetatarsal amputation. Large irregular wound along the lateral aspect of the foot with gas in the soft tissues. Interval fragmentation and osseous destructive change of the fourth and fifth metatarsal remnant bases. Erosive change at the lateral cuboid. Increased periosteal reaction at the shaft of third metatarsal. Advanced degenerative changes of the first MTP joint. IMPRESSION: 1. 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 Gas in the soft tissues is presumably related to the large wound. 2. Increased periosteal reaction along the shaft of third metatarsal question reactive versus due to infection though no gross osseous destructive change Electronically Signed   By: Donavan Foil M.D.   On: 08/26/2020 22:55    Procedures .Critical Care  Date/Time: 08/27/2020 3:12 AM Performed by: Antonietta Breach, PA-C Authorized by: Antonietta Breach, PA-C   Critical care provider statement:    Critical care time (minutes):  45   Critical care was necessary to treat or prevent imminent or life-threatening deterioration of the following conditions:  Sepsis and circulatory failure   Critical care was time spent personally by me on the following activities:  Discussions with consultants, evaluation of patient's response to treatment, examination of patient, ordering and performing treatments and interventions, ordering and review of laboratory studies, ordering and review of radiographic studies, pulse oximetry, re-evaluation of patient's condition, obtaining history from patient or surrogate and review of old charts   Medications Ordered in ED Medications  vancomycin (VANCOCIN) 2,500 mg in sodium chloride 0.9 % 500 mL IVPB (2,500 mg  Intravenous New Bag/Given 08/27/20 0121)  0.9 %  sodium chloride infusion (has no administration in time range)  cefTRIAXone (ROCEPHIN) 2 g in sodium chloride 0.9 % 100 mL IVPB (has no administration in time range)  metroNIDAZOLE (FLAGYL) IVPB 500 mg (has no administration in time range)  tiZANidine (ZANAFLEX) tablet 4 mg (has no administration in time range)  gabapentin (NEURONTIN) capsule 300 mg (has no administration in time range)  oxyCODONE-acetaminophen (PERCOCET/ROXICET) 5-325 MG per tablet 1 tablet (has no administration in time range)  insulin aspart (novoLOG) injection 0-15 Units (has no administration in time range)  insulin glargine (LANTUS) injection 20 Units (has no administration in time range)  acetaminophen (TYLENOL) tablet 650 mg (has no administration in time range)    Or  acetaminophen (TYLENOL) suppository 650 mg (has no administration in time range)  ondansetron (ZOFRAN) tablet 4 mg (has no administration in time range)    Or  ondansetron (ZOFRAN) injection 4 mg (has no administration in time range)  acetaminophen (TYLENOL) tablet 1,000 mg (1,000 mg Oral Given 08/27/20 0037)  lactated ringers bolus 1,000 mL (0 mLs Intravenous Stopped 08/27/20 0250)  cefTRIAXone (ROCEPHIN) 2 g in sodium chloride 0.9 % 100 mL IVPB (0 g Intravenous Stopped 08/27/20 0120)    ED Course  I have reviewed the triage vital signs and the nursing notes.  Pertinent labs & imaging results that were available during my care of the patient were reviewed  by me and considered in my medical decision making (see chart for details).  Clinical Course as of 08/27/20 0923  Sat Aug 27, 2020  0215 Spoke with Dr. Louanne Skye, on-call for [facility] orthopedics.  Their service will consult on the patient in the morning to discuss operative options. [KH]    Clinical Course User Index [KH] Antonietta Breach, PA-C   MDM Rules/Calculators/A&P                          55 year old male to be admitted for management of sepsis  secondary to osteomyelitis of the right foot.  Incidentally found to be anemic with hemoglobin of 6.  Has been receiving IV fluids as well as IV antibiotics.  Initiated transfusion of 2 units PRBCs.  Patient will be assessed by orthopedics in consultation; anticipate operative management.  He will be admitted by the hospitalist service.  Case discussed with Dr. Alcario Drought of [facility].   Final Clinical Impression(s) / ED Diagnoses Final diagnoses:  Osteomyelitis of right foot, unspecified type [facility])    Rx / DC Orders ED Discharge Orders     None        Antonietta Breach, PA-C 08/27/20 3007    Luna Fuse, MD 08/27/20 [phone]

## 2020-08-27 NOTE — Progress Notes (Signed)
Arrived to floor via pt bed from ED. OR sending transport to pick up pt for surgery. Pt verbalized understanding. NAD. No c/o expressed.

## 2020-08-27 NOTE — Anesthesia Preprocedure Evaluation (Addendum)
Anesthesia Evaluation  Patient identified by MRN, date of birth, ID band Patient awake    Reviewed: Allergy & Precautions, H&P , NPO status , Patient's Chart, lab work & pertinent test results, reviewed documented beta blocker date and time   Airway Mallampati: III  TM Distance: >3 FB Neck ROM: Full    Dental no notable dental hx. (+) Teeth Intact, Dental Advisory Given   Pulmonary neg pulmonary ROS,    Pulmonary exam normal breath sounds clear to auscultation       Cardiovascular hypertension, Pt. on medications and Pt. on home beta blockers  Rhythm:Regular Rate:Normal     Neuro/Psych negative neurological ROS  negative psych ROS   GI/Hepatic negative GI ROS, Neg liver ROS,   Endo/Other  diabetes, Insulin Dependent, Oral Hypoglycemic AgentsMorbid obesity  Renal/GU Renal InsufficiencyRenal disease  negative genitourinary   Musculoskeletal   Abdominal   Peds  Hematology  (+) Blood dyscrasia, anemia ,   Anesthesia Other Findings   Reproductive/Obstetrics negative OB ROS                            Anesthesia Physical Anesthesia Plan  ASA: 3  Anesthesia Plan: MAC and Regional   Post-op Pain Management:    Induction: Intravenous  PONV Risk Score and Plan: 2 and Propofol infusion, Midazolam and Ondansetron  Airway Management Planned: Simple Face Mask  Additional Equipment:   Intra-op Plan:   Post-operative Plan:   Informed Consent: I have reviewed the patients History and Physical, chart, labs and discussed the procedure including the risks, benefits and alternatives for the proposed anesthesia with the patient or authorized representative who has indicated his/her understanding and acceptance.     Dental advisory given  Plan Discussed with: CRNA  Anesthesia Plan Comments:         Anesthesia Quick Evaluation

## 2020-08-27 NOTE — Consult Note (Signed)
Reason for Consult:osteomyelitis right foot with nonhealing diabetic foot ulcers.  Referring Physician: Dr. Erlinda Hong. 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   JPE:TKKO F Petillo is an 55 y.o. male.KUE FOX is a 54 y.o. male who presents with progressive abscess ulceration with foul-smelling drainage lateral aspect of the right foot.  Patient was last seen in dr. Jess Barters office about a month ago.  Recommended proceeding with a transtibial amputation at that time.  Patient has been seen at the [facility] wound center for wound treatment.  Prior to this time patient has had 3 surgical interventions for foot salvage intervention his last debridement was performed at [facility] has osteomyelitis of the right foot and non healing diabetic ulcers. He is now with chills and fever. And is ready to proceed with surgical intervention and right below knee amputation. Anemia in ER and transfused 3 units of RBCs.  Patient has uncontrolled type 2 diabetes.    Past Medical History:  Diagnosis Date   Diabetes mellitus    Gout    History of COVID-19 04/2019   Hypertension     Past Surgical History:  Procedure Laterality Date   AMPUTATION Right 06/06/2020   Procedure: AMPUTATION 5TH RAY;  Surgeon: [name], MD;  Location: [place];  Service: Orthopedics;  Laterality: Right;   AMPUTATION Right 06/08/2020   Procedure: RIGHT 4TH RAY AMPUTATION;  Surgeon: [name], MD;  Location: [place];  Service: Orthopedics;  Laterality: Right;    Family History  Problem Relation Age of Onset   Diabetes Neg Hx     Social History:  reports that he has never smoked. He has never used smokeless tobacco. He reports that he does not drink alcohol and does not use drugs.  Allergies: No Known  Allergies  Medications: Prior to Admission:  Medications Prior to Admission  Medication Sig Dispense Refill Last Dose   acetaminophen (TYLENOL) 325 MG tablet Take 650 mg by mouth every 6 (six) hours as needed for headache, fever or moderate pain.   Past Week   allopurinol (ZYLOPRIM) 100 MG tablet Take 1 tablet (100 mg total) by mouth 2 (two) times daily. 60 tablet 3 08/26/2020   amLODipine (NORVASC) 10 MG tablet Take 1 tablet (10 mg total) by mouth daily. 30 tablet 1 08/26/2020   blood glucose meter kit and supplies Dispense based on patient and insurance preference. Use up to four times daily as directed. (FOR ICD-10 E10.9, E11.9). 1 each 0    gabapentin (NEURONTIN) 300 MG capsule Take 300 mg by mouth 3 (three) times daily.   08/26/2020   indomethacin (INDOCIN) 25 MG capsule Take 50 mg by mouth 3 (three) times daily as needed for mild pain.   Past Week   insulin glargine (LANTUS SOLOSTAR) 100 UNIT/ML Solostar Pen Inject 28-60 Units into the skin 2 (two) times daily. Inject 60 units in the morning and 28 units at bedtime (Patient taking differently: Inject 28-60 Units into the skin See admin instructions. Inject 60 units in the morning and 28 units at bedtime) 15 mL 1 08/26/2020   insulin lispro (HUMALOG) 200 UNIT/ML KwikPen Inject 10 Units into the skin 3 (three) times daily before meals. 6 mL 3 08/26/2020   Insulin Pen Needle 32G X 8 MM MISC Use as directed 100  each 1    losartan (COZAAR) 100 MG tablet Take 1 tablet (100 mg total) by mouth daily. 30 tablet 0 08/26/2020   metFORMIN (GLUCOPHAGE) 500 MG tablet Take 1 tablet (500 mg total) by mouth 2 (two) times daily with a meal. 60 tablet 0 08/26/2020   metoprolol tartrate (LOPRESSOR) 25 MG tablet Take 1 tablet (25 mg total) by mouth 2 (two) times daily. 60 tablet 0 08/26/2020 at 0900   oxyCODONE-acetaminophen (PERCOCET) 5-325 MG tablet Take 1 tablet by mouth every 4 (four) hours as needed. (Patient taking differently: Take 1 tablet by mouth every 4 (four) hours as  needed for moderate pain.) 30 tablet 0 Past Month   sodium hypochlorite (DAKIN'S 1/2 STRENGTH) external solution Apply 1 application topically See admin instructions. Apply to gauze and apply to wound twice daily   08/25/2020   tiZANidine (ZANAFLEX) 4 MG tablet Take 4 mg by mouth at bedtime.   08/25/2020   pantoprazole (PROTONIX) 40 MG tablet Take 1 tablet (40 mg total) by mouth daily. (Patient not taking: Reported on 08/26/2020) 30 tablet 0 Not Taking   vancomycin (VANCOCIN) 125 MG capsule Take 125 mg by mouth 4 (four) times daily. (Patient not taking: No sig reported)   Completed Course   Scheduled:  sodium chloride   Intravenous Once   gabapentin  300 mg Oral TID   insulin aspart  0-15 Units Subcutaneous Q4H   insulin glargine  20 Units Subcutaneous BID   metoprolol tartrate  2.5 mg Intravenous Q8H   tiZANidine  4 mg Oral QHS   SAY:[id number] **OR** acetaminophen, ondansetron **OR** ondansetron (ZOFRAN) IV, oxyCODONE-acetaminophen Anti-infectives (From admission, onward)    Start     Dose/Rate Route Frequency Ordered Stop   08/28/20 0000  vancomycin (VANCOREADY) IVPB 1500 mg/300 mL        1,500 mg 150 mL/hr over 120 Minutes Intravenous Every 24 hours 08/27/20 0320     08/27/20 0600  cefTRIAXone (ROCEPHIN) 2 g in sodium chloride 0.9 % 100 mL IVPB        2 g 200 mL/hr over 30 Minutes Intravenous Every 24 hours 08/27/20 0256     08/27/20 0400  metroNIDAZOLE (FLAGYL) IVPB 500 mg        500 mg 100 mL/hr over 60 Minutes Intravenous Every 8 hours 08/27/20 0256     08/26/20 2330  vancomycin (VANCOCIN) 2,500 mg in sodium chloride 0.9 % 500 mL IVPB        2,500 mg 250 mL/hr over 120 Minutes Intravenous  Once 08/26/20 2321 08/27/20 0415   08/26/20 2330  cefTRIAXone (ROCEPHIN) 2 g in sodium chloride 0.9 % 100 mL IVPB        2 g 200 mL/hr over 30 Minutes Intravenous  Once 08/26/20 2321 08/27/20 0120       Results for orders placed or performed during the hospital encounter of 08/26/20 (from  the past 48 hour(s))  Resp Panel by RT-PCR (Flu A&B, Covid) Nasopharyngeal Swab     Status: None   Collection Time: 08/26/20  9:46 PM   Specimen: Nasopharyngeal Swab; Nasopharyngeal(NP) swabs in vial transport medium  Result Value Ref Range   SARS Coronavirus 2 by RT PCR NEGATIVE NEGATIVE    Comment: (NOTE) SARS-CoV-2 target nucleic acids are NOT DETECTED.  The SARS-CoV-2 RNA is generally detectable in upper respiratory specimens during the acute phase of infection. The lowest concentration of SARS-CoV-2 viral copies this assay can detect is 138 copies/mL. A negative result does not preclude SARS-Cov-2 infection  and should not be used as the sole basis for treatment or other patient management decisions. A negative result may occur with  improper specimen collection/handling, submission of specimen other than nasopharyngeal swab, presence of viral mutation(s) within the areas targeted by this assay, and inadequate number of viral copies(<138 copies/mL). A negative result must be combined with clinical observations, patient history, and epidemiological information. The expected result is Negative.  Fact Sheet for Patients:  EntrepreneurPulse.com.au  Fact Sheet for Healthcare Providers:  IncredibleEmployment.be  This test is no t yet approved or cleared by the [place] FDA and  has been authorized for detection and/or diagnosis of SARS-CoV-2 by FDA under an Emergency Use Authorization (EUA). This EUA will remain  in effect (meaning this test can be used) for the duration of the COVID-19 declaration under Section 564(b)(1) of the Act, 21 U.S.C.section 360bbb-3(b)(1), unless the authorization is terminated  or revoked sooner.       Influenza A by PCR NEGATIVE NEGATIVE   Influenza B by PCR NEGATIVE NEGATIVE    Comment: (NOTE) The Xpert Xpress SARS-CoV-2/FLU/RSV plus assay is intended as an aid in the diagnosis of influenza from  Nasopharyngeal swab specimens and should not be used as a sole basis for treatment. Nasal washings and aspirates are unacceptable for Xpert Xpress SARS-CoV-2/FLU/RSV testing.  Fact Sheet for Patients: EntrepreneurPulse.com.au  Fact Sheet for Healthcare Providers: IncredibleEmployment.be  This test is not yet approved or cleared by the [place] FDA and has been authorized for detection and/or diagnosis of SARS-CoV-2 by FDA under an Emergency Use Authorization (EUA). This EUA will remain in effect (meaning this test can be used) for the duration of the COVID-19 declaration under Section 564(b)(1) of the Act, 21 U.S.C. section 360bbb-3(b)(1), unless the authorization is terminated or revoked.  Performed at [facility], [place] [address], [place], [place] [address]   Urinalysis, Routine w reflex microscopic Urine, Clean Catch     Status: Abnormal   Collection Time: 08/26/20  9:46 PM  Result Value Ref Range   Color, Urine YELLOW YELLOW   APPearance HAZY (A) CLEAR   Specific Gravity, Urine 1.017 1.005 - 1.030   pH 5.0 5.0 - 8.0   Glucose, UA NEGATIVE NEGATIVE mg/dL   Hgb urine dipstick SMALL (A) NEGATIVE   Bilirubin Urine NEGATIVE NEGATIVE   Ketones, ur NEGATIVE NEGATIVE mg/dL   Protein, ur 30 (A) NEGATIVE mg/dL   Nitrite NEGATIVE NEGATIVE   Leukocytes,Ua NEGATIVE NEGATIVE   RBC / HPF 0-5 0 - 5 RBC/hpf   WBC, UA 0-5 0 - 5 WBC/hpf   Bacteria, UA NONE SEEN NONE SEEN    Comment: Performed at [facility] [address] [place], [place] [address]  Lactic acid, plasma     Status: None   Collection Time: 08/26/20 10:05 PM  Result Value Ref Range   Lactic Acid, Venous 1.8 0.5 - 1.9 mmol/L    Comment: Performed at [facility] [address], [place], [place] [address]  Comprehensive metabolic panel     Status: Abnormal   Collection Time: 08/26/20 10:05 PM  Result Value Ref Range   Sodium 134 (L) 135 - 145 mmol/L    Potassium 4.4 3.5 - 5.1 mmol/L   Chloride 106 98 - 111 mmol/L   CO2 18 (L) 22 - 32 mmol/L   Glucose, Bld 204 (H) 70 - 99 mg/dL    Comment: Glucose reference range applies only to samples taken after fasting for at least  8 hours.   BUN 32 (H) 6 - 20 mg/dL   Creatinine, Ser 2.27 (H) 0.61 - 1.24 mg/dL   Calcium 8.7 (L) 8.9 - 10.3 mg/dL   Total Protein 7.1 6.5 - 8.1 g/dL   Albumin 2.4 (L) 3.5 - 5.0 g/dL   AST 59 (H) 15 - 41 U/L   ALT 81 (H) 0 - 44 U/L   Alkaline Phosphatase 184 (H) 38 - 126 U/L   Total Bilirubin 0.7 0.3 - 1.2 mg/dL   GFR, Estimated 34 (L) >60 mL/min    Comment: (NOTE) Calculated using the CKD-EPI Creatinine Equation (2021)    Anion gap 10 5 - 15    Comment: Performed at [facility], [place] [address], [place], [place] [address]  Blood Culture (routine x 2)     Status: None (Preliminary result)   Collection Time: 08/26/20 10:05 PM   Specimen: BLOOD RIGHT ARM  Result Value Ref Range   Specimen Description BLOOD RIGHT ARM    Special Requests      BOTTLES DRAWN AEROBIC AND ANAEROBIC Blood Culture adequate volume   Culture      NO GROWTH < 12 HOURS Performed at [facility], [place] [address], [place], [place] [address]    Report Status PENDING   Blood Culture (routine x 2)     Status: None (Preliminary result)   Collection Time: 08/26/20 10:06 PM   Specimen: BLOOD LEFT ARM  Result Value Ref Range   Specimen Description BLOOD LEFT ARM    Special Requests      BOTTLES DRAWN AEROBIC ONLY Blood Culture adequate volume   Culture      NO GROWTH < 12 HOURS Performed at [facility], [place] [address], [place], [place] [address]    Report Status PENDING   Protime-INR     Status: Abnormal   Collection Time: 08/26/20 11:12 PM  Result Value Ref Range   Prothrombin Time 16.0 (H) 11.4 - 15.2 seconds   INR 1.3 (H) 0.8 - 1.2    Comment: (NOTE) INR goal varies based on device and disease states. Performed at [facility], [place] [address] [place],  [place] [address]   APTT     Status: None   Collection Time: 08/26/20 11:12 PM  Result Value Ref Range   aPTT 31 24 - 36 seconds    Comment: Performed at [facility] [address] [place], [place] [address]  Sedimentation rate     Status: Abnormal   Collection Time: 08/26/20 11:12 PM  Result Value Ref Range   Sed Rate >140 (H) 0 - 16 mm/hr    Comment: Performed at [facility] [address], [place], [place] [address]  C-reactive protein     Status: Abnormal   Collection Time: 08/26/20 11:12 PM  Result Value Ref Range   CRP 18.3 (H) <1.0 mg/dL    Comment: Performed at [facility] [address] [place], [place] [address]  CBC with Differential/Platelet     Status: Abnormal   Collection Time: 08/26/20 11:25 PM  Result Value Ref Range   WBC 14.0 (H) 4.0 - 10.5 K/uL   RBC 2.50 (L) 4.22 - 5.81 MIL/uL   Hemoglobin 6.0 (LL) 13.0 - 17.0 g/dL    Comment: REPEATED TO VERIFY THIS CRITICAL RESULT HAS VERIFIED AND BEEN CALLED TO RN TATE GRACE BY MESSAN HOUEGNIFIO ON 07 09 2022 AT 0102, AND HAS BEEN READ BACK.  HCT 21.0 (L) 39.0 - 52.0 %   MCV 84.0 80.0 - 100.0 fL   MCH 24.0 (L) 26.0 - 34.0 pg   MCHC 28.6 (L) 30.0 - 36.0 g/dL   RDW 17.6 (H) 11.5 - 15.5 %   Platelets 572 (H) 150 - 400 K/uL   nRBC 0.0 0.0 - 0.2 %   Neutrophils Relative % 73 %   Neutro Abs 10.3 (H) 1.7 - 7.7 K/uL   Lymphocytes Relative 16 %   Lymphs Abs 2.2 0.7 - 4.0 K/uL   Monocytes Relative 8 %   Monocytes Absolute 1.1 (H) 0.1 - 1.0 K/uL   Eosinophils Relative 1 %   Eosinophils Absolute 0.2 0.0 - 0.5 K/uL   Basophils Relative 1 %   Basophils Absolute 0.1 0.0 - 0.1 K/uL   Immature Granulocytes 1 %   Abs Immature Granulocytes 0.09 (H) 0.00 - 0.07 K/uL    Comment: Performed at [facility] [address], [place], [place] [address]  Lactic acid, plasma     Status: None   Collection Time: 08/26/20 11:46 PM  Result Value Ref Range   Lactic Acid, Venous 1.3 0.5 - 1.9 mmol/L    Comment:  Performed at [facility] [address] [place], [place] [address]  Type and screen     Status: None (Preliminary result)   Collection Time: 08/27/20  1:18 AM  Result Value Ref Range   ABO/RH(D) AB POS    Antibody Screen NEG    Sample Expiration 08/30/2020,2359    Unit Number [id number]    Blood Component Type RED CELLS,LR    Unit division 00    Status of Unit ISSUED    Transfusion Status OK TO TRANSFUSE    Crossmatch Result Compatible    Unit Number [id number]    Blood Component Type RED CELLS,LR    Unit division 00    Status of Unit ISSUED    Transfusion Status OK TO TRANSFUSE    Crossmatch Result Compatible    Unit Number [id number]    Blood Component Type RED CELLS,LR    Unit division 00    Status of Unit ALLOCATED    Transfusion Status OK TO TRANSFUSE    Crossmatch Result Compatible    Unit Number [id number]    Blood Component Type RED CELLS,LR    Unit division 00    Status of Unit ISSUED    Transfusion Status OK TO TRANSFUSE    Crossmatch Result      Compatible Performed at [facility], [place] [address], [place], [place] [address]   Prepare RBC (crossmatch)     Status: None   Collection Time: 08/27/20  1:43 AM  Result Value Ref Range   Order Confirmation      ORDER PROCESSED BY BLOOD BANK Performed at [facility], [place] [address] [place],  [address]   CBG monitoring, ED     Status: Abnormal   Collection Time: 08/27/20  4:39 AM  Result Value Ref Range   Glucose-Capillary 193 (H) 70 - 99 mg/dL    Comment: Glucose reference range applies only to samples taken after fasting for at least 8 hours.  CBG monitoring, ED     Status: Abnormal   Collection Time: 08/27/20  8:03 AM  Result Value Ref Range   Glucose-Capillary 144 (H) 70 - 99 mg/dL    Comment: Glucose reference range applies only to samples taken after fasting for at least 8 hours.  POC occult blood, ED RN will collect     Status: Abnormal   Collection Time: 08/27/20   9:17 AM  Result Value Ref Range   Fecal Occult Bld POSITIVE (A) NEGATIVE  Prepare RBC (crossmatch)     Status: None   Collection Time: 08/27/20  9:38 AM  Result Value Ref Range   Order Confirmation      ORDER PROCESSED BY BLOOD BANK Performed at [facility], [place] [address], [place], [place] [address]   Hemoglobin A1c     Status: Abnormal   Collection Time: 08/27/20 10:24 AM  Result Value Ref Range   Hgb A1c MFr Bld 7.8 (H) 4.8 - 5.6 %    Comment: (NOTE) Pre diabetes:          5.7%-6.4%  Diabetes:              >6.4%  Glycemic control for   <7.0% adults with diabetes    Mean Plasma Glucose 177.16 mg/dL    Comment: Performed at [facility] [address] [place], [place] [address]  CBC     Status: Abnormal   Collection Time: 08/27/20 10:24 AM  Result Value Ref Range   WBC 14.4 (H) 4.0 - 10.5 K/uL   RBC 3.32 (L) 4.22 - 5.81 MIL/uL   Hemoglobin 8.3 (L) 13.0 - 17.0 g/dL    Comment: REPEATED TO VERIFY POST TRANSFUSION SPECIMEN    HCT 27.3 (L) 39.0 - 52.0 %   MCV 82.2 80.0 - 100.0 fL   MCH 25.0 (L) 26.0 - 34.0 pg   MCHC 30.4 30.0 - 36.0 g/dL   RDW 17.2 (H) 11.5 - 15.5 %   Platelets 588 (H) 150 - 400 K/uL   nRBC 0.0 0.0 - 0.2 %    Comment: Performed at [facility], [place] [address], [place], [place] [address]  Basic metabolic panel     Status: Abnormal   Collection Time: 08/27/20 10:24 AM  Result Value Ref Range   Sodium 136 135 - 145 mmol/L   Potassium 3.8 3.5 - 5.1 mmol/L   Chloride 108 98 - 111 mmol/L   CO2 18 (L) 22 - 32 mmol/L   Glucose, Bld 143 (H) 70 - 99 mg/dL    Comment: Glucose reference range applies only to samples taken after fasting for at least 8 hours.   BUN 30 (H) 6 - 20 mg/dL   Creatinine, Ser 1.96 (H) 0.61 - 1.24 mg/dL   Calcium 9.0 8.9 - 10.3 mg/dL   GFR, Estimated 40 (L) >60 mL/min    Comment: (NOTE) Calculated using the CKD-EPI Creatinine Equation (2021)    Anion gap 10 5 - 15    Comment: Performed at [facility] [address], [place], [place] [address]  CBG monitoring, ED     Status: Abnormal   Collection Time: 08/27/20 11:31 AM  Result Value Ref Range   Glucose-Capillary 140 (H) 70 - 99 mg/dL    Comment: Glucose reference range applies only to samples taken after fasting for at least 8 hours.    DG Chest Port 1 View  Result Date: 08/26/2020 CLINICAL DATA:  Possible sepsis EXAM: PORTABLE CHEST 1 VIEW COMPARISON:  06/04/2020 FINDINGS: Mild cardiomegaly. No focal opacity or pleural effusion. No pneumothorax IMPRESSION: No active disease.  Mild cardiomegaly Electronically Signed   By: Donavan Foil M.D.   On: 08/26/2020 22:52   DG Foot Complete Right  Result Date: 08/26/2020 CLINICAL DATA:  Concern for foot infection EXAM: RIGHT FOOT COMPLETE - 3+ VIEW COMPARISON:  07/28/2020, 06/04/2020 FINDINGS: Patient is status post right fourth and fifth transmetatarsal amputation. Large irregular wound along the lateral aspect of the foot with gas in the soft tissues. Interval fragmentation and osseous destructive change of the fourth and fifth metatarsal remnant bases. Erosive change at the lateral cuboid. Increased periosteal reaction at the shaft of third metatarsal. Advanced degenerative changes of the first MTP joint. IMPRESSION: 1. 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 Gas in the soft tissues is presumably related to the large wound. 2. Increased periosteal reaction along the shaft of third metatarsal question reactive versus due to infection though no gross osseous destructive change Electronically Signed   By: Donavan Foil M.D.   On: 08/26/2020 22:55    ROS Blood pressure (!) 155/96, pulse (!) 111, temperature 98.4 F (36.9 C), temperature source Oral, resp. rate 15, height _0  (1.93  m), weight 131.5 kg, SpO2 100 %. Physical Exam General: Alert, no acute distress Psychiatric: Patient is competent for consent with normal mood and affect Lymphatic: No axillary or cervical lymphadenopathy Cardiovascular: No pedal edema Respiratory: No cyanosis, no use of accessory musculature GI: No organomegaly, abdomen is soft and non-tender  Orthopaedic Exam: Physical Exam:Right foot with lateral large open necrotic ulcer with exposed bone. 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   Dr. Sharol Given saw this patient today but is not able to be available to perform right below knee amputation. I will proceed with the right  Below amputation as this patient is septic and without amputation is  Likely to show further sepsis and failure of systems due to sepsis.Patient understands the risks of surgery, infection is present and there is a risk that the amputation area continues with infection. Nerve pain and phantom limb pain  can occur. Will try to provide enough residual limb to allow for prosthetic replacement.  [name] 08/27/2020, 4:21 PM

## 2020-08-27 NOTE — Progress Notes (Signed)
Pt being followed by [facility] for Sepsis protocol. 

## 2020-08-27 NOTE — H&P (Signed)
History and Physical    Patrick [name] [id number] DOB: 1965-07-23 DOA: 08/26/2020  PCP: [facility]  Patient coming from: Home  I have personally briefly reviewed patient's old medical records in [place]  Chief Complaint: Diabetic foot infection  HPI: Patrick [name] is a 55 y.o. male with medical history significant of DM2, HTN.  Pt has been battling with diabetic foot ulcer / infection / non-healing toe amputation site of Right foot for the past couple of months.  Admitted in April with diabetic foot infection, underwent 4th and 5th ray amputations.  Dr. Sharol Given feels no further salvage options for his foot and recd BKA.  Pt says podiatrist he saw for 2nd opinion who thought it might be salvageable.  Unfortunately pt now presents to ED after UC sent him in due to concern for sepsis.  Pt has malaise with chills.  Following with wound care.  Last debridement was 08/24/20.  Still on ABx he says.  Seen at [facility] 08/23/20, concern for sepsis with his dressing change but declined further work up.  No CP, no SOB.   ED Course: Pt with tachycardia, mild tachypnea, Tm 100.1 today "been going on at home all week" per pt.  WBC 14k HGB 6.0  Pt denies melena, hematochezia or bleeding other than from foot.   Review of Systems: As per HPI, otherwise all review of systems negative.  Past Medical History:  Diagnosis Date   Diabetes mellitus    Gout    History of COVID-19 04/2019   Hypertension     Past Surgical History:  Procedure Laterality Date   AMPUTATION Right 06/06/2020   Procedure: AMPUTATION 5TH RAY;  Surgeon: [name], MD;  Location: [place];  Service: Orthopedics;  Laterality: Right;   AMPUTATION Right 06/08/2020   Procedure: RIGHT 4TH RAY AMPUTATION;  Surgeon: [name], MD;  Location: [place];  Service: Orthopedics;  Laterality: Right;     reports that he has never smoked. He has never used smokeless tobacco. He reports that he does not drink alcohol  and does not use drugs.  No Known Allergies  Family History  Problem Relation Age of Onset   Diabetes Neg Hx      Prior to Admission medications   Medication Sig Start Date End Date Taking? Authorizing Provider  acetaminophen (TYLENOL) 325 MG tablet Take 650 mg by mouth every 6 (six) hours as needed for headache, fever or moderate pain.   Yes [provider]  allopurinol (ZYLOPRIM) 100 MG tablet Take 1 tablet (100 mg total) by mouth 2 (two) times daily. 06/10/20  Yes Persons, Bevely Palmer, PA  amLODipine (NORVASC) 10 MG tablet Take 1 tablet (10 mg total) by mouth daily. 06/10/20  Yes Darliss Cheney, MD  blood glucose meter kit and supplies Dispense based on patient and insurance preference. Use up to four times daily as directed. (FOR ICD-10 E10.9, E11.9). 05/07/19  Yes Ghimire, Henreitta Leber, MD  gabapentin (NEURONTIN) 300 MG capsule Take 300 mg by mouth 3 (three) times daily. 03/17/20  Yes [provider]  indomethacin (INDOCIN) 25 MG capsule Take 50 mg by mouth 3 (three) times daily as needed for mild pain. 05/20/20  Yes [provider]  insulin glargine (LANTUS SOLOSTAR) 100 UNIT/ML Solostar Pen Inject 28-60 Units into the skin 2 (two) times daily. Inject 60 units in the morning and 28 units at bedtime Patient taking differently: Inject 28-60 Units into the skin See admin instructions. Inject 60 units  in the morning and 28 units at bedtime 06/10/20  Yes Pahwani, Ravi, MD  insulin lispro (HUMALOG) 200 UNIT/ML KwikPen Inject 10 Units into the skin 3 (three) times daily before meals. 06/14/20  Yes Danford, Suann Larry, MD  Insulin Pen Needle 32G X 8 MM MISC Use as directed 06/10/20  Yes Pahwani, Einar Grad, MD  losartan (COZAAR) 100 MG tablet Take 1 tablet (100 mg total) by mouth daily. 06/10/20 08/26/20 Yes Darliss Cheney, MD  metFORMIN (GLUCOPHAGE) 500 MG tablet Take 1 tablet (500 mg total) by mouth 2 (two) times daily with a meal. 05/07/19  Yes Ghimire, Henreitta Leber, MD  metoprolol tartrate  (LOPRESSOR) 25 MG tablet Take 1 tablet (25 mg total) by mouth 2 (two) times daily. 06/10/20 08/26/20 Yes Pahwani, Einar Grad, MD  oxyCODONE-acetaminophen (PERCOCET) 5-325 MG tablet Take 1 tablet by mouth every 4 (four) hours as needed. Patient taking differently: Take 1 tablet by mouth every 4 (four) hours as needed for moderate pain. 06/10/20 06/10/21 Yes Persons, Bevely Palmer, PA  sodium hypochlorite (DAKIN'S 1/2 STRENGTH) external solution Apply 1 application topically See admin instructions. Apply to gauze and apply to wound twice daily 08/17/20  Yes [provider]  tiZANidine (ZANAFLEX) 4 MG tablet Take 4 mg by mouth at bedtime. 04/17/19  Yes [provider]  pantoprazole (PROTONIX) 40 MG tablet Take 1 tablet (40 mg total) by mouth daily. Patient not taking: Reported on 08/26/2020 06/11/20 07/11/20  Darliss Cheney, MD  vancomycin (VANCOCIN) 125 MG capsule Take 125 mg by mouth 4 (four) times daily. Patient not taking: No sig reported 07/14/20   [provider]    Physical Exam: Vitals:   08/27/20 0130 08/27/20 0145 08/27/20 0200 08/27/20 0215  BP: 122/71 (!) 148/87 (!) 154/63 128/68  Pulse: (!) 117 (!) 117 (!) 117 (!) 117  Resp: 19 (!) 22 (!) 25 (!) 29  Temp:      TempSrc:      SpO2: 100% 100% 99% 100%  Weight:      Height:        Constitutional: Ill appearing Eyes: PERRL, lids and conjunctivae normal ENMT: Mucous membranes are moist. Posterior pharynx clear of any exudate or lesions.Normal dentition.  Neck: normal, supple, no masses, no thyromegaly Respiratory: clear to auscultation bilaterally, no wheezing, no crackles. Normal respiratory effort. No accessory muscle use.  Cardiovascular: Tachycardic Abdomen: no tenderness, no masses palpated. No hepatosplenomegaly. Bowel sounds positive.  Musculoskeletal: no clubbing / cyanosis. No joint deformity upper and lower extremities. Good ROM, no contractures. Normal muscle tone.  Skin: Extensive open draining wound to R foot with  purulent drainage.  Mild erythema with lymphangitic streaking. Neurologic: CN 2-12 grossly intact. Sensation intact, DTR normal. Strength 5/5 in all 4.  Psychiatric: Normal judgment and insight. Alert and oriented x 3. Normal mood.    Labs on Admission: I have personally reviewed following labs and imaging studies  CBC: Recent Labs  Lab 08/26/20 2325  WBC 14.0*  NEUTROABS 10.3*  HGB 6.0*  HCT 21.0*  MCV 84.0  PLT 191*   Basic Metabolic Panel: Recent Labs  Lab 08/26/20 2205  NA 134*  K 4.4  CL 106  CO2 18*  GLUCOSE 204*  BUN 32*  CREATININE 2.27*  CALCIUM 8.7*   GFR: Estimated Creatinine Clearance: 55.7 mL/min (A) (by C-G formula based on SCr of 2.27 mg/dL (H)). Liver Function Tests: Recent Labs  Lab 08/26/20 2205  AST 59*  ALT 81*  ALKPHOS 184*  BILITOT 0.7  PROT 7.1  ALBUMIN 2.4*   No results for input(s): LIPASE, AMYLASE in the last 168 hours. No results for input(s): AMMONIA in the last 168 hours. Coagulation Profile: Recent Labs  Lab 08/26/20 2312  INR 1.3*   Cardiac Enzymes: No results for input(s): CKTOTAL, CKMB, CKMBINDEX, TROPONINI in the last 168 hours. BNP (last 3 results) No results for input(s): PROBNP in the last 8760 hours. HbA1C: No results for input(s): HGBA1C in the last 72 hours. CBG: No results for input(s): GLUCAP in the last 168 hours. Lipid Profile: No results for input(s): CHOL, HDL, LDLCALC, TRIG, CHOLHDL, LDLDIRECT in the last 72 hours. Thyroid Function Tests: No results for input(s): TSH, T4TOTAL, FREET4, T3FREE, THYROIDAB in the last 72 hours. Anemia Panel: No results for input(s): VITAMINB12, FOLATE, FERRITIN, TIBC, IRON, RETICCTPCT in the last 72 hours. Urine analysis:    Component Value Date/Time   COLORURINE YELLOW 08/26/2020 2146   APPEARANCEUR HAZY (A) 08/26/2020 2146   LABSPEC 1.017 08/26/2020 2146   PHURINE 5.0 08/26/2020 2146   GLUCOSEU NEGATIVE 08/26/2020 2146   HGBUR SMALL (A) 08/26/2020 2146   HGBUR  negative 01/27/2010 [place] NEGATIVE 08/26/2020 2146   [place] NEGATIVE 08/26/2020 2146   PROTEINUR 30 (A) 08/26/2020 2146   UROBILINOGEN 0.2 10/26/2013 1130   NITRITE NEGATIVE 08/26/2020 2146   LEUKOCYTESUR NEGATIVE 08/26/2020 2146    Radiological Exams on Admission: DG Chest Port 1 View  Result Date: 08/26/2020 CLINICAL DATA:  Possible sepsis EXAM: PORTABLE CHEST 1 VIEW COMPARISON:  06/04/2020 FINDINGS: Mild cardiomegaly. No focal opacity or pleural effusion. No pneumothorax IMPRESSION: No active disease.  Mild cardiomegaly Electronically Signed   By: Donavan Foil M.D.   On: 08/26/2020 22:52   DG Foot Complete Right  Result Date: 08/26/2020 CLINICAL DATA:  Concern for foot infection EXAM: RIGHT FOOT COMPLETE - 3+ VIEW COMPARISON:  07/28/2020, 06/04/2020 FINDINGS: Patient is status post right fourth and fifth transmetatarsal amputation. Large irregular wound along the lateral aspect of the foot with gas in the soft tissues. Interval fragmentation and osseous destructive change of the fourth and fifth metatarsal remnant bases. Erosive change at the lateral cuboid. Increased periosteal reaction at the shaft of third metatarsal. Advanced degenerative changes of the first MTP joint. IMPRESSION: 1. 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 Gas in the soft tissues is presumably related to the large wound. 2. Increased periosteal reaction along the shaft of third metatarsal question reactive versus due to infection though no gross osseous destructive change Electronically Signed   By: Donavan Foil M.D.   On: 08/26/2020 22:55    EKG: Independently reviewed.  Assessment/Plan Principal Problem:   Sepsis due to cellulitis [facility]) Active Problems:   Insulin-requiring  or dependent type II diabetes mellitus (HCC)   HYPERTENSION, BENIGN ESSENTIAL   Diabetic foot ulcer ([place])   Diabetic foot infection ([place])   CKD (chronic kidney disease) stage 3, GFR 30-59 ml/min (HCC)   Anemia    Sepsis secondary to diabetic foot infection - LE wound pathway Rocephin, flagyl, vanc IVF: 1L bolus in ED given, then 2u PRBC transfusion BCx Ortho to see in AM Keeping pt NPO, suspect he will need surgical intervention. Repeat labs in AM Anemia - 2u PRBC transfusion Due to sepsis and foot? No other obvious bleeding source HTN - Holding  BP meds due to early sepsis CKD 3b - Chronic, stable baseline DM2 - While NPO: Lantus 20u BID Mod scale SSI Q4H  DVT prophylaxis: SCDs - Anemia and likely needs surgery anyhow Code Status: Full Family Communication: No family in room Disposition Plan: Home after sepsis resolved and foot managed by ortho Consults called: EDP called Dr. Louanne Skye, ortho will see in AM Admission status: Admit to inpatient  Severity of Illness: The appropriate patient status for this patient is INPATIENT. Inpatient status is judged to be reasonable and necessary in order to provide the required intensity of service to ensure the patient's safety. The patient's presenting symptoms, physical exam findings, and initial radiographic and laboratory data in the context of their chronic comorbidities is felt to place them at high risk for further clinical deterioration. Furthermore, it is not anticipated that the patient will be medically stable for discharge from the hospital within 2 midnights of admission. The following factors support the patient status of inpatient.   IP status due to limb and life threatening infection of R foot.   * I certify that at the point of admission it is my clinical judgment that the patient will require inpatient hospital care spanning beyond 2 midnights from the point of admission due to high intensity of service, high risk for  further deterioration and high frequency of surveillance required.*   Amybeth Sieg M. DO [facility]  How to contact the [facility] Attending or Consulting provider [place] or covering provider during after hours [place], for this patient?  Check the care team in [facility] and look for a) attending/consulting [facility] provider listed and b) the [facility] team listed Log into [email or web address]  Amion Physician Scheduling and messaging for groups and whole hospitals  On call and physician scheduling software for group practices, residents, hospitalists and other medical providers for call, clinic, rotation and shift schedules. On[employer] is a hospital-wide system for scheduling doctors and paging doctors on call. EasyPlot is for scientific plotting and data analysis.  [email or web address]  and use [place]'s universal password to access. If you do not have the password, please contact the hospital operator.  Locate the [facility] provider you are looking for under [facility] and page to a number that you can be directly reached. If you still have difficulty reaching the provider, please page the [facility] (Director on Call) for the Hospitalists listed on amion for assistance.  08/27/2020, 3:22 AM

## 2020-08-27 NOTE — Anesthesia Procedure Notes (Signed)
Procedure Name: LMA Insertion Date/Time: 08/27/2020 5:53 PM Performed by: Clearnce Sorrel, CRNA Pre-anesthesia Checklist: Patient identified, Emergency Drugs available, Suction available and Patient being monitored Patient Re-evaluated:Patient Re-evaluated prior to induction Oxygen Delivery Method: Circle System Utilized Preoxygenation: Pre-oxygenation with 100% oxygen Induction Type: IV induction Ventilation: Mask ventilation without difficulty LMA: LMA inserted LMA Size: 5.0 Number of attempts: 1 Airway Equipment and Method: Bite block Placement Confirmation: positive ETCO2 Tube secured with: Tape Dental Injury: Teeth and Oropharynx as per pre-operative assessment

## 2020-08-27 NOTE — Progress Notes (Signed)
Patient ID: Patrick [name], male   DOB: 12/21/1965, 55 y.o.   MRN: [id number] Trying to find patient not in room 026. Now appears to be in 040. Will see and schedule for OR.

## 2020-08-27 NOTE — Progress Notes (Signed)
Initial Nutrition Assessment  DOCUMENTATION CODES:   Obesity unspecified  INTERVENTION:   When diet is advanced after surgery, start PO supplements: Ensure Max po BID, each supplement provides 150 kcal and 30 grams of protein.  MVI with minerals daily.  NUTRITION DIAGNOSIS:   Increased nutrient needs related to wound healing as evidenced by estimated needs.  GOAL:   Patient will meet greater than or equal to 90% of their needs  MONITOR:   PO intake, Supplement acceptance, Labs, Skin  REASON FOR ASSESSMENT:   Consult Wound healing  ASSESSMENT:   55 yo male admitted with R foot osteomyelitis with nonhealing diabetic foot ulcers. PMH includes gout, DM, HTN, COVID-19, R foot 4th & 5th ray amputations.  Plans for R BKA today. Unable to speak with patient at this time to obtain nutrition history.  Labs reviewed. A1C 7.8, FOBT positive CBG: 193-144-140  Medications reviewed and include novolog, lantus, flagyl, rocephin, vancomycin. IVF: NS at 75 ml/h  Weight history reviewed. Usual weight 145.8 kg (06/04/20) Current weight 131.5 kg (08/26/20) 10% weight loss within 3 months is severe.  Suspect patient is malnourished with recent weight loss. Unable to obtain enough information at this time for identification of malnutrition.   NUTRITION - FOCUSED PHYSICAL EXAM:  Unable to complete  Diet Order:   Diet Order             Diet NPO time specified  Diet effective now                   EDUCATION NEEDS:   Not appropriate for education at this time  Skin:  Skin Assessment: Skin Integrity Issues: Skin Integrity Issues:: Diabetic Ulcer Diabetic Ulcer: R foot osteomyelitis with nonhealing DM foot ulcers  Last BM:  no BM documented  Height:   Ht Readings from Last 1 Encounters:  08/26/20 '6\' 4"'$  (1.93 m)    Weight:   Wt Readings from Last 1 Encounters:  08/26/20 131.5 kg    BMI:  Body mass index is 35.3 kg/m.  Estimated Nutritional Needs:   Kcal:   2400-2600  Protein:  140-160 gm  Fluid:  >/= 2.4 L    Lucas Mallow, RD, LDN, CNSC Please refer to Amion for contact information.

## 2020-08-27 NOTE — Anesthesia Procedure Notes (Signed)
Anesthesia Regional Block: Popliteal block   Pre-Anesthetic Checklist: , timeout performed,  Correct Patient, Correct Site, Correct Laterality,  Correct Procedure, Correct Position, site marked,  Risks and benefits discussed,  Pre-op evaluation,  At surgeon's request and post-op pain management  Laterality: Right  Prep: Maximum Sterile Barrier Precautions used, chloraprep       Needles:  Injection technique: Single-shot  Needle Type: Echogenic Stimulator Needle     Needle Length: 9cm  Needle Gauge: 21     Additional Needles:   Procedures:, nerve stimulator,,, ultrasound used (permanent image in chart),,     Nerve Stimulator or Paresthesia:  Response: Tibial, 0.6 mA  Additional Responses:   Narrative:  Start time: 08/27/2020 5:21 PM End time: 08/27/2020 5:31 PM Injection made incrementally with aspirations every 5 mL. Anesthesiologist: Roderic Palau, MD  Additional Notes: 2% Lidocaine skin wheel. Adductor canal block with 20cc of 0.5% Bupivicaine w/1:200k epi.

## 2020-08-27 NOTE — ED Notes (Signed)
IV team at bedside 

## 2020-08-27 NOTE — Op Note (Signed)
08/27/2020  8:49 PM  PATIENT:  Patrick [name]  55 y.o. male  MRN: [id number]  OPERATIVE REPORT  PRE-OPERATIVE DIAGNOSIS:  osteomyelitis right tibia and open ulcers right foot  POST-OPERATIVE DIAGNOSIS:  osteomyelitis right tibia and open ulcers right foot  PROCEDURE:  Procedure(s): AMPUTATION BELOW KNEE    SURGEON:  Jessy Oto, MD       ANESTHESIA:  Alla Feeling  EBL: 600 cc.   TOTAL TOURNIQUET TIME: 300 mmHG 37 min    COMPLICATIONS:  None.     PROCEDURE:The patient was met in the holding area, and the appropriate right leg identified and marked with an "X" and my initials.  The patient was then transported to OR and was placed on the operative table in a supine position. The patient was then placed under general anesthesia without difficulty. The patient received ancef 3 grams intraoperative antibiotic prophylaxis . Tourniquet was applied to the operative thigh. Leg was then prepped using sterile conditions and draped using sterile technique. Time-out procedure was called and correct .  The right leg was elevated and thigh tourniquet inflated to 300 mm Hg. The right foot was excluded from the field with an iodine Vi drape and the right leg prepped first with a betadiene scrub for 4 minutes then the right leg prepped with iodophor prep from the right upper ankle to the right upper thigh, a sterile stockingnette covering the riight foot with coban. The right leg was elevated following correct time out protocal. The right leg exsanguinated with esmarch bandage and the the right thigh tourniquet inflated to 300 mmHG.  The standard posterior flap BKA was mapped out with an OR marking pen with the expected length of residual tibia measured from the medial joint line expected to be between 15 and 17 cm. The incision then made down to the anterior periosteum and then through the anterior compartment superficial fascia. The incision then carried along the interval between the  anterior compartment and the right lateral compartment and along the medial right leg anterior aspect of the posterior compartment. THe anterior compartment then divided sharply with a 10 blade scapel and the anterior tibial vessels identified these were clamped with hemostats x 2 an the incision carried into the interval between the anterior and lateral compartments finding the superficial and deep peroneal vessels. The nerves placed under tension and divided with scalpal. The vessels clamped with hemostats. The periosteum then divided over the tibia at the expected 15 cm length and a Cobb elevator used to elevate the posterior periosteum and a sponge then placed posterior to the right tibia shaft. A gigli saw then passed and the tibia divided posterior to anterior beveling the anterior surface.. Similarly the fibula exposed and then a sponge passed beneath the diaphysis about 1 cm proximal to the cut end of the tibia and an oscillating saw then used to cut the fibula. The distal leg was drawn forward and the posterior compartment and lateral compartment stripped off the distal tibia. The Skin incision over the posterior compartment had already incised the posterior compartment superficial fascia. An amputation knife then used to divide the posterior compartment.beveling the cut to the subcutaneous and skin incision distally. The distal leg and foot then removed from the OR field. Attention turned to the right open BKA surface and the anterior compartment vessels individually suture ligated twice to prevent  Bleeding as were the superficial and deep peroneal vessess. The peroneal nerves were placed under traction and divided and the vessels  suture ligated with 2-0 silk sutures. The posterior compartment examined and the posterior tibial vessels identified and suture ligated x 2. The tourniquet then released and bleed arterial vessels quickly clamped and suture ligated with 2-0 silk. The posterior compartment  thinned to allow for positioning the flap over the end of the cut portion of the end of the tibia and fibula. The end of the cut tibia then rasped as was the end of the fibula rounded. The posterior fascia of the posterior compartment then approximated to the anterior fascia of the anterior compartment and the periosteum overlying the anterior distal tibia. The two hemovac drains then placed and the remaining fascia approximated with #1 Vicryl sutures and the subcutaneous layers approximated with 2-0 vicryl. The posterior skin flap had a small amount of posterior central tissue deficit so that this was approximated and the stapled in the midline anteriorly and then the skin closed with stainless steel staples Dressing with adaptic, 4x4's and ABDs fixed with Kerlix 4 inch ace wrap applied. A knee immobilizer then placed. Patient then reactivated extubated returned to his bed in the recovery room in satisfactory condition all instrument and sponge counts were correct.  Specimens: Right distal leg and foot sent to pathology for diagnosis.       [name]  08/27/2020, 8:49 PM

## 2020-08-27 NOTE — Interval H&P Note (Signed)
History and Physical Interval Note:  08/27/2020 5:20 PM  Patrick [name]  has presented today for surgery, with the diagnosis of osteomyelitis right tibia and open ulcers right foot.  The various methods of treatment have been discussed with the patient and family. After consideration of risks, benefits and other options for treatment, the patient has consented to  Procedure(s): AMPUTATION BELOW KNEE (Right) as a surgical intervention.  The patient's history has been reviewed, patient examined, no change in status, stable for surgery.  I have reviewed the patient's chart and labs.  Questions were answered to the patient's satisfaction.     [name]

## 2020-08-27 NOTE — Progress Notes (Signed)
Patient ID: Patrick [name], male   DOB: 1965/03/10, 55 y.o.   MRN: [id number]   ORTHOPAEDIC CONSULTATION  REQUESTING PHYSICIAN: Florencia Reasons, MD  Chief Complaint: Abscess ulceration with foul-smelling drainage right foot.  HPI: Patrick [name] is a 55 y.o. male who presents with progressive abscess ulceration with foul-smelling drainage lateral aspect of the right foot.  Patient was last seen in my office about a month ago.  Recommended proceeding with a transtibial amputation at that time.  Patient states he has been seen at the [facility] wound center for wound treatment.  Prior to this time patient has had 3 surgical interventions for foot salvage intervention his last debridement was performed at [facility].  Patient has uncontrolled type 2 diabetes.  Past Medical History:  Diagnosis Date   Diabetes mellitus    Gout    History of COVID-19 04/2019   Hypertension    Past Surgical History:  Procedure Laterality Date   AMPUTATION Right 06/06/2020   Procedure: AMPUTATION 5TH RAY;  Surgeon: [name], MD;  Location: [place];  Service: Orthopedics;  Laterality: Right;   AMPUTATION Right 06/08/2020   Procedure: RIGHT 4TH RAY AMPUTATION;  Surgeon: [name], MD;  Location: [place];  Service: Orthopedics;  Laterality: Right;   Social History   Socioeconomic History   Marital status: Divorced    Spouse name: Not on file   Number of children: Not on file   Years of education: Not on file   Highest education level: Not on file  Occupational History   Not on file  Tobacco Use   Smoking status: Never   Smokeless tobacco: Never  Substance and Sexual Activity   Alcohol use: No   Drug use: No   Sexual activity: Not on file  Other Topics Concern   Not on file  Social History Narrative   Not on file   Social Determinants of Health   Financial Resource Strain: Not on file  Food Insecurity: Not on file  Transportation Needs: Not on file  Physical Activity: Not on file  Stress: Not on  file  Social Connections: Not on file   Family History  Problem Relation Age of Onset   Diabetes Neg Hx    - negative except otherwise stated in the family history section No Known Allergies Prior to Admission medications   Medication Sig Start Date End Date Taking? Authorizing Provider  acetaminophen (TYLENOL) 325 MG tablet Take 650 mg by mouth every 6 (six) hours as needed for headache, fever or moderate pain.   Yes [provider]  allopurinol (ZYLOPRIM) 100 MG tablet Take 1 tablet (100 mg total) by mouth 2 (two) times daily. 06/10/20  Yes Persons, Bevely Palmer, PA  amLODipine (NORVASC) 10 MG tablet Take 1 tablet (10 mg total) by mouth daily. 06/10/20  Yes Darliss Cheney, MD  blood glucose meter kit and supplies Dispense based on patient and insurance preference. Use up to four times daily as directed. (FOR ICD-10 E10.9, E11.9). 05/07/19  Yes Ghimire, Henreitta Leber, MD  gabapentin (NEURONTIN) 300 MG capsule Take 300 mg by mouth 3 (three) times daily. 03/17/20  Yes [provider]  indomethacin (INDOCIN) 25 MG capsule Take 50 mg by mouth 3 (three) times daily as needed for mild pain. 05/20/20  Yes [provider]  insulin glargine (LANTUS SOLOSTAR) 100 UNIT/ML Solostar Pen Inject 28-60 Units into the skin 2 (two) times daily. Inject 60 units in the morning and 28 units at bedtime  Patient taking differently: Inject 28-60 Units into the skin See admin instructions. Inject 60 units in the morning and 28 units at bedtime 06/10/20  Yes Pahwani, Ravi, MD  insulin lispro (HUMALOG) 200 UNIT/ML KwikPen Inject 10 Units into the skin 3 (three) times daily before meals. 06/14/20  Yes Danford, Suann Larry, MD  Insulin Pen Needle 32G X 8 MM MISC Use as directed 06/10/20  Yes Pahwani, Einar Grad, MD  losartan (COZAAR) 100 MG tablet Take 1 tablet (100 mg total) by mouth daily. 06/10/20 08/26/20 Yes Darliss Cheney, MD  metFORMIN (GLUCOPHAGE) 500 MG tablet Take 1 tablet (500 mg total) by mouth 2 (two) times  daily with a meal. 05/07/19  Yes Ghimire, Henreitta Leber, MD  metoprolol tartrate (LOPRESSOR) 25 MG tablet Take 1 tablet (25 mg total) by mouth 2 (two) times daily. 06/10/20 08/26/20 Yes Pahwani, Einar Grad, MD  oxyCODONE-acetaminophen (PERCOCET) 5-325 MG tablet Take 1 tablet by mouth every 4 (four) hours as needed. Patient taking differently: Take 1 tablet by mouth every 4 (four) hours as needed for moderate pain. 06/10/20 06/10/21 Yes Persons, Bevely Palmer, PA  sodium hypochlorite (DAKIN'S 1/2 STRENGTH) external solution Apply 1 application topically See admin instructions. Apply to gauze and apply to wound twice daily 08/17/20  Yes [provider]  tiZANidine (ZANAFLEX) 4 MG tablet Take 4 mg by mouth at bedtime. 04/17/19  Yes [provider]  pantoprazole (PROTONIX) 40 MG tablet Take 1 tablet (40 mg total) by mouth daily. Patient not taking: Reported on 08/26/2020 06/11/20 07/11/20  Darliss Cheney, MD  vancomycin (VANCOCIN) 125 MG capsule Take 125 mg by mouth 4 (four) times daily. Patient not taking: No sig reported 07/14/20   [provider]   DG Chest Port 1 View  Result Date: 08/26/2020 CLINICAL DATA:  Possible sepsis EXAM: PORTABLE CHEST 1 VIEW COMPARISON:  06/04/2020 FINDINGS: Mild cardiomegaly. No focal opacity or pleural effusion. No pneumothorax IMPRESSION: No active disease.  Mild cardiomegaly Electronically Signed   By: Donavan Foil M.D.   On: 08/26/2020 22:52   DG Foot Complete Right  Result Date: 08/26/2020 CLINICAL DATA:  Concern for foot infection EXAM: RIGHT FOOT COMPLETE - 3+ VIEW COMPARISON:  07/28/2020, 06/04/2020 FINDINGS: Patient is status post right fourth and fifth transmetatarsal amputation. Large irregular wound along the lateral aspect of the foot with gas in the soft tissues. Interval fragmentation and osseous destructive change of the fourth and fifth metatarsal remnant bases. Erosive change at the lateral cuboid. Increased periosteal reaction at the shaft of third  metatarsal. Advanced degenerative changes of the first MTP joint. IMPRESSION: 1. 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 Gas in the soft tissues is presumably related to the large wound. 2. Increased periosteal reaction along the shaft of third metatarsal question reactive versus due to infection though no gross osseous destructive change Electronically Signed   By: Donavan Foil M.D.   On: 08/26/2020 22:55   - pertinent xrays, CT, MRI studies were reviewed and independently interpreted  Positive ROS: All other systems have been reviewed and were otherwise negative with the exception of those mentioned in the HPI and as above.  Physical Exam: General: Alert, no acute distress Psychiatric: Patient is competent for consent with normal mood and affect Lymphatic: No axillary or cervical lymphadenopathy Cardiovascular: No pedal edema  Respiratory: No cyanosis, no use of accessory musculature GI: No organomegaly, abdomen is soft and non-tender    Images:  '@ENCIMAGES' @  Labs:  Lab Results  Component Value Date   HGBA1C >15.5 (H) 06/06/2020   HGBA1C >15.5 (H) 06/04/2020   HGBA1C 12.4 (H) 05/03/2019   ESRSEDRATE >140 (H) 08/26/2020   ESRSEDRATE 105 (H) 06/04/2020   CRP 18.3 (H) 08/26/2020   CRP 24.4 (H) 06/04/2020   CRP 1.2 (H) 05/07/2019   LABURIC 9.1 (H) 06/09/2020   REPTSTATUS PENDING 08/26/2020   GRAMSTAIN  06/06/2020    RARE WBC PRESENT, PREDOMINANTLY PMN ABUNDANT GRAM POSITIVE COCCI RARE GRAM POSITIVE RODS    CULT  08/26/2020    NO GROWTH < 12 HOURS Performed at [facility], [place] [address] [place], [place] [address]    LABORGA ENTEROCOCCUS FAECALIS 06/06/2020   LABORGA STAPHYLOCOCCUS AURICULARIS 06/06/2020    Lab Results  Component Value  Date   ALBUMIN 2.4 (L) 08/26/2020   ALBUMIN 2.1 (L) 06/07/2020   ALBUMIN 2.3 (L) 06/06/2020   PREALBUMIN 5.1 (L) 06/04/2020   LABURIC 9.1 (H) 06/09/2020     CBC EXTENDED Latest Ref Rng & Units 08/26/2020 06/10/2020 06/09/2020  WBC 4.0 - 10.5 K/uL 14.0(H) 17.8(H) 19.8(H)  RBC 4.22 - 5.81 MIL/uL 2.50(L) 3.08(L) 3.06(L)  HGB 13.0 - 17.0 g/dL 6.0(LL) 8.2(L) 8.2(L)  HCT 39.0 - 52.0 % 21.0(L) 26.5(L) 26.5(L)  PLT 150 - 400 K/uL 572(H) 533(H) 478(H)  NEUTROABS 1.7 - 7.7 K/uL 10.3(H) 14.1(H) 17.1(H)  LYMPHSABS 0.7 - 4.0 K/uL 2.2 1.9 1.1    Neurologic: Patient does not have protective sensation bilateral lower extremities.   MUSCULOSKELETAL:   Skin: Examination there is a large necrotic wound over the lateral aspect of the right foot with foul-smelling drainage exposed bone over the entire lateral aspect of his foot.  Patient has significant swelling in the right lower extremity with venous and lymphatic insufficiency.  Patient's ankle-brachial indices in April showed biphasic flow with adequate circulation to both lower extremities.  Patient's most recent white cell count is 14 with a hemoglobin of 6.  Albumin 2.4 and a hemoglobin A1c consistently greater than 15.5.  Radiographs shows osteomyelitis involving the third metatarsal status post fourth and fifth ray amputation with osteomyelitis involving the cuboid and base of the fourth and fifth metatarsals.  Assessment: Assessment: Uncontrolled type 2 diabetes with severe protein caloric malnutrition with osteomyelitis abscess and ulceration right foot.  Plan: I discussed with the patient the need to proceed with a transtibial amputation at this time.  Discussed the patient would need to be discharged to rehab at a skilled nursing facility.  Patient anticipates starting training with [facility] in August and I think this is reasonable.  I have discussed the patient's case with Dr. Louanne Skye and he will plan for proceeding with the transtibial  amputation.  I will order type and cross and transfusion for 2 units of packed red blood cells.  Thank you for the consult and the opportunity to see Mr. Army Melia, MD [facility] [phone] 9:27 AM

## 2020-08-27 NOTE — Brief Op Note (Signed)
08/27/2020  8:44 PM  PATIENT:  Patrick [name]  55 y.o. 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   DISPOSITION OF SPECIMEN:  PATHOLOGY  COUNTS:  YES  TOURNIQUET:   Total Tourniquet Time Documented: Thigh (Right) - 38 minutes Total: Thigh (Right) - 38 minutes   DICTATION: .Viviann Spare Dictation  PLAN OF CARE: Admit to inpatient   PATIENT DISPOSITION:  PACU - hemodynamically stable.   Delay start of Pharmacological VTE agent (>24hrs) due to surgical blood loss or risk of bleeding: yes

## 2020-08-28 ENCOUNTER — Encounter (HOSPITAL_COMMUNITY): Payer: Self-pay | Admitting: Specialist

## 2020-08-28 LAB — BASIC METABOLIC PANEL
Anion gap: 11 (ref 5–15)
BUN: 26 mg/dL — ABNORMAL HIGH (ref 6–20)
CO2: 16 mmol/L — ABNORMAL LOW (ref 22–32)
Calcium: 8.6 mg/dL — ABNORMAL LOW (ref 8.9–10.3)
Chloride: 108 mmol/L (ref 98–111)
Creatinine, Ser: 1.7 mg/dL — ABNORMAL HIGH (ref 0.61–1.24)
GFR, Estimated: 48 mL/min — ABNORMAL LOW (ref >60)
Glucose, Bld: 253 mg/dL — ABNORMAL HIGH (ref 70–99)
Potassium: 4.8 mmol/L (ref 3.5–5.1)
Sodium: 135 mmol/L (ref 135–145)

## 2020-08-28 LAB — GLUCOSE, CAPILLARY
Glucose-Capillary: 314 mg/dL — ABNORMAL HIGH (ref 70–99)
Glucose-Capillary: 306 mg/dL — ABNORMAL HIGH (ref 70–99)
Glucose-Capillary: 311 mg/dL — ABNORMAL HIGH (ref 70–99)
Glucose-Capillary: 284 mg/dL — ABNORMAL HIGH (ref 70–99)
Glucose-Capillary: 260 mg/dL — ABNORMAL HIGH (ref 70–99)

## 2020-08-28 LAB — CBC
HCT: 24 % — ABNORMAL LOW (ref 39.0–52.0)
Hemoglobin: 7.2 g/dL — ABNORMAL LOW (ref 13.0–17.0)
MCH: 24.9 pg — ABNORMAL LOW (ref 26.0–34.0)
MCHC: 30 g/dL (ref 30.0–36.0)
MCV: 83 fL (ref 80.0–100.0)
Platelets: 524 10*3/uL — ABNORMAL HIGH (ref 150–400)
RBC: 2.89 MIL/uL — ABNORMAL LOW (ref 4.22–5.81)
RDW: 17.1 % — ABNORMAL HIGH (ref 11.5–15.5)
WBC: 16.5 10*3/uL — ABNORMAL HIGH (ref 4.0–10.5)
nRBC: 0 % (ref 0.0–0.2)

## 2020-08-28 LAB — BLOOD CULTURE ID PANEL (REFLEXED) - BCID2

## 2020-08-28 MED ORDER — INSULIN ASPART 100 UNIT/ML IJ SOLN
3.0000 [IU] | Freq: Three times a day (TID) | INTRAMUSCULAR | Status: DC
  Administered 2020-08-28 – 2020-08-29 (×2): 3 [IU] via SUBCUTANEOUS

## 2020-08-28 MED ORDER — INSULIN ASPART 100 UNIT/ML IJ SOLN
0.0000 [IU] | Freq: Three times a day (TID) | INTRAMUSCULAR | Status: DC
Start: 2020-08-29 — End: 2020-08-28

## 2020-08-28 MED ORDER — INSULIN ASPART 100 UNIT/ML IJ SOLN
0.0000 [IU] | Freq: Three times a day (TID) | INTRAMUSCULAR | Status: DC
  Administered 2020-08-28: 11 [IU] via SUBCUTANEOUS
  Administered 2020-08-29: 7 [IU] via SUBCUTANEOUS
  Administered 2020-08-29 – 2020-08-31 (×4): 3 [IU] via SUBCUTANEOUS

## 2020-08-28 MED ORDER — VANCOMYCIN HCL 125 MG PO CAPS
125.0000 mg | ORAL_CAPSULE | Freq: Two times a day (BID) | ORAL | Status: DC
  Administered 2020-08-28 – 2020-09-01 (×9): 125 mg via ORAL
  Filled 2020-08-28 (×11): qty 1

## 2020-08-28 MED ORDER — METOPROLOL TARTRATE 12.5 MG HALF TABLET
12.5000 mg | ORAL_TABLET | Freq: Two times a day (BID) | ORAL | Status: DC
  Administered 2020-08-28 – 2020-09-01 (×8): 12.5 mg via ORAL
  Filled 2020-08-28 (×8): qty 1

## 2020-08-28 MED ORDER — INSULIN ASPART 100 UNIT/ML IJ SOLN
0.0000 [IU] | Freq: Every day | INTRAMUSCULAR | Status: DC
  Administered 2020-08-28: 3 [IU] via SUBCUTANEOUS

## 2020-08-28 MED ORDER — AMOXICILLIN 500 MG PO CAPS
500.0000 mg | ORAL_CAPSULE | Freq: Three times a day (TID) | ORAL | Status: DC
  Administered 2020-08-29 – 2020-09-01 (×11): 500 mg via ORAL
  Filled 2020-08-28 (×13): qty 1

## 2020-08-28 MED ORDER — INSULIN GLARGINE 100 UNIT/ML ~~LOC~~ SOLN
30.0000 [IU] | Freq: Two times a day (BID) | SUBCUTANEOUS | Status: DC
  Administered 2020-08-28 – 2020-08-30 (×5): 30 [IU] via SUBCUTANEOUS
  Filled 2020-08-28 (×8): qty 0.3

## 2020-08-28 MED ORDER — INSULIN ASPART 100 UNIT/ML IJ SOLN: 0.0000 [IU] | INTRAMUSCULAR | Status: DC

## 2020-08-28 NOTE — Progress Notes (Signed)
Inpatient Diabetes Program Recommendations  AACE/ADA: New Consensus Statement on Inpatient Glycemic Control (2015)  Target Ranges:  Prepandial:   less than 140 mg/dL      Peak postprandial:   less than 180 mg/dL (1-2 hours)      Critically ill patients:  140 - 180 mg/dL   Lab Results  Component Value Date   GLUCAP 306 (H) 08/28/2020   HGBA1C 7.8 (H) 08/27/2020    Review of Glycemic Control  Diabetes history: DM 2 Outpatient Diabetes medications: Lantus 60 units qam, 28 units qpm, Humalog 10 units tid, Metformin 500 mg bid Current orders for Inpatient glycemic control:  Lantus 20 units bid Novolog 0-15 units Q4 hours  Consult for lower extremity wound A1c >15.5% in 05/2020 DM Coordinator spoke with pt at that time. Pt just received new insurance and was able to continue to get their medications New A1c 7.8% this admission  Decadron 10 mg given yesterday. Trends upt o 300 now.   - Increase Lantus to 25 units bid - Add Novolog 5 units tid meal coverage if eating >50% of meals  Thanks,  Tama Headings RN, MSN, BC-ADM Inpatient Diabetes Coordinator Team Pager [phone] (8a-5p)

## 2020-08-28 NOTE — Progress Notes (Signed)
     Subjective: 1 Day Post-Op Procedure(s) (LRB): AMPUTATION BELOW KNEE (Right) Reports that he felt like the right foot was there and nearly went ot walk on the leg. I spoke with him about phantom pain and risk of accidentally trying to walk on a leg that isn't there causing falls and  Dehiscence of the wound. He understands. PT and OT by to start to mobilize. No chills, On ceftriaxone, Dressing dry, hemovac in place, with decreasing drainage. I will leave hemovac and discontinue in the AM Monday.  Patient reports pain as moderate.    Objective:   VITALS:  Temp:  [97.1 F (36.2 C)-98.6 F (37 C)] 97.6 F (36.4 C) (07/10 0719) Pulse Rate:  [66-117] 66 (07/10 0719) Resp:  [12-28] 17 (07/10 0719) BP: (107-155)/(78-104) 136/78 (07/10 0719) SpO2:  [93 %-100 %] 99 % (07/10 0719) Weight:  [142.6 kg] 142.6 kg (07/09 2130)  Neurologically intact ABD soft Neurovascular intact Sensation intact distally Incision: dressing C/D/I and Dressing and knee immobilizer intact Compartment soft   LABS Recent Labs    08/26/20 2325 08/27/20 1024 08/27/20 1955 08/28/20 0051  HGB 6.0* 8.3* 7.5* 7.2*  WBC 14.0* 14.4*  --  16.5*  PLT 572* 588*  --  524*   Recent Labs    08/27/20 1024 08/27/20 1955 08/28/20 0051  NA 136 138 135  K 3.8 4.4 4.8  CL 108  --  108  CO2 18*  --  16*  BUN 30*  --  26*  CREATININE 1.96*  --  1.70*  GLUCOSE 143*  --  253*   Recent Labs    08/26/20 2312  INR 1.3*     Assessment/Plan: 1 Day Post-Op Procedure(s) (LRB): AMPUTATION BELOW KNEE (Right)  Advance diet Up with therapy Will have CIR evaluate for consideration of inpatient rehab post right BKA.   [name] 08/28/2020, 7:50 AM Patient ID: Patrick [name], male   DOB: January 13, 1966, 55 y.o.   MRN: [id number]

## 2020-08-28 NOTE — Evaluation (Signed)
Occupational Therapy Evaluation Patient Details Name: Patrick [name] MRN: [id number] DOB: 1965-03-03 Today's Date: 08/28/2020    History of Present Illness 55 yo admitted 7/8 with sepsis due to Rt foot. 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 PMHx: DM, HTN, gout, Rth 4th and 5th ray amputations   Clinical Impression   PT admitted for concerns listed above. PTA pt reported that he was independent with all ADL's and IADL's, using no DME. Pt requiring min guard to min A for ADL's and functional mobility. At this time pt is unable to stand and maintain balance without max A, however he is able to complete all lateral scoots to different surfaces without physical assistance, only min guard for safety. Pt will benefit from intensive rehab to maximize pt's independence prior to returning home. Acute OT will follow to address concerns listed below.     Follow Up Recommendations  CIR    Equipment Recommendations  Other (comment) (TBD)    Recommendations for Other Services Rehab consult     Precautions / Restrictions Precautions Precautions: Fall Precaution Comments: hemovac Required Braces or Orthoses: Knee Immobilizer - Right Restrictions Weight Bearing Restrictions: Yes RLE Weight Bearing: Non weight bearing      Mobility Bed Mobility Overal bed mobility: Modified Independent                  Transfers Overall transfer level: Needs assistance   Transfers: Lateral/Scoot Transfers          Lateral/Scoot Transfers: Min guard General transfer comment: Min guard for lateral scoots to drop arm surfaces    Balance Overall balance assessment: No apparent balance deficits (not formally assessed) Sitting-balance support: No upper extremity supported Sitting balance-Leahy Scale: Good                                     ADL either performed or assessed with clinical judgement   ADL Overall ADL's : Needs assistance/impaired Eating/Feeding: Independent;Sitting   Grooming:  Independent;Sitting   Upper Body Bathing: Min guard;Sitting   Lower Body Bathing: Minimal assistance;Sitting/lateral leans   Upper Body Dressing : Independent;Sitting   Lower Body Dressing: Minimal assistance;Sitting/lateral leans   Toilet Transfer: Counselling psychologist and Hygiene: Min guard;Sitting/lateral lean   Tub/ Shower Transfer: Orthoptist mobility during ADLs: Min guard General ADL Comments: Pt requiring min guard to min A with most ADL's while sitting and leaning. Pt unable to stand at this time without max A.     Vision Baseline Vision/History: No visual deficits Patient Visual Report: No change from baseline Vision Assessment?: No apparent visual deficits     Perception Perception Perception Tested?: No   Praxis Praxis Praxis tested?: Not tested    Pertinent Vitals/Pain Pain Assessment: No/denies pain     Hand Dominance Right   Extremity/Trunk Assessment Upper Extremity Assessment Upper Extremity Assessment: Overall WFL for tasks assessed   Lower Extremity Assessment Lower Extremity Assessment: Defer to PT evaluation   Cervical / Trunk Assessment Cervical / Trunk Assessment: Normal   Communication Communication Communication: No difficulties   Cognition Arousal/Alertness: Awake/alert Behavior During Therapy: WFL for tasks assessed/performed Overall Cognitive Status: Within Functional Limits for tasks assessed  General Comments  VSS on RA    Exercises Exercises: Other exercises Other Exercises Other Exercises: Chair pushups x15 reps   Shoulder Instructions      Home Living Family/patient expects to be discharged to:: Private residence Living Arrangements: Non-relatives/Friends Available Help at Discharge: Friend(s);Available 24 hours/day Type of Home: House Home Access: Level entry     Home Layout: Bed/bath upstairs;1/2  bath on main level     Bathroom Shower/Tub: Teacher, early years/pre: Standard     Home Equipment: Crutches   Additional Comments: couch on main floor      Prior Functioning/Environment Level of Independence: Independent with assistive device(s)        Comments: using crutches since toe amputations but caring for himself        OT Problem List: Decreased strength;Decreased activity tolerance;Impaired balance (sitting and/or standing);Decreased knowledge of use of DME or AE;Decreased safety awareness;Obesity;Pain      OT Treatment/Interventions: Self-care/ADL training;Therapeutic exercise;Energy conservation;DME and/or AE instruction;Therapeutic activities;Patient/family education;Balance training    OT Goals(Current goals can be found in the care plan section) Acute Rehab OT Goals Patient Stated Goal: fish, walk with a prosthesis OT Goal Formulation: With patient Time For Goal Achievement: 09/11/20 Potential to Achieve Goals: Good ADL Goals Pt Will Perform Lower Body Bathing: with modified independence;with adaptive equipment;sitting/lateral leans Pt Will Perform Lower Body Dressing: with modified independence;with adaptive equipment;sitting/lateral leans Pt Will Transfer to Toilet: with modified independence;with transfer board Pt Will Perform Toileting - Clothing Manipulation and hygiene: with modified independence;sitting/lateral leans  OT Frequency: Min 2X/week   Barriers to D/C:            Co-evaluation              AM-PAC OT "6 Clicks" Daily Activity     Outcome Measure Help from another person eating meals?: None Help from another person taking care of personal grooming?: None Help from another person toileting, which includes using toliet, bedpan, or urinal?: A Little Help from another person bathing (including washing, rinsing, drying)?: A Little Help from another person to put on and taking off regular upper body clothing?: A Little Help  from another person to put on and taking off regular lower body clothing?: A Little 6 Click Score: 20   End of Session Equipment Utilized During Treatment: Right knee immobilizer Nurse Communication: Mobility status  Activity Tolerance: Patient tolerated treatment well Patient left: in chair;with call bell/phone within reach  OT Visit Diagnosis: Unsteadiness on feet (R26.81);Other abnormalities of gait and mobility (R26.89);Muscle weakness (generalized) (M62.81)                Time: [id number] OT Time Calculation (min): 17 min Charges:  OT General Charges $OT Visit: 1 Visit OT Evaluation $OT Eval Moderate Complexity: Hobbs., OTR/L Acute Rehabilitation  Julizza Sassone Elane Yolanda Bonine 08/28/2020, 11:59 PM

## 2020-08-28 NOTE — Progress Notes (Signed)
PHARMACY - PHYSICIAN COMMUNICATION CRITICAL VALUE ALERT - BLOOD CULTURE IDENTIFICATION (BCID)  Patrick [name] is an 55 y.o. male who presented to [facility] on 08/26/2020 with a chief complaint of osteomyelitis  Assessment:  1/3 BC with Staph species Likely contaminant  Name of physician (or Provider) Contacted: Hal Hope  Current antibiotics: rocephin  Changes to prescribed antibiotics recommended:  None  Results for orders placed or performed during the hospital encounter of 08/26/20  Blood Culture ID Panel (Reflexed) (Collected: 08/26/2020 10:05 PM)  Result Value Ref Range   Enterococcus faecalis NOT DETECTED NOT DETECTED   Enterococcus Faecium NOT DETECTED NOT DETECTED   Listeria monocytogenes NOT DETECTED NOT DETECTED   Staphylococcus species DETECTED (A) NOT DETECTED   Staphylococcus aureus (BCID) NOT DETECTED NOT DETECTED   Staphylococcus epidermidis NOT DETECTED NOT DETECTED   Staphylococcus lugdunensis NOT DETECTED NOT DETECTED   Streptococcus species NOT DETECTED NOT DETECTED   Streptococcus agalactiae NOT DETECTED NOT DETECTED   Streptococcus pneumoniae NOT DETECTED NOT DETECTED   Streptococcus pyogenes NOT DETECTED NOT DETECTED   A.calcoaceticus-baumannii NOT DETECTED NOT DETECTED   Bacteroides fragilis NOT DETECTED NOT DETECTED   Enterobacterales NOT DETECTED NOT DETECTED   Enterobacter cloacae complex NOT DETECTED NOT DETECTED   Escherichia coli NOT DETECTED NOT DETECTED   Klebsiella aerogenes NOT DETECTED NOT DETECTED   Klebsiella oxytoca NOT DETECTED NOT DETECTED   Klebsiella pneumoniae NOT DETECTED NOT DETECTED   Proteus species NOT DETECTED NOT DETECTED   Salmonella species NOT DETECTED NOT DETECTED   Serratia marcescens NOT DETECTED NOT DETECTED   Haemophilus influenzae NOT DETECTED NOT DETECTED   Neisseria meningitidis NOT DETECTED NOT DETECTED   Pseudomonas aeruginosa NOT DETECTED NOT DETECTED   Stenotrophomonas maltophilia NOT DETECTED NOT DETECTED    Candida albicans NOT DETECTED NOT DETECTED   Candida auris NOT DETECTED NOT DETECTED   Candida glabrata NOT DETECTED NOT DETECTED   Candida krusei NOT DETECTED NOT DETECTED   Candida parapsilosis NOT DETECTED NOT DETECTED   Candida tropicalis NOT DETECTED NOT DETECTED   Cryptococcus neoformans/gattii NOT DETECTED NOT DETECTED    [name] 08/28/2020  6:07 AM

## 2020-08-28 NOTE — Evaluation (Signed)
Physical Therapy Evaluation Patient Details Name: Patrick [name] MRN: [id number] DOB: 1966-01-02 Today's Date: 08/28/2020   History of Present Illness  55 yo admitted 7/8 with sepsis due to Rt foot. 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 Pt reports history of playing college foot ball and being a softball umpire but currently enjoys fishing. Pt lives with long time friends in 2 story house with bedroom upstairs. Pt able to perform bed mobility and lateral transfers well but unable to achieve standing this date. Pt with decreased strength, function, transfers and safety who will benefit from acute therapy to maximize mobility, safety and function to decrease burden of care.     Follow Up Recommendations CIR    Equipment Recommendations  Wheelchair (measurements PT);Wheelchair cushion (measurements PT);Rolling walker with 5" wheels;3in1 (PT)    Recommendations for Other Services Rehab consult;OT consult     Precautions / Restrictions Precautions Precautions: Fall Precaution Comments: hemovac Required Braces or Orthoses: Knee Immobilizer - Right      Mobility  Bed Mobility Overal bed mobility: Modified Independent             General bed mobility comments: pt able to roll to left with bed flat and transition to sitting without assist with increased time    Transfers Overall transfer level: Needs assistance   Transfers: Sit to/from Stand;Lateral/Scoot Transfers Sit to Stand: Mod assist;From elevated surface        Lateral/Scoot Transfers: Min assist General transfer comment: mod assist from elevated surface with left knee blocked to attempt to stand. Pt with partial stand but when attempting to shift hand from bed to RW forgot he didn't have RLE and tried to put weight on that side with return to sitting. Deferred further standing attempt without +2 assist. Lateral scoot from bed to  drop arm recliner with min assist and cues for sequence as well as assist for setup  Ambulation/Gait             General Gait Details: unable  Stairs            Wheelchair Mobility    Modified Rankin (Stroke Patients Only)       Balance Overall balance assessment: Needs assistance Sitting-balance support: No upper extremity supported Sitting balance-Leahy Scale: Fair                                       Pertinent Vitals/Pain Pain Assessment: No/denies pain    Home Living Family/patient expects to be discharged to:: Private residence Living Arrangements: Non-relatives/Friends Available Help at Discharge: Friend(s);Available 24 hours/day Type of Home: House Home Access: Level entry     Home Layout: Bed/bath upstairs;1/2 bath on main level Home Equipment: Crutches Additional Comments: couch on main floor    Prior Function Level of Independence: Independent with assistive device(s)         Comments: using crutches since toe amputations but caring for himself     Hand Dominance        Extremity/Trunk Assessment   Upper Extremity Assessment Upper Extremity Assessment: Overall WFL for tasks assessed    Lower Extremity Assessment Lower Extremity Assessment: RLE deficits/detail RLE Deficits / Details: BKA with decreased awareness with transfers    Cervical / Trunk Assessment Cervical / Trunk Assessment: Normal  Communication   Communication:  No difficulties  Cognition Arousal/Alertness: Awake/alert Behavior During Therapy: WFL for tasks assessed/performed Overall Cognitive Status: Within Functional Limits for tasks assessed                                        General Comments      Exercises Amputee Exercises Hip Extension: AROM;Right;Sidelying;10 reps Hip ABduction/ADduction: AROM;Right;Supine;10 reps Straight Leg Raises: AROM;Right;Supine;10 reps   Assessment/Plan    PT Assessment Patient needs  continued PT services  PT Problem List Decreased mobility;Decreased coordination;Decreased activity tolerance;Decreased balance;Decreased knowledge of use of DME;Decreased skin integrity       PT Treatment Interventions Functional mobility training;Therapeutic activities;Stair training;Balance training;Gait training;DME instruction;Therapeutic exercise;Patient/family education    PT Goals (Current goals can be found in the Care Plan section)  Acute Rehab PT Goals Patient Stated Goal: fish, walk with a prosthesis PT Goal Formulation: With patient Time For Goal Achievement: 09/11/20 Potential to Achieve Goals: Good    Frequency Min 4X/week   Barriers to discharge        Co-evaluation               AM-PAC PT "6 Clicks" Mobility  Outcome Measure Help needed turning from your back to your side while in a flat bed without using bedrails?: A Little Help needed moving from lying on your back to sitting on the side of a flat bed without using bedrails?: A Little Help needed moving to and from a bed to a chair (including a wheelchair)?: A Little Help needed standing up from a chair using your arms (e.g., wheelchair or bedside chair)?: A Lot Help needed to walk in hospital room?: Total Help needed climbing 3-5 steps with a railing? : Total 6 Click Score: 13    End of Session   Activity Tolerance: Patient tolerated treatment well Patient left: in chair;with call bell/phone within reach;with chair alarm set Nurse Communication: Mobility status;Precautions;Other (comment) (lateral transfers with drop arm) PT Visit Diagnosis: Other abnormalities of gait and mobility (R26.89);Difficulty in walking, not elsewhere classified (R26.2)    Time: [id number] PT Time Calculation (min) (ACUTE ONLY): 26 min   Charges:   PT Evaluation $PT Eval Moderate Complexity: 1 Mod PT Treatments $Therapeutic Activity: 8-22 mins        Adriella Essex P, PT Acute Rehabilitation Services Pager:  [phone] Office: [phone]   Zuriah Bordas B Geary Rufo 08/28/2020, 11:02 AM

## 2020-08-28 NOTE — Progress Notes (Signed)
PROGRESS NOTE    Patrick [name]  [id number] DOB: 1965-09-10 DOA: 08/26/2020 PCP: [facility]    Chief Complaint  Patient presents with   Wound Infection    Right Foot    Brief Narrative:  History of hypertension, insulin-dependent type 2 diabetes, recent history of C. difficile ,present with right diabetic foot infection, found to have sepsis, acute anemia  Subjective:  POD#1, hemovac in place, he is sitting up in chair, reports pain is tolerable, family at bedside   Blood glucose elevated  Assessment & Plan:   Principal Problem:   Sepsis due to cellulitis [facility]) Active Problems:   Insulin-requiring or dependent type II diabetes mellitus ([place])   HYPERTENSION, BENIGN ESSENTIAL   Diabetic foot ulcer ([place])   Diabetic foot infection ([place])   CKD (chronic kidney disease) stage 3, GFR 30-59 ml/min (HCC)   Osteomyelitis of right foot ([place])   Anemia   Sepsis present on admission/Osteomyelitis, abscess, ulceration /right diabetic foot -With tachycardia, tachypnea, low temp 100.1, WBC 14, right foot infection -He is started on vanc, Rocephin, Flagyl on admission -Blood culture obtained from ED 1/4 grew staph -Urine culture +1000 colonies of enterococcus faecium -past wound culture from 05/2020 + staph and enterococcus -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, ortho Dr Louanne Skye input appreciated  -per ortho communication with pharmacy need  abx coverage for possible cellulitis due to "There was a zone of edema and soft tissue sign of reactive edema so treat like cellulitis" -case discussed with pharmacy, will d/c vanc/cefepime/flagyl, abx changed to ampicillin for 5 days -F/u on final culture result   Acute on chronic normocytic anemia -hgb in 2021 was around 11-12 -hgb this year around 8-9 -hgb on presentation was 6 -Received 3 units PRBC -s/p right bka has  hemovac on  FOBT + Per RN stool is brown with streaks of blood He denies ab pain, no n/v Recent h/o cdiff, still has mushy stool  per patient, no bm today Will need GI consult during this hospitalization or close gi follow up after discharge pending on hgb trend  Recent history of C. Difficile in 06/2020 Per chart review, he was treated with oral vanc Reports stool is mushy, c diff ordered on admission, he has one bm in the ED on 7/9, has not had bm today on 7/10 Started on prophylaxis dose oral vanc '125mg'$  bid , increase to treatment dose if c diff positive  Insulin-dependent type 2 diabetes, uncontrolled with hyperglycemia -A1c at 7.8% , is likely falsely low due to acute anemia and AKI -increase lantus,  increase ssi scale to resistant scale, add meal coverage, continue to adjust insulin   AKI on CKDIIIb Cr appear at baseline and improved , cr down from 2.27 to 1.7 Renal dosing meds   Nutritional Assessment: The patient's BMI is: Body mass index is 38.27 kg/m.Marland Kitchen Seen by dietician.  I agree with the assessment and plan as outlined below:  Nutrition Status: Nutrition Problem: Increased nutrient needs Etiology: wound healing Signs/Symptoms: estimated needs Interventions: MVI, Ensure Enlive (each supplement provides 350kcal and 20 grams of protein)  .       Unresulted Labs (From admission, onward)     Start     Ordered   09/03/20 0500  Creatinine, serum  (enoxaparin (LOVENOX)    CrCl >/= 30 ml/min)  Weekly,   R     Comments: while on enoxaparin therapy    08/27/20 2341   08/29/20 0500  Hepatic function panel  Tomorrow morning,  R        08/28/20 0941   08/28/20 [id number]  Basic metabolic panel  Daily,   R      08/27/20 2341   08/28/20 0500  CBC  Daily,   R      08/27/20 2341   08/27/20 1249  C Difficile Quick Screen w PCR reflex  (C Difficile quick screen w PCR reflex panel )  Once, for 24 hours,   STAT       References:    CDiff Information Tool   08/27/20 1248   08/26/20 2325  CBC with Differential  ONCE - STAT,   STAT        08/26/20 2324   08/26/20 2146  CBC WITH DIFFERENTIAL  (Septic presentation  on arrival (screening labs, nursing and treatment orders for obvious sepsis))  ONCE - STAT,   STAT        08/26/20 2145   08/26/20 2146  Urine culture  (Septic presentation on arrival (screening labs, nursing and treatment orders for obvious sepsis))  ONCE - STAT,   STAT        08/26/20 2145              DVT prophylaxis: enoxaparin (LOVENOX) injection 40 mg Start: 08/28/20 0800 SCD's Start: 08/27/20 2341 SCDs Start: 08/27/20 0306   Code Status:full Family Communication: Significant other at bedside with permission Disposition:   Status is: Inpatient   Dispo: The patient is from: Home              Anticipated d/c is to: CIR              Anticipated d/c date is: CIR once cleared Ortho clearance                Consultants:  Orthopedics  Procedures:  S/p right BKA  Antimicrobials:   Anti-infectives (From admission, onward)    Start     Dose/Rate Route Frequency Ordered Stop   08/28/20 0000  vancomycin (VANCOREADY) IVPB 1500 mg/300 mL        1,500 mg 150 mL/hr over 120 Minutes Intravenous Every 24 hours 08/27/20 0320     08/27/20 0600  cefTRIAXone (ROCEPHIN) 2 g in sodium chloride 0.9 % 100 mL IVPB        2 g 200 mL/hr over 30 Minutes Intravenous Every 24 hours 08/27/20 0256     08/27/20 0400  metroNIDAZOLE (FLAGYL) IVPB 500 mg        500 mg 100 mL/hr over 60 Minutes Intravenous Every 8 hours 08/27/20 0256     08/26/20 2330  vancomycin (VANCOCIN) 2,500 mg in sodium chloride 0.9 % 500 mL IVPB        2,500 mg 250 mL/hr over 120 Minutes Intravenous  Once 08/26/20 2321 08/27/20 0415   08/26/20 2330  cefTRIAXone (ROCEPHIN) 2 g in sodium chloride 0.9 % 100 mL IVPB        2 g 200 mL/hr over 30 Minutes Intravenous  Once 08/26/20 2321 08/27/20 0120           Objective: Vitals:   08/27/20 2105 08/27/20 2130 08/28/20 0534 08/28/20 0719  BP: (!) 151/92 (!) 148/95 (!) 154/92 136/78  Pulse: 93 94  66  Resp: '17 20  17  '$ Temp: 97.8 F (36.6 C) 97.7 F (36.5 C) 98.5 F  (36.9 C) 97.6 F (36.4 C)  TempSrc:  Oral Oral Oral  SpO2: 100% 93%  99%  Weight:  (!) 142.6 kg  Height:  '6\' 4"'$  (1.93 m)      Intake/Output Summary (Last 24 hours) at 08/28/2020 0942 Last data filed at 08/28/2020 0800 Gross per 24 hour  Intake 3255 ml  Output 200 ml  Net 3055 ml   Filed Weights   08/26/20 2142 08/27/20 2130  Weight: 131.5 kg (!) 142.6 kg    Examination:  General exam: calm, NAD Respiratory system: Clear to auscultation. Respiratory effort normal. Cardiovascular system: S1 & S2 heard, sinus tachycardia has resolved  Gastrointestinal system: Abdomen is nondistended, soft and nontender.   normal bowel sounds heard. Central nervous system: Alert and oriented. No focal neurological deficits. Extremities: s/p right BKA, + hemovac Skin: chronic venous stasis changed bilateral lower extremities Psychiatry: Judgement and insight appear normal. Mood & affect appropriate.     Data Reviewed: I have personally reviewed following labs and imaging studies  CBC: Recent Labs  Lab 08/26/20 2325 08/27/20 1024 08/27/20 1955 08/28/20 0051  WBC 14.0* 14.4*  --  16.5*  NEUTROABS 10.3*  --   --   --   HGB 6.0* 8.3* 7.5* 7.2*  HCT 21.0* 27.3* 22.0* 24.0*  MCV 84.0 82.2  --  83.0  PLT 572* 588*  --  524*    Basic Metabolic Panel: Recent Labs  Lab 08/26/20 2205 08/27/20 1024 08/27/20 1955 08/28/20 0051  NA 134* 136 138 135  K 4.4 3.8 4.4 4.8  CL 106 108  --  108  CO2 18* 18*  --  16*  GLUCOSE 204* 143*  --  253*  BUN 32* 30*  --  26*  CREATININE 2.27* 1.96*  --  1.70*  CALCIUM 8.7* 9.0  --  8.6*    GFR: Estimated Creatinine Clearance: 77.5 mL/min (A) (by C-G formula based on SCr of 1.7 mg/dL (H)).  Liver Function Tests: Recent Labs  Lab 08/26/20 2205  AST 59*  ALT 81*  ALKPHOS 184*  BILITOT 0.7  PROT 7.1  ALBUMIN 2.4*    CBG: Recent Labs  Lab 08/27/20 1131 08/27/20 2036 08/27/20 2222 08/28/20 0539 08/28/20 0718  GLUCAP 140* 156* 180*  314* 306*     Recent Results (from the past 240 hour(s))  Resp Panel by RT-PCR (Flu A&B, Covid) Nasopharyngeal Swab     Status: None   Collection Time: 08/26/20  9:46 PM   Specimen: Nasopharyngeal Swab; Nasopharyngeal(NP) swabs in vial transport medium  Result Value Ref Range Status   SARS Coronavirus 2 by RT PCR NEGATIVE NEGATIVE Final    Comment: (NOTE) SARS-CoV-2 target nucleic acids are NOT DETECTED.  The SARS-CoV-2 RNA is generally detectable in upper respiratory specimens during the acute phase of infection. The lowest concentration of SARS-CoV-2 viral copies this assay can detect is 138 copies/mL. A negative result does not preclude SARS-Cov-2 infection and should not be used as the sole basis for treatment or other patient management decisions. A negative result may occur with  improper specimen collection/handling, submission of specimen other than nasopharyngeal swab, presence of viral mutation(s) within the areas targeted by this assay, and inadequate number of viral copies(<138 copies/mL). A negative result must be combined with clinical observations, patient history, and epidemiological information. The expected result is Negative.  Fact Sheet for Patients:  EntrepreneurPulse.com.au  Fact Sheet for Healthcare Providers:  IncredibleEmployment.be  This test is no t yet approved or cleared by the [place] FDA and  has been authorized for detection and/or diagnosis of SARS-CoV-2 by FDA under an Emergency Use Authorization (EUA). This EUA will remain  in effect (meaning this test can be used) for the duration of the COVID-19 declaration under Section 564(b)(1) of the Act, 21 U.S.C.section 360bbb-3(b)(1), unless the authorization is terminated  or revoked sooner.       Influenza A by PCR NEGATIVE NEGATIVE Final   Influenza B by PCR NEGATIVE NEGATIVE Final    Comment: (NOTE) The Xpert Xpress SARS-CoV-2/FLU/RSV plus assay is  intended as an aid in the diagnosis of influenza from Nasopharyngeal swab specimens and should not be used as a sole basis for treatment. Nasal washings and aspirates are unacceptable for Xpert Xpress SARS-CoV-2/FLU/RSV testing.  Fact Sheet for Patients: EntrepreneurPulse.com.au  Fact Sheet for Healthcare Providers: IncredibleEmployment.be  This test is not yet approved or cleared by the [place] FDA and has been authorized for detection and/or diagnosis of SARS-CoV-2 by FDA under an Emergency Use Authorization (EUA). This EUA will remain in effect (meaning this test can be used) for the duration of the COVID-19 declaration under Section 564(b)(1) of the Act, 21 U.S.C. section 360bbb-3(b)(1), unless the authorization is terminated or revoked.  Performed at [facility], [place] [address] [place], [place] [address]   Blood Culture (routine x 2)     Status: None (Preliminary result)   Collection Time: 08/26/20 10:05 PM   Specimen: BLOOD RIGHT ARM  Result Value Ref Range Status   Specimen Description BLOOD RIGHT ARM  Final   Special Requests   Final    BOTTLES DRAWN AEROBIC AND ANAEROBIC Blood Culture adequate volume   Culture  Setup Time   Final    GRAM POSITIVE COCCI IN CLUSTERS AEROBIC BOTTLE ONLY CRITICAL RESULT CALLED TO, READ BACK BY AND VERIFIED WITH: [place] BY MESSAN H. AT [id number] ON 7  10 2022 Performed at [facility], [place] [address] [place], [place] [address]    Culture GRAM POSITIVE COCCI  Final   Report Status PENDING  Incomplete  Blood Culture ID Panel (Reflexed)     Status: Abnormal   Collection Time: 08/26/20 10:05 PM  Result Value Ref Range Status   Enterococcus faecalis NOT DETECTED NOT DETECTED Final   Enterococcus Faecium NOT DETECTED NOT DETECTED Final   Listeria monocytogenes NOT DETECTED NOT DETECTED Final   Staphylococcus species DETECTED (A) NOT DETECTED Final    Comment: CRITICAL RESULT  CALLED TO, READ BACK BY AND VERIFIED WITH: PHARMD LAURA SEAY BY MESSAN H. AT [id number] ON 7  10 2022    Staphylococcus aureus (BCID) NOT DETECTED NOT DETECTED Final   Staphylococcus epidermidis NOT DETECTED NOT DETECTED Final   Staphylococcus lugdunensis NOT DETECTED NOT DETECTED Final   Streptococcus species NOT DETECTED NOT DETECTED Final   Streptococcus agalactiae NOT DETECTED NOT DETECTED Final   Streptococcus pneumoniae NOT DETECTED NOT DETECTED Final   Streptococcus pyogenes NOT DETECTED NOT DETECTED Final   A.calcoaceticus-baumannii NOT DETECTED NOT DETECTED Final   Bacteroides fragilis NOT DETECTED NOT DETECTED Final   Enterobacterales NOT DETECTED NOT DETECTED Final   Enterobacter cloacae complex NOT DETECTED NOT DETECTED Final   Escherichia coli NOT DETECTED NOT DETECTED Final   Klebsiella aerogenes NOT DETECTED NOT DETECTED Final   Klebsiella oxytoca NOT DETECTED NOT DETECTED Final   Klebsiella pneumoniae NOT DETECTED NOT DETECTED Final   Proteus species NOT DETECTED NOT DETECTED Final   Salmonella species NOT DETECTED NOT DETECTED Final   Serratia marcescens NOT DETECTED NOT DETECTED Final   Haemophilus influenzae NOT DETECTED NOT DETECTED Final   Neisseria meningitidis NOT DETECTED NOT  DETECTED Final   Pseudomonas aeruginosa NOT DETECTED NOT DETECTED Final   Stenotrophomonas maltophilia NOT DETECTED NOT DETECTED Final   Candida albicans NOT DETECTED NOT DETECTED Final   Candida auris NOT DETECTED NOT DETECTED Final   Candida glabrata NOT DETECTED NOT DETECTED Final   Candida krusei NOT DETECTED NOT DETECTED Final   Candida parapsilosis NOT DETECTED NOT DETECTED Final   Candida tropicalis NOT DETECTED NOT DETECTED Final   Cryptococcus neoformans/gattii NOT DETECTED NOT DETECTED Final    Comment: Performed at [facility], [place] [address] [place], [place] [address]  Blood Culture (routine x 2)     Status: None (Preliminary result)   Collection Time: 08/26/20 10:06 PM    Specimen: BLOOD LEFT ARM  Result Value Ref Range Status   Specimen Description BLOOD LEFT ARM  Final   Special Requests   Final    BOTTLES DRAWN AEROBIC ONLY Blood Culture adequate volume   Culture   Final    NO GROWTH 2 DAYS Performed at [facility], [place] [address] [place], [place] [address]    Report Status PENDING  Incomplete         Radiology Studies: DG Chest Port 1 View  Result Date: 08/26/2020 CLINICAL DATA:  Possible sepsis EXAM: PORTABLE CHEST 1 VIEW COMPARISON:  06/04/2020 FINDINGS: Mild cardiomegaly. No focal opacity or pleural effusion. No pneumothorax IMPRESSION: No active disease.  Mild cardiomegaly Electronically Signed   By: Donavan Foil M.D.   On: 08/26/2020 22:52   DG Foot Complete Right  Result Date: 08/26/2020 CLINICAL DATA:  Concern for foot infection EXAM: RIGHT FOOT COMPLETE - 3+ VIEW COMPARISON:  07/28/2020, 06/04/2020 FINDINGS: Patient is status post right fourth and fifth transmetatarsal amputation. Large irregular wound along the lateral aspect of the foot with gas in the soft tissues. Interval fragmentation and osseous destructive change of the fourth and fifth metatarsal remnant bases. Erosive change at the lateral cuboid. Increased periosteal reaction at the shaft of third metatarsal. Advanced degenerative changes of the first MTP joint. IMPRESSION: 1. 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 Gas in the soft tissues is presumably related to the large wound. 2. Increased periosteal reaction along the shaft of third metatarsal question reactive versus due to infection though no gross osseous destructive change Electronically Signed   By: Donavan Foil M.D.   On: 08/26/2020 22:55        Scheduled Meds:  sodium chloride    Intravenous Once   acetaminophen  1,000 mg Oral Q6H   allopurinol  100 mg Oral BID   vitamin C  1,000 mg Oral Daily   docusate sodium  100 mg Oral Daily   enoxaparin (LOVENOX) injection  40 mg Subcutaneous Q24H   ferrous sulfate  325 mg Oral TID PC   gabapentin  300 mg Oral TID   insulin aspart  0-15 Units Subcutaneous Q4H   insulin glargine  20 Units Subcutaneous BID   metoprolol tartrate  2.5 mg Intravenous Q8H   multivitamin with minerals  1 tablet Oral Daily   pantoprazole  40 mg Oral Daily   Ensure Max Protein  11 oz Oral BID   tiZANidine  4 mg Oral QHS   zinc sulfate  220 mg Oral Daily   Continuous Infusions:  sodium  chloride 100 mL/hr at 08/28/20 0056   cefTRIAXone (ROCEPHIN)  IV 2 g (08/28/20 0528)   magnesium sulfate bolus IVPB     metronidazole 500 mg (08/28/20 0316)   vancomycin 1,500 mg (08/27/20 2320)     LOS: 1 day   Time spent: 71mns, Greater than 50% of this time was spent in counseling, explanation of diagnosis, planning of further management, and coordination of care.   Voice Recognition /Viviann Sparedictation system was used to create this note, attempts have been made to correct errors. Please contact the author with questions and/or clarifications.   FFlorencia Reasons MD PhD FACP [facility]  Available via Epic secure chat 7am-7pm for nonurgent issues Please page for urgent issues To page the attending provider between 7A-7P or the covering provider during after hours 7P-7A, please log into the web site [email or web address] and access using universal [place] password for that web site. If you do not have the password, please call the hospital operator.    08/28/2020, 9:42 AM

## 2020-08-29 LAB — PREPARE RBC (CROSSMATCH)

## 2020-08-29 LAB — CBC
HCT: 21 % — ABNORMAL LOW (ref 39.0–52.0)
Hemoglobin: 6.5 g/dL — CL (ref 13.0–17.0)
MCH: 25.6 pg — ABNORMAL LOW (ref 26.0–34.0)
MCHC: 31 g/dL (ref 30.0–36.0)
MCV: 82.7 fL (ref 80.0–100.0)
Platelets: 520 10*3/uL — ABNORMAL HIGH (ref 150–400)
RBC: 2.54 MIL/uL — ABNORMAL LOW (ref 4.22–5.81)
RDW: 17.4 % — ABNORMAL HIGH (ref 11.5–15.5)
WBC: 17.2 10*3/uL — ABNORMAL HIGH (ref 4.0–10.5)
nRBC: 0 % (ref 0.0–0.2)

## 2020-08-29 LAB — HEPATIC FUNCTION PANEL
ALT: 36 U/L (ref 0–44)
AST: 16 U/L (ref 15–41)
Albumin: 2.1 g/dL — ABNORMAL LOW (ref 3.5–5.0)
Alkaline Phosphatase: 120 U/L (ref 38–126)
Bilirubin, Direct: <0.1 mg/dL (ref 0.0–0.2)
Total Bilirubin: 0.6 mg/dL (ref 0.3–1.2)
Total Protein: 5.9 g/dL — ABNORMAL LOW (ref 6.5–8.1)

## 2020-08-29 LAB — GLUCOSE, CAPILLARY
Glucose-Capillary: 205 mg/dL — ABNORMAL HIGH (ref 70–99)
Glucose-Capillary: 142 mg/dL — ABNORMAL HIGH (ref 70–99)
Glucose-Capillary: 132 mg/dL — ABNORMAL HIGH (ref 70–99)
Glucose-Capillary: 138 mg/dL — ABNORMAL HIGH (ref 70–99)

## 2020-08-29 LAB — CULTURE, BLOOD (ROUTINE X 2): Special Requests: ADEQUATE

## 2020-08-29 LAB — BASIC METABOLIC PANEL
Anion gap: 8 (ref 5–15)
BUN: 28 mg/dL — ABNORMAL HIGH (ref 6–20)
CO2: 19 mmol/L — ABNORMAL LOW (ref 22–32)
Calcium: 8.4 mg/dL — ABNORMAL LOW (ref 8.9–10.3)
Chloride: 108 mmol/L (ref 98–111)
Creatinine, Ser: 1.88 mg/dL — ABNORMAL HIGH (ref 0.61–1.24)
GFR, Estimated: 42 mL/min — ABNORMAL LOW (ref >60)
Glucose, Bld: 260 mg/dL — ABNORMAL HIGH (ref 70–99)
Potassium: 4.4 mmol/L (ref 3.5–5.1)
Sodium: 135 mmol/L (ref 135–145)

## 2020-08-29 LAB — URIC ACID: Uric Acid, Serum: 11.2 mg/dL — ABNORMAL HIGH (ref 3.7–8.6)

## 2020-08-29 MED ORDER — COLCHICINE 0.6 MG PO TABS
0.6000 mg | ORAL_TABLET | Freq: Two times a day (BID) | ORAL | Status: AC
  Administered 2020-08-29 – 2020-08-31 (×6): 0.6 mg via ORAL
  Filled 2020-08-29 (×6): qty 1

## 2020-08-29 MED ORDER — INSULIN ASPART 100 UNIT/ML IJ SOLN
5.0000 [IU] | Freq: Three times a day (TID) | INTRAMUSCULAR | Status: DC
  Administered 2020-08-29 – 2020-08-30 (×4): 5 [IU] via SUBCUTANEOUS

## 2020-08-29 MED ORDER — SODIUM CHLORIDE 0.9 % IV SOLN
12.5000 mg | Freq: Four times a day (QID) | INTRAVENOUS | Status: DC | PRN
  Filled 2020-08-29: qty 0.5

## 2020-08-29 MED ORDER — SODIUM CHLORIDE 0.9% IV SOLUTION
Freq: Once | INTRAVENOUS | Status: AC
Start: 2020-08-29 — End: 2020-08-29

## 2020-08-29 MED ORDER — OXYCODONE HCL 5 MG PO TABS
10.0000 mg | ORAL_TABLET | ORAL | Status: DC | PRN
  Administered 2020-08-29: 15 mg via ORAL
  Administered 2020-08-30 (×3): 10 mg via ORAL
  Administered 2020-08-31 (×2): 15 mg via ORAL
  Administered 2020-08-31: 10 mg via ORAL
  Administered 2020-09-01: 15 mg via ORAL
  Filled 2020-08-29: qty 2
  Filled 2020-08-29: qty 3
  Filled 2020-08-29 (×2): qty 2
  Filled 2020-08-29: qty 3
  Filled 2020-08-29: qty 2
  Filled 2020-08-29 (×2): qty 3

## 2020-08-29 NOTE — Plan of Care (Signed)
  Problem: Education: Goal: Knowledge of General Education information will improve Description Including pain rating scale, medication(s)/side effects and non-pharmacologic comfort measures Outcome: Progressing   

## 2020-08-29 NOTE — Progress Notes (Signed)
Contacted primary, Nicole Kindred DO regarding enteric precautions and need for stool specimen to test for C Diff, considering that his infection with C Diff was over a month ago. Also, checked with infection prevention and they were in agreement. Both Nicole Kindred and infection prevention stated that precautions should be discontinued and no need for stool specimen. Orders for above discontinued.

## 2020-08-29 NOTE — Progress Notes (Signed)
Hemoglobin 6.5  Hal Hope, MD paged.

## 2020-08-29 NOTE — Progress Notes (Signed)
PROGRESS NOTE    Patrick [name]   [id number]  DOB: 1966/01/25  [facility]: [facility]    DOA: 08/26/2020 LOS: 2   Assessment & Plan   Principal Problem:   Sepsis due to cellulitis [facility]) Active Problems:   Insulin-requiring or dependent type II diabetes mellitus ([place])   HYPERTENSION, BENIGN ESSENTIAL   Diabetic foot ulcer ([place])   Diabetic foot infection ([place])   CKD (chronic kidney disease) stage 3, GFR 30-59 ml/min (HCC)   Osteomyelitis of right foot (HCC)   Anemia   Sepsis present on admission/Osteomyelitis, abscess, ulceration /right diabetic foot -With tachycardia, tachypnea, low temp 100.1, WBC 14, right foot infection -Started on empiric vanc, Rocephin, Flagyl on admission -Blood culture obtained from ED 1/4 grew staph -Urine culture +1000 colonies of enterococcus faecium -past wound culture from 05/2020 + staph and enterococcus -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, ortho Dr Louanne Skye input appreciated -per ortho communication with pharmacy need  abx coverage for possible cellulitis due to "There was a zone of edema and soft tissue sign of reactive edema so treat like cellulitis" -case discussed with pharmacy, will d/c vanc/cefepime/flagyl - abx changed to ampicillin>>amoxicillin for 5 days -F/u on final culture result     Acute on chronic normocytic anemia -hgb in 2021 was around 11-12 -hgb this year around 8-9 -hgb on presentation was 6 -s/p right bka has  hemovac on 7/11 - Hbg 6.5 - transfused 1 unit pRBC's this AM -now s/p transfusion of 4 units pRBC's   FOBT + Per RN stool is brown with streaks of blood - consistent with likely hemorrhoids. No ab pain, no n/v. No BM's since admission yet. Outpatient GI follow up  vs consult Monitor Hbg   Recent history of C. Difficile in 06/2020 Per chart review, treated with PO vanc. Not had BM in days, no longer active infection. D/C precautions and repeat test ordered on admission Started on prophylaxis dose oral vanc '125mg'$  bid     Right Elbow pain Hx of Gout - per Ortho, started on Colcrys.  Follow up uric acid.    Insulin-dependent type 2 diabetes, uncontrolled with hyperglycemia -A1c at 7.8% uncontrolled  -On Lantus, Novolog meatime + SSI -adjust insulin for inpatient goal 140-180  AKI on CKD stage IIIb - Cr near baseline, improved. Renally dose meds.  [place].     Obesity: Body mass index is 38.27 kg/m.  Complicates overall care and prognosis.  Recommend lifestyle modifications including physical activity and diet for weight loss and overall long-term health.   DVT prophylaxis: enoxaparin (LOVENOX) injection 40 mg Start: 08/28/20 0800 SCD's Start: 08/27/20 2341   Diet:  Diet Orders (From admission, onward)     Start     Ordered   08/27/20 2341  Diet Carb Modified Fluid consistency: Thin; Room service appropriate? Yes  Diet effective now       Question Answer Comment  Calorie Level Medium 1600-2000   Fluid consistency: Thin   Room service appropriate? Yes      08/27/20 2341              Code Status: Full Code   Brief Narrative / Hospital Course to Date:   History of hypertension, insulin-dependent type 2 diabetes, recent history of C. difficile ,present with right diabetic foot infection, found to have sepsis, acute anemia requiring transfusion.  Taken for right BKA by Dr. Louanne Skye on 08/27/20.  Subjective 08/29/20    Pt reports moderate pain.  Says he can wiggle his toes  and still feel it.  Still no BM.  Having hiccups.   Disposition Plan & Communication   Status is: Inpatient  Remains inpatient appropriate because: Anemia requiring repeat transfusion today.  Also rehab placement pending  Dispo: The patient is from: Home              Anticipated d/c is to: CIR              Patient currently is medically stable to d/c.   Difficult to place patient No    Consults, Procedures, Significant Events   Consultants:  Orthopedic surgery, Dr. Louanne Skye  Procedures:  Right BKA    Antimicrobials:  Anti-infectives (From admission, onward)    Start     Dose/Rate Route Frequency Ordered Stop   08/29/20 0600  amoxicillin (AMOXIL) capsule 500 mg        500 mg Oral Every 8 hours 08/28/20 1656 09/02/20 0559   08/28/20 1445  vancomycin (VANCOCIN) capsule 125 mg        125 mg Oral 2 times daily 08/28/20 1359     08/28/20 0000  vancomycin (VANCOREADY) IVPB 1500 mg/300 mL  Status:  Discontinued        1,500 mg 150 mL/hr over 120 Minutes Intravenous Every 24 hours 08/27/20 0320 08/28/20 1656   08/27/20 0600  cefTRIAXone (ROCEPHIN) 2 g in sodium chloride 0.9 % 100 mL IVPB  Status:  Discontinued        2 g 200 mL/hr over 30 Minutes Intravenous Every 24 hours 08/27/20 0256 08/28/20 1656   08/27/20 0400  metroNIDAZOLE (FLAGYL) IVPB 500 mg  Status:  Discontinued        500 mg 100 mL/hr over 60 Minutes Intravenous Every 8 hours 08/27/20 0256 08/28/20 1656   08/26/20 2330  vancomycin (VANCOCIN) 2,500 mg in sodium chloride 0.9 % 500 mL IVPB        2,500 mg 250 mL/hr over 120 Minutes Intravenous  Once 08/26/20 2321 08/27/20 0415   08/26/20 2330  cefTRIAXone (ROCEPHIN) 2 g in sodium chloride 0.9 % 100 mL IVPB        2 g 200 mL/hr over 30 Minutes Intravenous  Once 08/26/20 2321 08/27/20 0120         Micro    Objective   Vitals:   08/29/20 0551 08/29/20 0607 08/29/20 0655 08/29/20 1153  BP: (!) 95/59  100/63 (!) 156/94  Pulse: 85 79  89  Resp:    15  Temp: 98.3 F (36.8 C) 98.4 F (36.9 C)  98.3 F (36.8 C)  TempSrc: Oral Oral  Oral  SpO2: 94% 94%    Weight:      Height:        Intake/Output Summary (Last 24 hours) at 08/29/2020 1929 Last data filed at 08/29/2020 1629 Gross per 24 hour  Intake 315 ml  Output 1050 ml  Net -735 ml   Filed Weights   08/26/20 2142 08/27/20 2130  Weight: 131.5 kg (!) 142.6 kg    Physical Exam:  General exam: awake, alert, no acute distress HEENT: moist mucus membranes, hearing grossly normal  Respiratory system: CTAB, no  wheezes, rales or rhonchi, normal respiratory effort. Cardiovascular system: normal S1/S2, RRR Gastrointestinal system: soft, NT, ND Central nervous system: A&O x3. no gross focal neurologic deficits, normal speech Extremities: Right BKA dressing intact, no edema, normal tone Psychiatry: normal mood, congruent affect, judgement and insight appear normal  Labs   Data Reviewed: I have personally reviewed following labs and imaging  studies  CBC: Recent Labs  Lab 08/26/20 2325 08/27/20 1024 08/27/20 1955 08/28/20 0051 08/29/20 0157  WBC 14.0* 14.4*  --  16.5* 17.2*  NEUTROABS 10.3*  --   --   --   --   HGB 6.0* 8.3* 7.5* 7.2* 6.5*  HCT 21.0* 27.3* 22.0* 24.0* 21.0*  MCV 84.0 82.2  --  83.0 82.7  PLT 572* 588*  --  524* 123456*   Basic Metabolic Panel: Recent Labs  Lab 08/26/20 2205 08/27/20 1024 08/27/20 1955 08/28/20 0051 08/29/20 0157  NA 134* 136 138 135 135  K 4.4 3.8 4.4 4.8 4.4  CL 106 108  --  108 108  CO2 18* 18*  --  16* 19*  GLUCOSE 204* 143*  --  253* 260*  BUN 32* 30*  --  26* 28*  CREATININE 2.27* 1.96*  --  1.70* 1.88*  CALCIUM 8.7* 9.0  --  8.6* 8.4*   GFR: Estimated Creatinine Clearance: 70.1 mL/min (A) (by C-G formula based on SCr of 1.88 mg/dL (H)). Liver Function Tests: Recent Labs  Lab 08/26/20 2205 08/29/20 0157  AST 59* 16  ALT 81* 36  ALKPHOS 184* 120  BILITOT 0.7 0.6  PROT 7.1 5.9*  ALBUMIN 2.4* 2.1*   No results for input(s): LIPASE, AMYLASE in the last 168 hours. No results for input(s): AMMONIA in the last 168 hours. Coagulation Profile: Recent Labs  Lab 08/26/20 2312  INR 1.3*   Cardiac Enzymes: No results for input(s): CKTOTAL, CKMB, CKMBINDEX, TROPONINI in the last 168 hours. BNP (last 3 results) No results for input(s): PROBNP in the last 8760 hours. HbA1C: Recent Labs    08/27/20 1024  HGBA1C 7.8*   CBG: Recent Labs  Lab 08/28/20 1621 08/28/20 2128 08/29/20 0803 08/29/20 1152 08/29/20 1630  GLUCAP 284* 260*  205* 142* 132*   Lipid Profile: No results for input(s): CHOL, HDL, LDLCALC, TRIG, CHOLHDL, LDLDIRECT in the last 72 hours. Thyroid Function Tests: No results for input(s): TSH, T4TOTAL, FREET4, T3FREE, THYROIDAB in the last 72 hours. Anemia Panel: No results for input(s): VITAMINB12, FOLATE, FERRITIN, TIBC, IRON, RETICCTPCT in the last 72 hours. Sepsis Labs: Recent Labs  Lab 08/26/20 2205 08/26/20 2346  LATICACIDVEN 1.8 1.3    Recent Results (from the past 240 hour(s))  Resp Panel by RT-PCR (Flu A&B, Covid) Nasopharyngeal Swab     Status: None   Collection Time: 08/26/20  9:46 PM   Specimen: Nasopharyngeal Swab; Nasopharyngeal(NP) swabs in vial transport medium  Result Value Ref Range Status   SARS Coronavirus 2 by RT PCR NEGATIVE NEGATIVE Final    Comment: (NOTE) SARS-CoV-2 target nucleic acids are NOT DETECTED.  The SARS-CoV-2 RNA is generally detectable in upper respiratory specimens during the acute phase of infection. The lowest concentration of SARS-CoV-2 viral copies this assay can detect is 138 copies/mL. A negative result does not preclude SARS-Cov-2 infection and should not be used as the sole basis for treatment or other patient management decisions. A negative result may occur with  improper specimen collection/handling, submission of specimen other than nasopharyngeal swab, presence of viral mutation(s) within the areas targeted by this assay, and inadequate number of viral copies(<138 copies/mL). A negative result must be combined with clinical observations, patient history, and epidemiological information. The expected result is Negative.  Fact Sheet for Patients:  EntrepreneurPulse.com.au  Fact Sheet for Healthcare Providers:  IncredibleEmployment.be  This test is no t yet approved or cleared by the [place] and  has been authorized  for detection and/or diagnosis of SARS-CoV-2 by FDA under an Emergency Use  Authorization (EUA). This EUA will remain  in effect (meaning this test can be used) for the duration of the COVID-19 declaration under Section 564(b)(1) of the Act, 21 U.S.C.section 360bbb-3(b)(1), unless the authorization is terminated  or revoked sooner.       Influenza A by PCR NEGATIVE NEGATIVE Final   Influenza B by PCR NEGATIVE NEGATIVE Final    Comment: (NOTE) The Xpert Xpress SARS-CoV-2/FLU/RSV plus assay is intended as an aid in the diagnosis of influenza from Nasopharyngeal swab specimens and should not be used as a sole basis for treatment. Nasal washings and aspirates are unacceptable for Xpert Xpress SARS-CoV-2/FLU/RSV testing.  Fact Sheet for Patients: EntrepreneurPulse.com.au  Fact Sheet for Healthcare Providers: IncredibleEmployment.be  This test is not yet approved or cleared by the [place] FDA and has been authorized for detection and/or diagnosis of SARS-CoV-2 by FDA under an Emergency Use Authorization (EUA). This EUA will remain in effect (meaning this test can be used) for the duration of the COVID-19 declaration under Section 564(b)(1) of the Act, 21 U.S.C. section 360bbb-3(b)(1), unless the authorization is terminated or revoked.  Performed at [facility], [place] [address], [place], [place] [address]   Urine culture     Status: Abnormal (Preliminary result)   Collection Time: 08/26/20  9:46 PM   Specimen: In/Out Cath Urine  Result Value Ref Range Status   Specimen Description IN/OUT CATH URINE  Final   Special Requests NONE  Final   Culture (A)  Final    1,000 COLONIES/mL ENTEROCOCCUS FAECIUM SUSCEPTIBILITIES TO FOLLOW Performed at [facility], [place] [address], [place], [place] [address]    Report Status PENDING  Incomplete  Blood Culture (routine x 2)     Status: Abnormal   Collection Time: 08/26/20 10:05 PM   Specimen: BLOOD RIGHT ARM  Result Value Ref Range Status   Specimen Description  BLOOD RIGHT ARM  Final   Special Requests   Final    BOTTLES DRAWN AEROBIC AND ANAEROBIC Blood Culture adequate volume   Culture  Setup Time   Final    GRAM POSITIVE COCCI IN CLUSTERS AEROBIC BOTTLE ONLY CRITICAL RESULT CALLED TO, READ BACK BY AND VERIFIED WITH: PHARMD LAURA SEAY BY MESSAN H. AT [id number] ON 7  10 2022    Culture (A)  Final    STAPHYLOCOCCUS HOMINIS THE SIGNIFICANCE OF ISOLATING THIS ORGANISM FROM A SINGLE SET OF BLOOD CULTURES WHEN MULTIPLE SETS ARE DRAWN IS UNCERTAIN. PLEASE NOTIFY THE MICROBIOLOGY DEPARTMENT WITHIN ONE WEEK IF SPECIATION AND SENSITIVITIES ARE REQUIRED. Performed at [facility], [place] [address], [place], [place] [address]    Report Status 08/29/2020 FINAL  Final  Blood Culture ID Panel (Reflexed)     Status: Abnormal   Collection Time: 08/26/20 10:05 PM  Result Value Ref Range Status   Enterococcus faecalis NOT DETECTED NOT DETECTED Final   Enterococcus Faecium NOT DETECTED NOT DETECTED Final   Listeria monocytogenes NOT DETECTED NOT DETECTED Final   Staphylococcus species DETECTED (A) NOT DETECTED Final    Comment: CRITICAL RESULT CALLED TO, READ BACK BY AND VERIFIED WITH: PHARMD LAURA SEAY BY MESSAN H. AT [id number] ON 7  10 2022    Staphylococcus aureus (BCID) NOT DETECTED NOT DETECTED Final   Staphylococcus epidermidis NOT DETECTED NOT DETECTED Final   Staphylococcus lugdunensis NOT DETECTED NOT DETECTED Final   Streptococcus species NOT DETECTED NOT DETECTED Final  Streptococcus agalactiae NOT DETECTED NOT DETECTED Final   Streptococcus pneumoniae NOT DETECTED NOT DETECTED Final   Streptococcus pyogenes NOT DETECTED NOT DETECTED Final   A.calcoaceticus-baumannii NOT DETECTED NOT DETECTED Final   Bacteroides fragilis NOT DETECTED NOT DETECTED Final   Enterobacterales NOT DETECTED NOT DETECTED Final   Enterobacter cloacae complex NOT DETECTED NOT DETECTED Final   Escherichia coli NOT DETECTED NOT DETECTED Final   Klebsiella aerogenes NOT DETECTED  NOT DETECTED Final   Klebsiella oxytoca NOT DETECTED NOT DETECTED Final   Klebsiella pneumoniae NOT DETECTED NOT DETECTED Final   Proteus species NOT DETECTED NOT DETECTED Final   Salmonella species NOT DETECTED NOT DETECTED Final   Serratia marcescens NOT DETECTED NOT DETECTED Final   Haemophilus influenzae NOT DETECTED NOT DETECTED Final   Neisseria meningitidis NOT DETECTED NOT DETECTED Final   Pseudomonas aeruginosa NOT DETECTED NOT DETECTED Final   Stenotrophomonas maltophilia NOT DETECTED NOT DETECTED Final   Candida albicans NOT DETECTED NOT DETECTED Final   Candida auris NOT DETECTED NOT DETECTED Final   Candida glabrata NOT DETECTED NOT DETECTED Final   Candida krusei NOT DETECTED NOT DETECTED Final   Candida parapsilosis NOT DETECTED NOT DETECTED Final   Candida tropicalis NOT DETECTED NOT DETECTED Final   Cryptococcus neoformans/gattii NOT DETECTED NOT DETECTED Final    Comment: Performed at [facility], [address], [place], [place] [address]  Blood Culture (routine x 2)     Status: None (Preliminary result)   Collection Time: 08/26/20 10:06 PM   Specimen: BLOOD LEFT ARM  Result Value Ref Range Status   Specimen Description BLOOD LEFT ARM  Final   Special Requests   Final    BOTTLES DRAWN AEROBIC ONLY Blood Culture adequate volume   Culture   Final    NO GROWTH 3 DAYS Performed at [facility], [place] [address] [place], [place] [address]    Report Status PENDING  Incomplete      Imaging Studies   No results found.   Medications   Scheduled Meds:  sodium chloride   Intravenous Once   allopurinol  100 mg Oral BID   amoxicillin  500 mg Oral Q8H   vitamin C  1,000 mg Oral Daily   colchicine  0.6 mg Oral BID   docusate sodium  100 mg Oral Daily   enoxaparin (LOVENOX) injection  40 mg Subcutaneous Q24H   ferrous sulfate  325 mg Oral TID PC   gabapentin  300 mg Oral TID   insulin aspart  0-20 Units Subcutaneous TID WC   insulin aspart  0-5 Units  Subcutaneous QHS   insulin aspart  5 Units Subcutaneous TID WC   insulin glargine  30 Units Subcutaneous BID   metoprolol tartrate  12.5 mg Oral BID   multivitamin with minerals  1 tablet Oral Daily   pantoprazole  40 mg Oral Daily   Ensure Max Protein  11 oz Oral BID   tiZANidine  4 mg Oral QHS   vancomycin  125 mg Oral BID   zinc sulfate  220 mg Oral Daily   Continuous Infusions:  chlorproMAZINE (THORAZINE) IV     magnesium sulfate bolus IVPB         LOS: 2 days    Time spent: 30 minutes    Ezekiel Slocumb, DO [facility]  08/29/2020, 7:29 PM      If 7PM-7AM, please contact night-coverage. How to contact the [facility] Attending or Consulting provider [place]  or covering provider during after hours [place], for this patient?    Check the care team in [facility] and look for a) attending/consulting TRH provider listed and b) the [facility] team listed Log into [email or web address] and use [place]'s universal password to access. If you do not have the password, please contact the hospital operator. Locate the [facility] provider you are looking for under [facility] and page to a number that you can be directly reached. If you still have difficulty reaching the provider, please page the [facility] (Director on Call) for the Hospitalists listed on amion for assistance.

## 2020-08-29 NOTE — TOC Initial Note (Signed)
Transition of Care [facility]) - Initial/Assessment Note    Patient Details  Name: Patrick [name] MRN: [id number] Date of Birth: 05/06/1965  Transition of [facility]) CM/SW Contact:    Bethena Roys, RN Phone Number: 08/29/2020, 12:26 PM  Clinical Narrative: Risk for readmission assess ment completed. Prior to admission patient was from home with friends and family in a single family home. Patient states he has PCP @ [facility] Dr. Jeanie Cooks. Patient is able to get medications without any issues. Patient states he is looking into [facility] [place] transportation once he transitions home. CIR Inpatient Rehab Admissions Coordinator will begin insurance authorization with [facility]. Case Manager will continue to follow for additional transition of care needs.               Expected Discharge Plan: IP Rehab Facility Barriers to Discharge: Continued Medical Work up   Patient Goals and CMS Choice Patient states their goals for this hospitalization and ongoing recovery are:: to go to rehab once stable.   Choice offered to / list presented to : NA  Expected Discharge Plan and Services Expected Discharge Plan: [place] In-house Referral: NA Discharge Planning Services: CM Consult Post Acute Care Choice: IP Rehab Living arrangements for the past 2 months: Single Family Home                   DME Agency: NA     Prior Living Arrangements/Services Living arrangements for the past 2 months: Single Family Home Lives with:: Self, Relatives, Friends Patient language and need for interpreter reviewed:: Yes Do you feel safe going back to the place where you live?: Yes      Need for Family Participation in Patient Care: Yes (Comment) Care giver support system in place?: Yes (comment)   Criminal Activity/Legal Involvement Pertinent to Current Situation/Hospitalization: No - Comment as needed  Activities of Daily Living Home Assistive Devices/Equipment: Crutches ADL Screening  (condition at time of admission) Patient's cognitive ability adequate to safely complete daily activities?: Yes Is the patient deaf or have difficulty hearing?: No Does the patient have difficulty seeing, even when wearing glasses/contacts?: Yes Does the patient have difficulty concentrating, remembering, or making decisions?: No Patient able to express need for assistance with ADLs?: Yes Does the patient have difficulty dressing or bathing?: No Independently performs ADLs?: Yes (appropriate for developmental age) Does the patient have difficulty walking or climbing stairs?: Yes Weakness of Legs: Left Weakness of Arms/Hands: None  Permission Sought/Granted Permission sought to share information with : Family Supports, Customer service manager, Case Manager   Emotional Assessment Appearance:: Appears stated age Attitude/Demeanor/Rapport: Engaged Affect (typically observed): Appropriate Orientation: : Oriented to  Time, Oriented to Place, Oriented to Self, Oriented to Situation Alcohol / Substance Use: Not Applicable Psych Involvement: No (comment)  Admission diagnosis:  Diabetic foot infection ([place]) [id number], L08.9] Osteomyelitis of right foot, unspecified type ([place]) [M86.9] Patient Active Problem List   Diagnosis Date Noted   Anemia 08/27/2020   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 ([place]) 07/20/2020   Dehiscence of amputation stump ([place]) 07/20/2020   Cutaneous abscess of right foot    Osteomyelitis of right foot ([place])    Severe protein-calorie malnutrition ([place])    Sepsis due to cellulitis ([place]) 06/04/2020   Diabetic foot ulcer ([place]) 06/04/2020   Diabetic foot infection ([place]) 06/04/2020   CKD (chronic kidney disease) stage 3, GFR 30-59 ml/min ([place]) 06/04/2020   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[place]) 06/04/2020  Acute respiratory disease due to COVID-19 virus 05/03/2019   Renal insufficiency 05/03/2019   GOUT 01/27/2010   OBESITY 01/27/2010    Insulin-requiring or dependent type II diabetes mellitus ([place]) 02/20/2008   HYPERTENSION, BENIGN ESSENTIAL 02/20/2008   PCP:  [facility] Pharmacy:   [facility] [place] - [address] [place] [address] Phone: [phone] Fax: [place], [place] [address] Phone: [phone] Fax: [phone]  [place] [address], [place] - [place] [id number] [place] [id number] Phone: [phone] Fax: [phone]  Readmission Risk Interventions Readmission Risk Prevention Plan 08/29/2020  Transportation Screening Complete  PCP or Specialist Appt within 3-5 Days Complete  HRI or Home Care Consult Complete  Social Work Consult for [place] Planning/Counseling Complete  Palliative Care Screening Not Applicable  Medication Review Press photographer) Complete  Some recent data might be hidden

## 2020-08-29 NOTE — Progress Notes (Signed)
Patient ID: Patrick [name], male   DOB: May 16, 1965, 55 y.o.   MRN: [id number] Complains of history of gout and right elbow pain, no effusion, tender but not warm. Check uric acid, previously as high as 11 in the last year.  Will try colcrys 0.6 as he has infection would not recommend steroid and as he has increased Creatinine would avoid indocin.

## 2020-08-29 NOTE — Progress Notes (Signed)
     Subjective: 2 Days Post-Op Procedure(s) (LRB): AMPUTATION BELOW KNEE (Right) Right BKA dressing intact, he is able to actively extend knee and perform SLR. Discontinue the knee immobilizer. Consider an extension brace.  Patient reports pain as moderate.    Objective:   VITALS:  Temp:  [97.9 F (36.6 C)-98.4 F (36.9 C)] 98.3 F (36.8 C) (07/11 1153) Pulse Rate:  [79-92] 89 (07/11 1153) Resp:  [15-20] 15 (07/11 1153) BP: (95-156)/(59-94) 156/94 (07/11 1153) SpO2:  [94 %-99 %] 94 % (07/11 0607)  Neurologically intact ABD soft Neurovascular intact Sensation intact distally Intact pulses distally Incision: dressing C/D/I, scant drainage, and Hemovac removed today.   LABS Recent Labs    08/26/20 2325 08/27/20 1024 08/27/20 1955 08/28/20 0051 08/29/20 0157  HGB 6.0* 8.3* 7.5* 7.2* 6.5*  WBC 14.0* 14.4*  --  16.5* 17.2*  PLT 572* 588*  --  524* 520*   Recent Labs    08/28/20 0051 08/29/20 0157  NA 135 135  K 4.8 4.4  CL 108 108  CO2 16* 19*  BUN 26* 28*  CREATININE 1.70* 1.88*  GLUCOSE 253* 260*   Recent Labs    08/26/20 2312  INR 1.3*     Assessment/Plan: 2 Days Post-Op Procedure(s) (LRB): AMPUTATION BELOW KNEE (Right)  Advance diet Up with therapy Discharge to CIR or SNF. I will change bandage tomorrow. He had a zone of edema and likely soft tissue reaction to infection and I would recommend treating as a cellulitis, Bone was not infected at the site of amputation.   [name] 08/29/2020, 1:03 PM Patient ID: Patrick [name], male   DOB: 08/05/1965, 55 y.o.   MRN: [id number]

## 2020-08-29 NOTE — Progress Notes (Signed)
Physical Therapy Treatment Patient Details Name: Patrick [name] MRN: [id number] DOB: [birth year] Today's Date: 08/29/2020    History of Present Illness 55 yo admitted 7/8 with sepsis due to Rt foot. 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 PMHx: DM, HTN, gout, Rth 4th and 5th ray amputations    PT Comments    Pt with 6.5 Hbg this AM. Session focused on LE HEP due to undergoing blood transfusion. Pt remained supine in bed at end of session. Pt very motivated to participate in therapy and regain his independence.    Follow Up Recommendations  CIR     Equipment Recommendations  Wheelchair (measurements PT);Wheelchair cushion (measurements PT);Rolling walker with 5" wheels;3in1 (PT)    Recommendations for Other Services       Precautions / Restrictions      Mobility  Bed Mobility                    Transfers                    Ambulation/Gait                 Stairs             Wheelchair Mobility    Modified Rankin (Stroke Patients Only)       Balance                                            Cognition                                              Exercises Amputee Exercises Quad Sets: AROM;Right;10 reps;Supine Gluteal Sets: AROM;Both;10 reps;Supine Hip ABduction/ADduction: AROM;Right;10 reps;Supine Knee Flexion: AROM;Right;10 reps;Supine Straight Leg Raises: AROM;Right;Supine;10 reps    General Comments        Pertinent Vitals/Pain      Home Living                      Prior Function            PT Goals (current goals can now be found in the care plan section) Acute Rehab PT Goals Patient Stated Goal: fish, walk with a prosthesis Progress towards PT goals: Progressing toward goals    Frequency    Min 4X/week      PT Plan Current plan remains appropriate    Co-evaluation              AM-PAC PT "6 Clicks" Mobility   Outcome Measure  Help needed turning from your back to  your side while in a flat bed without using bedrails?: A Little Help needed moving from lying on your back to sitting on the side of a flat bed without using bedrails?: A Little Help needed moving to and from a bed to a chair (including a wheelchair)?: A Little Help needed standing up from a chair using your arms (e.g., wheelchair or bedside chair)?: A Lot Help needed to walk in hospital room?: Total Help needed climbing 3-5 steps with a railing? : Total 6 Click Score: 13    End of Session   Activity Tolerance: Patient tolerated treatment well Patient left: in bed;with call bell/phone within reach;with bed alarm set  PT Visit Diagnosis: Other abnormalities of gait and mobility (R26.89);Difficulty in walking, not elsewhere classified (R26.2)     Time: [id number] PT Time Calculation (min) (ACUTE ONLY): 13 min  Charges:  $Therapeutic Exercise: 8-22 mins                     Lorrin Goodell, PT  Office # [phone] Pager [phone]    Lorriane Shire 08/29/2020, 8:22 AM

## 2020-08-29 NOTE — Progress Notes (Signed)
Inpatient Rehabilitation Admissions Coordinator   Inpatient rehab consult received I met with patient at bedside. We discussed goals and expectations of a possible CIR admit He  is in agreement .I will begin Auth with [facility] for a possible admit.  Danne Baxter, RN, MSN Rehab Admissions Coordinator [phone] 08/29/2020 10:23 AM

## 2020-08-30 DIAGNOSIS — L97519 Non-pressure chronic ulcer of other part of right foot with unspecified severity: Secondary | ICD-10-CM

## 2020-08-30 DIAGNOSIS — E11621 Type 2 diabetes mellitus with foot ulcer: Secondary | ICD-10-CM

## 2020-08-30 LAB — TYPE AND SCREEN
ABO/RH(D): AB POS
Antibody Screen: NEGATIVE
Unit division: 0
Unit division: 0
Unit division: 0
Unit division: 0

## 2020-08-30 LAB — BASIC METABOLIC PANEL
Anion gap: 6 (ref 5–15)
BUN: 20 mg/dL (ref 6–20)
CO2: 21 mmol/L — ABNORMAL LOW (ref 22–32)
Calcium: 8.5 mg/dL — ABNORMAL LOW (ref 8.9–10.3)
Chloride: 110 mmol/L (ref 98–111)
Creatinine, Ser: 1.48 mg/dL — ABNORMAL HIGH (ref 0.61–1.24)
GFR, Estimated: 56 mL/min — ABNORMAL LOW (ref >60)
Glucose, Bld: 138 mg/dL — ABNORMAL HIGH (ref 70–99)
Potassium: 4.3 mmol/L (ref 3.5–5.1)
Sodium: 137 mmol/L (ref 135–145)

## 2020-08-30 LAB — BPAM RBC
Blood Product Expiration Date: 202208032359
Blood Product Expiration Date: 202208032359
Blood Product Expiration Date: 202208092359
Blood Product Expiration Date: 202208092359
ISSUE DATE / TIME: 202207090300
ISSUE DATE / TIME: 202207090556
ISSUE DATE / TIME: 202207091328
ISSUE DATE / TIME: 202207110542
Unit Type and Rh: 6200
Unit Type and Rh: 6200
Unit Type and Rh: 8400
Unit Type and Rh: 8400

## 2020-08-30 LAB — GLUCOSE, CAPILLARY
Glucose-Capillary: 74 mg/dL (ref 70–99)
Glucose-Capillary: 110 mg/dL — ABNORMAL HIGH (ref 70–99)
Glucose-Capillary: 122 mg/dL — ABNORMAL HIGH (ref 70–99)
Glucose-Capillary: 89 mg/dL (ref 70–99)

## 2020-08-30 LAB — CBC
HCT: 24.2 % — ABNORMAL LOW (ref 39.0–52.0)
Hemoglobin: 7.7 g/dL — ABNORMAL LOW (ref 13.0–17.0)
MCH: 26.1 pg (ref 26.0–34.0)
MCHC: 31.8 g/dL (ref 30.0–36.0)
MCV: 82 fL (ref 80.0–100.0)
Platelets: 543 10*3/uL — ABNORMAL HIGH (ref 150–400)
RBC: 2.95 MIL/uL — ABNORMAL LOW (ref 4.22–5.81)
RDW: 17.2 % — ABNORMAL HIGH (ref 11.5–15.5)
WBC: 13.9 10*3/uL — ABNORMAL HIGH (ref 4.0–10.5)
nRBC: 0 % (ref 0.0–0.2)

## 2020-08-30 LAB — URINE CULTURE: Culture: 1000 — AB

## 2020-08-30 LAB — SURGICAL PATHOLOGY

## 2020-08-30 MED ORDER — INSULIN ASPART 100 UNIT/ML IJ SOLN
4.0000 [IU] | Freq: Three times a day (TID) | INTRAMUSCULAR | Status: DC
  Administered 2020-08-30 – 2020-09-01 (×3): 4 [IU] via SUBCUTANEOUS

## 2020-08-30 MED ORDER — POLYETHYLENE GLYCOL 3350 17 G PO PACK
17.0000 g | PACK | Freq: Every day | ORAL | Status: DC
  Administered 2020-08-30: 17 g via ORAL
  Filled 2020-08-30 (×2): qty 1

## 2020-08-30 NOTE — Progress Notes (Signed)
Inpatient Rehabilitation Admissions Coordinator   I have insurance approval for Cir but await bed availability . I met with patient and he is aware. Hopeful over the next 24 to 48 hrs.  Danne Baxter, RN, MSN Rehab Admissions Coordinator [phone] 08/30/2020 12:12 PM

## 2020-08-30 NOTE — H&P (Signed)
Physical Medicine and Rehabilitation Admission H&P    Chief Complaint  Patient presents with   Wound Infection    Right Foot  : HPI: Patrick [name] is a 55 year old right-handed male with history of diabetes mellitus, gout, chronic anemia with hemoglobin 8-9, obesity with BMI of 38.27, CKD stage III, hypertension, PVD with right fourth and fifth ray amputations 05/2020, recent COVID 04/2019 as well as a recent C. difficile 06/2020 treated with p.o. vancomycin.    Per chart review independent with assistive device prior to admission living with non relatives.  Level entry home with bed and bath upstairs.  Presented 08/26/2020 with low-grade fever/tachycardia as well as nonhealing right foot ulcer followed at the [facility] wound care clinic and has undergone multiple debridements.  Admission chemistry sodium 134, BUN 32, creatinine 2.27, WBC 14,000, hemoglobin 6.0, lactic acid 1.3.  Blood cultures obtained 1/4 grew staph, urine culture 1000 colony of Enterococcus.  Prior urine culture from admission with VRE and started on Zyvox.  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 Louanne Skye.  His Hemovac was discontinued 08/29/2020.  Initially maintained on broad-spectrum antibiotics simplified to amoxicillin completing course as directed.  Hospital course acute blood loss anemia 6.5 and has received a total of 4 units packed red blood cells with latest hemoglobin 7.5 as well as patient did have one positive Hemoccult stool and monitored.   Patient developed right elbow gout pain with uric acid 11.2 and placed on colchicine as well as Zyloprim.  He was cleared to begin Lovenox for DVT prophylaxis 08/28/2020.  In regards to patient's recent C. difficile cleared by infectious disease that he no longer needs contact precautions and would complete course of p.o. vancomycin.  Therapy evaluations completed due to patient's decreased functional mobility was  admitted for a comprehensive rehab program.  Pain comes and goes- pulled R groin again- hurting really bad with movement.  Some phantom pain, but not bad- mainly residual limb pain- oxycodone and gabapentin help.  Wants indomethicin as needed for groin/gout pain.  Gout is somewhat better but still bothersome in R elbow.   LBM yesterday; peeing well - still has COVID cough.    Review of Systems  Constitutional:  Positive for fever and malaise/fatigue.  HENT:  Negative for hearing loss.   Eyes:  Negative for blurred vision and double vision.  Respiratory:  Negative for cough and shortness of breath.   Cardiovascular:  Positive for leg swelling. Negative for chest pain and palpitations.  Gastrointestinal:  Positive for blood in stool and constipation. Negative for heartburn and nausea.  Genitourinary:  Negative for dysuria, flank pain and hematuria.  Musculoskeletal:  Positive for joint pain and myalgias.  Skin:  Negative for rash.  All other systems reviewed and are negative. Past Medical History:  Diagnosis Date   Diabetes mellitus    Gout    History of COVID-19 04/2019   Hypertension    Past Surgical History:  Procedure Laterality Date   AMPUTATION Right 06/06/2020   Procedure: AMPUTATION 5TH RAY;  Surgeon: [name], MD;  Location: [facility];  Service: Orthopedics;  Laterality: Right;   AMPUTATION Right 06/08/2020   Procedure: RIGHT 4TH RAY AMPUTATION;  Surgeon: [name], MD;  Location: [place];  Service: Orthopedics;  Laterality: Right;   AMPUTATION Right 08/27/2020   Procedure: AMPUTATION BELOW KNEE;  Surgeon: Jessy Oto, MD;  Location: [facility];  Service: Orthopedics;  Laterality: Right;   Family History  Problem Relation Age of Onset   Diabetes Neg Hx    Social History:  reports that he has never smoked. He has never used smokeless tobacco. He reports that he does not drink alcohol and does not use drugs. Allergies: No Known Allergies Medications Prior to Admission   Medication Sig Dispense Refill   acetaminophen (TYLENOL) 325 MG tablet Take 650 mg by mouth every 6 (six) hours as needed for headache, fever or moderate pain.     allopurinol (ZYLOPRIM) 100 MG tablet Take 1 tablet (100 mg total) by mouth 2 (two) times daily. 60 tablet 3   amLODipine (NORVASC) 10 MG tablet Take 1 tablet (10 mg total) by mouth daily. 30 tablet 1   blood glucose meter kit and supplies Dispense based on patient and insurance preference. Use up to four times daily as directed. (FOR ICD-10 E10.9, E11.9). 1 each 0   gabapentin (NEURONTIN) 300 MG capsule Take 300 mg by mouth 3 (three) times daily.     indomethacin (INDOCIN) 25 MG capsule Take 50 mg by mouth 3 (three) times daily as needed for mild pain.     insulin glargine (LANTUS SOLOSTAR) 100 UNIT/ML Solostar Pen Inject 28-60 Units into the skin 2 (two) times daily. Inject 60 units in the morning and 28 units at bedtime (Patient taking differently: Inject 28-60 Units into the skin See admin instructions. Inject 60 units in the morning and 28 units at bedtime) 15 mL 1   insulin lispro (HUMALOG) 200 UNIT/ML KwikPen Inject 10 Units into the skin 3 (three) times daily before meals. 6 mL 3   Insulin Pen Needle 32G X 8 MM MISC Use as directed 100 each 1   losartan (COZAAR) 100 MG tablet Take 1 tablet (100 mg total) by mouth daily. 30 tablet 0   metFORMIN (GLUCOPHAGE) 500 MG tablet Take 1 tablet (500 mg total) by mouth 2 (two) times daily with a meal. 60 tablet 0   metoprolol tartrate (LOPRESSOR) 25 MG tablet Take 1 tablet (25 mg total) by mouth 2 (two) times daily. 60 tablet 0   oxyCODONE-acetaminophen (PERCOCET) 5-325 MG tablet Take 1 tablet by mouth every 4 (four) hours as needed. (Patient taking differently: Take 1 tablet by mouth every 4 (four) hours as needed for moderate pain.) 30 tablet 0   sodium hypochlorite (DAKIN'S 1/2 STRENGTH) external solution Apply 1 application topically See admin instructions. Apply to gauze and apply to wound  twice daily     tiZANidine (ZANAFLEX) 4 MG tablet Take 4 mg by mouth at bedtime.     pantoprazole (PROTONIX) 40 MG tablet Take 1 tablet (40 mg total) by mouth daily. (Patient not taking: Reported on 08/26/2020) 30 tablet 0   vancomycin (VANCOCIN) 125 MG capsule Take 125 mg by mouth 4 (four) times daily. (Patient not taking: No sig reported)      Drug Regimen Review Drug regimen was reviewed and remains appropriate with no significant issues identified  Home: Home Living Family/patient expects to be discharged to:: Private residence Living Arrangements: Non-relatives/Friends Available Help at Discharge: Friend(s), Available 24 hours/day Type of Home: House Home Access: Level entry Home Layout: Bed/bath upstairs, 1/2 bath on main level Bathroom Shower/Tub: Chiropodist: Standard Home Equipment: Crutches Additional Comments: couch on main floor   Functional History: Prior Function Level of Independence: Independent with assistive device(s) Comments: using crutches since toe amputations but caring for himself  Functional Status:  Mobility: Bed Mobility Overal bed  mobility: Needs Assistance Bed Mobility: Supine to Sit Supine to sit: Min assist, HOB elevated General bed mobility comments: assist to elevate trunk due to R groin pain Transfers Overall transfer level: Needs assistance Transfers: Lateral/Scoot Transfers Sit to Stand: Mod assist, From elevated surface  Lateral/Scoot Transfers: Min assist General transfer comment: lateral scoot bed to drop-arm recliner toward L. Increased time. Cues for sequencing. Ambulation/Gait General Gait Details: unable    ADL: ADL Overall ADL's : Needs assistance/impaired Eating/Feeding: Independent, Sitting Grooming: Independent, Sitting Upper Body Bathing: Min guard, Sitting Lower Body Bathing: Minimal assistance, Sitting/lateral leans Upper Body Dressing : Independent, Sitting Lower Body Dressing: Minimal assistance,  Sitting/lateral leans Toilet Transfer: Min guard, Transfer board Toileting- Water quality scientist and Hygiene: Min guard, Sitting/lateral lean Tub/ Shower Transfer: Min guard, Transfer board Functional mobility during ADLs: Min guard General ADL Comments: Pt requiring min guard to min A with most ADL's while sitting and leaning. Pt unable to stand at this time without max A.  Cognition: Cognition Overall Cognitive Status: Within Functional Limits for tasks assessed Orientation Level: Oriented X4 Cognition Arousal/Alertness: Awake/alert Behavior During Therapy: WFL for tasks assessed/performed, Flat affect Overall Cognitive Status: Within Functional Limits for tasks assessed  Physical Exam: Blood pressure (!) 154/87, pulse 91, temperature 98.8 F (37.1 C), temperature source Oral, resp. rate 17, height '6\' 4"'  (1.93 m), weight (!) 142.6 kg, SpO2 100 %. Physical Exam Vitals and nursing note reviewed.  Constitutional:      Appearance: Normal appearance. He is obese.     Comments: Awake, alert, appropriate, sitting up in bed; - overweight, NAD  HENT:     Head: Normocephalic and atraumatic.     Right Ear: External ear normal.     Left Ear: External ear normal.     Nose: Nose normal. No congestion.     Mouth/Throat:     Mouth: Mucous membranes are dry.     Pharynx: Oropharynx is clear. No oropharyngeal exudate.  Eyes:     General:        Right eye: No discharge.        Left eye: No discharge.     Extraocular Movements: Extraocular movements intact.  Cardiovascular:     Rate and Rhythm: Normal rate and regular rhythm.     Heart sounds: Normal heart sounds. No murmur heard.   No gallop.  Pulmonary:     Comments: CTA B/L- no W/R/R- good air movement COVID sounding occ cough Abdominal:     Comments: Soft, NT, ND, (+)BS     Musculoskeletal:     Cervical back: Normal range of motion and neck supple.     Comments: MS: UE strength 5/5 in biceps, triceps, WE, grip and finger abd  B/L LE: RLE- HF 2.5 due to pain; KE/KF 4/5- R BKA noted LLE: 5/5 in HF, KE, KF DF and PF  Skin:    Comments: Right BKA dressed clean dry and intact with knee immobilizer in place Has almost a small "Y" in the middle of incision that is stapled- no drainage; moderate to significant edema/swelling- no sig, erythema;  L foot/ankle 3-4+ edema- Edema to midcalf 3+ Bruising and venous stasis- on LLE  Neurological:     Mental Status: He is alert.     Comments: Patient is alert and oriented x3.  Follows commands. Decreased sensation- on LLE  Psychiatric:     Comments: Appropriate, full affect    Results for orders placed or performed during the hospital encounter of 08/26/20 (from the  past 48 hour(s))  Glucose, capillary     Status: Abnormal   Collection Time: 08/30/20 11:48 AM  Result Value Ref Range   Glucose-Capillary 110 (H) 70 - 99 mg/dL    Comment: Glucose reference range applies only to samples taken after fasting for at least 8 hours.  Glucose, capillary     Status: Abnormal   Collection Time: 08/30/20  3:50 PM  Result Value Ref Range   Glucose-Capillary 122 (H) 70 - 99 mg/dL    Comment: Glucose reference range applies only to samples taken after fasting for at least 8 hours.  Glucose, capillary     Status: None   Collection Time: 08/30/20  9:15 PM  Result Value Ref Range   Glucose-Capillary 89 70 - 99 mg/dL    Comment: Glucose reference range applies only to samples taken after fasting for at least 8 hours.  CBC     Status: Abnormal   Collection Time: 08/31/20  3:08 AM  Result Value Ref Range   WBC 13.1 (H) 4.0 - 10.5 K/uL   RBC 2.96 (L) 4.22 - 5.81 MIL/uL   Hemoglobin 7.7 (L) 13.0 - 17.0 g/dL   HCT 24.7 (L) 39.0 - 52.0 %   MCV 83.4 80.0 - 100.0 fL   MCH 26.0 26.0 - 34.0 pg   MCHC 31.2 30.0 - 36.0 g/dL   RDW 17.3 (H) 11.5 - 15.5 %   Platelets 550 (H) 150 - 400 K/uL   nRBC 0.0 0.0 - 0.2 %    Comment: Performed at [facility] [address], [place],   [address]  Basic metabolic panel     Status: Abnormal   Collection Time: 08/31/20  3:08 AM  Result Value Ref Range   Sodium 135 135 - 145 mmol/L   Potassium 4.2 3.5 - 5.1 mmol/L   Chloride 105 98 - 111 mmol/L   CO2 20 (L) 22 - 32 mmol/L   Glucose, Bld 99 70 - 99 mg/dL    Comment: Glucose reference range applies only to samples taken after fasting for at least 8 hours.   BUN 20 6 - 20 mg/dL   Creatinine, Ser 1.42 (H) 0.61 - 1.24 mg/dL   Calcium 8.6 (L) 8.9 - 10.3 mg/dL   GFR, Estimated 59 (L) >60 mL/min    Comment: (NOTE) Calculated using the CKD-EPI Creatinine Equation (2021)    Anion gap 10 5 - 15    Comment: Performed at [facility] [address], [place], [place] [address]  Glucose, capillary     Status: Abnormal   Collection Time: 08/31/20  7:52 AM  Result Value Ref Range   Glucose-Capillary 67 (L) 70 - 99 mg/dL    Comment: Glucose reference range applies only to samples taken after fasting for at least 8 hours.  Glucose, capillary     Status: None   Collection Time: 08/31/20  8:41 AM  Result Value Ref Range   Glucose-Capillary 83 70 - 99 mg/dL    Comment: Glucose reference range applies only to samples taken after fasting for at least 8 hours.  Culture, blood (routine x 2)     Status: None (Preliminary result)   Collection Time: 08/31/20  9:32 AM   Specimen: BLOOD LEFT ARM  Result Value Ref Range   Specimen Description BLOOD LEFT ARM    Special Requests      BOTTLES DRAWN AEROBIC ONLY Blood Culture results may not be optimal due to an inadequate volume of blood  received in culture bottles   Culture      NO GROWTH < 24 HOURS Performed at [facility], [place] [address] [place], [place] [address]    Report Status PENDING   Culture, blood (routine x 2)     Status: None (Preliminary result)   Collection Time: 08/31/20  9:41 AM   Specimen: BLOOD LEFT HAND  Result Value Ref Range   Specimen Description BLOOD LEFT HAND    Special Requests      BOTTLES DRAWN  AEROBIC AND ANAEROBIC Blood Culture results may not be optimal due to an inadequate volume of blood received in culture bottles   Culture      NO GROWTH < 24 HOURS Performed at [facility] [address], [place], [place] [address]    Report Status PENDING   Glucose, capillary     Status: Abnormal   Collection Time: 08/31/20 11:10 AM  Result Value Ref Range   Glucose-Capillary 114 (H) 70 - 99 mg/dL    Comment: Glucose reference range applies only to samples taken after fasting for at least 8 hours.  Glucose, capillary     Status: Abnormal   Collection Time: 08/31/20  4:05 PM  Result Value Ref Range   Glucose-Capillary 127 (H) 70 - 99 mg/dL    Comment: Glucose reference range applies only to samples taken after fasting for at least 8 hours.  Glucose, capillary     Status: Abnormal   Collection Time: 08/31/20  8:33 PM  Result Value Ref Range   Glucose-Capillary 140 (H) 70 - 99 mg/dL    Comment: Glucose reference range applies only to samples taken after fasting for at least 8 hours.  Basic metabolic panel     Status: Abnormal   Collection Time: 09/01/20  4:39 AM  Result Value Ref Range   Sodium 136 135 - 145 mmol/L   Potassium 4.2 3.5 - 5.1 mmol/L   Chloride 107 98 - 111 mmol/L   CO2 22 22 - 32 mmol/L   Glucose, Bld 118 (H) 70 - 99 mg/dL    Comment: Glucose reference range applies only to samples taken after fasting for at least 8 hours.   BUN 18 6 - 20 mg/dL   Creatinine, Ser 1.47 (H) 0.61 - 1.24 mg/dL   Calcium 8.5 (L) 8.9 - 10.3 mg/dL   GFR, Estimated 57 (L) >60 mL/min    Comment: (NOTE) Calculated using the CKD-EPI Creatinine Equation (2021)    Anion gap 7 5 - 15    Comment: Performed at [facility] [address], [place], [place] [address]  CBC     Status: Abnormal   Collection Time: 09/01/20  4:39 AM  Result Value Ref Range   WBC 12.3 (H) 4.0 - 10.5 K/uL   RBC 2.90 (L) 4.22 - 5.81 MIL/uL   Hemoglobin 7.5 (L) 13.0 - 17.0 g/dL   HCT 24.2 (L) 39.0 - 52.0  %   MCV 83.4 80.0 - 100.0 fL   MCH 25.9 (L) 26.0 - 34.0 pg   MCHC 31.0 30.0 - 36.0 g/dL   RDW 17.2 (H) 11.5 - 15.5 %   Platelets 558 (H) 150 - 400 K/uL   nRBC 0.0 0.0 - 0.2 %    Comment: Performed at [facility] [address], [place], [place] [address]  Glucose, capillary     Status: Abnormal   Collection Time: 09/01/20  8:40 AM  Result Value Ref Range  Glucose-Capillary 104 (H) 70 - 99 mg/dL    Comment: Glucose reference range applies only to samples taken after fasting for at least 8 hours.   Comment 1 Notify RN    DG CHEST PORT 1 VIEW  Result Date: 08/31/2020 CLINICAL DATA:  Fever EXAM: PORTABLE CHEST 1 VIEW COMPARISON:  08/26/2020 FINDINGS: The heart size and mediastinal contours are stable. No focal airspace consolidation, pleural effusion, or pneumothorax. The visualized skeletal structures are unremarkable. IMPRESSION: No active disease. Electronically Signed   By: Davina Poke D.O.   On: 08/31/2020 12:40       Medical Problem List and Plan: 1.   Debility secondary to RIGHT BKA 06/26/8467 complicated by sepsis  -patient may shower if R BKA covered  -ELOS/Goals: 7-10 days mod I to supervision 2.  Antithrombotics: -DVT/anticoagulation: Lovenox  -antiplatelet therapy: N/A 3. Pain Management: Neurontin 300 mg 3 times daily, Zanaflex 4 mg nightly, oxycodone as needed- Gabapentin is the most help- asked for some indomethicin/Toradol- will give ONE dose of Toradol PO due to CKD- won't do more right now, for gout and R groin strain.  4. Mood: Provide emotional support  -antipsychotic agents: N/A 5. Neuropsych: This patient is capable of making decisions on his own behalf. 6. Skin/Wound Care: Routine skin checks 7. Fluids/Electrolytes/Nutrition: Routine in and outs with follow-up chemistries 8.  Acute on chronic anemia.  Patient had received total 4 units packed red blood cells.  Continue iron supplement.  Follow-up CBC 9.  Diabetes mellitus with peripheral neuropathy.   Hemoglobin A1c 7.8.  Lantus insulin 25 units twice daily, NovoLog 4 units 3 times daily with meals.  Diabetic teaching 10.  Right elbow gout.  Uric acid 11.2.  Zyloprim 100 mg twice daily.  Course of colchicine has been completed. 11.  ID/sepsis.  Blood cultures 1/4 grew staph.  Completing course of amoxicillin after initially being maintained on cefepime 12.  CKD stage III.  Follow-up chemistries 13.  Recent history C. difficile 06/2020.  Completing course of p.o. vancomycin.  Contact precautions discontinued 14.VRE/UTI.Contact precautions and complete course of Zyvox 14.  Obesity.  BMI 38.27.  Dietary follow-up 15.  Tachycardia.  Lopressor 12.5 mg twice daily.  Monitor with increased mobility 16. R BKA- suggested laying on abd/stomach daily to reduce risk of hip contracture as well as no pillow under knee.    Lavon Paganini Angiulli, PA-C 09/01/2020   I have personally performed a face to face diagnostic evaluation of this patient and formulated the key components of the plan.  Additionally, I have personally reviewed laboratory data, imaging studies, as well as relevant notes and concur with the physician assistant's documentation above.   The patient's status has not changed from the original H&P.  Any changes in documentation from the acute care chart have been noted above.

## 2020-08-30 NOTE — Progress Notes (Addendum)
PROGRESS NOTE    Patrick [name]   [id number]  DOB: 05/18/1965  PCP: [facility]    DOA: 08/26/2020 LOS: 3   Assessment & Plan   Principal Problem:   Osteomyelitis of right foot ([place]) Active Problems:   Insulin-requiring or dependent type II diabetes mellitus ([place])   HYPERTENSION, BENIGN ESSENTIAL   Sepsis due to cellulitis [facility])   Diabetic foot ulcer ([place])   Diabetic foot infection ([place])   CKD (chronic kidney disease) stage 3, GFR 30-59 ml/min (HCC)   Anemia   Sepsis present on admission/Osteomyelitis, abscess, ulceration /right diabetic foot -With tachycardia, tachypnea, low temp 100.1, WBC 14, right foot infection -Started on empiric vanc, Rocephin, Flagyl on admission -Blood culture obtained from ED 1/4 grew staph -Urine culture +1000 colonies of enterococcus faecium -past wound culture from 05/2020 + staph and enterococcus -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, ortho Dr Louanne Skye input appreciated -per ortho communication with pharmacy need  abx coverage for possible cellulitis due to "There was a zone of edema and soft tissue sign of reactive edema so treat like cellulitis" -d/c'd vanc/cefepime/flagyl -abx changed to ampicillin>>amoxicillin for 5 days -F/u on final culture result SEE Ortho Recommendations     Acute on chronic normocytic anemia -hgb in 2021 was around 11-12 -hgb this year around 8-9 -hgb on presentation was 6 -s/p right bka has  hemovac on 7/11 - Hbg 6.5 - transfused 1 unit pRBC's -s/p transfusion of total 4 units pRBC's Hbg stable and improved today 7.7   FOBT + Per RN stool is brown with streaks of blood - consistent with likely hemorrhoids. No ab pain, no n/v. No BM's since admission yet. Outpatient GI follow up  vs consult Monitor Hbg   Recent history of C. Difficile in 06/2020 Per chart review, treated with PO vanc. Not had BM in days, no longer active infection. D/C precautions and repeat test ordered on admission Started on prophylaxis dose oral  vanc '125mg'$  bid    Right Elbow pain Hx of Gout - per Ortho, started on Colcrys.  Follow up uric acid.    Insulin-dependent type 2 diabetes, uncontrolled with hyperglycemia -A1c at 7.8% uncontrolled  -On Lantus, Novolog meatime + SSI -adjust insulin for inpatient goal 140-180  AKI on CKD stage IIIb - Cr near baseline, improved. Renally dose meds.  [place].     Obesity: Body mass index is 38.27 kg/m.  Complicates overall care and prognosis.  Recommend lifestyle modifications including physical activity and diet for weight loss and overall long-term health.   DVT prophylaxis: enoxaparin (LOVENOX) injection 40 mg Start: 08/28/20 0800 SCD's Start: 08/27/20 2341   Diet:  Diet Orders (From admission, onward)     Start     Ordered   08/27/20 2341  Diet Carb Modified Fluid consistency: Thin; Room service appropriate? Yes  Diet effective now       Question Answer Comment  Calorie Level Medium 1600-2000   Fluid consistency: Thin   Room service appropriate? Yes      08/27/20 2341              Code Status: Full Code   Brief Narrative / Hospital Course to Date:   History of hypertension, insulin-dependent type 2 diabetes, recent history of C. difficile ,present with right diabetic foot infection, found to have sepsis, acute anemia requiring transfusion.  Taken for right BKA by Dr. Louanne Skye on 08/27/20. Therapy recommending CIR, insurance authorization pending.  Subjective 08/30/20    Pt has not had  BM but hesitant to use stool softener bc of need to mobilize to have BM and pain with that. States pain currently under control.     Disposition Plan & Communication   Status is: Inpatient  Remains inpatient appropriate because: Anemia requiring repeat transfusion today.  Also rehab placement pending  Dispo: The patient is from: Home              Anticipated d/c is to: CIR              Patient currently is medically stable to d/c.   Difficult to place patient No    Consults,  Procedures, Significant Events   Consultants:  Orthopedic surgery, Dr. Louanne Skye  Procedures:  Right BKA   Antimicrobials:  Anti-infectives (From admission, onward)    Start     Dose/Rate Route Frequency Ordered Stop   08/29/20 0600  amoxicillin (AMOXIL) capsule 500 mg        500 mg Oral Every 8 hours 08/28/20 1656 09/02/20 0559   08/28/20 1445  vancomycin (VANCOCIN) capsule 125 mg        125 mg Oral 2 times daily 08/28/20 1359     08/28/20 0000  vancomycin (VANCOREADY) IVPB 1500 mg/300 mL  Status:  Discontinued        1,500 mg 150 mL/hr over 120 Minutes Intravenous Every 24 hours 08/27/20 0320 08/28/20 1656   08/27/20 0600  cefTRIAXone (ROCEPHIN) 2 g in sodium chloride 0.9 % 100 mL IVPB  Status:  Discontinued        2 g 200 mL/hr over 30 Minutes Intravenous Every 24 hours 08/27/20 0256 08/28/20 1656   08/27/20 0400  metroNIDAZOLE (FLAGYL) IVPB 500 mg  Status:  Discontinued        500 mg 100 mL/hr over 60 Minutes Intravenous Every 8 hours 08/27/20 0256 08/28/20 1656   08/26/20 2330  vancomycin (VANCOCIN) 2,500 mg in sodium chloride 0.9 % 500 mL IVPB        2,500 mg 250 mL/hr over 120 Minutes Intravenous  Once 08/26/20 2321 08/27/20 0415   08/26/20 2330  cefTRIAXone (ROCEPHIN) 2 g in sodium chloride 0.9 % 100 mL IVPB        2 g 200 mL/hr over 30 Minutes Intravenous  Once 08/26/20 2321 08/27/20 0120         Micro    Objective   Vitals:   08/29/20 1153 08/29/20 2024 08/30/20 0538 08/30/20 1352  BP: (!) 156/94 (!) 153/90 (!) 166/96 (!) 160/90  Pulse: 89 92 89 93  Resp: 15 (!) '21 18 18  '$ Temp: 98.3 F (36.8 C) 99.2 F (37.3 C) 98.2 F (36.8 C) (!) 97.5 F (36.4 C)  TempSrc: Oral Oral Oral Oral  SpO2:  94% 94% 97%  Weight:      Height:        Intake/Output Summary (Last 24 hours) at 08/30/2020 1438 Last data filed at 08/30/2020 1100 Gross per 24 hour  Intake 240 ml  Output 825 ml  Net -585 ml   Filed Weights   08/26/20 2142 08/27/20 2130  Weight: 131.5 kg (!)  142.6 kg    Physical Exam:  General exam: sleeping woke to voice, no acute distress Respiratory system: CTAB, normal respiratory effort, on room air. Cardiovascular system: normal S1/S2, RRR Gastrointestinal system: non-tender, non-distended Central nervous system: A&O x3. no gross focal neurologic deficits, normal speech Extremities: Right BKA dressing intact, no edema, normal tone   Labs   Data Reviewed: I have personally reviewed  following labs and imaging studies  CBC: Recent Labs  Lab 08/26/20 2325 08/27/20 1024 08/27/20 1955 08/28/20 0051 08/29/20 0157 08/30/20 0022  WBC 14.0* 14.4*  --  16.5* 17.2* 13.9*  NEUTROABS 10.3*  --   --   --   --   --   HGB 6.0* 8.3* 7.5* 7.2* 6.5* 7.7*  HCT 21.0* 27.3* 22.0* 24.0* 21.0* 24.2*  MCV 84.0 82.2  --  83.0 82.7 82.0  PLT 572* 588*  --  524* 520* [id number]*   Basic Metabolic Panel: Recent Labs  Lab 08/26/20 2205 08/27/20 1024 08/27/20 1955 08/28/20 0051 08/29/20 0157 08/30/20 0022  NA 134* 136 138 135 135 137  K 4.4 3.8 4.4 4.8 4.4 4.3  CL 106 108  --  108 108 110  CO2 18* 18*  --  16* 19* 21*  GLUCOSE 204* 143*  --  253* 260* 138*  BUN 32* 30*  --  26* 28* 20  CREATININE 2.27* 1.96*  --  1.70* 1.88* 1.48*  CALCIUM 8.7* 9.0  --  8.6* 8.4* 8.5*   GFR: Estimated Creatinine Clearance: 89.1 mL/min (A) (by C-G formula based on SCr of 1.48 mg/dL (H)). Liver Function Tests: Recent Labs  Lab 08/26/20 2205 08/29/20 0157  AST 59* 16  ALT 81* 36  ALKPHOS 184* 120  BILITOT 0.7 0.6  PROT 7.1 5.9*  ALBUMIN 2.4* 2.1*   No results for input(s): LIPASE, AMYLASE in the last 168 hours. No results for input(s): AMMONIA in the last 168 hours. Coagulation Profile: Recent Labs  Lab 08/26/20 2312  INR 1.3*   Cardiac Enzymes: No results for input(s): CKTOTAL, CKMB, CKMBINDEX, TROPONINI in the last 168 hours. BNP (last 3 results) No results for input(s): PROBNP in the last 8760 hours. HbA1C: No results for input(s): HGBA1C in  the last 72 hours.  CBG: Recent Labs  Lab 08/29/20 1152 08/29/20 1630 08/29/20 2200 08/30/20 0745 08/30/20 1148  GLUCAP 142* 132* 138* 74 110*   Lipid Profile: No results for input(s): CHOL, HDL, LDLCALC, TRIG, CHOLHDL, LDLDIRECT in the last 72 hours. Thyroid Function Tests: No results for input(s): TSH, T4TOTAL, FREET4, T3FREE, THYROIDAB in the last 72 hours. Anemia Panel: No results for input(s): VITAMINB12, FOLATE, FERRITIN, TIBC, IRON, RETICCTPCT in the last 72 hours. Sepsis Labs: Recent Labs  Lab 08/26/20 2205 08/26/20 2346  LATICACIDVEN 1.8 1.3    Recent Results (from the past 240 hour(s))  Resp Panel by RT-PCR (Flu A&B, Covid) Nasopharyngeal Swab     Status: None   Collection Time: 08/26/20  9:46 PM   Specimen: Nasopharyngeal Swab; Nasopharyngeal(NP) swabs in vial transport medium  Result Value Ref Range Status   SARS Coronavirus 2 by RT PCR NEGATIVE NEGATIVE Final    Comment: (NOTE) SARS-CoV-2 target nucleic acids are NOT DETECTED.  The SARS-CoV-2 RNA is generally detectable in upper respiratory specimens during the acute phase of infection. The lowest concentration of SARS-CoV-2 viral copies this assay can detect is 138 copies/mL. A negative result does not preclude SARS-Cov-2 infection and should not be used as the sole basis for treatment or other patient management decisions. A negative result may occur with  improper specimen collection/handling, submission of specimen other than nasopharyngeal swab, presence of viral mutation(s) within the areas targeted by this assay, and inadequate number of viral copies(<138 copies/mL). A negative result must be combined with clinical observations, patient history, and epidemiological information. The expected result is Negative.  Fact Sheet for Patients:  EntrepreneurPulse.com.au  Fact Sheet for Healthcare  Providers:  IncredibleEmployment.be  This test is no t yet approved or  cleared by the [place] and  has been authorized for detection and/or diagnosis of SARS-CoV-2 by FDA under an Emergency Use Authorization (EUA). This EUA will remain  in effect (meaning this test can be used) for the duration of the COVID-19 declaration under Section 564(b)(1) of the Act, 21 U.S.C.section 360bbb-3(b)(1), unless the authorization is terminated  or revoked sooner.       Influenza A by PCR NEGATIVE NEGATIVE Final   Influenza B by PCR NEGATIVE NEGATIVE Final    Comment: (NOTE) The Xpert Xpress SARS-CoV-2/FLU/RSV plus assay is intended as an aid in the diagnosis of influenza from Nasopharyngeal swab specimens and should not be used as a sole basis for treatment. Nasal washings and aspirates are unacceptable for Xpert Xpress SARS-CoV-2/FLU/RSV testing.  Fact Sheet for Patients: EntrepreneurPulse.com.au  Fact Sheet for Healthcare Providers: IncredibleEmployment.be  This test is not yet approved or cleared by the [place] FDA and has been authorized for detection and/or diagnosis of SARS-CoV-2 by FDA under an Emergency Use Authorization (EUA). This EUA will remain in effect (meaning this test can be used) for the duration of the COVID-19 declaration under Section 564(b)(1) of the Act, 21 U.S.C. section 360bbb-3(b)(1), unless the authorization is terminated or revoked.  Performed at [facility], [place] [address] [place], [place] [address]   Urine culture     Status: Abnormal   Collection Time: 08/26/20  9:46 PM   Specimen: In/Out Cath Urine  Result Value Ref Range Status   Specimen Description IN/OUT CATH URINE  Final   Special Requests   Final    NONE Performed at [facility], [place] [address] [place], [place] [address]    Culture (A)  Final    1,000 COLONIES/mL VANCOMYCIN RESISTANT ENTEROCOCCUS   Report Status 08/30/2020 FINAL  Final   Organism ID, Bacteria VANCOMYCIN RESISTANT ENTEROCOCCUS (A)   Final      Susceptibility   Vancomycin resistant enterococcus - MIC*    AMPICILLIN >=32 RESISTANT Resistant     NITROFURANTOIN 32 SENSITIVE Sensitive     VANCOMYCIN >=32 RESISTANT Resistant     LINEZOLID 2 SENSITIVE Sensitive     * 1,000 COLONIES/mL VANCOMYCIN RESISTANT ENTEROCOCCUS  Blood Culture (routine x 2)     Status: Abnormal   Collection Time: 08/26/20 10:05 PM   Specimen: BLOOD RIGHT ARM  Result Value Ref Range Status   Specimen Description BLOOD RIGHT ARM  Final   Special Requests   Final    BOTTLES DRAWN AEROBIC AND ANAEROBIC Blood Culture adequate volume   Culture  Setup Time   Final    GRAM POSITIVE COCCI IN CLUSTERS AEROBIC BOTTLE ONLY CRITICAL RESULT CALLED TO, READ BACK BY AND VERIFIED WITH: PHARMD LAURA SEAY BY [place]. AT [id number] ON 7  10 2022    Culture (A)  Final    STAPHYLOCOCCUS HOMINIS THE SIGNIFICANCE OF ISOLATING THIS ORGANISM FROM A SINGLE SET OF BLOOD CULTURES WHEN MULTIPLE SETS ARE DRAWN IS UNCERTAIN. PLEASE NOTIFY THE MICROBIOLOGY DEPARTMENT WITHIN ONE WEEK IF SPECIATION AND SENSITIVITIES ARE REQUIRED. Performed at [facility], [place] [address], [place], [place] [address]    Report Status 08/29/2020 FINAL  Final  Blood Culture ID Panel (Reflexed)     Status: Abnormal   Collection Time: 08/26/20 10:05 PM  Result Value Ref Range Status   Enterococcus faecalis NOT DETECTED NOT DETECTED Final   Enterococcus Faecium  NOT DETECTED NOT DETECTED Final   Listeria monocytogenes NOT DETECTED NOT DETECTED Final   Staphylococcus species DETECTED (A) NOT DETECTED Final    Comment: CRITICAL RESULT CALLED TO, READ BACK BY AND VERIFIED WITH: PHARMD LAURA SEAY BY MESSAN H. AT [id number] ON 7  10 2022    Staphylococcus aureus (BCID) NOT DETECTED NOT DETECTED Final   Staphylococcus epidermidis NOT DETECTED NOT DETECTED Final   Staphylococcus lugdunensis NOT DETECTED NOT DETECTED Final   Streptococcus species NOT DETECTED NOT DETECTED Final   Streptococcus agalactiae NOT  DETECTED NOT DETECTED Final   Streptococcus pneumoniae NOT DETECTED NOT DETECTED Final   Streptococcus pyogenes NOT DETECTED NOT DETECTED Final   A.calcoaceticus-baumannii NOT DETECTED NOT DETECTED Final   Bacteroides fragilis NOT DETECTED NOT DETECTED Final   Enterobacterales NOT DETECTED NOT DETECTED Final   Enterobacter cloacae complex NOT DETECTED NOT DETECTED Final   Escherichia coli NOT DETECTED NOT DETECTED Final   Klebsiella aerogenes NOT DETECTED NOT DETECTED Final   Klebsiella oxytoca NOT DETECTED NOT DETECTED Final   Klebsiella pneumoniae NOT DETECTED NOT DETECTED Final   Proteus species NOT DETECTED NOT DETECTED Final   Salmonella species NOT DETECTED NOT DETECTED Final   Serratia marcescens NOT DETECTED NOT DETECTED Final   Haemophilus influenzae NOT DETECTED NOT DETECTED Final   Neisseria meningitidis NOT DETECTED NOT DETECTED Final   Pseudomonas aeruginosa NOT DETECTED NOT DETECTED Final   Stenotrophomonas maltophilia NOT DETECTED NOT DETECTED Final   Candida albicans NOT DETECTED NOT DETECTED Final   Candida auris NOT DETECTED NOT DETECTED Final   Candida glabrata NOT DETECTED NOT DETECTED Final   Candida krusei NOT DETECTED NOT DETECTED Final   Candida parapsilosis NOT DETECTED NOT DETECTED Final   Candida tropicalis NOT DETECTED NOT DETECTED Final   Cryptococcus neoformans/gattii NOT DETECTED NOT DETECTED Final    Comment: Performed at [facility], [address] [place], [place] [address]  Blood Culture (routine x 2)     Status: None (Preliminary result)   Collection Time: 08/26/20 10:06 PM   Specimen: BLOOD LEFT ARM  Result Value Ref Range Status   Specimen Description BLOOD LEFT ARM  Final   Special Requests   Final    BOTTLES DRAWN AEROBIC ONLY Blood Culture adequate volume   Culture   Final    NO GROWTH 4 DAYS Performed at [facility], [place] [address] [place], [place] [address]    Report Status PENDING  Incomplete      Imaging Studies    No results found.   Medications   Scheduled Meds:  sodium chloride   Intravenous Once   allopurinol  100 mg Oral BID   amoxicillin  500 mg Oral Q8H   vitamin C  1,000 mg Oral Daily   colchicine  0.6 mg Oral BID   docusate sodium  100 mg Oral Daily   enoxaparin (LOVENOX) injection  40 mg Subcutaneous Q24H   ferrous sulfate  325 mg Oral TID PC   gabapentin  300 mg Oral TID   insulin aspart  0-20 Units Subcutaneous TID WC   insulin aspart  0-5 Units Subcutaneous QHS   insulin aspart  5 Units Subcutaneous TID WC   insulin glargine  30 Units Subcutaneous BID   metoprolol tartrate  12.5 mg Oral BID   multivitamin with minerals  1 tablet Oral Daily   pantoprazole  40 mg Oral Daily   Ensure Max Protein  11 oz Oral BID   tiZANidine  4 mg Oral QHS   vancomycin  125 mg Oral BID   zinc sulfate  220 mg Oral Daily   Continuous Infusions:  chlorproMAZINE (THORAZINE) IV     magnesium sulfate bolus IVPB         LOS: 3 days    Time spent: 25 minutes with > 50% spent at bedside and in coordination of care     Ezekiel Slocumb, DO [facility]  08/30/2020, 2:38 PM      If 7PM-7AM, please contact night-coverage. How to contact the [facility] Attending or Consulting provider [place] or covering provider during after hours [place], for this patient?    Check the care team in [facility] and look for a) attending/consulting [facility] provider listed and b) the [facility] team listed Log into [email or web address] and use [place]'s universal password to access. If you do not have the password, please contact the hospital operator. Locate the [facility] provider you are looking for under [facility] and page to a number that you can be directly reached. If you still have difficulty reaching the provider, please page the [facility] (Director on Call) for the Hospitalists listed on amion for assistance.

## 2020-08-30 NOTE — Progress Notes (Signed)
     Subjective: 3 Days Post-Op Procedure(s) (LRB): AMPUTATION BELOW KNEE (Right) Awake but drowsy, alert and oriented x 4. An episode of chills he reports.  Patient reports pain as moderate.    Objective:   VITALS:  Temp:  [97.5 F (36.4 C)-99.2 F (37.3 C)] 97.5 F (36.4 C) (07/12 1352) Pulse Rate:  [89-93] 93 (07/12 1352) Resp:  [18-21] 18 (07/12 1352) BP: (153-166)/(90-96) 160/90 (07/12 1352) SpO2:  [94 %-97 %] 97 % (07/12 1352)  Neurologically intact ABD soft Neurovascular intact Sensation intact distally Intact pulses distally Incision: scant drainage and Dressing changed to Mepilex over staple line after painting with betadiene. The wrapped with ACE wrap. Needs a BK stump shrinking sock.  No cellulitis present Compartment soft   LABS Recent Labs    08/27/20 1955 08/28/20 0051 08/28/20 0051 08/29/20 0157 08/30/20 0022  HGB 7.5* 7.2*  --  6.5* 7.7*  WBC  --  16.5*   < > 17.2* 13.9*  PLT  --  524*   < > 520* 543*   < > = values in this interval not displayed.   Recent Labs    08/29/20 0157 08/30/20 0022  NA 135 137  K 4.4 4.3  CL 108 110  CO2 19* 21*  BUN 28* 20  CREATININE 1.88* 1.48*  GLUCOSE 260* 138*   No results for input(s): LABPT, INR in the last 72 hours.   Assessment/Plan: 3 Days Post-Op Procedure(s) (LRB): AMPUTATION BELOW KNEE (Right)  Advance diet Up with therapy D/C IV fluids Continue ABX therapy due to soft tussue induration and likely zonal soft tisssue infection  [name] 08/30/2020, 2:04 PM Patient ID: Patrick [name], male   DOB: 12/14/1965, 56 y.o.   MRN: [id number]

## 2020-08-30 NOTE — Progress Notes (Signed)
Physical Therapy Treatment Patient Details Name: Patrick [name] MRN: [id number] DOB: 1966/01/28 Today's Date: 08/30/2020    History of Present Illness 55 yo admitted 7/8 with sepsis due to Rt foot. 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 Pt with c/o nausea and not feeling well. Reports he ordered his breakfast this AM but it never arrived. RN notified and aware. Pt required min guard assist bed mobility and min assist lateral scoot transfer bed to drop-arm recliner. Pt reporting pain R groin, stating he pulled a muscle. Session limited by pt not feel well. Unable to address standing attempts or RLE exercises. Pt reports he will complete exercises later today. Pt in recliner, feet elevated at end of session.    Follow Up Recommendations  CIR     Equipment Recommendations  Wheelchair (measurements PT);Wheelchair cushion (measurements PT);Rolling walker with 5" wheels;3in1 (PT)    Recommendations for Other Services       Precautions / Restrictions Precautions Precautions: Fall Restrictions RLE Weight Bearing: Non weight bearing Other Position/Activity Restrictions: Per ortho note 7/11, d/c knee immobilizer    Mobility  Bed Mobility Overal bed mobility: Needs Assistance Bed Mobility: Supine to Sit     Supine to sit: Min guard;HOB elevated     General bed mobility comments: min guard for safety. pt moving slower due to not feeling well.    Transfers Overall transfer level: Needs assistance   Transfers: Lateral/Scoot Transfers          Lateral/Scoot Transfers: Min assist General transfer comment: lateral scoot bed to drop-arm recliner toward L. Increased time. Cues for sequencing.  Ambulation/Gait                 Stairs             Wheelchair Mobility    Modified Rankin (Stroke Patients Only)       Balance                                             Cognition Arousal/Alertness: Awake/alert Behavior During Therapy: WFL for tasks assessed/performed;Flat affect Overall Cognitive Status: Within Functional Limits for tasks assessed                                        Exercises      General Comments        Pertinent Vitals/Pain Pain Assessment: Faces Faces Pain Scale: Hurts even more Pain Location: R groin Pain Descriptors / Indicators: Throbbing;Discomfort;Grimacing;Guarding Pain Intervention(s): Limited activity within patient's tolerance;Monitored during session;Repositioned    Home Living                      Prior Function            PT Goals (current goals can now be found in the care plan section) Acute Rehab PT Goals Patient Stated Goal: fish, walk with a prosthesis Progress towards PT goals: Progressing toward goals    Frequency    Min 4X/week      PT Plan Current plan remains appropriate    Co-evaluation              AM-PAC PT "  6 Clicks" Mobility   Outcome Measure  Help needed turning from your back to your side while in a flat bed without using bedrails?: A Little Help needed moving from lying on your back to sitting on the side of a flat bed without using bedrails?: A Little Help needed moving to and from a bed to a chair (including a wheelchair)?: A Little Help needed standing up from a chair using your arms (e.g., wheelchair or bedside chair)?: A Lot Help needed to walk in hospital room?: Total Help needed climbing 3-5 steps with a railing? : Total 6 Click Score: 13    End of Session   Activity Tolerance: Patient limited by fatigue;Patient limited by pain Patient left: in chair;with call bell/phone within reach;with chair alarm set Nurse Communication: Mobility status;Other (comment) (breakfast tray never arrived) PT Visit Diagnosis: Other abnormalities of gait and mobility (R26.89);Difficulty in walking, not elsewhere classified (R26.2)     Time:  [id number] PT Time Calculation (min) (ACUTE ONLY): 16 min  Charges:  $Therapeutic Activity: 8-22 mins                     Patrick [name], PT  Office # [phone] Pager [phone]    Patrick [name] 08/30/2020, 12:05 PM

## 2020-08-30 NOTE — Progress Notes (Signed)
Orthopedic Tech Progress Note Patient Details:  Patrick [name] 06-16-1965 [id number]  Called in order to [employer] for a BKA STUMP Leonie Man STOCKING/SOCK  Patient ID: Patrick [name], male   DOB: September 26, 1965, 55 y.o.   MRN: [id number]  Janit Pagan 08/30/2020, 2:32 PM

## 2020-08-31 ENCOUNTER — Inpatient Hospital Stay (HOSPITAL_COMMUNITY): Payer: 59

## 2020-08-31 LAB — GLUCOSE, CAPILLARY
Glucose-Capillary: 67 mg/dL — ABNORMAL LOW (ref 70–99)
Glucose-Capillary: 83 mg/dL (ref 70–99)
Glucose-Capillary: 114 mg/dL — ABNORMAL HIGH (ref 70–99)
Glucose-Capillary: 127 mg/dL — ABNORMAL HIGH (ref 70–99)
Glucose-Capillary: 140 mg/dL — ABNORMAL HIGH (ref 70–99)

## 2020-08-31 LAB — BASIC METABOLIC PANEL
Anion gap: 10 (ref 5–15)
BUN: 20 mg/dL (ref 6–20)
CO2: 20 mmol/L — ABNORMAL LOW (ref 22–32)
Calcium: 8.6 mg/dL — ABNORMAL LOW (ref 8.9–10.3)
Chloride: 105 mmol/L (ref 98–111)
Creatinine, Ser: 1.42 mg/dL — ABNORMAL HIGH (ref 0.61–1.24)
GFR, Estimated: 59 mL/min — ABNORMAL LOW (ref >60)
Glucose, Bld: 99 mg/dL (ref 70–99)
Potassium: 4.2 mmol/L (ref 3.5–5.1)
Sodium: 135 mmol/L (ref 135–145)

## 2020-08-31 LAB — CULTURE, BLOOD (ROUTINE X 2)
Culture: NO GROWTH
Special Requests: ADEQUATE

## 2020-08-31 LAB — CBC
HCT: 24.7 % — ABNORMAL LOW (ref 39.0–52.0)
Hemoglobin: 7.7 g/dL — ABNORMAL LOW (ref 13.0–17.0)
MCH: 26 pg (ref 26.0–34.0)
MCHC: 31.2 g/dL (ref 30.0–36.0)
MCV: 83.4 fL (ref 80.0–100.0)
Platelets: 550 10*3/uL — ABNORMAL HIGH (ref 150–400)
RBC: 2.96 MIL/uL — ABNORMAL LOW (ref 4.22–5.81)
RDW: 17.3 % — ABNORMAL HIGH (ref 11.5–15.5)
WBC: 13.1 10*3/uL — ABNORMAL HIGH (ref 4.0–10.5)
nRBC: 0 % (ref 0.0–0.2)

## 2020-08-31 MED ORDER — INSULIN GLARGINE 100 UNIT/ML ~~LOC~~ SOLN
25.0000 [IU] | Freq: Two times a day (BID) | SUBCUTANEOUS | Status: DC
  Administered 2020-08-31 – 2020-09-01 (×2): 25 [IU] via SUBCUTANEOUS
  Filled 2020-08-31 (×4): qty 0.25

## 2020-08-31 MED ORDER — INSULIN GLARGINE 100 UNIT/ML ~~LOC~~ SOLN
12.0000 [IU] | Freq: Once | SUBCUTANEOUS | Status: AC
  Administered 2020-08-31: 12 [IU] via SUBCUTANEOUS
  Filled 2020-08-31: qty 0.12

## 2020-08-31 MED ORDER — LINEZOLID 600 MG PO TABS
600.0000 mg | ORAL_TABLET | Freq: Two times a day (BID) | ORAL | Status: DC
  Administered 2020-08-31 – 2020-09-01 (×3): 600 mg via ORAL
  Filled 2020-08-31 (×4): qty 1

## 2020-08-31 NOTE — Progress Notes (Signed)
Physical Therapy Treatment Patient Details Name: Patrick [name] MRN: [id number] DOB: 1965-10-31 Today's Date: 08/31/2020    History of Present Illness 55 yo admitted 7/8 with sepsis due to Rt foot. 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 He reports he injured it prior to surgery and feels it has been aggravated. He required min assist supine to sit and min assist lateral scoot transfer bed to recliner. Pt declining standing attempt in stedy due to pain. RLE exercises performed. Unable to perform R SLR due to pain. Pt in recliner with feet elevated at end of session. Hot pack provided for R groin.    Follow Up Recommendations  CIR     Equipment Recommendations  Wheelchair (measurements PT);Wheelchair cushion (measurements PT);Rolling walker with 5" wheels;3in1 (PT)    Recommendations for Other Services       Precautions / Restrictions Precautions Precautions: Fall Restrictions RLE Weight Bearing: Non weight bearing    Mobility  Bed Mobility Overal bed mobility: Needs Assistance Bed Mobility: Supine to Sit     Supine to sit: Min assist;HOB elevated     General bed mobility comments: assist to elevate trunk due to R groin pain    Transfers Overall transfer level: Needs assistance   Transfers: Lateral/Scoot Transfers          Lateral/Scoot Transfers: Min assist General transfer comment: lateral scoot bed to drop-arm recliner toward L. Increased time. Cues for sequencing.  Ambulation/Gait                 Stairs             Wheelchair Mobility    Modified Rankin (Stroke Patients Only)       Balance   Sitting-balance support: No upper extremity supported Sitting balance-Leahy Scale: Good                                      Cognition Arousal/Alertness: Awake/alert Behavior During Therapy: WFL for tasks assessed/performed;Flat  affect Overall Cognitive Status: Within Functional Limits for tasks assessed                                        Exercises Amputee Exercises Quad Sets: AROM;Right;10 reps;Supine Hip ABduction/ADduction: AROM;Right;10 reps;Supine Knee Flexion: AROM;Right;10 reps;Supine    General Comments        Pertinent Vitals/Pain Pain Assessment: Faces Faces Pain Scale: Hurts whole lot Pain Location: R groin Pain Descriptors / Indicators: Throbbing;Discomfort;Grimacing;Guarding Pain Intervention(s): Limited activity within patient's tolerance;Repositioned;Heat applied    Home Living                      Prior Function            PT Goals (current goals can now be found in the care plan section) Acute Rehab PT Goals Patient Stated Goal: fish, walk with a prosthesis Progress towards PT goals: Progressing toward goals    Frequency    Min 4X/week      PT Plan Current plan remains appropriate    Co-evaluation              AM-PAC PT "6 Clicks" Mobility   Outcome Measure  Help needed turning from your back to your side while in a flat bed without using bedrails?: A Little Help needed moving from lying on your back to sitting on the side of a flat bed without using bedrails?: A Little Help needed moving to and from a bed to a chair (including a wheelchair)?: A Little Help needed standing up from a chair using your arms (e.g., wheelchair or bedside chair)?: A Lot Help needed to walk in hospital room?: Total Help needed climbing 3-5 steps with a railing? : Total 6 Click Score: 13    End of Session   Activity Tolerance: Patient limited by pain Patient left: in chair;with call bell/phone within reach;with chair alarm set Nurse Communication: Mobility status PT Visit Diagnosis: Other abnormalities of gait and mobility (R26.89);Difficulty in walking, not elsewhere classified (R26.2)     Time: [id number] PT Time Calculation (min) (ACUTE ONLY): 26  min  Charges:  $Therapeutic Exercise: 8-22 mins $Therapeutic Activity: 8-22 mins                     Lorrin Goodell, PT  Office # [phone] Pager [phone]    Lorriane Shire 08/31/2020, 9:52 AM

## 2020-08-31 NOTE — Plan of Care (Signed)
  Problem: Clinical Measurements: Goal: Respiratory complications will improve Outcome: Progressing   Problem: Activity: Goal: Risk for activity intolerance will decrease Outcome: Progressing   Problem: Nutrition: Goal: Adequate nutrition will be maintained Outcome: Progressing   Problem: Elimination: Goal: Will not experience complications related to urinary retention Outcome: Progressing

## 2020-08-31 NOTE — Progress Notes (Signed)
PROGRESS NOTE    Patrick [name]   [id number]  DOB: 27-Jun-1965  PCP: [facility]    DOA: 08/26/2020 LOS: 4   Assessment & Plan   Principal Problem:   Osteomyelitis of right foot ([place]) Active Problems:   Insulin-requiring or dependent type II diabetes mellitus ([place])   HYPERTENSION, BENIGN ESSENTIAL   Sepsis due to cellulitis [facility])   Diabetic foot ulcer ([place])   Diabetic foot infection ([place])   CKD (chronic kidney disease) stage 3, GFR 30-59 ml/min (HCC)   Anemia   Sepsis present on admission/Osteomyelitis, abscess, ulceration /right diabetic foot -With tachycardia, tachypnea, low temp 100.1, WBC 14, right foot infection -Started on empiric vanc, Rocephin, Flagyl on admission -Blood culture obtained from ED 1/4 grew staph -Urine culture +1000 colonies of enterococcus faecium -past wound culture from 05/2020 + staph and enterococcus -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, ortho Dr Louanne Skye input appreciated -per ortho communication with pharmacy need  abx coverage for possible cellulitis due to "There was a zone of edema and soft tissue sign of reactive edema so treat like cellulitis" -d/c'd vanc/cefepime/flagyl -abx changed to ampicillin>>amoxicillin for 5 days -F/u on final culture result SEE Ortho Recommendations     Fever - AM 7/13 temp 101.5 F.  Repeat blood cultures and CXR ordered.  Prior urine culture from admission with VRE, had not been covered as patient was not having any symptoms.  Given new fever despite what should be adequate cellulitis coverage, will start Zyvox for VRE in urine. -follow repeat Blood cx  Acute on chronic normocytic anemia -hgb in 2021 was around 11-12 -hgb this year around 8-9 -hgb on presentation was 6 -s/p right bka has  hemovac on 7/11 - Hbg 6.5 - transfused 1 unit pRBC's -s/p transfusion of total 4 units pRBC's Hbg stable and improved today 7.7   FOBT + Per RN stool is brown with streaks of blood - consistent with likely hemorrhoids. No ab pain,  no n/v. No BM's since admission yet. Outpatient GI follow up  vs consult Monitor Hbg   Recent history of C. Difficile in 06/2020 Per chart review, treated with PO vanc. Not had BM in days, no longer active infection. D/C precautions and repeat test ordered on admission Started on prophylaxis dose oral vanc '125mg'$  bid    Right Elbow pain Hx of Gout - per Ortho, started on Colcrys.  Follow up uric acid.    Insulin-dependent type 2 diabetes, uncontrolled with hyperglycemia -A1c at 7.8% uncontrolled  -On Lantus, Novolog meatime + SSI -adjust insulin for inpatient goal 140-180  AKI on CKD stage IIIb - Cr near baseline, improved. Renally dose meds.  [place].     Obesity: Body mass index is 38.27 kg/m.  Complicates overall care and prognosis.  Recommend lifestyle modifications including physical activity and diet for weight loss and overall long-term health.   DVT prophylaxis: enoxaparin (LOVENOX) injection 40 mg Start: 08/28/20 0800 SCD's Start: 08/27/20 2341   Diet:  Diet Orders (From admission, onward)     Start     Ordered   08/27/20 2341  Diet Carb Modified Fluid consistency: Thin; Room service appropriate? Yes  Diet effective now       Question Answer Comment  Calorie Level Medium 1600-2000   Fluid consistency: Thin   Room service appropriate? Yes      08/27/20 2341              Code Status: Full Code   Brief Narrative / Hospital Course  to Date:   History of hypertension, insulin-dependent type 2 diabetes, recent history of C. difficile ,present with right diabetic foot infection, found to have sepsis, acute anemia requiring transfusion.  Taken for right BKA by Dr. Louanne Skye on 08/27/20. Therapy recommending CIR, insurance authorization pending.  Subjective 08/31/20    Pt had a fever this AM.  It resolved and when seen on rounds patient reports feeling fine.  Having some increased pain after transfer to bedside commode.  He denies any urinary symptoms including  dysuria or freuqency.  No other acute comlpaints.    Disposition Plan & Communication   Status is: Inpatient  Remains inpatient appropriate because: Anemia requiring repeat transfusion today.  Also rehab placement pending  Dispo: The patient is from: Home              Anticipated d/c is to: CIR              Patient currently is medically stable to d/c.   Difficult to place patient No    Consults, Procedures, Significant Events   Consultants:  Orthopedic surgery, Dr. Louanne Skye  Procedures:  Right BKA   Antimicrobials:  Anti-infectives (From admission, onward)    Start     Dose/Rate Route Frequency Ordered Stop   08/31/20 1015  linezolid (ZYVOX) tablet 600 mg        600 mg Oral Every 12 hours 08/31/20 0915     08/29/20 0600  amoxicillin (AMOXIL) capsule 500 mg        500 mg Oral Every 8 hours 08/28/20 1656 09/02/20 0559   08/28/20 1445  vancomycin (VANCOCIN) capsule 125 mg        125 mg Oral 2 times daily 08/28/20 1359     08/28/20 0000  vancomycin (VANCOREADY) IVPB 1500 mg/300 mL  Status:  Discontinued        1,500 mg 150 mL/hr over 120 Minutes Intravenous Every 24 hours 08/27/20 0320 08/28/20 1656   08/27/20 0600  cefTRIAXone (ROCEPHIN) 2 g in sodium chloride 0.9 % 100 mL IVPB  Status:  Discontinued        2 g 200 mL/hr over 30 Minutes Intravenous Every 24 hours 08/27/20 0256 08/28/20 1656   08/27/20 0400  metroNIDAZOLE (FLAGYL) IVPB 500 mg  Status:  Discontinued        500 mg 100 mL/hr over 60 Minutes Intravenous Every 8 hours 08/27/20 0256 08/28/20 1656   08/26/20 2330  vancomycin (VANCOCIN) 2,500 mg in sodium chloride 0.9 % 500 mL IVPB        2,500 mg 250 mL/hr over 120 Minutes Intravenous  Once 08/26/20 2321 08/27/20 0415   08/26/20 2330  cefTRIAXone (ROCEPHIN) 2 g in sodium chloride 0.9 % 100 mL IVPB        2 g 200 mL/hr over 30 Minutes Intravenous  Once 08/26/20 2321 08/27/20 0120         Micro    Objective   Vitals:   08/31/20 0500 08/31/20 0530 08/31/20  0929 08/31/20 1100  BP: 136/77  121/86 128/86  Pulse: 93  94 91  Resp: 20   15  Temp:  (!) 101.4 F (38.6 C)  98.1 F (36.7 C)  TempSrc:  Oral  Oral  SpO2: 97%     Weight:      Height:        Intake/Output Summary (Last 24 hours) at 08/31/2020 1710 Last data filed at 08/31/2020 0700 Gross per 24 hour  Intake 240 ml  Output 350 ml  Net -110 ml   Filed Weights   08/26/20 2142 08/27/20 2130  Weight: 131.5 kg (!) 142.6 kg    Physical Exam:  General exam: awake, alert, on BSC, no acute distress Respiratory system: CTAB, normal respiratory effort, on room air. Cardiovascular system: normal S1/S2, RRR Central nervous system: A&O x3. no gross focal neurologic deficits, normal speech Extremities: Right BKA dressing intact, no edema, normal tone   Labs   Data Reviewed: I have personally reviewed following labs and imaging studies  CBC: Recent Labs  Lab 08/26/20 2325 08/27/20 1024 08/27/20 1955 08/28/20 0051 08/29/20 0157 08/30/20 0022 08/31/20 0308  WBC 14.0* 14.4*  --  16.5* 17.2* 13.9* 13.1*  NEUTROABS 10.3*  --   --   --   --   --   --   HGB 6.0* 8.3* 7.5* 7.2* 6.5* 7.7* 7.7*  HCT 21.0* 27.3* 22.0* 24.0* 21.0* 24.2* 24.7*  MCV 84.0 82.2  --  83.0 82.7 82.0 83.4  PLT 572* 588*  --  524* 520* 543* [id number]*   Basic Metabolic Panel: Recent Labs  Lab 08/27/20 1024 08/27/20 1955 08/28/20 0051 08/29/20 0157 08/30/20 0022 08/31/20 0308  NA 136 138 135 135 137 135  K 3.8 4.4 4.8 4.4 4.3 4.2  CL 108  --  108 108 110 105  CO2 18*  --  16* 19* 21* 20*  GLUCOSE 143*  --  253* 260* 138* 99  BUN 30*  --  26* 28* 20 20  CREATININE 1.96*  --  1.70* 1.88* 1.48* 1.42*  CALCIUM 9.0  --  8.6* 8.4* 8.5* 8.6*   GFR: Estimated Creatinine Clearance: 92.8 mL/min (A) (by C-G formula based on SCr of 1.42 mg/dL (H)). Liver Function Tests: Recent Labs  Lab 08/26/20 2205 08/29/20 0157  AST 59* 16  ALT 81* 36  ALKPHOS 184* 120  BILITOT 0.7 0.6  PROT 7.1 5.9*  ALBUMIN 2.4* 2.1*    No results for input(s): LIPASE, AMYLASE in the last 168 hours. No results for input(s): AMMONIA in the last 168 hours. Coagulation Profile: Recent Labs  Lab 08/26/20 2312  INR 1.3*   Cardiac Enzymes: No results for input(s): CKTOTAL, CKMB, CKMBINDEX, TROPONINI in the last 168 hours. BNP (last 3 results) No results for input(s): PROBNP in the last 8760 hours. HbA1C: No results for input(s): HGBA1C in the last 72 hours.  CBG: Recent Labs  Lab 08/30/20 2115 08/31/20 0752 08/31/20 0841 08/31/20 1110 08/31/20 1605  GLUCAP 89 67* 83 114* 127*   Lipid Profile: No results for input(s): CHOL, HDL, LDLCALC, TRIG, CHOLHDL, LDLDIRECT in the last 72 hours. Thyroid Function Tests: No results for input(s): TSH, T4TOTAL, FREET4, T3FREE, THYROIDAB in the last 72 hours. Anemia Panel: No results for input(s): VITAMINB12, FOLATE, FERRITIN, TIBC, IRON, RETICCTPCT in the last 72 hours. Sepsis Labs: Recent Labs  Lab 08/26/20 2205 08/26/20 2346  LATICACIDVEN 1.8 1.3    Recent Results (from the past 240 hour(s))  Resp Panel by RT-PCR (Flu A&B, Covid) Nasopharyngeal Swab     Status: None   Collection Time: 08/26/20  9:46 PM   Specimen: Nasopharyngeal Swab; Nasopharyngeal(NP) swabs in vial transport medium  Result Value Ref Range Status   SARS Coronavirus 2 by RT PCR NEGATIVE NEGATIVE Final    Comment: (NOTE) SARS-CoV-2 target nucleic acids are NOT DETECTED.  The SARS-CoV-2 RNA is generally detectable in upper respiratory specimens during the acute phase of infection. The lowest concentration of SARS-CoV-2 viral copies this assay can detect is 138  copies/mL. A negative result does not preclude SARS-Cov-2 infection and should not be used as the sole basis for treatment or other patient management decisions. A negative result may occur with  improper specimen collection/handling, submission of specimen other than nasopharyngeal swab, presence of viral mutation(s) within the areas  targeted by this assay, and inadequate number of viral copies(<138 copies/mL). A negative result must be combined with clinical observations, patient history, and epidemiological information. The expected result is Negative.  Fact Sheet for Patients:  EntrepreneurPulse.com.au  Fact Sheet for Healthcare Providers:  IncredibleEmployment.be  This test is no t yet approved or cleared by the [place] FDA and  has been authorized for detection and/or diagnosis of SARS-CoV-2 by FDA under an Emergency Use Authorization (EUA). This EUA will remain  in effect (meaning this test can be used) for the duration of the COVID-19 declaration under Section 564(b)(1) of the Act, 21 U.S.C.section 360bbb-3(b)(1), unless the authorization is terminated  or revoked sooner.       Influenza A by PCR NEGATIVE NEGATIVE Final   Influenza B by PCR NEGATIVE NEGATIVE Final    Comment: (NOTE) The Xpert Xpress SARS-CoV-2/FLU/RSV plus assay is intended as an aid in the diagnosis of influenza from Nasopharyngeal swab specimens and should not be used as a sole basis for treatment. Nasal washings and aspirates are unacceptable for Xpert Xpress SARS-CoV-2/FLU/RSV testing.  Fact Sheet for Patients: EntrepreneurPulse.com.au  Fact Sheet for Healthcare Providers: IncredibleEmployment.be  This test is not yet approved or cleared by the [place] FDA and has been authorized for detection and/or diagnosis of SARS-CoV-2 by FDA under an Emergency Use Authorization (EUA). This EUA will remain in effect (meaning this test can be used) for the duration of the COVID-19 declaration under Section 564(b)(1) of the Act, 21 U.S.C. section 360bbb-3(b)(1), unless the authorization is terminated or revoked.  Performed at [facility], [place] [address], [place], [place] [address]   Urine culture     Status: Abnormal   Collection Time: 08/26/20   9:46 PM   Specimen: In/Out Cath Urine  Result Value Ref Range Status   Specimen Description IN/OUT CATH URINE  Final   Special Requests   Final    NONE Performed at [facility], [place] [address] [place], [place] [address]    Culture (A)  Final    1,000 COLONIES/mL VANCOMYCIN RESISTANT ENTEROCOCCUS   Report Status 08/30/2020 FINAL  Final   Organism ID, Bacteria VANCOMYCIN RESISTANT ENTEROCOCCUS (A)  Final      Susceptibility   Vancomycin resistant enterococcus - MIC*    AMPICILLIN >=32 RESISTANT Resistant     NITROFURANTOIN 32 SENSITIVE Sensitive     VANCOMYCIN >=32 RESISTANT Resistant     LINEZOLID 2 SENSITIVE Sensitive     * 1,000 COLONIES/mL VANCOMYCIN RESISTANT ENTEROCOCCUS  Blood Culture (routine x 2)     Status: Abnormal   Collection Time: 08/26/20 10:05 PM   Specimen: BLOOD RIGHT ARM  Result Value Ref Range Status   Specimen Description BLOOD RIGHT ARM  Final   Special Requests   Final    BOTTLES DRAWN AEROBIC AND ANAEROBIC Blood Culture adequate volume   Culture  Setup Time   Final    GRAM POSITIVE COCCI IN CLUSTERS AEROBIC BOTTLE ONLY CRITICAL RESULT CALLED TO, READ BACK BY AND VERIFIED WITH: PHARMD LAURA SEAY BY MESSAN H. AT [id number] ON 7  10 2022    Culture (A)  Final    STAPHYLOCOCCUS HOMINIS THE SIGNIFICANCE OF  ISOLATING THIS ORGANISM FROM A SINGLE SET OF BLOOD CULTURES WHEN MULTIPLE SETS ARE DRAWN IS UNCERTAIN. PLEASE NOTIFY THE MICROBIOLOGY DEPARTMENT WITHIN ONE WEEK IF SPECIATION AND SENSITIVITIES ARE REQUIRED. Performed at [facility], [place] [address], [place], [place] [address]    Report Status 08/29/2020 FINAL  Final  Blood Culture ID Panel (Reflexed)     Status: Abnormal   Collection Time: 08/26/20 10:05 PM  Result Value Ref Range Status   Enterococcus faecalis NOT DETECTED NOT DETECTED Final   Enterococcus Faecium NOT DETECTED NOT DETECTED Final   Listeria monocytogenes NOT DETECTED NOT DETECTED Final   Staphylococcus species DETECTED (A) NOT  DETECTED Final    Comment: CRITICAL RESULT CALLED TO, READ BACK BY AND VERIFIED WITH: PHARMD LAURA SEAY BY MESSAN H. AT [id number] ON 7  10 2022    Staphylococcus aureus (BCID) NOT DETECTED NOT DETECTED Final   Staphylococcus epidermidis NOT DETECTED NOT DETECTED Final   Staphylococcus lugdunensis NOT DETECTED NOT DETECTED Final   Streptococcus species NOT DETECTED NOT DETECTED Final   Streptococcus agalactiae NOT DETECTED NOT DETECTED Final   Streptococcus pneumoniae NOT DETECTED NOT DETECTED Final   Streptococcus pyogenes NOT DETECTED NOT DETECTED Final   A.calcoaceticus-baumannii NOT DETECTED NOT DETECTED Final   Bacteroides fragilis NOT DETECTED NOT DETECTED Final   Enterobacterales NOT DETECTED NOT DETECTED Final   Enterobacter cloacae complex NOT DETECTED NOT DETECTED Final   Escherichia coli NOT DETECTED NOT DETECTED Final   Klebsiella aerogenes NOT DETECTED NOT DETECTED Final   Klebsiella oxytoca NOT DETECTED NOT DETECTED Final   Klebsiella pneumoniae NOT DETECTED NOT DETECTED Final   Proteus species NOT DETECTED NOT DETECTED Final   Salmonella species NOT DETECTED NOT DETECTED Final   Serratia marcescens NOT DETECTED NOT DETECTED Final   Haemophilus influenzae NOT DETECTED NOT DETECTED Final   Neisseria meningitidis NOT DETECTED NOT DETECTED Final   Pseudomonas aeruginosa NOT DETECTED NOT DETECTED Final   Stenotrophomonas maltophilia NOT DETECTED NOT DETECTED Final   Candida albicans NOT DETECTED NOT DETECTED Final   Candida auris NOT DETECTED NOT DETECTED Final   Candida glabrata NOT DETECTED NOT DETECTED Final   Candida krusei NOT DETECTED NOT DETECTED Final   Candida parapsilosis NOT DETECTED NOT DETECTED Final   Candida tropicalis NOT DETECTED NOT DETECTED Final   Cryptococcus neoformans/gattii NOT DETECTED NOT DETECTED Final    Comment: Performed at [facility], [place]. [address], [place], [place] [address]  Blood Culture (routine x 2)     Status: None   Collection  Time: 08/26/20 10:06 PM   Specimen: BLOOD LEFT ARM  Result Value Ref Range Status   Specimen Description BLOOD LEFT ARM  Final   Special Requests   Final    BOTTLES DRAWN AEROBIC ONLY Blood Culture adequate volume   Culture   Final    NO GROWTH 5 DAYS Performed at [facility], [place] [address], [place], [place] [address]    Report Status 08/31/2020 FINAL  Final      Imaging Studies   DG CHEST PORT 1 VIEW  Result Date: 08/31/2020 CLINICAL DATA:  Fever EXAM: PORTABLE CHEST 1 VIEW COMPARISON:  08/26/2020 FINDINGS: The heart size and mediastinal contours are stable. No focal airspace consolidation, pleural effusion, or pneumothorax. The visualized skeletal structures are unremarkable. IMPRESSION: No active disease. Electronically Signed   By: Davina Poke D.O.   On: 08/31/2020 12:40     Medications   Scheduled Meds:  sodium chloride  Intravenous Once   allopurinol  100 mg Oral BID   amoxicillin  500 mg Oral Q8H   vitamin C  1,000 mg Oral Daily   colchicine  0.6 mg Oral BID   docusate sodium  100 mg Oral Daily   enoxaparin (LOVENOX) injection  40 mg Subcutaneous Q24H   ferrous sulfate  325 mg Oral TID PC   gabapentin  300 mg Oral TID   insulin aspart  0-20 Units Subcutaneous TID WC   insulin aspart  0-5 Units Subcutaneous QHS   insulin aspart  4 Units Subcutaneous TID WC   insulin glargine  25 Units Subcutaneous BID   linezolid  600 mg Oral Q12H   metoprolol tartrate  12.5 mg Oral BID   multivitamin with minerals  1 tablet Oral Daily   pantoprazole  40 mg Oral Daily   polyethylene glycol  17 g Oral Daily   Ensure Max Protein  11 oz Oral BID   tiZANidine  4 mg Oral QHS   vancomycin  125 mg Oral BID   zinc sulfate  220 mg Oral Daily   Continuous Infusions:  chlorproMAZINE (THORAZINE) IV     magnesium sulfate bolus IVPB         LOS: 4 days    Time spent: 25 minutes with > 50% spent at bedside and in coordination of care     Ezekiel Slocumb, DO [facility]  08/31/2020, 5:10 PM      If 7PM-7AM, please contact night-coverage. How to contact the [facility] Attending or Consulting provider [place] or covering provider during after hours [place], for this patient?    Check the care team in [facility] and look for a) attending/consulting [facility] provider listed and b) the [facility] team listed Log into [email or web address] and use [place]'s universal password to access. If you do not have the password, please contact the hospital operator. Locate the [facility] provider you are looking for under [facility] and page to a number that you can be directly reached. If you still have difficulty reaching the provider, please page the [facility] (Director on Call) for the Hospitalists listed on amion for assistance.

## 2020-08-31 NOTE — Progress Notes (Signed)
Inpatient Rehabilitation Admissions Coordinator    Noted febrile. I will not pursue CIR admit today. I have notified acute team and MD.  Danne Baxter, RN, MSN Rehab Admissions Coordinator [phone] 08/31/2020 12:10 PM

## 2020-09-01 ENCOUNTER — Inpatient Hospital Stay (HOSPITAL_COMMUNITY)
Admission: RE | Admit: 2020-09-01 | Discharge: 2020-09-10 | DRG: 560 | Disposition: A | Discharge status: Home / Self Care | Payer: 59 | Source: Intra-hospital | Attending: Physical Medicine and Rehabilitation | Admitting: Physical Medicine and Rehabilitation

## 2020-09-01 ENCOUNTER — Encounter (HOSPITAL_COMMUNITY): Payer: Self-pay | Admitting: Physical Medicine and Rehabilitation

## 2020-09-01 ENCOUNTER — Other Ambulatory Visit: Payer: Self-pay

## 2020-09-01 DIAGNOSIS — D62 Acute posthemorrhagic anemia: Secondary | ICD-10-CM

## 2020-09-01 DIAGNOSIS — Z794 Long term (current) use of insulin: Secondary | ICD-10-CM | POA: Diagnosis not present

## 2020-09-01 DIAGNOSIS — Z79899 Other long term (current) drug therapy: Secondary | ICD-10-CM

## 2020-09-01 DIAGNOSIS — Z7984 Long term (current) use of oral hypoglycemic drugs: Secondary | ICD-10-CM | POA: Diagnosis not present

## 2020-09-01 DIAGNOSIS — R5381 Other malaise: Secondary | ICD-10-CM | POA: Diagnosis present

## 2020-09-01 DIAGNOSIS — E1142 Type 2 diabetes mellitus with diabetic polyneuropathy: Secondary | ICD-10-CM | POA: Diagnosis present

## 2020-09-01 DIAGNOSIS — U099 Post covid-19 condition, unspecified: Secondary | ICD-10-CM | POA: Diagnosis present

## 2020-09-01 DIAGNOSIS — Z6838 Body mass index (BMI) 38.0-38.9, adult: Secondary | ICD-10-CM

## 2020-09-01 DIAGNOSIS — E669 Obesity, unspecified: Secondary | ICD-10-CM | POA: Diagnosis present

## 2020-09-01 DIAGNOSIS — Z4781 Encounter for orthopedic aftercare following surgical amputation: Principal | ICD-10-CM

## 2020-09-01 DIAGNOSIS — M109 Gout, unspecified: Secondary | ICD-10-CM | POA: Diagnosis present

## 2020-09-01 DIAGNOSIS — Z1622 Resistance to vancomycin related antibiotics: Secondary | ICD-10-CM | POA: Diagnosis present

## 2020-09-01 DIAGNOSIS — R000 Tachycardia, unspecified: Secondary | ICD-10-CM | POA: Diagnosis present

## 2020-09-01 DIAGNOSIS — E1122 Type 2 diabetes mellitus with diabetic chronic kidney disease: Secondary | ICD-10-CM | POA: Diagnosis present

## 2020-09-01 DIAGNOSIS — N390 Urinary tract infection, site not specified: Secondary | ICD-10-CM | POA: Diagnosis present

## 2020-09-01 DIAGNOSIS — N1831 Chronic kidney disease, stage 3a: Secondary | ICD-10-CM | POA: Diagnosis not present

## 2020-09-01 DIAGNOSIS — E1151 Type 2 diabetes mellitus with diabetic peripheral angiopathy without gangrene: Secondary | ICD-10-CM | POA: Diagnosis present

## 2020-09-01 DIAGNOSIS — M1A9XX0 Chronic gout, unspecified, without tophus (tophi): Secondary | ICD-10-CM | POA: Diagnosis present

## 2020-09-01 DIAGNOSIS — I129 Hypertensive chronic kidney disease with stage 1 through stage 4 chronic kidney disease, or unspecified chronic kidney disease: Secondary | ICD-10-CM | POA: Diagnosis present

## 2020-09-01 DIAGNOSIS — N183 Chronic kidney disease, stage 3 unspecified: Secondary | ICD-10-CM | POA: Diagnosis present

## 2020-09-01 DIAGNOSIS — R053 Chronic cough: Secondary | ICD-10-CM | POA: Diagnosis present

## 2020-09-01 DIAGNOSIS — I739 Peripheral vascular disease, unspecified: Secondary | ICD-10-CM | POA: Diagnosis not present

## 2020-09-01 DIAGNOSIS — G546 Phantom limb syndrome with pain: Secondary | ICD-10-CM | POA: Diagnosis present

## 2020-09-01 DIAGNOSIS — Z89511 Acquired absence of right leg below knee: Secondary | ICD-10-CM | POA: Diagnosis present

## 2020-09-01 HISTORY — DX: Acute posthemorrhagic anemia: D62

## 2020-09-01 LAB — GLUCOSE, CAPILLARY
Glucose-Capillary: 104 mg/dL — ABNORMAL HIGH (ref 70–99)
Glucose-Capillary: 78 mg/dL (ref 70–99)
Glucose-Capillary: 93 mg/dL (ref 70–99)
Glucose-Capillary: 160 mg/dL — ABNORMAL HIGH (ref 70–99)

## 2020-09-01 LAB — CBC
HCT: 24.2 % — ABNORMAL LOW (ref 39.0–52.0)
Hemoglobin: 7.5 g/dL — ABNORMAL LOW (ref 13.0–17.0)
MCH: 25.9 pg — ABNORMAL LOW (ref 26.0–34.0)
MCHC: 31 g/dL (ref 30.0–36.0)
MCV: 83.4 fL (ref 80.0–100.0)
Platelets: 558 10*3/uL — ABNORMAL HIGH (ref 150–400)
RBC: 2.9 MIL/uL — ABNORMAL LOW (ref 4.22–5.81)
RDW: 17.2 % — ABNORMAL HIGH (ref 11.5–15.5)
WBC: 12.3 10*3/uL — ABNORMAL HIGH (ref 4.0–10.5)
nRBC: 0 % (ref 0.0–0.2)

## 2020-09-01 LAB — BASIC METABOLIC PANEL
Anion gap: 7 (ref 5–15)
BUN: 18 mg/dL (ref 6–20)
CO2: 22 mmol/L (ref 22–32)
Calcium: 8.5 mg/dL — ABNORMAL LOW (ref 8.9–10.3)
Chloride: 107 mmol/L (ref 98–111)
Creatinine, Ser: 1.47 mg/dL — ABNORMAL HIGH (ref 0.61–1.24)
GFR, Estimated: 57 mL/min — ABNORMAL LOW (ref >60)
Glucose, Bld: 118 mg/dL — ABNORMAL HIGH (ref 70–99)
Potassium: 4.2 mmol/L (ref 3.5–5.1)
Sodium: 136 mmol/L (ref 135–145)

## 2020-09-01 MED ORDER — OXYCODONE HCL 10 MG PO TABS: 10.0000 mg | ORAL_TABLET | ORAL | 0 refills | Status: DC | PRN

## 2020-09-01 MED ORDER — LINEZOLID 600 MG PO TABS
600.0000 mg | ORAL_TABLET | Freq: Two times a day (BID) | ORAL | Status: DC
  Administered 2020-09-01 – 2020-09-03 (×5): 600 mg via ORAL
  Filled 2020-09-01 (×6): qty 1

## 2020-09-01 MED ORDER — ALUM & MAG HYDROXIDE-SIMETH 200-200-20 MG/5ML PO SUSP: 15.0000 mL | ORAL | Status: DC | PRN

## 2020-09-01 MED ORDER — ACETAMINOPHEN 325 MG PO TABS: 650.0000 mg | ORAL_TABLET | Freq: Four times a day (QID) | ORAL | Status: DC | PRN

## 2020-09-01 MED ORDER — ACETAMINOPHEN 325 MG PO TABS: 325.0000 mg | ORAL_TABLET | Freq: Four times a day (QID) | ORAL | Status: DC | PRN

## 2020-09-01 MED ORDER — AMOXICILLIN 500 MG PO CAPS: 500.0000 mg | ORAL_CAPSULE | Freq: Three times a day (TID) | ORAL | 0 refills | Status: DC

## 2020-09-01 MED ORDER — INSULIN ASPART 100 UNIT/ML IJ SOLN
0.0000 [IU] | Freq: Three times a day (TID) | INTRAMUSCULAR | Status: DC
Start: 2020-09-01 — End: 2020-09-01

## 2020-09-01 MED ORDER — KETOROLAC TROMETHAMINE 10 MG PO TABS
10.0000 mg | ORAL_TABLET | Freq: Once | ORAL | Status: AC
  Administered 2020-09-01: 10 mg via ORAL
  Filled 2020-09-01: qty 1

## 2020-09-01 MED ORDER — ASCORBIC ACID 1000 MG PO TABS: 1000.0000 mg | ORAL_TABLET | Freq: Every day | ORAL | Status: DC

## 2020-09-01 MED ORDER — INSULIN ASPART 100 UNIT/ML IJ SOLN: 0.0000 [IU] | Freq: Three times a day (TID) | INTRAMUSCULAR | 11 refills | Status: DC

## 2020-09-01 MED ORDER — ENOXAPARIN SODIUM 40 MG/0.4ML IJ SOSY: 40.0000 mg | PREFILLED_SYRINGE | INTRAMUSCULAR | Status: DC

## 2020-09-01 MED ORDER — PANTOPRAZOLE SODIUM 40 MG PO TBEC
40.0000 mg | DELAYED_RELEASE_TABLET | Freq: Every day | ORAL | Status: DC
  Administered 2020-09-02 – 2020-09-10 (×9): 40 mg via ORAL
  Filled 2020-09-01 (×9): qty 1

## 2020-09-01 MED ORDER — BISACODYL 5 MG PO TBEC: 5.0000 mg | DELAYED_RELEASE_TABLET | Freq: Every day | ORAL | 0 refills | Status: DC | PRN

## 2020-09-01 MED ORDER — LINEZOLID 600 MG PO TABS: 600.0000 mg | ORAL_TABLET | Freq: Two times a day (BID) | ORAL | 0 refills | Status: DC

## 2020-09-01 MED ORDER — VANCOMYCIN HCL 125 MG PO CAPS: 125.0000 mg | ORAL_CAPSULE | Freq: Four times a day (QID) | ORAL | Status: DC

## 2020-09-01 MED ORDER — ASCORBIC ACID 500 MG PO TABS
1000.0000 mg | ORAL_TABLET | Freq: Every day | ORAL | Status: DC
  Administered 2020-09-02 – 2020-09-10 (×9): 1000 mg via ORAL
  Filled 2020-09-01 (×9): qty 2

## 2020-09-01 MED ORDER — SODIUM CHLORIDE 0.9 % IV SOLN
12.5000 mg | Freq: Four times a day (QID) | INTRAVENOUS | Status: DC | PRN
  Filled 2020-09-01: qty 0.5

## 2020-09-01 MED ORDER — ALLOPURINOL 100 MG PO TABS
100.0000 mg | ORAL_TABLET | Freq: Two times a day (BID) | ORAL | Status: DC
  Administered 2020-09-01 – 2020-09-10 (×18): 100 mg via ORAL
  Filled 2020-09-01 (×18): qty 1

## 2020-09-01 MED ORDER — ZINC SULFATE 220 (50 ZN) MG PO CAPS
220.0000 mg | ORAL_CAPSULE | Freq: Every day | ORAL | Status: AC
  Administered 2020-09-02 – 2020-09-10 (×9): 220 mg via ORAL
  Filled 2020-09-01 (×9): qty 1

## 2020-09-01 MED ORDER — ACETAMINOPHEN 650 MG RE SUPP: 650.0000 mg | Freq: Four times a day (QID) | RECTAL | Status: DC | PRN

## 2020-09-01 MED ORDER — BISACODYL 5 MG PO TBEC: 5.0000 mg | DELAYED_RELEASE_TABLET | Freq: Every day | ORAL | Status: DC | PRN

## 2020-09-01 MED ORDER — FERROUS SULFATE 325 (65 FE) MG PO TABS
325.0000 mg | ORAL_TABLET | Freq: Three times a day (TID) | ORAL | 3 refills | Status: DC
Start: 2020-09-01 — End: 2020-09-08

## 2020-09-01 MED ORDER — GUAIFENESIN 100 MG/5ML PO SOLN: 15.0000 mL | ORAL | Status: DC | PRN

## 2020-09-01 MED ORDER — INSULIN GLARGINE 100 UNIT/ML ~~LOC~~ SOLN: 25.0000 [IU] | Freq: Two times a day (BID) | SUBCUTANEOUS | 11 refills | Status: DC

## 2020-09-01 MED ORDER — ADULT MULTIVITAMIN W/MINERALS CH: 1.0000 | ORAL_TABLET | Freq: Every day | ORAL | Status: DC

## 2020-09-01 MED ORDER — ADULT MULTIVITAMIN W/MINERALS CH
1.0000 | ORAL_TABLET | Freq: Every day | ORAL | Status: DC
  Administered 2020-09-02 – 2020-09-10 (×9): 1 via ORAL
  Filled 2020-09-01 (×9): qty 1

## 2020-09-01 MED ORDER — AMOXICILLIN 500 MG PO CAPS
500.0000 mg | ORAL_CAPSULE | Freq: Three times a day (TID) | ORAL | Status: AC
  Administered 2020-09-01 – 2020-09-02 (×4): 500 mg via ORAL
  Filled 2020-09-01 (×4): qty 1

## 2020-09-01 MED ORDER — POLYETHYLENE GLYCOL 3350 17 G PO PACK: 17.0000 g | PACK | Freq: Every day | ORAL | 0 refills | Status: DC | PRN

## 2020-09-01 MED ORDER — ENOXAPARIN SODIUM 40 MG/0.4ML IJ SOSY
40.0000 mg | PREFILLED_SYRINGE | INTRAMUSCULAR | Status: DC
  Administered 2020-09-02 – 2020-09-10 (×9): 40 mg via SUBCUTANEOUS
  Filled 2020-09-01 (×9): qty 0.4

## 2020-09-01 MED ORDER — METOPROLOL TARTRATE 12.5 MG HALF TABLET
12.5000 mg | ORAL_TABLET | Freq: Two times a day (BID) | ORAL | Status: DC
  Administered 2020-09-01 – 2020-09-10 (×18): 12.5 mg via ORAL
  Filled 2020-09-01 (×18): qty 1

## 2020-09-01 MED ORDER — VANCOMYCIN HCL 125 MG PO CAPS
125.0000 mg | ORAL_CAPSULE | Freq: Two times a day (BID) | ORAL | Status: DC
  Administered 2020-09-01 – 2020-09-03 (×5): 125 mg via ORAL
  Filled 2020-09-01 (×6): qty 1

## 2020-09-01 MED ORDER — OXYCODONE HCL 5 MG PO TABS
10.0000 mg | ORAL_TABLET | ORAL | Status: DC | PRN
  Administered 2020-09-01 – 2020-09-02 (×4): 15 mg via ORAL
  Administered 2020-09-03 – 2020-09-04 (×3): 10 mg via ORAL
  Administered 2020-09-05 – 2020-09-08 (×3): 15 mg via ORAL
  Administered 2020-09-08: 10 mg via ORAL
  Administered 2020-09-09: 15 mg via ORAL
  Filled 2020-09-01: qty 2
  Filled 2020-09-01: qty 3
  Filled 2020-09-01 (×3): qty 2
  Filled 2020-09-01 (×7): qty 3

## 2020-09-01 MED ORDER — INSULIN ASPART 100 UNIT/ML IJ SOLN: 4.0000 [IU] | Freq: Three times a day (TID) | INTRAMUSCULAR | 11 refills | Status: DC

## 2020-09-01 MED ORDER — TIZANIDINE HCL 4 MG PO TABS
4.0000 mg | ORAL_TABLET | Freq: Every day | ORAL | Status: DC
  Administered 2020-09-01 – 2020-09-05 (×5): 4 mg via ORAL
  Filled 2020-09-01 (×6): qty 1

## 2020-09-01 MED ORDER — ZINC SULFATE 220 (50 ZN) MG PO CAPS: 220.0000 mg | ORAL_CAPSULE | Freq: Every day | ORAL | Status: DC

## 2020-09-01 MED ORDER — DOCUSATE SODIUM 100 MG PO CAPS
100.0000 mg | ORAL_CAPSULE | Freq: Every day | ORAL | Status: DC
  Administered 2020-09-02 – 2020-09-10 (×8): 100 mg via ORAL
  Filled 2020-09-01 (×9): qty 1

## 2020-09-01 MED ORDER — DOCUSATE SODIUM 100 MG PO CAPS: 100.0000 mg | ORAL_CAPSULE | Freq: Every day | ORAL | 0 refills | Status: DC

## 2020-09-01 MED ORDER — ENSURE MAX PROTEIN PO LIQD
11.0000 [oz_av] | Freq: Two times a day (BID) | ORAL | Status: DC
  Administered 2020-09-01 – 2020-09-09 (×16): 11 [oz_av] via ORAL
  Filled 2020-09-01 (×19): qty 330

## 2020-09-01 MED ORDER — ENSURE MAX PROTEIN PO LIQD: 11.0000 [oz_av] | Freq: Two times a day (BID) | ORAL | Status: DC

## 2020-09-01 MED ORDER — VANCOMYCIN HCL 125 MG PO CAPS: 125.0000 mg | ORAL_CAPSULE | Freq: Two times a day (BID) | ORAL | Status: DC

## 2020-09-01 MED ORDER — POLYETHYLENE GLYCOL 3350 17 G PO PACK
17.0000 g | PACK | Freq: Every day | ORAL | Status: DC
  Administered 2020-09-03 – 2020-09-10 (×7): 17 g via ORAL
  Filled 2020-09-01 (×9): qty 1

## 2020-09-01 MED ORDER — INSULIN GLARGINE 100 UNIT/ML ~~LOC~~ SOLN
25.0000 [IU] | Freq: Two times a day (BID) | SUBCUTANEOUS | Status: DC
  Administered 2020-09-01 – 2020-09-10 (×16): 25 [IU] via SUBCUTANEOUS
  Filled 2020-09-01 (×19): qty 0.25

## 2020-09-01 MED ORDER — METOPROLOL TARTRATE 25 MG PO TABS: 12.5000 mg | ORAL_TABLET | Freq: Two times a day (BID) | ORAL | Status: DC

## 2020-09-01 MED ORDER — INSULIN ASPART 100 UNIT/ML IJ SOLN
4.0000 [IU] | Freq: Three times a day (TID) | INTRAMUSCULAR | Status: DC
  Administered 2020-09-01 – 2020-09-10 (×20): 4 [IU] via SUBCUTANEOUS

## 2020-09-01 MED ORDER — DIPHENHYDRAMINE HCL 25 MG PO CAPS: 25.0000 mg | ORAL_CAPSULE | Freq: Every evening | ORAL | 0 refills | Status: DC | PRN

## 2020-09-01 MED ORDER — GABAPENTIN 300 MG PO CAPS
300.0000 mg | ORAL_CAPSULE | Freq: Three times a day (TID) | ORAL | Status: DC
  Administered 2020-09-01 – 2020-09-10 (×26): 300 mg via ORAL
  Filled 2020-09-01 (×26): qty 1

## 2020-09-01 MED ORDER — FERROUS SULFATE 325 (65 FE) MG PO TABS
325.0000 mg | ORAL_TABLET | Freq: Three times a day (TID) | ORAL | Status: DC
  Administered 2020-09-01 – 2020-09-10 (×26): 325 mg via ORAL
  Filled 2020-09-01 (×26): qty 1

## 2020-09-01 NOTE — H&P (Signed)
Physical Medicine and Rehabilitation Admission H&P         Chief Complaint  Patient presents with   Wound Infection      Right Foot  : HPI: Patrick [name] is a 55 year old right-handed male with history of diabetes mellitus, gout, chronic anemia with hemoglobin 8-9, obesity with BMI of 38.27, CKD stage III, hypertension, PVD with right fourth and fifth ray amputations 05/2020, recent COVID 04/2019 as well as a recent C. difficile 06/2020 treated with p.o. vancomycin.    Per chart review independent with assistive device prior to admission living with non relatives.  Level entry home with bed and bath upstairs.  Presented 08/26/2020 with low-grade fever/tachycardia as well as nonhealing right foot ulcer followed at the [facility] wound care clinic and has undergone multiple debridements.  Admission chemistry sodium 134, BUN 32, creatinine 2.27, WBC 14,000, hemoglobin 6.0, lactic acid 1.3.  Blood cultures obtained 1/4 grew staph, urine culture 1000 colony of Enterococcus.  Prior urine culture from admission with VRE and started on Zyvox.  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 Louanne Skye.  His Hemovac was discontinued 08/29/2020.  Initially maintained on broad-spectrum antibiotics simplified to amoxicillin completing course as directed.  Hospital course acute blood loss anemia 6.5 and has received a total of 4 units packed red blood cells with latest hemoglobin 7.5 as well as patient did have one positive Hemoccult stool and monitored.   Patient developed right elbow gout pain with uric acid 11.2 and placed on colchicine as well as Zyloprim.  He was cleared to begin Lovenox for DVT prophylaxis 08/28/2020.  In regards to patient's recent C. difficile cleared by infectious disease that he no longer needs contact precautions and would complete course of p.o. vancomycin.  Therapy evaluations completed due to patient's decreased functional mobility  was admitted for a comprehensive rehab program.   Pain comes and goes- pulled R groin again- hurting really bad with movement. Some phantom pain, but not bad- mainly residual limb pain- oxycodone and gabapentin help. Wants indomethicin as needed for groin/gout pain. Gout is somewhat better but still bothersome in R elbow.   LBM yesterday; peeing well - still has COVID cough.      Review of Systems Constitutional:  Positive for fever and malaise/fatigue. HENT:  Negative for hearing loss.   Eyes:  Negative for blurred vision and double vision. Respiratory:  Negative for cough and shortness of breath.   Cardiovascular:  Positive for leg swelling. Negative for chest pain and palpitations. Gastrointestinal:  Positive for blood in stool and constipation. Negative for heartburn and nausea. Genitourinary:  Negative for dysuria, flank pain and hematuria. Musculoskeletal:  Positive for joint pain and myalgias. Skin:  Negative for rash.  All other systems reviewed and are negative.     Past Medical History:  Diagnosis Date   Diabetes mellitus     Gout     History of COVID-19 04/2019   Hypertension           Past Surgical History:  Procedure Laterality Date   AMPUTATION Right 06/06/2020    Procedure: AMPUTATION 5TH RAY;  Surgeon: [name], MD;  Location: [place];  Service: Orthopedics;  Laterality: Right;   AMPUTATION Right 06/08/2020    Procedure: RIGHT 4TH RAY AMPUTATION;  Surgeon: [name], MD;  Location: [place];  Service: Orthopedics;  Laterality: Right;   AMPUTATION Right 08/27/2020  Procedure: AMPUTATION BELOW KNEE;  Surgeon: Jessy Oto, MD;  Location: [facility];  Service: Orthopedics;  Laterality: Right;         Family History  Problem Relation Age of Onset   Diabetes Neg Hx      Social History:  reports that he has never smoked. He has never used smokeless tobacco. He reports that he does not drink alcohol and does not use drugs. Allergies: No Known Allergies        Medications Prior to Admission  Medication Sig Dispense Refill   acetaminophen (TYLENOL) 325 MG tablet Take 650 mg by mouth every 6 (six) hours as needed for headache, fever or moderate pain.       allopurinol (ZYLOPRIM) 100 MG tablet Take 1 tablet (100 mg total) by mouth 2 (two) times daily. 60 tablet 3   amLODipine (NORVASC) 10 MG tablet Take 1 tablet (10 mg total) by mouth daily. 30 tablet 1   blood glucose meter kit and supplies Dispense based on patient and insurance preference. Use up to four times daily as directed. (FOR ICD-10 E10.9, E11.9). 1 each 0   gabapentin (NEURONTIN) 300 MG capsule Take 300 mg by mouth 3 (three) times daily.       indomethacin (INDOCIN) 25 MG capsule Take 50 mg by mouth 3 (three) times daily as needed for mild pain.       insulin glargine (LANTUS SOLOSTAR) 100 UNIT/ML Solostar Pen Inject 28-60 Units into the skin 2 (two) times daily. Inject 60 units in the morning and 28 units at bedtime (Patient taking differently: Inject 28-60 Units into the skin See admin instructions. Inject 60 units in the morning and 28 units at bedtime) 15 mL 1   insulin lispro (HUMALOG) 200 UNIT/ML KwikPen Inject 10 Units into the skin 3 (three) times daily before meals. 6 mL 3   Insulin Pen Needle 32G X 8 MM MISC Use as directed 100 each 1   losartan (COZAAR) 100 MG tablet Take 1 tablet (100 mg total) by mouth daily. 30 tablet 0   metFORMIN (GLUCOPHAGE) 500 MG tablet Take 1 tablet (500 mg total) by mouth 2 (two) times daily with a meal. 60 tablet 0   metoprolol tartrate (LOPRESSOR) 25 MG tablet Take 1 tablet (25 mg total) by mouth 2 (two) times daily. 60 tablet 0   oxyCODONE-acetaminophen (PERCOCET) 5-325 MG tablet Take 1 tablet by mouth every 4 (four) hours as needed. (Patient taking differently: Take 1 tablet by mouth every 4 (four) hours as needed for moderate pain.) 30 tablet 0   sodium hypochlorite (DAKIN'S 1/2 STRENGTH) external solution Apply 1 application topically See admin  instructions. Apply to gauze and apply to wound twice daily       tiZANidine (ZANAFLEX) 4 MG tablet Take 4 mg by mouth at bedtime.       pantoprazole (PROTONIX) 40 MG tablet Take 1 tablet (40 mg total) by mouth daily. (Patient not taking: Reported on 08/26/2020) 30 tablet 0   vancomycin (VANCOCIN) 125 MG capsule Take 125 mg by mouth 4 (four) times daily. (Patient not taking: No sig reported)          Drug Regimen Review Drug regimen was reviewed and remains appropriate with no significant issues identified   Home: Home Living Family/patient expects to be discharged to:: Private residence Living Arrangements: Non-relatives/Friends Available Help at Discharge: Friend(s), Available 24 hours/day Type of Home: House Home Access: Level entry Home Layout: Bed/bath upstairs, 1/2 bath on main level Bathroom  Shower/Tub: Chiropodist: Standard Home Equipment: Crutches Additional Comments: couch on main floor   Functional History: Prior Function Level of Independence: Independent with assistive device(s) Comments: using crutches since toe amputations but caring for himself   Functional Status:  Mobility: Bed Mobility Overal bed mobility: Needs Assistance Bed Mobility: Supine to Sit Supine to sit: Min assist, HOB elevated General bed mobility comments: assist to elevate trunk due to R groin pain Transfers Overall transfer level: Needs assistance Transfers: Lateral/Scoot Transfers Sit to Stand: Mod assist, From elevated surface  Lateral/Scoot Transfers: Min assist General transfer comment: lateral scoot bed to drop-arm recliner toward L. Increased time. Cues for sequencing. Ambulation/Gait General Gait Details: unable   ADL: ADL Overall ADL's : Needs assistance/impaired Eating/Feeding: Independent, Sitting Grooming: Independent, Sitting Upper Body Bathing: Min guard, Sitting Lower Body Bathing: Minimal assistance, Sitting/lateral leans Upper Body Dressing :  Independent, Sitting Lower Body Dressing: Minimal assistance, Sitting/lateral leans Toilet Transfer: Min guard, Transfer board Toileting- Water quality scientist and Hygiene: Min guard, Sitting/lateral lean Tub/ Shower Transfer: Min guard, Transfer board Functional mobility during ADLs: Min guard General ADL Comments: Pt requiring min guard to min A with most ADL's while sitting and leaning. Pt unable to stand at this time without max A.   Cognition: Cognition Overall Cognitive Status: Within Functional Limits for tasks assessed Orientation Level: Oriented X4 Cognition Arousal/Alertness: Awake/alert Behavior During Therapy: WFL for tasks assessed/performed, Flat affect Overall Cognitive Status: Within Functional Limits for tasks assessed   Physical Exam: Blood pressure (!) 154/87, pulse 91, temperature 98.8 F (37.1 C), temperature source Oral, resp. rate 17, height '6\' 4"'  (1.93 m), weight (!) 142.6 kg, SpO2 100 %. Physical Exam Vitals and nursing note reviewed. Constitutional:      Appearance: Normal appearance. He is obese.    Comments: Awake, alert, appropriate, sitting up in bed; - overweight, NAD  HENT:    Head: Normocephalic and atraumatic.    Right Ear: External ear normal.    Left Ear: External ear normal.    Nose: Nose normal. No congestion.    Mouth/Throat:    Mouth: Mucous membranes are dry.    Pharynx: Oropharynx is clear. No oropharyngeal exudate. Eyes:    General:        Right eye: No discharge.        Left eye: No discharge.    Extraocular Movements: Extraocular movements intact. Cardiovascular:    Rate and Rhythm: Normal rate and regular rhythm.    Heart sounds: Normal heart sounds. No murmur heard.   No gallop. Pulmonary:    Comments: CTA B/L- no W/R/R- good air movement COVID sounding occ cough Abdominal:    Comments: Soft, NT, ND, (+)BS      Musculoskeletal:    Cervical back: Normal range of motion and neck supple.    Comments: MS: UE strength 5/5  in biceps, triceps, WE, grip and finger abd B/L LE: RLE- HF 2.5 due to pain; KE/KF 4/5- R BKA noted LLE: 5/5 in HF, KE, KF DF and PF  Skin:    Comments: Right BKA dressed clean dry and intact with knee immobilizer in place Has almost a small "Y" in the middle of incision that is stapled- no drainage; moderate to significant edema/swelling- no sig, erythema; L foot/ankle 3-4+ edema- Edema to midcalf 3+ Bruising and venous stasis- on LLE  Neurological:    Mental Status: He is alert.    Comments: Patient is alert and oriented x3.  Follows commands. Decreased sensation- on LLE  Psychiatric:    Comments: Appropriate, full affect      Lab Results Last 48 Hours        Results for orders placed or performed during the hospital encounter of 08/26/20 (from the past 48 hour(s))  Glucose, capillary     Status: Abnormal    Collection Time: 08/30/20 11:48 AM  Result Value Ref Range    Glucose-Capillary 110 (H) 70 - 99 mg/dL      Comment: Glucose reference range applies only to samples taken after fasting for at least 8 hours.  Glucose, capillary     Status: Abnormal    Collection Time: 08/30/20  3:50 PM  Result Value Ref Range    Glucose-Capillary 122 (H) 70 - 99 mg/dL      Comment: Glucose reference range applies only to samples taken after fasting for at least 8 hours.  Glucose, capillary     Status: None    Collection Time: 08/30/20  9:15 PM  Result Value Ref Range    Glucose-Capillary 89 70 - 99 mg/dL      Comment: Glucose reference range applies only to samples taken after fasting for at least 8 hours.  CBC     Status: Abnormal    Collection Time: 08/31/20  3:08 AM  Result Value Ref Range    WBC 13.1 (H) 4.0 - 10.5 K/uL    RBC 2.96 (L) 4.22 - 5.81 MIL/uL    Hemoglobin 7.7 (L) 13.0 - 17.0 g/dL    HCT 24.7 (L) 39.0 - 52.0 %    MCV 83.4 80.0 - 100.0 fL    MCH 26.0 26.0 - 34.0 pg    MCHC 31.2 30.0 - 36.0 g/dL    RDW 17.3 (H) 11.5 - 15.5 %    Platelets 550 (H) 150 - 400 K/uL    nRBC  0.0 0.0 - 0.2 %      Comment: Performed at [facility] [address] [place], [place] [address]  Basic metabolic panel     Status: Abnormal    Collection Time: 08/31/20  3:08 AM  Result Value Ref Range    Sodium 135 135 - 145 mmol/L    Potassium 4.2 3.5 - 5.1 mmol/L    Chloride 105 98 - 111 mmol/L    CO2 20 (L) 22 - 32 mmol/L    Glucose, Bld 99 70 - 99 mg/dL      Comment: Glucose reference range applies only to samples taken after fasting for at least 8 hours.    BUN 20 6 - 20 mg/dL    Creatinine, Ser 1.42 (H) 0.61 - 1.24 mg/dL    Calcium 8.6 (L) 8.9 - 10.3 mg/dL    GFR, Estimated 59 (L) >60 mL/min      Comment: (NOTE) Calculated using the CKD-EPI Creatinine Equation (2021)      Anion gap 10 5 - 15      Comment: Performed at [facility] [address] [place], [place] [address]  Glucose, capillary     Status: Abnormal    Collection Time: 08/31/20  7:52 AM  Result Value Ref Range    Glucose-Capillary 67 (L) 70 - 99 mg/dL      Comment: Glucose reference range applies only to samples taken after fasting for at least 8 hours.  Glucose, capillary     Status: None    Collection Time: 08/31/20  8:41 AM  Result Value Ref Range    Glucose-Capillary 83 70 - 99  mg/dL      Comment: Glucose reference range applies only to samples taken after fasting for at least 8 hours.  Culture, blood (routine x 2)     Status: None (Preliminary result)    Collection Time: 08/31/20  9:32 AM    Specimen: BLOOD LEFT ARM  Result Value Ref Range    Specimen Description BLOOD LEFT ARM      Special Requests          BOTTLES DRAWN AEROBIC ONLY Blood Culture results may not be optimal due to an inadequate volume of blood received in culture bottles    Culture          NO GROWTH < 24 HOURS Performed at [facility] [address] [place], [place] [address]      Report Status PENDING    Culture, blood (routine x 2)     Status: None (Preliminary result)    Collection Time: 08/31/20   9:41 AM    Specimen: BLOOD LEFT HAND  Result Value Ref Range    Specimen Description BLOOD LEFT HAND      Special Requests          BOTTLES DRAWN AEROBIC AND ANAEROBIC Blood Culture results may not be optimal due to an inadequate volume of blood received in culture bottles    Culture          NO GROWTH < 24 HOURS Performed at [facility] [address] [place], [place] [address]      Report Status PENDING    Glucose, capillary     Status: Abnormal    Collection Time: 08/31/20 11:10 AM  Result Value Ref Range    Glucose-Capillary 114 (H) 70 - 99 mg/dL      Comment: Glucose reference range applies only to samples taken after fasting for at least 8 hours.  Glucose, capillary     Status: Abnormal    Collection Time: 08/31/20  4:05 PM  Result Value Ref Range    Glucose-Capillary 127 (H) 70 - 99 mg/dL      Comment: Glucose reference range applies only to samples taken after fasting for at least 8 hours.  Glucose, capillary     Status: Abnormal    Collection Time: 08/31/20  8:33 PM  Result Value Ref Range    Glucose-Capillary 140 (H) 70 - 99 mg/dL      Comment: Glucose reference range applies only to samples taken after fasting for at least 8 hours.  Basic metabolic panel     Status: Abnormal    Collection Time: 09/01/20  4:39 AM  Result Value Ref Range    Sodium 136 135 - 145 mmol/L    Potassium 4.2 3.5 - 5.1 mmol/L    Chloride 107 98 - 111 mmol/L    CO2 22 22 - 32 mmol/L    Glucose, Bld 118 (H) 70 - 99 mg/dL      Comment: Glucose reference range applies only to samples taken after fasting for at least 8 hours.    BUN 18 6 - 20 mg/dL    Creatinine, Ser 1.47 (H) 0.61 - 1.24 mg/dL    Calcium 8.5 (L) 8.9 - 10.3 mg/dL    GFR, Estimated 57 (L) >60 mL/min      Comment: (NOTE) Calculated using the CKD-EPI Creatinine Equation (2021)      Anion gap 7 5 - 15      Comment: Performed at [facility]  [address], [place], [place] [address]  CBC     Status: Abnormal     Collection Time: 09/01/20  4:39 AM  Result Value Ref Range    WBC 12.3 (H) 4.0 - 10.5 K/uL    RBC 2.90 (L) 4.22 - 5.81 MIL/uL    Hemoglobin 7.5 (L) 13.0 - 17.0 g/dL    HCT 24.2 (L) 39.0 - 52.0 %    MCV 83.4 80.0 - 100.0 fL    MCH 25.9 (L) 26.0 - 34.0 pg    MCHC 31.0 30.0 - 36.0 g/dL    RDW 17.2 (H) 11.5 - 15.5 %    Platelets 558 (H) 150 - 400 K/uL    nRBC 0.0 0.0 - 0.2 %      Comment: Performed at [facility] [address], [place], [place] [address]  Glucose, capillary     Status: Abnormal    Collection Time: 09/01/20  8:40 AM  Result Value Ref Range    Glucose-Capillary 104 (H) 70 - 99 mg/dL      Comment: Glucose reference range applies only to samples taken after fasting for at least 8 hours.    Comment 1 Notify RN         Imaging Results (Last 48 hours)  DG CHEST PORT 1 VIEW   Result Date: 08/31/2020 CLINICAL DATA:  Fever EXAM: PORTABLE CHEST 1 VIEW COMPARISON:  08/26/2020 FINDINGS: The heart size and mediastinal contours are stable. No focal airspace consolidation, pleural effusion, or pneumothorax. The visualized skeletal structures are unremarkable. IMPRESSION: No active disease. Electronically Signed   By: Davina Poke D.O.   On: 08/31/2020 12:40             Medical Problem List and Plan: 1.   Debility secondary to RIGHT BKA 10/27/3380 complicated by sepsis             -patient may shower if R BKA covered             -ELOS/Goals: 7-10 days mod I to supervision 2.  Antithrombotics: -DVT/anticoagulation: Lovenox             -antiplatelet therapy: N/A 3. Pain Management: Neurontin 300 mg 3 times daily, Zanaflex 4 mg nightly, oxycodone as needed- Gabapentin is the most help- asked for some indomethicin/Toradol- will give ONE dose of Toradol PO due to CKD- won't do more right now, for gout and R groin strain.  4. Mood: Provide emotional support             -antipsychotic agents: N/A 5. Neuropsych: This patient is capable of making decisions on his own  behalf. 6. Skin/Wound Care: Routine skin checks 7. Fluids/Electrolytes/Nutrition: Routine in and outs with follow-up chemistries 8.  Acute on chronic anemia.  Patient had received total 4 units packed red blood cells.  Continue iron supplement.  Follow-up CBC 9.  Diabetes mellitus with peripheral neuropathy.  Hemoglobin A1c 7.8.  Lantus insulin 25 units twice daily, NovoLog 4 units 3 times daily with meals.  Diabetic teaching 10.  Right elbow gout.  Uric acid 11.2.  Zyloprim 100 mg twice daily.  Course of colchicine has been completed. 11.  ID/sepsis.  Blood cultures 1/4 grew staph.  Completing course of amoxicillin after initially being maintained on cefepime 12.  CKD stage III.  Follow-up chemistries 13.  Recent history C. difficile 06/2020.  Completing course of p.o. vancomycin.  Contact precautions discontinued 14.VRE/UTI.Contact precautions and complete course of Zyvox 14.  Obesity.  BMI 38.27.  Dietary follow-up  15.  Tachycardia.  Lopressor 12.5 mg twice daily.  Monitor with increased mobility 16. R BKA- suggested laying on abd/stomach daily to reduce risk of hip contracture as well as no pillow under knee.      Lavon Paganini Angiulli, PA-C 09/01/2020    I have personally performed a face to face diagnostic evaluation of this patient and formulated the key components of the plan.  Additionally, I have personally reviewed laboratory data, imaging studies, as well as relevant notes and concur with the physician assistant's documentation above.   The patient's status has not changed from the original H&P.  Any changes in documentation from the acute care chart have been noted above.

## 2020-09-01 NOTE — Progress Notes (Signed)
     Subjective:   Seen earlier today on 6 E. Awake alert and oriented x4.   Patient reports pain as moderate.    Objective:   VITALS:  Temp:  [98.4 F (36.9 C)-99.4 F (37.4 C)] 98.4 F (36.9 C) (07/14 1523) Pulse Rate:  [93-94] 93 (07/14 1523) Resp:  [17-19] 17 (07/14 1523) BP: (143-171)/(87-99) 143/90 (07/14 1523) SpO2:  [97 %-100 %] 97 % (07/14 1523) Weight:  [144 kg] 144 kg (07/14 1523)  Neurologically intact ABD soft Neurovascular intact Sensation intact distally Incision: clear serous drainage from hemovac drain exit area right anteromedial BKA New bandage and stump shrinking sock applied. To be transferred to Rehab for therapy post right BKA.   LABS Recent Labs    08/30/20 0022 08/31/20 0308 09/01/20 0439  HGB 7.7* 7.7* 7.5*  WBC 13.9* 13.1* 12.3*  PLT 543* 550* 558*   Recent Labs    08/31/20 0308 09/01/20 0439  NA 135 136  K 4.2 4.2  CL 105 107  CO2 20* 22  BUN 20 18  CREATININE 1.42* 1.47*  GLUCOSE 99 118*   No results for input(s): LABPT, INR in the last 72 hours.   Assessment/Plan:POD #5 right below knee amputation for  Chronic osteomyelitis right foot, three ray foot with persisting Lateral foot ulcer and underlying osteomyelitis.      Advance diet Up with therapy D/C IV fluids Continue ABX therapy due to likely soft tissue cellulitis infection In zone of edema and induration above ankle and distal portions now the anterior portion of the distal BK residual limb. Transfer to Rehab for intense therapy.  Stump shrinking till stable to consider BK prosthesis.   [name] 09/01/2020, 6:17 PM Patient ID: Patrick [name], male   DOB: [birth year], 55 y.o.   MRN: [id number]

## 2020-09-01 NOTE — Progress Notes (Signed)
Patient arrived to floor from [facility] via bed by staff. Patient denies pain at this time. Patient oriented to floor and made aware of plan. Patient voices understanding of  plan and safety. Call bell within reach and bed in lowest position. Will continue to monitor

## 2020-09-01 NOTE — Progress Notes (Signed)
PT Cancellation Note  Patient Details Name: Patrick [name] MRN: [id number] DOB: 11-Nov-1965   Cancelled Treatment:    Reason Eval/Treat Not Completed: Pain limiting ability to participate.  Pt had oxycontin and is still having throbbing pain, so will retry at another time.   Ramond Dial 09/01/2020, 11:05 AM  Mee Hives, PT MS Acute Rehab Dept. Number: [place] [facility] [place]

## 2020-09-01 NOTE — Discharge Summary (Signed)
Physician Discharge Summary  Patrick [name] TDS:[id number] DOB: May 15, 1965 DOA: 08/26/2020  PCP: Beaulah Corin, [facility]  Admit date: 08/26/2020 Discharge date: 09/01/2020  Admitted From: home Disposition:  CIR  Recommendations for Outpatient Follow-up:  Follow up with PCP in 1-2 weeks Please obtain BMP/CBC in one week Please follow up on patient's blood pressure.  His BP meds were held due to soft BP's.  Have resumed amlodipine, but continued holding losartan at d/c. Monitor blood sugars and insulin requirements.   Follow up with orthopedic surgery as scheduled.  Please contact Dr. Otho Ket office to confirm date/time for follow up.  Home Health: No  Equipment/Devices: None   Discharge Condition: Stable  CODE STATUS: Full  Diet recommendation: Carb Modified     Discharge Diagnoses: Principal Problem:   Osteomyelitis of right foot ([place]) Active Problems:   Insulin-requiring or dependent type II diabetes mellitus ([place])   HYPERTENSION, BENIGN ESSENTIAL   Sepsis due to cellulitis (HCC)   Diabetic foot ulcer (HCC)   Diabetic foot infection (HCC)   CKD (chronic kidney disease) stage 3, GFR 30-59 ml/min (HCC)   Anemia    Summary of HPI and Hospital Course:   55 yo male with past medical history of hypertension, insulin-dependent type 2 diabetes, recent history of C. difficile, presented with right diabetic foot infection, found to have sepsis, acute anemia requiring transfusion.   Taken for right BKA by Dr. Louanne Skye on 08/27/20. Postoperatively, patient stable and progressing with therapy.    Medically stable for discharge to CIR for ongoing rehab prior to d/c home.    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  Repeat blood culture - pending, negative to date.  CXR negative.   Urine culture +VRE Prior urine culture from admission with VRE, this had not been treated previously as patient was not having any urinary symptoms.   Given new fever despite what should be adequate cellulitis coverage,  -continue Zyvox  -follow repeat Blood cx   Recent history of C. Difficile in 06/2020 Per chart review, treated with PO vanc. Not had BM in days, no longer active infection. D/C precautions and repeat test ordered on admission --Continue prophylaxis PO Vanc 151m BID while on other antibiotics  Acute on chronic normocytic anemia -hgb in 2021 was around 11-12 -hgb this year around 8-9 -hgb on presentation was 6 -s/p right bka has  hemovac on 7/11 - Hbg 6.5 - transfused 1 unit pRBC's -s/p transfusion of total 4 units pRBC's Hbg stable and improved. --Repeat CBC in 1 week.   FOBT + Per RN stool is brown with streaks of blood - consistent with likely hemorrhoids. No ab pain, no n/v. No further reports of this. Outpatient GI follow up if persistent. Monitor Hbg   Right Elbow pain Hx of Gout - per Ortho, started on Colcrys.   --consider addition of urate lowering agent in outpatient setting     Insulin-dependent type 2 diabetes, uncontrolled  with hyperglycemia -A1c at 7.8% uncontrolled -On Lantus, Novolog meatime + SSI -adjust insulin for inpatient goal 140-180 Monitor CBG's closely and adjust insulin as needed.  Patient currently requiring less insulin that at baseline, and had mild hypoglycemia this admission.   AKI on CKD stage IIIb -AKI improved. Cr near baseline  and overall stable. Renally dose meds.   --BMP in one week     Obesity: Body mass index is 38.27 kg/m.  Complicates overall care and prognosis.  Recommend lifestyle modifications including physical activity and diet for weight loss and overall long-term health.     Discharge Instructions   Discharge Instructions     Call MD for:  extreme fatigue   Complete by: As directed    Call MD for:  persistant dizziness or light-headedness   Complete by: As directed    Call MD for:  persistant nausea and vomiting   Complete by: As directed    Call MD for:  severe uncontrolled pain   Complete by: As directed    Call MD for:  temperature >100.4   Complete by: As directed    Diet - low sodium heart healthy   Complete by: As directed    Discharge instructions   Complete by: As directed    Please monitor your blood sugars and insulin needs closely.   I reduced the amount of insulin we've been giving you, because you had a couple of mildly low sugars. Insulin might need to be adjusted again in close follow up.   Increase activity slowly   Complete by: As directed    Leave dressing on - Keep it clean, dry, and intact until clinic visit   Complete by: As directed       Allergies as of 09/01/2020   No Known Allergies      Medication List     STOP taking these medications    indomethacin 25 MG capsule Commonly known as: INDOCIN   insulin lispro 200 UNIT/ML KwikPen Commonly known as: HUMALOG   Lantus SoloStar 100 UNIT/ML Solostar Pen Generic drug: insulin glargine Replaced by: insulin glargine 100 UNIT/ML injection   losartan 100 MG tablet Commonly known as: Cozaar   oxyCODONE-acetaminophen 5-325 MG tablet Commonly known as: Percocet   sodium hypochlorite external solution Commonly known as: DAKIN'S 1/2 STRENGTH       TAKE these medications    acetaminophen 325 MG tablet Commonly known as: TYLENOL Take 650 mg by mouth every 6 (six) hours as needed for headache,  fever or moderate pain.   allopurinol 100 MG tablet Commonly known as: ZYLOPRIM Take 1 tablet (100 mg total) by mouth 2 (two) times daily.   amLODipine 10 MG tablet Commonly known as: NORVASC Take 1 tablet (10 mg total) by mouth daily.   amoxicillin 500 MG capsule Commonly known as: AMOXIL Take 1 capsule (500 mg total) by mouth every 8 (eight) hours for 8 doses.   ascorbic acid 1000 MG tablet Commonly known as: VITAMIN C Take 1 tablet (1,000 mg total) by mouth daily. Start taking on: September 02, 2020   bisacodyl 5 MG EC tablet Commonly known as: DULCOLAX Take 1 tablet (5 mg total) by mouth daily as needed for moderate constipation.   blood glucose meter kit and supplies Dispense based on patient and insurance preference. Use up to four times daily as directed. (FOR ICD-10 E10.9, E11.9).   diphenhydrAMINE 25 mg capsule Commonly known as: BENADRYL Take 1 capsule (25 mg total) by mouth at bedtime as  needed for sleep.   docusate sodium 100 MG capsule Commonly known as: COLACE Take 1 capsule (100 mg total) by mouth daily. Start taking on: September 02, 2020   Ensure Max Protein Liqd Take 330 mLs (11 oz total) by mouth 2 (two) times daily.   ferrous sulfate 325 (65 FE) MG tablet Take 1 tablet (325 mg total) by mouth 3 (three) times daily after meals.   gabapentin 300 MG capsule Commonly known as: NEURONTIN Take 300 mg by mouth 3 (three) times daily.   insulin aspart 100 UNIT/ML injection Commonly known as: novoLOG Inject 4 Units into the skin 3 (three) times daily with meals.   insulin aspart 100 UNIT/ML injection Commonly known as: novoLOG Inject 0-15 Units into the skin 3 (three) times daily with meals.   insulin glargine 100 UNIT/ML injection Commonly known as: LANTUS Inject 0.25 mLs (25 Units total) into the skin 2 (two) times daily. Replaces: Lantus SoloStar 100 UNIT/ML Solostar Pen   Insulin Pen Needle 32G X 8 MM Misc Use as directed   linezolid 600 MG  tablet Commonly known as: ZYVOX Take 1 tablet (600 mg total) by mouth every 12 (twelve) hours for 4 doses.   metFORMIN 500 MG tablet Commonly known as: GLUCOPHAGE Take 1 tablet (500 mg total) by mouth 2 (two) times daily with a meal.   metoprolol tartrate 25 MG tablet Commonly known as: LOPRESSOR Take 0.5 tablets (12.5 mg total) by mouth 2 (two) times daily. What changed: how much to take   multivitamin with minerals Tabs tablet Take 1 tablet by mouth daily. Start taking on: September 02, 2020   Oxycodone HCl 10 MG Tabs Take 1-1.5 tablets (10-15 mg total) by mouth every 4 (four) hours as needed for severe pain or moderate pain (pain score 7-10).   pantoprazole 40 MG tablet Commonly known as: PROTONIX Take 1 tablet (40 mg total) by mouth daily.   polyethylene glycol 17 g packet Commonly known as: MIRALAX / GLYCOLAX Take 17 g by mouth daily as needed.   tiZANidine 4 MG tablet Commonly known as: ZANAFLEX Take 4 mg by mouth at bedtime.   vancomycin 125 MG capsule Commonly known as: VANCOCIN Take 1 capsule (125 mg total) by mouth 2 (two) times daily. What changed: when to take this   zinc sulfate 220 (50 Zn) MG capsule Take 1 capsule (220 mg total) by mouth daily. Start taking on: September 02, 2020               Discharge Care Instructions  (From admission, onward)           Start     Ordered   09/01/20 0000  Leave dressing on - Keep it clean, dry, and intact until clinic visit        09/01/20 1203            Follow-up Information     Jessy Oto, MD Follow up in 2 week(s).   Specialty: Orthopedic Surgery Why: For suture removal, For wound re-check Contact information: [place] [address] [phone]                No Known Allergies   If you experience worsening of your admission symptoms, develop shortness of breath, life threatening emergency, suicidal or homicidal thoughts you must seek medical attention immediately by  calling 911 or calling your MD immediately  if symptoms less severe.    Please note   You were cared for by a  hospitalist during your hospital stay. If you have any questions about your discharge medications or the care you received while you were in the hospital after you are discharged, you can call the unit and asked to speak with the hospitalist on call if the hospitalist that took care of you is not available. Once you are discharged, your primary care physician will handle any further medical issues. Please note that NO REFILLS for any discharge medications will be authorized once you are discharged, as it is imperative that you return to your primary care physician (or establish a relationship with a primary care physician if you do not have one) for your aftercare needs so that they can reassess your need for medications and monitor your lab values.   Consultations: Orthopedic surgery    Procedures/Studies: DG CHEST PORT 1 VIEW  Result Date: 08/31/2020 CLINICAL DATA:  Fever EXAM: PORTABLE CHEST 1 VIEW COMPARISON:  08/26/2020 FINDINGS: The heart size and mediastinal contours are stable. No focal airspace consolidation, pleural effusion, or pneumothorax. The visualized skeletal structures are unremarkable. IMPRESSION: No active disease. Electronically Signed   By: Davina Poke D.O.   On: 08/31/2020 12:40   DG Chest Port 1 View  Result Date: 08/26/2020 CLINICAL DATA:  Possible sepsis EXAM: PORTABLE CHEST 1 VIEW COMPARISON:  06/04/2020 FINDINGS: Mild cardiomegaly. No focal opacity or pleural effusion. No pneumothorax IMPRESSION: No active disease.  Mild cardiomegaly Electronically Signed   By: Donavan Foil M.D.   On: 08/26/2020 22:52   DG Foot Complete Right  Result Date: 08/26/2020 CLINICAL DATA:  Concern for foot infection EXAM: RIGHT FOOT COMPLETE - 3+ VIEW COMPARISON:  07/28/2020, 06/04/2020 FINDINGS: Patient is status post right fourth and fifth transmetatarsal amputation. Large  irregular wound along the lateral aspect of the foot with gas in the soft tissues. Interval fragmentation and osseous destructive change of the fourth and fifth metatarsal remnant bases. Erosive change at the lateral cuboid. Increased periosteal reaction at the shaft of third metatarsal. Advanced degenerative changes of the first MTP joint. IMPRESSION: 1. 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 Gas in the soft tissues is presumably related to the large wound. 2. Increased periosteal reaction along the shaft of third metatarsal question reactive versus due to infection though no gross osseous destructive change Electronically Signed   By: Donavan Foil M.D.   On: 08/26/2020 22:55    Right BKA   Subjective: Pt denies any fever/chills, dysuria, N/V.  Having some groin pain that he states was present prior to admission from something he'd done, feels strained.  States he's been offered a job that should be starting on 8/1, hoping to progress quickly with rehab.   Discharge Exam: Vitals:   09/01/20 0430 09/01/20 0843  BP: (!) 154/87   Pulse:    Resp: 17   Temp: 99.4 F (37.4 C) 98.8 F (37.1 C)  SpO2: 100%    Vitals:   08/31/20 2029 08/31/20 2124 09/01/20 0430 09/01/20 0843  BP: (!) 171/99  (!) 154/87   Pulse:      Resp: '19 17 17   ' Temp: 98.6 F (37 C)  99.4 F (37.4 C) 98.8 F (37.1 C)  TempSrc: Oral  Oral Oral  SpO2:   100%   Weight:      Height:  General: Pt is alert, awake, not in acute distress, obese Cardiovascular: RRR, S1/S2 +, no rubs, no gallops Respiratory: on room air, normal respiratory effort Abdominal: Soft, NT, ND, bowel sounds + Extremities: R BKA stump shrinker in place.  LLE edema and stasis skin changes appear stable    The  results of significant diagnostics from this hospitalization (including imaging, microbiology, ancillary and laboratory) are listed below for reference.     Microbiology: Recent Results (from the past 240 hour(s))  Resp Panel by RT-PCR (Flu A&B, Covid) Nasopharyngeal Swab     Status: None   Collection Time: 08/26/20  9:46 PM   Specimen: Nasopharyngeal Swab; Nasopharyngeal(NP) swabs in vial transport medium  Result Value Ref Range Status   SARS Coronavirus 2 by RT PCR NEGATIVE NEGATIVE Final    Comment: (NOTE) SARS-CoV-2 target nucleic acids are NOT DETECTED.  The SARS-CoV-2 RNA is generally detectable in upper respiratory specimens during the acute phase of infection. The lowest concentration of SARS-CoV-2 viral copies this assay can detect is 138 copies/mL. A negative result does not preclude SARS-Cov-2 infection and should not be used as the sole basis for treatment or other patient management decisions. A negative result may occur with  improper specimen collection/handling, submission of specimen other than nasopharyngeal swab, presence of viral mutation(s) within the areas targeted by this assay, and inadequate number of viral copies(<138 copies/mL). A negative result must be combined with clinical observations, patient history, and epidemiological information. The expected result is Negative.  Fact Sheet for Patients:  EntrepreneurPulse.com.au  Fact Sheet for Healthcare Providers:  IncredibleEmployment.be  This test is no t yet approved or cleared by the [place] FDA and  has been authorized for detection and/or diagnosis of SARS-CoV-2 by FDA under an Emergency Use Authorization (EUA). This EUA will remain  in effect (meaning this test can be used) for the duration of the COVID-19 declaration under Section 564(b)(1) of the Act, 21 U.S.C.section 360bbb-3(b)(1), unless the authorization is terminated  or revoked sooner.        Influenza A by PCR NEGATIVE NEGATIVE Final   Influenza B by PCR NEGATIVE NEGATIVE Final    Comment: (NOTE) The Xpert Xpress SARS-CoV-2/FLU/RSV plus assay is intended as an aid in the diagnosis of influenza from Nasopharyngeal swab specimens and should not be used as a sole basis for treatment. Nasal washings and aspirates are unacceptable for Xpert Xpress SARS-CoV-2/FLU/RSV testing.  Fact Sheet for Patients: EntrepreneurPulse.com.au  Fact Sheet for Healthcare Providers: IncredibleEmployment.be  This test is not yet approved or cleared by the [place] FDA and has been authorized for detection and/or diagnosis of SARS-CoV-2 by FDA under an Emergency Use Authorization (EUA). This EUA will remain in effect (meaning this test can be used) for the duration of the COVID-19 declaration under Section 564(b)(1) of the Act, 21 U.S.C. section 360bbb-3(b)(1), unless the authorization is terminated or revoked.  Performed at [facility], [place] [address] [place], [place] [address]   Urine culture     Status: Abnormal   Collection Time: 08/26/20  9:46 PM   Specimen: In/Out Cath Urine  Result Value Ref Range Status   Specimen Description IN/OUT CATH URINE  Final   Special Requests   Final    NONE Performed at [facility], [place] [address] [place], [place] [address]    Culture (A)  Final    1,000 COLONIES/mL VANCOMYCIN RESISTANT ENTEROCOCCUS   Report Status 08/30/2020 FINAL  Final   Organism ID, Bacteria VANCOMYCIN  RESISTANT ENTEROCOCCUS (A)  Final      Susceptibility   Vancomycin resistant enterococcus - MIC*    AMPICILLIN >=32 RESISTANT Resistant     NITROFURANTOIN 32 SENSITIVE Sensitive     VANCOMYCIN >=32 RESISTANT Resistant     LINEZOLID 2 SENSITIVE Sensitive     * 1,000 COLONIES/mL VANCOMYCIN RESISTANT ENTEROCOCCUS  Blood Culture (routine x 2)     Status: Abnormal   Collection Time: 08/26/20 10:05 PM   Specimen: BLOOD RIGHT ARM   Result Value Ref Range Status   Specimen Description BLOOD RIGHT ARM  Final   Special Requests   Final    BOTTLES DRAWN AEROBIC AND ANAEROBIC Blood Culture adequate volume   Culture  Setup Time   Final    GRAM POSITIVE COCCI IN CLUSTERS AEROBIC BOTTLE ONLY CRITICAL RESULT CALLED TO, READ BACK BY AND VERIFIED WITH: PHARMD LAURA SEAY BY MESSAN H. AT [id number] ON 7  10 2022    Culture (A)  Final    STAPHYLOCOCCUS HOMINIS THE SIGNIFICANCE OF ISOLATING THIS ORGANISM FROM A SINGLE SET OF BLOOD CULTURES WHEN MULTIPLE SETS ARE DRAWN IS UNCERTAIN. PLEASE NOTIFY THE MICROBIOLOGY DEPARTMENT WITHIN ONE WEEK IF SPECIATION AND SENSITIVITIES ARE REQUIRED. Performed at [facility], [place] [address] [place], [place] [address]    Report Status 08/29/2020 FINAL  Final  Blood Culture ID Panel (Reflexed)     Status: Abnormal   Collection Time: 08/26/20 10:05 PM  Result Value Ref Range Status   Enterococcus faecalis NOT DETECTED NOT DETECTED Final   Enterococcus Faecium NOT DETECTED NOT DETECTED Final   Listeria monocytogenes NOT DETECTED NOT DETECTED Final   Staphylococcus species DETECTED (A) NOT DETECTED Final    Comment: CRITICAL RESULT CALLED TO, READ BACK BY AND VERIFIED WITH: PHARMD LAURA SEAY BY MESSAN H. AT [id number] ON 7  10 2022    Staphylococcus aureus (BCID) NOT DETECTED NOT DETECTED Final   Staphylococcus epidermidis NOT DETECTED NOT DETECTED Final   Staphylococcus lugdunensis NOT DETECTED NOT DETECTED Final   Streptococcus species NOT DETECTED NOT DETECTED Final   Streptococcus agalactiae NOT DETECTED NOT DETECTED Final   Streptococcus pneumoniae NOT DETECTED NOT DETECTED Final   Streptococcus pyogenes NOT DETECTED NOT DETECTED Final   A.calcoaceticus-baumannii NOT DETECTED NOT DETECTED Final   Bacteroides fragilis NOT DETECTED NOT DETECTED Final   Enterobacterales NOT DETECTED NOT DETECTED Final   Enterobacter cloacae complex NOT DETECTED NOT DETECTED Final   Escherichia coli NOT DETECTED  NOT DETECTED Final   Klebsiella aerogenes NOT DETECTED NOT DETECTED Final   Klebsiella oxytoca NOT DETECTED NOT DETECTED Final   Klebsiella pneumoniae NOT DETECTED NOT DETECTED Final   Proteus species NOT DETECTED NOT DETECTED Final   Salmonella species NOT DETECTED NOT DETECTED Final   Serratia marcescens NOT DETECTED NOT DETECTED Final   Haemophilus influenzae NOT DETECTED NOT DETECTED Final   Neisseria meningitidis NOT DETECTED NOT DETECTED Final   Pseudomonas aeruginosa NOT DETECTED NOT DETECTED Final   Stenotrophomonas maltophilia NOT DETECTED NOT DETECTED Final   Candida albicans NOT DETECTED NOT DETECTED Final   Candida auris NOT DETECTED NOT DETECTED Final   Candida glabrata NOT DETECTED NOT DETECTED Final   Candida krusei NOT DETECTED NOT DETECTED Final   Candida parapsilosis NOT DETECTED NOT DETECTED Final   Candida tropicalis NOT DETECTED NOT DETECTED Final   Cryptococcus neoformans/gattii NOT DETECTED NOT DETECTED Final    Comment: Performed at [facility], [place]. [address], [place], [place] [address]  Blood  Culture (routine x 2)     Status: None   Collection Time: 08/26/20 10:06 PM   Specimen: BLOOD LEFT ARM  Result Value Ref Range Status   Specimen Description BLOOD LEFT ARM  Final   Special Requests   Final    BOTTLES DRAWN AEROBIC ONLY Blood Culture adequate volume   Culture   Final    NO GROWTH 5 DAYS Performed at [facility], [address] [place], [place] [address]    Report Status 08/31/2020 FINAL  Final  Culture, blood (routine x 2)     Status: None (Preliminary result)   Collection Time: 08/31/20  9:32 AM   Specimen: BLOOD LEFT ARM  Result Value Ref Range Status   Specimen Description BLOOD LEFT ARM  Final   Special Requests   Final    BOTTLES DRAWN AEROBIC ONLY Blood Culture results may not be optimal due to an inadequate volume of blood received in culture bottles   Culture   Final    NO GROWTH < 24 HOURS Performed at [place], [place] [address], [place], [place] [address]    Report Status PENDING  Incomplete  Culture, blood (routine x 2)     Status: None (Preliminary result)   Collection Time: 08/31/20  9:41 AM   Specimen: BLOOD LEFT HAND  Result Value Ref Range Status   Specimen Description BLOOD LEFT HAND  Final   Special Requests   Final    BOTTLES DRAWN AEROBIC AND ANAEROBIC Blood Culture results may not be optimal due to an inadequate volume of blood received in culture bottles   Culture   Final    NO GROWTH < 24 HOURS Performed at [facility], [place] [address] [place], [place] [address]    Report Status PENDING  Incomplete     Labs: BNP (last 3 results) No results for input(s): BNP in the last 8760 hours. Basic Metabolic Panel: Recent Labs  Lab 08/28/20 0051 08/29/20 0157 08/30/20 0022 08/31/20 0308 09/01/20 0439  NA 135 135 137 135 136  K 4.8 4.4 4.3 4.2 4.2  CL 108 108 110 105 107  CO2 16* 19* 21* 20* 22  GLUCOSE 253* 260* 138* 99 118*  BUN 26* 28* '20 20 18  ' CREATININE 1.70* 1.88* 1.48* 1.42* 1.47*  CALCIUM 8.6* 8.4* 8.5* 8.6* 8.5*   Liver Function Tests: Recent Labs  Lab 08/26/20 2205 08/29/20 0157  AST 59* 16  ALT 81* 36  ALKPHOS 184* 120  BILITOT 0.7 0.6  PROT 7.1 5.9*  ALBUMIN 2.4* 2.1*   No results for input(s): LIPASE, AMYLASE in the last 168 hours. No results for input(s): AMMONIA in the last 168 hours. CBC: Recent Labs  Lab 08/26/20 2325 08/27/20 1024 08/28/20 0051 08/29/20 0157 08/30/20 0022 08/31/20 0308 09/01/20 0439  WBC 14.0*   < > 16.5* 17.2* 13.9* 13.1* 12.3*  NEUTROABS 10.3*  --   --   --   --   --   --   HGB 6.0*   < > 7.2* 6.5* 7.7* 7.7* 7.5*  HCT 21.0*   < > 24.0* 21.0* 24.2* 24.7* 24.2*  MCV 84.0   < > 83.0 82.7 82.0 83.4 83.4  PLT 572*   < > 524* 520* 543* 550* 558*   < > = values in this interval not displayed.   Cardiac Enzymes: No results for input(s): CKTOTAL, CKMB, CKMBINDEX, TROPONINI in the last 168 hours. BNP: Invalid input(s):  POCBNP CBG: Recent  Labs  Lab 08/31/20 1110 08/31/20 1605 08/31/20 2033 09/01/20 0840 09/01/20 1200  GLUCAP 114* 127* 140* 104* 78   D-Dimer No results for input(s): DDIMER in the last 72 hours. Hgb A1c No results for input(s): HGBA1C in the last 72 hours. Lipid Profile No results for input(s): CHOL, HDL, LDLCALC, TRIG, CHOLHDL, LDLDIRECT in the last 72 hours. Thyroid function studies No results for input(s): TSH, T4TOTAL, T3FREE, THYROIDAB in the last 72 hours.  Invalid input(s): FREET3 Anemia work up No results for input(s): VITAMINB12, FOLATE, FERRITIN, TIBC, IRON, RETICCTPCT in the last 72 hours. Urinalysis    Component Value Date/Time   COLORURINE YELLOW 08/26/2020 2146   APPEARANCEUR HAZY (A) 08/26/2020 2146   LABSPEC 1.017 08/26/2020 2146   PHURINE 5.0 08/26/2020 2146   GLUCOSEU NEGATIVE 08/26/2020 2146   HGBUR SMALL (A) 08/26/2020 2146   HGBUR negative 01/27/2010 0934   BILIRUBINUR NEGATIVE 08/26/2020 2146   [place] NEGATIVE 08/26/2020 2146   PROTEINUR 30 (A) 08/26/2020 2146   UROBILINOGEN 0.2 10/26/2013 1130   NITRITE NEGATIVE 08/26/2020 2146   LEUKOCYTESUR NEGATIVE 08/26/2020 2146   [facility] Invalid input(s): PROCALCITONIN,  WBC,  LACTICIDVEN Microbiology Recent Results (from the past 240 hour(s))  Resp Panel by RT-PCR (Flu A&B, Covid) Nasopharyngeal Swab     Status: None   Collection Time: 08/26/20  9:46 PM   Specimen: Nasopharyngeal Swab; Nasopharyngeal(NP) swabs in vial transport medium  Result Value Ref Range Status   SARS Coronavirus 2 by RT PCR NEGATIVE NEGATIVE Final    Comment: (NOTE) SARS-CoV-2 target nucleic acids are NOT DETECTED.  The SARS-CoV-2 RNA is generally detectable in upper respiratory specimens during the acute phase of infection. The lowest concentration of SARS-CoV-2 viral copies this assay can detect is 138 copies/mL. A negative result does not preclude SARS-Cov-2 infection and should not be used as the sole basis for  treatment or other patient management decisions. A negative result may occur with  improper specimen collection/handling, submission of specimen other than nasopharyngeal swab, presence of viral mutation(s) within the areas targeted by this assay, and inadequate number of viral copies(<138 copies/mL). A negative result must be combined with clinical observations, patient history, and epidemiological information. The expected result is Negative.  Fact Sheet for Patients:  EntrepreneurPulse.com.au  Fact Sheet for Healthcare Providers:  IncredibleEmployment.be  This test is no t yet approved or cleared by the [place] FDA and  has been authorized for detection and/or diagnosis of SARS-CoV-2 by FDA under an Emergency Use Authorization (EUA). This EUA will remain  in effect (meaning this test can be used) for the duration of the COVID-19 declaration under Section 564(b)(1) of the Act, 21 U.S.C.section 360bbb-3(b)(1), unless the authorization is terminated  or revoked sooner.       Influenza A by PCR NEGATIVE NEGATIVE Final   Influenza B by PCR NEGATIVE NEGATIVE Final    Comment: (NOTE) The Xpert Xpress SARS-CoV-2/FLU/RSV plus assay is intended as an aid in the diagnosis of influenza from Nasopharyngeal swab specimens and should not be used as a sole basis for treatment. Nasal washings and aspirates are unacceptable for Xpert Xpress SARS-CoV-2/FLU/RSV testing.  Fact Sheet for Patients: EntrepreneurPulse.com.au  Fact Sheet for Healthcare Providers: IncredibleEmployment.be  This test is not yet approved or cleared by the [place] FDA and has been authorized for detection and/or diagnosis of SARS-CoV-2 by FDA under an Emergency Use Authorization (EUA). This EUA will remain in effect (meaning this test can be used) for the duration of the COVID-19  declaration under Section 564(b)(1) of the Act, 21  U.S.C. section 360bbb-3(b)(1), unless the authorization is terminated or revoked.  Performed at [facility], [place] [address], [place], [place] [address]   Urine culture     Status: Abnormal   Collection Time: 08/26/20  9:46 PM   Specimen: In/Out Cath Urine  Result Value Ref Range Status   Specimen Description IN/OUT CATH URINE  Final   Special Requests   Final    NONE Performed at [facility], [place] [address] [place], [place] [address]    Culture (A)  Final    1,000 COLONIES/mL VANCOMYCIN RESISTANT ENTEROCOCCUS   Report Status 08/30/2020 FINAL  Final   Organism ID, Bacteria VANCOMYCIN RESISTANT ENTEROCOCCUS (A)  Final      Susceptibility   Vancomycin resistant enterococcus - MIC*    AMPICILLIN >=32 RESISTANT Resistant     NITROFURANTOIN 32 SENSITIVE Sensitive     VANCOMYCIN >=32 RESISTANT Resistant     LINEZOLID 2 SENSITIVE Sensitive     * 1,000 COLONIES/mL VANCOMYCIN RESISTANT ENTEROCOCCUS  Blood Culture (routine x 2)     Status: Abnormal   Collection Time: 08/26/20 10:05 PM   Specimen: BLOOD RIGHT ARM  Result Value Ref Range Status   Specimen Description BLOOD RIGHT ARM  Final   Special Requests   Final    BOTTLES DRAWN AEROBIC AND ANAEROBIC Blood Culture adequate volume   Culture  Setup Time   Final    GRAM POSITIVE COCCI IN CLUSTERS AEROBIC BOTTLE ONLY CRITICAL RESULT CALLED TO, READ BACK BY AND VERIFIED WITH: PHARMD LAURA SEAY BY [employer]. AT [id number] ON 7  10 2022    Culture (A)  Final    STAPHYLOCOCCUS HOMINIS THE SIGNIFICANCE OF ISOLATING THIS ORGANISM FROM A SINGLE SET OF BLOOD CULTURES WHEN MULTIPLE SETS ARE DRAWN IS UNCERTAIN. PLEASE NOTIFY THE MICROBIOLOGY DEPARTMENT WITHIN ONE WEEK IF SPECIATION AND SENSITIVITIES ARE REQUIRED. Performed at [facility], [place] [address], [place], [place] [address]    Report Status 08/29/2020 FINAL  Final  Blood Culture ID Panel (Reflexed)     Status: Abnormal   Collection Time: 08/26/20 10:05 PM  Result Value  Ref Range Status   Enterococcus faecalis NOT DETECTED NOT DETECTED Final   Enterococcus Faecium NOT DETECTED NOT DETECTED Final   Listeria monocytogenes NOT DETECTED NOT DETECTED Final   Staphylococcus species DETECTED (A) NOT DETECTED Final    Comment: CRITICAL RESULT CALLED TO, READ BACK BY AND VERIFIED WITH: PHARMD LAURA SEAY BY MESSAN H. AT [id number] ON 7  10 2022    Staphylococcus aureus (BCID) NOT DETECTED NOT DETECTED Final   Staphylococcus epidermidis NOT DETECTED NOT DETECTED Final   Staphylococcus lugdunensis NOT DETECTED NOT DETECTED Final   Streptococcus species NOT DETECTED NOT DETECTED Final   Streptococcus agalactiae NOT DETECTED NOT DETECTED Final   Streptococcus pneumoniae NOT DETECTED NOT DETECTED Final   Streptococcus pyogenes NOT DETECTED NOT DETECTED Final   A.calcoaceticus-baumannii NOT DETECTED NOT DETECTED Final   Bacteroides fragilis NOT DETECTED NOT DETECTED Final   Enterobacterales NOT DETECTED NOT DETECTED Final   Enterobacter cloacae complex NOT DETECTED NOT DETECTED Final   Escherichia coli NOT DETECTED NOT DETECTED Final   Klebsiella aerogenes NOT DETECTED NOT DETECTED Final   Klebsiella oxytoca NOT DETECTED NOT DETECTED Final   Klebsiella pneumoniae NOT DETECTED NOT DETECTED Final   Proteus species NOT DETECTED NOT DETECTED Final   Salmonella species NOT DETECTED NOT DETECTED Final   Serratia  marcescens NOT DETECTED NOT DETECTED Final   Haemophilus influenzae NOT DETECTED NOT DETECTED Final   Neisseria meningitidis NOT DETECTED NOT DETECTED Final   Pseudomonas aeruginosa NOT DETECTED NOT DETECTED Final   Stenotrophomonas maltophilia NOT DETECTED NOT DETECTED Final   Candida albicans NOT DETECTED NOT DETECTED Final   Candida auris NOT DETECTED NOT DETECTED Final   Candida glabrata NOT DETECTED NOT DETECTED Final   Candida krusei NOT DETECTED NOT DETECTED Final   Candida parapsilosis NOT DETECTED NOT DETECTED Final   Candida tropicalis NOT DETECTED NOT  DETECTED Final   Cryptococcus neoformans/gattii NOT DETECTED NOT DETECTED Final    Comment: Performed at [facility], [place] [address], [place], [place] [address]  Blood Culture (routine x 2)     Status: None   Collection Time: 08/26/20 10:06 PM   Specimen: BLOOD LEFT ARM  Result Value Ref Range Status   Specimen Description BLOOD LEFT ARM  Final   Special Requests   Final    BOTTLES DRAWN AEROBIC ONLY Blood Culture adequate volume   Culture   Final    NO GROWTH 5 DAYS Performed at [facility], [place] [address], [place], [place] [address]    Report Status 08/31/2020 FINAL  Final  Culture, blood (routine x 2)     Status: None (Preliminary result)   Collection Time: 08/31/20  9:32 AM   Specimen: BLOOD LEFT ARM  Result Value Ref Range Status   Specimen Description BLOOD LEFT ARM  Final   Special Requests   Final    BOTTLES DRAWN AEROBIC ONLY Blood Culture results may not be optimal due to an inadequate volume of blood received in culture bottles   Culture   Final    NO GROWTH < 24 HOURS Performed at [facility], [place] [address] [place], [place] [address]    Report Status PENDING  Incomplete  Culture, blood (routine x 2)     Status: None (Preliminary result)   Collection Time: 08/31/20  9:41 AM   Specimen: BLOOD LEFT HAND  Result Value Ref Range Status   Specimen Description BLOOD LEFT HAND  Final   Special Requests   Final    BOTTLES DRAWN AEROBIC AND ANAEROBIC Blood Culture results may not be optimal due to an inadequate volume of blood received in culture bottles   Culture   Final    NO GROWTH < 24 HOURS Performed at [facility], [place] [address], [place], [place] [address]    Report Status PENDING  Incomplete     Time coordinating discharge: Over 30 minutes  SIGNED:   Ezekiel Slocumb, DO [facility] 09/01/2020, 12:03 PM   If 7PM-7AM, please contact night-coverage [email or web address]

## 2020-09-01 NOTE — Progress Notes (Signed)
Courtney Heys, MD   Physician  Physical Medicine and Rehabilitation  PMR Pre-admission     Signed  Date of Service:  09/01/2020 12:21 PM       Related encounter: ED to Hosp-Admission (Discharged) from 08/26/2020 in [place]          Show:Clear all [x]Written[x]Templated[x]Copied  Added by: [x]Findlay Dagher, Vertis Kelch, RN[x]Lovorn, Jinny Blossom, MD   []Hover for details                                                                                                                                                                                                                                                                                                                                PMR Admission Coordinator Pre-Admission Assessment   Patient: Patrick [name] is an 55 y.o., male MRN: [id number] DOB: 04-30-1965 Height: 6' 4" (193 cm) Weight: (!) 142.6 kg Insurance Information HMO:     PPO:      PCP:      IPA:      80/20:      OTHER:   Patient states his insurance is through the end of the month of July only. No longer working for this company. To start a new job in August   PRIMARY: [place]      Policy#: [id number]      Subscriber: pt CM Name: Aaron Edelman      Phone#: [phone]     Fax#: [phone] Pre-Cert#: [id number] approved for 7 days with f/u with [facility] phone [phone] fax [phone]      Employer: Benefits:  Phone #: [phone]     Name: 7/14 Eff. Date: 04/19/2020     Deduct: $3000      Out of Pocket Max: [phone]      Life Max: none CIR: 70%      SNF: 70% 60 days per year Outpatient: $30 per  visit     Co-Pay: 20 visits per year combined Home Health: 70%      Co-Pay: 60 visits per year DME: 70%     Co-Pay: 30% Providers: in network  SECONDARY: none         Financial Counselor:       Phone#:   The Conservator, museum/gallery" for patients in Inpatient Rehabilitation Facilities with attached "Privacy Act [place] Records" was provided and verbally reviewed with: N/A   Emergency Contact Information Contact Information       Name Relation Home Work Mobile    Olivet Friend     [phone]           Current Medical History  Patient Admitting Diagnosis: BKA   History of Present Illness:  55 year old right-handed male with history of diabetes mellitus, gout, chronic anemia with hemoglobin 8-9, obesity with BMI of 38.27, CKD stage III, hypertension, PVD with right fourth and fifth ray amputations 05/2020, recent COVID 04/2019 as well as a recent C. difficile 06/2020 treated with p.o. vancomycin.    Presented 08/26/2020 with low-grade fever/tachycardia as well as nonhealing right foot ulcer followed at the [facility] wound care clinic and has undergone multiple debridements.  Admission chemistry sodium 134, BUN 32, creatinine 2.27, WBC 14,000, hemoglobin 6.0, lactic acid 1.3.  Blood cultures obtained 1/4 grew staph, urine culture 1000 colony of Enterococcus.  Prior urine culture from admission with VRE and started on Zyvox.  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 Louanne Skye.  His Hemovac was discontinued 08/29/2020.  Initially maintained on broad-spectrum antibiotics simplified to amoxicillin completing course as directed.  Hospital course acute blood loss anemia 6.5 and has received a total of 4 units packed red blood cells with latest hemoglobin 7.5 as well as patient did have one positive Hemoccult stool and monitored.   Patient developed right elbow gout pain with uric acid 11.2 and placed on colchicine as well as Zyloprim.  He was cleared to begin Lovenox for DVT prophylaxis 08/28/2020.  In regards to patient's recent C. difficile cleared by infectious disease that he no longer needs contact precautions and would complete  course of p.o. vancomycin.    Patient's medical record from [facility] has been reviewed by the rehabilitation admission coordinator and physician.   Past Medical History      Past Medical History:  Diagnosis Date   Diabetes mellitus     Gout     History of COVID-19 04/2019   Hypertension        Family History   family history is not on file.   Prior Rehab/Hospitalizations Has the patient had prior rehab or hospitalizations prior to admission? Yes   Has the patient had major surgery during 100 days prior to admission? Yes              Current Medications   Current Facility-Administered Medications:   0.9 %  sodium chloride infusion (Manually program via Guardrails IV Fluids), , Intravenous, Once, [name], MD   acetaminophen (TYLENOL) tablet 650 mg, 650 mg, Oral, Q6H PRN **OR** acetaminophen (TYLENOL) suppository 650 mg, 650 mg, Rectal, Q6H PRN, Jessy Oto, MD   acetaminophen (TYLENOL) tablet 325-650 mg, 325-650 mg, Oral, Q6H PRN, Jessy Oto, MD, 650 mg at 08/31/20 4259   allopurinol (ZYLOPRIM) tablet 100 mg, 100 mg, Oral, BID, Jessy Oto, MD, 100 mg at 09/01/20 [phone]  alum & mag hydroxide-simeth (MAALOX/MYLANTA) 200-200-20 MG/5ML suspension 15-30 mL, 15-30 mL, Oral, Q2H PRN, Jessy Oto, MD, 30 mL at 08/30/20 2202   amoxicillin (AMOXIL) capsule 500 mg, 500 mg, Oral, Q8H, Florencia Reasons, MD, 500 mg at 09/01/20 7169   ascorbic acid (VITAMIN C) tablet 1,000 mg, 1,000 mg, Oral, Daily, Jessy Oto, MD, 1,000 mg at 09/01/20 0915   bisacodyl (DULCOLAX) EC tablet 5 mg, 5 mg, Oral, Daily PRN, Jessy Oto, MD   chlorproMAZINE (THORAZINE) 12.5 mg in sodium chloride 0.9 % 25 mL IVPB, 12.5 mg, Intravenous, Q6H PRN, Nicole Kindred A, DO   diphenhydrAMINE (BENADRYL) 12.5 MG/5ML elixir 12.5-25 mg, 12.5-25 mg, Oral, Q4H PRN, Jessy Oto, MD   diphenhydrAMINE (BENADRYL) capsule 25 mg, 25 mg, Oral, QHS PRN, Jessy Oto, MD   docusate sodium (COLACE) capsule 100  mg, 100 mg, Oral, Daily, Jessy Oto, MD, 100 mg at 08/30/20 0912   enoxaparin (LOVENOX) injection 40 mg, 40 mg, Subcutaneous, Q24H, Jessy Oto, MD, 40 mg at 09/01/20 6789   ferrous sulfate tablet 325 mg, 325 mg, Oral, TID PC, Jessy Oto, MD, 325 mg at 09/01/20 0915   gabapentin (NEURONTIN) capsule 300 mg, 300 mg, Oral, TID, Jessy Oto, MD, 300 mg at 09/01/20 0949   guaiFENesin-dextromethorphan (ROBITUSSIN DM) 100-10 MG/5ML syrup 15 mL, 15 mL, Oral, Q4H PRN, Jessy Oto, MD   hydrALAZINE (APRESOLINE) injection 5 mg, 5 mg, Intravenous, Q20 Min PRN, Jessy Oto, MD   HYDROmorphone (DILAUDID) injection 0.5-1 mg, 0.5-1 mg, Intravenous, Q4H PRN, Jessy Oto, MD   insulin aspart (novoLOG) injection 0-15 Units, 0-15 Units, Subcutaneous, TID WC, Griffith, Kelly A, DO   insulin aspart (novoLOG) injection 4 Units, 4 Units, Subcutaneous, TID WC, Griffith, Kelly A, DO, 4 Units at 09/01/20 0921   insulin glargine (LANTUS) injection 25 Units, 25 Units, Subcutaneous, BID, Ezekiel Slocumb, DO, 25 Units at 09/01/20 3810   labetalol (NORMODYNE) injection 10 mg, 10 mg, Intravenous, Q10 min PRN, Jessy Oto, MD   linezolid (ZYVOX) tablet 600 mg, 600 mg, Oral, Q12H, Nicole Kindred A, DO, 600 mg at 09/01/20 1751   magnesium sulfate IVPB 2 g 50 mL, 2 g, Intravenous, Daily PRN, Jessy Oto, MD   metoprolol tartrate (LOPRESSOR) injection 2-5 mg, 2-5 mg, Intravenous, Q2H PRN, Jessy Oto, MD   metoprolol tartrate (LOPRESSOR) tablet 12.5 mg, 12.5 mg, Oral, BID, Florencia Reasons, MD, 12.5 mg at 09/01/20 0258   multivitamin with minerals tablet 1 tablet, 1 tablet, Oral, Daily, Jessy Oto, MD, 1 tablet at 09/01/20 0915   ondansetron (ZOFRAN) injection 4 mg, 4 mg, Intravenous, Q6H PRN, Jessy Oto, MD   oxyCODONE (Oxy IR/ROXICODONE) immediate release tablet 10-15 mg, 10-15 mg, Oral, Q4H PRN, Nicole Kindred A, DO, 15 mg at 09/01/20 0913   pantoprazole (PROTONIX) EC tablet 40 mg, 40 mg, Oral,  Daily, Jessy Oto, MD, 40 mg at 09/01/20 0915   phenol (CHLORASEPTIC) mouth spray 1 spray, 1 spray, Mouth/Throat, PRN, Jessy Oto, MD   polyethylene glycol (MIRALAX / GLYCOLAX) packet 17 g, 17 g, Oral, Daily, Nicole Kindred A, DO, 17 g at 08/30/20 1724   potassium chloride SA (KLOR-CON) CR tablet 20-40 mEq, 20-40 mEq, Oral, Daily PRN, Jessy Oto, MD   protein supplement (ENSURE MAX) liquid, 11 oz, Oral, BID, Jessy Oto, MD, 11 oz at 08/31/20 2110   sodium phosphate (FLEET) 7-19 GM/118ML enema 1 enema, 1 enema, Rectal,  Once PRN, Jessy Oto, MD   tiZANidine (ZANAFLEX) tablet 4 mg, 4 mg, Oral, QHS, Jessy Oto, MD, 4 mg at 08/31/20 2113   vancomycin (VANCOCIN) capsule 125 mg, 125 mg, Oral, BID, Florencia Reasons, MD, 125 mg at 09/01/20 0919   zinc sulfate capsule 220 mg, 220 mg, Oral, Daily, Jessy Oto, MD, 220 mg at 09/01/20 0915   Patients Current Diet:  Diet Order                  Diet - low sodium heart healthy             Diet Carb Modified Fluid consistency: Thin; Room service appropriate? Yes  Diet effective now                         Precautions / Restrictions Precautions Precautions: Fall Precaution Comments: hemovac Restrictions Weight Bearing Restrictions: Yes RLE Weight Bearing: Non weight bearing Other Position/Activity Restrictions: Per ortho note 7/11, d/c knee immobilizer    Has the patient had 2 or more falls or a fall with injury in the past year? No   Prior Activity Level Community (5-7x/wk): Mod I with crutches a couple of months   Prior Functional Level Self Care: Did the patient need help bathing, dressing, using the toilet or eating? Independent   Indoor Mobility: Did the patient need assistance with walking from room to room (with or without device)? Independent   Stairs: Did the patient need assistance with internal or external stairs (with or without device)? Independent   Functional Cognition: Did the patient need help planning  regular tasks such as shopping or remembering to take medications? [place] Devices/Equipment: Crutches Home Equipment: Crutches   Prior Device Use: Indicate devices/aids used by the patient prior to current illness, exacerbation or injury? crutches   Current Functional Level Cognition   Overall Cognitive Status: Within Functional Limits for tasks assessed Orientation Level: Oriented X4    Extremity Assessment (includes Sensation/Coordination)   Upper Extremity Assessment: Overall WFL for tasks assessed  Lower Extremity Assessment: Defer to PT evaluation RLE Deficits / Details: BKA with decreased awareness with transfers     ADLs   Overall ADL's : Needs assistance/impaired Eating/Feeding: Independent, Sitting Grooming: Independent, Sitting Upper Body Bathing: Min guard, Sitting Lower Body Bathing: Minimal assistance, Sitting/lateral leans Upper Body Dressing : Independent, Sitting Lower Body Dressing: Minimal assistance, Sitting/lateral leans Toilet Transfer: Min guard, Transfer board Toileting- Water quality scientist and Hygiene: Min guard, Sitting/lateral lean Tub/ Banker: Min guard, Transfer board Functional mobility during ADLs: Min guard General ADL Comments: Pt focused on bed mobility and exercises this session     Mobility   Overal bed mobility: Needs Assistance Bed Mobility: Rolling, Sidelying to Sit, Sit to Supine Rolling: Min guard Sidelying to sit: Min assist Supine to sit: Min assist, HOB elevated Sit to supine: Min guard General bed mobility comments: Min A to push up into sitting, limited due to pain in R groin     Transfers   Overall transfer level: Needs assistance Transfers: Lateral/Scoot Transfers Sit to Stand: Mod assist, From elevated surface  Lateral/Scoot Transfers: Min assist General transfer comment: 1 Lat scoot to reposition in bed, deferred further mobility due to pain in R groin      Ambulation / Gait / Stairs / Wheelchair Mobility   Ambulation/Gait General Gait Details: unable     Posture / Balance Balance Overall  balance assessment: No apparent balance deficits (not formally assessed) Sitting-balance support: No upper extremity supported Sitting balance-Leahy Scale: Good     Special needs/care consideration Hgb A1c 7.8    Previous Home Environment  Living Arrangements: Non-relatives/Friends Available Help at Discharge: Friend(s), Available 24 hours/day Type of Home: House Home Layout: Bed/bath upstairs, 1/2 bath on main level Home Access: Level entry Bathroom Shower/Tub: Chiropodist: Standard Home Care Services: No Additional Comments: couch on main floor   Discharge Living Setting Plans for Discharge Living Setting: Lives with (comment) (has lived with friends for around 6 months due to medical issues; 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) Type of Home at Discharge: House Discharge Home Layout: Two level, 1/2 bath on main level, Bed/bath upstairs (has couch on main level floor) Alternate Level Stairs-Number of Steps: flight Discharge Home Access: Level entry Discharge Bathroom Shower/Tub: Tub/shower unit Discharge Bathroom Toilet: Standard Discharge Bathroom Accessibility: Yes How Accessible: Accessible via walker Does the patient have any problems obtaining your medications?: No   Social/Family/Support Systems Contact Information: Kevin Fenton Anticipated Caregiver: friends prn Anticipated Caregiver's Contact Information: see above Ability/Limitations of Caregiver: friends work Building control surveyor Availability: Intermittent Discharge Plan Discussed with Primary Caregiver: Yes Is Caregiver In Agreement with Plan?: Yes Does Caregiver/Family have Issues with Lodging/Transportation while Pt is in Rehab?: No   Goals Patient/Family Goal for Rehab: Mod I at wheelchair level with PT and OT Expected length of stay:  ELOS 7 to 10 days Pt/Family Agrees to Admission and willing to participate: Yes Program Orientation Provided & Reviewed with Pt/Caregiver Including Roles  & Responsibilities: Yes   Decrease burden of Care through IP rehab admission: n/a   Possible need for SNF placement upon discharge: not anticipated   Patient Condition: I have reviewed medical records from [facility] , spoken with CM, and patient. I met with patient at the bedside for inpatient rehabilitation assessment.  Patient will benefit from ongoing PT and OT, can actively participate in 3 hours of therapy a day 5 days of the week, and can make measurable gains during the admission.  Patient will also benefit from the coordinated team approach during an Inpatient Acute Rehabilitation admission.  The patient will receive intensive therapy as well as Rehabilitation physician, nursing, social worker, and care management interventions.  Due to bladder management, bowel management, safety, skin/wound care, disease management, medication administration, pain management, and patient education the patient requires 24 hour a day rehabilitation nursing.  The patient is currently mod assist overall with mobility and basic ADLs.  Discharge setting and therapy post discharge at home with home health is anticipated.  Patient has agreed to participate in the Acute Inpatient Rehabilitation Program and will admit today.   Preadmission Screen Completed By:  Cleatrice Burke, 09/01/2020 12:21 PM ______________________________________________________________________   Discussed status with Dr. Dagoberto Ligas on  09/01/2020 at 70 and received approval for admission today.   Admission Coordinator:  Cleatrice Burke, RN, time  1749 Date  09/01/2020   Assessment/Plan: Diagnosis: Does the need for close, 24 hr/day Medical supervision in concert with the patient's rehab needs make it unreasonable for this patient to be served in a less intensive  setting? Yes Co-Morbidities requiring supervision/potential complications: R BKA; ABLA- s/p 4 units pRBCs, acute R elbow gout; Cdiff- finishing PO Vanc; complicated UTI- on Zyvox/Amoxicillin; DM; CKD Stage III Due to bladder management, bowel management, safety, skin/wound care, disease management, medication administration, pain management, and patient education, does the  patient require 24 hr/day rehab nursing? Yes Does the patient require coordinated care of a physician, rehab nurse, PT, OT, and SLP to address physical and functional deficits in the context of the above medical diagnosis(es)? Yes Addressing deficits in the following areas: balance, endurance, locomotion, strength, transferring, bowel/bladder control, bathing, dressing, feeding, grooming, and toileting Can the patient actively participate in an intensive therapy program of at least 3 hrs of therapy 5 days a week? Yes The potential for patient to make measurable gains while on inpatient rehab is good Anticipated functional outcomes upon discharge from inpatient rehab: modified independent and supervision PT, modified independent and supervision OT, n/a SLP Estimated rehab length of stay to reach the above functional goals is: 7-10 days Anticipated discharge destination: Home 10. Overall Rehab/Functional Prognosis: good     MD Signature:            Revision History                        Note Details  Jan Fireman, MD File Time 09/01/2020 12:54 PM  Author Type Physician Status Signed  Last Editor Courtney Heys, MD Service Physical Medicine and [place] # [phone] Admit Date 09/01/2020

## 2020-09-01 NOTE — Progress Notes (Signed)
Inpatient Rehabilitation Admissions Coordinator   I met with patient at bedside to discuss potential admit to CIR today pending Dr. Freddi Starr clearance to discharge and his ability to work with therapy today. He is in agreement and wishes to pursue admit asap.   Dr Arbutus Ped has given approval as well as Dr Louanne Skye. I will make the arrangements to admit today. Acute team and TOC made aware.  Danne Baxter, RN, MSN Rehab Admissions Coordinator [phone] 09/01/2020 11:44 AM

## 2020-09-01 NOTE — Progress Notes (Signed)
Occupational Therapy Treatment Patient Details Name: Patrick [name] MRN: [id number] DOB: 1965/06/01 Today's Date: 09/01/2020    History of present illness 55 yo admitted 7/8 with sepsis due to Rt foot. 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 PMHx: DM, HTN, gout, Rth 4th and 5th ray amputations   OT comments  Pt limited by pain this session, deferring all OOB activities. Pt tolerated bed mobility to reposition in bed, with 1 lateral scoot, then reported his pain level was too high. Pt also worked on exercises to increase strength, mobility, and activity tolerance, exercises complete are listed below. Acute OT will continue to follow.    Follow Up Recommendations  CIR    Equipment Recommendations  None recommended by OT    Recommendations for Other Services Rehab consult    Precautions / Restrictions Precautions Precautions: Fall Restrictions Weight Bearing Restrictions: Yes RLE Weight Bearing: Non weight bearing       Mobility Bed Mobility Overal bed mobility: Needs Assistance Bed Mobility: Rolling;Sidelying to Sit;Sit to Supine Rolling: Min guard Sidelying to sit: Min assist   Sit to supine: Min guard   General bed mobility comments: Min A to push up into sitting, limited due to pain in R groin    Transfers Overall transfer level: Needs assistance   Transfers: Lateral/Scoot Transfers          Lateral/Scoot Transfers: Min assist General transfer comment: 1 Lat scoot to reposition in bed, deferred further mobility due to pain in R groin    Balance Overall balance assessment: No apparent balance deficits (not formally assessed)                                         ADL either performed or assessed with clinical judgement   ADL Overall ADL's : Needs assistance/impaired                                       General ADL Comments: Pt focused on bed mobility and exercises this session     Vision       Perception     Praxis      Cognition  Arousal/Alertness: Awake/alert Behavior During Therapy: WFL for tasks assessed/performed;Flat affect Overall Cognitive Status: Within Functional Limits for tasks assessed                                          Exercises Exercises: Other exercises;Amputee Amputee Exercises Quad Sets: AROM;10 reps;Supine Gluteal Sets: AROM;10 reps;Supine Knee Flexion: AROM;10 reps;Supine Knee Extension: AROM;10 reps;Supine Other Exercises Other Exercises: Pulling up to sitting, engaging core, 5 reps Other Exercises: scapular rotation, arm press ups, 5 reps, supine Other Exercises: reach across and pulls, 5 reps   Shoulder Instructions       General Comments BP elevated 182/103    Pertinent Vitals/ Pain       Pain Assessment: 0-10 Pain Score: 8  Pain Location: R groin Pain Descriptors / Indicators: Throbbing;Discomfort;Grimacing;Guarding Pain Intervention(s): Limited activity within patient's tolerance;Monitored during session;Repositioned;Premedicated before session  Home Living  Prior Functioning/Environment              Frequency  Min 2X/week        Progress Toward Goals  OT Goals(current goals can now be found in the care plan section)  Progress towards OT goals: Progressing toward goals  Acute Rehab OT Goals Patient Stated Goal: Get to work in August OT Goal Formulation: With patient Time For Goal Achievement: 09/11/20 Potential to Achieve Goals: Good ADL Goals Pt Will Perform Lower Body Bathing: with modified independence;with adaptive equipment;sitting/lateral leans Pt Will Perform Lower Body Dressing: with modified independence;with adaptive equipment;sitting/lateral leans Pt Will Transfer to Toilet: with modified independence;with transfer board Pt Will Perform Toileting - Clothing Manipulation and hygiene: with modified independence;sitting/lateral leans  Plan Discharge plan remains  appropriate;Frequency remains appropriate    Co-evaluation                 AM-PAC OT "6 Clicks" Daily Activity     Outcome Measure   Help from another person eating meals?: None Help from another person taking care of personal grooming?: None Help from another person toileting, which includes using toliet, bedpan, or urinal?: A Little Help from another person bathing (including washing, rinsing, drying)?: A Little Help from another person to put on and taking off regular upper body clothing?: A Little Help from another person to put on and taking off regular lower body clothing?: A Little 6 Click Score: 20    End of Session    OT Visit Diagnosis: Unsteadiness on feet (R26.81);Other abnormalities of gait and mobility (R26.89);Muscle weakness (generalized) (M62.81)   Activity Tolerance Patient limited by pain   Patient Left in bed;with call bell/phone within reach;with bed alarm set   Nurse Communication Mobility status        Time: [id number] OT Time Calculation (min): 23 min  Charges: OT General Charges $OT Visit: 1 Visit OT Treatments $Therapeutic Activity: 8-22 mins $Therapeutic Exercise: 8-22 mins  Winfred Iiams H., OTR/L Acute Rehabilitation  Jemery Stacey Elane Texie Tupou 09/01/2020, 12:00 PM

## 2020-09-01 NOTE — Progress Notes (Signed)
Inpatient Rehabilitation Medication Review by a Pharmacist  A complete drug regimen review was completed for this patient to identify any potential clinically significant medication issues.  Clinically significant medication issues were identified:  no  Check AMION for pharmacist assigned to patient if future medication questions/issues arise during this admission.  Pharmacist comments:   Time spent performing this drug regimen review (minutes):  18   Keanu Frickey 09/01/2020 5:13 PM

## 2020-09-01 NOTE — TOC Transition Note (Signed)
Transition of Care [facility]) - CM/SW Discharge Note   Patient Details  Name: Patrick [name] MRN: [id number] Date of Birth: [birth year]  Transition of Care [facility]) CM/SW Contact:  Zenon Mayo, RN Phone Number: 09/01/2020, 1:09 PM   Clinical Narrative:    Patient for dc to CIR today.   Final next level of care: IP Rehab Facility Barriers to Discharge: No Barriers Identified   Patient Goals and CMS Choice Patient states their goals for this hospitalization and ongoing recovery are:: to go to rehab once stable.   Choice offered to / list presented to : NA  Discharge Placement                       Discharge Plan and Services In-house Referral: NA Discharge Planning Services: CM Consult Post Acute Care Choice: IP Rehab            DME Agency: NA                  Social Determinants of Health (SDOH) Interventions     Readmission Risk Interventions Readmission Risk Prevention Plan 08/29/2020  Transportation Screening Complete  PCP or Specialist Appt within 3-5 Days Complete  HRI or [place] Complete  Social Work Consult for [place] Planning/Counseling Complete  Palliative Care Screening Not Applicable  Medication Review Press photographer) Complete  Some recent data might be hidden

## 2020-09-01 NOTE — PMR Pre-admission (Signed)
PMR Admission Coordinator Pre-Admission Assessment  Patient: Patrick [name] is an 55 y.o., male MRN: [id number] DOB: 1965-02-25 Height: '6\' 4"'  (193 cm) Weight: (!) 142.6 kg Insurance Information HMO:     PPO:      PCP:      IPA:      80/20:      OTHER:   Patient states his insurance is through the end of the month of July only. No longer working for this company. To start a new job in August  PRIMARY: [place]      Policy#: [id number]      Subscriber: pt CM Name: Aaron Edelman      Phone#: [phone]     Fax#: [phone] Pre-Cert#: [id number] approved for 7 days with f/u with [facility] phone [phone] fax [phone]      Employer:  Benefits:  Phone #: [phone]     Name: 7/14 Eff. Date: 04/19/2020     Deduct: $3000      Out of Pocket Max: [phone]      Life Max: none CIR: 70%      SNF: 70% 60 days per year Outpatient: $30 per visit     Co-Pay: 20 visits per year combined Home Health: 70%      Co-Pay: 60 visits per year DME: 70%     Co-Pay: 30% Providers: in network  SECONDARY: none        Financial Counselor:       Phone#:   The Engineer, [employer]" for patients in Inpatient Rehabilitation Facilities with attached "Privacy Act [place] Records" was provided and verbally reviewed with: N/A  Emergency Contact Information Contact Information     Name Relation Home Work Mobile   Dou[place] Ana Friend   [phone]       Current Medical History  Patient Admitting Diagnosis: BKA  History of Present Illness:  55 year old right-handed male with history of diabetes mellitus, gout, chronic anemia with hemoglobin 8-9, obesity with BMI of 38.27, CKD stage III, hypertension, PVD with right fourth and fifth ray amputations 05/2020, recent COVID 04/2019 as well as a recent C. difficile 06/2020 treated with p.o. vancomycin.    Presented 08/26/2020 with low-grade fever/tachycardia as well as nonhealing right foot ulcer followed at the [facility] wound care  clinic and has undergone multiple debridements.  Admission chemistry sodium 134, BUN 32, creatinine 2.27, WBC 14,000, hemoglobin 6.0, lactic acid 1.3.  Blood cultures obtained 1/4 grew staph, urine culture 1000 colony of Enterococcus.  Prior urine culture from admission with VRE and started on Zyvox.  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 Louanne Skye.  His Hemovac was discontinued 08/29/2020.  Initially maintained on broad-spectrum antibiotics simplified to amoxicillin completing course as directed.  Hospital course acute blood loss anemia 6.5 and has received a total of 4 units packed red blood cells with latest hemoglobin 7.5 as well as patient did have one positive Hemoccult stool and monitored.   Patient developed right elbow gout pain with uric acid 11.2 and placed on colchicine as well as Zyloprim.  He was cleared to begin Lovenox for DVT prophylaxis 08/28/2020.  In regards to patient's recent C. difficile cleared by infectious disease that he no longer needs contact precautions and would complete course of p.o. vancomycin.   Patient's medical record from [facility] has been reviewed by the rehabilitation admission coordinator and physician.  Past Medical History  Past Medical History:  Diagnosis Date   Diabetes mellitus    Gout    History of COVID-19 04/2019   Hypertension     Family History   family history is not on file.  Prior Rehab/Hospitalizations Has the patient had prior rehab or hospitalizations prior to admission? Yes  Has the patient had major surgery during 100 days prior to admission? Yes   Current Medications  Current Facility-Administered Medications:    0.9 %  sodium chloride infusion (Manually program via Guardrails IV Fluids), , Intravenous, Once, [name], MD   acetaminophen (TYLENOL) tablet 650 mg, 650 mg, Oral, Q6H PRN **OR** acetaminophen (TYLENOL) suppository 650 mg, 650 mg, Rectal,  Q6H PRN, Jessy Oto, MD   acetaminophen (TYLENOL) tablet 325-650 mg, 325-650 mg, Oral, Q6H PRN, Jessy Oto, MD, 650 mg at 08/31/20 2025   allopurinol (ZYLOPRIM) tablet 100 mg, 100 mg, Oral, BID, Jessy Oto, MD, 100 mg at 09/01/20 0915   alum & mag hydroxide-simeth (MAALOX/MYLANTA) 200-200-20 MG/5ML suspension 15-30 mL, 15-30 mL, Oral, Q2H PRN, Jessy Oto, MD, 30 mL at 08/30/20 2202   amoxicillin (AMOXIL) capsule 500 mg, 500 mg, Oral, Q8H, Florencia Reasons, MD, 500 mg at 09/01/20 4270   ascorbic acid (VITAMIN C) tablet 1,000 mg, 1,000 mg, Oral, Daily, Jessy Oto, MD, 1,000 mg at 09/01/20 0915   bisacodyl (DULCOLAX) EC tablet 5 mg, 5 mg, Oral, Daily PRN, Jessy Oto, MD   chlorproMAZINE (THORAZINE) 12.5 mg in sodium chloride 0.9 % 25 mL IVPB, 12.5 mg, Intravenous, Q6H PRN, Nicole Kindred A, DO   diphenhydrAMINE (BENADRYL) 12.5 MG/5ML elixir 12.5-25 mg, 12.5-25 mg, Oral, Q4H PRN, Jessy Oto, MD   diphenhydrAMINE (BENADRYL) capsule 25 mg, 25 mg, Oral, QHS PRN, Jessy Oto, MD   docusate sodium (COLACE) capsule 100 mg, 100 mg, Oral, Daily, Jessy Oto, MD, 100 mg at 08/30/20 0912   enoxaparin (LOVENOX) injection 40 mg, 40 mg, Subcutaneous, Q24H, Jessy Oto, MD, 40 mg at 09/01/20 6237   ferrous sulfate tablet 325 mg, 325 mg, Oral, TID PC, Jessy Oto, MD, 325 mg at 09/01/20 0915   gabapentin (NEURONTIN) capsule 300 mg, 300 mg, Oral, TID, Jessy Oto, MD, 300 mg at 09/01/20 0949   guaiFENesin-dextromethorphan (ROBITUSSIN DM) 100-10 MG/5ML syrup 15 mL, 15 mL, Oral, Q4H PRN, Jessy Oto, MD   hydrALAZINE (APRESOLINE) injection 5 mg, 5 mg, Intravenous, Q20 Min PRN, Jessy Oto, MD   HYDROmorphone (DILAUDID) injection 0.5-1 mg, 0.5-1 mg, Intravenous, Q4H PRN, Jessy Oto, MD   insulin aspart (novoLOG) injection 0-15 Units, 0-15 Units, Subcutaneous, TID WC, Griffith, Kelly A, DO   insulin aspart (novoLOG) injection 4 Units, 4 Units, Subcutaneous, TID WC, Griffith,  Kelly A, DO, 4 Units at 09/01/20 0921   insulin glargine (LANTUS) injection 25 Units, 25 Units, Subcutaneous, BID, Ezekiel Slocumb, DO, 25 Units at 09/01/20 6283   labetalol (NORMODYNE) injection 10 mg, 10 mg, Intravenous, Q10 min PRN, Jessy Oto, MD   linezolid (ZYVOX) tablet 600 mg, 600 mg, Oral, Q12H, Nicole Kindred A, DO, 600 mg at 09/01/20 1517   magnesium sulfate IVPB 2 g 50 mL, 2 g, Intravenous, Daily PRN, Jessy Oto, MD   metoprolol tartrate (LOPRESSOR) injection 2-5 mg, 2-5 mg, Intravenous, Q2H PRN, Jessy Oto, MD   metoprolol tartrate (LOPRESSOR) tablet 12.5 mg, 12.5 mg, Oral, BID, Florencia Reasons, MD, 12.5 mg at 09/01/20 0914   multivitamin with minerals tablet 1 tablet, 1 tablet,  Oral, Daily, Jessy Oto, MD, 1 tablet at 09/01/20 0915   ondansetron (ZOFRAN) injection 4 mg, 4 mg, Intravenous, Q6H PRN, Jessy Oto, MD   oxyCODONE (Oxy IR/ROXICODONE) immediate release tablet 10-15 mg, 10-15 mg, Oral, Q4H PRN, Nicole Kindred A, DO, 15 mg at 09/01/20 0913   pantoprazole (PROTONIX) EC tablet 40 mg, 40 mg, Oral, Daily, Jessy Oto, MD, 40 mg at 09/01/20 0915   phenol (CHLORASEPTIC) mouth spray 1 spray, 1 spray, Mouth/Throat, PRN, Jessy Oto, MD   polyethylene glycol (MIRALAX / GLYCOLAX) packet 17 g, 17 g, Oral, Daily, Nicole Kindred A, DO, 17 g at 08/30/20 1724   potassium chloride SA (KLOR-CON) CR tablet 20-40 mEq, 20-40 mEq, Oral, Daily PRN, Jessy Oto, MD   protein supplement (ENSURE MAX) liquid, 11 oz, Oral, BID, Jessy Oto, MD, 11 oz at 08/31/20 2110   sodium phosphate (FLEET) 7-19 GM/118ML enema 1 enema, 1 enema, Rectal, Once PRN, Jessy Oto, MD   tiZANidine (ZANAFLEX) tablet 4 mg, 4 mg, Oral, QHS, Jessy Oto, MD, 4 mg at 08/31/20 2113   vancomycin (VANCOCIN) capsule 125 mg, 125 mg, Oral, BID, Florencia Reasons, MD, 125 mg at 09/01/20 0919   zinc sulfate capsule 220 mg, 220 mg, Oral, Daily, Jessy Oto, MD, 220 mg at 09/01/20 0915  Patients Current  Diet:  Diet Order             Diet - low sodium heart healthy           Diet Carb Modified Fluid consistency: Thin; Room service appropriate? Yes  Diet effective now                   Precautions / Restrictions Precautions Precautions: Fall Precaution Comments: hemovac Restrictions Weight Bearing Restrictions: Yes RLE Weight Bearing: Non weight bearing Other Position/Activity Restrictions: Per ortho note 7/11, d/c knee immobilizer   Has the patient had 2 or more falls or a fall with injury in the past year? No  Prior Activity Level Community (5-7x/wk): Mod I with crutches a couple of months  Prior Functional Level Self Care: Did the patient need help bathing, dressing, using the toilet or eating? Independent  Indoor Mobility: Did the patient need assistance with walking from room to room (with or without device)? Independent  Stairs: Did the patient need assistance with internal or external stairs (with or without device)? Independent  Functional Cognition: Did the patient need help planning regular tasks such as shopping or remembering to take medications? Independent  Home Assistive Devices / Equipment Home Assistive Devices/Equipment: Crutches Home Equipment: Crutches  Prior Device Use: Indicate devices/aids used by the patient prior to current illness, exacerbation or injury? crutches  Current Functional Level Cognition  Overall Cognitive Status: Within Functional Limits for tasks assessed Orientation Level: Oriented X4    Extremity Assessment (includes Sensation/Coordination)  Upper Extremity Assessment: Overall WFL for tasks assessed  Lower Extremity Assessment: Defer to PT evaluation RLE Deficits / Details: BKA with decreased awareness with transfers    ADLs  Overall ADL's : Needs assistance/impaired Eating/Feeding: Independent, Sitting Grooming: Independent, Sitting Upper Body Bathing: Min guard, Sitting Lower Body Bathing: Minimal assistance,  Sitting/lateral leans Upper Body Dressing : Independent, Sitting Lower Body Dressing: Minimal assistance, Sitting/lateral leans Toilet Transfer: Min guard, Transfer board Toileting- Water quality scientist and Hygiene: Min guard, Sitting/lateral lean Tub/ Shower Transfer: Min guard, Transfer board Functional mobility during ADLs: Min guard General ADL Comments: Pt focused on bed mobility and exercises  this session    Mobility  Overal bed mobility: Needs Assistance Bed Mobility: Rolling, Sidelying to Sit, Sit to Supine Rolling: Min guard Sidelying to sit: Min assist Supine to sit: Min assist, HOB elevated Sit to supine: Min guard General bed mobility comments: Min A to push up into sitting, limited due to pain in R groin    Transfers  Overall transfer level: Needs assistance Transfers: Lateral/Scoot Transfers Sit to Stand: Mod assist, From elevated surface  Lateral/Scoot Transfers: Min assist General transfer comment: 1 Lat scoot to reposition in bed, deferred further mobility due to pain in R groin    Ambulation / Gait / Stairs / Wheelchair Mobility  Ambulation/Gait General Gait Details: unable    Posture / Balance Balance Overall balance assessment: No apparent balance deficits (not formally assessed) Sitting-balance support: No upper extremity supported Sitting balance-Leahy Scale: Good    Special needs/care consideration Hgb A1c 7.8   Previous Home Environment  Living Arrangements: Non-relatives/Friends Available Help at Discharge: Friend(s), Available 24 hours/day Type of Home: House Home Layout: Bed/bath upstairs, 1/2 bath on main level Home Access: Level entry Bathroom Shower/Tub: Chiropodist: Standard Home Care Services: No Additional Comments: couch on main floor  Discharge Living Setting Plans for Discharge Living Setting: Lives with (comment) (has lived with friends for around 6 months due to medical issues; 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) Type of Home at Discharge: House Discharge Home Layout: Two level, 1/2 bath on main level, Bed/bath upstairs (has couch on main level floor) Alternate Level Stairs-Number of Steps: flight Discharge Home Access: Level entry Discharge Bathroom Shower/Tub: Tub/shower unit Discharge Bathroom Toilet: Standard Discharge Bathroom Accessibility: Yes How Accessible: Accessible via walker Does the patient have any problems obtaining your medications?: No  Social/Family/Support Systems Contact Information: Kevin Fenton Anticipated Caregiver: friends prn Anticipated Caregiver's Contact Information: see above Ability/Limitations of Caregiver: friends work Building control surveyor Availability: Intermittent Discharge Plan Discussed with Primary Caregiver: Yes Is Caregiver In Agreement with Plan?: Yes Does Caregiver/Family have Issues with Lodging/Transportation while Pt is in Rehab?: No  Goals Patient/Family Goal for Rehab: Mod I at wheelchair level with PT and OT Expected length of stay: ELOS 7 to 10 days Pt/Family Agrees to Admission and willing to participate: Yes Program Orientation Provided & Reviewed with Pt/Caregiver Including Roles  & Responsibilities: Yes  Decrease burden of Care through IP rehab admission: n/a  Possible need for SNF placement upon discharge: not anticipated  Patient Condition: I have reviewed medical records from [facility] , spoken with CM, and patient. I met with patient at the bedside for inpatient rehabilitation assessment.  Patient will benefit from ongoing PT and OT, can actively participate in 3 hours of therapy a day 5 days of the week, and can make measurable gains during the admission.  Patient will also benefit from the coordinated team approach during an Inpatient Acute Rehabilitation admission.  The patient will receive intensive therapy as well as Rehabilitation physician, nursing, social worker, and care management interventions.   Due to bladder management, bowel management, safety, skin/wound care, disease management, medication administration, pain management, and patient education the patient requires 24 hour a day rehabilitation nursing.  The patient is currently mod assist overall with mobility and basic ADLs.  Discharge setting and therapy post discharge at home with home health is anticipated.  Patient has agreed to participate in the Acute Inpatient Rehabilitation Program and will admit today.  Preadmission Screen Completed By:  Danne Baxter  Jefm Bryant, 09/01/2020 12:21 PM ______________________________________________________________________   Discussed status with Dr. Dagoberto Ligas on  09/01/2020 at 61 and received approval for admission today.  Admission Coordinator:  Cleatrice Burke, RN, time  8592 Date  09/01/2020   Assessment/Plan: Diagnosis: Does the need for close, 24 hr/day Medical supervision in concert with the patient's rehab needs make it unreasonable for this patient to be served in a less intensive setting? Yes Co-Morbidities requiring supervision/potential complications: R BKA; ABLA- s/p 4 units pRBCs, acute R elbow gout; Cdiff- finishing PO Vanc; complicated UTI- on Zyvox/Amoxicillin; DM; CKD Stage III Due to bladder management, bowel management, safety, skin/wound care, disease management, medication administration, pain management, and patient education, does the patient require 24 hr/day rehab nursing? Yes Does the patient require coordinated care of a physician, rehab nurse, PT, OT, and SLP to address physical and functional deficits in the context of the above medical diagnosis(es)? Yes Addressing deficits in the following areas: balance, endurance, locomotion, strength, transferring, bowel/bladder control, bathing, dressing, feeding, grooming, and toileting Can the patient actively participate in an intensive therapy program of at least 3 hrs of therapy 5 days a week? Yes The potential for  patient to make measurable gains while on inpatient rehab is good Anticipated functional outcomes upon discharge from inpatient rehab: modified independent and supervision PT, modified independent and supervision OT, n/a SLP Estimated rehab length of stay to reach the above functional goals is: 7-10 days Anticipated discharge destination: Home 10. Overall Rehab/Functional Prognosis: good   MD Signature:

## 2020-09-02 LAB — CBC WITH DIFFERENTIAL/PLATELET
Abs Immature Granulocytes: 0.09 10*3/uL — ABNORMAL HIGH (ref 0.00–0.07)
Basophils Absolute: 0.1 10*3/uL (ref 0.0–0.1)
Basophils Relative: 1 %
Eosinophils Absolute: 0.4 10*3/uL (ref 0.0–0.5)
Eosinophils Relative: 4 %
HCT: 24.9 % — ABNORMAL LOW (ref 39.0–52.0)
Hemoglobin: 7.7 g/dL — ABNORMAL LOW (ref 13.0–17.0)
Immature Granulocytes: 1 %
Lymphocytes Relative: 24 %
Lymphs Abs: 2.5 10*3/uL (ref 0.7–4.0)
MCH: 25.9 pg — ABNORMAL LOW (ref 26.0–34.0)
MCHC: 30.9 g/dL (ref 30.0–36.0)
MCV: 83.8 fL (ref 80.0–100.0)
Monocytes Absolute: 0.9 10*3/uL (ref 0.1–1.0)
Monocytes Relative: 9 %
Neutro Abs: 6.3 10*3/uL (ref 1.7–7.7)
Neutrophils Relative %: 61 %
Platelets: 559 10*3/uL — ABNORMAL HIGH (ref 150–400)
RBC: 2.97 MIL/uL — ABNORMAL LOW (ref 4.22–5.81)
RDW: 17.2 % — ABNORMAL HIGH (ref 11.5–15.5)
WBC: 10.2 10*3/uL (ref 4.0–10.5)
nRBC: 0 % (ref 0.0–0.2)

## 2020-09-02 LAB — GLUCOSE, CAPILLARY
Glucose-Capillary: 110 mg/dL — ABNORMAL HIGH (ref 70–99)
Glucose-Capillary: 88 mg/dL (ref 70–99)
Glucose-Capillary: 60 mg/dL — ABNORMAL LOW (ref 70–99)
Glucose-Capillary: 85 mg/dL (ref 70–99)
Glucose-Capillary: 102 mg/dL — ABNORMAL HIGH (ref 70–99)

## 2020-09-02 LAB — COMPREHENSIVE METABOLIC PANEL
ALT: 38 U/L (ref 0–44)
AST: 43 U/L — ABNORMAL HIGH (ref 15–41)
Albumin: 2 g/dL — ABNORMAL LOW (ref 3.5–5.0)
Alkaline Phosphatase: 161 U/L — ABNORMAL HIGH (ref 38–126)
Anion gap: 10 (ref 5–15)
BUN: 20 mg/dL (ref 6–20)
CO2: 22 mmol/L (ref 22–32)
Calcium: 8.8 mg/dL — ABNORMAL LOW (ref 8.9–10.3)
Chloride: 104 mmol/L (ref 98–111)
Creatinine, Ser: 1.6 mg/dL — ABNORMAL HIGH (ref 0.61–1.24)
GFR, Estimated: 51 mL/min — ABNORMAL LOW (ref >60)
Glucose, Bld: 110 mg/dL — ABNORMAL HIGH (ref 70–99)
Potassium: 4.1 mmol/L (ref 3.5–5.1)
Sodium: 136 mmol/L (ref 135–145)
Total Bilirubin: 0.5 mg/dL (ref 0.3–1.2)
Total Protein: 6 g/dL — ABNORMAL LOW (ref 6.5–8.1)

## 2020-09-02 MED ORDER — TIZANIDINE HCL 2 MG PO TABS
2.0000 mg | ORAL_TABLET | Freq: Three times a day (TID) | ORAL | Status: DC | PRN
  Administered 2020-09-02: 2 mg via ORAL
  Filled 2020-09-02 (×2): qty 1

## 2020-09-02 MED ORDER — INDOMETHACIN 25 MG SUPPOSITORY
25.0000 mg | Freq: Two times a day (BID) | RECTAL | Status: DC
  Filled 2020-09-02: qty 1

## 2020-09-02 MED ORDER — INDOMETHACIN 50 MG RE SUPP
25.0000 mg | Freq: Two times a day (BID) | RECTAL | Status: DC
  Filled 2020-09-02 (×3): qty 1

## 2020-09-02 MED ORDER — NYSTATIN 100000 UNIT/GM EX POWD
Freq: Two times a day (BID) | CUTANEOUS | Status: DC
  Filled 2020-09-02: qty 15

## 2020-09-02 NOTE — Discharge Instructions (Addendum)
Inpatient Rehab Discharge Instructions  Patrick [name] Discharge date and time: No discharge date for patient encounter.   Activities/Precautions/ Functional Status: Activity: activity as tolerated Diet: diabetic diet Wound Care: Routine skin checks Functional status:  ___ No restrictions     ___ Walk up steps independently ___ 24/7 supervision/assistance   ___ Walk up steps with assistance ___ Intermittent supervision/assistance  ___ Bathe/dress independently ___ Walk with walker     __x_ Bathe/dress with assistance ___ Walk Independently    ___ Shower independently ___ Walk with assistance    ___ Shower with assistance ___ No alcohol     ___ Return to work/school ________  Special Instructions:  No driving smoking or alcohol    COMMUNITY REFERRALS UPON DISCHARGE:     HOME EXERCISE PROGRAM CAN NOT GET FOLLOW UP DUE TO UNINSURED  Medical Equipment/Items Ordered: WHEELCHAIR                                                 Agency/Supplier: [facility]  [name]  My questions have been answered and I understand these instructions. I will adhere to these goals and the provided educational materials after my discharge from the hospital.  Patient/Caregiver Signature _______________________________ Date __________  Clinician Signature _______________________________________ Date __________  Please bring this form and your medication list with you to all your follow-up doctor's appointments.

## 2020-09-02 NOTE — Progress Notes (Signed)
Occupational Therapy Session Note  Patient Details  Name: Patrick [name] MRN: [id number] Date of Birth: Nov 14, 1965  Today's Date: 09/02/2020 OT Individual Time: 1040-1120 OT Individual Time Calculation (min): 40 min    Short Term Goals: Week 1:  OT Short Term Goal 1 (Week 1): Pt will complete LB dressing at sit > stand level with CGA OT Short Term Goal 2 (Week 1): Pt will complete toilet transfer with supervision OT Short Term Goal 3 (Week 1): Pt will complete bathing at sit > stand level with CGA  Skilled Therapeutic Interventions/Progress Updates:     Pt received in w/c with 3-6 out of 10 pain in residual limb. Repositioning in bed at end of session  provided for pain relief  Therapeutic activity Pt with inappropriate BSC, therefore OT brought bariatric BSC to room with DA feature and educated pt on use and potential recommendations for home. Pt and OT also discuss DC set up and may need TTB as well for tub shower with curtain. CSW present for conversation. OT demo equipment in room. Pt verbalized understanding. [employer] rep present for examining shrinker sock for residual limb. Educated pt on elements in fabric that promote healing and sock should be in contact with skin. Pt verbalized understanding as well as need to periodically check to see that sock had not slid down to cut off circulation to BKA. Pt also informed of ficure 8 wrapping technique when shrinker is being laundered. Pt returned to bed with CGA lateral scoot transfer with increased time to educate pt on w/c parts management/set up.  Pt left at end of session in bed with exit alarm on, call light in reach and all needs met   Therapy Documentation Precautions:  Restrictions Weight Bearing Restrictions: Yes RLE Weight Bearing: Non weight bearing General:   Vital Signs: Therapy Vitals Temp: 99 F (37.2 C) Temp Source: Oral Pulse Rate: 83 Resp: 18 BP: 126/78 Patient Position (if appropriate): Lying Oxygen  Therapy SpO2: 98 % O2 Device: Room Air Pain: Pain Assessment Pain Score: Asleep ADL:   Vision   Perception    Praxis   Exercises:   Other Treatments:     Therapy/Group: Individual Therapy  Tonny Branch 09/02/2020, 6:50 AM

## 2020-09-02 NOTE — Plan of Care (Signed)
  Problem: Consults Goal: RH LIMB LOSS PATIENT EDUCATION Description: Description: See Patient Education module for eduction specifics. Outcome: Progressing Goal: Skin Care Protocol Initiated - if Braden Score 18 or less Description: If consults are not indicated, leave blank or document N/A Outcome: Progressing Goal: Diabetes Guidelines if Diabetic/Glucose > 140 Description: If diabetic or lab glucose is > 140 mg/dl - Initiate Diabetes/Hyperglycemia Guidelines & Document Interventions  Outcome: Progressing   Problem: RH SKIN INTEGRITY Goal: RH STG MAINTAIN SKIN INTEGRITY WITH ASSISTANCE Description: STG Maintain Skin Integrity With mod I Assistance. Outcome: Progressing   Problem: RH SAFETY Goal: RH STG ADHERE TO SAFETY PRECAUTIONS W/ASSISTANCE/DEVICE Description: STG Adhere to Safety Precautions With mod I Assistance at [place] level. Outcome: Progressing   Problem: RH PAIN MANAGEMENT Goal: RH STG PAIN MANAGED AT OR BELOW PT'S PAIN GOAL Description: < 4 on 0-10 pain scale. Outcome: Progressing   Problem: RH KNOWLEDGE DEFICIT LIMB LOSS Goal: RH STG INCREASE KNOWLEDGE OF SELF CARE AFTER LIMB LOSS Description: Patient will be able to demonstrate knowledge of medication management, pain management, skin/wound care, residual limb care, and weight bearing precautions with educational materials and handouts provided by staff independently at discharge. Outcome: Progressing

## 2020-09-02 NOTE — Evaluation (Signed)
Occupational Therapy Assessment and Plan  Patient Details  Name: Patrick [name] MRN: [id number] Date of Birth: 1965-07-28  OT Diagnosis: acute pain, muscle weakness (generalized), and swelling of limb Rehab Potential: Rehab Potential (ACUTE ONLY): Good ELOS: 10-14 days   Today's Date: 09/02/2020 OT Individual Time: 2263-3354 OT Individual Time Calculation (min): 62 min     Hospital Problem: Principal Problem:   Right below-knee amputee ([facility]) Active Problems:   GOUT   CKD (chronic kidney disease) stage 3, GFR 30-59 ml/min (HCC)   ABLA (acute blood loss anemia)   Past Medical History:  Past Medical History:  Diagnosis Date   Diabetes mellitus    Gout    History of COVID-19 04/2019   Hypertension    Past Surgical History:  Past Surgical History:  Procedure Laterality Date   AMPUTATION Right 06/06/2020   Procedure: AMPUTATION 5TH RAY;  Surgeon: [name], MD;  Location: [place];  Service: Orthopedics;  Laterality: Right;   AMPUTATION Right 06/08/2020   Procedure: RIGHT 4TH RAY AMPUTATION;  Surgeon: [name], MD;  Location: [place];  Service: Orthopedics;  Laterality: Right;   AMPUTATION Right 08/27/2020   Procedure: AMPUTATION BELOW KNEE;  Surgeon: Jessy Oto, MD;  Location: [place];  Service: Orthopedics;  Laterality: Right;    Assessment & Plan Clinical Impression: Patient is a 55 y.o. right-handed male with history of diabetes mellitus, gout, chronic anemia with hemoglobin 8-9, obesity with BMI of 38.27, CKD stage III, hypertension, PVD with right fourth and fifth ray amputations 05/2020, recent COVID 04/2019 as well as a recent C. difficile 06/2020 treated with p.o. vancomycin.    Per chart review independent with assistive device prior to admission living with non relatives.  Level entry home with bed and bath upstairs.  Presented 08/26/2020 with low-grade fever/tachycardia as well as nonhealing right foot ulcer followed at the [facility] wound care clinic and has undergone  multiple debridements.  Admission chemistry sodium 134, BUN 32, creatinine 2.27, WBC 14,000, hemoglobin 6.0, lactic acid 1.3.  Blood cultures obtained 1/4 grew staph, urine culture 1000 colony of Enterococcus.  Prior urine culture from admission with VRE and started on Zyvox.  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 Louanne Skye.  His Hemovac was discontinued 08/29/2020.  Initially maintained on broad-spectrum antibiotics simplified to amoxicillin completing course as directed.  Hospital course acute blood loss anemia 6.5 and has received a total of 4 units packed red blood cells with latest hemoglobin 7.5 as well as patient did have one positive Hemoccult stool and monitored.   Patient developed right elbow gout pain with uric acid 11.2 and placed on colchicine as well as Zyloprim.  He was cleared to begin Lovenox for DVT prophylaxis 08/28/2020.  In regards to patient's recent C. difficile cleared by infectious disease that he no longer needs contact precautions and would complete course of p.o. vancomycin.  Therapy evaluations completed due to patient's decreased functional mobility was admitted for a comprehensive rehab program.  Patient transferred to CIR on 09/01/2020 .    Patient currently requires mod with basic self-care skills secondary to muscle weakness, decreased cardiorespiratoy endurance, and decreased standing balance, decreased postural control, decreased balance strategies, and pain .  Prior to hospitalization, patient could complete ADLs with modified independent .  Patient will benefit from skilled intervention to decrease level of assist with basic self-care skills and increase independence with basic self-care skills prior to discharge  home with 24/7 supervision from friends .  Anticipate patient will require intermittent supervision and follow up home health.  OT - End of Session Activity Tolerance: Tolerates 30+ min activity  with multiple rests Endurance Deficit: Yes Endurance Deficit Description: require frequent rest breaks due to groin pain OT Assessment Rehab Potential (ACUTE ONLY): Good OT Barriers to Discharge: Home environment access/layout OT Barriers to Discharge Comments: 2 steps to enter OT Patient demonstrates impairments in the following area(s): Balance;Endurance;Motor;Pain;Safety;Skin Integrity OT Basic ADL's Functional Problem(s): Grooming;Bathing;Dressing;Toileting OT Advanced ADL's Functional Problem(s): Simple Meal Preparation OT Transfers Functional Problem(s): Toilet;Tub/Shower OT Additional Impairment(s): None OT Plan OT Intensity: Minimum of 1-2 x/day, 45 to 90 minutes OT Frequency: 5 out of 7 days OT Duration/Estimated Length of Stay: 10-14 days OT Treatment/Interventions: Medical illustrator training;Community reintegration;Discharge planning;Disease mangement/prevention;DME/adaptive equipment instruction;Functional mobility training;Pain management;Patient/family education;Psychosocial support;Self Care/advanced ADL retraining;Skin care/wound managment;Splinting/orthotics;Therapeutic Activities;Therapeutic Exercise;UE/LE Strength taining/ROM;Wheelchair propulsion/positioning OT Basic Self-Care Anticipated Outcome(s): Supervision - Mod I OT Toileting Anticipated Outcome(s): Supervision OT Bathroom Transfers Anticipated Outcome(s): Supervision OT Recommendation Recommendations for Other Services: Therapeutic Recreation consult Therapeutic Recreation Interventions: Outing/community reintergration Patient destination: Home Follow Up Recommendations: Home health OT Equipment Recommended: Tub/shower bench;3 in 1 bedside comode Equipment Details: bariatric drop arm BSC   OT Evaluation Precautions/Restrictions  Precautions Precautions: Fall Restrictions Weight Bearing Restrictions: Yes RLE Weight Bearing: Non weight bearing General   Vital Signs Therapy Vitals Temp: 99 F (37.2  C) Temp Source: Oral Pulse Rate: 83 Resp: 18 BP: 126/78 Patient Position (if appropriate): Lying Oxygen Therapy SpO2: 98 % O2 Device: Room Air Pain Pain Assessment Pain Scale: 0-10 Pain Score: 8  Pain Type: Acute pain;Surgical pain Pain Location: Groin Pain Descriptors / Indicators: Aching;Grimacing;Guarding Pain Intervention(s): RN made aware;Repositioned Home Living/Prior Functioning Home Living Family/patient expects to be discharged to:: Private residence Living Arrangements: Non-relatives/Friends Available Help at Discharge: Friend(s), Available 24 hours/day (reports plans to move to a different home with different friends soon) Type of Home: House Home Access: Stairs to enter Technical brewer of Steps: 2 Entrance Stairs-Rails: Can reach both Home Layout: Able to live on main level with bedroom/bathroom, Two level Bathroom Shower/Tub: Chiropodist: Standard IADL History Homemaking Responsibilities: No Current License: Yes Occupation: Full time employment Prior Function Level of Independence: Independent with basic ADLs, Requires assistive device for independence (used crutches and scooter) Driving: Yes Vocation: Full time employment Biomedical scientist: working in Nurse, learning disability, 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 Vision Baseline Vision/History: Wears glasses Wears Glasses: Reading only Patient Visual Report: No change from baseline Vision Assessment?: No apparent visual deficits Perception  Perception: Within Functional Limits Praxis Praxis: Intact Cognition Overall Cognitive Status: Within Functional Limits for tasks assessed Arousal/Alertness: Awake/alert Orientation Level: Person;Place;Situation Person: Oriented Place: Oriented Situation: Oriented Year: 2022 Month: July Day of Week: Correct Memory: Appears intact Immediate Memory Recall: Sock;Blue;Bed Memory Recall Sock:  Without Cue Memory Recall Blue: Without Cue Memory Recall Bed: Without Cue Attention: Selective Selective Attention: Appears intact Awareness: Appears intact Problem Solving: Appears intact Safety/Judgment: Appears intact Sensation Sensation Light Touch: Impaired by gross assessment Proprioception: Impaired by gross assessment Additional Comments: absent sensation along incision of R residual limb. decreased sensation along L medial/lateral ankle and great toe Coordination Gross Motor Movements are Fluid and Coordinated: No Fine Motor Movements are Fluid and Coordinated: Yes Finger Nose Finger Test: [facility] bilaterally Heel Shin Test: decreased ROM bilaterally; limited by pain in R groin Motor  Motor Motor: Abnormal postural alignment and  control Motor - Skilled Clinical Observations: grossly uncoordinated due to generalized weakness and deconditioning, decreased balance/postural control, and pain  Trunk/Postural Assessment  Cervical Assessment Cervical Assessment: Within Functional Limits Thoracic Assessment Thoracic Assessment: Exceptions to [facility] (kyphosis) Lumbar Assessment Lumbar Assessment: Exceptions to [facility] (posterior pelvic tilt) Postural Control Postural Control: Deficits on evaluation  Balance Balance Balance Assessed: Yes Static Sitting Balance Static Sitting - Balance Support: Feet supported;Bilateral upper extremity supported Static Sitting - Level of Assistance: 5: Stand by assistance (supervision) Dynamic Sitting Balance Dynamic Sitting - Balance Support: Feet supported;Bilateral upper extremity supported Dynamic Sitting - Level of Assistance: 5: Stand by assistance (supervision) Static Standing Balance Static Standing - Balance Support: Bilateral upper extremity supported (RW) Static Standing - Level of Assistance: 4: Min assist Extremity/Trunk Assessment RUE Assessment RUE Assessment: Within Functional Limits LUE Assessment LUE Assessment: Within Functional  Limits  Care Tool Care Tool Self Care Eating        Oral Care         Bathing   Body parts bathed by patient: Right arm;Left arm;Chest;Abdomen;Front perineal area;Right upper leg;Left upper leg;Face Body parts bathed by helper: Buttocks;Left lower leg Body parts n/a: Right lower leg Assist Level: Minimal Assistance - Patient > 75%    Upper Body Dressing(including orthotics)   What is the patient wearing?: Pull over shirt   Assist Level: Set up assist    Lower Body Dressing (excluding footwear)   What is the patient wearing?: Ace wrap/stump shrinker;Pants Assist for lower body dressing: Minimal Assistance - Patient > 75%    Putting on/Taking off footwear   What is the patient wearing?: Ted hose;Non-skid slipper socks Assist for footwear: Minimal Assistance - Patient > 75%       Care Tool Toileting Toileting activity         Care Tool Bed Mobility Roll left and right activity   Roll left and right assist level: Contact Guard/Touching assist    Sit to lying activity   Sit to lying assist level: Contact Guard/Touching assist    Lying to sitting edge of bed activity   Lying to sitting edge of bed assist level: Contact Guard/Touching assist     Care Tool Transfers Sit to stand transfer Sit to stand activity did not occur: Safety/medical concerns      Chair/bed transfer Chair/bed transfer activity did not occur: Safety/medical concerns       Toilet transfer Toilet transfer activity did not occur: Safety/medical concerns       Care Tool Cognition Expression of Ideas and Wants Expression of Ideas and Wants: Without difficulty (complex and basic) - expresses complex messages without difficulty and with speech that is clear and easy to understand   Understanding Verbal and Non-Verbal Content Understanding Verbal and Non-Verbal Content: Understands (complex and basic) - clear comprehension without cues or repetitions   Memory/Recall Ability *first 3 days only  Memory/Recall Ability *first 3 days only: Current season;That he or she is in a hospital/hospital unit;Staff names and faces    Refer to Care Plan for Long Term Goals  SHORT TERM GOAL WEEK 1 OT Short Term Goal 1 (Week 1): Pt will complete LB dressing at sit > stand level with CGA OT Short Term Goal 2 (Week 1): Pt will complete toilet transfer with supervision OT Short Term Goal 3 (Week 1): Pt will complete bathing at sit > stand level with CGA  Recommendations for other services: Therapeutic Recreation  Outing/community reintegration and Other leisure pursuits    Skilled Therapeutic  Intervention OT eval completed with discussion of rehab process, OT purpose, POC, ELOS, and goals.  ADL assessment completed at EOB due to reports of pain in groin and R hip, declining any OOB activity. Pt able to complete lateral leans from EOB to complete hygiene and LB dressing with CGA.  MD arrived during session, therefore shrinker removed for assessment. Pt required assistance to don shrinker.  Educated on care for residual limb and typical time frame for healing and prosthetic fit/training.  Pt returned to supine with CGA and remained semi-reclined in bed with all needs in reach.   ADL ADL Upper Body Bathing: Setup;Supervision/safety Where Assessed-Upper Body Bathing: Edge of bed Lower Body Bathing: Minimal assistance Where Assessed-Lower Body Bathing: Edge of bed Upper Body Dressing: Setup Where Assessed-Upper Body Dressing: Edge of bed Lower Body Dressing: Minimal assistance Where Assessed-Lower Body Dressing: Edge of bed Mobility  Bed Mobility Bed Mobility: Rolling Right;Right Sidelying to Sit Rolling Right: Supervision/verbal cueing Right Sidelying to Sit: Supervision/Verbal cueing Transfers Sit to Stand: Moderate Assistance - Patient 50-74% Stand to Sit: Minimal Assistance - Patient > 75%   Discharge Criteria: Patient will be discharged from OT if patient refuses treatment 3 consecutive  times without medical reason, if treatment goals not met, if there is a change in medical status, if patient makes no progress towards goals or if patient is discharged from hospital.  The above assessment, treatment plan, treatment alternatives and goals were discussed and mutually agreed upon: by patient  Ellwood Dense [facility] 09/02/2020, 10:04 AM

## 2020-09-02 NOTE — Evaluation (Signed)
Physical Therapy Assessment and Plan  Patient Details  Name: Patrick [name] MRN: [id number] Date of Birth: 1965-10-19  PT Diagnosis: Abnormal posture, Abnormality of gait, Coordination disorder, Difficulty walking, Edema, Impaired sensation, Muscle weakness, and Pain in R groin Rehab Potential: Good ELOS: 12-14 days   Today's Date: 09/02/2020 PT Individual Time: 1025-8527 PT Individual Time Calculation (min): 56 min    Hospital Problem: Principal Problem:   Right below-knee amputee ([place]) Active Problems:   GOUT   CKD (chronic kidney disease) stage 3, GFR 30-59 ml/min (HCC)   ABLA (acute blood loss anemia)   Past Medical History:  Past Medical History:  Diagnosis Date   Diabetes mellitus    Gout    History of COVID-19 04/2019   Hypertension    Past Surgical History:  Past Surgical History:  Procedure Laterality Date   AMPUTATION Right 06/06/2020   Procedure: AMPUTATION 5TH RAY;  Surgeon: [name], MD;  Location: [facility];  Service: Orthopedics;  Laterality: Right;   AMPUTATION Right 06/08/2020   Procedure: RIGHT 4TH RAY AMPUTATION;  Surgeon: [name], MD;  Location: [facility];  Service: Orthopedics;  Laterality: Right;   AMPUTATION Right 08/27/2020   Procedure: AMPUTATION BELOW KNEE;  Surgeon: Jessy Oto, MD;  Location: [facility];  Service: Orthopedics;  Laterality: Right;    Assessment & Plan Clinical Impression: Patient is a 55 y.o. year old male with history of diabetes mellitus, gout, chronic anemia with hemoglobin 8-9, obesity with BMI of 38.27, CKD stage III, hypertension, PVD with right fourth and fifth ray amputations 05/2020, recent COVID 04/2019 as well as a recent C. difficile 06/2020 treated with p.o. vancomycin.    Per chart review independent with assistive device prior to admission living with non relatives.  Level entry home with bed and bath upstairs.  Presented 08/26/2020 with low-grade fever/tachycardia as well as nonhealing right foot ulcer followed at the [facility] wound care clinic and has undergone multiple debridements.  Admission chemistry sodium 134, BUN 32, creatinine 2.27, WBC 14,000, hemoglobin 6.0, lactic acid 1.3.  Blood cultures obtained 1/4 grew staph, urine culture 1000 colony of Enterococcus.  Prior urine culture from admission with VRE and started on Zyvox.  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 Louanne Skye.  His Hemovac was discontinued 08/29/2020.  Initially maintained on broad-spectrum antibiotics simplified to amoxicillin completing course as directed.  Hospital course acute blood loss anemia 6.5 and has received a total of 4 units packed red blood cells with latest hemoglobin 7.5 as well as patient did have one positive Hemoccult stool and monitored.   Patient developed right elbow gout pain with uric acid 11.2 and placed on colchicine as well as Zyloprim.  He was cleared to begin Lovenox for DVT prophylaxis 08/28/2020.  In regards to patient's recent C. difficile cleared by infectious disease that he no longer needs contact precautions and would complete course of p.o. vancomycin.  Therapy evaluations completed due to patient's decreased functional mobility was admitted for a comprehensive rehab program.  Patient currently requires min with mobility secondary to muscle weakness, decreased cardiorespiratoy endurance, and decreased standing balance, decreased postural control, decreased balance strategies, and difficulty maintaining precautions.  Prior to hospitalization, patient was modified independent  with mobility and lived with Friend(s) in a House home.  Home access is 2Level entry.  Patient will benefit from skilled PT intervention to maximize safe functional mobility, minimize fall risk, and decrease  caregiver burden for planned discharge home with intermittent assist.  Anticipate patient will benefit from follow up [place] at discharge.  PT - End of Session Activity Tolerance:  Tolerates 30+ min activity with multiple rests Endurance Deficit: Yes Endurance Deficit Description: required  frequent rest breaks due to groin pain PT Assessment Rehab Potential (ACUTE/IP ONLY): Good PT Barriers to Discharge: [place] home environment;Decreased caregiver support;Home environment access/layout;Wound Care;Weight bearing restrictions PT Barriers to Discharge Comments: unsure of number of stairs if any to enter home, intermittent assist available, and pain PT Patient demonstrates impairments in the following area(s): Balance;Edema;Endurance;Motor;Nutrition;Pain;Perception;Safety;Sensory;Skin Integrity PT Transfers Functional Problem(s): Bed Mobility;Bed to Chair;Car;Furniture PT Locomotion Functional Problem(s): Ambulation;Wheelchair Mobility;Stairs PT Plan PT Intensity: Minimum of 1-2 x/day ,45 to 90 minutes PT Frequency: 5 out of 7 days PT Duration Estimated Length of Stay: 12-14 days PT Treatment/Interventions: Ambulation/gait training;Discharge planning;Functional mobility training;Psychosocial support;Therapeutic Activities;Balance/vestibular training;Disease management/prevention;Neuromuscular re-education;Wheelchair propulsion/positioning;Therapeutic Exercise;Skin care/wound management;DME/adaptive equipment instruction;Pain management;Splinting/orthotics;UE/LE Strength taining/ROM;Community reintegration;Functional electrical stimulation;Patient/family education;Stair training;UE/LE Coordination activities PT Transfers Anticipated Outcome(s): Mod I PT Locomotion Anticipated Outcome(s): supervision with LRAD PT Recommendation Follow Up Recommendations: Home health PT Patient destination: Home Equipment Recommended: To be determined Equipment Details: has crutches  PT Evaluation Precautions/Restrictions Precautions Precautions: Fall Restrictions Weight Bearing Restrictions: Yes RLE Weight Bearing: Non weight bearing Home Living/Prior Functioning Home  Living Living Arrangements: Non-relatives/Friends Available Help at Discharge: Friend(s);Available PRN/intermittently (reports plans to move to a different home with different friends soon) Type of Home: House Home Access: Level entry Home Layout: Able to live on main level with bedroom/bathroom;Two level Alternate Level Stairs-Number of Steps: flight Alternate Level Stairs-Rails: Right;Left;Can reach both Bathroom Shower/Tub: Chiropodist: Standard Bathroom Accessibility: No  Lives With: Friend(s) Prior Function Level of Independence: Requires assistive device for independence (used crutches for 2 months prior to amputation)  Able to Take Stairs?: Yes Driving: Yes Vocation: Full time employment Biomedical scientist: working in Nurse, learning disability, 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 decreased sensation along L medial/lateral ankle and great toe Coordination Gross Motor Movements are Fluid and Coordinated: No Fine Motor Movements are Fluid and Coordinated: Yes Coordination and Movement Description: grossly uncoordinated due to generalized weakness and deconditioning, decreased balance/postural control, and pain Finger Nose Finger Test: [facility] bilaterally Heel Shin Test: decreased ROM bilaterally; limited by pain in R groin Motor  Motor Motor: Abnormal postural alignment and control Motor - Skilled Clinical Observations: grossly uncoordinated due to generalized weakness and deconditioning,  decreased balance/postural control, and pain  Trunk/Postural Assessment  Cervical Assessment Cervical Assessment: Within Functional Limits Thoracic Assessment Thoracic Assessment: Exceptions to WFL (kyphosis) Lumbar Assessment Lumbar Assessment: Exceptions to [facility] (posterior pelvic tilt) Postural Control Postural Control: Deficits on evaluation  Balance Balance Balance Assessed: Yes Static Sitting Balance Static Sitting - Balance Support: Feet supported;Bilateral upper extremity supported Static Sitting - Level of Assistance: 5: Stand by assistance (supervision) Dynamic Sitting Balance Dynamic Sitting - Balance Support: Feet supported;Bilateral upper extremity supported Dynamic Sitting - Level of Assistance: 5: Stand by assistance (supervision) Static Standing Balance Static Standing - Balance Support: Bilateral upper extremity supported (RW) Static Standing - Level of Assistance: 4: Min assist Extremity Assessment  RLE  Assessment RLE Assessment: Exceptions to [facility] General Strength Comments: grossly generalized to 4-/5 in available planes but limited by pain LLE Assessment LLE Assessment: Exceptions to [facility] General Strength Comments: grossly generalized to 4-/5  Care Tool Care Tool Bed Mobility Roll left and right activity   Roll left and right assist level: Supervision/Verbal cueing    Sit to lying activity   Sit to lying assist level: Contact Guard/Touching assist    Lying to sitting edge of bed activity   Lying to sitting edge of bed assist level: Supervision/Verbal cueing     Care Tool Transfers Sit to stand transfer Sit to stand activity did not occur: Safety/medical concerns Sit to stand assist level: Moderate Assistance - Patient 50 - 74% (from elevated bed)    Chair/bed transfer Chair/bed transfer activity did not occur: Safety/medical concerns Chair/bed transfer assist level: Minimal Assistance - Patient > 75% (lateral scoot)     Toilet transfer Toilet transfer  activity did not occur: Safety/medical concerns      Scientist, product/process development transfer activity did not occur: Environmental limitations        Care Tool Locomotion Ambulation Ambulation activity did not occur: Safety/medical concerns (weakness, fatigue, decreasd balance/postural control)        Walk 10 feet activity Walk 10 feet activity did not occur: Safety/medical concerns (weakness, fatigue, decreasd balance/postural control)       Walk 50 feet with 2 turns activity Walk 50 feet with 2 turns activity did not occur: Safety/medical concerns (weakness, fatigue, decreasd balance/postural control)      Walk 150 feet activity Walk 150 feet activity did not occur: Safety/medical concerns (weakness, fatigue, decreasd balance/postural control)      Walk 10 feet on uneven surfaces activity Walk 10 feet on uneven surfaces activity did not occur: Safety/medical concerns (weakness, fatigue, decreasd balance/postural control)      Stairs Stair activity did not occur: Safety/medical concerns (weakness, fatigue, decreasd balance/postural control)        Walk up/down 1 step activity Walk up/down 1 step or curb (drop down) activity did not occur: Safety/medical concerns (weakness, fatigue, decreasd balance/postural control)     Walk up/down 4 steps activity did not occuR: Safety/medical concerns (weakness, fatigue, decreasd balance/postural control)  Walk up/down 4 steps activity      Walk up/down 12 steps activity Walk up/down 12 steps activity did not occur: Safety/medical concerns (weakness, fatigue, decreasd balance/postural control)      Pick up small objects from floor Pick up small object from the floor (from standing position) activity did not occur: Safety/medical concerns (decreasd balance/postural control)      Wheelchair Will patient use wheelchair at discharge?: Yes Type of Wheelchair: Manual   Wheelchair assist level: Supervision/Verbal cueing Max wheelchair distance: 140f   Wheel 50 feet with 2 turns activity   Assist Level: Supervision/Verbal cueing  Wheel 150 feet activity   Assist Level: Supervision/Verbal cueing    Refer to Care Plan for Long Term Goals  SHORT TERM GOAL WEEK 1 PT Short Term Goal 1 (Week 1): pt will transfer sit<>stand with LRAD and CGA PT Short Term Goal 2 (Week 1): pt will transfer bed<>chair with LRAD and CGA PT Short Term Goal 3 (Week 1): pt will initate gait training  Recommendations for other services: None   Skilled Therapeutic Intervention Evaluation completed (see details above and below) with education on PT POC and goals and individual treatment initiated with focus on functional mobility/transfers, generalized strengthening, dynamic standing balance/coordination, and improved activity tolerance.  Received pt supine in bed, pt educated on PT evaluation, CIR policies, and therapy schedule and agreeable. Pt reported pain 8/10 in R groin. RN present to adminster pain medications and repositioning, rest breaks, and distraction done to reduce pain levels. Provided pt with 20x18 WC with R amputee support pad and bariatric RW. Pt transferred semi-reclined<>sitting EOB with supervision and use of bedrails and transferred sit<>stand with RW and mod A from elevated bed. Pt required cues for upright posture/gaze and hip extension. Returned to sitting and transferred bed<>WC via lateral scoot with CGA/min A and performed WC mobility 184f using BUE and [facility]. Concluded session with pt sitting in WC, needs within reach, and chair pad alarm on. Safety plan updated and NT made aware of transfer status.   Mobility Bed Mobility Bed Mobility: Rolling Right;Right Sidelying to Sit Rolling Right: Supervision/verbal cueing Right Sidelying to Sit: Supervision/Verbal cueing Transfers Transfers: Sit to Stand;Stand to Sit;Lateral/Scoot Transfers Sit to Stand: Moderate Assistance - Patient 50-74% Stand to Sit: Minimal Assistance -  Patient > 75% Lateral/Scoot Transfers: Minimal Assistance - Patient > 75% Transfer (Assistive device): Rolling walker Locomotion  Gait Ambulation: No Gait Gait: No Stairs / Additional Locomotion Stairs: No Wheelchair Mobility Wheelchair Mobility: Yes Wheelchair Assistance: SChartered loss adjuster Both upper extremities Wheelchair Parts Management: Needs assistance Distance: 1581f  Discharge Criteria: Patient will be discharged from PT if patient refuses treatment 3 consecutive times without medical reason, if treatment goals not met, if there is a change in medical status, if patient makes no progress towards goals or if patient is discharged from hospital.  The above assessment, treatment plan, treatment alternatives and goals were discussed and mutually agreed upon: by patient  AnAlfonse AlpersT, DPT  09/02/2020, 12:22 PM

## 2020-09-02 NOTE — Progress Notes (Signed)
Orthopedic Tech Progress Note Patient Details:  Patrick [name] Jul 01, 1965 [id number] BK Shrinker has been ordered from [employer]  Patient ID: Patrick [name], male   DOB: 02-01-1966, 55 y.o.   MRN: [id number]  Jearld Lesch 09/02/2020, 11:16 AM

## 2020-09-02 NOTE — Progress Notes (Signed)
PROGRESS NOTE   Subjective/Complaints: Had a fair night. Pain generally tolerable. Shrinker sock tends to roll on him and gather below knee. Anxious to get started with therapies. C/o right "groin" strain, something he did prior to admission, exacerbated with therapy exercises. Right elbow improved?  ROS: Patient denies fever, rash, sore throat, blurred vision, nausea, vomiting, diarrhea, cough, shortness of breath or chest pain,  headache, or mood change.    Objective:   DG CHEST PORT 1 VIEW  Result Date: 08/31/2020 CLINICAL DATA:  Fever EXAM: PORTABLE CHEST 1 VIEW COMPARISON:  08/26/2020 FINDINGS: The heart size and mediastinal contours are stable. No focal airspace consolidation, pleural effusion, or pneumothorax. The visualized skeletal structures are unremarkable. IMPRESSION: No active disease. Electronically Signed   By: Davina Poke D.O.   On: 08/31/2020 12:40   Recent Labs    09/01/20 0439 09/02/20 0635  WBC 12.3* 10.2  HGB 7.5* 7.7*  HCT 24.2* 24.9*  PLT 558* 559*   Recent Labs    09/01/20 0439 09/02/20 0635  NA 136 136  K 4.2 4.1  CL 107 104  CO2 22 22  GLUCOSE 118* 110*  BUN 18 20  CREATININE 1.47* 1.60*  CALCIUM 8.5* 8.8*    Intake/Output Summary (Last 24 hours) at 09/02/2020 0915 Last data filed at 09/02/2020 0700 Gross per 24 hour  Intake 120 ml  Output 700 ml  Net -580 ml        Physical Exam: Vital Signs Blood pressure 126/78, pulse 83, temperature 99 F (37.2 C), temperature source Oral, resp. rate 18, height '6\' 4"'$  (1.93 m), weight (!) 144 kg, SpO2 98 %.  General: Alert and oriented x 3, No apparent distress HEENT: Head is normocephalic, atraumatic, PERRLA, EOMI, sclera anicteric, oral mucosa pink and moist, dentition intact, ext ear canals clear,  Neck: Supple without JVD or lymphadenopathy Heart: Reg rate and rhythm. No murmurs rubs or gallops Chest: CTA bilaterally without wheezes,  rales, or rhonchi; no distress Abdomen: Soft, non-tender, non-distended, bowel sounds positive. Extremities: No clubbing, cyanosis, 2+ LLE edema.   Psych: Pt's affect is appropriate. Pt is cooperative Skin: right bka bulbous/swollen, "T" incision with staples, minimal drainage. Macerated area in abdominal/waist fold. Neuro: Pt is cognitively appropriate with normal insight, memory, and awareness. Cranial nerves 2-12 are intact. Sensory exam is normal. Reflexes are 2+ in all 4's. Fine motor coordination is intact. No tremors. Motor function is grossly 5/5 UE. RLE--can bend knee and lift leg against gravity..  Musculoskeletal: Right BK residual limb tender with palpation and ROM. Able to fully extend at the knee, flexion still limited d/t swelling. Right hip flexors sore, likely rectus femoris   Assessment/Plan: 1. Functional deficits which require 3+ hours per day of interdisciplinary therapy in a comprehensive inpatient rehab setting. Physiatrist is providing close team supervision and 24 hour management of active medical problems listed below. Physiatrist and rehab team continue to assess barriers to discharge/monitor patient progress toward functional and medical goals  Care Tool:  Bathing    Body parts bathed by patient: Right arm, Left arm, Chest, Abdomen, Front perineal area, Right upper leg, Left upper leg, Face   Body parts bathed by helper:  Buttocks, Left lower leg Body parts n/a: Right lower leg   Bathing assist Assist Level: Minimal Assistance - Patient > 75%     Upper Body Dressing/Undressing Upper body dressing   What is the patient wearing?: Pull over shirt    Upper body assist Assist Level: Set up assist    Lower Body Dressing/Undressing Lower body dressing      What is the patient wearing?: Ace wrap/stump shrinker, Pants     Lower body assist Assist for lower body dressing: Minimal Assistance - Patient > 75%     Toileting Toileting    Toileting assist  Assist for toileting: Contact Guard/Touching assist     Transfers Chair/bed transfer  Transfers assist  Chair/bed transfer activity did not occur: Safety/medical concerns  Chair/bed transfer assist level: Minimal Assistance - Patient > 75% (scoot pivot)     Locomotion Ambulation   Ambulation assist              Walk 10 feet activity   Assist           Walk 50 feet activity   Assist           Walk 150 feet activity   Assist           Walk 10 feet on uneven surface  activity   Assist           Wheelchair     Assist               Wheelchair 50 feet with 2 turns activity    Assist            Wheelchair 150 feet activity     Assist          Blood pressure 126/78, pulse 83, temperature 99 F (37.2 C), temperature source Oral, resp. rate 18, height '6\' 4"'$  (1.93 m), weight (!) 144 kg, SpO2 98 %.  Medical Problem List and Plan: 1.   Debility secondary to RIGHT BKA [id number] complicated by sepsis             -patient may shower if R BKA covered             -ELOS/Goals: 7-10 days mod I to supervision  -begin therapies  -will ask [employer] to see if we can fit with another shrinker, perhaps a longer one which can over knee. He may need something custom d/t size 2.  Antithrombotics: -DVT/anticoagulation: Lovenox             -antiplatelet therapy: N/A 3. Pain Management: Neurontin 300 mg 3 times daily, Zanaflex 4 mg nightly, oxycodone as needed 7/15- will add prn tizanidine for muscle spasm -discussed some stretches for right hip. Needs to be careful in short term to avoid overexerting with right hip flexion. This is more of a hip flexor strain then a groin strain, likely rectus femoris, possibly iliopsoas.   -prn heat 4. Mood: Provide emotional support             -antipsychotic agents: N/A 5. Neuropsych: This patient is capable of making decisions on his own behalf. 6. Skin/Wound Care: Routine skin checks 7.  Fluids/Electrolytes/Nutrition: Routine in and outs with follow-up chemistries 8.  Acute on chronic anemia.  Patient had received total 4 units packed red blood cells.  Continue iron supplement.  Follow-up CBC 9.  Diabetes mellitus with peripheral neuropathy.  Hemoglobin A1c 7.8.  Lantus insulin 25 units twice daily, NovoLog 4 units 3 times daily with meals.  CBG (last 3)  Recent Labs    09/01/20 1656 09/01/20 2046 09/02/20 0600  GLUCAP 93 160* 110*   7/15 fair to borderline control---observe for pattern 10.  Right elbow gout.  Uric acid 11.2.  Zyloprim 100 mg twice daily.  Course of colchicine has been completed.  -7/15 pain better. Pt takes indocin at home. Advised that he should be avoiding this with his kidney disease. I will give him a '25mg'$  bid x 2 days, then stop. 11.  ID/sepsis.  Blood cultures 1/4 grew staph.  Completing course of amoxicillin after initially being maintained on cefepime  7/15 wbc's down to 10.2 12.  CKD stage III.   7/15 BUN/Cr 20/1.6 13.  Recent history C. difficile 06/2020.  Completing course of p.o. vancomycin.  Contact precautions discontinued 14.VRE/UTI.Contact precautions and complete course of Zyvox 14.  Obesity.  BMI 38.27.  Dietary follow-up 15.  Tachycardia.  Lopressor 12.5 mg twice daily.  HR controlled        LOS: 1 days A FACE TO FACE EVALUATION WAS PERFORMED  Meredith Staggers 09/02/2020, 9:15 AM

## 2020-09-02 NOTE — Progress Notes (Signed)
[facility] Individual Statement of Services  Patient Name:  Patrick [name]  Date:  09/02/2020  Welcome to the [facility].  Our goal is to provide you with an individualized program based on your diagnosis and situation, designed to meet your specific needs.  With this comprehensive rehabilitation program, you will be expected to participate in at least 3 hours of rehabilitation therapies Monday-Friday, with modified therapy programming on the weekends.  Your rehabilitation program will include the following services:  Physical Therapy (PT), Occupational Therapy (OT), 24 hour per day rehabilitation nursing, Therapeutic Recreaction (TR), Neuropsychology, Care Coordinator, Rehabilitation Medicine, Nutrition Services, and Pharmacy Services  Weekly team conferences will be held on Tuesday to discuss your progress.  Your Inpatient Rehabilitation Care Coordinator will talk with you frequently to get your input and to update you on team discussions.  Team conferences with you and your family in attendance may also be held.  Expected length of stay: 10-14 days  Overall anticipated outcome: supervision with cues  Depending on your progress and recovery, your program may change. Your Inpatient Rehabilitation Care Coordinator will coordinate services and will keep you informed of any changes. Your Inpatient Rehabilitation Care Coordinator's name and contact numbers are listed  below.  The following services may also be recommended but are not provided by the [facility] will be made to provide these services after discharge if needed.  Arrangements include referral to agencies that provide these services.  Your insurance has been verified to be:  [employer] until 08/2020 Your primary doctor is:  [facility]  Pertinent  information will be shared with your doctor and your insurance company.  Inpatient Rehabilitation Care Coordinator:  Ovidio Kin, [place] or Emilia Beck  Information discussed with and copy given to patient by: Elease Hashimoto, 09/02/2020, 12:20 PM

## 2020-09-02 NOTE — Progress Notes (Signed)
Slept fairly well. PRN oxycodone given x2 for reports of pain to right stump-partially effective. No issues noted throughout the night

## 2020-09-02 NOTE — Progress Notes (Signed)
Inpatient Rehabilitation Care Coordinator Assessment and Plan Patient Details  Name: Patrick [name] MRN: [id number] Date of Birth: [birth year]  Today's Date: 09/02/2020  Hospital Problems: Principal Problem:   Right below-knee amputee [facility]) Active Problems:   GOUT   CKD (chronic kidney disease) stage 3, GFR 30-59 ml/min (HCC)   ABLA (acute blood loss anemia)  Past Medical History:  Past Medical History:  Diagnosis Date   Diabetes mellitus    Gout    History of COVID-19 04/2019   Hypertension    Past Surgical History:  Past Surgical History:  Procedure Laterality Date   AMPUTATION Right 06/06/2020   Procedure: AMPUTATION 5TH RAY;  Surgeon: [name], MD;  Location: [place];  Service: Orthopedics;  Laterality: Right;   AMPUTATION Right 06/08/2020   Procedure: RIGHT 4TH RAY AMPUTATION;  Surgeon: [name], MD;  Location: [place];  Service: Orthopedics;  Laterality: Right;   AMPUTATION Right 08/27/2020   Procedure: AMPUTATION BELOW KNEE;  Surgeon: Jessy Oto, MD;  Location: [place];  Service: Orthopedics;  Laterality: Right;   Social History:  reports that he has never smoked. He has never used smokeless tobacco. He reports that he does not drink alcohol and does not use drugs.  Family / Support Systems Marital Status: Divorced Patient Roles: Other (Comment) (friend) Other Supports: Doug Williams-friend [phone]-cell Anticipated Caregiver: Friends Ability/Limitations of Caregiver: All work and can check on him-intermittently Caregiver Availability: Intermittent Family Dynamics: Has friends who will check on him and he will be staying with. They are strong supports of him. He feels fortunate to have them. He has family out of state  Social History Preferred language: English Religion: Christian Cultural Background: No issues Education: Secretary/administrator educated Read: Yes Write: Yes Employment Status: Employed Name of Employer: Starting new job in 09/2020 Lexicographer Issues: No issues Guardian/Conservator: None-according to MD pt is capable of making his own decisions while here   Abuse/Neglect Abuse/Neglect Assessment Can Be Completed: Yes Physical Abuse: Denies Verbal Abuse: Denies Sexual Abuse: Denies Exploitation of patient/patient's resources: Denies Self-Neglect: Denies  Emotional Status Pt's affect, behavior and adjustment status: Pt is motivated to do well here and regain his independence. 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 He talks with the MD and feels he has a good understanding of the plan going forward and recovery. Premorbid pt/family roles/activities: Friend, employee, etc Anticipated changes in roles/activities/participation: resume Pt/family expectations/goals: Pt states: " I want to be able to manage myself , I have too not like I have a choice."  [employer]: Other (Comment) ([facility] was seeing PTA) Premorbid Home Care/DME Agencies: Other (Comment) (crutches) Transportation available at discharge: Self and friend Resource referrals recommended: Neuropsychology  Discharge Planning Living Arrangements: Non-relatives/Friends Support Systems: Friends/neighbors Type of Residence: Private residence Insurance Resources: Multimedia programmer (specify) ([place] until 7/30) Financial Resources: Employment Financial Screen Referred: Yes Living Expenses: Rent Money Management:  Patient Does the patient have any problems obtaining your medications?: No Home  Management: Self and friend Patient/Family Preliminary Plans: Return with friends needs to be mod/i before going home due to does not have 24/7 care. Friends work and can only proide limited assist. Pt has managed prior to admission and plans to again. Care Coordinator Barriers to Discharge: Decreased caregiver support, Insurance for SNF coverage Care Coordinator Anticipated Follow Up Needs: HH/OP  Clinical Impression Pleasant gentleman who tried to safe his leg and went to wound clinic before found could not be. He has friends who are supportive and he stays with. He is changing jobs 09/19/2020 and will have insurance until the end of the month. Team evaluating and setting goals with. Will work on discharge needs. Would benefit from seeing neuro-psych while here.  Elease Hashimoto 09/02/2020, 12:18 PM

## 2020-09-02 NOTE — Progress Notes (Signed)
Occupational Therapy Session Note  Patient Details  Name: Patrick [name] MRN: [id number] Date of Birth: [birth year]  Today's Date: 09/02/2020 OT Individual Time: 9357-0177 OT Individual Time Calculation (min): 54 min  and Today's Date: 09/02/2020 OT Missed Time: 21 Minutes Missed Time Reason: Patient fatigue;Pain   Short Term Goals: Week 1:  OT Short Term Goal 1 (Week 1): Pt will complete LB dressing at sit > stand level with CGA OT Short Term Goal 2 (Week 1): Pt will complete toilet transfer with supervision OT Short Term Goal 3 (Week 1): Pt will complete bathing at sit > stand level with CGA  Skilled Therapeutic Interventions/Progress Updates:  Pt received in room and consented to OT tx. Pt c/o 8/10 pain in R groin, reports he just got medication for it as it is "aggravated" but pleasantly refused OOB activity as pt reports he had just gotten back into bed and is currently in pain. Pt agreeable to instruction and training in BUE strengthening HEP to increase strength and activity tolerance for ADLs and functional transfers. Pt instructed in 3#db unilateral exercises including shoulder press, shoulder flexion, elbow flexion, and chest press all for 3x15 with min cuing for proper tech with good carryover. Pt instructed in level 2 orange theraband exercises including tricep extension, shoulder internal/external rotation, and chest press for 3x15. After tx, pt left semifowler with alarm on with all needs met.   Therapy Documentation Precautions:  Precautions Precautions: Fall Restrictions Weight Bearing Restrictions: Yes RLE Weight Bearing: Non weight bearing   Vital Signs: Therapy Vitals Pulse Rate: 84 BP: (!) 145/88 Pain: Pain Assessment Pain Scale: 0-10 Pain Score: 3  Pain Type: Acute pain Pain Location: Groin Pain Orientation: Right    Therapy/Group: Individual Therapy  Clytee Heinrich 09/02/2020, 12:33 PM

## 2020-09-02 NOTE — Progress Notes (Signed)
Inpatient Rehabilitation  Patient information reviewed and entered into eRehab system by Steffi Noviello M. Delores Thelen, M.A., CCC/SLP, PPS Coordinator.  Information including medical coding, functional ability and quality indicators will be reviewed and updated through discharge.    

## 2020-09-02 NOTE — Progress Notes (Signed)
Occupational Therapy Session Note  Patient Details  Name: Patrick [name] MRN: [id number] Date of Birth: 02/25/1965  Today's Date: 09/02/2020 OT Individual Time: [id number] OT Individual Time Calculation (min): 10 min    Short Term Goals: Week 1:  OT Short Term Goal 1 (Week 1): Pt will complete LB dressing at sit > stand level with CGA OT Short Term Goal 2 (Week 1): Pt will complete toilet transfer with supervision OT Short Term Goal 3 (Week 1): Pt will complete bathing at sit > stand level with CGA  Skilled Therapeutic Interventions/Progress Updates:  Treatment session with focus on self-care retraining and education on residual limb care.  Therapist provided pt with "First Step" publication and handout for care for intact foot.  Therapist educated pt on importance of caring for his intact limb with routine foot care and inspection.  Therapist educated on various pain management strategies and alternative pain management for residual limb with massage, tapping, and even mirror therapy.  Pt reports agreeable to exploring alternative methods in addition to pain meds.  Pt appreciative of education and reports plan to flip through and read "First Step" publication as pt with questions about prosthesis and returning to leisure pursuits.  Pt remained supine in bed with all needs in reach.  Therapy Documentation Precautions:  Precautions Precautions: Fall Restrictions Weight Bearing Restrictions: Yes RLE Weight Bearing: Non weight bearing General:   Vital Signs: Therapy Vitals Pulse Rate: 84 BP: (!) 145/88 Pain: Pain Assessment Pain Scale: 0-10 Pain Score: 3  Pain Type: Acute pain Pain Location: Groin Pain Orientation: Right   Therapy/Group: Individual Therapy  Simonne Come 09/02/2020, 12:11 PM

## 2020-09-02 NOTE — IPOC Note (Signed)
Overall Plan of Care [facility]) Patient Details Name: Patrick [name] MRN: [id number] DOB: 06/28/1965  Admitting Diagnosis: Right below-knee amputee [facility])  Hospital Problems: Principal Problem:   Right below-knee amputee ([place]) Active Problems:   GOUT   CKD (chronic kidney disease) stage 3, GFR 30-59 ml/min (HCC)   ABLA (acute blood loss anemia)     Functional Problem List: Nursing Behavior, Edema, Endurance, Medication Management, Pain, Safety, Skin Integrity  PT Balance, Edema, Endurance, Motor, Nutrition, Pain, Perception, Safety, Sensory, Skin Integrity  OT Balance, Endurance, Motor, Pain, Safety, Skin Integrity  SLP    TR         Basic ADL's: OT Grooming, Bathing, Dressing, Toileting     Advanced  ADL's: OT Simple Meal Preparation     Transfers: PT Bed Mobility, Bed to Chair, Car, Manufacturing systems engineer, Metallurgist: PT Ambulation, Emergency planning/management officer, Stairs     Additional Impairments: OT None  SLP        TR      Anticipated Outcomes Item Anticipated Outcome  Self Feeding    Swallowing      Basic self-care  Supervision - Mod I  Insurance underwriter Transfers Supervision  Bowel/Bladder  n/a  Transfers  Mod I  Locomotion  supervision with LRAD  Communication     Cognition     Pain  < 4  Safety/Judgment  Mod I at W/C level and no falls   Therapy Plan: PT Intensity: Minimum of 1-2 x/day ,45 to 90 minutes PT Frequency: 5 out of 7 days PT Duration Estimated Length of Stay: 12-14 days OT Intensity: Minimum of 1-2 x/day, 45 to 90 minutes OT Frequency: 5 out of 7 days OT Duration/Estimated Length of Stay: 10-14 days     Due to the current state of emergency, patients may not be receiving their 3-hours of Medicare-mandated therapy.   Team Interventions: Nursing Interventions Patient/Family Education, Disease Management/Prevention, Pain Management, Medication Management, Skin Care/Wound Management, Discharge Planning,  Psychosocial Support  PT interventions Ambulation/gait training, Discharge planning, Functional mobility training, Psychosocial support, Therapeutic Activities, Balance/vestibular training, Disease management/prevention, Neuromuscular re-education, Wheelchair propulsion/positioning, Therapeutic Exercise, Skin care/wound management, DME/adaptive equipment instruction, Pain management, Splinting/orthotics, UE/LE Strength taining/ROM, Community reintegration, Technical sales engineer stimulation, Patient/family education, IT trainer, UE/LE Coordination activities  OT Interventions Training and development officer, Academic librarian, Discharge planning, Disease mangement/prevention, Engineer, drilling, Functional mobility training, Pain management, Patient/family education, Psychosocial support, Self Care/advanced ADL retraining, Skin care/wound managment, Splinting/orthotics, Therapeutic Activities, Therapeutic Exercise, UE/LE Strength taining/ROM, Wheelchair propulsion/positioning  SLP Interventions    TR Interventions    SW/CM Interventions Discharge Planning, Psychosocial Support, Patient/Family Education   Barriers to Discharge MD  Medical stability  Nursing Decreased caregiver support, Home environment access/layout, Wound Care, Lack of/limited family support, Weight, Weight bearing restrictions, Medication compliance, Behavior Lives with friends due to medical issues. 2 level house, 1/2 bath on main level. Bed/bath upstairs. Has couch available on main level. Level entry. Friends will be available intermittently.  PT Inaccessible home environment, Decreased caregiver support, Home environment access/layout, Wound Care, Weight bearing restrictions unsure of number of stairs if any to enter home, intermittent assist available, and pain  OT Home environment access/layout 2 steps to enter  SLP      SW Decreased caregiver support, Insurance for SNF coverage     Team Discharge  Planning: Destination: PT-Home ,OT- Home , SLP-  Projected Follow-up: PT-Home health PT, OT-  Home health OT, SLP-  Projected Equipment Needs:  PT-To be determined, OT- Tub/shower bench, 3 in 1 bedside comode, SLP-  Equipment Details: PT-has crutches, OT-bariatric drop arm BSC Patient/family involved in discharge planning: PT- Patient,  OT-Patient, SLP-   MD ELOS: 10-13 days Medical Rehab Prognosis:  Excellent Assessment: The patient has been admitted for CIR therapies with the diagnosis of right bka. The team will be addressing functional mobility, strength, stamina, balance, safety, adaptive techniques and equipment, self-care, bowel and bladder mgt, patient and caregiver education, pre-prosthetic education and stump/wound care, pain mgt, ego support and community reentry.. Goals have been set at supervision to mod I with basic self-care and mobility.   Due to the current state of emergency, patients may not be receiving their 3 hours per day of Medicare-mandated therapy.    Meredith Staggers, MD, FAAPMR     See Team Conference Notes for weekly updates to the plan of care

## 2020-09-03 LAB — GLUCOSE, CAPILLARY
Glucose-Capillary: 97 mg/dL (ref 70–99)
Glucose-Capillary: 141 mg/dL — ABNORMAL HIGH (ref 70–99)
Glucose-Capillary: 115 mg/dL — ABNORMAL HIGH (ref 70–99)
Glucose-Capillary: 179 mg/dL — ABNORMAL HIGH (ref 70–99)

## 2020-09-03 MED ORDER — INDOMETHACIN 25 MG PO CAPS: 50.0000 mg | ORAL_CAPSULE | Freq: Two times a day (BID) | ORAL | Status: DC

## 2020-09-03 MED ORDER — INDOMETHACIN 25 MG PO CAPS
25.0000 mg | ORAL_CAPSULE | Freq: Two times a day (BID) | ORAL | Status: AC
  Administered 2020-09-03 – 2020-09-05 (×4): 25 mg via ORAL
  Filled 2020-09-03 (×4): qty 1

## 2020-09-03 NOTE — Progress Notes (Signed)
Occupational Therapy Session Note  Patient Details  Name: Patrick [name] MRN: [id number] Date of Birth: 04/02/1965  Today's Date: 09/03/2020 OT Individual Time: 5102-5852 OT Individual Time Calculation (min): 60 min   Today's Date: 09/03/2020 OT Individual Time: 1430-1515 OT Individual Time Calculation (min): 45 min   Short Term Goals: Week 1:  OT Short Term Goal 1 (Week 1): Pt will complete LB dressing at sit > stand level with CGA OT Short Term Goal 2 (Week 1): Pt will complete toilet transfer with supervision OT Short Term Goal 3 (Week 1): Pt will complete bathing at sit > stand level with CGA  Skilled Therapeutic Interventions/Progress Updates:    Session 1: Pt received in bed with 4 out of 10 pain in R groin. Repositioning and edu on heat use for pain relief  ADL: Pt declines bathing this session. Pt completes UB dressing with set up, LB dressing with supervision using lateral leans. Pt grooms seated at sink after CGA transfer to w/c via latera scoot with set up. Applied towel and coband wrap to residual limb pad to improve LE positioning and hamstr Therapeutic exercise Pt completes 3x30 ball toss (chest, bounce, overhead pass) in seated position with 5 # wrist weights to improve BUE coordination/strengthening required for BADLs/functional transfers. W/c propulsion with Sto   Pt left at end of session in w/c with exit alarm on, call light in reach and all needs met  Session 2: Pt received in w/c with 8 out of 10 pain in R groin. Heat and RN alerted at end of session for  provided for pain relief  Therapeutic exercise W/c propulsion to/from gym in prep for community level distances  Pt completes 3 min forward/backward on  UE ergometer on level 14 increasing to 18 resistance for BUE warm up prior to therex. Pt completes 2x10-15 dowel rod therex for BUE shoulder strengthening required for BADLs/functional transfers as follows with demo cuing and 13 # dowel rod  Shoulder flex/ext,  shoulder press, horizonal ab/adduct Circles in B directions Elbow flex/ext, chest press Wrist flex/ext   Pt left at end of session in w/c with exit alarm on, call light in reach and all needs met   Therapy Documentation Precautions:  Precautions Precautions: Fall Restrictions Weight Bearing Restrictions: Yes RLE Weight Bearing: Non weight bearing General:   Vital Signs: Therapy Vitals Temp: 98.4 F (36.9 C) Pulse Rate: 88 Resp: 17 BP: (!) 123/91 Patient Position (if appropriate): Lying Oxygen Therapy SpO2: 98 % O2 Device: Room Air Pain:   ADL: ADL Upper Body Bathing: Setup, Supervision/safety Where Assessed-Upper Body Bathing: Edge of bed Lower Body Bathing: Minimal assistance Where Assessed-Lower Body Bathing: Edge of bed Upper Body Dressing: Setup Where Assessed-Upper Body Dressing: Edge of bed Lower Body Dressing: Minimal assistance Where Assessed-Lower Body Dressing: Edge of bed Vision   Perception    Praxis   Exercises:   Other Treatments:     Therapy/Group: Individual Therapy  Tonny Branch 09/03/2020, 6:49 AM

## 2020-09-03 NOTE — Progress Notes (Signed)
PROGRESS NOTE   Subjective/Complaints:  R elbow gout better- not resolved- perseverating on using indomethacin- will give a total of 2 days 25 mg BID- Of note Cr up slightly to 1.6- so will not give more than the dose of Indomethacin- regardless of pt wishes.   .     ROS:  Pt denies SOB, abd pain, CP, N/V/C/D, and vision changes   Objective:   No results found. Recent Labs    09/01/20 0439 09/02/20 0635  WBC 12.3* 10.2  HGB 7.5* 7.7*  HCT 24.2* 24.9*  PLT 558* 559*   Recent Labs    09/01/20 0439 09/02/20 0635  NA 136 136  K 4.2 4.1  CL 107 104  CO2 22 22  GLUCOSE 118* 110*  BUN 18 20  CREATININE 1.47* 1.60*  CALCIUM 8.5* 8.8*    Intake/Output Summary (Last 24 hours) at 09/03/2020 1038 Last data filed at 09/03/2020 0957 Gross per 24 hour  Intake 320 ml  Output 1400 ml  Net -1080 ml        Physical Exam: Vital Signs Blood pressure (!) 123/91, pulse 88, temperature 98.4 F (36.9 C), resp. rate 17, height '6\' 4"'$  (1.93 m), weight (!) 144 kg, SpO2 98 %.    General: awake, alert, appropriate, sitting up in w/c- in room; BKA not on leg rest- PT getting it; NAD HENT: conjugate gaze; oropharynx moist CV: regular rate; no JVD Pulmonary: CTA B/L; no W/R/R- good air movement GI: soft, NT, ND, (+)BS Psychiatric: appropriate- perseverating on NSAIDs Neurological: alert Psych: Pt's affect is appropriate. Pt is cooperative Skin: right bka bulbous/swollen, "T" incision with staples, minimal drainage. Looks the same with Shrinker in place this AM  Macerated area in abdominal/waist fold. Neuro: Pt is cognitively appropriate with normal insight, memory, and awareness. Cranial nerves 2-12 are intact. Sensory exam is normal. Reflexes are 2+ in all 4's. Fine motor coordination is intact. No tremors. Motor function is grossly 5/5 UE. RLE--can bend knee and lift leg against gravity..  Musculoskeletal: Right BK residual  limb tender with palpation and ROM. Able to fully extend at the knee, flexion still limited d/t swelling. Right hip flexors sore, likely rectus femoris   Assessment/Plan: 1. Functional deficits which require 3+ hours per day of interdisciplinary therapy in a comprehensive inpatient rehab setting. Physiatrist is providing close team supervision and 24 hour management of active medical problems listed below. Physiatrist and rehab team continue to assess barriers to discharge/monitor patient progress toward functional and medical goals  Care Tool:  Bathing    Body parts bathed by patient: Right arm, Left arm, Chest, Abdomen, Front perineal area, Right upper leg, Left upper leg, Face   Body parts bathed by helper: Buttocks, Left lower leg Body parts n/a: Right lower leg   Bathing assist Assist Level: Minimal Assistance - Patient > 75%     Upper Body Dressing/Undressing Upper body dressing   What is the patient wearing?: Pull over shirt    Upper body assist Assist Level: Set up assist    Lower Body Dressing/Undressing Lower body dressing      What is the patient wearing?: Pants     Lower body assist  Assist for lower body dressing: Minimal Assistance - Patient > 75%     Toileting Toileting    Toileting assist Assist for toileting: Contact Guard/Touching assist     Transfers Chair/bed transfer  Transfers assist  Chair/bed transfer activity did not occur: Safety/medical concerns  Chair/bed transfer assist level: Minimal Assistance - Patient > 75% (lateral scoot)     Locomotion Ambulation   Ambulation assist   Ambulation activity did not occur: Safety/medical concerns (weakness, fatigue, decreasd balance/postural control)          Walk 10 feet activity   Assist  Walk 10 feet activity did not occur: Safety/medical concerns (weakness, fatigue, decreasd balance/postural control)        Walk 50 feet activity   Assist Walk 50 feet with 2 turns activity did  not occur: Safety/medical concerns (weakness, fatigue, decreasd balance/postural control)         Walk 150 feet activity   Assist Walk 150 feet activity did not occur: Safety/medical concerns (weakness, fatigue, decreasd balance/postural control)         Walk 10 feet on uneven surface  activity   Assist Walk 10 feet on uneven surfaces activity did not occur: Safety/medical concerns (weakness, fatigue, decreasd balance/postural control)         Wheelchair     Assist Will patient use wheelchair at discharge?: Yes Type of Wheelchair: Manual    Wheelchair assist level: Supervision/Verbal cueing Max wheelchair distance: 154f    Wheelchair 50 feet with 2 turns activity    Assist        Assist Level: Supervision/Verbal cueing   Wheelchair 150 feet activity     Assist      Assist Level: Supervision/Verbal cueing   Blood pressure (!) 123/91, pulse 88, temperature 98.4 F (36.9 C), resp. rate 17, height '6\' 4"'$  (1.93 m), weight (!) 144 kg, SpO2 98 %.  Medical Problem List and Plan: 1.   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 He may need something custom d/t size  7/16- con't PT and OT 2.  Antithrombotics: -DVT/anticoagulation: Lovenox             -antiplatelet therapy: N/A 3. Pain Management: Neurontin 300 mg 3 times daily, Zanaflex 4 mg nightly, oxycodone as needed 7/15- will add prn tizanidine for muscle spasm -discussed some stretches for right hip. Needs to be careful in short term to avoid overexerting with right hip flexion. This is more of a hip flexor strain then a groin strain, likely rectus femoris, possibly iliopsoas.   -prn heat 7/16- will give the indomethicin x2 days 25 mg BID and encouraged  pt to do the stretches given by Dr SNaaman Plummer4. Mood: Provide emotional support             -antipsychotic agents: N/A 5. Neuropsych: This patient is capable of making decisions on his own behalf. 6. Skin/Wound Care: Routine skin checks 7. Fluids/Electrolytes/Nutrition: Routine in and outs with follow-up chemistries 8.  Acute on chronic anemia.  Patient had received total 4 units packed red blood cells.  Continue iron supplement.  Follow-up CBC 9.  Diabetes mellitus with peripheral  neuropathy.  Hemoglobin A1c 7.8.  Lantus insulin 25 units twice daily, NovoLog 4 units 3 times daily with meals.    CBG (last 3)  Recent Labs    09/02/20 1724 09/02/20 2033 09/03/20 0624  GLUCAP 85 102* 97   7/15 fair to borderline control---observe for pattern  7/16- Good control in last 24 hours- con't to monitor 10.  Right elbow gout.  Uric acid 11.2.  Zyloprim 100 mg twice daily.  Course of colchicine has been completed.  -7/15 pain better. Pt takes indocin at home. Advised that he should be avoiding this with his kidney disease. I will give him a '25mg'$  bid x 2 days, then stop.  7/16- 7/16- was put in under suppository- put in again 25 mg BID x 2 days 11.  ID/sepsis.  Blood cultures 1/4 grew staph.  Completing course of amoxicillin after initially being maintained on cefepime  7/15 wbc's down to 10.2 12.  CKD stage III.   7/15 BUN/Cr 20/1.6 7/16- up from 1.48 the day prior- NO MORE NSDAIDS after 2 days of indomethacin 13.  Recent history C. difficile 06/2020.  Completing course of p.o. vancomycin.  Contact precautions discontinued 14.VRE/UTI.Contact precautions and complete course of Zyvox 14.  Obesity.  BMI 38.27.  Dietary follow-up 15.  Tachycardia.  Lopressor 12.5 mg twice daily.  HR controlled  7/16- HR controlled- con't regimen        LOS: 2 days A FACE TO FACE EVALUATION WAS PERFORMED  Manning Luna 09/03/2020, 10:38 AM

## 2020-09-03 NOTE — Progress Notes (Signed)
Physical Therapy Session Note  Patient Details  Name: ACEN MCVEIGH MRN: [id number] Date of Birth: [birth year]  Today's Date: 09/03/2020 PT Individual Time: 1100-1153 PT Individual Time Calculation (min): 53 min   Short Term Goals: Week 1:  PT Short Term Goal 1 (Week 1): pt will transfer sit<>stand with LRAD and CGA PT Short Term Goal 2 (Week 1): pt will transfer bed<>chair with LRAD and CGA PT Short Term Goal 3 (Week 1): pt will initate gait training  Skilled Therapeutic Interventions/Progress Updates:   Received pt sitting in WC, pt agreeable to PT treatment, and reported pain 8/10 in R groin. Pt requested to wait until after therapy for pain medication as it makes him "loopy". Repositioning, rest breaks, and distraction done to reduce pain levels. Session with emphasis on functional mobility/transfers, generalized strengthening, dynamic standing balance/coordination, and improved activity tolerance. Pt performed WC mobility 169f using BUE and supervision to [facility] and transported remainder of way in [place] to dayroom for energy conservation purposes. Pt transferred on/off Nustep via lateral scoot with min A and performed BUE and LLE strengthening on Nustep starting at workload 3 increasing to 5, then to 7 for 10 minutes for a total of 457 steps for improved cardiovascular endurance. R residual limb elevated on pillow as pt initially reported "throbbing" sensation. Pt transferred sit<>stand with RW and mod A with cues for RUE hand placement on RW and LUE hand placement on WC armrest. Pt reported L knee weakness and feeling like his knee was going to "buckle"; returned to sitting for safety. Pt performed the following exercises sitting [facility] and verbal cues for technique: -hip flexion on LLE 2x15 with 2lb ankle weight -LAQ on LLE 2x15 with 2lb ankle weight  -hip adduction ball squeezes 2x10 with 5 second isometric hold -overhead shoulder press with 11lb dowel 2x15 Pt  transported back to room in [facility]. Concluded session with pt sitting in WC, needs within reach, and chair pad alarm on.   Therapy Documentation Precautions:  Precautions Precautions: Fall Restrictions Weight Bearing Restrictions: Yes RLE Weight Bearing: Non weight bearing  Therapy/Group: Individual Therapy ATrystyn ArmondPT, DPT   09/03/2020, 7:25 AM

## 2020-09-03 NOTE — Progress Notes (Signed)
Physical Therapy Session Note  Patient Details  Name: Patrick [name] MRN: [id number] Date of Birth: 1965-06-17  Today's Date: 09/03/2020 PT Individual Time: 1300-1340 PT Individual Time Calculation (min): 40 min   Short Term Goals: Week 1:  PT Short Term Goal 1 (Week 1): pt will transfer sit<>stand with LRAD and CGA PT Short Term Goal 2 (Week 1): pt will transfer bed<>chair with LRAD and CGA PT Short Term Goal 3 (Week 1): pt will initate gait training Week 2:    Week 3:     Skilled Therapeutic Interventions/Progress Updates:   Pain:  Pt reports 8/10 pain groin, 6/10 residual limb.  Treatment to tolerance.  Rest breaks and repositioning as needed.   Pt initially oob in  and agreeable to treatment session w/focus on wc mobility and preprosthetic therex.  Wc propulsion w/supervision >266f, transported for time management rest of way to gym. Wc to mat lateral scoot w/supervision, set up, therapist secures wc.  Seated LAQx x 20 Sit to supine to prone w/additional time and supervision. Prone therex: Hip extension 2x10 Hamstring curls 2x15 Sidelying: Hip flexion/extension/abduction x 20 Unable to perform SLR due to groin pain Supine to sit on edge of mat w/cues, min assist. Squat pivot mat to wc w/therapist stabiling wc, supervision, cues.  Prop[facility] to [facility] w/supervision, elevator to room transport due to fatigue.. Pt left oob in wc w/alarm  set and needs in reach   Therapy Documentation Precautions:  Precautions Precautions: Fall Restrictions Weight Bearing Restrictions: Yes RLE Weight Bearing: Weight bearing as tolerated    Therapy/Group: Individual Therapy BCallie Fielding PRoyersford7/16/2022, 5:12 PM

## 2020-09-04 LAB — GLUCOSE, CAPILLARY
Glucose-Capillary: 116 mg/dL — ABNORMAL HIGH (ref 70–99)
Glucose-Capillary: 94 mg/dL (ref 70–99)
Glucose-Capillary: 185 mg/dL — ABNORMAL HIGH (ref 70–99)
Glucose-Capillary: 146 mg/dL — ABNORMAL HIGH (ref 70–99)

## 2020-09-04 NOTE — Progress Notes (Signed)
Patient ID: Patrick [name], male   DOB: [birth year], 55 y.o.   MRN: [id number]   Subjective:    Patient reports pain as moderate.  Was sleeping when I entered room.   Objective: Vital signs in last 24 hours: Temp:  [97.6 F (36.4 C)-99.5 F (37.5 C)] 97.6 F (36.4 C) (07/17 0456) Pulse Rate:  [80-94] 88 (07/17 0456) Resp:  [16-18] 18 (07/17 0456) BP: (125-136)/(70-100) 125/70 (07/17 0456) SpO2:  [99 %-100 %] 99 % (07/17 0456)  Intake/Output from previous day: 07/16 0701 - 07/17 0700 In: 240 [P.O.:240] Out: 800 [Urine:800] Intake/Output this shift: No intake/output data recorded.  Recent Labs    09/02/20 0635  HGB 7.7*   Recent Labs    09/02/20 0635  WBC 10.2  RBC 2.97*  HCT 24.9*  PLT 559*   Recent Labs    09/02/20 0635  NA 136  K 4.1  CL 104  CO2 22  BUN 20  CREATININE 1.60*  GLUCOSE 110*  CALCIUM 8.8*   No results for input(s): LABPT, INR in the last 72 hours.  Dressing intact.  No results found.  Assessment/Plan:    Plan:   continue post BKA rehab , ROM knee , strengthing, fall prevention.  Last A1C 7.8.  the three before that 12.4 to >15.5 for the last year.   Marybelle Killings 09/04/2020, 10:34 AM

## 2020-09-04 NOTE — Progress Notes (Signed)
PROGRESS NOTE   Subjective/Complaints:  Pt reports gout is basically resolved in R elbow and pain in R groin was 8/10- now 4/10.  Sleeping OK- L LBM this AM   ROS:   Pt denies SOB, abd pain, CP, N/V/C/D, and vision changes  Objective:   No results found. Recent Labs    09/02/20 0635  WBC 10.2  HGB 7.7*  HCT 24.9*  PLT 559*   Recent Labs    09/02/20 0635  NA 136  K 4.1  CL 104  CO2 22  GLUCOSE 110*  BUN 20  CREATININE 1.60*  CALCIUM 8.8*    Intake/Output Summary (Last 24 hours) at 09/04/2020 1618 Last data filed at 09/04/2020 1310 Gross per 24 hour  Intake 356 ml  Output 650 ml  Net -294 ml        Physical Exam: Vital Signs Blood pressure 140/90, pulse 89, temperature 99.1 F (37.3 C), temperature source Oral, resp. rate 18, height '6\' 4"'$  (1.93 m), weight (!) 144 kg, SpO2 99 %.     General: awake, alert, appropriate, laying in bed; sleepy; BMI 38; NAD HENT: conjugate gaze; oropharynx moist CV: regular rate; no JVD Pulmonary: CTA B/L; no W/R/R- good air movement GI: soft, NT, ND, (+)BS; protuberant Psychiatric: appropriate; interactive Neurological: Ox3  Psych: Pt's affect is appropriate. Pt is cooperative Skin: right bka bulbous/swollen, "T" incision with staples, minimal drainage. Looks the same with Shrinker in place this AM  Macerated area in abdominal/waist fold. Neuro: Pt is cognitively appropriate with normal insight, memory, and awareness. Cranial nerves 2-12 are intact. Sensory exam is normal. Reflexes are 2+ in all 4's. Fine motor coordination is intact. No tremors. Motor function is grossly 5/5 UE. RLE--can bend knee and lift leg against gravity..  Musculoskeletal: Right BK residual limb tender with palpation and ROM. Able to fully extend at the knee, flexion still limited d/t swelling. Right hip flexors sore, likely rectus femoris Less TTP in R groin this AM  Assessment/Plan: 1.  Functional deficits which require 3+ hours per day of interdisciplinary therapy in a comprehensive inpatient rehab setting. Physiatrist is providing close team supervision and 24 hour management of active medical problems listed below. Physiatrist and rehab team continue to assess barriers to discharge/monitor patient progress toward functional and medical goals  Care Tool:  Bathing    Body parts bathed by patient: Right arm, Left arm, Chest, Abdomen, Front perineal area, Right upper leg, Left upper leg, Face   Body parts bathed by helper: Buttocks, Left lower leg Body parts n/a: Right lower leg   Bathing assist Assist Level: Minimal Assistance - Patient > 75%     Upper Body Dressing/Undressing Upper body dressing   What is the patient wearing?: Pull over shirt    Upper body assist Assist Level: Set up assist    Lower Body Dressing/Undressing Lower body dressing      What is the patient wearing?: Pants     Lower body assist Assist for lower body dressing: Minimal Assistance - Patient > 75%     Toileting Toileting    Toileting assist Assist for toileting: Contact Guard/Touching assist     Transfers Chair/bed transfer  Transfers assist  Chair/bed transfer activity did not occur: Safety/medical concerns  Chair/bed transfer assist level: Minimal Assistance - Patient > 75%     Locomotion Ambulation   Ambulation assist   Ambulation activity did not occur: Safety/medical concerns (weakness, fatigue, decreasd balance/postural control)          Walk 10 feet activity   Assist  Walk 10 feet activity did not occur: Safety/medical concerns (weakness, fatigue, decreasd balance/postural control)        Walk 50 feet activity   Assist Walk 50 feet with 2 turns activity did not occur: Safety/medical concerns (weakness, fatigue, decreasd balance/postural control)         Walk 150 feet activity   Assist Walk 150 feet activity did not occur: Safety/medical  concerns (weakness, fatigue, decreasd balance/postural control)         Walk 10 feet on uneven surface  activity   Assist Walk 10 feet on uneven surfaces activity did not occur: Safety/medical concerns (weakness, fatigue, decreasd balance/postural control)         Wheelchair     Assist Will patient use wheelchair at discharge?: Yes Type of Wheelchair: Manual    Wheelchair assist level: Supervision/Verbal cueing Max wheelchair distance: 133f    Wheelchair 50 feet with 2 turns activity    Assist        Assist Level: Supervision/Verbal cueing   Wheelchair 150 feet activity     Assist      Assist Level: Supervision/Verbal cueing   Blood pressure 140/90, pulse 89, temperature 99.1 F (37.3 C), temperature source Oral, resp. rate 18, height '6\' 4"'$  (1.93 m), weight (!) 144 kg, SpO2 99 %.  Medical Problem List and Plan: 1.   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 He may need something custom d/t size  Con't PT and OT- NWB on R BKA 2.  Antithrombotics: -DVT/anticoagulation: Lovenox             -antiplatelet therapy: N/A 3. Pain Management: Neurontin 300 mg 3 times daily, Zanaflex 4 mg nightly, oxycodone as needed 7/15- will add prn tizanidine for muscle spasm -discussed some stretches for right hip. Needs to be careful in short term to avoid overexerting with right hip flexion. This is more of a hip flexor strain then a groin strain, likely rectus femoris, possibly iliopsoas.   -prn heat 7/16- will give the indomethicin x2 days 25 mg BID and encouraged pt to do the stretches given by Dr SNaaman Plummer7/17- Pain down >50%- will finish Indomethacin tonight 4. Mood: Provide emotional support             -antipsychotic  agents: N/A 5. Neuropsych: This patient is capable of making decisions on his own behalf. 6. Skin/Wound Care: Routine skin checks 7. Fluids/Electrolytes/Nutrition: Routine in and outs with follow-up chemistries 8.  Acute on chronic anemia.  Patient had received total 4 units packed red blood cells.  Continue iron supplement.  Follow-up CBC 9.  Diabetes mellitus with peripheral neuropathy.  Hemoglobin A1c 7.8.  Lantus insulin 25 units twice daily, NovoLog 4 units 3 times daily with meals.    CBG (last  3)  Recent Labs    09/03/20 2112 09/04/20 0609 09/04/20 1132  GLUCAP 179* 116* 94   7/17- Bgs 94-179- controlled- con't regimen 10.  Right elbow gout.  Uric acid 11.2.  Zyloprim 100 mg twice daily.  Course of colchicine has been completed.  -7/15 pain better. Pt takes indocin at home. Advised that he should be avoiding this with his kidney disease. I will give him a '25mg'$  bid x 2 days, then stop.  7/16- 7/16- was put in under suppository- put in again 25 mg BID x 2 days 11.  ID/sepsis.  Blood cultures 1/4 grew staph.  Completing course of amoxicillin after initially being maintained on cefepime  7/15 wbc's down to 10.2 12.  CKD stage III.   7/15 BUN/Cr 20/1.6 7/16- up from 1.48 the day prior- NO MORE NSDAIDS after 2 days of indomethacin 7/17- labs in AM 13.  Recent history C. difficile 06/2020.  Completing course of p.o. vancomycin.  Contact precautions discontinued 14.VRE/UTI.Contact precautions and complete course of Zyvox 14.  Obesity.  BMI 38.27.  Dietary follow-up 15.  Tachycardia.  Lopressor 12.5 mg twice daily.  HR controlled  7/16- HR controlled- con't regimen        LOS: 3 days A FACE TO FACE EVALUATION WAS PERFORMED  Cesily Cuoco 09/04/2020, 4:18 PM

## 2020-09-05 DIAGNOSIS — N1831 Chronic kidney disease, stage 3a: Secondary | ICD-10-CM

## 2020-09-05 DIAGNOSIS — D62 Acute posthemorrhagic anemia: Secondary | ICD-10-CM

## 2020-09-05 DIAGNOSIS — I739 Peripheral vascular disease, unspecified: Secondary | ICD-10-CM

## 2020-09-05 DIAGNOSIS — M10021 Idiopathic gout, right elbow: Secondary | ICD-10-CM

## 2020-09-05 LAB — CULTURE, BLOOD (ROUTINE X 2)
Culture: NO GROWTH
Culture: NO GROWTH

## 2020-09-05 LAB — BASIC METABOLIC PANEL
Anion gap: 9 (ref 5–15)
BUN: 22 mg/dL — ABNORMAL HIGH (ref 6–20)
CO2: 22 mmol/L (ref 22–32)
Calcium: 8.9 mg/dL (ref 8.9–10.3)
Chloride: 108 mmol/L (ref 98–111)
Creatinine, Ser: 1.67 mg/dL — ABNORMAL HIGH (ref 0.61–1.24)
GFR, Estimated: 49 mL/min — ABNORMAL LOW (ref >60)
Glucose, Bld: 100 mg/dL — ABNORMAL HIGH (ref 70–99)
Potassium: 4.3 mmol/L (ref 3.5–5.1)
Sodium: 139 mmol/L (ref 135–145)

## 2020-09-05 LAB — CBC WITH DIFFERENTIAL/PLATELET
Abs Immature Granulocytes: 0.04 10*3/uL (ref 0.00–0.07)
Basophils Absolute: 0.1 10*3/uL (ref 0.0–0.1)
Basophils Relative: 1 %
Eosinophils Absolute: 0.4 10*3/uL (ref 0.0–0.5)
Eosinophils Relative: 3 %
HCT: 27.9 % — ABNORMAL LOW (ref 39.0–52.0)
Hemoglobin: 8.4 g/dL — ABNORMAL LOW (ref 13.0–17.0)
Immature Granulocytes: 0 %
Lymphocytes Relative: 21 %
Lymphs Abs: 2.3 10*3/uL (ref 0.7–4.0)
MCH: 25.4 pg — ABNORMAL LOW (ref 26.0–34.0)
MCHC: 30.1 g/dL (ref 30.0–36.0)
MCV: 84.3 fL (ref 80.0–100.0)
Monocytes Absolute: 0.8 10*3/uL (ref 0.1–1.0)
Monocytes Relative: 8 %
Neutro Abs: 7.2 10*3/uL (ref 1.7–7.7)
Neutrophils Relative %: 67 %
Platelets: 638 10*3/uL — ABNORMAL HIGH (ref 150–400)
RBC: 3.31 MIL/uL — ABNORMAL LOW (ref 4.22–5.81)
RDW: 16.9 % — ABNORMAL HIGH (ref 11.5–15.5)
WBC: 10.8 10*3/uL — ABNORMAL HIGH (ref 4.0–10.5)
nRBC: 0 % (ref 0.0–0.2)

## 2020-09-05 LAB — GLUCOSE, CAPILLARY
Glucose-Capillary: 100 mg/dL — ABNORMAL HIGH (ref 70–99)
Glucose-Capillary: 125 mg/dL — ABNORMAL HIGH (ref 70–99)
Glucose-Capillary: 104 mg/dL — ABNORMAL HIGH (ref 70–99)
Glucose-Capillary: 108 mg/dL — ABNORMAL HIGH (ref 70–99)

## 2020-09-05 NOTE — Progress Notes (Signed)
Physical Therapy Session Note  Patient Details  Name: Patrick [name] MRN: [id number] Date of Birth: March 27, 1965  Today's Date: 09/05/2020 PT Individual Time: 1100-1153 and 1400-1455 PT Individual Time Calculation (min): 53 min and 55 min  Short Term Goals: Week 1:  PT Short Term Goal 1 (Week 1): pt will transfer sit<>stand with LRAD and CGA PT Short Term Goal 2 (Week 1): pt will transfer bed<>chair with LRAD and CGA PT Short Term Goal 3 (Week 1): pt will initate gait training  Skilled Therapeutic Interventions/Progress Updates:   Treatment Session 1 Received pt sitting in WC, pt agreeable to PT treatment, and reported pain 4/10 in R groin (premedicated) Repositioning, rest breaks, and distraction done to reduce pain levels. Session with emphasis on functional mobility/transfers, generalized strengthening, dynamic standing balance/coordination, gait training, and improved activity tolerance. Pt performed WC mobility 171f x 1 and 786fx 1 using BUE and supervision and transported remainder of way to [facility] for energy conservation purposes. Sit<>stand with RW and CGA/min A and ambulated [id number] 1 and 22f2f1 with RW and CGA to mat using a "hopping" pattern; cues for smaller hops and to keep RW within BOS. Worked on dynamic standing balance "hopping" in place on LLE 3x10 with emphasis on soft landing to prevent excessive forces on L knee. Worked on technique for squat<>pivot transfers -pt required min cues for set up and safety and CGA for lateral scoot to/from mat x 2 trials. Pt performed the following exercises sitting EOM with supervision and verbal cues for technique:  -seated trunk rotations with 6lb medicine ball 2x10 bilaterally  -overhead chest press<>horizontal chest press with 6lb medicine ball 2x10 -LAQ on LLE with 7lb ankle weight 2x15 -hip flexion on LLE with 3lb ankle weight 1x15 then decreasing to 1lb for 1x5 Educated pt on WC parts management including donning/doffing  legrests/armrests -pt requires min cues overall. Pt transported back to room in WC [facility] A. Concluded session with pt sitting in WC, needs within reach, and chair pad alarm on.   Treatment Session 2 Received pt sitting in WC, pt agreeable to PT treatment, and denied any pain during session. Session with emphasis on functional mobility/transfers, generalized strengthening, dynamic standing balance/coordination, gait training, simulated car transfers, and improved activity tolerance. Pt transported to 4M ortho gym in WC [facility] A for time management and energy conservation purposes. Pt performed simulated car transfer without AD and CGA with cues for WC set up and parts management. Pt then ambulated 22ft37funeven surfaces (ramp) with RW and min A. Pt with decreased LLE foot clearance with fatigue and required extensive rest break afterwards. Pt then performed BUE strengthening on UBE at level 7 increasing to level 9 for 6 minutes alternating 1 minute forward and 1 minute backwards; reported 8/10 fatigue after activity. Pt performed LLE strengthening on Kinetron at 20 cm/sec for 1 minute x 4 trials with therapist providing manual counter resistance with emphasis on quad/glute strengthening and core control. Pt performed WC mobility 1273ft 40fg BUE and supervision back towards room but transported [facility] way [facility] due to UE fatigue. Concluded session with pt sitting in WC, needs within reach, and chair pad alarm on. Provided pt with fresh drinks.  Notified MD of need for additional shrinker as pt only has one; [employer] to come Friday to assess pt.    Therapy Documentation Precautions:  Precautions Precautions: Fall Restrictions Weight Bearing Restrictions: Yes RLE Weight Bearing: Non weight bearing  Therapy/Group: Individual Therapy Jamad Gravlin PT, DPT   09/05/2020, 7:26 AM

## 2020-09-05 NOTE — Progress Notes (Addendum)
PROGRESS NOTE   Subjective/Complaints:  Feeling well. Residual limb still sore. Wearing shrinker. Saw prosthetist Friday  ROS: Patient denies fever, rash, sore throat, blurred vision, nausea, vomiting, diarrhea, cough, shortness of breath or chest pain,  headache, or mood change.   Objective:   No results found. Recent Labs    09/05/20 0630  WBC 10.8*  HGB 8.4*  HCT 27.9*  PLT 638*   Recent Labs    09/05/20 0630  NA 139  K 4.3  CL 108  CO2 22  GLUCOSE 100*  BUN 22*  CREATININE 1.67*  CALCIUM 8.9    Intake/Output Summary (Last 24 hours) at 09/05/2020 0957 Last data filed at 09/05/2020 0900 Gross per 24 hour  Intake 356 ml  Output 1100 ml  Net -744 ml        Physical Exam: Vital Signs Blood pressure 121/86, pulse 92, temperature 98 F (36.7 C), temperature source Oral, resp. rate 18, height '6\' 4"'$  (1.93 m), weight (!) 144 kg, SpO2 97 %.     Constitutional: No distress . Vital signs reviewed. HEENT: EOMI, oral membranes moist Neck: supple Cardiovascular: RRR without murmur. No JVD    Respiratory/Chest: CTA Bilaterally without wheezes or rales. Normal effort    GI/Abdomen: BS +, non-tender, non-distended Ext: no clubbing, cyanosis, or edema Psych: pleasant and cooperative  Skin: right bka is less bulbous/swollen, "T" incision with staples, minimal drainage. Shrinker in place this AM  Macerated area in abdominal/waist fold dryer. Neuro: Pt is cognitively appropriate with normal insight, memory, and awareness. Cranial nerves 2-12 are intact. Sensory exam is normal. Reflexes are 2+ in all 4's. Fine motor coordination is intact. No tremors. Motor function is grossly 5/5 UE. RLE--gravity strength.  Musculoskeletal: Right BK residual limb tender with palpation and ROM. Able to fully extend at the knee, flexion still limited d/t swelling. Right hip flexors sore, likely rectus femoris   Assessment/Plan: 1.  Functional deficits which require 3+ hours per day of interdisciplinary therapy in a comprehensive inpatient rehab setting. Physiatrist is providing close team supervision and 24 hour management of active medical problems listed below. Physiatrist and rehab team continue to assess barriers to discharge/monitor patient progress toward functional and medical goals  Care Tool:  Bathing    Body parts bathed by patient: Right arm, Left arm, Chest, Abdomen, Front perineal area, Right upper leg, Left upper leg, Face   Body parts bathed by helper: Buttocks, Left lower leg Body parts n/a: Right lower leg   Bathing assist Assist Level: Minimal Assistance - Patient > 75%     Upper Body Dressing/Undressing Upper body dressing   What is the patient wearing?: Pull over shirt    Upper body assist Assist Level: Set up assist    Lower Body Dressing/Undressing Lower body dressing      What is the patient wearing?: Pants     Lower body assist Assist for lower body dressing: Minimal Assistance - Patient > 75%     Toileting Toileting    Toileting assist Assist for toileting: Contact Guard/Touching assist     Transfers Chair/bed transfer  Transfers assist  Chair/bed transfer activity did not occur: Safety/medical concerns  Chair/bed transfer  assist level: Minimal Assistance - Patient > 75%     Locomotion Ambulation   Ambulation assist   Ambulation activity did not occur: Safety/medical concerns (weakness, fatigue, decreasd balance/postural control)          Walk 10 feet activity   Assist  Walk 10 feet activity did not occur: Safety/medical concerns (weakness, fatigue, decreasd balance/postural control)        Walk 50 feet activity   Assist Walk 50 feet with 2 turns activity did not occur: Safety/medical concerns (weakness, fatigue, decreasd balance/postural control)         Walk 150 feet activity   Assist Walk 150 feet activity did not occur: Safety/medical  concerns (weakness, fatigue, decreasd balance/postural control)         Walk 10 feet on uneven surface  activity   Assist Walk 10 feet on uneven surfaces activity did not occur: Safety/medical concerns (weakness, fatigue, decreasd balance/postural control)         Wheelchair     Assist Will patient use wheelchair at discharge?: Yes Type of Wheelchair: Manual    Wheelchair assist level: Supervision/Verbal cueing Max wheelchair distance: 115f    Wheelchair 50 feet with 2 turns activity    Assist        Assist Level: Supervision/Verbal cueing   Wheelchair 150 feet activity     Assist      Assist Level: Supervision/Verbal cueing   Blood pressure 121/86, pulse 92, temperature 98 F (36.7 C), temperature source Oral, resp. rate 18, height '6\' 4"'$  (1.93 m), weight (!) 144 kg, SpO2 97 %.  Medical Problem List and Plan: 1.   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  Antithrombotics: -DVT/anticoagulation: Lovenox             -antiplatelet therapy: N/A 3. Pain Management: Neurontin 300 mg 3 times daily, Zanaflex 4 mg nightly, oxycodone as needed 7/15- will add prn tizanidine for muscle spasm -discussed some stretches for right hip. Needs to be careful in short term to avoid overexerting with right hip flexion. This is more of a hip flexor strain then a groin strain, likely rectus femoris, possibly iliopsoas.   -prn heat 4. Mood: Provide emotional support             -antipsychotic agents: N/A 5. Neuropsych: This patient is capable of making decisions on his own behalf. 6. Skin/Wound Care: Routine skin checks 7. Fluids/Electrolytes/Nutrition: Routine in and outs with follow-up chemistries 8.  Acute on chronic  anemia.  Patient had received total 4 units packed red blood cells.  Continue iron supplement.  Follow-up hgb 8.4 7/18 9.  Diabetes mellitus with peripheral neuropathy.  Hemoglobin A1c 7.8.  Lantus insulin 25 units twice daily, NovoLog 4 units 3 times daily with meals.    CBG (last 3)  Recent Labs    09/04/20 1657 09/04/20 2111 09/05/20 0608  GLUCAP 185* 146* 100*   7/18 fair control, continue regimen 10.  Right elbow gout.  Uric acid 11.2.  Zyloprim 100 mg twice daily.  Course of colchicine has been completed.  -7/16 improved after 2 days indocin 11.  ID/sepsis.  Blood cultures 1/4 grew staph.  Completing course of amoxicillin after initially being maintained on cefepime  7/15 wbc's down to 10.2 12.  CKD stage III.   7/15 BUN/Cr 20/1.6 7/18 Cr. Slightly elevated. Encourage fluids, only received 2 days nsaid -f/u Thursday 13.  Recent history C. difficile 06/2020.  Completing course of p.o. vancomycin.  Contact precautions discontinued 14.VRE/UTI.Contact precautions and complete course of Zyvox 14.  Obesity.  BMI 38.27.  Dietary follow-up 15.  Tachycardia.  Lopressor 12.5 mg twice daily.  HR controlled  7/18- HR controlled- con't regimen        LOS: 4 days A FACE TO FACE EVALUATION WAS PERFORMED  Meredith Staggers 09/05/2020, 9:57 AM

## 2020-09-05 NOTE — Progress Notes (Signed)
Inpatient Rehabilitation Admissions Coordinator   I was notified by [facility] that insurance termed 5/21 per their system. I met at bedside wit patient and he is aware that we and hospital obtained [place] was active at those times. I have alerted SW, [place] of payor status.  Danne Baxter, RN, MSN Rehab Admissions Coordinator [phone] 09/05/2020 2:04 PM

## 2020-09-05 NOTE — Progress Notes (Signed)
Occupational Therapy Session Note  Patient Details  Name: Patrick [name] MRN: [id number] Date of Birth: 1966/01/12  Today's Date: 09/05/2020 OT Individual Time: [id number] OT Individual Time Calculation (min): 54 min    Short Term Goals: Week 1:  OT Short Term Goal 1 (Week 1): Pt will complete LB dressing at sit > stand level with CGA OT Short Term Goal 2 (Week 1): Pt will complete toilet transfer with supervision OT Short Term Goal 3 (Week 1): Pt will complete bathing at sit > stand level with CGA  Skilled Therapeutic Interventions/Progress Updates:    Treatment session with focus on self-care retraining, functional transfers, sit> stand, and dynamic standing balance.  Pt received seated EOB with RN present administering morning meds.  Pt donned pants seated EOB and then completed lateral scoot transfer to w/c with CGA.  Pt pulled pants over hips with lateral leans in w/c with use of bed to allow for further lean.  Pt propelled w/c to therapy gym for BUE strengthening and mobility.  Engaged in sit > stand from elevated therapy mat, progressively lowering throughout session.  Pt with CGA with RW for sit > stand even as mat lowered.  Engaged in horse shoe tossing in standing with focus on alternating UE support, while educating on carryover to completing self-care tasks at sit > stand level.  Pt completed 2-3 forward then backwards hops x3 with min assist.  Pt completed stand pivot transfer mat > w/c with RW with min assist.  Pt returned to room and remained upright in w/c with chair alarm on and all needs in reach.  Therapy Documentation Precautions:  Precautions Precautions: Fall Restrictions Weight Bearing Restrictions: Yes RLE Weight Bearing: Non weight bearing General:   Vital Signs:   Pain: Pt with c/o pain 6/10 in groin.  Premedicated.   Therapy/Group: Individual Therapy  Simonne Come 09/05/2020, 10:46 AM

## 2020-09-06 LAB — GLUCOSE, CAPILLARY
Glucose-Capillary: 98 mg/dL (ref 70–99)
Glucose-Capillary: 125 mg/dL — ABNORMAL HIGH (ref 70–99)
Glucose-Capillary: 200 mg/dL — ABNORMAL HIGH (ref 70–99)
Glucose-Capillary: 138 mg/dL — ABNORMAL HIGH (ref 70–99)

## 2020-09-06 NOTE — Progress Notes (Signed)
Occupational Therapy Session Note  Patient Details  Name: Patrick [name] MRN: [id number] Date of Birth: April 06, 1965  Today's Date: 09/06/2020 OT Individual Time: [id number] OT Individual Time Calculation (min): 43 min    Short Term Goals: Week 1:  OT Short Term Goal 1 (Week 1): Pt will complete LB dressing at sit > stand level with CGA OT Short Term Goal 2 (Week 1): Pt will complete toilet transfer with supervision OT Short Term Goal 3 (Week 1): Pt will complete bathing at sit > stand level with CGA  Skilled Therapeutic Interventions/Progress Updates:    Treatment session with focus on BUE strength and endurance.  Pt received upright in w/c agreeable to therapy session.  Discussed self-care goals and shower transfers, pt reports completing transfer in tub/shower during previous OT session.  Pt propelled w/c to therapy gym for BUE strengthening and endurance.  Pt completed lateral scoot transfer CGA to therapy mat.  Engaged in 2 sets of 15 chest presses, overhead presses, and PNF pattern diagonals with 3kg medicine ball.  Engaged in 3 sets of 15 tosses with rebounder for endurance and sitting balance challenge. Therapist educated pt on use of massage, tapping, and mirror therapy for residual limb phantom sensations and pain.  Pt reports fatigue in BUE strengthening, therefore therapist pushed pt in w/c back to room.  Pt remained upright in w/c with chair alarm on and all needs in reach.  Therapy Documentation Precautions:  Precautions Precautions: Fall Restrictions Weight Bearing Restrictions: Yes RLE Weight Bearing: Non weight bearing  Pain: Pain Assessment Pain Scale: 0-10 Pain Score: 0-No pain   Therapy/Group: Individual Therapy  Simonne Come 09/06/2020, 12:14 PM

## 2020-09-06 NOTE — Patient Care Conference (Signed)
Inpatient RehabilitationTeam Conference and Plan of Care Update Date: 09/06/2020   Time: 1:40 PM    Patient Name: Patrick [name]      Medical Record Number: [id number]  Date of Birth: [birth year] Sex: Male         Room/Bed: 5C08C/5C08C-01 Payor Info: Payor: Theme park manager / Plan: Theme park manager OTHER / Product Type: *No Product type* /    Admit Date/Time:  09/01/2020  3:22 PM  Primary Diagnosis:  Right below-knee amputee [facility])  Hospital Problems: Principal Problem:   Right below-knee amputee ([place]) Active Problems:   GOUT   CKD (chronic kidney disease) stage 3, GFR 30-59 ml/min (HCC)   ABLA (acute blood loss anemia)    Expected Discharge Date: Expected Discharge Date: 09/13/20  Team Members Present: Physician leading conference: Dr. Leeroy Cha Care Coodinator Present: Ovidio Kin, LCSW;Shandelle Borrelli Creig Hines, RN, BSN, [place] Nurse Present: Dorthula Nettles, RN PT Present: Becky Sax, PT OT Present: Simonne Come, OT PPS Coordinator present : Gunnar Fusi, SLP     Current Status/Progress Goal Weekly Team Focus  Bowel/Bladder   continent b/b  remain continent      Swallow/Nutrition/ Hydration             ADL's   CGA bathing and dressing with lateral leans, CGA sit > stand, Min A stand pivot transfers with RW  Mod I sit > stand, toileting tasks, and UB dressing; Supervision transfers, LB dressing, bathing, and dynamic standing balance  ADL retraining, functional transfers, BUE strengthening and endurance, dynamic standing balance   Mobility   bed mobility supervision, lateral scoot transfers CGA, stand<>pivot transfers CGA, gait 51f with RW CGA, WC mobility [id number]  Mod I/supervision  functional mobility/transfers, generalized strengtheningg, dynamic standing balance/coordination, gait training, D/C planning, endurance.   Communication             Safety/Cognition/ Behavioral Observations            Pain   7/10 pain, pain medications effective  < 3 on a 0-10  pain scale  assess q 4 hr and prn   Skin   R BKA, MASD to groin treated with Nystatin  no new breakdown while on CIR  assess skin q shift and prn     Discharge Planning:  HOme with friends who work and pt will need to be safe alone. His insurance has termed and he is uninsured now.   Team Discussion: Cr. Elevated, encourage 6-8 glassed of water per day. Hgb stable. Continent B/B, LBM 09/05/20. Pain medication is effective. MASD to the groin, treating with Nystatin, right BKA incision site healing with no problems. Patient on target to meet rehab goals: yes, supervision for bed mobility, lateral scoot transfers contact guard assist. Needs cues for safety. Supervision for W/C > 150 ft. Gait 4161fith RW and contact guard assist. Contact guard for bathing and dressing, sit to stands. Min assist for stand pivot transfers with RW. Mod I goals, most Supervision. In a hurry to get things done.  *See Care Plan and progress notes for long and short-term goals.   Revisions to Treatment Plan:  Encourage 6-8 glasses of water per day.  Teaching Needs: Family education, medication management, pain management, skin/wound care, transfer training, gait training, balance training, endurance training, safety awareness.  Current Barriers to Discharge: Decreased caregiver support, Medical stability, Home enviroment access/layout, Wound care, Lack of/limited family support, Weight, Weight bearing restrictions, Medication compliance, and Behavior  Possible Resolutions to Barriers: Continue current medications, provide emotional support.  Medical Summary Current Status: right BKA, MASD to groin, leukocytosis, anemia, AKI, obesity (BMI 38.64), vitals stable  Barriers to Discharge: Wound care;Medical stability  Barriers to Discharge Comments: right BKA, MASD to groin, leukocytosis, anemia, AKI, obesity (BMI 38.64) Possible Resolutions to [employer]: daily wound care and education, nystatin to groin,  monitor WBC, encourage 6-8 glasses of water per day and repeat creatinine tomorrow, provide dietary education   Continued Need for Acute Rehabilitation Level of Care: The patient requires daily medical management by a physician with specialized training in physical medicine and rehabilitation for the following reasons: Direction of a multidisciplinary physical rehabilitation program to maximize functional independence : Yes Medical management of patient stability for increased activity during participation in an intensive rehabilitation regime.: Yes Analysis of laboratory values and/or radiology reports with any subsequent need for medication adjustment and/or medical intervention. : Yes   I attest that I was present, lead the team conference, and concur with the assessment and plan of the team.   Cristi Loron 09/06/2020, 1:47 PM

## 2020-09-06 NOTE — Progress Notes (Signed)
Patient ID: Patrick [name], male   DOB: [birth year], 55 y.o.   MRN: [id number] Met with pt to update team conference goals mod/I-supervision level and discharge date 7/26. Discussed insurance has termed and so now uninsured, what equipment is most important-rolling walker. Will order rolling walker and pt reports he plans on getting a scooter for work and will get tub bench on own. He does not feel he needs a drop-arm commode, feels can use the bathroom and has been for the past few days here and not the drop-arm commode. Will work on discharge needs. Pt is doing well and making good progress.

## 2020-09-06 NOTE — Progress Notes (Signed)
Occupational Therapy Session Note  Patient Details  Name: HURL RO MRN: [id number] Date of Birth: Feb 16, 1966  Today's Date: 09/06/2020 OT Individual Time: [id number] OT Individual Time Calculation (min): 55 min    Short Term Goals: Week 1:  OT Short Term Goal 1 (Week 1): Pt will complete LB dressing at sit > stand level with CGA OT Short Term Goal 2 (Week 1): Pt will complete toilet transfer with supervision OT Short Term Goal 3 (Week 1): Pt will complete bathing at sit > stand level with CGA  Skilled Therapeutic Interventions/Progress Updates:    Pt resting in w/c upon arrival and agreeable to therapy. Pt declined bathing/dressing, stating he had already completed those tasks. Pt propelled w/c to central elevators. Pt propelled w/c to ADL tub room and practiced TTB transfers with CGA. Pt provided handout on TTB. Discussed home setup and bathroom setup. Pt declining BSC at this time. Pt propelled back to central elevators. Pt transported to room for time mgmt. Pt remained in w/c with all needs within reach and seat alarm activated.   Therapy Documentation Precautions:  Precautions Precautions: Fall Restrictions Weight Bearing Restrictions: Yes RLE Weight Bearing: Non weight bearing   Pain: Pt denies pain this morning   Therapy/Group: Individual Therapy  Leroy Libman 09/06/2020, 10:26 AM

## 2020-09-06 NOTE — Progress Notes (Signed)
PROGRESS NOTE   Subjective/Complaints: No complaints this morning initially When asked, states he does have residual limb pain and he is trying to avoid oxycodone until after therapy as it sedates him.   ROS: +residual limb pain  Objective:   No results found. Recent Labs    09/05/20 0630  WBC 10.8*  HGB 8.4*  HCT 27.9*  PLT 638*   Recent Labs    09/05/20 0630  NA 139  K 4.3  CL 108  CO2 22  GLUCOSE 100*  BUN 22*  CREATININE 1.67*  CALCIUM 8.9    Intake/Output Summary (Last 24 hours) at 09/06/2020 1234 Last data filed at 09/06/2020 0900 Gross per 24 hour  Intake 720 ml  Output 1250 ml  Net -530 ml        Physical Exam: Vital Signs Blood pressure 131/77, pulse 92, temperature 98.6 F (37 C), temperature source Oral, resp. rate 18, height '6\' 4"'$  (1.93 m), weight (!) 144 kg, SpO2 98 %. Gen: no distress, normal appearing HEENT: oral mucosa pink and moist, NCAT Cardio: Reg rate Chest: normal effort, normal rate of breathing Abd: soft, non-distended Ext: no edema Psych: pleasant, normal affect Skin: right bka is less bulbous/swollen, "T" incision with staples, minimal drainage. Shrinker in place this AM  Macerated area in abdominal/waist fold dryer. Neuro: Pt is cognitively appropriate with normal insight, memory, and awareness. Cranial nerves 2-12 are intact. Sensory exam is normal. Reflexes are 2+ in all 4's. Fine motor coordination is intact. No tremors. Motor function is grossly 5/5 UE. RLE--gravity strength. Musculoskeletal: Right BK residual limb tender with palpation and ROM. Able to fully extend at the knee, flexion still limited d/t swelling. Right hip flexors sore, likely rectus femoris   Assessment/Plan: 1. Functional deficits which require 3+ hours per day of interdisciplinary therapy in a comprehensive inpatient rehab setting. Physiatrist is providing close team supervision and 24 hour  management of active medical problems listed below. Physiatrist and rehab team continue to assess barriers to discharge/monitor patient progress toward functional and medical goals  Care Tool:  Bathing    Body parts bathed by patient: Right arm, Left arm, Chest, Abdomen, Front perineal area, Right upper leg, Left upper leg, Face   Body parts bathed by helper: Buttocks, Left lower leg Body parts n/a: Right lower leg   Bathing assist Assist Level: Minimal Assistance - Patient > 75%     Upper Body Dressing/Undressing Upper body dressing   What is the patient wearing?: Pull over shirt    Upper body assist Assist Level: Set up assist    Lower Body Dressing/Undressing Lower body dressing      What is the patient wearing?: Pants     Lower body assist Assist for lower body dressing: Contact Guard/Touching assist     Toileting Toileting    Toileting assist Assist for toileting: Contact Guard/Touching assist     Transfers Chair/bed transfer  Transfers assist  Chair/bed transfer activity did not occur: Safety/medical concerns  Chair/bed transfer assist level: Contact Guard/Touching assist     Locomotion Ambulation   Ambulation assist   Ambulation activity did not occur: Safety/medical concerns (weakness, fatigue, decreasd balance/postural control)  Assist  level: Contact Guard/Touching assist Assistive device: Walker-rolling Max distance: 45f   Walk 10 feet activity   Assist  Walk 10 feet activity did not occur: Safety/medical concerns (weakness, fatigue, decreasd balance/postural control)  Assist level: Contact Guard/Touching assist Assistive device: Walker-rolling   Walk 50 feet activity   Assist Walk 50 feet with 2 turns activity did not occur: Safety/medical concerns (weakness, fatigue, decreasd balance/postural control)         Walk 150 feet activity   Assist Walk 150 feet activity did not occur: Safety/medical concerns (weakness, fatigue,  decreasd balance/postural control)         Walk 10 feet on uneven surface  activity   Assist Walk 10 feet on uneven surfaces activity did not occur: Safety/medical concerns (weakness, fatigue, decreasd balance/postural control)   Assist level: Minimal Assistance - Patient > 75% Assistive device: WAeronautical engineerWill patient use wheelchair at discharge?: Yes Type of Wheelchair: Manual    Wheelchair assist level: Supervision/Verbal cueing Max wheelchair distance: 1566f   Wheelchair 50 feet with 2 turns activity    Assist        Assist Level: Supervision/Verbal cueing   Wheelchair 150 feet activity     Assist      Assist Level: Supervision/Verbal cueing   Blood pressure 131/77, pulse 92, temperature 98.6 F (37 C), temperature source Oral, resp. rate 18, height '6\' 4"'$  (1.93 m), weight (!) 144 kg, SpO2 98 %.    Medical Problem List and Plan: 1.   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  Impaired mobility, ambulating 40 feet: continue Lovenox             -antiplatelet therapy: N/A 3. Pain Management: Neurontin 300 mg 3 times daily, oxycodone as needed 7/15- will add prn tizanidine for muscle spasm -discussed some stretches for right hip. Needs to be careful in short term to avoid overexerting with right hip flexion. This is more of a hip flexor strain then a groin strain, likely rectus femoris, possibly iliopsoas.   -prn heat -d/c zanaflex given elevated LFTs.  4. Mood: Provide emotional support             -antipsychotic agents: N/A 5. Neuropsych: This patient is capable of making decisions on his own behalf. 6.  Skin/Wound Care: Routine skin checks 7. Fluids/Electrolytes/Nutrition: Routine in and outs with follow-up chemistries 8.  Acute on chronic anemia.  Patient had received total 4 units packed red blood cells.  Continue iron supplement.  Follow-up hgb 8.4 7/18 9.  Diabetes mellitus with peripheral neuropathy.  Hemoglobin A1c 7.8.  Lantus insulin 25 units twice daily, NovoLog 4 units 3 times daily with meals.    CBG (last 3)  Recent Labs (last 2 labs)        Recent Labs    09/04/20 1657 09/04/20 2111 09/05/20 0608  GLUCAP 185* 146* 100*     7/19 fair control, continue regimen  10.  Right elbow gout.  Uric acid 11.2.  Zyloprim 100 mg twice daily.  Course of colchicine has been completed.             -7/16 improved after 2 days indocin 11.  ID/sepsis.  Blood cultures 1/4 grew staph.  Completing course of amoxicillin after initially being maintained on cefepime             7/15 wbc's down to 10.2 12.  CKD stage III.   7/15 BUN/Cr 20/1.6 Cr. Slightly elevated. Only received 2 days nsaid. Educated regarding staying well hydrated.  -f/u Thursday 13.  Recent history C. difficile 06/2020.  Completing course of p.o. vancomycin.  Contact precautions discontinued 14.VRE/UTI.Contact precautions and complete course of Zyvox 14.  Obesity.  BMI 38.27.  Dietary follow-up 15.  Tachycardia.  Continue Lopressor 12.5 mg twice daily.  HR controlled             7/19 HR controlled- con't regimen  LOS: 5 days A FACE TO FACE EVALUATION WAS PERFORMED  Clide Deutscher Marleena Shubert 09/06/2020, 12:34 PM

## 2020-09-06 NOTE — Progress Notes (Signed)
Physical Therapy Session Note  Patient Details  Name: Patrick [name] MRN: [id number] Date of Birth: April 11, 1965  Today's Date: 09/06/2020 PT Individual Time: [id number] and 1400-1440  PT Individual Time Calculation (min): 54 min and 40 min  Short Term Goals: Week 1:  PT Short Term Goal 1 (Week 1): pt will transfer sit<>stand with LRAD and CGA PT Short Term Goal 2 (Week 1): pt will transfer bed<>chair with LRAD and CGA PT Short Term Goal 3 (Week 1): pt will initate gait training  Skilled Therapeutic Interventions/Progress Updates:   Treatment Session 1 Received pt sitting in WC, pt agreeable to PT treatment, and denied any pain at start of session but reported mild increase in R groin pain with exercises but declined any pain interventions. Session with emphasis on functional mobility/transfers, generalized strengthening, dynamic standing balance/coordination, ambulation, and improved activity tolerance. Pt transported to/from room on 5C in [facility] total A for time management and energy conservation purposes. Re-wrapped towel and coband around amputee support pad to promote R knee extension. Sit<>stand with RW and CGA and ambulated 6f with RW and CGA. Pt demonstrated decreased LLE foot clearance with increased fatigue. Pt transferred sit<>supine and supine<>sitting EOM with supervision. Pt performed the following exercises on mat with supervision and verbal cues for technique: -supine SLR 2x12 on R residual limb -supine hip abduction 2x12 on R residual limb -supine single leg bridges with R residual limb supported on bolster 2x8 (pt with increased difficulty clearing buttocks due to glute weakness) -L sidelying R hip abduction 2x10 (cues for hip/knee extension for optimal positioning) -prone R hip extension 2x8 -prone hamstring curls 2x10 bilaterally -prone on elbows<>prone on extended elbows x10 with 5 second hold Educated pt on importance of spending time in prone daily to avoid hip flexion  contractures and prepare limb for prosthetic fitting; pt verbalized understanding. MD present for morning rounds. Pt then transferred mat<>WC via squat<>pivot with CGA with cues to avoid "plopping" and able to don legrests with supervision and increased time. Concluded session with pt sitting in WC, needs within reach, and chair pad alarm on. Provided pt with fresh drink and newspaper.   Treatment Session 2 Received pt sitting in WC, pt agreeable to PT treatment, and denied any pain during session. Session with emphasis on functional mobility/transfers, generalized strengthening, dynamic standing balance/coordination, and improved activity tolerance. Pt updated on D/C date and reports wishing he could leave this weekend but understands impairments he needs to continue working on in therapy. [facility] and [facility]. Pt able to set up transfer to mat with min cues (particularly for getting WC closer to mat) and transferred squat<>pivot to/from with CGA. Required assist from therapist to stabilize WC as it began sliding mid-transfer. Worked on blocked practice sit<>stands with RW and CGA x10 reps with therapist stabilizing RW; stopped due to fatigue and increased pain in L knee. Worked on dynamic sitting balance, UE strengthening/ROM, and core strengthening batting beach ball in various directions/angles to promote reaching outside BOS with 4lb dowel for 1 minute x 3 trials with supervision. Pt transferred sit<>stand with RW and CGA x 3 additional trials and performed the following exercises standing with BUE support and CGA: -R hip flexion 4x10 -R hip abduction 3x10 -R hip extension 4x10 Concluded session with pt sitting in WC, needs within reach, and chair pad alarm on. Provided pt with fresh drinks.   Therapy Documentation Precautions:  Precautions Precautions: Fall Restrictions  Weight Bearing Restrictions: Yes RLE Weight  Bearing: Non weight bearing  Therapy/Group: Individual Therapy Hyde Percle PT, DPT   09/06/2020, 7:12 AM

## 2020-09-07 LAB — GLUCOSE, CAPILLARY
Glucose-Capillary: 130 mg/dL — ABNORMAL HIGH (ref 70–99)
Glucose-Capillary: 111 mg/dL — ABNORMAL HIGH (ref 70–99)
Glucose-Capillary: 125 mg/dL — ABNORMAL HIGH (ref 70–99)
Glucose-Capillary: 203 mg/dL — ABNORMAL HIGH (ref 70–99)

## 2020-09-07 NOTE — Progress Notes (Signed)
Occupational Therapy Session Note  Patient Details  Name: Patrick [name] MRN: [id number] Date of Birth: 08-07-1965  Today's Date: 09/07/2020 OT Individual Time: [id number] OT Individual Time Calculation (min): 60 min    Short Term Goals: Week 1:  OT Short Term Goal 1 (Week 1): Pt will complete LB dressing at sit > stand level with CGA OT Short Term Goal 2 (Week 1): Pt will complete toilet transfer with supervision OT Short Term Goal 3 (Week 1): Pt will complete bathing at sit > stand level with CGA  Skilled Therapeutic Interventions/Progress Updates:  Pt greeted in w/c agreeable to OT intervention. Session focus on functional mobility in kitchen, dynamic standing balance/ tolerance, transfer training, BUE therex and functional mobility. Pt required overall CGA for sit<>stands with RW and functional mobility >40 ft with RW. Pt completed functional mobility in kitchen with simulated IADLs with pt needing overall CGA with Rw.  Problem solved through reaching items in kitchen and general safety in kitchen. Pt completed seated therex with 5lb dowel rod with pt completing x20 reps of chest press, overhead shoulder press and shoulder rows forward<>backward. Pt also able to use 5 lb dowel rod to tap beach ball back worth with OTA from w/c level.pt completed multiple stand pivot and lateral scoot transfers from w/c<>EOM with CGA. Pt transported back to room with total A for time mgmt with pt left seated in w/c with alarm pad activated and all needs within reach.   Therapy Documentation Precautions:  Precautions Precautions: Fall Restrictions Weight Bearing Restrictions: Yes RLE Weight Bearing: Non weight bearing  Pain: Pt reports mild pain in L knee, provided seated rest breaks and repositioning as pain mgmt strategy.    Therapy/Group: Individual Therapy  [name] 09/07/2020, 4:31 PM

## 2020-09-07 NOTE — Progress Notes (Signed)
Occupational Therapy Session Note  Patient Details  Name: Patrick [name] MRN: [id number] Date of Birth: [birth year]  Today's Date: 09/07/2020 OT Individual Time: 1300-1330 OT Individual Time Calculation (min): 30 min    Short Term Goals: Week 1:  OT Short Term Goal 1 (Week 1): Pt will complete LB dressing at sit > stand level with CGA OT Short Term Goal 2 (Week 1): Pt will complete toilet transfer with supervision OT Short Term Goal 3 (Week 1): Pt will complete bathing at sit > stand level with CGA  Skilled Therapeutic Interventions/Progress Updates:  Pt received seated in w/c agreeable to OT intervention. Session focus on dynamic standing balance, BUE therex and education related to energy conservation. Pt required CGA overall for sit<>stand throughout session with RW. Pt able to engage in dynamic reaching task with BUEs, pt additionally able to toss ball back and forth in standing with LUE and RUE stabilized on RW. Pt completed seated therex with level 1 theraband with pt completing x20 reps of bicep curls, shoulder horizontal ABD/ADD, punches, tricep rows and shoulder flexion. Education and handout provided on energy conservation strategies for home with pt receptive and appreciative of education. Pt left seated in w/c with alarm pad activated and all needs within reach.   Therapy Documentation Precautions:  Precautions Precautions: Fall Restrictions Weight Bearing Restrictions: Yes RLE Weight Bearing: Non weight bearing  Pain: Pt reports no pain during session.    Therapy/Group: Individual Therapy  [name] 09/07/2020, 1:44 PM

## 2020-09-07 NOTE — Progress Notes (Signed)
PROGRESS NOTE   Subjective/Complaints:  Pt wants to sleep- Bowels OK- LBM this AM.  Needs to leave earlier than stated d/c date- which is next Tuesday- will let Dr Ranell Patrick know he wants Friday or Saturday.   No other issues.   ROS:  Pt denies SOB, abd pain, CP, N/V/C/D, and vision changes   Objective:   No results found. Recent Labs    09/05/20 0630  WBC 10.8*  HGB 8.4*  HCT 27.9*  PLT 638*   Recent Labs    09/05/20 0630  NA 139  K 4.3  CL 108  CO2 22  GLUCOSE 100*  BUN 22*  CREATININE 1.67*  CALCIUM 8.9    Intake/Output Summary (Last 24 hours) at 09/07/2020 1604 Last data filed at 09/07/2020 0500 Gross per 24 hour  Intake 360 ml  Output 1800 ml  Net -1440 ml        Physical Exam: Vital Signs Blood pressure (!) 161/95, pulse 89, temperature 99.2 F (37.3 C), resp. rate 19, height '6\' 4"'$  (1.93 m), weight (!) 144 kg, SpO2 99 %.   General: awake, alert, appropriate, initially sleeping, but woke for interview; NAD HENT: conjugate gaze; oropharynx moist CV: regular rate; no JVD Pulmonary: CTA B/L; no W/R/R- good air movement GI: soft, NT, ND, (+)BS- hypoactive- just had BM Psychiatric: appropriate- interactive, but sleepy Neurological: alert  Skin: In limb guard- right bka is less bulbous/swollen, "T" incision with staples, minimal drainage. Shrinker in place this AM  Macerated area in abdominal/waist fold dryer. Neuro: Pt is cognitively appropriate with normal insight, memory, and awareness. Cranial nerves 2-12 are intact. Sensory exam is normal. Reflexes are 2+ in all 4's. Fine motor coordination is intact. No tremors. Motor function is grossly 5/5 UE. RLE--gravity strength. Musculoskeletal: Right BK residual limb tender with palpation and ROM. Able to fully extend at the knee, flexion still limited d/t swelling. Right hip flexors sore, likely rectus femoris   Assessment/Plan: 1. Functional  deficits which require 3+ hours per day of interdisciplinary therapy in a comprehensive inpatient rehab setting. Physiatrist is providing close team supervision and 24 hour management of active medical problems listed below. Physiatrist and rehab team continue to assess barriers to discharge/monitor patient progress toward functional and medical goals  Care Tool:  Bathing    Body parts bathed by patient: Right arm, Left arm, Chest, Abdomen, Front perineal area, Right upper leg, Left upper leg, Face   Body parts bathed by helper: Buttocks, Left lower leg Body parts n/a: Right lower leg   Bathing assist Assist Level: Minimal Assistance - Patient > 75%     Upper Body Dressing/Undressing Upper body dressing   What is the patient wearing?: Pull over shirt    Upper body assist Assist Level: Set up assist    Lower Body Dressing/Undressing Lower body dressing      What is the patient wearing?: Pants     Lower body assist Assist for lower body dressing: Contact Guard/Touching assist     Toileting Toileting    Toileting assist Assist for toileting: Contact Guard/Touching assist     Transfers Chair/bed transfer  Transfers assist  Chair/bed transfer activity did not occur:  Safety/medical concerns  Chair/bed transfer assist level: Contact Guard/Touching assist     Locomotion Ambulation   Ambulation assist   Ambulation activity did not occur: Safety/medical concerns  Assist level: Contact Guard/Touching assist Assistive device: Walker-rolling Max distance: 74f   Walk 10 feet activity   Assist  Walk 10 feet activity did not occur: Safety/medical concerns (weakness, fatigue, decreasd balance/postural control)  Assist level: Contact Guard/Touching assist Assistive device: Walker-rolling   Walk 50 feet activity   Assist Walk 50 feet with 2 turns activity did not occur: Safety/medical concerns (weakness, fatigue, decreasd balance/postural control)  Assist level:  Contact Guard/Touching assist Assistive device: Walker-rolling    Walk 150 feet activity   Assist Walk 150 feet activity did not occur: Safety/medical concerns (weakness, fatigue, decreasd balance/postural control)         Walk 10 feet on uneven surface  activity   Assist Walk 10 feet on uneven surfaces activity did not occur: Safety/medical concerns (weakness, fatigue, decreasd balance/postural control)   Assist level: Minimal Assistance - Patient > 75% Assistive device: WAeronautical engineerWill patient use wheelchair at discharge?: Yes Type of Wheelchair: Manual    Wheelchair assist level: Supervision/Verbal cueing Max wheelchair distance: 1510f   Wheelchair 50 feet with 2 turns activity    Assist        Assist Level: Supervision/Verbal cueing   Wheelchair 150 feet activity     Assist      Assist Level: Supervision/Verbal cueing   Blood pressure (!) 161/95, pulse 89, temperature 99.2 F (37.3 C), resp. rate 19, height '6\' 4"'$  (1.93 m), weight (!) 144 kg, SpO2 99 %.    Medical Problem List and Plan: 1.   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  Impaired mobility, ambulating 40 feet: continue Lovenox             -antiplatelet therapy: N/A 3. Pain Management: Neurontin 300 mg 3 times daily, oxycodone as needed 7/15- will add prn tizanidine for muscle spasm -discussed some stretches for right hip. Needs to be careful in short term to avoid overexerting with right hip flexion. This is more of a hip flexor strain then a groin strain,  likely rectus femoris, possibly iliopsoas.   -prn heat -7/20- pt reports pain is slightly better today- con't regimen.  4. Mood: Provide emotional support             -antipsychotic agents: N/A 5. Neuropsych: This patient is capable of making decisions on his own behalf. 6. Skin/Wound Care: Routine skin checks 7. Fluids/Electrolytes/Nutrition: Routine in and outs with follow-up chemistries 8.  Acute on chronic anemia.  Patient had received total 4 units packed red blood cells.  Continue iron supplement.  Follow-up hgb 8.4 7/18 9.  Diabetes mellitus with peripheral neuropathy.  Hemoglobin A1c 7.8.  Lantus insulin 25 units twice daily,  NovoLog 4 units 3 times daily with meals.    CBG (last 3)  Recent Labs (last 2 labs)        Recent Labs    09/04/20 1657 09/04/20 2111 09/05/20 0608  GLUCAP 185* 146* 100*     7/20- I am not sure what pt's home regimen was- since on insulin, just verify- BG's fair- con't regimen 10.  Right elbow gout.  Uric acid 11.2.  Zyloprim 100 mg twice daily.  Course of colchicine has been completed.             -7/16 improved after 2 days indocin 11.  ID/sepsis.  Blood cultures 1/4 grew staph.  Completing course of amoxicillin after initially being maintained on cefepime             7/15 wbc's down to 10.2 12.  CKD stage III.   7/15 BUN/Cr 20/1.6 Cr. Slightly elevated. Only received 2 days nsaid. Educated regarding staying well hydrated. 7/20- labs in AM  13.  Recent history C. difficile 06/2020.  Completing course of p.o. vancomycin.  Contact precautions discontinued 14.VRE/UTI.Contact precautions and complete course of Zyvox 14.  Obesity.  BMI 38.27.  Dietary follow-up 15.  Tachycardia.  Continue Lopressor 12.5 mg twice daily.  HR controlled  7/20- HR 89-low 90s- con't regimen  LOS: 6 days A FACE TO FACE EVALUATION WAS PERFORMED  Karen Huhta 09/07/2020, 4:04 PM

## 2020-09-07 NOTE — Progress Notes (Signed)
Patient ID: Patrick [name], male   DOB: 1965/05/27, 55 y.o.   MRN: [id number] Met with pt to discuss has a rolling walker from 05/2020. He reports it is broken and unsure where it is. Reached out to [employer] and they will not replace since has been more than 30 days. Have let pt know, if he wants one would need to private pay.

## 2020-09-07 NOTE — Progress Notes (Signed)
Physical Therapy Session Note  Patient Details  Name: Patrick [name] MRN: [id number] Date of Birth: [birth year]  Today's Date: 09/07/2020 PT Individual Time: 1128-1208 PT Individual Time Calculation (min): 40 min   Short Term Goals: Week 1:  PT Short Term Goal 1 (Week 1): pt will transfer sit<>stand with LRAD and CGA PT Short Term Goal 2 (Week 1): pt will transfer bed<>chair with LRAD and CGA PT Short Term Goal 3 (Week 1): pt will initate gait training  Skilled Therapeutic Interventions/Progress Updates:     Patient in w/c in the room upon PT arrival. Patient alert and agreeable to PT session. Patient denied pain during session, reported 7.5/10 fatigue from gait training in earlier PT session.   Spent majority of session discussing d/c planning and setting realistic goals and expectations. Patient reports that he will be staying with a friend and is not yet sure if he will have to stay upstairs or downstairs, discussed and demonstrated shower chair technique with a single rail and bumping up the steps vs hopping up the steps, which patient reports his L knee would not tolerate. 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 He also reports that he will be using a scooter when at work. Educated patient on risks of progressing to work and community mobility quickly including risk of infection, decreased activity tolerance for 8 hour work days and car transfers following work. Need for changes in positioning, pressure relief, use of public bathrooms, and challenges with use of a scooter vs w/c. Patient attentive and stated understanding. Patient was agreeable to asking about starting with part-time hours and progressing to full-time as tolerated for energy conservation and improved success with this transition. Also, encouraged the patient to asked the MD about return to work and  educated on the benefits of allowing his body to recover prior to returning to work. Patient in agreement.   Patient also reported that he is performing toilet transfers in the room at night independently, patient is not cleared for this and educated on risk of falls and that patient needs to call for assistance at this time for safety, patient stated understanding. Lead PT and RN made aware at end of session.   Patient propelled the w/c >200 feet x2, patient's room<>main gym, using B upper extremities with supervision-mod I. Provided cues and min A for transitioning on/off elevator.   Patient in w/c in the room at end of session with breaks locked, bed alarm set, and all needs within reach.   Therapy Documentation Precautions:  Precautions Precautions: Fall Restrictions Weight Bearing Restrictions: Yes RLE Weight Bearing: Non weight bearing    Therapy/Group: Individual Therapy  Zakhia Seres L Zamantha Strebel PT, DPT  09/07/2020, 4:33 PM

## 2020-09-07 NOTE — Progress Notes (Signed)
Physical Therapy Session Note  Patient Details  Name: Patrick [name] MRN: [id number] Date of Birth: 1966-02-06  Today's Date: 09/07/2020 PT Individual Time: 1000-1056 PT Individual Time Calculation (min): 56 min   Short Term Goals: Week 1:  PT Short Term Goal 1 (Week 1): pt will transfer sit<>stand with LRAD and CGA PT Short Term Goal 2 (Week 1): pt will transfer bed<>chair with LRAD and CGA PT Short Term Goal 3 (Week 1): pt will initate gait training  Skilled Therapeutic Interventions/Progress Updates:   Received pt supine in bed asleep, pt easily aroused and agreeable to PT treatment. Pt reported having an upset stomach but declined any additional interventions. Session with emphasis on functional mobility/transfers, generalized strengthening, dynamic standing balance/coordination, gait training, toileting, and improved activity tolerance. Transferred semi-reclined<>sitting EOB with supervision and donned pants with supervision via lateral leans. Doffed dirty shirt and donned clean one with supervision. Pt transferred bed<>WC via squat<>pivot with CGA and performed WC mobility 121f x 1 and 754fx 1 using BUE and supervision to 4W therapy gym. Pt then reported urge to have BM and transferred WC<>regular toilet stand<>pivot with RW and CGA. Pt required max A for clothing management and able to have BM and perform peri-care with supervision. Sit<>stand from regular toilet with heavy min A and use of grab bars and required max A to pull underwear/pants over hips. Pt sat in WCLindsaynd washed hands with set up assist. Sit<>stand with RW and CGA and ambulated 6465fith RW and CGA using  "hop to" pattern - decreased L foot clearance with fatigue. Educated pt on energy conservation and importance of avoiding too much "hopping" due to repetitive strain on L knee; pt verbalized understanding. Pt [facility] and performed the following exercises [facility] and verbal  cues for technique: -hip flexion 2x20 bilaterally -knee extension 2x20 bilaterally  -hip adduction pillow squeezes 10x10 second isometric hold Concluded session with pt sitting in WC, needs within reach, and chair pad alarm on.   Therapy Documentation Precautions:  Precautions Precautions: Fall Restrictions Weight Bearing Restrictions: Yes RLE Weight Bearing: Non weight bearing  Therapy/Group: Individual Therapy AnnJahrel Lacomb, DPT   09/07/2020, 7:28 AM

## 2020-09-08 ENCOUNTER — Other Ambulatory Visit (HOSPITAL_COMMUNITY): Payer: Self-pay

## 2020-09-08 LAB — BASIC METABOLIC PANEL
Anion gap: 9 (ref 5–15)
BUN: 25 mg/dL — ABNORMAL HIGH (ref 6–20)
CO2: 24 mmol/L (ref 22–32)
Calcium: 8.7 mg/dL — ABNORMAL LOW (ref 8.9–10.3)
Chloride: 105 mmol/L (ref 98–111)
Creatinine, Ser: 1.65 mg/dL — ABNORMAL HIGH (ref 0.61–1.24)
GFR, Estimated: 49 mL/min — ABNORMAL LOW (ref >60)
Glucose, Bld: 118 mg/dL — ABNORMAL HIGH (ref 70–99)
Potassium: 4.7 mmol/L (ref 3.5–5.1)
Sodium: 138 mmol/L (ref 135–145)

## 2020-09-08 LAB — GLUCOSE, CAPILLARY
Glucose-Capillary: 120 mg/dL — ABNORMAL HIGH (ref 70–99)
Glucose-Capillary: 106 mg/dL — ABNORMAL HIGH (ref 70–99)
Glucose-Capillary: 147 mg/dL — ABNORMAL HIGH (ref 70–99)
Glucose-Capillary: 244 mg/dL — ABNORMAL HIGH (ref 70–99)

## 2020-09-08 MED ORDER — LANTUS SOLOSTAR 100 UNIT/ML ~~LOC~~ SOPN
25.0000 [IU] | PEN_INJECTOR | Freq: Two times a day (BID) | SUBCUTANEOUS | 11 refills | Status: DC
  Filled 2020-09-08: qty 15, 30d supply, fill #0

## 2020-09-08 MED ORDER — INSULIN PEN NEEDLE 32G X 4 MM MISC
0 refills | Status: DC
  Filled 2020-09-08: qty 100, 25d supply, fill #0

## 2020-09-08 MED ORDER — FERROUS SULFATE 325 (65 FE) MG PO TABS
325.0000 mg | ORAL_TABLET | Freq: Three times a day (TID) | ORAL | 3 refills | Status: DC
  Filled 2020-09-08: qty 90, 30d supply, fill #0

## 2020-09-08 MED ORDER — NOVOLOG FLEXPEN 100 UNIT/ML ~~LOC~~ SOPN
4.0000 [IU] | PEN_INJECTOR | Freq: Three times a day (TID) | SUBCUTANEOUS | 11 refills | Status: DC
  Filled 2020-09-08: qty 3, 25d supply, fill #0

## 2020-09-08 MED ORDER — ALLOPURINOL 100 MG PO TABS
100.0000 mg | ORAL_TABLET | Freq: Two times a day (BID) | ORAL | 3 refills | Status: DC
  Filled 2020-09-08: qty 60, 30d supply, fill #0

## 2020-09-08 MED ORDER — OXYCODONE HCL 10 MG PO TABS
10.0000 mg | ORAL_TABLET | ORAL | 0 refills | Status: DC | PRN
  Filled 2020-09-08: qty 30, 4d supply, fill #0

## 2020-09-08 MED ORDER — GABAPENTIN 300 MG PO CAPS
300.0000 mg | ORAL_CAPSULE | Freq: Three times a day (TID) | ORAL | 0 refills | Status: DC
  Filled 2020-09-08: qty 90, 30d supply, fill #0

## 2020-09-08 MED ORDER — POLYETHYLENE GLYCOL 3350 17 G PO PACK: 17.0000 g | PACK | Freq: Every day | ORAL | 0 refills | Status: DC

## 2020-09-08 MED ORDER — ASCORBIC ACID 1000 MG PO TABS
1000.0000 mg | ORAL_TABLET | Freq: Every day | ORAL | 0 refills | Status: DC
  Filled 2020-09-08: qty 30, 30d supply, fill #0

## 2020-09-08 MED ORDER — METOPROLOL TARTRATE 25 MG PO TABS
12.5000 mg | ORAL_TABLET | Freq: Two times a day (BID) | ORAL | 0 refills | Status: DC
  Filled 2020-09-08: qty 30, 30d supply, fill #0

## 2020-09-08 MED ORDER — ZINC SULFATE 220 (50 ZN) MG PO TABS
220.0000 mg | ORAL_TABLET | Freq: Every day | ORAL | 0 refills | Status: DC
  Filled 2020-09-08: qty 30, 30d supply, fill #0

## 2020-09-08 NOTE — Progress Notes (Signed)
Patient ID: Patrick [name], male   DOB: 12/31/1965, 54 y.o.   MRN: [id number]  Met with patient in his room to explain discharge procedures. Explained that PA will go over discharge instructions Friday, 09/09/2020, including medications. Discharge medications would be faxed to his pharmacy of choice. Day of discharge, Saturday, 09/10/2020, MD would make rounds, RN would give morning medications, and when ride arrived staff could take him and his belongings down for discharge. Patient verbalized understanding.   , RN3, BSN, CBIS, CRRN, WTA Care Coordinator, Inpatient Rehabilitation Office [phone] Cell [phone]  

## 2020-09-08 NOTE — Progress Notes (Signed)
Occupational Therapy Session Note  Patient Details  Name: Patrick [name] MRN: [id number] Date of Birth: 25-Nov-1965  Today's Date: 09/08/2020 OT Individual Time: [id number]   &   1500-1555 OT Individual Time Calculation (min): 60 min    &   55 min   Short Term Goals: Week 1:  OT Short Term Goal 1 (Week 1): Pt will complete LB dressing at sit > stand level with CGA OT Short Term Goal 2 (Week 1): Pt will complete toilet transfer with supervision OT Short Term Goal 3 (Week 1): Pt will complete bathing at sit > stand level with CGA  Skilled Therapeutic Interventions/Progress Updates:    AM session:   Patient seated in w/c, states that he was able to complete dressing and sponge bathing earlier today on his own w/c level.  He declined shower or other adl activities at this time - reviewed process for shower (covering residual limb until healed, DME, skin checks, and with prosthesis)  reviewed toilet DME options.  Verbal review of HM w/c and walker level - he declined practice at this time.  Reviewed and practiced weight shifts, edema management, skin care, skin inspection, pressure awareness and relief.  Sit to stand from w/c with CS - he completed standing right ROM activity with CS.  He remained seated in w/c at close of session, seat alarm set and call bell / tray table in reach.     PM session:   patient seated in w/c, ready for OT session.   He denies pain.  Reviewed heights of various surfaces to prepare for ease of transfer in home environment.  To gym via w/c.  Sit pivot transfer w/c to/from mat with supervision - completed upper body conditioning exercises, core mobility and strengthening with good tolerance.  He remained seated in w/c upon return to room, call bell and tray table in reach.       Therapy Documentation Precautions:  Precautions Precautions: Fall Restrictions Weight Bearing Restrictions: Yes RLE Weight Bearing: Non weight bearing   Therapy/Group: Individual  Therapy  Carlos Levering 09/08/2020, 8:03 AM

## 2020-09-08 NOTE — Progress Notes (Addendum)
Physical Therapy Discharge Summary  Patient Details  Name: Patrick [name] MRN: [id number] Date of Birth: [birth year]   Patient has met 7 of 9 long term goals due to improved activity tolerance, improved balance, improved postural control, increased strength, decreased pain, improved awareness, and improved coordination. Patient to discharge at a wheelchair level supervision/mod I. Pt's family/friends did not attend formal family education session as pt verbalizes confidence with all tasks for discharge home.   Reasons goals not met: Pt did not meet bed<>chair goal of mod I as pt currently requires close supervision due to balance deficits, generalized weakness, and deconditioning. Pt did not meet car transfer goal of supervision as pt currently requires CGA. Pt did not meet these goals due to personal request to move up discharge date.   Recommendation:  Patient will benefit from ongoing skilled PT services in home health setting to continue to advance safe functional mobility, address ongoing impairments in transfers, generalized strengthening, dynamic standing balance/coordination, gait training, limb loss education, endurance, and to minimize fall risk.  Equipment: Recommended 20x18 manual WC with R amputee support pad and standard legrest and bariatric RW. Pt has scooter at home that he plans on using.   Reasons for discharge: treatment goals met and discharge from hospital  Patient/family agrees with progress made and goals achieved: Yes  PT Discharge Precautions/Restrictions Precautions Precautions: Fall Restrictions Weight Bearing Restrictions: Yes RLE Weight Bearing: Non weight bearing Cognition Overall Cognitive Status: Within Functional Limits for tasks assessed Arousal/Alertness: Awake/alert Orientation Level: Oriented X4 Memory: Appears intact Awareness: Appears intact Problem Solving: Appears intact Safety/Judgment: Appears intact Comments: mild decreased insight into  deficits Sensation Sensation Light Touch: Impaired by gross assessment Proprioception: Impaired by gross assessment Additional Comments: absent sensation along incision of R residual limb. Absent sensation along L medial/lateral ankle and great toe du to neuropathy Coordination Gross Motor Movements are Fluid and Coordinated: No Fine Motor Movements are Fluid and Coordinated: Yes Coordination and Movement Description: grossly uncoordinated due to generalized weakness and deconditioning, decreased balance/postural control, and pain Finger Nose Finger Test: [facility] bilaterally Heel Shin Test: decreased ROM bilaterally; improved ROM since eval Motor  Motor Motor: Abnormal postural alignment and control Motor - Skilled Clinical Observations: grossly uncoordinated due to generalized weakness and deconditioning, decreased balance/postural control, and fatigue  Mobility Bed Mobility Bed Mobility: Rolling Right;Rolling Left;Sit to Supine;Supine to Sit Rolling Right: Independent with assistive device Rolling Left: Independent with assistive device Supine to Sit: Independent with assistive device Sit to Supine: Independent with assistive device Transfers Transfers: Sit to Stand;Stand to Sit;Stand Pivot Transfers Sit to Stand: Supervision/Verbal cueing Stand to Sit: Supervision/Verbal cueing Stand Pivot Transfers: Supervision/Verbal cueing Transfer (Assistive device): Rolling walker Locomotion  Gait Ambulation: Yes Gait Assistance: Supervision/Verbal cueing Gait Distance (Feet): 34 Feet Assistive device: Rolling walker Gait Assistance Details: Verbal cues for precautions/safety;Verbal cues for safe use of DME/AE;Verbal cues for technique Gait Assistance Details: verbal cues to increase LLE foot clearance and for energy conservation strategies to avoid excessive "hopping" on L knee Gait Gait: Yes Gait Pattern: Impaired Gait Pattern: Decreased step length - left;Decreased stride length;Poor  foot clearance - left;Decreased trunk rotation Gait velocity: decreased Stairs / Additional Locomotion Stairs: No Ramp: Minimal Assistance - Patient >75% (RW) Product manager Mobility: Yes Wheelchair Assistance: Independent with Camera operator: Both upper extremities Wheelchair Parts Management: Independent Distance: 134f  Trunk/Postural Assessment  Cervical Assessment Cervical Assessment: Within Functional Limits Thoracic Assessment Thoracic Assessment: [facility] to [facility](rounded shoulders) Lumbar Assessment Lumbar  Assessment: Exceptions to [facility] (posterior pelvic tilt) Postural Control Postural Control: Deficits on evaluation  Balance Balance Balance Assessed: Yes Static Sitting Balance Static Sitting - Balance Support: Bilateral upper extremity supported;Feet supported Static Sitting - Level of Assistance: 6: Modified independent (Device/Increase time) Dynamic Sitting Balance Dynamic Sitting - Balance Support: Feet supported;Bilateral upper extremity supported Dynamic Sitting - Level of Assistance: 6: Modified independent (Device/Increase time) Static Standing Balance Static Standing - Balance Support: Bilateral upper extremity supported (RW) Static Standing - Level of Assistance: 5: Stand by assistance (supervision) Dynamic Standing Balance Dynamic Standing - Balance Support: Bilateral upper extremity supported (RW) Dynamic Standing - Level of Assistance: 5: Stand by assistance (supervision) Extremity Assessment  RLE Assessment RLE Assessment: Exceptions to [facility] Strength Comments: grossly generalized to 4/5 in available planes LLE Assessment LLE Assessment: Exceptions to [facility] Strength Comments: grossly generalized to 4/5 (hip flexion and DF 4-/5)  Alfonse Alpers PT, DPT  Cruzita Lederer Kerem Gilmer, PT, DPT  09/09/2020, 4:08 PM

## 2020-09-08 NOTE — Progress Notes (Signed)
PROGRESS NOTE   Subjective/Complaints: Would like to d/c Saturday in order to start working on Tuesday. Barriers discussed with patient by therapy and SW. He is progressing really well with therapy! Denies pain  ROS:  Pt denies SOB, abd pain, CP, N/V/C/D, and vision changes, pain   Objective:   No results found. No results for input(s): WBC, HGB, HCT, PLT in the last 72 hours.  Recent Labs    09/08/20 0723  NA 138  K 4.7  CL 105  CO2 24  GLUCOSE 118*  BUN 25*  CREATININE 1.65*  CALCIUM 8.7*    Intake/Output Summary (Last 24 hours) at 09/08/2020 1236 Last data filed at 09/08/2020 1153 Gross per 24 hour  Intake 840 ml  Output 2800 ml  Net -1960 ml        Physical Exam: Vital Signs Blood pressure (!) 151/96, pulse 86, temperature 98.7 F (37.1 C), temperature source Oral, resp. rate 18, height '6\' 4"'$  (1.93 m), weight (!) 144 kg, SpO2 96 %. Gen: no distress, normal appearing HEENT: oral mucosa pink and moist, NCAT Cardio: Reg rate Chest: normal effort, normal rate of breathing Abd: soft, non-distended Ext: no edema Psych: pleasant, normal affect  Skin: In limb guard- right bka is less bulbous/swollen, "T" incision with staples, minimal drainage. Shrinker in place this AM  Macerated area in abdominal/waist fold dryer. Neuro: Pt is cognitively appropriate with normal insight, memory, and awareness. Cranial nerves 2-12 are intact. Sensory exam is normal. Reflexes are 2+ in all 4's. Fine motor coordination is intact. No tremors. Motor function is grossly 5/5 UE. RLE--gravity strength. Musculoskeletal: Right BK residual limb tender with palpation and ROM. Able to fully extend at the knee, flexion still limited d/t swelling. Right hip flexors sore, likely rectus femoris   Assessment/Plan: 1. Functional deficits which require 3+ hours per day of interdisciplinary therapy in a comprehensive inpatient rehab  setting. Physiatrist is providing close team supervision and 24 hour management of active medical problems listed below. Physiatrist and rehab team continue to assess barriers to discharge/monitor patient progress toward functional and medical goals  Care Tool:  Bathing    Body parts bathed by patient: Right arm, Left arm, Chest, Abdomen, Front perineal area, Right upper leg, Left upper leg, Face   Body parts bathed by helper: Buttocks, Left lower leg Body parts n/a: Right lower leg   Bathing assist Assist Level: Minimal Assistance - Patient > 75%     Upper Body Dressing/Undressing Upper body dressing   What is the patient wearing?: Pull over shirt    Upper body assist Assist Level: Set up assist    Lower Body Dressing/Undressing Lower body dressing      What is the patient wearing?: Pants     Lower body assist Assist for lower body dressing: Contact Guard/Touching assist     Toileting Toileting    Toileting assist Assist for toileting: Contact Guard/Touching assist     Transfers Chair/bed transfer  Transfers assist  Chair/bed transfer activity did not occur: Safety/medical concerns  Chair/bed transfer assist level: Contact Guard/Touching assist     Locomotion Ambulation   Ambulation assist   Ambulation activity did not occur: Safety/medical  concerns  Assist level: Supervision/Verbal cueing Assistive device: Walker-rolling Max distance: 72f   Walk 10 feet activity   Assist  Walk 10 feet activity did not occur: Safety/medical concerns (weakness, fatigue, decreasd balance/postural control)  Assist level: Supervision/Verbal cueing Assistive device: Walker-rolling   Walk 50 feet activity   Assist Walk 50 feet with 2 turns activity did not occur: Safety/medical concerns (weakness, fatigue, decreasd balance/postural control)  Assist level: Contact Guard/Touching assist Assistive device: Walker-rolling    Walk 150 feet activity   Assist Walk  150 feet activity did not occur: Safety/medical concerns (weakness, fatigue, decreasd balance/postural control)         Walk 10 feet on uneven surface  activity   Assist Walk 10 feet on uneven surfaces activity did not occur: Safety/medical concerns (weakness, fatigue, decreasd balance/postural control)   Assist level: Minimal Assistance - Patient > 75% Assistive device: WAeronautical engineerWill patient use wheelchair at discharge?: Yes Type of Wheelchair: Manual    Wheelchair assist level: Supervision/Verbal cueing Max wheelchair distance: 1543f   Wheelchair 50 feet with 2 turns activity    Assist        Assist Level: Supervision/Verbal cueing   Wheelchair 150 feet activity     Assist      Assist Level: Supervision/Verbal cueing   Blood pressure (!) 151/96, pulse 86, temperature 98.7 F (37.1 C), temperature source Oral, resp. rate 18, height '6\' 4"'$  (1.93 m), weight (!) 144 kg, SpO2 96 %.    Medical Problem List and Plan: 1.   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  Impaired mobility, ambulating 40 feet: continue Lovenox             -antiplatelet therapy: N/A 3. Pain Management: Neurontin 300 mg 3 times daily, oxycodone as needed 7/15- will add prn tizanidine for muscle spasm -discussed some stretches for right hip. Needs to be careful in short term to avoid overexerting with right hip flexion. This is more of a hip flexor strain then a groin strain, likely rectus femoris, possibly iliopsoas.   -prn heat -7/21: well controlled, continue  above regimen.  4. Mood: Provide emotional support             -antipsychotic agents: N/A 5. Neuropsych: This patient is capable of making decisions on his own behalf. 6. Skin/Wound Care: Routine skin checks 7. Fluids/Electrolytes/Nutrition: Routine in and outs with follow-up chemistries 8.  Acute on chronic anemia.  Patient had received total 4 units packed red blood cells.  Continue iron supplement.  Follow-up hgb 8.4 7/18 9.  Diabetes mellitus with peripheral neuropathy.  Hemoglobin A1c 7.8.  Lantus insulin 25 units twice daily, NovoLog 4 units 3 times daily with meals.    CBG (last 3)  Recent Labs (last 2 labs)        Recent Labs  09/04/20 1657 09/04/20 2111 09/05/20 0608  GLUCAP 185* 146* 100*     7/21- fair control, continue current regimen.  10.  Right elbow gout.  Uric acid 11.2.  Zyloprim 100 mg twice daily.  Course of colchicine has been completed.             -7/16 improved after 2 days indocin 11.  ID/sepsis.  Blood cultures 1/4 grew staph.  Completing course of amoxicillin after initially being maintained on cefepime             7/15 wbc's down to 10.2 12.  CKD stage III.   7/15 BUN/Cr 20/1.6 Cr. Slightly elevated. Only received 2 days nsaid. Educated regarding staying well hydrated. 7/21: downtrending, monitor weekly.  13.  Recent history C. difficile 06/2020.  Completing course of p.o. vancomycin.  Contact precautions discontinued 14.VRE/UTI.Contact precautions and complete course of Zyvox 14.  Obesity.  BMI 38.27.  Dietary follow-up 15.  Tachycardia.  Continue Lopressor 12.5 mg twice daily.  HR controlled  7/20- HR 89-low 90s- con't regimen  LOS: 7 days A FACE TO FACE EVALUATION WAS PERFORMED  Patrick [name] Patrick [name] 09/08/2020, 12:36 PM

## 2020-09-08 NOTE — Progress Notes (Signed)
Patient ID: Patrick [name], male   DOB: 1966/02/07, 55 y.o.   MRN: [id number] pt asking to discharge sooner and has spoken with the MD regarding this. He plans to stay until Sat and discharge home to start his job on Tuesday. Aware he may not reach his goals and feels he can manage at home mod/I. Attempted to order the wheelchair but need for him to call to place card on fle in case doesn;t qualify for charity. Aware can not get walker switched out due to has been more than 30 days. He will call [employer] and to give information. Aware will not get follow up form here, will have home exercise program. Match put in for prescription assistance.

## 2020-09-08 NOTE — Discharge Summary (Signed)
Physician Discharge Summary  Patient ID: Patrick [name] MRN: [id number] DOB/AGE: Aug 26, 1965 55 y.o.  Admit date: 09/01/2020 Discharge date: 09/10/2020  Discharge Diagnoses:  Principal Problem:   Right below-knee amputee [facility]) Active Problems:   GOUT   CKD (chronic kidney disease) stage 3, GFR 30-59 ml/min (HCC)   ABLA (acute blood loss anemia) DVT prophylaxis Diabetes mellitus Recent history of C. Difficile VRE/UTI Obesity Tachycardia  Discharged Condition: Stable  Significant Diagnostic Studies: DG CHEST PORT 1 VIEW  Result Date: 08/31/2020 CLINICAL DATA:  Fever EXAM: PORTABLE CHEST 1 VIEW COMPARISON:  08/26/2020 FINDINGS: The heart size and mediastinal contours are stable. No focal airspace consolidation, pleural effusion, or pneumothorax. The visualized skeletal structures are unremarkable. IMPRESSION: No active disease. Electronically Signed   By: Davina Poke D.O.   On: 08/31/2020 12:40   DG Chest Port 1 View  Result Date: 08/26/2020 CLINICAL DATA:  Possible sepsis EXAM: PORTABLE CHEST 1 VIEW COMPARISON:  06/04/2020 FINDINGS: Mild cardiomegaly. No focal opacity or pleural effusion. No pneumothorax IMPRESSION: No active disease.  Mild cardiomegaly Electronically Signed   By: Donavan Foil M.D.   On: 08/26/2020 22:52   DG Foot Complete Right  Result Date: 08/26/2020 CLINICAL DATA:  Concern for foot infection EXAM: RIGHT FOOT COMPLETE - 3+ VIEW COMPARISON:  07/28/2020, 06/04/2020 FINDINGS: Patient is status post right fourth and fifth transmetatarsal amputation. Large irregular wound along the lateral aspect of the foot with gas in the soft tissues. Interval fragmentation and osseous destructive change of the fourth and fifth metatarsal remnant bases. Erosive change at the lateral cuboid. Increased periosteal reaction at the shaft of third metatarsal. Advanced degenerative changes of the first MTP joint. IMPRESSION: 1. Previous transmetatarsal amputation of the fourth and fifth  digits. 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 Gas in the soft tissues is presumably related to the large wound. 2. Increased periosteal reaction along the shaft of third metatarsal question reactive versus due to infection though no gross osseous destructive change Electronically Signed   By: Donavan Foil M.D.   On: 08/26/2020 22:55    Labs:  Basic Metabolic Panel: Recent Labs  Lab 09/02/20 0635 09/05/20 0630 09/08/20 0723  NA 136 139 138  K 4.1 4.3 4.7  CL 104 108 105  CO2 '22 22 24  '$ GLUCOSE 110* 100* 118*  BUN 20 22* 25*  CREATININE 1.60* 1.67* 1.65*  CALCIUM 8.8* 8.9 8.7*    CBC: Recent Labs  Lab 09/02/20 0635 09/05/20 0630  WBC 10.2 10.8*  NEUTROABS 6.3 7.2  HGB 7.7* 8.4*  HCT 24.9* 27.9*  MCV 83.8 84.3  PLT 559* 638*    CBG: Recent Labs  Lab 09/07/20 2104 09/08/20 0549 09/08/20 1132 09/08/20 1706 09/08/20 2108  GLUCAP 203* 120* 106* 147* 244*    Brief HPI:   Patrick [name] is a 55 y.o. right-handed male with history of diabetes mellitus gout chronic anemia obesity with BMI 38.27 CKD stage III hypertension peripheral vascular disease with right fourth and fifth ray amputations recent COVID 04/2019 as well as recent C. difficile treated with vancomycin.  Per chart review independent with assistive device prior to admission living with 9 relatives.  Presented 08/26/2020 with low-grade fever tachycardia as well as nonhealing right foot ulcer followed at [facility] wound care clinic and has undergone multiple debridements.  Blood cultures obtained 1/4  grew staph, urine culture 1000 colony of Enterococcus.  Prior urine cultures admission with VRE and started on Zyvox.  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 Louanne Skye.  His Hemovac was discontinued  08/29/2020.  Maintain on broad-spectrum antibiotics.  Hospital course acute blood loss anemia transfused 4 units packed red blood cells.  Patient developed right elbow gout pain with uric acid 11.2 placed on colchicine as well as Zyloprim.  He was cleared to begin Lovenox for DVT prophylaxis 08/28/2020.  In regards to patient's recent C. difficile cleared by infectious disease no longer needing contact precautions and completed course of vancomycin.  Therapy evaluations completed due to patient decreased functional mobility was admitted for a comprehensive rehab program.   Hospital Course: Patrick [name] was admitted to rehab 09/01/2020 for inpatient therapies to consist of PT, ST and OT at least three hours five days a week. Past admission physiatrist, therapy team and rehab RN have worked together to provide customized collaborative inpatient rehab.  Pertain to patient's right BKA complicated by sepsis he would follow-up orthopedic service Dr. Louanne Skye.  [employer] consulted for custom shrinker.  Pain managed with use of Neurontin as well as oxycodone as needed.  Acute blood loss anemia stable iron supplement as directed no bleeding episodes.  Blood sugars monitored hemoglobin A1c 7.8 insulin therapy as directed with diabetic teaching.  Patient was using subcutaneous Lovenox for DVT prophylaxis.  Right elbow gout uric acid 11.2 colchicine Zyloprim was added as well as Indocin with good results.  CKD stage III with latest creatinine 1.65.  Recent C. difficile completed course of vancomycin contact precautions discontinued.  VRE/UTI completing course of Zyvox remaining afebrile.  Patient was using low-dose beta-blocker for some tachycardia.  No chest pain or shortness of breath   Blood pressures were monitored on TID basis and soft and monitored  Diabetes has been monitored with ac/hs CBG checks and SSI was use prn for tighter BS control.    Rehab course: During patient's stay in rehab weekly team  conferences were held to monitor patient's progress, set goals and discuss barriers to discharge. At admission, patient required min assist lateral scoot transfers minimal assist sit to supine minimal assist lower body bathing minimal assist lower body bathing minimal assist lower body dressing  Physical exam.  Blood pressure 154/87 pulse 91 temperature 98.8 respirate 17 oxygen saturations 100% room air Constitutional.  No acute distress HEENT Head.  Normocephalic and atraumatic Eyes.  Pupils round and reactive to light no discharge.nystagmus Neck.  Supple nontender no JVD without thyromegaly Cardiac regular rate rhythm not extra sounds or murmur heard Abdomen.  Soft nontender positive bowel sounds not rebound Respiratory effort normal no respiratory distress without wheeze Musculoskeletal.  Normal range of motion Upper extremities 5/5 biceps triceps wrist extension grip finger abduction bilaterally Right lower extremity hip flexion 2/5 due to pain knee extension knee flexion 4/5.  Right BKA noted appropriately tender with knee immobilizer Left lower extremity 5/5 knee flexion extension dorsi plantarflexion  He/She  has had improvement in activity tolerance, balance, postural control as well as ability to compensate for deficits. He/She has had improvement in functional use RUE/LUE  and RLE/LLE as well as improvement in awareness.  Sessions focused on functional mobility dynamic standing balance transfer training.  Patient required overall contact-guard sit to stand rolling walker ambulating 40 feet rolling walker.  Educated patient all risk of safety.  Patient was attentive and understanding.  Performing toilet transfers in the room independently.  Full teaching completed plan discharged to home       Disposition: Discharged to home    Diet: Diabetic diet  Special Instructions: No driving smoking or alcohol  Medications at discharge. 1.  Tylenol as needed 2.  Zyloprim 100 mg p.o.  twice daily 3.  Vitamin C 1000 mg p.o. daily 4.  Colace 100 mg p.o. daily 5.  Ferrous sulfate 325 mg p.o. 3 times daily 6.  Neurontin 300 mg p.o. 3 times daily 7.  NovoLog 4 units 3 times daily with meals 8.  Lantus insulin 25 units twice daily 9.  Lopressor 12.5 mg p.o. twice daily 10.  MiraLAX daily hold for loose stools 11.  Zinc sulfate 220 mg p.o. daily 12.  Oxycodone 10 to 15 mg every 4 hours as needed pain   30-35 minutes were spent completing discharge summary and discharge planning  Discharge Instructions     Ambulatory referral to Physical Medicine Rehab   Complete by: As directed    Moderate complexity follow-up 1 to 2 weeks right BKA        Follow-up Information     [name], Clide Deutscher, MD Follow up.   Specialty: Physical Medicine and Rehabilitation Why: 12/19/20 please arrive at 2:00pm for 2:20pm appointment Contact information: [id number] [address] [place] [address] [phone]         Jessy Oto, MD Follow up.   Specialty: Orthopedic Surgery Why: Call for appointment Contact information: [place] [address] [phone]         [address] Follow up.   Specialty: Internal Medicine Contact information: [address] [place] [address] [phone]                 Signed: Cathlyn Parsons 09/09/2020, 5:53 AM

## 2020-09-08 NOTE — Progress Notes (Signed)
Physical Therapy Session Note  Patient Details  Name: Patrick [name] MRN: [id number] Date of Birth: [birth year]  Today's Date: 09/08/2020 PT Individual Time: [id number] and [id number] PT Individual Time Calculation (min): 53 min and 26 min  Short Term Goals: Week 1:  PT Short Term Goal 1 (Week 1): pt will transfer sit<>stand with LRAD and CGA PT Short Term Goal 2 (Week 1): pt will transfer bed<>chair with LRAD and CGA PT Short Term Goal 3 (Week 1): pt will initate gait training  Skilled Therapeutic Interventions/Progress Updates:   Treatment Session 1 Received pt sitting in WC brushing teeth, pt agreeable to PT treatment, and denied any pain during session. Session with emphasis on functional mobility/transfers, generalized strengthening, dynamic standing balance/coordination, gait training, simulated car transfers, and improved activity tolerance. Pt transported to/from room on 5C in [facility] total A for time management and energy conservation purposes. Pt performed ambulatory simulated car transfer with RW and CGA with increased time to get LEs into car. Pt then ambulated 53f on uneven surfaces (ramp) with RW and CGA (min A initially to overcome threshold) and demonstrating decreased L foot clearance. Sit<>stand with RW and close supervision and ambulated 384fwith RW and close supervision using a "hop to" pattern; educated pt on importance of not "hopping" too far to avoid excessive strain on L knee. MD present for morning rounds and discussed pt's request to move D/C date up to Sat (will discuss further with OT). Pt then performed BUE strengthening on UBE on level 10 alternating 1 minute forward and 1 minute backwards for a total of 6 minutes. Concluded session with pt sitting in WC, needs within reach, and chair pad alarm on. Provided pt with fresh drinks.   Treatment Session 2 Received pt sitting in WC, pt agreeable to PT treatment, and denied any pain during session. Session with emphasis on WC  mobility, generalized strengthening, and improved activity tolerance. Pt performed WC mobility >25023f 1, 45f39f1, and >150ft37f using BUE and supervision/mod I to/from [place] rest breaks throughout due to fatigue. Pt demonstrates decreased propulsion stride and decreased speed due to UE weakness/fatigue. Discussed discharge plan and pt reported he is still unsure if he has any steps to go up at his friend's house but has bumped up/down stair numerous times prior to his amputation when he was having a lot of foot pain and verbalized confidence with task; ultimately declining practicing. Concluded session with pt sitting in WC, needs within reach, and chair pad alarm on. Provided pt with fresh drinks.   Therapy Documentation Precautions:  Precautions Precautions: Fall Restrictions Weight Bearing Restrictions: Yes RLE Weight Bearing: Non weight bearing  Therapy/Group: Individual Therapy Flora Ratz Orison AcyDPT   09/08/2020, 7:12 AM

## 2020-09-09 ENCOUNTER — Other Ambulatory Visit (HOSPITAL_COMMUNITY): Payer: Self-pay

## 2020-09-09 LAB — GLUCOSE, CAPILLARY
Glucose-Capillary: 107 mg/dL — ABNORMAL HIGH (ref 70–99)
Glucose-Capillary: 113 mg/dL — ABNORMAL HIGH (ref 70–99)
Glucose-Capillary: 150 mg/dL — ABNORMAL HIGH (ref 70–99)
Glucose-Capillary: 156 mg/dL — ABNORMAL HIGH (ref 70–99)

## 2020-09-09 MED ORDER — INSULIN PEN NEEDLE 32G X 4 MM MISC: 0 refills | Status: DC

## 2020-09-09 NOTE — Progress Notes (Signed)
Inpatient Rehabilitation Care Coordinator Discharge Note  The overall goal for the admission was met for: DC SAT 7/23  Discharge location: Yes-HOME WITH FRIENDS-INTERMITTENT ASSIST  Length of Stay: No-PT REQUESTED TO DC SOONER-9 DAYS  Discharge activity level: No-DID NOT MEET GOALS DUE TO DC EARLY-SUPERVISION LEVEL WILL NOT HAVE AT HOME  Home/community participation: Yes  Services provided included: MD, RD, PT, OT, RN, CM, Pharmacy, and SW  Financial Services: Other: NONE  Choices offered to/list presented to:PT  Follow-up services arranged: DME: [employer] [facility]-PT NEEDED TO CALL TO PUT CARD ON FILE UP TO PT IF DONE and Patient/Family has no preference for HH/DME agencies HOME EXERCISE PROGRAM DUE TO UNINSURED AND COULD NOT [place] TO TAKE  Comments (or additional information):PT FEELS CAN MANAGE AT CURRENT LEVEL ANDS WANTS TO START HIS JOB ON Tuesday.  Patient/Family verbalized understanding of follow-up arrangements: Yes  Individual responsible for coordination of the follow-up plan: SELF  Confirmed correct DME delivered: Elease Hashimoto 09/09/2020    Elease Hashimoto

## 2020-09-09 NOTE — Progress Notes (Signed)
Physical Therapy Session Note  Patient Details  Name: Patrick [name] MRN: [id number] Date of Birth: 1965-10-03  Today's Date: 09/09/2020 PT Individual Time: 3014-9969; 2493-2419 PT Individual Time Calculation (min): 54 min and 32 min  Short Term Goals: Week 1:  PT Short Term Goal 1 (Week 1): pt will transfer sit<>stand with LRAD and CGA PT Short Term Goal 2 (Week 1): pt will transfer bed<>chair with LRAD and CGA PT Short Term Goal 3 (Week 1): pt will initate gait training  Skilled Therapeutic Interventions/Progress Updates:  Session 1  Pt received sitting in WC w/OT for handoff. Denies pain and is agreeable to PT. Pt very distracted by cell phone and required time to begin therapy. Pt self-propelled in WC from room to [place] hallway independently. Sit <> stand w/supervision from [facility] to RW. Ambulated 44 ft w/RW and supervision. Noted decreased foot clearance as pt fatigued, as well as decreased step length using hop-to pattern and over-relaince on UE w/forward flexed posture. Educated pt on energy conservation as well as knowing when to rest based on his feeling of over-reliance on UE to walk. Pt reported need to have BM so pt transported back to room in [facility] w/total A for time management. Performed squat pivot from WC to toilet w/supervision. Pt able to doff pants w/supervision. Pt performed peri care independently. Sit <> stand from standard toilet to RW w/mod A 2/2 pt leaning too far R to attempt to pull pants up. Educated pt on proper way to stand and to let others assist him to reduce fall risk. Attempted sit <>stand again w/RW and RUE support on grab bar w/min A. Therapist donned pt's pants w/total A for safety. Pt ambulated 5' in room to get to [facility] w/RW and supervision. Pt self-propelled >200' in [facility] from room to day gym independently. Pt was left sitting in day room for drumming group therapy and all immediate needs met.   Session 2  Pt received sitting in WC in room finishing lunch. Pt denied  pain and requested to try to ambulate with axillary crutches during session. Educated pt on safety concerns with crutches and after some consideration, pt was in agreement. He then requested to use UE bike so pt was transported to ortho gym in [facility] w/total A for time management. Pt used UE bike for 10 minutes on level 10, first half cycling forward and last half retro cycling for improved shoulder ROM and strengthening to assist with gait training. Pt was transported back to room in [facility] w/total A 2/2 fatigue. Pt was left sitting in WC in room with all needs in reach.   Therapy Documentation Precautions:  Precautions Precautions: Fall Restrictions Weight Bearing Restrictions: Yes RLE Weight Bearing: Non weight bearing   Therapy/Group: Individual Therapy [name], PT, DPT  09/09/2020, 10:29 AM

## 2020-09-09 NOTE — Progress Notes (Addendum)
Patient ID: Patrick [name], male   DOB: 10-25-1965, 55 y.o.   MRN: [id number] Met with pt again to ask if has called [employer] to place card on file so can get a wheelchair prior to discharge tomorrow. He is aware it is on him and if he does not call no wheelchair will be delivered and he will be on his own to get himself into the home. He reports he will call when he gets back to his room in 45 minutes.   2:00 PM Pt did call [employer] and wheelchair delivered to room. Set for DC tomorrow.

## 2020-09-09 NOTE — Plan of Care (Signed)
  Problem: RH Bed to Chair Transfers Goal: LTG Patient will perform bed/chair transfers w/assist (PT) Description: LTG: Patient will perform bed to chair transfers with assistance (PT). 09/09/2020 1605 by Akeel Reffner, Mickie Bail E, PT Outcome: Not Met (add Reason) Note: Not met 2/2 pt requiring close supervision for balance deficits and pt self-electing to move up DC date.    Problem: RH Car Transfers Goal: LTG Patient will perform car transfers with assist (PT) Description: LTG: Patient will perform car transfers with assistance (PT). 09/09/2020 1605 by Brien Lowe, Mickie Bail E, PT Outcome: Not Met (add Reason) Note: Not met 2/2 pt requiring CGA for car transfer and self-electing to move up DC date.

## 2020-09-09 NOTE — Progress Notes (Signed)
Occupational Therapy Session Note  Patient Details  Name: Patrick [name] MRN: [id number] Date of Birth: 11-29-1965  Today's Date: 09/09/2020 OT Individual Time: 0904-1003 OT Individual Time Calculation (min): 59 min    Short Term Goals: Week 1:  OT Short Term Goal 1 (Week 1): Pt will complete LB dressing at sit > stand level with CGA OT Short Term Goal 2 (Week 1): Pt will complete toilet transfer with supervision OT Short Term Goal 3 (Week 1): Pt will complete bathing at sit > stand level with CGA  Skilled Therapeutic Interventions/Progress Updates:    Pt participated in rhythmic drumming group. Pain not reported during session Focus of group on BUE coordination, strengthening, endurance, timing/control, activity tolerance, and social participation and engagement. Pt performs session from seated position for energy conservation. Skilled interventions included grading movements up for BUE strengthening and ROM. Warm up performed prior to exercises and UB stretching completed at end of group with demo from OT. Pt able to select preferred song to share with group. Returned pt to room at end of session. Exited session with pt seated in w/c, exit alarm on and call light in reach   Therapy Documentation Precautions:  Precautions Precautions: Fall Restrictions Weight Bearing Restrictions: Yes RLE Weight Bearing: Non weight bearing General:   Vital Signs:  Pain: Pain Assessment Pain Scale: 0-10 Pain Score: 0-No pain ADL: ADL Eating: Set up Grooming: Modified independent Where Assessed-Grooming: Sitting at sink, Wheelchair Upper Body Bathing: Supervision/safety Where Assessed-Upper Body Bathing: Edge of bed Lower Body Bathing: Supervision/safety Where Assessed-Lower Body Bathing: Edge of bed Upper Body Dressing: Modified independent (Device) Where Assessed-Upper Body Dressing: Edge of bed Lower Body Dressing: Supervision/safety Where Assessed-Lower Body Dressing: Edge of  bed Toileting: Modified independent Where Assessed-Toileting: Bedside Commode Toilet Transfer: Close supervision Toilet Transfer Method: Engineer, water: Drop arm bedside Microbiologist: Close supervision Social research officer, government Method: Education officer, environmental: Radio broadcast assistant Vision Baseline Vision/History: Wears glasses Wears Glasses: Reading only Patient Visual Report: No change from baseline Vision Assessment?: No apparent visual deficits Perception  Perception: Within Functional Limits Praxis Praxis: Intact Exercises:   Other Treatments:     Therapy/Group: Group Therapy  Tonny Branch 09/09/2020, 12:36 PM

## 2020-09-09 NOTE — Progress Notes (Signed)
Occupational Therapy Discharge Summary  Patient Details  Name: Patrick [name] MRN: [id number] Date of Birth: 12-Mar-1965  Today's Date: 09/09/2020 OT Individual Time: 4665-9935 OT Individual Time Calculation (min): 59 min   Patient has met 7 of 10 long term goals due to improved activity tolerance, improved balance, postural control, improved awareness, and improved coordination.  Patient to discharge at overall Supervision level.  Patient's care partner is independent to provide the necessary physical assistance at discharge.    Reasons goals not met: Patient requires supervision for balance transfers and unable to assess IADL goals  Recommendation:  Patient will benefit from ongoing skilled OT services in home health setting to continue to advance functional skills in the area of BADL, iADL, Vocation, and Reduce care partner burden.  Equipment: Bariatric drop arm BSC, bariatric tub transfer bench  Reasons for discharge: treatment goals met  Patient/family agrees with progress made and goals achieved: Yes  OT Discharge Precautions/Restrictions  Precautions Precautions: Fall Restrictions Weight Bearing Restrictions: Yes RLE Weight Bearing: Non weight bearing Pain Pain Assessment Pain Scale: 0-10 Pain Score: 0-No pain ADL ADL Eating: Set up Grooming: Modified independent Where Assessed-Grooming: Sitting at sink, Wheelchair Upper Body Bathing: Supervision/safety Where Assessed-Upper Body Bathing: Edge of bed Lower Body Bathing: Supervision/safety Where Assessed-Lower Body Bathing: Edge of bed Upper Body Dressing: Modified independent (Device) Where Assessed-Upper Body Dressing: Edge of bed Lower Body Dressing: Supervision/safety Where Assessed-Lower Body Dressing: Edge of bed Toileting: Modified independent Where Assessed-Toileting: Bedside Commode Toilet Transfer: Close supervision Toilet Transfer Method: Engineer, water: Drop arm bedside  Microbiologist: Close supervision Social research officer, government Method: Education officer, environmental: Radio broadcast assistant Vision Baseline Vision/History: Wears glasses Wears Glasses: Reading only Patient Visual Report: No change from baseline Vision Assessment?: No apparent visual deficits Perception  Perception: Within Functional Limits Praxis Praxis: Intact Cognition Overall Cognitive Status: Within Functional Limits for tasks assessed Arousal/Alertness: Awake/alert Orientation Level: Oriented X4 Selective Attention: Appears intact Memory: Appears intact Awareness: Appears intact Problem Solving: Appears intact Safety/Judgment: Appears intact Comments: mild decreased insight into deficits Sensation Sensation Light Touch: Impaired by gross assessment Hot/Cold: Appears Intact Proprioception: Impaired by gross assessment Stereognosis: Appears Intact Additional Comments: absent sensation along incision of R residual limb. Absent sensation along L medial/lateral ankle and great toe du to neuropathy Coordination Gross Motor Movements are Fluid and Coordinated: No Fine Motor Movements are Fluid and Coordinated: Yes Coordination and Movement Description: grossly uncoordinated due to generalized weakness and deconditioning, decreased balance/postural control, and pain Motor  Motor Motor: Abnormal postural alignment and control Motor - Skilled Clinical Observations: grossly uncoordinated due to generalized weakness and deconditioning, decreased balance/postural control, and fatigue Mobility  Bed Mobility Bed Mobility: Rolling Right;Rolling Left;Sit to Supine;Supine to Sit Rolling Right: Independent with assistive device Rolling Left: Independent with assistive device Supine to Sit: Independent with assistive device Sit to Supine: Independent with assistive device Transfers Sit to Stand: Supervision/Verbal cueing Stand to Sit: Supervision/Verbal cueing   Trunk/Postural Assessment  Cervical Assessment Cervical Assessment: Within Functional Limits Thoracic Assessment Thoracic Assessment: Exceptions to [facility] Lumbar Assessment Lumbar Assessment: Exceptions to [facility] Postural Control Postural Control: Deficits on evaluation  Balance Balance Balance Assessed: Yes Static Sitting Balance Static Sitting - Balance Support: Bilateral upper extremity supported;Feet supported Static Sitting - Level of Assistance: 6: Modified independent (Device/Increase time) Dynamic Sitting Balance Dynamic Sitting - Balance Support: Feet supported;Bilateral upper extremity supported Dynamic Sitting - Level of Assistance: 6: Modified independent (Device/Increase time) Static Standing  Balance Static Standing - Balance Support: Bilateral upper extremity supported Static Standing - Level of Assistance: 5: Stand by assistance Dynamic Standing Balance Dynamic Standing - Balance Support: Bilateral upper extremity supported Dynamic Standing - Level of Assistance: 5: Stand by assistance Extremity/Trunk Assessment RUE Assessment RUE Assessment: Within Functional Limits LUE Assessment LUE Assessment: Within Functional Limits  Short Term Goals: Week 1:  OT Short Term Goal 1 (Week 1): Pt will complete LB dressing at sit > stand level with CGA OT Short Term Goal 2 (Week 1): Pt will complete toilet transfer with supervision OT Short Term Goal 3 (Week 1): Pt will complete bathing at sit > stand level with CGA  Skilled Therapeutic Interventions/Progress Updates:  Met pt sitting in w/c, pt agreed to session. Treatment session was focused on discharge planning. OT and OTS offered ADLs, pt declined due to already completing tasks. Pt willing to simulate bathroom transfers and tasks. Pt self-propelled throughout bedroom and bathroom. Pt squat pivoted to [facility] in shower with supervision. Pt able to manipulate toothpaste and toothbrush sitting at sink with Mod Ind. Pt reported primarily  donning and doffing LB dressing via lateral leans sitting EOB at home, pt able to complete tasks with supervision. OT and OTS inquired of further questions or concerns, pt declined. OTS provided energy conservation handout, pt able to communicate various ways to conserve energy at home. Pt was left sitting in w/c, chair alarm on, all needs met.    Samel Bruna 09/09/2020, 12:20 PM

## 2020-09-09 NOTE — Progress Notes (Signed)
PROGRESS NOTE   Subjective/Complaints: Excited about going home tomorrow. Feels prepared. Shrinker fitting well now.   ROS: Patient denies fever, rash, sore throat, blurred vision, nausea, vomiting, diarrhea, cough, shortness of breath or chest pain,   headache, or mood change.    Objective:   No results found. No results for input(s): WBC, HGB, HCT, PLT in the last 72 hours.  Recent Labs    09/08/20 0723  NA 138  K 4.7  CL 105  CO2 24  GLUCOSE 118*  BUN 25*  CREATININE 1.65*  CALCIUM 8.7*    Intake/Output Summary (Last 24 hours) at 09/09/2020 1242 Last data filed at 09/09/2020 0700 Gross per 24 hour  Intake 560 ml  Output 600 ml  Net -40 ml        Physical Exam: Vital Signs Blood pressure (!) 147/88, pulse 94, temperature 98.8 F (37.1 C), temperature source Oral, resp. rate 20, height '6\' 4"'$  (1.93 m), weight (!) 144 kg, SpO2 100 %. Constitutional: No distress . Vital signs reviewed. HEENT: EOMI, oral membranes moist Neck: supple Cardiovascular: RRR without murmur. No JVD    Respiratory/Chest: CTA Bilaterally without wheezes or rales. Normal effort    GI/Abdomen: BS +, non-tender, non-distended Ext: no clubbing, cyanosis, or edema Psych: pleasant and cooperative  Skin: In limb guard- right bka is less bulbous/swollen, "T" incision with staples, minimal drainage. Shrinker in place this AM  Macerated area in abdominal/waist fold dryer. Neuro: Pt is cognitively appropriate with normal insight, memory, and awareness. Cranial nerves 2-12 are intact. Sensory exam is normal. Reflexes are 2+ in all 4's. Fine motor coordination is intact. No tremors. Motor function is grossly 5/5 UE. RLE--gravity strength. Musculoskeletal: right shrinker fitting up over knee   Assessment/Plan: 1. Functional deficits which require 3+ hours per day of interdisciplinary therapy in a comprehensive inpatient rehab setting. Physiatrist  is providing close team supervision and 24 hour management of active medical problems listed below. Physiatrist and rehab team continue to assess barriers to discharge/monitor patient progress toward functional and medical goals  Care Tool:  Bathing    Body parts bathed by patient: Right arm, Left arm, Chest, Abdomen, Front perineal area, Right upper leg, Left upper leg, Face, Buttocks, Left lower leg   Body parts bathed by helper: Buttocks, Left lower leg Body parts n/a: Right lower leg   Bathing assist Assist Level: Supervision/Verbal cueing     Upper Body Dressing/Undressing Upper body dressing   What is the patient wearing?: Pull over shirt    Upper body assist Assist Level: Independent with assistive device    Lower Body Dressing/Undressing Lower body dressing      What is the patient wearing?: Pants, Underwear/pull up     Lower body assist Assist for lower body dressing: Supervision/Verbal cueing     Toileting Toileting    Toileting assist Assist for toileting: Independent with assistive device     Transfers Chair/bed transfer  Transfers assist  Chair/bed transfer activity did not occur: Safety/medical concerns  Chair/bed transfer assist level: Supervision/Verbal cueing     Locomotion Ambulation   Ambulation assist   Ambulation activity did not occur: Safety/medical concerns  Assist level: Supervision/Verbal cueing  Assistive device: Walker-rolling Max distance: 71f   Walk 10 feet activity   Assist  Walk 10 feet activity did not occur: Safety/medical concerns (weakness, fatigue, decreasd balance/postural control)  Assist level: Supervision/Verbal cueing Assistive device: Walker-rolling   Walk 50 feet activity   Assist Walk 50 feet with 2 turns activity did not occur: Safety/medical concerns (weakness, fatigue, decreasd balance/postural control)  Assist level: Contact Guard/Touching assist Assistive device: Walker-rolling    Walk 150  feet activity   Assist Walk 150 feet activity did not occur: Safety/medical concerns (fatigue, weakness/deconditioning)         Walk 10 feet on uneven surface  activity   Assist Walk 10 feet on uneven surfaces activity did not occur: Safety/medical concerns (weakness, fatigue, decreasd balance/postural control)   Assist level: Minimal Assistance - Patient > 75% Assistive device: WAeronautical engineerWill patient use wheelchair at discharge?: Yes Type of Wheelchair: Manual    Wheelchair assist level: Independent Max wheelchair distance: 1541f   Wheelchair 50 feet with 2 turns activity    Assist        Assist Level: Independent   Wheelchair 150 feet activity     Assist      Assist Level: Independent   Blood pressure (!) 147/88, pulse 94, temperature 98.8 F (37.1 C), temperature source Oral, resp. rate 20, height '6\' 4"'$  (1.93 m), weight (!) 144 kg, SpO2 100 %.    Medical Problem List and Plan: 1.   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 [facility]  Impaired mobility, ambulating 40 feet: continue Lovenox             -antiplatelet therapy: N/A 3. Pain Management: Neurontin 300 mg 3 times daily, oxycodone as needed  - prn tizanidine for muscle spasm -have discussed some stretches for right hip. Needs to be careful in short term to avoid overexerting with right hip flexion. This is more of a hip flexor strain then a groin strain, likely rectus femoris, possibly iliopsoas.   -prn heat -7/22: well controlled, continue above regimen.  4. Mood: Provide emotional support             -antipsychotic agents: N/A 5. Neuropsych: This patient is capable of making decisions on his own  behalf. 6. Skin/Wound Care: Routine skin checks 7. Fluids/Electrolytes/Nutrition: Routine in and outs with follow-up chemistries 8.  Acute on chronic anemia.  Patient had received total 4 units packed red blood cells.  Continue iron supplement.  Follow-up hgb 8.4 7/18 9.  Diabetes mellitus with peripheral neuropathy.  Hemoglobin A1c 7.8.  Lantus insulin 25 units twice daily, NovoLog 4 units 3 times daily with meals.    CBG (last 3)  Recent Labs (last 2 labs)        Recent Labs    09/04/20 1657 09/04/20 2111 09/05/20 0608  GLUCAP 185* 146* 100*     7/22- fair control, continue current regimen. ---can adjust regimen further once home 10.  Right elbow gout.  Uric acid 11.2.  Zyloprim 100 mg twice daily.  Course of colchicine has been completed.             - improved after 2 days indocin 11.  ID/sepsis.  Blood cultures 1/4 grew staph.  Completing course of amoxicillin after initially being maintained on cefepime             7/15 wbc's down to 10.2 12.  CKD stage III.   7/15 BUN/Cr 20/1.6 Cr. Slightly elevated. Only received 2 days nsaid. Educated regarding staying well hydrated. 7/22: downtrending, monitor weekly.  13.  Recent history C. difficile 06/2020.  Completing course of p.o. vancomycin.  Contact precautions discontinued 14.VRE/UTI.Contact precautions and complete course of Zyvox 14.  Obesity.  BMI 38.27.  Dietary follow-up 15.  Tachycardia.  Continue Lopressor 12.5 mg twice daily.  HR controlled     LOS: 8 days A FACE TO FACE EVALUATION WAS PERFORMED  Meredith Staggers 09/09/2020, 12:42 PM

## 2020-09-10 LAB — GLUCOSE, CAPILLARY
Glucose-Capillary: 117 mg/dL — ABNORMAL HIGH (ref 70–99)
Glucose-Capillary: 83 mg/dL (ref 70–99)

## 2020-09-10 NOTE — Progress Notes (Signed)
Patient discharged home with family member, Medications sent home with patient + personal belongings. Patient discharged on a wheelchair in a private car. No complaints reported upon discharge. All questions answered.

## 2020-09-10 NOTE — Progress Notes (Signed)
PROGRESS NOTE   Subjective/Complaints:   ROS: Patient denies    Objective:   No results found. No results for input(s): WBC, HGB, HCT, PLT in the last 72 hours.  Recent Labs    09/08/20 0723  NA 138  K 4.7  CL 105  CO2 24  GLUCOSE 118*  BUN 25*  CREATININE 1.65*  CALCIUM 8.7*     Intake/Output Summary (Last 24 hours) at 09/10/2020 0851 Last data filed at 09/10/2020 0800 Gross per 24 hour  Intake 560 ml  Output 1575 ml  Net -1015 ml         Physical Exam: Vital Signs Blood pressure (!) 135/91, pulse 96, temperature 98.3 F (36.8 C), resp. rate 20, height '6\' 4"'$  (1.93 m), weight (!) 144 kg, SpO2 99 %.  Neuro: Pt is cognitively appropriate with normal insight, memory, and awareness. Cranial nerves 2-12 are intact. Sensory exam is normal. Reflexes are 2+ in all 4's. Fine motor coordination is intact. No tremors. Motor function is grossly 5/5 UE. RLE--gravity strength. Musculoskeletal: right shrinker fitting up over knee   Assessment/Plan: 1. Functional deficits due to R BKA Stable for D/C today F/u PCP in 3-4 weeks F/u PM&R 2 weeks F/u ortho 1-2 wks See D/C summary See D/C instructions   Care Tool:  Bathing    Body parts bathed by patient: Right arm, Left arm, Chest, Abdomen, Front perineal area, Right upper leg, Left upper leg, Face, Buttocks, Left lower leg   Body parts bathed by helper: Buttocks, Left lower leg Body parts n/a: Right lower leg   Bathing assist Assist Level: Supervision/Verbal cueing     Upper Body Dressing/Undressing Upper body dressing   What is the patient wearing?: Pull over shirt    Upper body assist Assist Level: Independent with assistive device    Lower Body Dressing/Undressing Lower body dressing      What is the patient wearing?: Pants, Underwear/pull up     Lower body assist Assist for lower body dressing: Supervision/Verbal cueing      Toileting Toileting    Toileting assist Assist for toileting: Independent with assistive device     Transfers Chair/bed transfer  Transfers assist  Chair/bed transfer activity did not occur: Safety/medical concerns  Chair/bed transfer assist level: Supervision/Verbal cueing     Locomotion Ambulation   Ambulation assist   Ambulation activity did not occur: Safety/medical concerns  Assist level: Supervision/Verbal cueing Assistive device: Walker-rolling Max distance: 44'   Walk 10 feet activity   Assist  Walk 10 feet activity did not occur: Safety/medical concerns (weakness, fatigue, decreasd balance/postural control)  Assist level: Supervision/Verbal cueing Assistive device: Walker-rolling   Walk 50 feet activity   Assist Walk 50 feet with 2 turns activity did not occur: Safety/medical concerns (weakness, fatigue, decreasd balance/postural control)  Assist level: Contact Guard/Touching assist Assistive device: Walker-rolling    Walk 150 feet activity   Assist Walk 150 feet activity did not occur: Safety/medical concerns (Fatigue, weakness, deconditioning)         Walk 10 feet on uneven surface  activity   Assist Walk 10 feet on uneven surfaces activity did not occur: Safety/medical concerns (weakness, fatigue, decreasd balance/postural control)  Assist level: Minimal Assistance - Patient > 75% Assistive device: Aeronautical engineer Will patient use wheelchair at discharge?: Yes Type of Wheelchair: Manual    Wheelchair assist level: Independent Max wheelchair distance: 200'    Wheelchair 50 feet with 2 turns activity    Assist        Assist Level: Independent   Wheelchair 150 feet activity     Assist      Assist Level: Independent   Blood pressure (!) 135/91, pulse 96, temperature 98.3 F (36.8 C), resp. rate 20, height '6\' 4"'$  (1.93 m), weight (!) 144 kg, SpO2 99 %.    Medical Problem List and  Plan: 1.   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  Impaired mobility, ambulating 40 feet: continue Lovenox             -antiplatelet therapy: N/A 3. Pain Management: Neurontin 300 mg 3 times daily, oxycodone as needed  - prn tizanidine for muscle spasm -have discussed some stretches for right hip. Needs to be careful in short term to avoid overexerting with right hip flexion. This is more of a hip flexor strain then a groin strain, likely rectus femoris, possibly iliopsoas.   -prn heat -7/22: well controlled, continue above regimen.  4. Mood: Provide emotional support             -antipsychotic agents: N/A 5. Neuropsych: This patient is capable of making decisions on his own behalf. 6. Skin/Wound Care: Routine skin checks 7. Fluids/Electrolytes/Nutrition: Routine in and outs with follow-up chemistries 8.  Acute on chronic anemia.  Patient had received total 4 units packed red blood cells.  Continue iron supplement.  Follow-up hgb 8.4 7/18 9.  Diabetes mellitus with peripheral neuropathy.  Hemoglobin A1c 7.8.  Lantus insulin 25 units twice daily, NovoLog 4 units 3 times daily with meals.    CBG (last 3)  Recent Labs (last 2 labs)        Recent Labs    09/04/20 1657 09/04/20 2111 09/05/20 0608  GLUCAP 185* 146* 100*     7/23 controlled  10.  Right elbow gout.  Uric acid 11.2.  Zyloprim 100 mg twice daily.  Course of colchicine has been completed.             - improved after 2 days indocin 11.  ID/sepsis.  Blood cultures 1/4 grew staph.  Completing course of amoxicillin after initially being maintained on cefepime             7/15 wbc's down to 10.2 12.  CKD stage III.   7/15 BUN/Cr 20/1.6 Cr. Slightly elevated. Only received 2  days nsaid. Educated regarding staying well hydrated. 7/23 stable BUN 25 and creat 1.65 13.  Recent history C. difficile 06/2020.  Completing course of p.o. vancomycin.  Contact precautions discontinued 14.VRE/UTI.Contact precautions and complete course of Zyvox 14.  Obesity.  BMI 38.27.  Dietary follow-up 15.  Tachycardia.  Continue Lopressor 12.5 mg twice daily.  HR controlled     LOS: 9 days A FACE TO FACE EVALUATION WAS PERFORMED  Charlett Blake 09/10/2020, 8:51 AM

## 2020-09-13 ENCOUNTER — Emergency Department (HOSPITAL_COMMUNITY)
Admission: EM | Admit: 2020-09-13 | Discharge: 2020-09-15 | Disposition: A | Discharge status: Home / Self Care | Payer: Self-pay | Attending: Emergency Medicine | Admitting: Emergency Medicine

## 2020-09-13 ENCOUNTER — Emergency Department (HOSPITAL_COMMUNITY): Payer: Self-pay

## 2020-09-13 DIAGNOSIS — E1122 Type 2 diabetes mellitus with diabetic chronic kidney disease: Secondary | ICD-10-CM | POA: Insufficient documentation

## 2020-09-13 DIAGNOSIS — D72829 Elevated white blood cell count, unspecified: Secondary | ICD-10-CM | POA: Insufficient documentation

## 2020-09-13 DIAGNOSIS — M25551 Pain in right hip: Secondary | ICD-10-CM | POA: Insufficient documentation

## 2020-09-13 DIAGNOSIS — T148XXA Other injury of unspecified body region, initial encounter: Secondary | ICD-10-CM

## 2020-09-13 DIAGNOSIS — I129 Hypertensive chronic kidney disease with stage 1 through stage 4 chronic kidney disease, or unspecified chronic kidney disease: Secondary | ICD-10-CM | POA: Insufficient documentation

## 2020-09-13 DIAGNOSIS — W01198A Fall on same level from slipping, tripping and stumbling with subsequent striking against other object, initial encounter: Secondary | ICD-10-CM | POA: Insufficient documentation

## 2020-09-13 DIAGNOSIS — Z794 Long term (current) use of insulin: Secondary | ICD-10-CM | POA: Insufficient documentation

## 2020-09-13 DIAGNOSIS — M79604 Pain in right leg: Secondary | ICD-10-CM | POA: Insufficient documentation

## 2020-09-13 DIAGNOSIS — T8789 Other complications of amputation stump: Secondary | ICD-10-CM | POA: Insufficient documentation

## 2020-09-13 DIAGNOSIS — R000 Tachycardia, unspecified: Secondary | ICD-10-CM | POA: Insufficient documentation

## 2020-09-13 DIAGNOSIS — R079 Chest pain, unspecified: Secondary | ICD-10-CM

## 2020-09-13 DIAGNOSIS — N183 Chronic kidney disease, stage 3 unspecified: Secondary | ICD-10-CM | POA: Insufficient documentation

## 2020-09-13 DIAGNOSIS — Z8616 Personal history of COVID-19: Secondary | ICD-10-CM | POA: Insufficient documentation

## 2020-09-13 DIAGNOSIS — W19XXXA Unspecified fall, initial encounter: Secondary | ICD-10-CM

## 2020-09-13 DIAGNOSIS — M25562 Pain in left knee: Secondary | ICD-10-CM | POA: Insufficient documentation

## 2020-09-13 DIAGNOSIS — Z79899 Other long term (current) drug therapy: Secondary | ICD-10-CM | POA: Insufficient documentation

## 2020-09-13 LAB — CBC WITH DIFFERENTIAL/PLATELET
Abs Immature Granulocytes: 0.05 10*3/uL (ref 0.00–0.07)
Basophils Absolute: 0.1 10*3/uL (ref 0.0–0.1)
Basophils Relative: 1 %
Eosinophils Absolute: 0.1 10*3/uL (ref 0.0–0.5)
Eosinophils Relative: 1 %
HCT: 27.2 % — ABNORMAL LOW (ref 39.0–52.0)
Hemoglobin: 8.1 g/dL — ABNORMAL LOW (ref 13.0–17.0)
Immature Granulocytes: 0 %
Lymphocytes Relative: 18 %
Lymphs Abs: 2.3 10*3/uL (ref 0.7–4.0)
MCH: 25.2 pg — ABNORMAL LOW (ref 26.0–34.0)
MCHC: 29.8 g/dL — ABNORMAL LOW (ref 30.0–36.0)
MCV: 84.5 fL (ref 80.0–100.0)
Monocytes Absolute: 1 10*3/uL (ref 0.1–1.0)
Monocytes Relative: 8 %
Neutro Abs: 9.3 10*3/uL — ABNORMAL HIGH (ref 1.7–7.7)
Neutrophils Relative %: 72 %
Platelets: 461 10*3/uL — ABNORMAL HIGH (ref 150–400)
RBC: 3.22 MIL/uL — ABNORMAL LOW (ref 4.22–5.81)
RDW: 17.2 % — ABNORMAL HIGH (ref 11.5–15.5)
WBC: 12.8 10*3/uL — ABNORMAL HIGH (ref 4.0–10.5)
nRBC: 0 % (ref 0.0–0.2)

## 2020-09-13 LAB — BASIC METABOLIC PANEL
Anion gap: 11 (ref 5–15)
BUN: 20 mg/dL (ref 6–20)
CO2: 23 mmol/L (ref 22–32)
Calcium: 8.5 mg/dL — ABNORMAL LOW (ref 8.9–10.3)
Chloride: 102 mmol/L (ref 98–111)
Creatinine, Ser: 1.47 mg/dL — ABNORMAL HIGH (ref 0.61–1.24)
GFR, Estimated: 57 mL/min — ABNORMAL LOW (ref >60)
Glucose, Bld: 190 mg/dL — ABNORMAL HIGH (ref 70–99)
Potassium: 4.4 mmol/L (ref 3.5–5.1)
Sodium: 136 mmol/L (ref 135–145)

## 2020-09-13 LAB — URINALYSIS, ROUTINE W REFLEX MICROSCOPIC
Bacteria, UA: NONE SEEN
Bilirubin Urine: NEGATIVE
Glucose, UA: NEGATIVE mg/dL
Ketones, ur: NEGATIVE mg/dL
Leukocytes,Ua: NEGATIVE
Nitrite: NEGATIVE
Protein, ur: 30 mg/dL — AB
Specific Gravity, Urine: 1.011 (ref 1.005–1.030)
pH: 6 (ref 5.0–8.0)

## 2020-09-13 LAB — LACTIC ACID, PLASMA: Lactic Acid, Venous: 0.9 mmol/L (ref 0.5–1.9)

## 2020-09-13 MED ORDER — ONDANSETRON HCL 4 MG/2ML IJ SOLN
4.0000 mg | Freq: Once | INTRAMUSCULAR | Status: AC
Start: 2020-09-13 — End: 2020-09-13
  Administered 2020-09-13: 4 mg via INTRAVENOUS
  Filled 2020-09-13: qty 2

## 2020-09-13 MED ORDER — SODIUM CHLORIDE 0.9 % IV BOLUS
1000.0000 mL | Freq: Once | INTRAVENOUS | Status: AC
  Administered 2020-09-13: 1000 mL via INTRAVENOUS

## 2020-09-13 MED ORDER — OXYCODONE-ACETAMINOPHEN 5-325 MG PO TABS
1.0000 | ORAL_TABLET | Freq: Once | ORAL | Status: AC
  Administered 2020-09-13: 1 via ORAL
  Filled 2020-09-13: qty 1

## 2020-09-13 MED ORDER — MORPHINE SULFATE (PF) 4 MG/ML IV SOLN
4.0000 mg | Freq: Once | INTRAVENOUS | Status: AC
  Administered 2020-09-13: 4 mg via INTRAVENOUS
  Filled 2020-09-13: qty 1

## 2020-09-13 MED ORDER — SODIUM CHLORIDE 0.9 % IV BOLUS
500.0000 mL | Freq: Once | INTRAVENOUS | Status: AC
Start: 2020-09-13 — End: 2020-09-13
  Administered 2020-09-13: 500 mL via INTRAVENOUS

## 2020-09-13 MED ORDER — LABETALOL HCL 5 MG/ML IV SOLN
5.0000 mg | Freq: Once | INTRAVENOUS | Status: DC
  Filled 2020-09-13: qty 4

## 2020-09-13 MED ORDER — METOPROLOL TARTRATE 25 MG PO TABS
12.5000 mg | ORAL_TABLET | Freq: Two times a day (BID) | ORAL | Status: DC
  Administered 2020-09-13 – 2020-09-14 (×3): 12.5 mg via ORAL
  Filled 2020-09-13 (×3): qty 1

## 2020-09-13 MED ORDER — OXYCODONE HCL 5 MG PO TABS
10.0000 mg | ORAL_TABLET | ORAL | Status: DC | PRN
  Administered 2020-09-13 – 2020-09-14 (×3): 10 mg via ORAL
  Filled 2020-09-13 (×3): qty 2

## 2020-09-13 NOTE — ED Provider Notes (Signed)
[facility] EMERGENCY DEPARTMENT Provider Note   CSN: [id number] Arrival date & time: 09/13/20  1000     History Chief Complaint  Patient presents with   Fall   Wound Check   Post-op Problem    Patrick [name] is a 55 y.o. male.  Patrick [name] is a 55 y.o. 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  Patient reports he had a right BKA done by Dr. Louanne Skye 2 weeks ago.  He was admitted to inpatient rehab but had to leave early to try and preserve his job, and he still having some difficulty transferring and getting around.  He is currently staying in a house with friends and he reports it is not a great set up as there are stairs and narrow doorways that make it difficult for him to get around.  He fell twice this morning hitting his stump.  He reports that he thinks 2 staples came out and he had small amount of bleeding initially.  He is also complaining of pain at his right hip as well as left knee pain.  He reports he has been having pain in the medial hip and groin ever since his surgery.  He reports the first time he tried to get up after surgery he had pain in this area, they treated him with indomethacin during hospitalization but patient does have chronic kidney disease so this was not a long-term option.  Reports he is continue to intermittently have this similar right hip pain, now worse today after falls.  No associated swelling in the leg.  He has not noticed any redness or drainage from the stump.  Also reports some left knee pain after hitting it during fall today.  Did not hit his head.  He is not on blood thinners.  Was on blood thinners after surgery briefly.  No other aggravating or alleviating factors.  The history is provided by the patient.      Past Medical History:  Diagnosis Date   Diabetes mellitus    Gout    History of  COVID-19 04/2019   Hypertension     Patient Active Problem List   Diagnosis Date Noted   Right below-knee amputee ([place]) 09/01/2020   ABLA (acute blood loss anemia) 09/01/2020   Anemia 08/27/2020   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 ([place]) 07/20/2020   Dehiscence of amputation stump ([place]) 07/20/2020   Cutaneous abscess of right foot    Osteomyelitis of right foot (H[facility])    Severe protein-calorie malnutrition ([place])    Sepsis due to cellulitis ([place]) 06/04/2020   Diabetic foot ulcer ([place]) 06/04/2020   Diabetic foot infection ([place]) 06/04/2020   CKD (chronic kidney disease) stage 3, GFR 30-59 ml/min ([place]) 06/04/2020   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[place]) 06/04/2020   Acute respiratory disease due to COVID-19 virus 05/03/2019   Renal insufficiency 05/03/2019   GOUT 01/27/2010   OBESITY 01/27/2010   Insulin-requiring or dependent type II diabetes mellitus ([place]) 02/20/2008   HYPERTENSION, BENIGN ESSENTIAL 02/20/2008    Past Surgical History:  Procedure Laterality Date   AMPUTATION Right 06/06/2020   Procedure: AMPUTATION 5TH RAY;  Surgeon: [name], MD;  Location: [place];  Service: Orthopedics;  Laterality: Right;   AMPUTATION Right 06/08/2020   Procedure: RIGHT 4TH RAY AMPUTATION;  Surgeon: Meridee Score  V, MD;  Location: [place];  Service: Orthopedics;  Laterality: Right;   AMPUTATION Right 08/27/2020   Procedure: AMPUTATION BELOW KNEE;  Surgeon: Jessy Oto, MD;  Location: [place];  Service: Orthopedics;  Laterality: Right;       Family History  Problem Relation Age of Onset   Diabetes Neg Hx     Social History   Tobacco Use   Smoking status: Never   Smokeless tobacco: Never  Vaping Use   Vaping Use: Never used  Substance Use Topics   Alcohol use: No   Drug use: No    Home Medications Prior to Admission medications   Medication Sig Start Date End Date Taking? Authorizing Provider  acetaminophen (TYLENOL) 325 MG tablet Take 650 mg by mouth every 6  (six) hours as needed for headache, fever or moderate pain.    [provider]  allopurinol (ZYLOPRIM) 100 MG tablet Take 1 tablet (100 mg total) by mouth 2 (two) times daily. 09/08/20   Angiulli, Lavon Paganini, PA-C  ascorbic acid (VITAMIN C) 1000 MG tablet Take 1 tablet (1,000 mg total) by mouth daily. 09/08/20   Angiulli, Lavon Paganini, PA-C  docusate sodium (COLACE) 100 MG capsule Take 1 capsule (100 mg total) by mouth daily. 09/02/20   Nicole Kindred A, DO  ferrous sulfate 325 (65 FE) MG tablet Take 1 tablet (325 mg total) by mouth 3 (three) times daily after meals. 09/08/20   Angiulli, Lavon Paganini, PA-C  gabapentin (NEURONTIN) 300 MG capsule Take 1 capsule (300 mg total) by mouth 3 (three) times daily. 09/08/20   Angiulli, Lavon Paganini, PA-C  insulin aspart (NOVOLOG FLEXPEN) 100 UNIT/ML FlexPen Inject 4 Units into the skin 3 (three) times daily with meals. 09/08/20   Angiulli, Lavon Paganini, PA-C  insulin glargine (LANTUS SOLOSTAR) 100 UNIT/ML Solostar Pen Inject 25 Units into the skin 2 (two) times daily. 09/08/20   Angiulli, Lavon Paganini, PA-C  Insulin Pen Needle 32G X 4 MM MISC Use as directed 09/08/20   [name], Clide Deutscher, MD  Insulin Pen Needle 32G X 4 MM MISC Use as directed 09/09/20   Angiulli, Lavon Paganini, PA-C  metoprolol tartrate (LOPRESSOR) 25 MG tablet Take 0.5 tablets (12.5 mg total) by mouth 2 (two) times daily. 09/08/20   Angiulli, Lavon Paganini, PA-C  Multiple Vitamin (MULTIVITAMIN WITH MINERALS) TABS tablet Take 1 tablet by mouth daily. 09/02/20   Ezekiel Slocumb, DO  Oxycodone HCl 10 MG TABS Take 1-1.5 tablets (10-15 mg total) by mouth every 4 (four) hours as needed (pain score 7-10). 09/08/20   Angiulli, Lavon Paganini, PA-C  polyethylene glycol (MIRALAX / GLYCOLAX) 17 g packet Take 17 g by mouth daily. 09/08/20   Angiulli, Lavon Paganini, PA-C  Zinc Sulfate 220 (50 Zn) MG TABS Take 1 tablet (220 mg total) by mouth daily. 09/08/20   Angiulli, Lavon Paganini, PA-C    Allergies    Patient has no known allergies.  Review of  Systems   Review of Systems  Constitutional:  Negative for chills, fatigue and fever.  HENT: Negative.    Eyes:  Negative for visual disturbance.  Respiratory:  Negative for cough and shortness of breath.   Cardiovascular:  Negative for chest pain.  Gastrointestinal:  Negative for abdominal pain, nausea and vomiting.  Genitourinary:  Negative for dysuria.  Musculoskeletal:  Positive for arthralgias and myalgias. Negative for joint swelling.  Skin:  Positive for wound. Negative for color change.  Neurological:  Negative for dizziness, syncope, weakness, light-headedness, numbness  and headaches.  All other systems reviewed and are negative.  Physical Exam Updated Vital Signs BP 125/61 (BP Location: Right Arm)   Pulse (!) 114   Temp 98.9 F (37.2 C) (Oral)   Resp 20   SpO2 100%   Physical Exam Vitals and nursing note reviewed.  Constitutional:      General: He is not in acute distress.    Appearance: Normal appearance. He is well-developed. He is not ill-appearing or diaphoretic.     Comments: Well-appearing and in no distress   HENT:     Head: Normocephalic and atraumatic.     Mouth/Throat:     Mouth: Mucous membranes are moist.     Pharynx: Oropharynx is clear.  Eyes:     General:        Right eye: No discharge.        Left eye: No discharge.  Cardiovascular:     Rate and Rhythm: Regular rhythm. Tachycardia present.     Pulses: Normal pulses.     Heart sounds: Normal heart sounds.     Comments: Mildly tachycardic with regular rhythm, heart rate in the 110s Pulmonary:     Effort: Pulmonary effort is normal. No respiratory distress.     Breath sounds: Normal breath sounds. No wheezing or rales.     Comments: Respirations equal and unlabored, patient able to speak in full sentences, lungs clear to auscultation bilaterally  Abdominal:     General: Bowel sounds are normal. There is no distension.     Palpations: Abdomen is soft. There is no mass.     Tenderness: There is  no abdominal tenderness. There is no guarding.     Comments: Abdomen soft, nondistended, nontender to palpation in all quadrants without guarding or peritoneal signs  Genitourinary:    Comments: No tenderness or abnormality palpable over the groin, no lymphadenopathy, no testicular pain or tenderness, no swelling Musculoskeletal:     Cervical back: Neck supple.     Comments: Recent right BKA, stump intact with staples in place, small area at center of staple line where it appears 1 or 2 staples may have fallen out, wound is minimally open but is pink and healthy appearing without drainage or active bleeding.  No surrounding erythema noted There is some tenderness over the right anterior hip and medial thigh without palpable deformity, swelling or ecchymosis, no shortening or rotation.  Able to range the hip with some discomfort. Mild tenderness over the left knee with small suprapatellar effusion noted.  No erythema or warmth Left distal pulses intact.  Skin:    General: Skin is warm and dry.     Capillary Refill: Capillary refill takes less than 2 seconds.  Neurological:     Mental Status: He is alert and oriented to person, place, and time.     Coordination: Coordination normal.     Comments: Speech is clear, able to follow commands Moves extremities without ataxia, coordination intact  Psychiatric:        Mood and Affect: Mood normal.        Behavior: Behavior normal.    ED Results / Procedures / Treatments   Labs (all labs ordered are listed, but only abnormal results are displayed) Labs Reviewed  BASIC METABOLIC PANEL - Abnormal; Notable for the following components:      Result Value   Glucose, Bld 190 (*)    Creatinine, Ser 1.47 (*)    Calcium 8.5 (*)    GFR, Estimated 57 (*)  All other components within normal limits  CBC WITH DIFFERENTIAL/PLATELET - Abnormal; Notable for the following components:   WBC 12.8 (*)    RBC 3.22 (*)    Hemoglobin 8.1 (*)    HCT 27.2 (*)     MCH 25.2 (*)    MCHC 29.8 (*)    RDW 17.2 (*)    Platelets 461 (*)    Neutro Abs 9.3 (*)    All other components within normal limits  URINALYSIS, ROUTINE W REFLEX MICROSCOPIC - Abnormal; Notable for the following components:   Hgb urine dipstick SMALL (*)    Protein, ur 30 (*)    All other components within normal limits  CULTURE, BLOOD (ROUTINE X 2)  CULTURE, BLOOD (ROUTINE X 2)  LACTIC ACID, PLASMA    EKG EKG Interpretation  Date/Time:  Tuesday September 13 2020 16:42:51 EDT Ventricular Rate:  117 PR Interval:  176 QRS Duration: 94 QT Interval:  318 QTC Calculation: 443 R Axis:   32 Text Interpretation: Sinus tachycardia with Premature atrial complexes Otherwise normal ECG Similar to previous Confirmed by Lavenia Atlas [phone]) on 09/13/2020 7:48:28 PM  Radiology DG Chest 2 View  Result Date: 09/13/2020 CLINICAL DATA:  55 year old male with concern for pneumonia. EXAM: CHEST - 2 VIEW COMPARISON:  Chest radiograph dated 08/31/2020 FINDINGS: No focal consolidation, pleural effusion or pneumothorax. Borderline cardiomegaly. No acute osseous pathology. IMPRESSION: No active cardiopulmonary disease. Electronically Signed   By: Anner Crete M.D.   On: 09/13/2020 21:40   DG Knee 1-2 Views Right  Result Date: 09/13/2020 CLINICAL DATA:  Left knee pain after fall. Patient status post below-the-knee amputation 08/27/2020. EXAM: RIGHT KNEE - 1-2 VIEW COMPARISON:  None. FINDINGS: No evidence of fracture, dislocation, or joint effusion. No evidence of arthropathy or other focal bone abnormality. The patient is status post below-the-knee amputation. Surgical staples are in place. No soft tissue gas or unexpected radiopaque foreign body. Mild degenerative change is seen about the knee. Soft tissues are unremarkable. IMPRESSION: No acute abnormality. Electronically Signed   By: Inge Rise M.D.   On: 09/13/2020 17:57   DG Knee Complete 4 Views Left  Result Date: 09/13/2020 CLINICAL DATA:   55 year old male with left knee pain. EXAM: LEFT KNEE - COMPLETE 4+ VIEW COMPARISON:  Right knee radiograph dated 09/13/2020. FINDINGS: There is no acute fracture or dislocation. Mild osteopenia. There is moderate arthritic changes of the left knee with tricompartmental narrowing and spurring. There is a small suprapatellar effusion. There is diffuse subcutaneous edema. IMPRESSION: 1. No acute fracture or dislocation. 2. Degenerative changes and small suprapatellar effusion. Electronically Signed   By: Anner Crete M.D.   On: 09/13/2020 17:56   DG Hip Unilat W or Wo Pelvis 2-3 Views Right  Result Date: 09/13/2020 CLINICAL DATA:  Right hip and groin pain since a fall 2 days ago. EXAM: DG HIP (WITH OR WITHOUT PELVIS) 2-3V RIGHT COMPARISON:  None. FINDINGS: There is no acute bony or joint abnormality. 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 Soft tissues are negative. IMPRESSION: No acute abnormality. Electronically Signed   By: Inge Rise M.D.   On: 09/13/2020 17:55    Procedures Procedures   Medications Ordered in ED Medications  metoprolol tartrate (LOPRESSOR) tablet 12.5 mg (12.5 mg Oral Given 09/13/20 2246)  oxyCODONE (Oxy IR/ROXICODONE) immediate release tablet 10 mg (10 mg Oral Given 09/13/20 2246)  ondansetron (ZOFRAN) injection 4 mg (4 mg Intravenous Given 09/13/20 1649)  morphine 4 MG/ML injection 4 mg (  4 mg Intravenous Given 09/13/20 1653)  sodium chloride 0.9 % bolus 500 mL (0 mLs Intravenous Stopped 09/13/20 2045)  oxyCODONE-acetaminophen (PERCOCET/ROXICET) 5-325 MG per tablet 1 tablet (1 tablet Oral Given 09/13/20 1826)  sodium chloride 0.9 % bolus 1,000 mL (0 mLs Intravenous Stopped 09/13/20 2344)  morphine 4 MG/ML injection 4 mg (4 mg Intravenous Given 09/13/20 2044)    ED Course  I have reviewed the triage vital signs and the nursing notes.  Pertinent labs & imaging results that were available during my care of the patient were reviewed by me  and considered in my medical decision making (see chart for details).     MDM Rules/Calculators/A&P                           55 year old male presents after falling twice today due to difficulties with transferring, he is still adjusting to mobility after amputation.  Complaining of pain to the right hip and groin, no associated testicular pain or swelling.  Had similar right hip pain after surgery that has recurred intermittently since then.  Also reports pain over amputation stump, thinks 2 staples may have come out and he initially had some bleeding after the second follow-up.  Also complaining of some left knee pain.  Reports he did not hit his head, no loss of consciousness.  Otherwise he has been doing well since surgery.  Denies chest pain or shortness of breath.  No abdominal pain.  On arrival patient noted to be tachycardic, mildly hypertensive, vitals otherwise normal.  He is afebrile.  Unclear cause for tachycardia, surgical wound overall appears healthy with no erythema or drainage.  Without any chest pain or shortness of breath low suspicion for PE.  Patient is having right leg and groin pain, consider higher level DVT although this pain started even while he was in the hospital and on Lovenox making this less likely, not currently on any anticoagulation.  After reviewing patient's medications it appears that he takes metoprolol 12.5 mg twice daily but has not taken this medication at all today, this may be contributing to tachycardia.  We will also assess for potential underlying infection.  I have independently ordered, reviewed and interpreted all labs and imaging: CBC: Mild leukocytosis of 12.5, hemoglobin at baseline at 8.1 BMP: Glucose 190, creatinine at baseline, no other electrolyte derangements UA: No signs of infection Lactic acid: WNL Blood cultures: Pending  Chest x-ray without pneumonia or other active cardiopulmonary disease.  X-rays of the left knee and right hip  overall reassuring, no fracture or dislocation, there is a small suprapatellar effusion on the left knee.  X-rays of the right knee and stump unremarkable.  No soft tissue gas to suggest infection.  Tachycardia resolved after patient received his home metoprolol.  Rest of patient's vitals are reassuring.  Given persistent pain in the right groin in the setting of recent surgery will order ultrasound to rule out DVT.  Vascular ultrasound will not be available until morning, due to patient's decreased mobility and transportation issues he would require repeat heart transport back home and then would have difficulty getting back to the hospital without taking an ambulance.  We will have patient board in the ED overnight to get DVT study in the morning after this he can be discharged home via [place].  Case management also consulted to help patient with mobility and potential home health for continued therapy after he had to leave inpatient rehab.  At shift change care signed out to PA [facility], he will let morning team know to follow-up on DVT study.  Final Clinical Impression(s) / ED Diagnoses Final diagnoses:  Fall, initial encounter  Amputation stump injury  Right hip pain  Acute pain of left knee    Rx / DC Orders ED Discharge Orders     None        Jacqlyn Larsen, PA-C 09/14/20 0111    Lorelle Gibbs, DO 09/14/20 2259

## 2020-09-13 NOTE — ED Provider Notes (Signed)
Emergency Medicine Provider Triage Evaluation Note  Patrick [name] , a 55 y.o. male  was evaluated in triage.  Pt complains of bleeding at recent op site, R LE stump. Fell x 2 this AM while transferring.  Review of Systems  Positive: bleeding Negative:   Physical Exam  BP (!) 138/96   Pulse (!) 123   Temp 99.7 F (37.6 C) (Oral)   Resp 18   SpO2 100%  Gen:   Awake, no distress   Resp:  Normal effort  MSK:   Moves extremities without difficulty, bleeding at mid-staple line R LE surgical wound Other:    Medical Decision Making  Medically screening exam initiated at 10:31 AM.  Appropriate orders placed.  Jolinda Croak was informed that the remainder of the evaluation will be completed by another provider, this initial triage assessment does not replace that evaluation, and the importance of remaining in the ED until their evaluation is complete.     Carlisle Cater, PA-C 09/13/20 1032    Lajean Saver, MD 09/15/20 1308

## 2020-09-13 NOTE — ED Notes (Signed)
Patient transported to X-ray 

## 2020-09-13 NOTE — Care Management (Signed)
ED RN CM received consult and spoke with patient concerning transitional care assistance. Patient reports leaving [facility] before all goals were met to secure his employment. Patient left and HH was supposed to be arranged. No one has contacted patient regarding [place] services as of yet.  CM explianed that patient does not met criteria for inpatient admission, and that patient's insurance will probably not approve another SNF stay patient has not had 60 well day as of yet, patient verbalized understanding. ED CM will arrange [facility] services  once patient is able to provide a secure address.  Updated EDP as well.

## 2020-09-13 NOTE — ED Triage Notes (Signed)
Pt from home for eval of possible wound problem after falling this morning. R BKA 2 weeks ago. Thinks when he fell this morning he popped a couple of the staples out. Is falling frequently at home since the surgery.

## 2020-09-13 NOTE — Care Management (Addendum)
ED RN CM received consult and spoke with patient concerning transitional care assistance. Patient reports leaving CIR before all goals were met to secure his employment. Patient left and HH was supposed to be arranged. No one has contacted patient regarding [place] services as of yet.  CM explianed that patient does not met criteria for inpatient admission, and that patient's insurance will not cover another rehab admission without 60 well days.  RNCM explained the level of care would be [facility] services. RNCM will send out referral to several [facility] agencies, and follow up with patient  with [address] Patient states that he has w/c and walker with bedside commode at home.  Updated EDP and Nurse patient can be transported home via [place].

## 2020-09-13 NOTE — Care Management (Signed)
Duplicate Note.

## 2020-09-14 ENCOUNTER — Emergency Department (HOSPITAL_BASED_OUTPATIENT_CLINIC_OR_DEPARTMENT_OTHER): Payer: Self-pay

## 2020-09-14 DIAGNOSIS — M7989 Other specified soft tissue disorders: Secondary | ICD-10-CM

## 2020-09-14 DIAGNOSIS — M79604 Pain in right leg: Secondary | ICD-10-CM

## 2020-09-14 NOTE — Progress Notes (Signed)
RLE venous duplex has been completed.  Preliminary results given to P[place], RN.  Results can be found under chart review under CV PROC. 09/14/2020 10:28 AM Dalbert Stillings RVT, RDMS

## 2020-09-14 NOTE — ED Notes (Signed)
[facility] called, patient is 12th on the list

## 2020-09-14 NOTE — ED Notes (Signed)
Pt reports no one at home til 6pm and he has no key, PER [facility] Dispatch must call at 6pm to place pt back on list as they had to remove entire call from their system.

## 2020-09-14 NOTE — ED Notes (Signed)
Chicken broth given to pt.

## 2020-09-14 NOTE — Discharge Instructions (Signed)
Please follow up with your surgeon as soon as possible. You do not have a DVT. Ice and elevate your leg. Get help right away if: Your stump is painful to the touch, or it is red, swollen, or hot. A bad smell develops around the stump. There is a sore on your stump that is not healing. Your stump is colder than the upper part of your limb. Skin on your stump turns gray or black. There is drainage coming from your stump.

## 2020-09-17 ENCOUNTER — Emergency Department (HOSPITAL_COMMUNITY): Payer: Medicaid Other

## 2020-09-17 ENCOUNTER — Encounter (HOSPITAL_COMMUNITY): Payer: Self-pay | Admitting: Student

## 2020-09-17 ENCOUNTER — Other Ambulatory Visit: Payer: Self-pay

## 2020-09-17 ENCOUNTER — Inpatient Hospital Stay (HOSPITAL_COMMUNITY)
Admission: EM | Admit: 2020-09-17 | Discharge: 2020-09-19 | DRG: 638 | Disposition: A | Discharge status: Home / Self Care | Payer: Medicaid Other | Attending: Internal Medicine | Admitting: Internal Medicine

## 2020-09-17 DIAGNOSIS — I10 Essential (primary) hypertension: Secondary | ICD-10-CM | POA: Diagnosis present

## 2020-09-17 DIAGNOSIS — L03116 Cellulitis of left lower limb: Secondary | ICD-10-CM | POA: Diagnosis present

## 2020-09-17 DIAGNOSIS — Z794 Long term (current) use of insulin: Secondary | ICD-10-CM | POA: Diagnosis not present

## 2020-09-17 DIAGNOSIS — E1151 Type 2 diabetes mellitus with diabetic peripheral angiopathy without gangrene: Secondary | ICD-10-CM | POA: Diagnosis present

## 2020-09-17 DIAGNOSIS — L97429 Non-pressure chronic ulcer of left heel and midfoot with unspecified severity: Secondary | ICD-10-CM | POA: Diagnosis present

## 2020-09-17 DIAGNOSIS — E119 Type 2 diabetes mellitus without complications: Secondary | ICD-10-CM

## 2020-09-17 DIAGNOSIS — I129 Hypertensive chronic kidney disease with stage 1 through stage 4 chronic kidney disease, or unspecified chronic kidney disease: Secondary | ICD-10-CM | POA: Diagnosis present

## 2020-09-17 DIAGNOSIS — E11628 Type 2 diabetes mellitus with other skin complications: Secondary | ICD-10-CM | POA: Diagnosis not present

## 2020-09-17 DIAGNOSIS — Z79899 Other long term (current) drug therapy: Secondary | ICD-10-CM

## 2020-09-17 DIAGNOSIS — E669 Obesity, unspecified: Secondary | ICD-10-CM | POA: Diagnosis present

## 2020-09-17 DIAGNOSIS — Z8616 Personal history of COVID-19: Secondary | ICD-10-CM | POA: Diagnosis not present

## 2020-09-17 DIAGNOSIS — E1165 Type 2 diabetes mellitus with hyperglycemia: Secondary | ICD-10-CM

## 2020-09-17 DIAGNOSIS — E11621 Type 2 diabetes mellitus with foot ulcer: Secondary | ICD-10-CM | POA: Diagnosis present

## 2020-09-17 DIAGNOSIS — M109 Gout, unspecified: Secondary | ICD-10-CM | POA: Diagnosis present

## 2020-09-17 DIAGNOSIS — E1122 Type 2 diabetes mellitus with diabetic chronic kidney disease: Secondary | ICD-10-CM | POA: Diagnosis present

## 2020-09-17 DIAGNOSIS — N1831 Chronic kidney disease, stage 3a: Secondary | ICD-10-CM | POA: Diagnosis present

## 2020-09-17 DIAGNOSIS — D631 Anemia in chronic kidney disease: Secondary | ICD-10-CM | POA: Diagnosis present

## 2020-09-17 DIAGNOSIS — L089 Local infection of the skin and subcutaneous tissue, unspecified: Secondary | ICD-10-CM | POA: Diagnosis present

## 2020-09-17 DIAGNOSIS — R6510 Systemic inflammatory response syndrome (SIRS) of non-infectious origin without acute organ dysfunction: Secondary | ICD-10-CM

## 2020-09-17 DIAGNOSIS — Z89511 Acquired absence of right leg below knee: Secondary | ICD-10-CM | POA: Diagnosis not present

## 2020-09-17 DIAGNOSIS — Z6838 Body mass index (BMI) 38.0-38.9, adult: Secondary | ICD-10-CM | POA: Diagnosis not present

## 2020-09-17 DIAGNOSIS — N183 Chronic kidney disease, stage 3 unspecified: Secondary | ICD-10-CM | POA: Diagnosis present

## 2020-09-17 DIAGNOSIS — D649 Anemia, unspecified: Secondary | ICD-10-CM | POA: Diagnosis present

## 2020-09-17 HISTORY — DX: Peripheral vascular disease, unspecified: I73.9

## 2020-09-17 LAB — URINALYSIS, ROUTINE W REFLEX MICROSCOPIC
Bacteria, UA: NONE SEEN
Bilirubin Urine: NEGATIVE
Glucose, UA: NEGATIVE mg/dL
Ketones, ur: NEGATIVE mg/dL
Leukocytes,Ua: NEGATIVE
Nitrite: NEGATIVE
Protein, ur: 100 mg/dL — AB
Specific Gravity, Urine: 1.015 (ref 1.005–1.030)
pH: 5 (ref 5.0–8.0)

## 2020-09-17 LAB — CBC WITH DIFFERENTIAL/PLATELET
Abs Immature Granulocytes: 0.07 10*3/uL (ref 0.00–0.07)
Basophils Absolute: 0.1 10*3/uL (ref 0.0–0.1)
Basophils Relative: 0 %
Eosinophils Absolute: 0.1 10*3/uL (ref 0.0–0.5)
Eosinophils Relative: 1 %
HCT: 25.9 % — ABNORMAL LOW (ref 39.0–52.0)
Hemoglobin: 8 g/dL — ABNORMAL LOW (ref 13.0–17.0)
Immature Granulocytes: 1 %
Lymphocytes Relative: 23 %
Lymphs Abs: 3.5 10*3/uL (ref 0.7–4.0)
MCH: 25.4 pg — ABNORMAL LOW (ref 26.0–34.0)
MCHC: 30.9 g/dL (ref 30.0–36.0)
MCV: 82.2 fL (ref 80.0–100.0)
Monocytes Absolute: 1.3 10*3/uL — ABNORMAL HIGH (ref 0.1–1.0)
Monocytes Relative: 9 %
Neutro Abs: 10.2 10*3/uL — ABNORMAL HIGH (ref 1.7–7.7)
Neutrophils Relative %: 66 %
Platelets: 518 10*3/uL — ABNORMAL HIGH (ref 150–400)
RBC: 3.15 MIL/uL — ABNORMAL LOW (ref 4.22–5.81)
RDW: 17.4 % — ABNORMAL HIGH (ref 11.5–15.5)
WBC: 15.2 10*3/uL — ABNORMAL HIGH (ref 4.0–10.5)
nRBC: 0 % (ref 0.0–0.2)

## 2020-09-17 LAB — PREALBUMIN: Prealbumin: 8.2 mg/dL — ABNORMAL LOW (ref 18–38)

## 2020-09-17 LAB — RESP PANEL BY RT-PCR (FLU A&B, COVID) ARPGX2
Influenza A by PCR: NEGATIVE
Influenza B by PCR: NEGATIVE
SARS Coronavirus 2 by RT PCR: NEGATIVE

## 2020-09-17 LAB — LACTIC ACID, PLASMA
Lactic Acid, Venous: 0.9 mmol/L (ref 0.5–1.9)
Lactic Acid, Venous: 0.7 mmol/L (ref 0.5–1.9)

## 2020-09-17 LAB — COMPREHENSIVE METABOLIC PANEL
ALT: 19 U/L (ref 0–44)
AST: 23 U/L (ref 15–41)
Albumin: 2.6 g/dL — ABNORMAL LOW (ref 3.5–5.0)
Alkaline Phosphatase: 117 U/L (ref 38–126)
Anion gap: 11 (ref 5–15)
BUN: 15 mg/dL (ref 6–20)
CO2: 24 mmol/L (ref 22–32)
Calcium: 8.6 mg/dL — ABNORMAL LOW (ref 8.9–10.3)
Chloride: 101 mmol/L (ref 98–111)
Creatinine, Ser: 1.63 mg/dL — ABNORMAL HIGH (ref 0.61–1.24)
GFR, Estimated: 50 mL/min — ABNORMAL LOW (ref >60)
Glucose, Bld: 167 mg/dL — ABNORMAL HIGH (ref 70–99)
Potassium: 3.7 mmol/L (ref 3.5–5.1)
Sodium: 136 mmol/L (ref 135–145)
Total Bilirubin: 0.5 mg/dL (ref 0.3–1.2)
Total Protein: 7 g/dL (ref 6.5–8.1)

## 2020-09-17 LAB — CBG MONITORING, ED
Glucose-Capillary: 144 mg/dL — ABNORMAL HIGH (ref 70–99)
Glucose-Capillary: 152 mg/dL — ABNORMAL HIGH (ref 70–99)

## 2020-09-17 LAB — PROTIME-INR
INR: 1.3 — ABNORMAL HIGH (ref 0.8–1.2)
Prothrombin Time: 16.3 seconds — ABNORMAL HIGH (ref 11.4–15.2)

## 2020-09-17 LAB — SEDIMENTATION RATE: Sed Rate: 95 mm/hr — ABNORMAL HIGH (ref 0–16)

## 2020-09-17 LAB — GLUCOSE, CAPILLARY
Glucose-Capillary: 127 mg/dL — ABNORMAL HIGH (ref 70–99)
Glucose-Capillary: 122 mg/dL — ABNORMAL HIGH (ref 70–99)

## 2020-09-17 LAB — C-REACTIVE PROTEIN: CRP: 19 mg/dL — ABNORMAL HIGH

## 2020-09-17 LAB — APTT: aPTT: 32 seconds (ref 24–36)

## 2020-09-17 MED ORDER — ENOXAPARIN SODIUM 40 MG/0.4ML IJ SOSY: 40.0000 mg | PREFILLED_SYRINGE | INTRAMUSCULAR | Status: DC

## 2020-09-17 MED ORDER — OXYCODONE HCL 5 MG PO TABS
10.0000 mg | ORAL_TABLET | ORAL | Status: DC | PRN
  Administered 2020-09-17: 15 mg via ORAL
  Administered 2020-09-18: 10 mg via ORAL
  Administered 2020-09-18 – 2020-09-19 (×3): 15 mg via ORAL
  Filled 2020-09-17: qty 3
  Filled 2020-09-17 (×3): qty 2
  Filled 2020-09-17 (×2): qty 3

## 2020-09-17 MED ORDER — ENOXAPARIN SODIUM 80 MG/0.8ML IJ SOSY
70.0000 mg | PREFILLED_SYRINGE | INTRAMUSCULAR | Status: DC
  Administered 2020-09-18 – 2020-09-19 (×2): 70 mg via SUBCUTANEOUS
  Filled 2020-09-17 (×2): qty 0.7

## 2020-09-17 MED ORDER — SODIUM CHLORIDE 0.9 % IV SOLN
2.0000 g | INTRAVENOUS | Status: DC
  Administered 2020-09-17 – 2020-09-18 (×2): 2 g via INTRAVENOUS
  Filled 2020-09-17 (×2): qty 20

## 2020-09-17 MED ORDER — HYDRALAZINE HCL 20 MG/ML IJ SOLN: 5.0000 mg | INTRAMUSCULAR | Status: DC | PRN

## 2020-09-17 MED ORDER — ACETAMINOPHEN 650 MG RE SUPP: 650.0000 mg | Freq: Four times a day (QID) | RECTAL | Status: DC | PRN

## 2020-09-17 MED ORDER — SODIUM CHLORIDE 0.9% FLUSH
3.0000 mL | Freq: Two times a day (BID) | INTRAVENOUS | Status: DC
  Administered 2020-09-17 – 2020-09-18 (×3): 3 mL via INTRAVENOUS

## 2020-09-17 MED ORDER — DOCUSATE SODIUM 100 MG PO CAPS
100.0000 mg | ORAL_CAPSULE | Freq: Two times a day (BID) | ORAL | Status: DC
  Filled 2020-09-17 (×5): qty 1

## 2020-09-17 MED ORDER — VANCOMYCIN HCL 2000 MG/400ML IV SOLN
2000.0000 mg | Freq: Once | INTRAVENOUS | Status: AC
  Administered 2020-09-17: 2000 mg via INTRAVENOUS
  Filled 2020-09-17: qty 400

## 2020-09-17 MED ORDER — POLYETHYLENE GLYCOL 3350 17 G PO PACK: 17.0000 g | PACK | Freq: Every day | ORAL | Status: DC | PRN

## 2020-09-17 MED ORDER — FERROUS SULFATE 325 (65 FE) MG PO TABS
325.0000 mg | ORAL_TABLET | Freq: Three times a day (TID) | ORAL | Status: DC
  Administered 2020-09-17 – 2020-09-19 (×6): 325 mg via ORAL
  Filled 2020-09-17 (×5): qty 1

## 2020-09-17 MED ORDER — GABAPENTIN 300 MG PO CAPS
300.0000 mg | ORAL_CAPSULE | Freq: Three times a day (TID) | ORAL | Status: DC
  Administered 2020-09-17 – 2020-09-19 (×6): 300 mg via ORAL
  Filled 2020-09-17 (×6): qty 1

## 2020-09-17 MED ORDER — ONDANSETRON HCL 4 MG/2ML IJ SOLN: 4.0000 mg | Freq: Four times a day (QID) | INTRAMUSCULAR | Status: DC | PRN

## 2020-09-17 MED ORDER — METOPROLOL TARTRATE 12.5 MG HALF TABLET
12.5000 mg | ORAL_TABLET | Freq: Two times a day (BID) | ORAL | Status: DC
  Administered 2020-09-17 – 2020-09-19 (×5): 12.5 mg via ORAL
  Filled 2020-09-17 (×5): qty 1

## 2020-09-17 MED ORDER — MORPHINE SULFATE (PF) 2 MG/ML IV SOLN
2.0000 mg | INTRAVENOUS | Status: DC | PRN
  Administered 2020-09-18: 2 mg via INTRAVENOUS
  Filled 2020-09-17: qty 1

## 2020-09-17 MED ORDER — SODIUM CHLORIDE 0.9 % IV SOLN: INTRAVENOUS | Status: DC

## 2020-09-17 MED ORDER — LACTATED RINGERS IV SOLN: INTRAVENOUS | Status: DC

## 2020-09-17 MED ORDER — SODIUM CHLORIDE 0.9 % IV BOLUS
1000.0000 mL | Freq: Once | INTRAVENOUS | Status: AC
Start: 2020-09-17 — End: 2020-09-17
  Administered 2020-09-17: 1000 mL via INTRAVENOUS

## 2020-09-17 MED ORDER — INSULIN GLARGINE 100 UNIT/ML SOLOSTAR PEN: 25.0000 [IU] | PEN_INJECTOR | Freq: Two times a day (BID) | SUBCUTANEOUS | Status: DC

## 2020-09-17 MED ORDER — HYDROCODONE-ACETAMINOPHEN 5-325 MG PO TABS
1.0000 | ORAL_TABLET | ORAL | Status: DC | PRN
  Administered 2020-09-17: 1 via ORAL
  Filled 2020-09-17: qty 1

## 2020-09-17 MED ORDER — SODIUM CHLORIDE 0.9 % IV SOLN
2.0000 g | Freq: Three times a day (TID) | INTRAVENOUS | Status: DC
  Administered 2020-09-17: 2 g via INTRAVENOUS
  Filled 2020-09-17: qty 2

## 2020-09-17 MED ORDER — ACETAMINOPHEN 325 MG PO TABS
650.0000 mg | ORAL_TABLET | Freq: Four times a day (QID) | ORAL | Status: DC | PRN
  Administered 2020-09-18: 650 mg via ORAL
  Filled 2020-09-17: qty 2

## 2020-09-17 MED ORDER — INSULIN ASPART 100 UNIT/ML IJ SOLN: 0.0000 [IU] | Freq: Every day | INTRAMUSCULAR | Status: DC

## 2020-09-17 MED ORDER — INSULIN ASPART 100 UNIT/ML IJ SOLN
0.0000 [IU] | Freq: Three times a day (TID) | INTRAMUSCULAR | Status: DC
  Administered 2020-09-17 – 2020-09-18 (×3): 2 [IU] via SUBCUTANEOUS

## 2020-09-17 MED ORDER — HYDROCERIN EX CREA
TOPICAL_CREAM | Freq: Two times a day (BID) | CUTANEOUS | Status: DC
  Filled 2020-09-17: qty 113

## 2020-09-17 MED ORDER — ONDANSETRON HCL 4 MG PO TABS: 4.0000 mg | ORAL_TABLET | Freq: Four times a day (QID) | ORAL | Status: DC | PRN

## 2020-09-17 MED ORDER — VANCOMYCIN HCL 2000 MG/400ML IV SOLN
2000.0000 mg | INTRAVENOUS | Status: DC
  Administered 2020-09-18 – 2020-09-19 (×2): 2000 mg via INTRAVENOUS
  Filled 2020-09-17 (×3): qty 400

## 2020-09-17 MED ORDER — INSULIN GLARGINE-YFGN 100 UNIT/ML ~~LOC~~ SOLN
25.0000 [IU] | Freq: Every day | SUBCUTANEOUS | Status: DC
  Administered 2020-09-17 – 2020-09-18 (×2): 25 [IU] via SUBCUTANEOUS
  Filled 2020-09-17 (×3): qty 0.25

## 2020-09-17 MED ORDER — METRONIDAZOLE 500 MG/100ML IV SOLN
500.0000 mg | Freq: Three times a day (TID) | INTRAVENOUS | Status: DC
  Administered 2020-09-17 – 2020-09-19 (×7): 500 mg via INTRAVENOUS
  Filled 2020-09-17 (×7): qty 100

## 2020-09-17 MED ORDER — ACETAMINOPHEN 325 MG PO TABS
650.0000 mg | ORAL_TABLET | Freq: Once | ORAL | Status: AC
  Administered 2020-09-17: 650 mg via ORAL
  Filled 2020-09-17: qty 2

## 2020-09-17 MED ORDER — TIZANIDINE HCL 4 MG PO TABS
4.0000 mg | ORAL_TABLET | Freq: Every day | ORAL | Status: DC | PRN
  Administered 2020-09-17 – 2020-09-18 (×3): 4 mg via ORAL
  Filled 2020-09-17 (×3): qty 1

## 2020-09-17 MED ORDER — BISACODYL 5 MG PO TBEC: 5.0000 mg | DELAYED_RELEASE_TABLET | Freq: Every day | ORAL | Status: DC | PRN

## 2020-09-17 NOTE — ED Notes (Signed)
Patient continues to have phantom pain after right BKA.  States he feels that he is able to wiggle toes on right foot.  Patient has blister on left foot, with serosanguinous drainage.

## 2020-09-17 NOTE — ED Notes (Signed)
CBG was 144

## 2020-09-17 NOTE — Progress Notes (Signed)
Pharmacy Antibiotic Note  Patrick [name] is a 55 y.o. male admitted on 09/17/2020 with sepsis secondary to cellulitis.  Pharmacy has been consulted for Vanc, Cefepime and Flagyl dosing.  Vancomycin 2000 mg IV Q 24 hrs. Goal AUC 400-550. Expected AUC: 480 SCr used: 1.63   Plan: Start Cefepime 2 gms IV q8hr Start Flagyl 500 mg IV q8hr Start Vanc 2000 mg IV q24hr Monitor renal function, clinical status, C&S and vanc levels as needed  Weight: (!) 144 kg (317 lb 7.4 oz)  Temp (24hrs), Avg:99.1 F (37.3 C), Min:98.5 F (36.9 C), Max:99.8 F (37.7 C)  Recent Labs  Lab 09/13/20 1606 09/13/20 2009 09/17/20 0115 09/17/20 0642  WBC 12.8*  --  15.2*  --   CREATININE 1.47*  --  1.63*  --   LATICACIDVEN  --  0.9  --  0.9    Estimated Creatinine Clearance: 81.3 mL/min (A) (by C-G formula based on SCr of 1.63 mg/dL (H)).    No Known Allergies  Antimicrobials this admission: Cefepime 7/30 >>  Vanc 7/30 >>  Flagyl 7/30 >>  Thank you for allowing pharmacy to be a part of this patient's care.  community-acquired pneumonia

## 2020-09-17 NOTE — Care Management (Signed)
Patient admitted for cellulitis, had recently had a BKA  and had left IP rehab due to job. Currently uninsured, sent to [facility] for resources.

## 2020-09-17 NOTE — H&P (Signed)
History and Physical    Patrick [name] PVV:[id number] DOB: [birth year] DOA: 09/17/2020  PCP: [facility] Consultants:  Sharol Given - orthopedics Patient coming from:  Home - lives in an extended stay hotel; NOK:  Significant other, Dois Davenport, [phone]  Chief Complaint: L foot wound  HPI: Patrick [name] is a 55 y.o. male with medical history significant of HTN; DM; gout; and PVD s/p R BKA 08/27/20 presenting with L foot wound. He lost his R foot - it started with a blister.   He saw a blister on his L foot 2 days ago and doesn't want to lose his foot so he came in.  He has periodic chills, no fever, "nothing major."  He had a R BKA 2 weeks ago and he went to inpatient rehab, discharged 1 week ago.  He returned to the ER on 7/26 after he stumbled and fell.  His short-term goal is to get a prosthesis and go back to living independently (he lived with friends after the divorce from his wife, but their house is not handicapped accessible).  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    ED Course: Diabetic foot wound with cellulitis, meets SIRS criteria.  Recent R BKA.  Falls, discomfort a few days ago with ER visit.  L foot blister, feels similar to RLE issues.  Chills without fever at home.  Foot is swollen with lower leg edema.  Rectal temp 99.3 but likely spiking.  2 blisters on the foot.  Tachy 120s.  WBC 15k.    Review of Systems: As per HPI; otherwise review of systems reviewed and negative.   Ambulatory Status:  Recent R BKA  COVID Vaccine Status:   Complete. No booster  Past Medical History:  Diagnosis Date   Diabetes mellitus    Gout    History of COVID-19 04/2019   Hypertension    PVD (peripheral vascular disease) ([place])    s/p R BKA    Past Surgical History:  Procedure Laterality Date   AMPUTATION Right 06/06/2020    Procedure: AMPUTATION 5TH RAY;  Surgeon: [name], MD;  Location: [place];  Service: Orthopedics;  Laterality: Right;   AMPUTATION Right 06/08/2020   Procedure: RIGHT 4TH RAY AMPUTATION;  Surgeon: [name], MD;  Location: [place];  Service: Orthopedics;  Laterality: Right;   AMPUTATION Right 08/27/2020   Procedure: AMPUTATION BELOW KNEE;  Surgeon: Jessy Oto, MD;  Location: [place];  Service: Orthopedics;  Laterality: Right;    Social History   Socioeconomic History   Marital status: Divorced    Spouse name: Not on file   Number of children: Not on file   Years of education: Not on file   Highest education level: Not on file  Occupational History   Occupation: logistics  Tobacco Use   Smoking status: Never   Smokeless tobacco: Never  Vaping Use   Vaping Use: Never used  Substance and Sexual Activity   Alcohol use: No   Drug use: No   Sexual activity: Not Currently  Other Topics Concern   Not on file  Social History Narrative   Not on file   Social Determinants of Health   Financial Resource Strain: Not on file  Food Insecurity: Not on file  Transportation Needs: Not on file  Physical Activity: Not on file  Stress: Not on file  Social Connections: Not on file  Intimate Partner Violence: Not on file    No Known Allergies  Family History  Problem Relation Age of Onset   Diabetes Neg Hx     Prior to Admission medications   Medication Sig Start Date End Date Taking? Authorizing Provider  acetaminophen (TYLENOL) 325 MG tablet Take 650 mg by mouth every 6 (six) hours as needed for headache, fever or moderate pain.    [provider]  allopurinol (ZYLOPRIM) 100 MG tablet Take 1 tablet (100 mg total) by mouth 2 (two) times daily. 09/08/20   Angiulli, Lavon Paganini, PA-C  ascorbic acid (VITAMIN C) 1000 MG tablet Take 1 tablet (1,000 mg total) by mouth daily. 09/08/20   Angiulli, Lavon Paganini, PA-C  docusate sodium (COLACE) 100 MG capsule Take 1 capsule (100 mg  total) by mouth daily. Patient taking differently: Take 100 mg by mouth daily as needed for mild constipation. 09/02/20   Nicole Kindred A, DO  ferrous sulfate 325 (65 FE) MG tablet Take 1 tablet (325 mg total) by mouth 3 (three) times daily after meals. 09/08/20   Angiulli, Lavon Paganini, PA-C  gabapentin (NEURONTIN) 300 MG capsule Take 1 capsule (300 mg total) by mouth 3 (three) times daily. 09/08/20   Angiulli, Lavon Paganini, PA-C  insulin aspart (NOVOLOG FLEXPEN) 100 UNIT/ML FlexPen Inject 4 Units into the skin 3 (three) times daily with meals. 09/08/20   Angiulli, Lavon Paganini, PA-C  insulin glargine (LANTUS SOLOSTAR) 100 UNIT/ML Solostar Pen Inject 25 Units into the skin 2 (two) times daily. 09/08/20   Angiulli, Lavon Paganini, PA-C  Insulin Pen Needle 32G X 4 MM MISC Use as directed 09/08/20   [name], Clide Deutscher, MD  Insulin Pen Needle 32G X 4 MM MISC Use as directed 09/09/20   Angiulli, Lavon Paganini, PA-C  metoprolol tartrate (LOPRESSOR) 25 MG tablet Take 0.5 tablets (12.5 mg total) by mouth 2 (two) times daily. 09/08/20   Angiulli, Lavon Paganini, PA-C  Multiple Vitamin (MULTIVITAMIN WITH MINERALS) TABS tablet Take 1 tablet by mouth daily. 09/02/20   Ezekiel Slocumb, DO  Oxycodone HCl 10 MG TABS Take 1-1.5 tablets (10-15 mg total) by mouth every 4 (four) hours as needed (pain score 7-10). 09/08/20   Angiulli, Lavon Paganini, PA-C  polyethylene glycol (MIRALAX / GLYCOLAX) 17 g packet Take 17 g by mouth daily. Patient not taking: Reported on 09/14/2020 09/08/20   Angiulli, Lavon Paganini, PA-C  Zinc Sulfate 220 (50 Zn) MG TABS Take 1 tablet (220 mg total) by mouth daily. 09/08/20   Cathlyn Parsons, PA-C    Physical Exam: Vitals:   09/17/20 1030 09/17/20 1044 09/17/20 1100 09/17/20 1330  BP: (!) 136/91  (!) 149/100 (!) 142/91  Pulse: (!) 114  (!) 113 (!) 109  Resp: 16  16 (!) 21  Temp:      TempSrc:      SpO2: 99%  100% 99%  Weight:  (!) 144 kg    Height:  _0  (1.93 m)       General:  Appears calm and comfortable and is in  NAD Eyes:  PERRL, EOMI, normal lids, iris ENT:  grossly normal hearing, lips & tongue, mmm Neck:  no LAD, masses or thyromegaly Cardiovascular:  RR with mild tachycardia, no m/r/g. Respiratory:   CTA bilaterally with no wheezes/rales/rhonchi.  Normal to mildly increased respiratory effort. Abdomen:  soft, NT, ND Skin:  L heel ulceration that  is actively draining serous discharge; also with L lower leg statis dermatitis and lichenification   Musculoskeletal:  grossly normal tone BUE/BLE, good ROM, no bony abnormality; s/p R BKA, stump appears C/D/I with staples in place   Psychiatric:  blunted mood and affect, speech fluent and appropriate, AOx3 Neurologic:  CN 2-12 grossly intact, moves all extremities in coordinated fashion    Radiological Exams on Admission: Independently reviewed - see discussion in A/P where applicable  DG Chest 2 View  Result Date: 09/17/2020 CLINICAL DATA:  Chills for 1 day.  Blister on right lower leg. EXAM: CHEST - 2 VIEW COMPARISON:  09/13/2020 FINDINGS: Mild cardiac enlargement. No pleural effusion or edema. No airspace opacities identified. Thoracic spondylosis noted. IMPRESSION: No acute cardiopulmonary abnormalities. Electronically Signed   By: Kerby Moors M.D.   On: 09/17/2020 07:41   DG Tibia/Fibula Right  Result Date: 09/17/2020 CLINICAL DATA:  Chills.  Recent amputation. EXAM: RIGHT TIBIA AND FIBULA - 2 VIEW COMPARISON:  A 09/13/2020 FINDINGS: There is scratch set signs of recent below the knee amputation. There is a small suprapatellar joint effusion with a few loose bodies in the suprapatellar joint space. No signs of acute fracture or dislocation. No focal bone erosions identified. Diffuse soft tissue scratch set mild diffuse soft tissue swelling. IMPRESSION: 1. Small suprapatellar joint effusion. 2. No focal bone erosions identified. 3. Soft tissue swelling. Electronically Signed   By: Kerby Moors M.D.   On: 09/17/2020 07:44   DG Foot Complete  Left  Result Date: 09/17/2020 CLINICAL DATA:  Left foot pain EXAM: LEFT FOOT - COMPLETE 3+ VIEW COMPARISON:  None. FINDINGS: Pes planus deformity. 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 No acute fracture or dislocation. Small plantar calcaneal spur. Mild diffuse soft tissue swelling of the left foot. Extensive artifact related to the patient's overlying sock noted. IMPRESSION: Degenerative changes.  No acute fracture or dislocation. Electronically Signed   By: Fidela Salisbury MD   On: 09/17/2020 01:41    EKG: not done   Labs on Admission: I have personally reviewed the available labs and imaging studies at the time of the admission.  Pertinent labs:   Glucose 167 Albumin 2.6 WBC 15.2 Hgb 8.0 Platelets 518 Lactate 0.9 COVID/flu negative  Assessment/Plan Principal Problem:   Diabetic foot infection (HCC) Active Problems:   Insulin-requiring or dependent type II diabetes mellitus (HCC)   GOUT   OBESITY   HYPERTENSION, BENIGN ESSENTIAL   CKD (chronic kidney disease) stage 3, GFR 30-59 ml/min (HCC)   Anemia   S/P BKA (below knee amputation) unilateral, right (HCC)   Diabetic foot infection -Patient with R BKA 2 weeks ago presenting with L heel wound and SIRS physiology -Patient had leukocytosis, tachycardia, and tachypnea on admission without apparent acute organ failure to suggest sepsis -Foot ulcer is present and draining and now the patient has developed surrounding cellulitis -Foot xray unremarkable; while he may just have cellulitis, will check MRI to r/o osteo -Negative lactate -Will treat with IV antibiotics Rocephin/Flagyl/Vancomycin  -Will need ortho consultation if MRI indicates osteo -Patient has h/o PVD and so I have ordered ABIs in case revascularization may be indicated -LE  wound order set utilized including labs (CRP, ESR, A1c, prealbumin, HIV, and blood cultures) and consults (diabetes coordinator; peripheral vascular navigator; TOC team; wound care; and nutrition)   Recent  BKA -He is due for outpatient ortho f/u regarding his R BKA and likely needs staple removal soon -Appears to be healing well -Continue Oxycodone, Zanaflex, Neurontin  HTN -Continue Lopressor -Hold losartan due to renal dysfunction -He apparently takes Hyzaar when he runs out of Cozaar; this is suboptimal  DM -Will check A1c -Continue Lantus -Cover with moderate-scale SSI   Gout -Only takes Allopurinol prn; should consider standing doses -Should not take indocin so this was discontinued  Stage 3a CKD -Appears to be at/near baseline -It is likely reasonable to resume ARB soon -will recheck in AM  Anemia -Likely associated with prior blood loss and renal disease -Continue TID FeSO4  Obesity -Body mass index is 38.64 kg/m..  -Weight loss should be encouraged -Outpatient PCP/bariatric medicine/bariatric surgery f/u encouraged      Note: This patient has been tested and is negative for the novel coronavirus COVID-19. The patient has been fully vaccinated against COVID-19.   Level of care: Telemetry Medical DVT prophylaxis:  Lovenox Code Status:  Full - confirmed with patient Family Communication: None present Disposition Plan:  The patient is from: home  Anticipated d/c is to: home without [facility] services   Anticipated d/c date will depend on clinical response to treatment, likely 2-3 days   Patient is currently: acutely ill Consults called: diabetes coordinator; peripheral vascular navigator; TOC team; wound care; and nutrition  Admission status:  Admit - It is my clinical opinion that admission to INPATIENT is reasonable and necessary because of the expectation that this patient will require hospital care that crosses at least 2 midnights to treat this condition based on  the medical complexity of the problems presented.  Given the aforementioned information, the predictability of an adverse outcome is felt to be significant.    Karmen Bongo MD [facility]   How to contact the [facility] Attending or Consulting provider [place] or covering provider during after hours [place], for this patient?  Check the care team in [facility] and look for a) attending/consulting TR[facility] provider listed and b) the [facility] team listed Log into [email or web address] and use [place]'s universal password to access. If you do not have the password, please contact the hospital operator. Locate the [facility] provider you are looking for under [facility] and page to a number that you can be directly reached. If you still have difficulty reaching the provider, please page the [facility] (Director on Call) for the Hospitalists listed on amion for assistance.   09/17/2020, 2:36 PM

## 2020-09-17 NOTE — ED Triage Notes (Signed)
Pt BIB [facility] from home c/o blister left heal. First noticed today. HX DM. Triage HR 123. C/o chills at home, denies fevers. Currently taking ABX right lower leg amputation

## 2020-09-17 NOTE — Consult Note (Signed)
WOC Nurse Consult Note: Reason for Consult:Left foot, plantar aspect blisters Wound type:neuropathic Pressure Injury POA: N/A Measurement: To be ordered by  Wound bed: ruptured blister on heel, plantar aspect, intact blister on midfoot Drainage (amount, consistency, odor) dry Periwound: Patient's periwound tissues on feet are dry. Dressing procedure/placement/frequency: I have provided Nursing with guidance for the care of the surgical incision on the right amputation site, painting twice daily with a betadine swabstick and allowing to air-dry (no dressing). Elevation on a pillow while in bed. Left heel with two blisters, one at heel (ruptured) and one at midfoot (intact, serum filled). Surrounding tissue is dry. I will provide Nursing with guidance for moisturizing after cleansing and for covering the lesions with a xeroform gauze (antimicrobial, nonadherent). The dressings will be covered and held in place with roll gauze, then foot will be placed into a pressure redistribution heel boot. A sacral foam will be placed for prophylactic purposes and turning and repositioning are in place.  [place] nursing team will not follow, but will remain available to this patient, the nursing and medical teams.  Please re-consult if needed. Thanks, Maudie Flakes, MSN, RN, [place], Arther Abbott  Pager# [phone]

## 2020-09-17 NOTE — Progress Notes (Signed)
Patient admitted from ED with elevated blood pressure and heart rate. Vital signs taken at rest. Yellow MEWS vital signs initiated.   09/17/20 1615  Assess: MEWS Score  Temp 98.4 F (36.9 C)  BP (!) 158/98  Pulse Rate (!) 112  Resp 20  Level of Consciousness Alert  SpO2 100 %  O2 Device Room Air  Assess: MEWS Score  MEWS Temp 0  MEWS Systolic 0  MEWS Pulse 2  MEWS RR 0  MEWS LOC 0  MEWS Score 2  MEWS Score Color Yellow  Assess: if the MEWS score is Yellow or Red  Were vital signs taken at a resting state? Yes  Focused Assessment No change from prior assessment  Early Detection of Sepsis Score *See Row Information* Low  MEWS guidelines implemented *See Row Information* Yes  Treat  Pain Scale 0-10  Pain Score 3  Pain Type Acute pain  Pain Location Foot  Pain Orientation Left  Pain Descriptors / Indicators Aching  Pain Frequency Intermittent  Pain Onset Gradual  Patients Stated Pain Goal 0  Take Vital Signs  Increase Vital Sign Frequency  Yellow: Q 2hr X 2 then Q 4hr X 2, if remains yellow, continue Q 4hrs  Escalate  MEWS: Escalate Yellow: discuss with charge nurse/RN and consider discussing with provider and RRT  Notify: Charge Nurse/RN  Name of Charge Nurse/RN Notified Roj  Date Charge Nurse/RN Notified 09/17/20  Time Charge Nurse/RN Notified 1625  Document  Patient Outcome Stabilized after interventions  Progress note created (see row info) Yes

## 2020-09-17 NOTE — ED Notes (Addendum)
This RN unable to get back to pts room to start antibiotics until now. Repeat troponin collected and sent to lab

## 2020-09-17 NOTE — ED Notes (Signed)
Pt a difficult stick, previous RN unable to obtain IV access. This RN successful, fluids started

## 2020-09-17 NOTE — ED Provider Notes (Signed)
[facility] EMERGENCY DEPARTMENT Provider Note   CSN: [id number] Arrival date & time: 09/17/20  0051     History Chief Complaint  Patient presents with   Blister    Left Foot    Patrick [name] is a 55 y.o. male with a hx of hypertension, T2DM, CKD, & right lower extremity BKA who presents to the ED with complaints of left foot wound that he noted 1.5 days prior. Patient states he noted a blister to his left heel which subsequently popped, it is not particularly painful with his hx of neuropathy, no alleviating/aggravating factors. He was concerned as his right BKA started with a blister therefore he wanted to get this one checked out. He has had some mild chills. Denies fever, URI sxs, cough, dyspnea, chest pain, abdominal pain, or vomiting.   HPI     Past Medical History:  Diagnosis Date   Diabetes mellitus    Gout    History of COVID-19 04/2019   Hypertension     Patient Active Problem List   Diagnosis Date Noted   Right below-knee amputee ([place]) 09/01/2020   ABLA (acute blood loss anemia) 09/01/2020   Anemia 08/27/2020   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 ([place]) 07/20/2020   Dehiscence of amputation stump ([place]) 07/20/2020   Cutaneous abscess of right foot    Osteomyelitis of right foot ([facility])    Severe protein-calorie malnutrition ([place])    Sepsis due to cellulitis ([place]) 06/04/2020   Diabetic foot ulcer ([place]) 06/04/2020   Diabetic foot infection ([place]) 06/04/2020   CKD (chronic kidney disease) stage 3, GFR 30-59 ml/min ([place]) 06/04/2020   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   Acute respiratory disease due to COVID-19 virus 05/03/2019   Renal insufficiency 05/03/2019   GOUT 01/27/2010   OBESITY 01/27/2010   Insulin-requiring or dependent type II diabetes mellitus ([place]) 02/20/2008   HYPERTENSION, BENIGN ESSENTIAL 02/20/2008    Past Surgical History:  Procedure Laterality Date   AMPUTATION Right 06/06/2020   Procedure:  AMPUTATION 5TH RAY;  Surgeon: [name], MD;  Location: [facility];  Service: Orthopedics;  Laterality: Right;   AMPUTATION Right 06/08/2020   Procedure: RIGHT 4TH RAY AMPUTATION;  Surgeon: [name], MD;  Location: [facility];  Service: Orthopedics;  Laterality: Right;   AMPUTATION Right 08/27/2020   Procedure: AMPUTATION BELOW KNEE;  Surgeon: Jessy Oto, MD;  Location: [facility];  Service: Orthopedics;  Laterality: Right;       Family History  Problem Relation Age of Onset   Diabetes Neg Hx     Social History   Tobacco Use   Smoking status: Never   Smokeless tobacco: Never  Vaping Use   Vaping Use: Never used  Substance Use Topics   Alcohol use: No   Drug use: No    Home Medications Prior to Admission medications   Medication Sig Start Date End Date Taking? Authorizing Provider  acetaminophen (TYLENOL) 325 MG tablet Take 650 mg by mouth every 6 (six) hours as needed for headache, fever or moderate pain.    [provider]  allopurinol (ZYLOPRIM) 100 MG tablet Take 1 tablet (100 mg total) by mouth 2 (two) times daily. 09/08/20   Angiulli, Lavon Paganini, PA-C  ascorbic acid (VITAMIN C) 1000 MG tablet Take 1 tablet (1,000 mg total) by mouth daily. 09/08/20   Angiulli, Lavon Paganini, PA-C  docusate sodium (COLACE) 100 MG capsule Take 1 capsule (100 mg total)  by mouth daily. Patient taking differently: Take 100 mg by mouth daily as needed for mild constipation. 09/02/20   Nicole Kindred A, DO  ferrous sulfate 325 (65 FE) MG tablet Take 1 tablet (325 mg total) by mouth 3 (three) times daily after meals. 09/08/20   Angiulli, Lavon Paganini, PA-C  gabapentin (NEURONTIN) 300 MG capsule Take 1 capsule (300 mg total) by mouth 3 (three) times daily. 09/08/20   Angiulli, Lavon Paganini, PA-C  insulin aspart (NOVOLOG FLEXPEN) 100 UNIT/ML FlexPen Inject 4 Units into the skin 3 (three) times daily with meals. 09/08/20   Angiulli, Lavon Paganini, PA-C  insulin glargine (LANTUS SOLOSTAR) 100 UNIT/ML Solostar Pen Inject 25  Units into the skin 2 (two) times daily. 09/08/20   Angiulli, Lavon Paganini, PA-C  Insulin Pen Needle 32G X 4 MM MISC Use as directed 09/08/20   [name], Clide Deutscher, MD  Insulin Pen Needle 32G X 4 MM MISC Use as directed 09/09/20   Angiulli, Lavon Paganini, PA-C  metoprolol tartrate (LOPRESSOR) 25 MG tablet Take 0.5 tablets (12.5 mg total) by mouth 2 (two) times daily. 09/08/20   Angiulli, Lavon Paganini, PA-C  Multiple Vitamin (MULTIVITAMIN WITH MINERALS) TABS tablet Take 1 tablet by mouth daily. 09/02/20   Ezekiel Slocumb, DO  Oxycodone HCl 10 MG TABS Take 1-1.5 tablets (10-15 mg total) by mouth every 4 (four) hours as needed (pain score 7-10). 09/08/20   Angiulli, Lavon Paganini, PA-C  polyethylene glycol (MIRALAX / GLYCOLAX) 17 g packet Take 17 g by mouth daily. Patient not taking: Reported on 09/14/2020 09/08/20   Angiulli, Lavon Paganini, PA-C  Zinc Sulfate 220 (50 Zn) MG TABS Take 1 tablet (220 mg total) by mouth daily. 09/08/20   Angiulli, Lavon Paganini, PA-C    Allergies    Patient has no known allergies.  Review of Systems   Review of Systems  Constitutional:  Positive for chills. Negative for fever.  HENT:  Negative for congestion, ear pain and sore throat.   Respiratory:  Negative for cough and shortness of breath.   Cardiovascular:  Negative for chest pain.  Gastrointestinal:  Negative for abdominal pain and vomiting.  Genitourinary:  Negative for dysuria.  Skin:  Positive for wound.  Neurological:  Negative for syncope.  All other systems reviewed and are negative.  Physical Exam Updated Vital Signs BP (!) 151/100 (BP Location: Left Arm)   Pulse (!) 112   Temp 98.5 F (36.9 C) (Oral)   Resp 18   SpO2 100%   Physical Exam Vitals and nursing note reviewed.  Constitutional:      General: He is not in acute distress.    Appearance: He is not toxic-appearing.  HENT:     Head: Normocephalic and atraumatic.  Eyes:     Pupils: Pupils are equal, round, and reactive to light.  Cardiovascular:     Rate and  Rhythm: Regular rhythm. Tachycardia present.     Comments: RLE 2+ DP pulse.  Pulmonary:     Effort: Pulmonary effort is normal.     Breath sounds: Normal breath sounds.  Abdominal:     General: There is no distension.     Tenderness: There is no abdominal tenderness. There is no guarding or rebound.  Musculoskeletal:     Cervical back: Neck supple. No rigidity.     Comments: Lower extremities: RLE with BKA, staples in place, no purulent drainage, nontender. LLE: blister intact to the lateral mid plantar aspect of the foot as well as another wound  to the heel. Mildly erythematous. No purulent drainage. No fluctuance. LLE with pitting edema to the lower leg. No calf tenderness.   Skin:    General: Skin is warm and dry.     Comments: Mildly hot to the touch.   Neurological:     Mental Status: He is alert.       ED Results / Procedures / Treatments   Labs (all labs ordered are listed, but only abnormal results are displayed) Labs Reviewed  CBC WITH DIFFERENTIAL/PLATELET - Abnormal; Notable for the following components:      Result Value   WBC 15.2 (*)    RBC 3.15 (*)    Hemoglobin 8.0 (*)    HCT 25.9 (*)    MCH 25.4 (*)    RDW 17.4 (*)    Platelets 518 (*)    Neutro Abs 10.2 (*)    Monocytes Absolute 1.3 (*)    All other components within normal limits  COMPREHENSIVE METABOLIC PANEL - Abnormal; Notable for the following components:   Glucose, Bld 167 (*)    Creatinine, Ser 1.63 (*)    Calcium 8.6 (*)    Albumin 2.6 (*)    GFR, Estimated 50 (*)    All other components within normal limits  RESP PANEL BY RT-PCR (FLU A&B, COVID) [id number]  CULTURE, BLOOD (ROUTINE X 2)  CULTURE, BLOOD (ROUTINE X 2)  CBG MONITORING, ED    EKG None  Radiology DG Chest 2 View  Result Date: 09/17/2020 CLINICAL DATA:  Chills for 1 day.  Blister on right lower leg. EXAM: CHEST - 2 VIEW COMPARISON:  09/13/2020 FINDINGS: Mild cardiac enlargement. No pleural effusion or edema. No airspace  opacities identified. Thoracic spondylosis noted. IMPRESSION: No acute cardiopulmonary abnormalities. Electronically Signed   By: Kerby Moors M.D.   On: 09/17/2020 07:41   DG Tibia/Fibula Right  Result Date: 09/17/2020 CLINICAL DATA:  Chills.  Recent amputation. EXAM: RIGHT TIBIA AND FIBULA - 2 VIEW COMPARISON:  A 09/13/2020 FINDINGS: There is scratch set signs of recent below the knee amputation. There is a small suprapatellar joint effusion with a few loose bodies in the suprapatellar joint space. No signs of acute fracture or dislocation. No focal bone erosions identified. Diffuse soft tissue scratch set mild diffuse soft tissue swelling. IMPRESSION: 1. Small suprapatellar joint effusion. 2. No focal bone erosions identified. 3. Soft tissue swelling. Electronically Signed   By: Kerby Moors M.D.   On: 09/17/2020 07:44   DG Foot Complete Left  Result Date: 09/17/2020 CLINICAL DATA:  Left foot pain EXAM: LEFT FOOT - COMPLETE 3+ VIEW COMPARISON:  None. FINDINGS: Pes planus deformity. 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 No acute fracture or dislocation. Small plantar calcaneal spur. Mild diffuse soft tissue swelling of the left foot. Extensive artifact related to the patient's overlying sock noted. IMPRESSION: Degenerative changes.  No acute fracture or dislocation. Electronically Signed   By: Fidela Salisbury MD   On: 09/17/2020 01:41    Procedures Procedures   Medications Ordered in ED Medications - No data to display  ED Course  I have reviewed the triage vital signs and the nursing notes.  Pertinent labs & imaging results that were available during my care of the patient were reviewed by me and considered in my medical decision making (see chart for details).  MDM Rules/Calculators/A&P                            Patient presents to the ED with complaints of left foot wound x 1.5 days. Nontoxic, vitals w/ tachycardia to the 120s, rectal temp is 99.3 currently, patient is quite hot to the touch.   Additional history obtained:  Additional history obtained from chart review & nursing note review. Recent ED visit for fall- RLE DVT study negative. No findings of infection at that time, blood cxs negative.   Lab Tests:  I reviewed and interpreted labs, which included:  CBC: Leukocytosis w/ left shift that is uptrending. Anemia similar to prior. Thrombocytosis.  CMP: creatinine similar to prior.  COVID/flu: Negative.   Imaging Studies ordered:  I ordered imaging studies which included right lower leg x-ray, CXR, and L foot x-ray ordered by triage, I independently reviewed, formal radiology impression shows:  CXR: No acute cardiopulmonary abnormalities. L foot x-ray: Degenerative changes.  No acute fracture or dislocation R tib/fib x-ray: 1. Small suprapatellar joint effusion. 2. No focal bone erosions identified. 3. Soft tissue swelling.  ED Course:  Patient with findings suggestive of cellulitis of the left foot, meets SIRS criteria, discussed w/ attending- start abx and will discuss w/ hospitalist service for admission.   08:44: CONSULT: Discussed with hospitliast Dr. Lorin Mercy- accepts admission.   Portions of this note were generated with [employer]. Dictation errors may occur despite best attempts at proofreading.  Final Clinical Impression(s) / ED Diagnoses Final diagnoses:  Cellulitis in diabetic foot ([place])  SIRS (systemic inflammatory response syndrome) [facility])    Rx / DC Orders ED Discharge Orders     None        Amaryllis Dyke, PA-C 09/17/20 1154    Palumbo, April, MD 09/17/20 2357

## 2020-09-17 NOTE — ED Provider Notes (Signed)
Emergency Medicine Provider Triage Evaluation Note  Patrick [name] , a 55 y.o. male  was evaluated in triage.  Pt complains of left foot pain.  Onset today.  Reports persistent swelling.  Denies fever, chills, or vomiting.  Feel sore on the heel.  Review of Systems  Positive: Foot swelling, foot pain Negative: Fever, vomiting  Physical Exam  There were no vitals taken for this visit. Gen:   Awake, no distress   Resp:  Normal effort  MSK:   Moves extremities without difficulty  Other:    Medical Decision Making  Medically screening exam initiated at 12:58 AM.  Appropriate orders placed.  Jolinda Croak was informed that the remainder of the evaluation will be completed by another provider, this initial triage assessment does not replace that evaluation, and the importance of remaining in the ED until their evaluation is complete.  Foot pain   Montine Circle, PA-C 09/17/20 [id number]    Randal Buba, April, MD 09/17/20 0230

## 2020-09-17 NOTE — ED Notes (Signed)
Attempted IV x 2. Unsuccessful. 

## 2020-09-18 ENCOUNTER — Encounter (HOSPITAL_COMMUNITY): Payer: Self-pay | Admitting: Internal Medicine

## 2020-09-18 ENCOUNTER — Inpatient Hospital Stay (HOSPITAL_COMMUNITY): Payer: Medicaid Other

## 2020-09-18 DIAGNOSIS — L0390 Cellulitis, unspecified: Secondary | ICD-10-CM

## 2020-09-18 LAB — CBC
HCT: 26.1 % — ABNORMAL LOW (ref 39.0–52.0)
Hemoglobin: 8 g/dL — ABNORMAL LOW (ref 13.0–17.0)
MCH: 25.5 pg — ABNORMAL LOW (ref 26.0–34.0)
MCHC: 30.7 g/dL (ref 30.0–36.0)
MCV: 83.1 fL (ref 80.0–100.0)
Platelets: 476 10*3/uL — ABNORMAL HIGH (ref 150–400)
RBC: 3.14 MIL/uL — ABNORMAL LOW (ref 4.22–5.81)
RDW: 17.3 % — ABNORMAL HIGH (ref 11.5–15.5)
WBC: 12.8 10*3/uL — ABNORMAL HIGH (ref 4.0–10.5)
nRBC: 0 % (ref 0.0–0.2)

## 2020-09-18 LAB — BASIC METABOLIC PANEL
Anion gap: 9 (ref 5–15)
BUN: 12 mg/dL (ref 6–20)
CO2: 24 mmol/L (ref 22–32)
Calcium: 8.4 mg/dL — ABNORMAL LOW (ref 8.9–10.3)
Chloride: 103 mmol/L (ref 98–111)
Creatinine, Ser: 1.43 mg/dL — ABNORMAL HIGH (ref 0.61–1.24)
GFR, Estimated: 59 mL/min — ABNORMAL LOW (ref >60)
Glucose, Bld: 138 mg/dL — ABNORMAL HIGH (ref 70–99)
Potassium: 4.2 mmol/L (ref 3.5–5.1)
Sodium: 136 mmol/L (ref 135–145)

## 2020-09-18 LAB — GLUCOSE, CAPILLARY
Glucose-Capillary: 141 mg/dL — ABNORMAL HIGH (ref 70–99)
Glucose-Capillary: 94 mg/dL (ref 70–99)
Glucose-Capillary: 143 mg/dL — ABNORMAL HIGH (ref 70–99)
Glucose-Capillary: 116 mg/dL — ABNORMAL HIGH (ref 70–99)

## 2020-09-18 LAB — CULTURE, BLOOD (ROUTINE X 2)
Culture: NO GROWTH
Culture: NO GROWTH
Special Requests: ADEQUATE
Special Requests: ADEQUATE

## 2020-09-18 MED ORDER — GADOBUTROL 1 MMOL/ML IV SOLN
10.0000 mL | Freq: Once | INTRAVENOUS | Status: AC | PRN
  Administered 2020-09-18: 10 mL via INTRAVENOUS

## 2020-09-18 NOTE — Progress Notes (Signed)
VASCULAR LAB    ABIs have been performed.  See CV proc for preliminary results.   Sharalee Witman, RVT 09/18/2020, 11:20 AM

## 2020-09-18 NOTE — Progress Notes (Signed)
   09/18/20 1535  Assess: MEWS Score  Temp 99.3 F (37.4 C)  BP (!) 154/83  Pulse Rate (!) 112  Resp 20  Level of Consciousness Alert  SpO2 98 %  O2 Device Room Air  Assess: MEWS Score  MEWS Temp 0  MEWS Systolic 0  MEWS Pulse 2  MEWS RR 0  MEWS LOC 0  MEWS Score 2  MEWS Score Color Yellow  Assess: if the MEWS score is Yellow or Red  Were vital signs taken at a resting state? Yes  Focused Assessment No change from prior assessment  Early Detection of Sepsis Score *See Row Information* Low  MEWS guidelines implemented *See Row Information* No, vital signs rechecked  Treat  Pain Scale 0-10  Pain Score 0  Escalate  MEWS: Escalate Yellow: discuss with charge nurse/RN and consider discussing with provider and RRT  Notify: Charge Nurse/RN  Name of Charge Nurse/RN Notified Rodena Piety, RN  Date Charge Nurse/RN Notified 09/18/20  Time Charge Nurse/RN Notified 1730  Notify: Provider  Provider Name/Title Griffin Basil MD  Date Provider Notified 09/18/20  Time Provider Notified 1540  Notification Type Page  Notification Reason Other (Comment) (Per MEWS protocol)  Provider response No new orders  Date of Provider Response 09/18/20  Time of Provider Response [id number]  Provider notified, no new new orders, no PRNs to give. Pain assessed.

## 2020-09-18 NOTE — Progress Notes (Signed)
[facility] PROGRESS NOTE    Patrick [name]  [id number] DOB: 1966/01/21 DOA: 09/17/2020 PCP: [facility]      Brief Narrative:  This  is a 55 y.o. 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     Past Medical History:  Diagnosis Date   Diabetes mellitus    Gout    History of COVID-19 04/2019   Hypertension    PVD (peripheral vascular disease) ([place])    s/p R BKA      Assessment & Plan:  Left diabetic foot infection Currently on ceftriaxone, metronidazole and vancomycin.  MRI of the left foot is negative for osteomyelitis or abscess..    Continue with local wound care.  Hypertension Continue with current medications.  Diabetes. Continue with insulin glargine as well as corrective insulin.  Chronic kidney disease stage III. Stable.  History of gout. Asymptomatic.       docusate sodium  100 mg Oral BID   enoxaparin (LOVENOX) injection  70 mg Subcutaneous Q24H   ferrous sulfate  325 mg Oral TID PC   gabapentin  300 mg Oral TID   hydrocerin   Topical BID   insulin aspart  0-15 Units Subcutaneous TID WC   insulin aspart  0-5 Units Subcutaneous QHS   insulin glargine-yfgn  25 Units Subcutaneous QHS   metoprolol tartrate  12.5 mg Oral BID   sodium chloride flush  3 mL Intravenous Q12H     Current Meds  Medication Sig   acetaminophen (TYLENOL) 500 MG tablet Take 1,000 mg by mouth every 6 (six) hours as needed for mild pain or headache.   allopurinol (ZYLOPRIM) 100 MG tablet Take 1 tablet (100 mg total) by mouth 2 (two) times daily. (Patient taking differently: Take 100 mg by mouth 2 (two) times daily as needed (gout flare up).)   ascorbic acid (VITAMIN C) 1000 MG tablet Take 1 tablet (1,000 mg total) by mouth daily.   docusate sodium (COLACE) 100 MG capsule Take 1 capsule (100 mg total) by mouth daily. (Patient  taking differently: Take 100 mg by mouth daily as needed for mild constipation.)   ferrous sulfate 325 (65 FE) MG tablet Take 1 tablet (325 mg total) by mouth 3 (three) times daily after meals.   gabapentin (NEURONTIN) 300 MG capsule Take 1 capsule (300 mg total) by mouth 3 (three) times daily.   insulin aspart (NOVOLOG FLEXPEN) 100 UNIT/ML FlexPen Inject 4 Units into the skin 3 (three) times daily with meals.   insulin glargine (LANTUS SOLOSTAR) 100 UNIT/ML Solostar Pen Inject 25 Units into the skin 2 (two) times daily.   losartan (COZAAR) 100 MG tablet Take 100 mg by mouth daily.   losartan-hydrochlorothiazide (HYZAAR) 100-25 MG tablet Take 1 tablet by mouth daily.   metoprolol tartrate (LOPRESSOR) 25 MG tablet Take 0.5 tablets (12.5 mg total) by mouth 2 (two) times daily.   Oxycodone HCl 10 MG TABS Take 1-1.5 tablets (10-15 mg total) by mouth every 4 (four) hours as needed (pain score 7-10).   tiZANidine (ZANAFLEX) 4 MG capsule Take 4 mg by mouth daily as needed for muscle spasms.   Zinc Sulfate 220 (50 Zn) MG TABS Take 1 tablet (220 mg total) by mouth daily.   [DISCONTINUED] indomethacin (INDOCIN) 25 MG capsule Take 50 mg by mouth daily as needed (gout flare up).  Disposition: Status is: Inpatient  Remains inpatient appropriate because:IV treatments appropriate due to intensity of illness or inability to take PO    MDM: The below labs and imaging reports were reviewed and summarized above.  Medication management as above.     DVT prophylaxis:     Subjective: Patient reports that he feels okay without pain.  No fever or chills.  Objective: Lying in bed, in no form of pain or respiratory distress. Vitals:   09/17/20 1555 09/17/20 1615 09/17/20 1820 09/17/20 2019  BP:  (!) 158/98 (!) 152/97 (!) 148/96  Pulse:  (!) 112 (!) 109 (!) 108  Resp:  '20 16 17  '$ Temp: 98.8 F (37.1 C) 98.4 F (36.9 C) 98.6 F (37 C) 98.7 F (37.1 C)  TempSrc:  Oral Oral   SpO2:  100% 98%  100%  Weight:      Height:        Intake/Output Summary (Last 24 hours) at 09/18/2020 0819 Last data filed at 09/18/2020 0700 Gross per 24 hour  Intake 1600 ml  Output 1500 ml  Net 100 ml   Filed Weights   09/17/20 0800 09/17/20 1044  Weight: (!) 144 kg (!) 144 kg    Examination: General: Not in obvious distress, alert awake . HENT:   No scleral pallor or icterus noted. Oral mucosa is moist.  Chest:   Clear to auscultation bilaterally. No crackles or wheezes.  CVS: S1 &S2 heard. No murmur.  Regular rate and rhythm. Abdomen: Soft, nontender, nondistended.  Bowel sounds are normoactive. Extremities: Clean dressing to left foot.  Right BKA Psych: Alert, awake.  Normal mood and affect CNS:  No cranial nerve deficits.  Power equal in all extremities.   Skin: Warm and dry.  No rashes noted.   Data Reviewed: I have personally reviewed following labs and imaging studies:  CBC: Recent Labs  Lab 09/13/20 1606 09/17/20 0115 09/18/20 0301  WBC 12.8* 15.2* 12.8*  NEUTROABS 9.3* 10.2*  --   HGB 8.1* 8.0* 8.0*  HCT 27.2* 25.9* 26.1*  MCV 84.5 82.2 83.1  PLT 461* 518* 0000000*   Basic Metabolic Panel: Recent Labs  Lab 09/13/20 1606 09/17/20 0115 09/18/20 0301  NA 136 136 136  K 4.4 3.7 4.2  CL 102 101 103  CO2 '23 24 24  '$ GLUCOSE 190* 167* 138*  BUN '20 15 12  '$ CREATININE 1.47* 1.63* 1.43*  CALCIUM 8.5* 8.6* 8.4*   GFR: Estimated Creatinine Clearance: 92.7 mL/min (A) (by C-G formula based on SCr of 1.43 mg/dL (H)). Liver Function Tests: Recent Labs  Lab 09/17/20 0115  AST 23  ALT 19  ALKPHOS 117  BILITOT 0.5  PROT 7.0  ALBUMIN 2.6*   No results for input(s): LIPASE, AMYLASE in the last 168 hours. No results for input(s): AMMONIA in the last 168 hours. Coagulation Profile: Recent Labs  Lab 09/17/20 0816  INR 1.3*   Cardiac Enzymes: No results for input(s): CKTOTAL, CKMB, CKMBINDEX, TROPONINI in the last 168 hours. BNP (last 3 results) No results for input(s):  PROBNP in the last 8760 hours. HbA1C: No results for input(s): HGBA1C in the last 72 hours. CBG: Recent Labs  Lab 09/17/20 0702 09/17/20 1219 09/17/20 1702 09/17/20 2018 09/18/20 0811  GLUCAP 144* 152* 127* 122* 141*   Lipid Profile: No results for input(s): CHOL, HDL, LDLCALC, TRIG, CHOLHDL, LDLDIRECT in the last 72 hours. Thyroid Function Tests: No results for input(s): TSH, T4TOTAL, FREET4, T3FREE, THYROIDAB in the last 72 hours. Anemia Panel: No  results for input(s): VITAMINB12, FOLATE, FERRITIN, TIBC, IRON, RETICCTPCT in the last 72 hours. Urine analysis:    Component Value Date/Time   COLORURINE YELLOW 09/17/2020 1441   APPEARANCEUR CLEAR 09/17/2020 1441   LABSPEC 1.015 09/17/2020 1441   PHURINE 5.0 09/17/2020 1441   GLUCOSEU NEGATIVE 09/17/2020 1441   HGBUR SMALL (A) 09/17/2020 1441   HGBUR negative 01/27/2010 0934   BILIRUBINUR NEGATIVE 09/17/2020 1441   KETONESUR NEGATIVE 09/17/2020 1441   PROTEINUR 100 (A) 09/17/2020 1441   UROBILINOGEN 0.2 10/26/2013 1130   NITRITE NEGATIVE 09/17/2020 1441   LEUKOCYTESUR NEGATIVE 09/17/2020 1441   Sepsis Labs: '@LABRCNTIP'$ (procalcitonin:4,lacticacidven:4)  ) Recent Results (from the past 240 hour(s))  Culture, blood (routine x 2)     Status: None (Preliminary result)   Collection Time: 09/13/20  8:37 PM   Specimen: BLOOD LEFT WRIST  Result Value Ref Range Status   Specimen Description BLOOD LEFT WRIST  Final   Special Requests   Final    BOTTLES DRAWN AEROBIC AND ANAEROBIC Blood Culture adequate volume   Culture   Final    NO GROWTH 4 DAYS Performed at [facility], [place] [address], [place], [place] [address]    Report Status PENDING  Incomplete  Culture, blood (routine x 2)     Status: None (Preliminary result)   Collection Time: 09/13/20  8:40 PM   Specimen: BLOOD  Result Value Ref Range Status   Specimen Description BLOOD RIGHT ANTECUBITAL  Final   Special Requests   Final    BOTTLES DRAWN AEROBIC AND  ANAEROBIC Blood Culture adequate volume   Culture   Final    NO GROWTH 4 DAYS Performed at [facility], [place] [address], [place],  [address]    Report Status PENDING  Incomplete  Resp Panel by RT-PCR (Flu A&B, Covid) Nasopharyngeal Swab     Status: None   Collection Time: 09/17/20  1:20 AM   Specimen: Nasopharyngeal Swab; Nasopharyngeal(NP) swabs in vial transport medium  Result Value Ref Range Status   SARS Coronavirus 2 by RT PCR NEGATIVE NEGATIVE Final    Comment: (NOTE) SARS-CoV-2 target nucleic acids are NOT DETECTED.  The SARS-CoV-2 RNA is generally detectable in upper respiratory specimens during the acute phase of infection. The lowest concentration of SARS-CoV-2 viral copies this assay can detect is 138 copies/mL. A negative result does not preclude SARS-Cov-2 infection and should not be used as the sole basis for treatment or other patient management decisions. A negative result may occur with  improper specimen collection/handling, submission of specimen other than nasopharyngeal swab, presence of viral mutation(s) within the areas targeted by this assay, and inadequate number of viral copies(<138 copies/mL). A negative result must be combined with clinical observations, patient history, and epidemiological information. The expected result is Negative.  Fact Sheet for Patients:  EntrepreneurPulse.com.au  Fact Sheet for Healthcare Providers:  IncredibleEmployment.be  This test is no t yet approved or cleared by the [place] FDA and  has been authorized for detection and/or diagnosis of SARS-CoV-2 by FDA under an Emergency Use Authorization (EUA). This EUA will remain  in effect (meaning this test can be used) for the duration of the COVID-19 declaration under Section 564(b)(1) of the Act, 21 U.S.C.section 360bbb-3(b)(1), unless the authorization is terminated  or revoked sooner.       Influenza A by PCR NEGATIVE  NEGATIVE Final   Influenza B by PCR NEGATIVE NEGATIVE Final    Comment: (NOTE) The Xpert Xpress SARS-CoV-2/FLU/RSV plus  assay is intended as an aid in the diagnosis of influenza from Nasopharyngeal swab specimens and should not be used as a sole basis for treatment. Nasal washings and aspirates are unacceptable for Xpert Xpress SARS-CoV-2/FLU/RSV testing.  Fact Sheet for Patients: EntrepreneurPulse.com.au  Fact Sheet for Healthcare Providers: IncredibleEmployment.be  This test is not yet approved or cleared by the [place] FDA and has been authorized for detection and/or diagnosis of SARS-CoV-2 by FDA under an Emergency Use Authorization (EUA). This EUA will remain in effect (meaning this test can be used) for the duration of the COVID-19 declaration under Section 564(b)(1) of the Act, 21 U.S.C. section 360bbb-3(b)(1), unless the authorization is terminated or revoked.  Performed at [facility], [place] [address] [place], [place] [address]          Radiology Studies: DG Chest 2 View  Result Date: 09/17/2020 CLINICAL DATA:  Chills for 1 day.  Blister on right lower leg. EXAM: CHEST - 2 VIEW COMPARISON:  09/13/2020 FINDINGS: Mild cardiac enlargement. No pleural effusion or edema. No airspace opacities identified. Thoracic spondylosis noted. IMPRESSION: No acute cardiopulmonary abnormalities. Electronically Signed   By: Kerby Moors M.D.   On: 09/17/2020 07:41   DG Tibia/Fibula Right  Result Date: 09/17/2020 CLINICAL DATA:  Chills.  Recent amputation. EXAM: RIGHT TIBIA AND FIBULA - 2 VIEW COMPARISON:  A 09/13/2020 FINDINGS: There is scratch set signs of recent below the knee amputation. There is a small suprapatellar joint effusion with a few loose bodies in the suprapatellar joint space. No signs of acute fracture or dislocation. No focal bone erosions identified. Diffuse soft tissue scratch set mild diffuse soft tissue swelling.  IMPRESSION: 1. Small suprapatellar joint effusion. 2. No focal bone erosions identified. 3. Soft tissue swelling. Electronically Signed   By: Kerby Moors M.D.   On: 09/17/2020 07:44   DG Foot Complete Left  Result Date: 09/17/2020 CLINICAL DATA:  Left foot pain EXAM: LEFT FOOT - COMPLETE 3+ VIEW COMPARISON:  None. FINDINGS: Pes planus deformity. 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 No acute fracture or dislocation. Small plantar calcaneal spur. Mild diffuse soft tissue swelling of the left foot. Extensive artifact related to the patient's overlying sock noted. IMPRESSION: Degenerative changes.  No acute fracture or dislocation. Electronically Signed   By: Fidela Salisbury MD   On: 09/17/2020 01:41        Scheduled Meds:  docusate sodium  100 mg Oral BID   enoxaparin (LOVENOX) injection  70 mg Subcutaneous Q24H   ferrous sulfate  325 mg Oral TID PC   gabapentin  300 mg Oral TID   hydrocerin   Topical BID   insulin aspart  0-15 Units Subcutaneous TID WC   insulin aspart  0-5 Units Subcutaneous QHS   insulin glargine-yfgn  25 Units Subcutaneous QHS   metoprolol tartrate  12.5 mg Oral BID   sodium chloride flush  3 mL Intravenous Q12H   Continuous Infusions:  sodium chloride 75 mL/hr at 09/17/20 1233   cefTRIAXone (ROCEPHIN)  IV 2 g (09/17/20 1821)   metronidazole 500 mg (09/18/20 0759)   vancomycin 2,000 mg (09/18/20 0216)     LOS: 1 day    Time spent: 35 minutes    Mackensi Mahadeo Manning Charity, MD [facility] 09/18/2020, 8:19 AM     Please page though [place] or  Epic secure chat:  For [employer], Adult nurse

## 2020-09-19 DIAGNOSIS — Z6838 Body mass index (BMI) 38.0-38.9, adult: Secondary | ICD-10-CM | POA: Diagnosis not present

## 2020-09-19 DIAGNOSIS — Z8616 Personal history of COVID-19: Secondary | ICD-10-CM | POA: Diagnosis not present

## 2020-09-19 DIAGNOSIS — M10021 Idiopathic gout, right elbow: Secondary | ICD-10-CM

## 2020-09-19 DIAGNOSIS — L97429 Non-pressure chronic ulcer of left heel and midfoot with unspecified severity: Secondary | ICD-10-CM | POA: Diagnosis not present

## 2020-09-19 DIAGNOSIS — N1831 Chronic kidney disease, stage 3a: Secondary | ICD-10-CM | POA: Diagnosis not present

## 2020-09-19 DIAGNOSIS — Z89511 Acquired absence of right leg below knee: Secondary | ICD-10-CM

## 2020-09-19 DIAGNOSIS — E11621 Type 2 diabetes mellitus with foot ulcer: Secondary | ICD-10-CM | POA: Diagnosis not present

## 2020-09-19 DIAGNOSIS — E669 Obesity, unspecified: Secondary | ICD-10-CM | POA: Diagnosis not present

## 2020-09-19 DIAGNOSIS — E1151 Type 2 diabetes mellitus with diabetic peripheral angiopathy without gangrene: Secondary | ICD-10-CM | POA: Diagnosis not present

## 2020-09-19 DIAGNOSIS — D649 Anemia, unspecified: Secondary | ICD-10-CM

## 2020-09-19 DIAGNOSIS — E119 Type 2 diabetes mellitus without complications: Secondary | ICD-10-CM

## 2020-09-19 DIAGNOSIS — I129 Hypertensive chronic kidney disease with stage 1 through stage 4 chronic kidney disease, or unspecified chronic kidney disease: Secondary | ICD-10-CM | POA: Diagnosis not present

## 2020-09-19 DIAGNOSIS — Z794 Long term (current) use of insulin: Secondary | ICD-10-CM | POA: Diagnosis not present

## 2020-09-19 DIAGNOSIS — Z79899 Other long term (current) drug therapy: Secondary | ICD-10-CM | POA: Diagnosis not present

## 2020-09-19 DIAGNOSIS — D631 Anemia in chronic kidney disease: Secondary | ICD-10-CM | POA: Diagnosis not present

## 2020-09-19 DIAGNOSIS — E11628 Type 2 diabetes mellitus with other skin complications: Secondary | ICD-10-CM | POA: Diagnosis present

## 2020-09-19 DIAGNOSIS — E1122 Type 2 diabetes mellitus with diabetic chronic kidney disease: Secondary | ICD-10-CM | POA: Diagnosis not present

## 2020-09-19 DIAGNOSIS — I10 Essential (primary) hypertension: Secondary | ICD-10-CM

## 2020-09-19 DIAGNOSIS — L03116 Cellulitis of left lower limb: Secondary | ICD-10-CM | POA: Diagnosis not present

## 2020-09-19 DIAGNOSIS — M109 Gout, unspecified: Secondary | ICD-10-CM | POA: Diagnosis not present

## 2020-09-19 LAB — BASIC METABOLIC PANEL
Anion gap: 8 (ref 5–15)
BUN: 11 mg/dL (ref 6–20)
CO2: 22 mmol/L (ref 22–32)
Calcium: 8 mg/dL — ABNORMAL LOW (ref 8.9–10.3)
Chloride: 105 mmol/L (ref 98–111)
Creatinine, Ser: 1.38 mg/dL — ABNORMAL HIGH (ref 0.61–1.24)
GFR, Estimated: >60 mL/min (ref >60)
Glucose, Bld: 160 mg/dL — ABNORMAL HIGH (ref 70–99)
Potassium: 3.5 mmol/L (ref 3.5–5.1)
Sodium: 135 mmol/L (ref 135–145)

## 2020-09-19 LAB — CBC
HCT: 22.9 % — ABNORMAL LOW (ref 39.0–52.0)
Hemoglobin: 7.1 g/dL — ABNORMAL LOW (ref 13.0–17.0)
MCH: 25.3 pg — ABNORMAL LOW (ref 26.0–34.0)
MCHC: 31 g/dL (ref 30.0–36.0)
MCV: 81.5 fL (ref 80.0–100.0)
Platelets: 316 10*3/uL (ref 150–400)
RBC: 2.81 MIL/uL — ABNORMAL LOW (ref 4.22–5.81)
RDW: 17.2 % — ABNORMAL HIGH (ref 11.5–15.5)
WBC: 11.1 10*3/uL — ABNORMAL HIGH (ref 4.0–10.5)
nRBC: 0 % (ref 0.0–0.2)

## 2020-09-19 LAB — GLUCOSE, CAPILLARY
Glucose-Capillary: 123 mg/dL — ABNORMAL HIGH (ref 70–99)
Glucose-Capillary: 107 mg/dL — ABNORMAL HIGH (ref 70–99)
Glucose-Capillary: 101 mg/dL — ABNORMAL HIGH (ref 70–99)
Glucose-Capillary: 115 mg/dL — ABNORMAL HIGH (ref 70–99)

## 2020-09-19 MED ORDER — AMOXICILLIN-POT CLAVULANATE 875-125 MG PO TABS: 1.0000 | ORAL_TABLET | Freq: Two times a day (BID) | ORAL | 0 refills | Status: DC

## 2020-09-19 MED ORDER — ADULT MULTIVITAMIN W/MINERALS CH: 1.0000 | ORAL_TABLET | Freq: Every day | ORAL | Status: DC

## 2020-09-19 MED ORDER — DOXYCYCLINE HYCLATE 100 MG PO TABS: 100.0000 mg | ORAL_TABLET | Freq: Two times a day (BID) | ORAL | 0 refills | Status: DC

## 2020-09-19 NOTE — Progress Notes (Signed)
Orthopedic Tech Progress Note Patient Details:  Patrick [name] 04-Nov-1965 [id number]  Ortho Devices Type of Ortho Device: Darco shoe Ortho Device/Splint Location: LLE Ortho Device/Splint Interventions: Ordered, Application, Adjustment   Post Interventions Patient Tolerated: Well Instructions Provided: [facility] [place] 09/19/2020, 1:08 PM

## 2020-09-19 NOTE — Discharge Summary (Signed)
Physician Discharge Summary  Patrick [name] [id number] DOB: August 19, 1965 DOA: 09/17/2020  PCP: Beaulah Corin, [facility]  Admit date: 09/17/2020 Discharge date: 09/19/2020  Admitted From: Home  Discharge disposition: Home  Recommendations for Outpatient Follow-Up:   Follow up with your primary care provider in one week.  Check CBC, BMP, magnesium in the next visit Patient will need to follow-up with orthopedic surgery as outpatient.  Discharge Diagnosis:   Principal Problem:   Diabetic foot infection ([place]) Active Problems:   Insulin-requiring or dependent type II diabetes mellitus ([place])   GOUT   OBESITY   HYPERTENSION, BENIGN ESSENTIAL   CKD (chronic kidney disease) stage 3, GFR 30-59 ml/min (HCC)   Anemia   S/P BKA (below knee amputation) unilateral, right ([place])   Discharge Condition: Improved.  Diet recommendation: Low sodium, heart healthy.  Carbohydrate-modified.    Wound care: Left foot blister care  Code status: Full.   History of Present Illness:   This  is a 55 y.o. old male patient with a history of hypertension, diabetes, gout, PVD status post right BKA on 08/27/2020 was admitted on 09/17/2020 after he presented with left foot distress with some redness for 2 days prior to presentation.  Patient was then admitted to the hospital with left-sided diabetic foot and cellulitis.  Hospital Course:   Following conditions were addressed during hospitalization as listed below,  Left diabetic foot infection Patient was admitted to hospital and was just initiated on ceftriaxone, metronidazole and vancomycin.  MRI of the left foot is negative for osteomyelitis or abscess.  Wound care was consulted and recommended shoes on discharge with outpatient follow-up.  Patient will be discharged home on Augmentin and doxycycline on discharge 7 days to complete the course.  He was advised to follow-up with Dr Duda/orthopedics after discharge.  Patient was prescribed Dacro shoes on  discharge.  .Essential hypertension On losartan HCTZ and metoprolol.   Diabetes mellitus type II. Continue with insulin glargine and aspart at home.  Continue diabetic diet at home.   Chronic kidney disease stage III. Stable.   History of gout. Asymptomatic.  Disposition.  At this time, patient is stable for disposition home with outpatient PCP, orthopedics follow-up.  Medical Consultants:   Wound care  Procedures:    None Subjective:   Today, patient was seen and examined at bedside.  States that he wishes to go home today.  He has a job that he would not be able to miss. Complains of mild foot discomfort.  Discharge Exam:   Vitals:   09/19/20 0510 09/19/20 0833  BP: (!) 161/97 (!) 146/94  Pulse: (!) 107 (!) 109  Resp: 18 17  Temp: 98.7 F (37.1 C) 99.7 F (37.6 C)  SpO2: 100% 99%   Vitals:   09/18/20 2020 09/19/20 0020 09/19/20 0510 09/19/20 0833  BP: (!) 157/95 (!) 148/99 (!) 161/97 (!) 146/94  Pulse: (!) 109 (!) 102 (!) 107 (!) 109  Resp: '20 18 18 17  '$ Temp: 99.9 F (37.7 C) 98.6 F (37 C) 98.7 F (37.1 C) 99.7 F (37.6 C)  TempSrc: Oral Oral Oral Oral  SpO2: 98% 99% 100% 99%  Weight:      Height:       General: Alert awake, not in obvious distress HENT: pupils equally reacting to light,  No scleral pallor or icterus noted. Oral mucosa is moist.  Chest:  Clear breath sounds.  Diminished breath sounds bilaterally. No crackles or wheezes.  CVS: S1 &S2 heard. No murmur.  Regular rate and rhythm. Abdomen: Soft, nontender, nondistended.  Bowel sounds are heard.   Extremities: No cyanosis, clubbing or edema.  Peripheral pulses are palpable.  Right below-knee amputation with multiple staples.  Left foot blisters covered with dressing Psych: Alert, awake and oriented, normal mood CNS:  No cranial nerve deficits.  Power equal in all extremities.   Skin: Warm and dry.    The results of significant diagnostics from this hospitalization (including imaging,  microbiology, ancillary and laboratory) are listed below for reference.     Diagnostic Studies:   DG Chest 2 View  Result Date: 09/17/2020 CLINICAL DATA:  Chills for 1 day.  Blister on right lower leg. EXAM: CHEST - 2 VIEW COMPARISON:  09/13/2020 FINDINGS: Mild cardiac enlargement. No pleural effusion or edema. No airspace opacities identified. Thoracic spondylosis noted. IMPRESSION: No acute cardiopulmonary abnormalities. Electronically Signed   By: Kerby Moors M.D.   On: 09/17/2020 07:41   DG Tibia/Fibula Right  Result Date: 09/17/2020 CLINICAL DATA:  Chills.  Recent amputation. EXAM: RIGHT TIBIA AND FIBULA - 2 VIEW COMPARISON:  A 09/13/2020 FINDINGS: There is scratch set signs of recent below the knee amputation. There is a small suprapatellar joint effusion with a few loose bodies in the suprapatellar joint space. No signs of acute fracture or dislocation. No focal bone erosions identified. Diffuse soft tissue scratch set mild diffuse soft tissue swelling. IMPRESSION: 1. Small suprapatellar joint effusion. 2. No focal bone erosions identified. 3. Soft tissue swelling. Electronically Signed   By: Kerby Moors M.D.   On: 09/17/2020 07:44   MR HEEL LEFT W WO CONTRAST  Result Date: 09/18/2020 CLINICAL DATA:  Diabetic patient with a left foot wound which began as a blister 2 days ago. Draining wound on the left heel. EXAM: MRI OF LOWER LEFT EXTREMITY WITHOUT AND WITH CONTRAST TECHNIQUE: Multiplanar, multisequence MR imaging of the left ankle was performed both before and after administration of intravenous contrast. CONTRAST:  10 mL GADAVIST IV SOLN COMPARISON:  Plain films left ankle 09/17/2020. FINDINGS: Bones/Joint/Cartilage There is no marrow edema or enhancement to suggest osteomyelitis. Low level edema throughout the midfoot due to Charcot change is noted. No fracture. Plantar calcaneal spur noted. Ligaments Intact. Muscles and Tendons Intact.  Negative for myositis. Soft tissues Intense  subcutaneous edema is present about the ankle. IMPRESSION: Intense subcutaneous edema about the ankle could be due to dependent change or cellulitis. Negative for abscess, myositis, osteomyelitis or septic joint. Neuropathic change of the midfoot. Electronically Signed   By: Inge Rise M.D.   On: 09/18/2020 09:46   DG Foot Complete Left  Result Date: 09/17/2020 CLINICAL DATA:  Left foot pain EXAM: LEFT FOOT - COMPLETE 3+ VIEW COMPARISON:  None. FINDINGS: Pes planus deformity. 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 No acute fracture or dislocation. Small plantar calcaneal spur. Mild diffuse soft tissue swelling of the left foot. Extensive artifact related to the patient's overlying sock noted. IMPRESSION: Degenerative changes.  No acute fracture or dislocation. Electronically Signed   By: Fidela Salisbury MD   On: 09/17/2020 01:41   VAS [place] ABI WITH/WO TBI  Result Date: 09/18/2020  LOWER EXTREMITY DOPPLER STUDY Patient Name:  Yohei Tysdal  Date of Exam:   09/18/2020 Medical Rec #: [id number]   Accession #:    [id number] Date of Birth: 08-17-1965  Patient Gender: M Patient Age:   89Y Exam Location:  [facility] Procedure:      VAS [place] ABI WITH/WO TBI Referring Phys: --------------------------------------------------------------------------------  Indications: Ulceration. High Risk Factors: Hypertension, Diabetes.  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 Limitations: Today's exam was limited due to Right BKA. Skin texture, size of              great toe. Comparison Study: Prior ABI one 06/05/20 Performing Technologist: Sharion Dove RVS  Examination Guidelines: A complete evaluation includes at minimum, Doppler  waveform signals and systolic blood pressure reading at the level of bilateral brachial, anterior tibial, and posterior tibial arteries, when vessel segments are accessible. Bilateral testing is considered an integral part of a complete examination. Photoelectric Plethysmograph (PPG) waveforms and toe systolic pressure readings are included as required and additional duplex testing as needed. Limited examinations for reoccurring indications may be performed as noted.  ABI Findings: +---------+------------------+-----+---------+--------+ Right    Rt Pressure (mmHg)IndexWaveform Comment  +---------+------------------+-----+---------+--------+ Brachial 145                    triphasic         +---------+------------------+-----+---------+--------+ PTA                                      BKA      +---------+------------------+-----+---------+--------+ DP                                       BKA      +---------+------------------+-----+---------+--------+ Great Toe                                BKA      +---------+------------------+-----+---------+--------+ +---------+------------------+-----+-----------+-------------------------------+ Left     Lt Pressure (mmHg)IndexWaveform   Comment                         +---------+------------------+-----+-----------+-------------------------------+ Brachial 140                    triphasic                                  +---------+------------------+-----+-----------+-------------------------------+ PTA      157               1.08 multiphasic                                +---------+------------------+-----+-----------+-------------------------------+ DP       174               1.20 multiphasic                                +---------+------------------+-----+-----------+-------------------------------+ Great Toe  Unable to attain Toe pressure                                               secondary to size of toe        +---------+------------------+-----+-----------+-------------------------------+ +-------+-----------+-----------+------------+------------+ ABI/TBIToday's ABIToday's TBIPrevious ABIPrevious TBI +-------+-----------+-----------+------------+------------+ Right  BKA                   1.18                     +-------+-----------+-----------+------------+------------+ Left   1.14                                           +-------+-----------+-----------+------------+------------+ Left ABIs appear essentially unchanged compared to prior study on 06/05/20.  Summary: Right: BKA. Left: Resting left ankle-brachial index is within normal range. No evidence of significant left lower extremity arterial disease. Unable to attain great toe pressure secondary to size of toe.  *See table(s) above for measurements and observations.  Electronically signed by Deitra Mayo MD on 09/18/2020 at 1:30:36 PM.    Final      Labs:   Basic Metabolic Panel: Recent Labs  Lab 09/13/20 1606 09/17/20 0115 09/18/20 0301 09/19/20 0138  NA 136 136 136 135  K 4.4 3.7 4.2 3.5  CL 102 101 103 105  CO2 '23 24 24 22  '$ GLUCOSE 190* 167* 138* 160*  BUN '20 15 12 11  '$ CREATININE 1.47* 1.63* 1.43* 1.38*  CALCIUM 8.5* 8.6* 8.4* 8.0*   GFR Estimated Creatinine Clearance: 96.1 mL/min (A) (by C-G formula based on SCr of 1.38 mg/dL (H)). Liver Function Tests: Recent Labs  Lab 09/17/20 0115  AST 23  ALT 19  ALKPHOS 117  BILITOT 0.5  PROT 7.0  ALBUMIN 2.6*   No results for input(s): LIPASE, AMYLASE in the last 168 hours. No results for input(s): AMMONIA in the last 168 hours. Coagulation profile Recent Labs  Lab 09/17/20 0816  INR 1.3*    CBC: Recent Labs  Lab 09/13/20 1606 09/17/20 0115 09/18/20 0301 09/19/20 0138  WBC 12.8* 15.2* 12.8* 11.1*  NEUTROABS 9.3* 10.2*  --   --   HGB 8.1* 8.0* 8.0* 7.1*  HCT 27.2* 25.9* 26.1* 22.9*  MCV 84.5 82.2 83.1  81.5  PLT 461* 518* 476* 316   Cardiac Enzymes: No results for input(s): CKTOTAL, CKMB, CKMBINDEX, TROPONINI in the last 168 hours. BNP: Invalid input(s): POCBNP CBG: Recent Labs  Lab 09/18/20 1536 09/18/20 1956 09/19/20 0439 09/19/20 0830 09/19/20 1132  GLUCAP 143* 116* 123* 107* 101*   D-Dimer No results for input(s): DDIMER in the last 72 hours. Hgb A1c No results for input(s): HGBA1C in the last 72 hours. Lipid Profile No results for input(s): CHOL, HDL, LDLCALC, TRIG, CHOLHDL, LDLDIRECT in the last 72 hours. Thyroid function studies No results for input(s): TSH, T4TOTAL, T3FREE, THYROIDAB in the last 72 hours.  Invalid input(s): FREET3 Anemia work up No results for input(s): VITAMINB12, FOLATE, FERRITIN, TIBC, IRON, RETICCTPCT in the last 72 hours. Microbiology Recent Results (from the past 240 hour(s))  Culture, blood (routine x 2)     Status: None   Collection Time: 09/13/20  8:37 PM   Specimen: BLOOD LEFT WRIST  Result Value Ref Range Status  Specimen Description BLOOD LEFT WRIST  Final   Special Requests   Final    BOTTLES DRAWN AEROBIC AND ANAEROBIC Blood Culture adequate volume   Culture   Final    NO GROWTH 5 DAYS Performed at [facility], [address], [place], [place] [address]    Report Status 09/18/2020 FINAL  Final  Culture, blood (routine x 2)     Status: None   Collection Time: 09/13/20  8:40 PM   Specimen: BLOOD  Result Value Ref Range Status   Specimen Description BLOOD RIGHT ANTECUBITAL  Final   Special Requests   Final    BOTTLES DRAWN AEROBIC AND ANAEROBIC Blood Culture adequate volume   Culture   Final    NO GROWTH 5 DAYS Performed at [facility], [place] [address], [place], [place] [address]    Report Status 09/18/2020 FINAL  Final  Blood culture (routine x 2)     Status: None (Preliminary result)   Collection Time: 09/17/20  1:00 AM   Specimen: BLOOD  Result Value Ref Range Status   Specimen Description BLOOD RIGHT  ARM  Final   Special Requests   Final    BOTTLES DRAWN AEROBIC AND ANAEROBIC Blood Culture adequate volume   Culture   Final    NO GROWTH 2 DAYS Performed at [facility], [place] [address] [place], [place] [address]    Report Status PENDING  Incomplete  Blood culture (routine x 2)     Status: None (Preliminary result)   Collection Time: 09/17/20  1:16 AM   Specimen: BLOOD  Result Value Ref Range Status   Specimen Description BLOOD LEFT ARM  Final   Special Requests   Final    BOTTLES DRAWN AEROBIC ONLY Blood Culture adequate volume   Culture   Final    NO GROWTH 2 DAYS Performed at [facility], [place] [address] [place], [place] [address]    Report Status PENDING  Incomplete  Resp Panel by RT-PCR (Flu A&B, Covid) Nasopharyngeal Swab     Status: None   Collection Time: 09/17/20  1:20 AM   Specimen: Nasopharyngeal Swab; Nasopharyngeal(NP) swabs in vial transport medium  Result Value Ref Range Status   SARS Coronavirus 2 by RT PCR NEGATIVE NEGATIVE Final    Comment: (NOTE) SARS-CoV-2 target nucleic acids are NOT DETECTED.  The SARS-CoV-2 RNA is generally detectable in upper respiratory specimens during the acute phase of infection. The lowest concentration of SARS-CoV-2 viral copies this assay can detect is 138 copies/mL. A negative result does not preclude SARS-Cov-2 infection and should not be used as the sole basis for treatment or other patient management decisions. A negative result may occur with  improper specimen collection/handling, submission of specimen other than nasopharyngeal swab, presence of viral mutation(s) within the areas targeted by this assay, and inadequate number of viral copies(<138 copies/mL). A negative result must be combined with clinical observations, patient history, and epidemiological information. The expected result is Negative.  Fact Sheet for Patients:  EntrepreneurPulse.com.au  Fact Sheet for Healthcare  Providers:  IncredibleEmployment.be  This test is no t yet approved or cleared by the [place] FDA and  has been authorized for detection and/or diagnosis of SARS-CoV-2 by FDA under an Emergency Use Authorization (EUA). This EUA will remain  in effect (meaning this test can be used) for the duration of the COVID-19 declaration under Section 564(b)(1) of the Act, 21 U.S.C.section 360bbb-3(b)(1), unless the authorization is terminated  or revoked sooner.       Influenza A by PCR NEGATIVE NEGATIVE Final   Influenza B by PCR NEGATIVE NEGATIVE Final    Comment: (NOTE) The Xpert Xpress SARS-CoV-2/FLU/RSV plus assay is intended as an aid in the diagnosis of influenza from Nasopharyngeal swab specimens and should not be used as a sole basis for treatment. Nasal washings and aspirates are unacceptable for Xpert Xpress SARS-CoV-2/FLU/RSV testing.  Fact Sheet for Patients: EntrepreneurPulse.com.au  Fact Sheet for Healthcare Providers: IncredibleEmployment.be  This test is not yet approved or cleared by the [place] FDA and has been authorized for detection and/or diagnosis of SARS-CoV-2 by FDA under an Emergency Use Authorization (EUA). This EUA will remain in effect (meaning this test can be used) for the duration of the COVID-19 declaration under Section 564(b)(1) of the Act, 21 U.S.C. section 360bbb-3(b)(1), unless the authorization is terminated or revoked.  Performed at [facility], [place] [address] [place], [place] [address]      Discharge Instructions:   Discharge Instructions     Call MD for:  redness, tenderness, or signs of infection (pain, swelling, redness, odor or green/yellow discharge around incision site)   Complete by: As directed    Call MD for:  severe uncontrolled pain   Complete by: As directed    Call MD for:  temperature >100.4   Complete by: As directed    Diet - low sodium heart  healthy   Complete by: As directed    Diet Carb Modified   Complete by: As directed    Discharge instructions   Complete by: As directed    Follow-up with Dr Sharol Given orthopedics within 1 week.  Complete the course of antibiotic.  Use redistribution shoe when ambulating.  Try to keep off pressure of the heel.   Discharge wound care:   Complete by: As directed    Wound care to left foot, plantar aspect, serum-filled blisters: Cleanse foot with soap and water, rinse and pat dry. Cover with xeroform gauze, apply Eucerin to foot and LE. Cover xeroform with dry gauze and secure with Kerlix. Place foot into [employer].  Paint right amputation stapled surgical site with betadine swabstick and allow to air dry. No dressing.  Elevate limb on pillow.   Increase activity slowly   Complete by: As directed       Allergies as of 09/19/2020   No Known Allergies      Medication List     TAKE these medications    acetaminophen 500 MG tablet Commonly known as: TYLENOL Take 1,000 mg by mouth every 6 (six) hours as needed for mild pain or headache.   allopurinol 100 MG tablet Commonly known as: ZYLOPRIM Take 1 tablet (100 mg total) by mouth 2 (two) times daily. What changed:  when to take this reasons to take this   amoxicillin-clavulanate 875-125 MG tablet Commonly known as: Augmentin Take 1 tablet by mouth 2 (two) times daily for 7 days.   BD Pen Needle Nano U/F 32G X 4 MM Misc Generic drug: Insulin Pen Needle Use as directed   Insulin Pen Needle 32G X 4 MM Misc Use as directed   docusate sodium 100 MG capsule Commonly known as: COLACE Take 1 capsule (100 mg total) by mouth daily. What changed:  when to take this reasons to take this   doxycycline 100 MG tablet Commonly known as: VIBRA-TABS Take 1 tablet (100 mg total) by mouth 2 (two) times daily for 7 days.  FeroSul 325 (65 FE) MG tablet Generic drug: ferrous sulfate Take 1 tablet (325 mg total) by mouth 3 (three) times  daily after meals.   gabapentin 300 MG capsule Commonly known as: NEURONTIN Take 1 capsule (300 mg total) by mouth 3 (three) times daily.   Lantus SoloStar 100 UNIT/ML Solostar Pen Generic drug: insulin glargine Inject 25 Units into the skin 2 (two) times daily.   losartan 100 MG tablet Commonly known as: COZAAR Take 100 mg by mouth daily.   losartan-hydrochlorothiazide 100-25 MG tablet Commonly known as: HYZAAR Take 1 tablet by mouth daily.   metoprolol tartrate 25 MG tablet Commonly known as: LOPRESSOR Take 0.5 tablets (12.5 mg total) by mouth 2 (two) times daily.   multivitamin with minerals Tabs tablet Take 1 tablet by mouth daily.   NovoLOG FlexPen 100 UNIT/ML FlexPen Generic drug: insulin aspart Inject 4 Units into the skin 3 (three) times daily with meals.   Oxycodone HCl 10 MG Tabs Take 1-1.5 tablets (10-15 mg total) by mouth every 4 (four) hours as needed (pain score 7-10).   tiZANidine 4 MG capsule Commonly known as: ZANAFLEX Take 4 mg by mouth daily as needed for muscle spasms.   vitamin C 1000 MG tablet Take 1 tablet (1,000 mg total) by mouth daily.   Zinc Sulfate 220 (50 Zn) MG Tabs Take 1 tablet (220 mg total) by mouth daily.               Discharge Care Instructions  (From admission, onward)           Start     Ordered   09/19/20 0000  Discharge wound care:       Comments: Wound care to left foot, plantar aspect, serum-filled blisters: Cleanse foot with soap and water, rinse and pat dry. Cover with xeroform gauze, apply Eucerin to foot and LE. Cover xeroform with dry gauze and secure with Kerlix. Place foot into [employer].  Paint right amputation stapled surgical site with betadine swabstick and allow to air dry. No dressing.  Elevate limb on pillow.   09/19/20 1133            Follow-up Information     [facility], [facility]. Schedule an appointment as soon as possible for a visit in 1 week(s).   Specialty: Internal Medicine Why:  Blood work, regular checkup Contact information: [place] [address] [phone]         [name], MD. Schedule an appointment as soon as possible for a visit in 1 week(s).   Specialty: Orthopedic Surgery Why: For foot infection/amputation follow-up. Contact information: [place] [address] [phone]                  Time coordinating discharge: 39 minutes  Signed:  Seward Coran  [facility] 09/19/2020, 2:01 PM

## 2020-09-19 NOTE — Discharge Summary (Signed)
Physician Discharge Summary  Patrick [name] [id number] DOB: May 16, 1965 DOA: 09/17/2020  PCP: Beaulah Corin, [facility]  Admit date: 09/17/2020 Discharge date: 09/19/2020  Admitted From: Home  Discharge disposition: Home  Recommendations for Outpatient Follow-Up:   Follow up with your primary care provider in one week.  Follow-up with Dr. Sharol Given orthopedics within 1 week to discuss about left foot blister. Check CBC, BMP, magnesium in the next visit  Discharge Diagnosis:   Principal Problem:   Diabetic foot infection ([place]) Active Problems:   Insulin-requiring or dependent type II diabetes mellitus ([place])   GOUT   OBESITY   HYPERTENSION, BENIGN ESSENTIAL   CKD (chronic kidney disease) stage 3, GFR 30-59 ml/min (HCC)   Anemia   S/P BKA (below knee amputation) unilateral, right ([place])   Discharge Condition: Improved.  Diet recommendation: Low sodium, heart healthy.  Carbohydrate-modified.    Wound care: Left foot dressing.  Code status: Full.  History of Present Illness:   Patrick [name] is a 55 y.o. male with medical history of hypertension, diabetes mellitus type 2, gout, peripheral vascular disease status post right BKA on 08/27/20 presented to the hospital with left foot blisters.  Patient had  Right BKA 2 weeks ago and he went to inpatient rehab, discharged 1 week ago.  He returned to the ER on 7/26 after he stumbled and fell.  His short-term goal is to get a prosthesis and go back to living independently (he lived with friends after the divorce from his wife, but their house is not handicapped accessible).  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  In the ED, patient was noted to have some cellulitis of the left foot area and was admitted to hospital for diabetic foot cellulitis.    Hospital Course:   Following conditions were addressed during hospitalization as listed below,  Left diabetic foot cellulitis/ruptured blister. Started with blisters and mild  erythema after rupture.  X-ray of the foot was unremarkable.  MRI of the foot without any osteomyelitis.  Has been seen by wound care.  Recommend dressing and boot.  Patient is requesting discharge home due to his new job.  At this time, since patient is improving will consider doxycycline and Augmentin on discharge.  He was advised to keep off the pressure from the foot and follow-up with his primary care physician and orthopedics as soon as possible.  Left ankle-brachial index within normal limits.  Blood cultures negative in 2 days.   Recent right-sided BKA Patient was advised to follow-up with orthopedic Dr Sharol Given as outpatient.  Healing well.   Essential HTN Continue outpatient medication.   DM type II Patient had hemoglobin A1c of 7.83 weeks ago.  Patient will resume insulin regimen on discharge.   Gout -Only takes Allopurinol prn   Stage 3a CKD Likely at baseline.  Losartan HCTZ as outpatient.  Will need closer monitoring as outpatient   Anemia Secondary to anemia of chronic kidney disease.  On iron sulfate as outpatient.   Obesity Patient would benefit from weight loss and lifestyle modification as outpatient.  Disposition.  At this time, patient is stable for disposition home with outpatient PCP and orthopedic follow-up.  Medical Consultants:   None.  Procedures:    None Subjective:   Today, patient was seen and examined at bedside.  Denies overt pain.  No fever chills or rigor.  Wishes to go home due to his new job.  Discharge Exam:   Vitals:  09/19/20 0510 09/19/20 0833  BP: (!) 161/97 (!) 146/94  Pulse: (!) 107 (!) 109  Resp: 18 17  Temp: 98.7 F (37.1 C) 99.7 F (37.6 C)  SpO2: 100% 99%   Vitals:   09/18/20 2020 09/19/20 0020 09/19/20 0510 09/19/20 0833  BP: (!) 157/95 (!) 148/99 (!) 161/97 (!) 146/94  Pulse: (!) 109 (!) 102 (!) 107 (!) 109  Resp: '20 18 18 17  '$ Temp: 99.9 F (37.7 C) 98.6 F (37 C) 98.7 F (37.1 C) 99.7 F (37.6 C)  TempSrc: Oral  Oral Oral Oral  SpO2: 98% 99% 100% 99%  Weight:      Height:        General: Alert awake, not in obvious distress, obese HENT: pupils equally reacting to light,  No scleral pallor or icterus noted. Oral mucosa is moist.  Chest:  Clear breath sounds.  Diminished breath sounds bilaterally. No crackles or wheezes.  CVS: S1 &S2 heard. No murmur.  Regular rate and rhythm. Abdomen: Soft, nontender, nondistended.  Bowel sounds are heard.   Extremities: Left heel with dressing.  Right below-knee amputation stump appears clean dry intact with staples in place Psych: Alert, awake and oriented, normal mood CNS:  No cranial nerve deficits.  Power equal in all extremities.   Skin: Warm and dry.   The results of significant diagnostics from this hospitalization (including imaging, microbiology, ancillary and laboratory) are listed below for reference.     Diagnostic Studies:   DG Chest 2 View  Result Date: 09/17/2020 CLINICAL DATA:  Chills for 1 day.  Blister on right lower leg. EXAM: CHEST - 2 VIEW COMPARISON:  09/13/2020 FINDINGS: Mild cardiac enlargement. No pleural effusion or edema. No airspace opacities identified. Thoracic spondylosis noted. IMPRESSION: No acute cardiopulmonary abnormalities. Electronically Signed   By: Kerby Moors M.D.   On: 09/17/2020 07:41   DG Tibia/Fibula Right  Result Date: 09/17/2020 CLINICAL DATA:  Chills.  Recent amputation. EXAM: RIGHT TIBIA AND FIBULA - 2 VIEW COMPARISON:  A 09/13/2020 FINDINGS: There is scratch set signs of recent below the knee amputation. There is a small suprapatellar joint effusion with a few loose bodies in the suprapatellar joint space. No signs of acute fracture or dislocation. No focal bone erosions identified. Diffuse soft tissue scratch set mild diffuse soft tissue swelling. IMPRESSION: 1. Small suprapatellar joint effusion. 2. No focal bone erosions identified. 3. Soft tissue swelling. Electronically Signed   By: Kerby Moors M.D.    On: 09/17/2020 07:44   MR HEEL LEFT W WO CONTRAST  Result Date: 09/18/2020 CLINICAL DATA:  Diabetic patient with a left foot wound which began as a blister 2 days ago. Draining wound on the left heel. EXAM: MRI OF LOWER LEFT EXTREMITY WITHOUT AND WITH CONTRAST TECHNIQUE: Multiplanar, multisequence MR imaging of the left ankle was performed both before and after administration of intravenous contrast. CONTRAST:  10 mL GADAVIST IV SOLN COMPARISON:  Plain films left ankle 09/17/2020. FINDINGS: Bones/Joint/Cartilage There is no marrow edema or enhancement to suggest osteomyelitis. Low level edema throughout the midfoot due to Charcot change is noted. No fracture. Plantar calcaneal spur noted. Ligaments Intact. Muscles and Tendons Intact.  Negative for myositis. Soft tissues Intense subcutaneous edema is present about the ankle. IMPRESSION: Intense subcutaneous edema about the ankle could be due to dependent change or cellulitis. Negative for abscess, myositis, osteomyelitis or septic joint. Neuropathic change of the midfoot. Electronically Signed   By: Inge Rise M.D.   On: 09/18/2020 09:46  DG Foot Complete Left  Result Date: 09/17/2020 CLINICAL DATA:  Left foot pain EXAM: LEFT FOOT - COMPLETE 3+ VIEW COMPARISON:  None. FINDINGS: Pes planus deformity. 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 No acute fracture or dislocation. Small plantar calcaneal spur. Mild diffuse soft tissue swelling of the left foot. Extensive artifact related to the patient's overlying sock noted. IMPRESSION: Degenerative changes.  No acute fracture or dislocation. Electronically Signed   By: Fidela Salisbury MD   On: 09/17/2020 01:41   VAS [place] ABI WITH/WO TBI  Result Date: 09/18/2020  LOWER EXTREMITY DOPPLER STUDY Patient Name:  Tye Brenneman  Date of Exam:   09/18/2020 Medical Rec #: [id number]   Accession #:    [id number] Date of Birth: 01-10-1966   Patient Gender: M Patient Age:   66Y Exam Location:  [facility] Procedure:      VAS [place] ABI WITH/WO TBI Referring Phys: --------------------------------------------------------------------------------  Indications: Ulceration. High Risk Factors: Hypertension, Diabetes.  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 Limitations: Today's exam was limited due to Right BKA. Skin texture, size of              great toe. Comparison Study: Prior ABI one 06/05/20 Performing Technologist: Sharion Dove RVS  Examination Guidelines: A complete evaluation includes at minimum, Doppler waveform signals and systolic blood pressure reading at the level of bilateral brachial, anterior tibial, and posterior tibial arteries, when vessel segments are accessible. Bilateral testing is considered an integral part of a complete examination. Photoelectric Plethysmograph (PPG) waveforms and toe systolic pressure readings are included as required and additional duplex testing as needed. Limited examinations for reoccurring indications may be performed as noted.  ABI Findings: +---------+------------------+-----+---------+--------+ Right    Rt Pressure (mmHg)IndexWaveform Comment  +---------+------------------+-----+---------+--------+ Brachial 145                    triphasic         +---------+------------------+-----+---------+--------+ PTA                                      BKA      +---------+------------------+-----+---------+--------+ DP                                       BKA      +---------+------------------+-----+---------+--------+ Great Toe                                BKA      +---------+------------------+-----+---------+--------+  +---------+------------------+-----+-----------+-------------------------------+ Left     Lt Pressure (mmHg)IndexWaveform   Comment                         +---------+------------------+-----+-----------+-------------------------------+ Brachial 140                    triphasic                                  +---------+------------------+-----+-----------+-------------------------------+  PTA      157               1.08 multiphasic                                +---------+------------------+-----+-----------+-------------------------------+ DP       174               1.20 multiphasic                                +---------+------------------+-----+-----------+-------------------------------+ Great Toe                                  Unable to attain Toe pressure                                              secondary to size of toe        +---------+------------------+-----+-----------+-------------------------------+ +-------+-----------+-----------+------------+------------+ ABI/TBIToday's ABIToday's TBIPrevious ABIPrevious TBI +-------+-----------+-----------+------------+------------+ Right  BKA                   1.18                     +-------+-----------+-----------+------------+------------+ Left   1.14                                           +-------+-----------+-----------+------------+------------+ Left ABIs appear essentially unchanged compared to prior study on 06/05/20.  Summary: Right: BKA. Left: Resting left ankle-brachial index is within normal range. No evidence of significant left lower extremity arterial disease. Unable to attain great toe pressure secondary to size of toe.  *See table(s) above for measurements and observations.  Electronically signed by Deitra Mayo MD on 09/18/2020 at 1:30:36 PM.    Final      Labs:   Basic Metabolic Panel: Recent Labs  Lab 09/13/20 1606 09/17/20 0115 09/18/20 0301 09/19/20 0138  NA  136 136 136 135  K 4.4 3.7 4.2 3.5  CL 102 101 103 105  CO2 '23 24 24 22  '$ GLUCOSE 190* 167* 138* 160*  BUN '20 15 12 11  '$ CREATININE 1.47* 1.63* 1.43* 1.38*  CALCIUM 8.5* 8.6* 8.4* 8.0*   GFR Estimated Creatinine Clearance: 96.1 mL/min (A) (by C-G formula based on SCr of 1.38 mg/dL (H)). Liver Function Tests: Recent Labs  Lab 09/17/20 0115  AST 23  ALT 19  ALKPHOS 117  BILITOT 0.5  PROT 7.0  ALBUMIN 2.6*   No results for input(s): LIPASE, AMYLASE in the last 168 hours. No results for input(s): AMMONIA in the last 168 hours. Coagulation profile Recent Labs  Lab 09/17/20 0816  INR 1.3*    CBC: Recent Labs  Lab 09/13/20 1606 09/17/20 0115 09/18/20 0301 09/19/20 0138  WBC 12.8* 15.2* 12.8* 11.1*  NEUTROABS 9.3* 10.2*  --   --   HGB 8.1* 8.0* 8.0* 7.1*  HCT 27.2* 25.9* 26.1* 22.9*  MCV 84.5 82.2 83.1 81.5  PLT 461* 518* 476* 316   Cardiac Enzymes: No results for input(s): CKTOTAL, CKMB, CKMBINDEX, TROPONINI in the last 168 hours. BNP:  Invalid input(s): POCBNP CBG: Recent Labs  Lab 09/18/20 1536 09/18/20 1956 09/19/20 0439 09/19/20 0830 09/19/20 1132  GLUCAP 143* 116* 123* 107* 101*   D-Dimer No results for input(s): DDIMER in the last 72 hours. Hgb A1c No results for input(s): HGBA1C in the last 72 hours. Lipid Profile No results for input(s): CHOL, HDL, LDLCALC, TRIG, CHOLHDL, LDLDIRECT in the last 72 hours. Thyroid function studies No results for input(s): TSH, T4TOTAL, T3FREE, THYROIDAB in the last 72 hours.  Invalid input(s): FREET3 Anemia work up No results for input(s): VITAMINB12, FOLATE, FERRITIN, TIBC, IRON, RETICCTPCT in the last 72 hours. Microbiology Recent Results (from the past 240 hour(s))  Culture, blood (routine x 2)     Status: None   Collection Time: 09/13/20  8:37 PM   Specimen: BLOOD LEFT WRIST  Result Value Ref Range Status   Specimen Description BLOOD LEFT WRIST  Final   Special Requests   Final    BOTTLES DRAWN AEROBIC AND  ANAEROBIC Blood Culture adequate volume   Culture   Final    NO GROWTH 5 DAYS Performed at [facility], [address], [place], [place] [address]    Report Status 09/18/2020 FINAL  Final  Culture, blood (routine x 2)     Status: None   Collection Time: 09/13/20  8:40 PM   Specimen: BLOOD  Result Value Ref Range Status   Specimen Description BLOOD RIGHT ANTECUBITAL  Final   Special Requests   Final    BOTTLES DRAWN AEROBIC AND ANAEROBIC Blood Culture adequate volume   Culture   Final    NO GROWTH 5 DAYS Performed at [facility], [place] [address], [place], [place] [address]    Report Status 09/18/2020 FINAL  Final  Blood culture (routine x 2)     Status: None (Preliminary result)   Collection Time: 09/17/20  1:00 AM   Specimen: BLOOD  Result Value Ref Range Status   Specimen Description BLOOD RIGHT ARM  Final   Special Requests   Final    BOTTLES DRAWN AEROBIC AND ANAEROBIC Blood Culture adequate volume   Culture   Final    NO GROWTH 2 DAYS Performed at [facility], [place] [address], [place], [place] [address]    Report Status PENDING  Incomplete  Blood culture (routine x 2)     Status: None (Preliminary result)   Collection Time: 09/17/20  1:16 AM   Specimen: BLOOD  Result Value Ref Range Status   Specimen Description BLOOD LEFT ARM  Final   Special Requests   Final    BOTTLES DRAWN AEROBIC ONLY Blood Culture adequate volume   Culture   Final    NO GROWTH 2 DAYS Performed at [facility], [place] [address] [place], [place] [address]    Report Status PENDING  Incomplete  Resp Panel by RT-PCR (Flu A&B, Covid) Nasopharyngeal Swab     Status: None   Collection Time: 09/17/20  1:20 AM   Specimen: Nasopharyngeal Swab; Nasopharyngeal(NP) swabs in vial transport medium  Result Value Ref Range Status   SARS Coronavirus 2 by RT PCR NEGATIVE NEGATIVE Final    Comment: (NOTE) SARS-CoV-2 target nucleic acids are NOT DETECTED.  The SARS-CoV-2 RNA is generally  detectable in upper respiratory specimens during the acute phase of infection. The lowest concentration of SARS-CoV-2 viral copies this assay can detect is 138 copies/mL. A negative result does not preclude SARS-Cov-2 infection and should not be used as the  sole basis for treatment or other patient management decisions. A negative result may occur with  improper specimen collection/handling, submission of specimen other than nasopharyngeal swab, presence of viral mutation(s) within the areas targeted by this assay, and inadequate number of viral copies(<138 copies/mL). A negative result must be combined with clinical observations, patient history, and epidemiological information. The expected result is Negative.  Fact Sheet for Patients:  EntrepreneurPulse.com.au  Fact Sheet for Healthcare Providers:  IncredibleEmployment.be  This test is no t yet approved or cleared by the [place] FDA and  has been authorized for detection and/or diagnosis of SARS-CoV-2 by FDA under an Emergency Use Authorization (EUA). This EUA will remain  in effect (meaning this test can be used) for the duration of the COVID-19 declaration under Section 564(b)(1) of the Act, 21 U.S.C.section 360bbb-3(b)(1), unless the authorization is terminated  or revoked sooner.       Influenza A by PCR NEGATIVE NEGATIVE Final   Influenza B by PCR NEGATIVE NEGATIVE Final    Comment: (NOTE) The Xpert Xpress SARS-CoV-2/FLU/RSV plus assay is intended as an aid in the diagnosis of influenza from Nasopharyngeal swab specimens and should not be used as a sole basis for treatment. Nasal washings and aspirates are unacceptable for Xpert Xpress SARS-CoV-2/FLU/RSV testing.  Fact Sheet for Patients: EntrepreneurPulse.com.au  Fact Sheet for Healthcare Providers: IncredibleEmployment.be  This test is not yet approved or cleared by the [place] FDA  and has been authorized for detection and/or diagnosis of SARS-CoV-2 by FDA under an Emergency Use Authorization (EUA). This EUA will remain in effect (meaning this test can be used) for the duration of the COVID-19 declaration under Section 564(b)(1) of the Act, 21 U.S.C. section 360bbb-3(b)(1), unless the authorization is terminated or revoked.  Performed at [facility], [place] [address], [place], [place] [address]      Discharge Instructions:   Discharge Instructions     Call MD for:  redness, tenderness, or signs of infection (pain, swelling, redness, odor or green/yellow discharge around incision site)   Complete by: As directed    Call MD for:  severe uncontrolled pain   Complete by: As directed    Call MD for:  temperature >100.4   Complete by: As directed    Diet - low sodium heart healthy   Complete by: As directed    Diet Carb Modified   Complete by: As directed    Discharge instructions   Complete by: As directed    Follow-up with Dr Sharol Given orthopedics within 1 week.  Complete the course of antibiotic.  Use redistribution shoe when ambulating.  Try to keep off pressure of the heel.   Discharge wound care:   Complete by: As directed    Wound care to left foot, plantar aspect, serum-filled blisters: Cleanse foot with soap and water, rinse and pat dry. Cover with xeroform gauze, apply Eucerin to foot and LE. Cover xeroform with dry gauze and secure with Kerlix. Place foot into [employer].  Paint right amputation stapled surgical site with betadine swabstick and allow to air dry. No dressing.  Elevate limb on pillow.   Increase activity slowly   Complete by: As directed       Allergies as of 09/19/2020   No Known Allergies      Medication List     TAKE these medications    acetaminophen 500 MG tablet Commonly known as: TYLENOL Take 1,000 mg by mouth every 6 (six) hours as needed for mild  pain or headache.   allopurinol 100 MG tablet Commonly known  as: ZYLOPRIM Take 1 tablet (100 mg total) by mouth 2 (two) times daily. What changed:  when to take this reasons to take this   amoxicillin-clavulanate 875-125 MG tablet Commonly known as: Augmentin Take 1 tablet by mouth 2 (two) times daily for 7 days.   BD Pen Needle Nano U/F 32G X 4 MM Misc Generic drug: Insulin Pen Needle Use as directed   Insulin Pen Needle 32G X 4 MM Misc Use as directed   docusate sodium 100 MG capsule Commonly known as: COLACE Take 1 capsule (100 mg total) by mouth daily. What changed:  when to take this reasons to take this   doxycycline 100 MG tablet Commonly known as: VIBRA-TABS Take 1 tablet (100 mg total) by mouth 2 (two) times daily for 7 days.   FeroSul 325 (65 FE) MG tablet Generic drug: ferrous sulfate Take 1 tablet (325 mg total) by mouth 3 (three) times daily after meals.   gabapentin 300 MG capsule Commonly known as: NEURONTIN Take 1 capsule (300 mg total) by mouth 3 (three) times daily.   Lantus SoloStar 100 UNIT/ML Solostar Pen Generic drug: insulin glargine Inject 25 Units into the skin 2 (two) times daily.   losartan 100 MG tablet Commonly known as: COZAAR Take 100 mg by mouth daily.   losartan-hydrochlorothiazide 100-25 MG tablet Commonly known as: HYZAAR Take 1 tablet by mouth daily.   metoprolol tartrate 25 MG tablet Commonly known as: LOPRESSOR Take 0.5 tablets (12.5 mg total) by mouth 2 (two) times daily.   multivitamin with minerals Tabs tablet Take 1 tablet by mouth daily.   NovoLOG FlexPen 100 UNIT/ML FlexPen Generic drug: insulin aspart Inject 4 Units into the skin 3 (three) times daily with meals.   Oxycodone HCl 10 MG Tabs Take 1-1.5 tablets (10-15 mg total) by mouth every 4 (four) hours as needed (pain score 7-10).   tiZANidine 4 MG capsule Commonly known as: ZANAFLEX Take 4 mg by mouth daily as needed for muscle spasms.   vitamin C 1000 MG tablet Take 1 tablet (1,000 mg total) by mouth daily.    Zinc Sulfate 220 (50 Zn) MG Tabs Take 1 tablet (220 mg total) by mouth daily.               Discharge Care Instructions  (From admission, onward)           Start     Ordered   09/19/20 0000  Discharge wound care:       Comments: Wound care to left foot, plantar aspect, serum-filled blisters: Cleanse foot with soap and water, rinse and pat dry. Cover with xeroform gauze, apply Eucerin to foot and LE. Cover xeroform with dry gauze and secure with Kerlix. Place foot into [employer].  Paint right amputation stapled surgical site with betadine swabstick and allow to air dry. No dressing.  Elevate limb on pillow.   09/19/20 1133            Follow-up Information     [facility], [facility]. Schedule an appointment as soon as possible for a visit in 1 week(s).   Specialty: Internal Medicine Why: Blood work, regular checkup Contact information: [place] [address] [phone]         [name], MD. Schedule an appointment as soon as possible for a visit in 1 week(s).   Specialty: Orthopedic Surgery Why: For foot infection/amputation follow-up. Contact information: [id number]  [place] [address] [phone]                  Time coordinating discharge: 39 minutes  Signed:  Daria Mcmeekin  [facility] 09/19/2020, 11:34 AM

## 2020-09-19 NOTE — Progress Notes (Signed)
Initial Nutrition Assessment  DOCUMENTATION CODES:   Obesity unspecified  INTERVENTION:   -MVI with minerals daily -Magic cup TID with meals, each supplement provides 290 kcal and 9 grams of protein  -Double protein portions with meals  NUTRITION DIAGNOSIS:   Increased nutrient needs related to wound healing as evidenced by estimated needs.  GOAL:   Patient will meet greater than or equal to 90% of their needs  MONITOR:   PO intake, Supplement acceptance, I & O's, Labs, Weight trends, Skin  REASON FOR ASSESSMENT:   Consult Wound healing  ASSESSMENT:   This  is a 55 y.o. 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  Pt admitted with DM foot infection (lt heel wound).   Reviewed I/O's: +3.6 L x 24 hours  Per MD notes, MRI of lt foot is negative for osteomyelitis and abscess.   Pt sleeping soundly at time of visit and did not arouse to voice or touch.  Per meal completions records, PO 100%.   Reviewed wt hx; pt has experienced a 1.2% wt loss over the past 3 months, which is not significant for time frame.   Pt with increased nutritional needs for wound healing and would benefit from addition of oral nutrition supplements.   Medications reviewed and include colace, ferrous sulfate, and 0.9% sodium chloride infusion @ 100 ml/hr.   Lab Results  Component Value Date   HGBA1C 7.8 (H) 08/27/2020   PTA DM medications are 4 units insulin aspart TID with meals and 25 units insulin glargine daily.   Labs reviewed: CBGS: 107-143 (inpatient orders for glycemic control are 0-5 units insulin aspart daily at bedtime, 0-15 units insulin aspart TID with meals, and 25 units insulin glargine-yfgn daily at bedtime).    NUTRITION - FOCUSED PHYSICAL EXAM:  Flowsheet Row Most Recent Value  Orbital Region No depletion  Upper Arm Region No depletion  Thoracic  and Lumbar Region No depletion  Buccal Region No depletion  Temple Region No depletion  Clavicle Bone Region No depletion  Clavicle and Acromion Bone Region No depletion  Scapular Bone Region No depletion  Dorsal Hand No depletion  Patellar Region No depletion  Anterior Thigh Region No depletion  Posterior Calf Region No depletion  Edema (RD Assessment) Mild  Hair Reviewed  Eyes Reviewed  Mouth Reviewed  Skin Reviewed  Nails Reviewed       Diet Order:   Diet Order             Diet - low sodium heart healthy           Diet Carb Modified           Diet heart healthy/carb modified Room service appropriate? Yes; Fluid consistency: Thin  Diet effective now                   EDUCATION NEEDS:   No education needs have been identified at this time  Skin:  Skin Assessment: Skin Integrity Issues: Skin Integrity Issues:: Other (Comment), Incisions Diabetic Ulcer: lt foot Incisions: s/p rt BKA Other: MASD groin, armpits, and groin  Last BM:  09/16/20  Height:   Ht Readings from Last 1 Encounters:  09/17/20 '6\' 4"'$  (1.93 m)    Weight:   Wt Readings from Last 1 Encounters:  09/17/20 (!) 144 kg    Ideal Body Weight:  85.6 kg (adjusted for rt  BKA)  BMI:  Body mass index is 38.64 kg/m.  Estimated Nutritional Needs:   Kcal:  2400-2600  Protein:  125-150 grams  Fluid:  > 2 L    Loistine Chance, RD, LDN, [place] Registered Dietitian II Certified Diabetes Care and Education Specialist Please refer to [facility] for RD and/or RD on-call/weekend/after hours pager

## 2020-09-19 NOTE — Progress Notes (Signed)
Patient's ride came at [facility]. Patient was already packed up and ready to leave.  Day shift already took iv out and patient was given information packet from day shift RN.  Patient left unit in wheel chair with NT and was discharged.  Around 2010 patient returned to the unit complaining that he can not find one of his credit cards that was located in his pant pocket.  He spoke with the charge nurse regarding this matter.  Patient was advised to contact his credit card company immediately and have the card cancelled/replaced since he is not able to locate his credit card.

## 2020-09-19 NOTE — Progress Notes (Signed)
Pt waiting for his ride home at this time.Pt report given to night shift.

## 2020-09-22 ENCOUNTER — Emergency Department (HOSPITAL_COMMUNITY): Payer: Medicaid Other

## 2020-09-22 ENCOUNTER — Ambulatory Visit: Payer: Self-pay | Admitting: Orthopedic Surgery

## 2020-09-22 ENCOUNTER — Encounter (HOSPITAL_COMMUNITY): Payer: Self-pay

## 2020-09-22 ENCOUNTER — Emergency Department (HOSPITAL_COMMUNITY)
Admission: EM | Admit: 2020-09-22 | Discharge: 2020-09-22 | Disposition: A | Discharge status: Home / Self Care | Payer: Medicaid Other | Attending: Emergency Medicine | Admitting: Emergency Medicine

## 2020-09-22 ENCOUNTER — Other Ambulatory Visit: Payer: Self-pay

## 2020-09-22 DIAGNOSIS — W19XXXA Unspecified fall, initial encounter: Secondary | ICD-10-CM | POA: Diagnosis not present

## 2020-09-22 DIAGNOSIS — R079 Chest pain, unspecified: Secondary | ICD-10-CM | POA: Insufficient documentation

## 2020-09-22 DIAGNOSIS — Z8616 Personal history of COVID-19: Secondary | ICD-10-CM | POA: Diagnosis not present

## 2020-09-22 DIAGNOSIS — I129 Hypertensive chronic kidney disease with stage 1 through stage 4 chronic kidney disease, or unspecified chronic kidney disease: Secondary | ICD-10-CM | POA: Insufficient documentation

## 2020-09-22 DIAGNOSIS — Z794 Long term (current) use of insulin: Secondary | ICD-10-CM | POA: Insufficient documentation

## 2020-09-22 DIAGNOSIS — R109 Unspecified abdominal pain: Secondary | ICD-10-CM | POA: Insufficient documentation

## 2020-09-22 DIAGNOSIS — Z79899 Other long term (current) drug therapy: Secondary | ICD-10-CM | POA: Insufficient documentation

## 2020-09-22 DIAGNOSIS — E119 Type 2 diabetes mellitus without complications: Secondary | ICD-10-CM | POA: Insufficient documentation

## 2020-09-22 DIAGNOSIS — N183 Chronic kidney disease, stage 3 unspecified: Secondary | ICD-10-CM | POA: Insufficient documentation

## 2020-09-22 LAB — BASIC METABOLIC PANEL
Anion gap: 11 (ref 5–15)
BUN: 19 mg/dL (ref 6–20)
CO2: 23 mmol/L (ref 22–32)
Calcium: 8.8 mg/dL — ABNORMAL LOW (ref 8.9–10.3)
Chloride: 105 mmol/L (ref 98–111)
Creatinine, Ser: 1.4 mg/dL — ABNORMAL HIGH (ref 0.61–1.24)
GFR, Estimated: >60 mL/min (ref >60)
Glucose, Bld: 142 mg/dL — ABNORMAL HIGH (ref 70–99)
Potassium: 3.6 mmol/L (ref 3.5–5.1)
Sodium: 139 mmol/L (ref 135–145)

## 2020-09-22 LAB — CULTURE, BLOOD (ROUTINE X 2)
Culture: NO GROWTH
Culture: NO GROWTH
Special Requests: ADEQUATE
Special Requests: ADEQUATE

## 2020-09-22 LAB — CBC WITH DIFFERENTIAL/PLATELET
Abs Immature Granulocytes: 0.09 10*3/uL — ABNORMAL HIGH (ref 0.00–0.07)
Basophils Absolute: 0.1 10*3/uL (ref 0.0–0.1)
Basophils Relative: 0 %
Eosinophils Absolute: 0.1 10*3/uL (ref 0.0–0.5)
Eosinophils Relative: 1 %
HCT: 24.4 % — ABNORMAL LOW (ref 39.0–52.0)
Hemoglobin: 7.2 g/dL — ABNORMAL LOW (ref 13.0–17.0)
Immature Granulocytes: 1 %
Lymphocytes Relative: 18 %
Lymphs Abs: 2.3 10*3/uL (ref 0.7–4.0)
MCH: 24.6 pg — ABNORMAL LOW (ref 26.0–34.0)
MCHC: 29.5 g/dL — ABNORMAL LOW (ref 30.0–36.0)
MCV: 83.3 fL (ref 80.0–100.0)
Monocytes Absolute: 0.9 10*3/uL (ref 0.1–1.0)
Monocytes Relative: 7 %
Neutro Abs: 9.4 10*3/uL — ABNORMAL HIGH (ref 1.7–7.7)
Neutrophils Relative %: 73 %
Platelets: 282 10*3/uL (ref 150–400)
RBC: 2.93 MIL/uL — ABNORMAL LOW (ref 4.22–5.81)
RDW: 17.9 % — ABNORMAL HIGH (ref 11.5–15.5)
WBC: 12.9 10*3/uL — ABNORMAL HIGH (ref 4.0–10.5)
nRBC: 0 % (ref 0.0–0.2)

## 2020-09-22 LAB — CBG MONITORING, ED: Glucose-Capillary: 142 mg/dL — ABNORMAL HIGH (ref 70–99)

## 2020-09-22 MED ORDER — MORPHINE SULFATE (PF) 4 MG/ML IV SOLN
4.0000 mg | Freq: Once | INTRAVENOUS | Status: AC
  Administered 2020-09-22: 4 mg via INTRAVENOUS
  Filled 2020-09-22: qty 1

## 2020-09-22 MED ORDER — ONDANSETRON HCL 4 MG/2ML IJ SOLN
4.0000 mg | Freq: Once | INTRAMUSCULAR | Status: AC
  Administered 2020-09-22: 4 mg via INTRAVENOUS
  Filled 2020-09-22: qty 2

## 2020-09-22 MED ORDER — HYDROCODONE-ACETAMINOPHEN 5-325 MG PO TABS
1.0000 | ORAL_TABLET | Freq: Once | ORAL | Status: AC
  Administered 2020-09-22: 1 via ORAL
  Filled 2020-09-22: qty 1

## 2020-09-22 NOTE — Discharge Instructions (Addendum)
You are seen today for right-sided pain.  Your work-up is reassuring.  Given your pain with transfers and certain movements, this could be musculoskeletal.  A short course of indomethacin as you have at home would be reasonable.  You may also try a muscle relaxer.

## 2020-09-22 NOTE — ED Triage Notes (Signed)
Pt had fall 6 hrs ago and now complains of right flank pain and right abdomen. Pt is a new amputee (right BKA).   170/100 bp 110 hr  99% room air  20 respirations

## 2020-09-22 NOTE — ED Provider Notes (Signed)
[facility] DEPT Provider Note   CSN: [id number] Arrival date & time: 09/22/20  0111     History Chief Complaint  Patient presents with   Fall   Flank Pain    Patrick [name] is a 55 y.o. male.  HPI     This a 55 year old male with a history of diabetes, hypertension, peripheral vascular disease status post BKA who presents following a fall.  Patient reports that since his BKA he has had multiple mechanical falls while transferring.  He reports he fell prior to arrival.  He is complaining of right flank and abdominal pain.  No nausea, vomiting.  No chest pain or shortness of breath.  He is not on any anticoagulation.  Rates his pain a 10 out of 10.  He does not take anything for his pain.  Past Medical History:  Diagnosis Date   Diabetes mellitus    Gout    History of COVID-19 04/2019   Hypertension    PVD (peripheral vascular disease) ([place])    s/p R BKA    Patient Active Problem List   Diagnosis Date Noted   S/P BKA (below knee amputation) unilateral, right ([place]) 09/01/2020   ABLA (acute blood loss anemia) 09/01/2020   Anemia 08/27/2020   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 ([place]) 07/20/2020   Dehiscence of amputation stump ([place]) 07/20/2020   Cutaneous abscess of right foot    Osteomyelitis of right foot ([facility])    Severe protein-calorie malnutrition ([place])    Sepsis due to cellulitis ([place]) 06/04/2020   Diabetic foot ulcer ([place]) 06/04/2020   Diabetic foot infection ([place]) 06/04/2020   CKD (chronic kidney disease) stage 3, GFR 30-59 ml/min ([place]) 06/04/2020   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[place]) 06/04/2020   Acute respiratory disease due to COVID-19 virus 05/03/2019   Renal insufficiency 05/03/2019   GOUT 01/27/2010   OBESITY 01/27/2010   Insulin-requiring or dependent type II diabetes mellitus ([place]) 02/20/2008   HYPERTENSION, BENIGN ESSENTIAL 02/20/2008    Past Surgical History:  Procedure Laterality Date   AMPUTATION  Right 06/06/2020   Procedure: AMPUTATION 5TH RAY;  Surgeon: [name], MD;  Location: [facility];  Service: Orthopedics;  Laterality: Right;   AMPUTATION Right 06/08/2020   Procedure: RIGHT 4TH RAY AMPUTATION;  Surgeon: [name], MD;  Location: [facility];  Service: Orthopedics;  Laterality: Right;   AMPUTATION Right 08/27/2020   Procedure: AMPUTATION BELOW KNEE;  Surgeon: Jessy Oto, MD;  Location: [facility];  Service: Orthopedics;  Laterality: Right;       Family History  Problem Relation Age of Onset   Diabetes Neg Hx     Social History   Tobacco Use   Smoking status: Never   Smokeless tobacco: Never  Vaping Use   Vaping Use: Never used  Substance Use Topics   Alcohol use: No   Drug use: No    Home Medications Prior to Admission medications   Medication Sig Start Date End Date Taking? Authorizing Provider  acetaminophen (TYLENOL) 500 MG tablet Take 1,000 mg by mouth every 6 (six) hours as needed for mild pain or headache.    [provider]  allopurinol (ZYLOPRIM) 100 MG tablet Take 1 tablet (100 mg total) by mouth 2 (two) times daily. 09/08/20   Angiulli, Lavon Paganini, PA-C  amoxicillin-clavulanate (AUGMENTIN) 875-125 MG tablet Take 1 tablet by mouth 2 (two) times daily for 7 days. 09/19/20 09/26/20  Flora Lipps, MD  ascorbic acid (VITAMIN C) 1000 MG tablet Take 1 tablet (1,000 mg total) by mouth daily. 09/08/20   Angiulli, Lavon Paganini, PA-C  docusate sodium (COLACE) 100 MG capsule Take 1 capsule (100 mg total) by mouth daily. 09/02/20   Ezekiel Slocumb, DO  doxycycline (VIBRA-TABS) 100 MG tablet Take 1 tablet (100 mg total) by mouth 2 (two) times daily for 7 days. 09/19/20 09/26/20  Pokhrel, Corrie Mckusick, MD  ferrous sulfate 325 (65 FE) MG tablet Take 1 tablet (325 mg total) by mouth 3 (three) times daily after meals. 09/08/20   Angiulli, Lavon Paganini, PA-C  gabapentin (NEURONTIN) 300 MG capsule Take 1 capsule (300 mg total) by mouth 3 (three) times daily. 09/08/20   Angiulli, Lavon Paganini, PA-C   insulin aspart (NOVOLOG FLEXPEN) 100 UNIT/ML FlexPen Inject 4 Units into the skin 3 (three) times daily with meals. 09/08/20   Angiulli, Lavon Paganini, PA-C  insulin glargine (LANTUS SOLOSTAR) 100 UNIT/ML Solostar Pen Inject 25 Units into the skin 2 (two) times daily. 09/08/20   Angiulli, Lavon Paganini, PA-C  Insulin Pen Needle 32G X 4 MM MISC Use as directed 09/08/20   [name], Clide Deutscher, MD  Insulin Pen Needle 32G X 4 MM MISC Use as directed 09/09/20   Angiulli, Lavon Paganini, PA-C  losartan (COZAAR) 100 MG tablet Take 100 mg by mouth daily.    [provider]  losartan-hydrochlorothiazide (HYZAAR) 100-25 MG tablet Take 1 tablet by mouth daily.    [provider]  metoprolol tartrate (LOPRESSOR) 25 MG tablet Take 0.5 tablets (12.5 mg total) by mouth 2 (two) times daily. 09/08/20   Angiulli, Lavon Paganini, PA-C  Multiple Vitamin (MULTIVITAMIN WITH MINERALS) TABS tablet Take 1 tablet by mouth daily. 09/02/20   Ezekiel Slocumb, DO  Oxycodone HCl 10 MG TABS Take 1-1.5 tablets (10-15 mg total) by mouth every 4 (four) hours as needed (pain score 7-10). 09/08/20   Angiulli, Lavon Paganini, PA-C  tiZANidine (ZANAFLEX) 4 MG capsule Take 4 mg by mouth daily as needed for muscle spasms.    [provider]  Zinc Sulfate 220 (50 Zn) MG TABS Take 1 tablet (220 mg total) by mouth daily. 09/08/20   Angiulli, Lavon Paganini, PA-C    Allergies    Patient has no known allergies.  Review of Systems   Review of Systems  Constitutional:  Negative for fever.  Respiratory:  Negative for shortness of breath.   Cardiovascular:  Negative for chest pain.  Gastrointestinal:  Positive for abdominal pain and nausea. Negative for diarrhea and vomiting.  Genitourinary:  Positive for dysuria.  All other systems reviewed and are negative.  Physical Exam Updated Vital Signs BP (!) 174/103   Pulse (!) 110   Temp 99 F (37.2 C) (Oral)   Resp 19   SpO2 98%   Physical Exam Vitals and nursing note reviewed.  Constitutional:       Appearance: He is well-developed. He is obese. He is not ill-appearing.  HENT:     Head: Normocephalic and atraumatic.     Mouth/Throat:     Mouth: Mucous membranes are moist.  Eyes:     Pupils: Pupils are equal, round, and reactive to light.  Cardiovascular:     Rate and Rhythm: Normal rate and regular rhythm.     Heart sounds: Normal heart sounds. No murmur heard. Pulmonary:     Effort: Pulmonary effort is normal. No respiratory distress.     Breath sounds: Normal breath sounds. No wheezing.  Abdominal:  General: Bowel sounds are normal. There is no distension.     Palpations: Abdomen is soft.     Tenderness: There is abdominal tenderness. There is right CVA tenderness. There is no guarding or rebound.  Musculoskeletal:     Cervical back: Neck supple.     Right lower leg: No edema.     Left lower leg: No edema.     Comments: Right BKA  Lymphadenopathy:     Cervical: No cervical adenopathy.  Skin:    General: Skin is warm and dry.  Neurological:     Mental Status: He is alert and oriented to person, place, and time.  Psychiatric:        Mood and Affect: Mood normal.    ED Results / Procedures / Treatments   Labs (all labs ordered are listed, but only abnormal results are displayed) Labs Reviewed  CBC WITH DIFFERENTIAL/PLATELET - Abnormal; Notable for the following components:      Result Value   WBC 12.9 (*)    RBC 2.93 (*)    Hemoglobin 7.2 (*)    HCT 24.4 (*)    MCH 24.6 (*)    MCHC 29.5 (*)    RDW 17.9 (*)    Neutro Abs 9.4 (*)    Abs Immature Granulocytes 0.09 (*)    All other components within normal limits  BASIC METABOLIC PANEL - Abnormal; Notable for the following components:   Glucose, Bld 142 (*)    Creatinine, Ser 1.40 (*)    Calcium 8.8 (*)    All other components within normal limits  CBG MONITORING, ED - Abnormal; Notable for the following components:   Glucose-Capillary 142 (*)    All other components within normal limits     EKG None  Radiology DG Chest Portable 1 View  Result Date: 09/22/2020 CLINICAL DATA:  Fall a few hours ago with right rib pain EXAM: PORTABLE CHEST 1 VIEW COMPARISON:  09/17/2020 FINDINGS: Chronic cardiomegaly and aortic tortuosity. There is no edema, consolidation, effusion, or pneumothorax. No visible fracture. Advanced left glenohumeral osteoarthritis. IMPRESSION: Stable exam.  No acute finding. Electronically Signed   By: Monte Fantasia M.D.   On: 09/22/2020 04:27   CT Renal Stone Study  Result Date: 09/22/2020 CLINICAL DATA:  55 year old male with history of right-sided flank pain. Three recent falls since 08/27/2020. EXAM: CT ABDOMEN AND PELVIS WITHOUT CONTRAST TECHNIQUE: Multidetector CT imaging of the abdomen and pelvis was performed following the standard protocol without IV contrast. COMPARISON:  No priors. FINDINGS: Lower chest: Atherosclerotic calcifications in the right coronary artery. Hepatobiliary: No discrete cystic or solid hepatic lesions confidently identified on today's noncontrast CT examination. Numerous partially calcified gallstones lie dependently in the gallbladder. Gallbladder is moderately distended. No definite pericholecystic fluid or inflammatory changes are confidently identified. Pancreas: No definite pancreatic mass or peripancreatic fluid collections or inflammatory changes noted on today's noncontrast CT examination. Spleen: Small splenule inferior to the spleen. Otherwise, unremarkable. Adrenals/Urinary Tract: There are no abnormal calcifications within the collecting system of either kidney, along the course of either ureter, or within the lumen of the urinary bladder. No hydroureteronephrosis to suggest urinary tract obstruction at this time. Bilateral perinephric stranding is noted, but nonspecific. The unenhanced appearance of the kidneys is unremarkable bilaterally. Unenhanced appearance of the urinary bladder is normal. Bilateral adrenal glands are normal in  appearance. Stomach/Bowel: Unenhanced appearance of the stomach is normal. No pathologic dilatation of small bowel or colon. Normal appendix. Vascular/Lymphatic: Mild atherosclerotic calcifications in the  abdominal aorta. No lymphadenopathy noted in the abdomen or pelvis. Reproductive: Prostate gland and seminal vesicles are unremarkable in appearance. Other: No significant volume of ascites.  No pneumoperitoneum. Musculoskeletal: There are no aggressive appearing lytic or blastic lesions noted in the visualized portions of the skeleton. IMPRESSION: 1. No acute findings are noted in the abdomen or pelvis to account for the patient's symptoms. 2. Cholelithiasis without definitive evidence to suggest an acute cholecystitis at this time. 3. Aortic atherosclerosis. 4. Additional incidental findings, as above. Electronically Signed   By: Vinnie Langton M.D.   On: 09/22/2020 06:53    Procedures Procedures   Medications Ordered in ED Medications  morphine 4 MG/ML injection 4 mg (4 mg Intravenous Given 09/22/20 0528)  ondansetron (ZOFRAN) injection 4 mg (4 mg Intravenous Given 09/22/20 0527)    ED Course  I have reviewed the triage vital signs and the nursing notes.  Pertinent labs & imaging results that were available during my care of the patient were reviewed by me and considered in my medical decision making (see chart for details).    MDM Rules/Calculators/A&P                           Patient presents with right-sided chest and flank pain.  He is overall nontoxic-appearing.  Vital signs notable for heart rate of 118.  He is not hypotensive.  He has reproducible tenderness over the right chest, flank, and abdomen.  Considerations include but not limited to, musculoskeletal etiology such as pulled muscle, rib fracture, could be related to something other than his fall such as kidney stone.  Lower suspicion for intra-abdominal pathology such as appendicitis.  Patient was given pain and nausea  medication.  Labs obtained.  Labs are largely at the patient's baseline.  Chest x-ray without pneumothorax or pneumonia.  CT stone study without stone or intra-abdominal pathology.  He does have gallstones but no evidence of cholecystitis.  On recheck, patient appears much more comfortable.  He reports ongoing throbbing pain that is worse with movement.  Highly suspect musculoskeletal etiology.  He has indomethacin, oxycodone, and tizanidine at home.  Recommend a short course of indomethacin and muscle relaxants as needed.  After history, exam, and medical workup I feel the patient has been appropriately medically screened and is safe for discharge home. Pertinent diagnoses were discussed with the patient. Patient was given return precautions.  Final Clinical Impression(s) / ED Diagnoses Final diagnoses:  Flank pain    Rx / DC Orders ED Discharge Orders     None        Taleah Bellantoni, Barbette Hair, MD 09/22/20 [phone]

## 2020-09-22 NOTE — ED Notes (Signed)
Patient is a difficult stick. X 3 attempts made by this RN to obtain IV access.

## 2020-09-22 NOTE — ED Notes (Signed)
[facility] contacted and agreeable to transport pt to Extended Stay on [facility] for d/t as pt does not have his wheelchair or any other safe means of transport home.

## 2020-09-24 ENCOUNTER — Emergency Department (HOSPITAL_COMMUNITY): Payer: Medicaid Other

## 2020-09-24 ENCOUNTER — Inpatient Hospital Stay (HOSPITAL_COMMUNITY)
Admission: EM | Admit: 2020-09-24 | Discharge: 2020-09-29 | DRG: 418 | Disposition: A | Discharge status: Home Health | Payer: Medicaid Other | Attending: Internal Medicine | Admitting: Internal Medicine

## 2020-09-24 DIAGNOSIS — K5900 Constipation, unspecified: Secondary | ICD-10-CM | POA: Diagnosis present

## 2020-09-24 DIAGNOSIS — E8770 Fluid overload, unspecified: Secondary | ICD-10-CM | POA: Diagnosis present

## 2020-09-24 DIAGNOSIS — E669 Obesity, unspecified: Secondary | ICD-10-CM | POA: Diagnosis present

## 2020-09-24 DIAGNOSIS — E1140 Type 2 diabetes mellitus with diabetic neuropathy, unspecified: Secondary | ICD-10-CM | POA: Diagnosis present

## 2020-09-24 DIAGNOSIS — E11628 Type 2 diabetes mellitus with other skin complications: Secondary | ICD-10-CM

## 2020-09-24 DIAGNOSIS — E1122 Type 2 diabetes mellitus with diabetic chronic kidney disease: Secondary | ICD-10-CM | POA: Diagnosis present

## 2020-09-24 DIAGNOSIS — L97429 Non-pressure chronic ulcer of left heel and midfoot with unspecified severity: Secondary | ICD-10-CM | POA: Diagnosis present

## 2020-09-24 DIAGNOSIS — N183 Chronic kidney disease, stage 3 unspecified: Secondary | ICD-10-CM | POA: Diagnosis present

## 2020-09-24 DIAGNOSIS — Z89511 Acquired absence of right leg below knee: Secondary | ICD-10-CM

## 2020-09-24 DIAGNOSIS — I10 Essential (primary) hypertension: Secondary | ICD-10-CM | POA: Diagnosis present

## 2020-09-24 DIAGNOSIS — M109 Gout, unspecified: Secondary | ICD-10-CM | POA: Diagnosis present

## 2020-09-24 DIAGNOSIS — Z6836 Body mass index (BMI) 36.0-36.9, adult: Secondary | ICD-10-CM

## 2020-09-24 DIAGNOSIS — E11621 Type 2 diabetes mellitus with foot ulcer: Secondary | ICD-10-CM | POA: Diagnosis present

## 2020-09-24 DIAGNOSIS — D75839 Thrombocytosis, unspecified: Secondary | ICD-10-CM | POA: Diagnosis present

## 2020-09-24 DIAGNOSIS — L089 Local infection of the skin and subcutaneous tissue, unspecified: Secondary | ICD-10-CM

## 2020-09-24 DIAGNOSIS — K810 Acute cholecystitis: Secondary | ICD-10-CM

## 2020-09-24 DIAGNOSIS — Z20822 Contact with and (suspected) exposure to covid-19: Secondary | ICD-10-CM | POA: Diagnosis present

## 2020-09-24 DIAGNOSIS — E1151 Type 2 diabetes mellitus with diabetic peripheral angiopathy without gangrene: Secondary | ICD-10-CM | POA: Diagnosis present

## 2020-09-24 DIAGNOSIS — D649 Anemia, unspecified: Secondary | ICD-10-CM | POA: Diagnosis present

## 2020-09-24 DIAGNOSIS — K8000 Calculus of gallbladder with acute cholecystitis without obstruction: Principal | ICD-10-CM

## 2020-09-24 DIAGNOSIS — Z79899 Other long term (current) drug therapy: Secondary | ICD-10-CM

## 2020-09-24 DIAGNOSIS — Z794 Long term (current) use of insulin: Secondary | ICD-10-CM

## 2020-09-24 DIAGNOSIS — I160 Hypertensive urgency: Secondary | ICD-10-CM | POA: Diagnosis present

## 2020-09-24 DIAGNOSIS — E1165 Type 2 diabetes mellitus with hyperglycemia: Secondary | ICD-10-CM | POA: Diagnosis present

## 2020-09-24 DIAGNOSIS — N1832 Chronic kidney disease, stage 3b: Secondary | ICD-10-CM | POA: Diagnosis present

## 2020-09-24 DIAGNOSIS — I129 Hypertensive chronic kidney disease with stage 1 through stage 4 chronic kidney disease, or unspecified chronic kidney disease: Secondary | ICD-10-CM | POA: Diagnosis present

## 2020-09-24 DIAGNOSIS — D509 Iron deficiency anemia, unspecified: Secondary | ICD-10-CM | POA: Diagnosis present

## 2020-09-24 DIAGNOSIS — Z8616 Personal history of COVID-19: Secondary | ICD-10-CM

## 2020-09-24 HISTORY — DX: Anemia, unspecified: D64.9

## 2020-09-24 NOTE — ED Triage Notes (Signed)
Pt brought to ED with c/o rt side of abdomen x week. Endorses SOB. Pt also has edema to LLE, states swelling has been ongoing for some time but has noticed recent development of ulcers to heel.

## 2020-09-24 NOTE — ED Provider Notes (Signed)
Emergency Medicine Provider Triage Evaluation Note  Patrick [name] , a 55 y.o. male  was evaluated in triage.  Pt complains of right upper abdominal pain.  Reports associated back pain.  Denies any urinary complaints.  Denies any injuries.    Also states that he had a blister on his left foot that he needs dressed.  Review of Systems  Positive: Abdominal pain, flank pain Negative: Fever, chills  Physical Exam  There were no vitals taken for this visit. Gen:   Awake, no distress   Resp:  Normal effort  MSK:   Moves extremities without difficulty  Other:  RUQ TTP  Medical Decision Making  Medically screening exam initiated at 11:24 PM.  Appropriate orders placed.  Jolinda Croak was informed that the remainder of the evaluation will be completed by another provider, this initial triage assessment does not replace that evaluation, and the importance of remaining in the ED until their evaluation is complete.  Abdominal pain   Montine Circle, PA-C 09/24/20 2326    Fatima Blank, MD 09/25/20 [phone]

## 2020-09-25 ENCOUNTER — Encounter (HOSPITAL_COMMUNITY): Payer: Self-pay | Admitting: Internal Medicine

## 2020-09-25 ENCOUNTER — Encounter (HOSPITAL_COMMUNITY)
Admission: EM | Disposition: A | Discharge status: Home Health | Payer: Self-pay | Source: Home / Self Care | Attending: Family Medicine

## 2020-09-25 ENCOUNTER — Inpatient Hospital Stay (HOSPITAL_COMMUNITY): Payer: Medicaid Other | Admitting: Certified Registered Nurse Anesthetist

## 2020-09-25 ENCOUNTER — Emergency Department (HOSPITAL_COMMUNITY): Payer: Medicaid Other

## 2020-09-25 DIAGNOSIS — D649 Anemia, unspecified: Secondary | ICD-10-CM

## 2020-09-25 DIAGNOSIS — E1140 Type 2 diabetes mellitus with diabetic neuropathy, unspecified: Secondary | ICD-10-CM | POA: Diagnosis present

## 2020-09-25 DIAGNOSIS — D75839 Thrombocytosis, unspecified: Secondary | ICD-10-CM | POA: Diagnosis present

## 2020-09-25 DIAGNOSIS — I10 Essential (primary) hypertension: Secondary | ICD-10-CM

## 2020-09-25 DIAGNOSIS — K5900 Constipation, unspecified: Secondary | ICD-10-CM | POA: Diagnosis present

## 2020-09-25 DIAGNOSIS — Z89511 Acquired absence of right leg below knee: Secondary | ICD-10-CM | POA: Diagnosis not present

## 2020-09-25 DIAGNOSIS — Z8616 Personal history of COVID-19: Secondary | ICD-10-CM | POA: Diagnosis not present

## 2020-09-25 DIAGNOSIS — M10051 Idiopathic gout, right hip: Secondary | ICD-10-CM

## 2020-09-25 DIAGNOSIS — E8770 Fluid overload, unspecified: Secondary | ICD-10-CM | POA: Diagnosis present

## 2020-09-25 DIAGNOSIS — N1832 Chronic kidney disease, stage 3b: Secondary | ICD-10-CM | POA: Diagnosis present

## 2020-09-25 DIAGNOSIS — E11621 Type 2 diabetes mellitus with foot ulcer: Secondary | ICD-10-CM

## 2020-09-25 DIAGNOSIS — Z79899 Other long term (current) drug therapy: Secondary | ICD-10-CM | POA: Diagnosis not present

## 2020-09-25 DIAGNOSIS — K8000 Calculus of gallbladder with acute cholecystitis without obstruction: Principal | ICD-10-CM | POA: Diagnosis present

## 2020-09-25 DIAGNOSIS — N1831 Chronic kidney disease, stage 3a: Secondary | ICD-10-CM

## 2020-09-25 DIAGNOSIS — Z794 Long term (current) use of insulin: Secondary | ICD-10-CM | POA: Diagnosis not present

## 2020-09-25 DIAGNOSIS — Z6836 Body mass index (BMI) 36.0-36.9, adult: Secondary | ICD-10-CM | POA: Diagnosis not present

## 2020-09-25 DIAGNOSIS — I129 Hypertensive chronic kidney disease with stage 1 through stage 4 chronic kidney disease, or unspecified chronic kidney disease: Secondary | ICD-10-CM | POA: Diagnosis present

## 2020-09-25 DIAGNOSIS — E1165 Type 2 diabetes mellitus with hyperglycemia: Secondary | ICD-10-CM | POA: Diagnosis present

## 2020-09-25 DIAGNOSIS — D509 Iron deficiency anemia, unspecified: Secondary | ICD-10-CM | POA: Diagnosis present

## 2020-09-25 DIAGNOSIS — L97529 Non-pressure chronic ulcer of other part of left foot with unspecified severity: Secondary | ICD-10-CM

## 2020-09-25 DIAGNOSIS — R1013 Epigastric pain: Secondary | ICD-10-CM | POA: Diagnosis not present

## 2020-09-25 DIAGNOSIS — Z20822 Contact with and (suspected) exposure to covid-19: Secondary | ICD-10-CM | POA: Diagnosis present

## 2020-09-25 DIAGNOSIS — E1122 Type 2 diabetes mellitus with diabetic chronic kidney disease: Secondary | ICD-10-CM | POA: Diagnosis present

## 2020-09-25 DIAGNOSIS — I160 Hypertensive urgency: Secondary | ICD-10-CM | POA: Diagnosis present

## 2020-09-25 DIAGNOSIS — E669 Obesity, unspecified: Secondary | ICD-10-CM | POA: Diagnosis present

## 2020-09-25 DIAGNOSIS — E1151 Type 2 diabetes mellitus with diabetic peripheral angiopathy without gangrene: Secondary | ICD-10-CM | POA: Diagnosis present

## 2020-09-25 DIAGNOSIS — M109 Gout, unspecified: Secondary | ICD-10-CM | POA: Diagnosis present

## 2020-09-25 DIAGNOSIS — L97429 Non-pressure chronic ulcer of left heel and midfoot with unspecified severity: Secondary | ICD-10-CM | POA: Diagnosis present

## 2020-09-25 HISTORY — DX: Hypertensive urgency: I16.0

## 2020-09-25 HISTORY — PX: CHOLECYSTECTOMY: SHX55

## 2020-09-25 HISTORY — DX: Calculus of gallbladder with acute cholecystitis without obstruction: K80.00

## 2020-09-25 LAB — CBC WITH DIFFERENTIAL/PLATELET
Abs Immature Granulocytes: 0.13 10*3/uL — ABNORMAL HIGH (ref 0.00–0.07)
Basophils Absolute: 0.1 10*3/uL (ref 0.0–0.1)
Basophils Relative: 1 %
Eosinophils Absolute: 0.2 10*3/uL (ref 0.0–0.5)
Eosinophils Relative: 2 %
HCT: 27.3 % — ABNORMAL LOW (ref 39.0–52.0)
Hemoglobin: 8.3 g/dL — ABNORMAL LOW (ref 13.0–17.0)
Immature Granulocytes: 1 %
Lymphocytes Relative: 26 %
Lymphs Abs: 3.1 10*3/uL (ref 0.7–4.0)
MCH: 25.3 pg — ABNORMAL LOW (ref 26.0–34.0)
MCHC: 30.4 g/dL (ref 30.0–36.0)
MCV: 83.2 fL (ref 80.0–100.0)
Monocytes Absolute: 0.7 10*3/uL (ref 0.1–1.0)
Monocytes Relative: 6 %
Neutro Abs: 7.8 10*3/uL — ABNORMAL HIGH (ref 1.7–7.7)
Neutrophils Relative %: 64 %
Platelets: 506 10*3/uL — ABNORMAL HIGH (ref 150–400)
RBC: 3.28 MIL/uL — ABNORMAL LOW (ref 4.22–5.81)
RDW: 17.7 % — ABNORMAL HIGH (ref 11.5–15.5)
WBC: 12 10*3/uL — ABNORMAL HIGH (ref 4.0–10.5)
nRBC: 0 % (ref 0.0–0.2)

## 2020-09-25 LAB — COMPREHENSIVE METABOLIC PANEL
ALT: 13 U/L (ref 0–44)
AST: 15 U/L (ref 15–41)
Albumin: 2.5 g/dL — ABNORMAL LOW (ref 3.5–5.0)
Alkaline Phosphatase: 114 U/L (ref 38–126)
Anion gap: 7 (ref 5–15)
BUN: 13 mg/dL (ref 6–20)
CO2: 26 mmol/L (ref 22–32)
Calcium: 8.9 mg/dL (ref 8.9–10.3)
Chloride: 105 mmol/L (ref 98–111)
Creatinine, Ser: 1.28 mg/dL — ABNORMAL HIGH (ref 0.61–1.24)
GFR, Estimated: >60 mL/min (ref >60)
Glucose, Bld: 196 mg/dL — ABNORMAL HIGH (ref 70–99)
Potassium: 3.8 mmol/L (ref 3.5–5.1)
Sodium: 138 mmol/L (ref 135–145)
Total Bilirubin: 0.7 mg/dL (ref 0.3–1.2)
Total Protein: 7.1 g/dL (ref 6.5–8.1)

## 2020-09-25 LAB — GLUCOSE, CAPILLARY
Glucose-Capillary: 135 mg/dL — ABNORMAL HIGH (ref 70–99)
Glucose-Capillary: 180 mg/dL — ABNORMAL HIGH (ref 70–99)
Glucose-Capillary: 170 mg/dL — ABNORMAL HIGH (ref 70–99)

## 2020-09-25 LAB — RESP PANEL BY RT-PCR (FLU A&B, COVID) ARPGX2
Influenza A by PCR: NEGATIVE
Influenza B by PCR: NEGATIVE
SARS Coronavirus 2 by RT PCR: NEGATIVE

## 2020-09-25 LAB — URINALYSIS, ROUTINE W REFLEX MICROSCOPIC
Bacteria, UA: NONE SEEN
Bilirubin Urine: NEGATIVE
Glucose, UA: NEGATIVE mg/dL
Ketones, ur: NEGATIVE mg/dL
Leukocytes,Ua: NEGATIVE
Nitrite: NEGATIVE
Protein, ur: 100 mg/dL — AB
Specific Gravity, Urine: 1.017 (ref 1.005–1.030)
pH: 5 (ref 5.0–8.0)

## 2020-09-25 LAB — LIPASE, BLOOD: Lipase: 34 U/L (ref 11–51)

## 2020-09-25 SURGERY — LAPAROSCOPIC CHOLECYSTECTOMY
Anesthesia: General | Site: Abdomen

## 2020-09-25 MED ORDER — ALBUTEROL SULFATE (2.5 MG/3ML) 0.083% IN NEBU: 2.5000 mg | INHALATION_SOLUTION | Freq: Four times a day (QID) | RESPIRATORY_TRACT | Status: DC | PRN

## 2020-09-25 MED ORDER — CHLORHEXIDINE GLUCONATE 0.12 % MT SOLN
OROMUCOSAL | Status: AC
  Administered 2020-09-25: 15 mL via OROMUCOSAL
  Filled 2020-09-25: qty 15

## 2020-09-25 MED ORDER — ONDANSETRON HCL 4 MG PO TABS: 4.0000 mg | ORAL_TABLET | Freq: Four times a day (QID) | ORAL | Status: DC | PRN

## 2020-09-25 MED ORDER — HYDROMORPHONE HCL 1 MG/ML IJ SOLN
0.2500 mg | INTRAMUSCULAR | Status: DC | PRN
  Administered 2020-09-25 (×2): 0.5 mg via INTRAVENOUS

## 2020-09-25 MED ORDER — ALBUMIN HUMAN 5 % IV SOLN: INTRAVENOUS | Status: DC | PRN

## 2020-09-25 MED ORDER — LOSARTAN POTASSIUM 50 MG PO TABS
100.0000 mg | ORAL_TABLET | Freq: Every day | ORAL | Status: DC
  Administered 2020-09-25 – 2020-09-29 (×5): 100 mg via ORAL
  Filled 2020-09-25 (×5): qty 2

## 2020-09-25 MED ORDER — SODIUM CHLORIDE 0.9 % IV SOLN
2.0000 g | INTRAVENOUS | Status: DC
  Administered 2020-09-26 – 2020-09-27 (×2): 2 g via INTRAVENOUS
  Filled 2020-09-25 (×2): qty 2

## 2020-09-25 MED ORDER — INSULIN GLARGINE 100 UNIT/ML SOLOSTAR PEN: 25.0000 [IU] | PEN_INJECTOR | Freq: Two times a day (BID) | SUBCUTANEOUS | Status: DC

## 2020-09-25 MED ORDER — SODIUM CHLORIDE 0.9 % IV SOLN: 1.0000 g | Freq: Once | INTRAVENOUS | Status: DC

## 2020-09-25 MED ORDER — ZINC SULFATE 220 (50 ZN) MG PO CAPS
220.0000 mg | ORAL_CAPSULE | Freq: Every day | ORAL | Status: DC
  Administered 2020-09-26 – 2020-09-29 (×4): 220 mg via ORAL
  Filled 2020-09-25 (×4): qty 1

## 2020-09-25 MED ORDER — HYDRALAZINE HCL 20 MG/ML IJ SOLN
10.0000 mg | INTRAMUSCULAR | Status: DC | PRN
  Filled 2020-09-25: qty 1

## 2020-09-25 MED ORDER — PROPOFOL 10 MG/ML IV BOLUS
INTRAVENOUS | Status: AC
  Filled 2020-09-25: qty 40

## 2020-09-25 MED ORDER — METRONIDAZOLE 500 MG/100ML IV SOLN
500.0000 mg | Freq: Three times a day (TID) | INTRAVENOUS | Status: DC
  Administered 2020-09-25 – 2020-09-28 (×9): 500 mg via INTRAVENOUS
  Filled 2020-09-25 (×9): qty 100

## 2020-09-25 MED ORDER — MIDAZOLAM HCL 2 MG/2ML IJ SOLN
INTRAMUSCULAR | Status: DC | PRN
  Administered 2020-09-25: 2 mg via INTRAVENOUS

## 2020-09-25 MED ORDER — ONDANSETRON HCL 4 MG/2ML IJ SOLN
INTRAMUSCULAR | Status: DC | PRN
  Administered 2020-09-25: 4 mg via INTRAVENOUS

## 2020-09-25 MED ORDER — SUCCINYLCHOLINE CHLORIDE 200 MG/10ML IV SOSY
PREFILLED_SYRINGE | INTRAVENOUS | Status: DC | PRN
  Administered 2020-09-25: 140 mg via INTRAVENOUS

## 2020-09-25 MED ORDER — BUPIVACAINE HCL 0.25 % IJ SOLN
INTRAMUSCULAR | Status: DC | PRN
  Administered 2020-09-25: 30 mL

## 2020-09-25 MED ORDER — INSULIN ASPART 100 UNIT/ML IJ SOLN
0.0000 [IU] | Freq: Three times a day (TID) | INTRAMUSCULAR | Status: DC
  Administered 2020-09-26: 2 [IU] via SUBCUTANEOUS
  Administered 2020-09-27 – 2020-09-29 (×4): 1 [IU] via SUBCUTANEOUS

## 2020-09-25 MED ORDER — BUPIVACAINE-EPINEPHRINE (PF) 0.25% -1:200000 IJ SOLN
INTRAMUSCULAR | Status: AC
  Filled 2020-09-25: qty 30

## 2020-09-25 MED ORDER — SODIUM CHLORIDE 0.9 % IV SOLN
1.0000 g | Freq: Once | INTRAVENOUS | Status: AC
  Administered 2020-09-25: 1 g via INTRAVENOUS
  Filled 2020-09-25: qty 10

## 2020-09-25 MED ORDER — PROPOFOL 10 MG/ML IV BOLUS
INTRAVENOUS | Status: DC | PRN
  Administered 2020-09-25: 200 mg via INTRAVENOUS

## 2020-09-25 MED ORDER — HYDROMORPHONE HCL 1 MG/ML IJ SOLN
INTRAMUSCULAR | Status: AC
  Filled 2020-09-25: qty 1

## 2020-09-25 MED ORDER — MORPHINE SULFATE (PF) 2 MG/ML IV SOLN: 2.0000 mg | INTRAVENOUS | Status: DC | PRN

## 2020-09-25 MED ORDER — ENOXAPARIN SODIUM 80 MG/0.8ML IJ SOSY
70.0000 mg | PREFILLED_SYRINGE | INTRAMUSCULAR | Status: DC
  Administered 2020-09-26 – 2020-09-29 (×4): 70 mg via SUBCUTANEOUS
  Filled 2020-09-25 (×4): qty 0.8

## 2020-09-25 MED ORDER — SUGAMMADEX SODIUM 200 MG/2ML IV SOLN
INTRAVENOUS | Status: DC | PRN
  Administered 2020-09-25: 300 mg via INTRAVENOUS

## 2020-09-25 MED ORDER — HYDROMORPHONE HCL 1 MG/ML IJ SOLN
INTRAMUSCULAR | Status: AC
  Administered 2020-09-25: 0.5 mg via INTRAVENOUS
  Filled 2020-09-25: qty 1

## 2020-09-25 MED ORDER — LABETALOL HCL 5 MG/ML IV SOLN
INTRAVENOUS | Status: DC | PRN
  Administered 2020-09-25 (×2): 10 mg via INTRAVENOUS

## 2020-09-25 MED ORDER — ALLOPURINOL 100 MG PO TABS
100.0000 mg | ORAL_TABLET | Freq: Two times a day (BID) | ORAL | Status: DC
  Administered 2020-09-25 – 2020-09-29 (×8): 100 mg via ORAL
  Filled 2020-09-25 (×8): qty 1

## 2020-09-25 MED ORDER — ACETAMINOPHEN 325 MG PO TABS: 650.0000 mg | ORAL_TABLET | Freq: Four times a day (QID) | ORAL | Status: DC | PRN

## 2020-09-25 MED ORDER — ACETAMINOPHEN 650 MG RE SUPP: 650.0000 mg | Freq: Four times a day (QID) | RECTAL | Status: DC | PRN

## 2020-09-25 MED ORDER — LIDOCAINE 2% (20 MG/ML) 5 ML SYRINGE
INTRAMUSCULAR | Status: DC | PRN
  Administered 2020-09-25: 80 mg via INTRAVENOUS

## 2020-09-25 MED ORDER — LABETALOL HCL 5 MG/ML IV SOLN
INTRAVENOUS | Status: AC
  Filled 2020-09-25: qty 4

## 2020-09-25 MED ORDER — SODIUM CHLORIDE 0.9 % IV SOLN: INTRAVENOUS | Status: DC

## 2020-09-25 MED ORDER — LACTATED RINGERS IV SOLN: INTRAVENOUS | Status: DC

## 2020-09-25 MED ORDER — OXYCODONE HCL 5 MG PO TABS
10.0000 mg | ORAL_TABLET | ORAL | Status: DC | PRN
  Administered 2020-09-25 – 2020-09-28 (×9): 15 mg via ORAL
  Filled 2020-09-25 (×9): qty 3

## 2020-09-25 MED ORDER — ASCORBIC ACID 500 MG PO TABS
1000.0000 mg | ORAL_TABLET | Freq: Every day | ORAL | Status: DC
  Administered 2020-09-26 – 2020-09-29 (×4): 1000 mg via ORAL
  Filled 2020-09-25 (×4): qty 2

## 2020-09-25 MED ORDER — GABAPENTIN 300 MG PO CAPS
300.0000 mg | ORAL_CAPSULE | Freq: Three times a day (TID) | ORAL | Status: DC
  Administered 2020-09-25 – 2020-09-29 (×11): 300 mg via ORAL
  Filled 2020-09-25 (×12): qty 1

## 2020-09-25 MED ORDER — ONDANSETRON HCL 4 MG/2ML IJ SOLN: 4.0000 mg | Freq: Four times a day (QID) | INTRAMUSCULAR | Status: DC | PRN

## 2020-09-25 MED ORDER — METOPROLOL TARTRATE 25 MG PO TABS
12.5000 mg | ORAL_TABLET | Freq: Two times a day (BID) | ORAL | Status: DC
  Administered 2020-09-25 – 2020-09-29 (×8): 12.5 mg via ORAL
  Filled 2020-09-25 (×8): qty 1

## 2020-09-25 MED ORDER — SODIUM CHLORIDE 0.9 % IR SOLN
Status: DC | PRN
  Administered 2020-09-25: 1000 mL

## 2020-09-25 MED ORDER — SENNOSIDES-DOCUSATE SODIUM 8.6-50 MG PO TABS
1.0000 | ORAL_TABLET | Freq: Two times a day (BID) | ORAL | Status: DC
  Administered 2020-09-25 – 2020-09-29 (×8): 1 via ORAL
  Filled 2020-09-25 (×8): qty 1

## 2020-09-25 MED ORDER — INSULIN ASPART 100 UNIT/ML IJ SOLN
4.0000 [IU] | Freq: Three times a day (TID) | INTRAMUSCULAR | Status: DC
  Administered 2020-09-26 – 2020-09-29 (×9): 4 [IU] via SUBCUTANEOUS

## 2020-09-25 MED ORDER — INSULIN GLARGINE 100 UNIT/ML ~~LOC~~ SOLN
20.0000 [IU] | Freq: Two times a day (BID) | SUBCUTANEOUS | Status: DC
  Administered 2020-09-25 – 2020-09-29 (×8): 20 [IU] via SUBCUTANEOUS
  Filled 2020-09-25 (×10): qty 0.2

## 2020-09-25 MED ORDER — MORPHINE SULFATE (PF) 4 MG/ML IV SOLN
4.0000 mg | Freq: Once | INTRAVENOUS | Status: AC
Start: 2020-09-25 — End: 2020-09-25
  Administered 2020-09-25: 4 mg via INTRAMUSCULAR
  Filled 2020-09-25: qty 1

## 2020-09-25 MED ORDER — 0.9 % SODIUM CHLORIDE (POUR BTL) OPTIME
TOPICAL | Status: DC | PRN
  Administered 2020-09-25: 1000 mL

## 2020-09-25 MED ORDER — CHLORHEXIDINE GLUCONATE 0.12 % MT SOLN: 15.0000 mL | Freq: Once | OROMUCOSAL | Status: AC

## 2020-09-25 MED ORDER — MORPHINE SULFATE (PF) 4 MG/ML IV SOLN
4.0000 mg | Freq: Once | INTRAVENOUS | Status: AC
  Administered 2020-09-25: 4 mg via INTRAVENOUS
  Filled 2020-09-25: qty 1

## 2020-09-25 MED ORDER — ROCURONIUM BROMIDE 100 MG/10ML IV SOLN
INTRAVENOUS | Status: DC | PRN
  Administered 2020-09-25: 70 mg via INTRAVENOUS
  Administered 2020-09-25: 30 mg via INTRAVENOUS

## 2020-09-25 MED ORDER — FENTANYL CITRATE (PF) 250 MCG/5ML IJ SOLN
INTRAMUSCULAR | Status: AC
  Filled 2020-09-25: qty 5

## 2020-09-25 MED ORDER — FENTANYL CITRATE (PF) 250 MCG/5ML IJ SOLN
INTRAMUSCULAR | Status: DC | PRN
  Administered 2020-09-25: 50 ug via INTRAVENOUS
  Administered 2020-09-25: 100 ug via INTRAVENOUS
  Administered 2020-09-25 (×2): 50 ug via INTRAVENOUS

## 2020-09-25 MED ORDER — MIDAZOLAM HCL 2 MG/2ML IJ SOLN
INTRAMUSCULAR | Status: AC
  Filled 2020-09-25: qty 2

## 2020-09-25 MED ORDER — INSULIN ASPART 100 UNIT/ML FLEXPEN: 4.0000 [IU] | PEN_INJECTOR | Freq: Three times a day (TID) | SUBCUTANEOUS | Status: DC

## 2020-09-25 SURGICAL SUPPLY — 43 items
ADH SKN CLS APL DERMABOND .7 (GAUZE/BANDAGES/DRESSINGS) ×1
APL PRP STRL LF DISP 70% ISPRP (MISCELLANEOUS) ×1
APPLIER CLIP 5 13 M/L LIGAMAX5 (MISCELLANEOUS) ×2
APR CLP MED LRG 5 ANG JAW (MISCELLANEOUS) ×1
BAG SPEC RTRVL 10 TROC 200 (ENDOMECHANICALS) ×1
BLADE CLIPPER SURG (BLADE) ×1 IMPLANT
CANISTER SUCT 3000ML PPV (MISCELLANEOUS) ×2 IMPLANT
CHLORAPREP W/TINT 26 (MISCELLANEOUS) ×2 IMPLANT
CLIP APPLIE 5 13 M/L LIGAMAX5 (MISCELLANEOUS) ×1 IMPLANT
COVER SURGICAL LIGHT HANDLE (MISCELLANEOUS) ×2 IMPLANT
DERMABOND ADVANCED (GAUZE/BANDAGES/DRESSINGS) ×1
DERMABOND ADVANCED .7 DNX12 (GAUZE/BANDAGES/DRESSINGS) ×1 IMPLANT
DISSECTOR BLUNT TIP ENDO 5MM (MISCELLANEOUS) ×1 IMPLANT
ELECT CAUTERY BLADE 6.4 (BLADE) ×2 IMPLANT
ELECT REM PT RETURN 9FT ADLT (ELECTROSURGICAL) ×2
ELECTRODE REM PT RTRN 9FT ADLT (ELECTROSURGICAL) ×1 IMPLANT
GLOVE SURG ENC MOIS LTX SZ6.5 (GLOVE) ×2 IMPLANT
GLOVE SURG UNDER POLY LF SZ6 (GLOVE) ×2 IMPLANT
GOWN STRL REUS W/ TWL LRG LVL3 (GOWN DISPOSABLE) ×3 IMPLANT
GOWN STRL REUS W/TWL LRG LVL3 (GOWN DISPOSABLE) ×6
IV NS 1000ML (IV SOLUTION) ×2
IV NS 1000ML BAXH (IV SOLUTION) ×1 IMPLANT
KIT BASIN OR (CUSTOM PROCEDURE TRAY) ×2 IMPLANT
KIT TURNOVER KIT B (KITS) ×2 IMPLANT
NS IRRIG 1000ML POUR BTL (IV SOLUTION) ×2 IMPLANT
PAD ARMBOARD 7.5X6 YLW CONV (MISCELLANEOUS) ×2 IMPLANT
POUCH RETRIEVAL ECOSAC 10 (ENDOMECHANICALS) IMPLANT
POUCH RETRIEVAL ECOSAC 10MM (ENDOMECHANICALS) ×2
SCISSORS LAP 5X35 DISP (ENDOMECHANICALS) ×2 IMPLANT
SET IRRIG TUBING LAPAROSCOPIC (IRRIGATION / IRRIGATOR) ×1 IMPLANT
SET TUBE SMOKE EVAC HIGH FLOW (TUBING) ×2 IMPLANT
SLEEVE ENDOPATH XCEL 5M (ENDOMECHANICALS) ×3 IMPLANT
SLEEVE SCD COMPRESS KNEE XLRG (STOCKING) ×1 IMPLANT
SPONGE T-LAP 18X18 ~~LOC~~+RFID (SPONGE) ×2 IMPLANT
SUT MNCRL AB 4-0 PS2 18 (SUTURE) ×3 IMPLANT
SUT VIC AB 0 UR5 27 (SUTURE) ×4 IMPLANT
SUT VICRYL 0 AB UR-6 (SUTURE) ×1 IMPLANT
TOWEL GREEN STERILE FF (TOWEL DISPOSABLE) ×2 IMPLANT
TRAY LAPAROSCOPIC MC (CUSTOM PROCEDURE TRAY) ×2 IMPLANT
TROCAR XCEL BLADELESS 5X75MML (TROCAR) ×1 IMPLANT
TROCAR XCEL BLUNT TIP 100MML (ENDOMECHANICALS) ×2 IMPLANT
TROCAR XCEL NON-BLD 5MMX100MML (ENDOMECHANICALS) ×2 IMPLANT
WATER STERILE IRR 1000ML POUR (IV SOLUTION) ×2 IMPLANT

## 2020-09-25 NOTE — H&P (Addendum)
History and Physical    Patrick [name] [id number] DOB: November 11, 1965 DOA: 09/24/2020  Referring MD/NP/PA: Domenic Moras, PA-C PCP: [facility], [facility]  Consultants: Dr. Sharol Given orthopedics Patient coming from: Home  Chief Complaint: Abdominal pain  I have personally briefly reviewed patient's old medical records in [place]   HPI: Patrick [name] is a 55 y.o. male with medical history significant of hypertension, diabetes mellitus type 2, gout, chronic kidney disease stage III, peripheral vascular disease s/p right BKA, and left diabetic heel ulcer who presented with complaints of abdominal pain for approximately 1 week.  Complains of a sharp and crampy abdominal pain that is epigastric and to the right upper quadrant of his abdomen.  Reports pain radiates to his back and lower right quadrant of his abdomen.  Reported associated symptoms of nausea, mild chills, shortness of breath related with pain, and constipation(last bowel movement 4 days ago). Symptoms worsened with any kind of movements and after eating.  He had been taking oxycodone for pain at home after his last discharge.  Patient has been dealing with right knee pain and swelling as well for which he thinks he is having a gout flare, but had ran out of his indomethacin.  Patient had just recently been hospitalized from 7/30-8/1 for diabetic foot infection of the right heel with cellulitis.  He was discharged home on a 7-day course of amoxicillin and doxycycline which patient reports that he was still taking and a prescription for Darco shoes.  He does not recall picking up the Darco shoes prescribed.  He is followed by Dr. Sharol Given of orthopedics for the right heel ulcer, but he missed his last follow-up appointment due to issues with transportation.  ED Course: Upon admission into the emergency department patient was seen to be afebrile, pulse 76 -101, blood pressure is elevated up to 182/109, and all other vital signs maintained.  Labs  significant for WBC 12, hemoglobin 8.3, platelets 506, BUN 13, creatinine 1.28, glucose 196, and albumin 2.5. Urinalysis did not show any concern for infection. COVID-19 and influenza screening was negative.  Abdominal ultrasound was significant for cholelithiasis with gallbladder sludge and mild gallbladder wall thickening with concern for acute cholecystitis.  X-ray of the left foot revealed chronic degenerative changes with irregular areas of sclerosis and lucency in the calcaneus for which MRI with and without kalemia was recommended for better evaluation for concern for osteomyelitis.  General surgery was consulted, but recommended hospitalist admission.  Patient had been given Rocephin 1 g IV and morphine for pain.  Chest   Review of Systems  Constitutional:  Positive for chills.  HENT:  Negative for congestion and nosebleeds.   Eyes:  Negative for pain.  Respiratory:  Positive for shortness of breath. Negative for cough.   Cardiovascular:  Positive for leg swelling (Chronic). Negative for chest pain.  Gastrointestinal:  Positive for abdominal pain, constipation and nausea. Negative for vomiting.  Genitourinary:  Negative for dysuria.  Musculoskeletal:  Positive for joint pain.  Skin:        Right heel ulcer  Neurological:  Negative for loss of consciousness.  Psychiatric/Behavioral:  Negative for memory loss and substance abuse.    Past Medical History:  Diagnosis Date   Diabetes mellitus    Gout    History of COVID-19 04/2019   Hypertension    PVD (peripheral vascular disease) ([place])    s/p R BKA    Past Surgical History:  Procedure Laterality Date   AMPUTATION Right  06/06/2020   Procedure: AMPUTATION 5TH RAY;  Surgeon: [name], MD;  Location: [place];  Service: Orthopedics;  Laterality: Right;   AMPUTATION Right 06/08/2020   Procedure: RIGHT 4TH RAY AMPUTATION;  Surgeon: [name], MD;  Location: [place];  Service: Orthopedics;  Laterality: Right;   AMPUTATION Right  08/27/2020   Procedure: AMPUTATION BELOW KNEE;  Surgeon: Jessy Oto, MD;  Location: [place];  Service: Orthopedics;  Laterality: Right;     reports that he has never smoked. He has never used smokeless tobacco. He reports that he does not drink alcohol and does not use drugs.  No Known Allergies  Family History  Problem Relation Age of Onset   Diabetes Neg Hx     Prior to Admission medications   Medication Sig Start Date End Date Taking? Authorizing Provider  acetaminophen (TYLENOL) 500 MG tablet Take 1,000 mg by mouth every 6 (six) hours as needed for mild pain or headache.    [provider]  allopurinol (ZYLOPRIM) 100 MG tablet Take 1 tablet (100 mg total) by mouth 2 (two) times daily. 09/08/20   Angiulli, Lavon Paganini, PA-C  amoxicillin-clavulanate (AUGMENTIN) 875-125 MG tablet Take 1 tablet by mouth 2 (two) times daily for 7 days. 09/19/20 09/26/20  Pokhrel, Corrie Mckusick, MD  ascorbic acid (VITAMIN C) 1000 MG tablet Take 1 tablet (1,000 mg total) by mouth daily. 09/08/20   Angiulli, Lavon Paganini, PA-C  docusate sodium (COLACE) 100 MG capsule Take 1 capsule (100 mg total) by mouth daily. 09/02/20   Ezekiel Slocumb, DO  doxycycline (VIBRA-TABS) 100 MG tablet Take 1 tablet (100 mg total) by mouth 2 (two) times daily for 7 days. 09/19/20 09/26/20  Pokhrel, Corrie Mckusick, MD  ferrous sulfate 325 (65 FE) MG tablet Take 1 tablet (325 mg total) by mouth 3 (three) times daily after meals. 09/08/20   Angiulli, Lavon Paganini, PA-C  gabapentin (NEURONTIN) 300 MG capsule Take 1 capsule (300 mg total) by mouth 3 (three) times daily. 09/08/20   Angiulli, Lavon Paganini, PA-C  insulin aspart (NOVOLOG FLEXPEN) 100 UNIT/ML FlexPen Inject 4 Units into the skin 3 (three) times daily with meals. 09/08/20   Angiulli, Lavon Paganini, PA-C  insulin glargine (LANTUS SOLOSTAR) 100 UNIT/ML Solostar Pen Inject 25 Units into the skin 2 (two) times daily. 09/08/20   Angiulli, Lavon Paganini, PA-C  Insulin Pen Needle 32G X 4 MM MISC Use as directed 09/08/20    [name], Clide Deutscher, MD  Insulin Pen Needle 32G X 4 MM MISC Use as directed 09/09/20   Angiulli, Lavon Paganini, PA-C  losartan (COZAAR) 100 MG tablet Take 100 mg by mouth daily.    [provider]  losartan-hydrochlorothiazide (HYZAAR) 100-25 MG tablet Take 1 tablet by mouth daily.    [provider]  metoprolol tartrate (LOPRESSOR) 25 MG tablet Take 0.5 tablets (12.5 mg total) by mouth 2 (two) times daily. 09/08/20   Angiulli, Lavon Paganini, PA-C  Multiple Vitamin (MULTIVITAMIN WITH MINERALS) TABS tablet Take 1 tablet by mouth daily. 09/02/20   Ezekiel Slocumb, DO  Oxycodone HCl 10 MG TABS Take 1-1.5 tablets (10-15 mg total) by mouth every 4 (four) hours as needed (pain score 7-10). 09/08/20   Angiulli, Lavon Paganini, PA-C  tiZANidine (ZANAFLEX) 4 MG capsule Take 4 mg by mouth daily as needed for muscle spasms.    [provider]  Zinc Sulfate 220 (50 Zn) MG TABS Take 1 tablet (220 mg total) by mouth daily. 09/08/20   Lauraine Rinne  J, PA-C    Physical Exam:  Constitutional: Obese middle-age male currently resting in no acute distress Vitals:   09/25/20 0317 09/25/20 0459 09/25/20 0614 09/25/20 0841  BP: (!) 172/106 (!) 182/109 (!) 170/107 (!) 177/95  Pulse: 98 (!) 101 98 97  Resp: '16 15 15 14  '$ Temp:  98.7 F (37.1 C) 98.6 F (37 C)   TempSrc:  Oral Oral   SpO2: 100% 100% 100% 100%  Weight:      Height:       Eyes: PERRL, lids and conjunctivae normal ENMT: Mucous membranes are moist. Posterior pharynx clear of any exudate or lesions.  Neck: normal, supple, no masses, no thyromegaly Respiratory: clear to auscultation bilaterally, no wheezing, no crackles. Normal respiratory effort. No accessory muscle use.  Cardiovascular: Regular rate and rhythm, no murmurs / rubs / gallops.  +1 lower extremity edema of the right leg. 2+ pedal pulses. No carotid bruits.  Abdomen: Tenderness to palpation over the right upper quadrant of the abdomen. Musculoskeletal: no clubbing / cyanosis.   Right BKA. Skin: Venous stasis changes noted that the left leg. left heel ulcer eschar present with mild bleeding, but no significant erythema appreciated.  As seen below.   Neurologic: CN 2-12 grossly intact. Sensation intact, DTR normal. Strength 5/5 in all 4.  Psychiatric: Normal judgment and insight. Alert and oriented x 3. Normal mood.     Labs on Admission: I have personally reviewed following labs and imaging studies  CBC: Recent Labs  Lab 09/19/20 0138 09/22/20 0524 09/24/20 2351  WBC 11.1* 12.9* 12.0*  NEUTROABS  --  9.4* 7.8*  HGB 7.1* 7.2* 8.3*  HCT 22.9* 24.4* 27.3*  MCV 81.5 83.3 83.2  PLT 316 282 [id number]*   Basic Metabolic Panel: Recent Labs  Lab 09/19/20 0138 09/22/20 0524 09/24/20 2351  NA 135 139 138  K 3.5 3.6 3.8  CL 105 105 105  CO2 '22 23 26  '$ GLUCOSE 160* 142* 196*  BUN '11 19 13  '$ CREATININE 1.38* 1.40* 1.28*  CALCIUM 8.0* 8.8* 8.9   GFR: Estimated Creatinine Clearance: 100.5 mL/min (A) (by C-G formula based on SCr of 1.28 mg/dL (H)). Liver Function Tests: Recent Labs  Lab 09/24/20 2351  AST 15  ALT 13  ALKPHOS 114  BILITOT 0.7  PROT 7.1  ALBUMIN 2.5*   Recent Labs  Lab 09/24/20 2351  LIPASE 34   No results for input(s): AMMONIA in the last 168 hours. Coagulation Profile: No results for input(s): INR, PROTIME in the last 168 hours. Cardiac Enzymes: No results for input(s): CKTOTAL, CKMB, CKMBINDEX, TROPONINI in the last 168 hours. BNP (last 3 results) No results for input(s): PROBNP in the last 8760 hours. HbA1C: No results for input(s): HGBA1C in the last 72 hours. CBG: Recent Labs  Lab 09/19/20 0439 09/19/20 0830 09/19/20 1132 09/19/20 1623 09/22/20 0123  GLUCAP 123* 107* 101* 115* 142*   Lipid Profile: No results for input(s): CHOL, HDL, LDLCALC, TRIG, CHOLHDL, LDLDIRECT in the last 72 hours. Thyroid Function Tests: No results for input(s): TSH, T4TOTAL, FREET4, T3FREE, THYROIDAB in the last 72 hours. Anemia Panel: No  results for input(s): VITAMINB12, FOLATE, FERRITIN, TIBC, IRON, RETICCTPCT in the last 72 hours. Urine analysis:    Component Value Date/Time   COLORURINE YELLOW 09/24/2020 2355   APPEARANCEUR CLEAR 09/24/2020 2355   LABSPEC 1.017 09/24/2020 2355   PHURINE 5.0 09/24/2020 2355   GLUCOSEU NEGATIVE 09/24/2020 2355   HGBUR MODERATE (A) 09/24/2020 2355   HGBUR negative 01/27/2010  [place] 09/24/2020 2355   [place] 09/24/2020 2355   PROTEINUR 100 (A) 09/24/2020 2355   UROBILINOGEN 0.2 10/26/2013 1130   NITRITE NEGATIVE 09/24/2020 2355   LEUKOCYTESUR NEGATIVE 09/24/2020 2355   Sepsis Labs: Recent Results (from the past 240 hour(s))  Blood culture (routine x 2)     Status: None   Collection Time: 09/17/20  1:00 AM   Specimen: BLOOD  Result Value Ref Range Status   Specimen Description BLOOD RIGHT ARM  Final   Special Requests   Final    BOTTLES DRAWN AEROBIC AND ANAEROBIC Blood Culture adequate volume   Culture   Final    NO GROWTH 5 DAYS Performed at [facility], [place] [address], [place], [place] [address]    Report Status 09/22/2020 FINAL  Final  Blood culture (routine x 2)     Status: None   Collection Time: 09/17/20  1:16 AM   Specimen: BLOOD  Result Value Ref Range Status   Specimen Description BLOOD LEFT ARM  Final   Special Requests   Final    BOTTLES DRAWN AEROBIC ONLY Blood Culture adequate volume   Culture   Final    NO GROWTH 5 DAYS Performed at [facility], [place] [address], [place], [place] [address]    Report Status 09/22/2020 FINAL  Final  Resp Panel by RT-PCR (Flu A&B, Covid) Nasopharyngeal Swab     Status: None   Collection Time: 09/17/20  1:20 AM   Specimen: Nasopharyngeal Swab; Nasopharyngeal(NP) swabs in vial transport medium  Result Value Ref Range Status   SARS Coronavirus 2 by RT PCR NEGATIVE NEGATIVE Final    Comment: (NOTE) SARS-CoV-2 target nucleic acids are NOT DETECTED.  The SARS-CoV-2 RNA is generally  detectable in upper respiratory specimens during the acute phase of infection. The lowest concentration of SARS-CoV-2 viral copies this assay can detect is 138 copies/mL. A negative result does not preclude SARS-Cov-2 infection and should not be used as the sole basis for treatment or other patient management decisions. A negative result may occur with  improper specimen collection/handling, submission of specimen other than nasopharyngeal swab, presence of viral mutation(s) within the areas targeted by this assay, and inadequate number of viral copies(<138 copies/mL). A negative result must be combined with clinical observations, patient history, and epidemiological information. The expected result is Negative.  Fact Sheet for Patients:  EntrepreneurPulse.com.au  Fact Sheet for Healthcare Providers:  IncredibleEmployment.be  This test is no t yet approved or cleared by the [place] FDA and  has been authorized for detection and/or diagnosis of SARS-CoV-2 by FDA under an Emergency Use Authorization (EUA). This EUA will remain  in effect (meaning this test can be used) for the duration of the COVID-19 declaration under Section 564(b)(1) of the Act, 21 U.S.C.section 360bbb-3(b)(1), unless the authorization is terminated  or revoked sooner.       Influenza A by PCR NEGATIVE NEGATIVE Final   Influenza B by PCR NEGATIVE NEGATIVE Final    Comment: (NOTE) The Xpert Xpress SARS-CoV-2/FLU/RSV plus assay is intended as an aid in the diagnosis of influenza from Nasopharyngeal swab specimens and should not be used as a sole basis for treatment. Nasal washings and aspirates are unacceptable for Xpert Xpress SARS-CoV-2/FLU/RSV testing.  Fact Sheet for Patients: EntrepreneurPulse.com.au  Fact Sheet for Healthcare Providers: IncredibleEmployment.be  This test is not yet approved or cleared by the [place] FDA  and has been authorized for detection and/or diagnosis  of SARS-CoV-2 by FDA under an Emergency Use Authorization (EUA). This EUA will remain in effect (meaning this test can be used) for the duration of the COVID-19 declaration under Section 564(b)(1) of the Act, 21 U.S.C. section 360bbb-3(b)(1), unless the authorization is terminated or revoked.  Performed at [facility], [place] [address], [place], [place] [address]      Radiological Exams on Admission: US Abdomen Limited  Result Date: 09/25/2020 CLINICAL DATA:  Right upper quadrant pain EXAM: ULTRASOUND ABDOMEN LIMITED RIGHT UPPER QUADRANT COMPARISON:  None. FINDINGS: Gallbladder: Multiple stones within the gallbladder measuring up to 1.7 cm. There is sludge. Mild wall thickening at 3.6 mm. The patient was tender over the gallbladder during the study. Common bile duct: Diameter: Normal caliber, 3 mm Liver: No focal lesion identified. Within normal limits in parenchymal echogenicity. Portal vein is patent on color Doppler imaging with normal direction of blood flow towards the liver. Other: None. IMPRESSION: Stones and sludge within the gallbladder. Mild gallbladder wall thickening and tenderness over the gallbladder. Findings concerning for acute cholecystitis. Electronically Signed   By: Rolm Baptise M.D.   On: 09/25/2020 00:21   DG Foot 2 Views Left  Result Date: 09/25/2020 CLINICAL DATA:  55 year old male with history of swelling in the left foot and ulcer overlying the left heel. Evaluate for osteomyelitis. EXAM: LEFT FOOT - 2 VIEW COMPARISON:  Left foot radiograph 09/17/2020. FINDINGS: Two views of the left foot demonstrate irregular areas of sclerosis and lucency in the calcaneus. Small plantar calcaneal spur. No acute displaced fracture or dislocation. Multifocal joint space narrowing, subchondral sclerosis, subchondral cyst formation and osteophyte formation is noted in the midfoot and at the first MTP joint, indicative of  osteoarthritis. Pes planus. IMPRESSION: 1. In addition to chronic degenerative changes in the foot, there are irregular areas of sclerosis and lucency in the calcaneus. Given the patient's history of heel ulcer, the possibility of chronic calcaneal osteomyelitis warrants consideration and further evaluation with left foot MRI with and without IV gadolinium is recommended to better evaluate this finding. Electronically Signed   By: Vinnie Langton M.D.   On: 09/25/2020 08:49    EKG: Independently reviewed.   Assessment/Plan Cholelithiasis with suspected acute cholecystitis: Patient presents with  complaints of right upper quadrant abdominal pain over the last week.   Renal CT noted cholelithiasis with mildly distended gallbladder on 8/4.  Patient had ultrasound today whichgave concern for cholecystitis.  Liver enzymes and total bilirubin within normal limits and no noted of concern for biliary obstruction on imaging.  General surgery was consulted and plan to take the patient for surgery -Admit to medical telemetry bed -N.p.o. except for meds -Continue empiric antibiotics of Rocephin and metronidazole -IV pain medication as needed -General surgery consulted and plan to take patient for laparoscopic  Diabetic foot ulcer: Patient just recently hospitalized from 7/30-8/1 discharged home on Augmentin and doxycycline to complete a 7-day course.  Patient was taking antibiotics as prescribed, did not report obtaining Darco shoes that were prescribed.he had also recently missed his follow-up appointment with Dr. Sharol Given.  X-rays were concerning for irregular areas of sclerosis which it was reported concern for calcaneus osteomyelitis.  However, patient just recently had a MRI performed on 7/31 of left heel that gave center for cellulitis but no signs of abscess, myositis, osteomyelitis, or septic joint. -Held Augmentin and doxycycline -Continue oxycodone as needed for pain after procedure -Wound care  consulted -May want to discuss with Dr. Sharol Given in a.m.  Hypertensive urgency: Acute.  On admission blood pressure elevated up to 182/109.  Home blood pressure medications include metoprolol 12.5 mg twice daily and losartan 100 mg daily -Restart home blood pressure regimen -Hydralazine IV as needed for elevated blood pressure  Constipation: Acute.  Patient reports last bowel movement was 4 days ago.  He had not been taking any stool softeners while on oxycodone. -Monitor intake and output -Senokot-S twice daily  Diabetes mellitus type 2, uncontrolled: Last hemoglobin A1c 7.8 on 08/27/2020.  Home regimen includes 4 units 3 times daily with meals and Lantus 25 units twice daily. -Hypoglycemic protocols -Continue gabapentin -Continue NovoLog 4 units 3 times daily with meals -Continue to reduce Lantus 20 units twice daily after procedure -CBGs before every meal with sensitive SSI  Gout flare: Patient reports having significant pain pain and swelling to right knee which we suspected that he had a acute gout flare. -Continue current pain regimen -May consider discussing with orthopedics in regards to possible  steroid injection  Hypochromic anemia: Hemoglobin 8.3 which appears around patient's baseline. -Continue to monitor  CKD stage II: Creatinine 1.28 which appears around patient's baseline. -Continue to monitor  Thrombocytosis: Acute.  Platelet count elevated at 506.  Suspect reactive in nature. -Continue to monitor  S/p BKA  Obesity: BMI 36.52 kg/m  DVT prophylaxis: Lovenox Code Status: Full Family Communication: Patient declined need to update family Disposition Plan: Hopefully home once medically stable Consults called: General surgery Admission status: Inpatient, require more than 2 midnight stay  Norval Morton MD [facility]   If 7PM-7AM, please contact night-coverage   09/25/2020, 8:58 AM

## 2020-09-25 NOTE — ED Provider Notes (Signed)
[facility] EMERGENCY DEPARTMENT Provider Note   CSN: [id number] Arrival date & time: 09/24/20  2306     History Chief Complaint  Patient presents with   Abdominal Pain   Leg Pain    Patrick [name] is a 55 y.o. male.  The history is provided by the patient and medical records. No language interpreter was used.  Abdominal Pain Leg Pain  55 year old male with history of diabetes, hypertension, PVD who presents for evaluation of abdominal pain.  Patient report for the past 5 days he has had recurrent pain to his right upper abdomen.  Pain is described as a sharp crampy sensation radiates towards his back, worse with movement and improves when he lays still.  Pain has become increasingly more intense with occasional bouts of nausea when the pain is severe.  He report decrease in bowel movement.  He denies having fever or chills no postprandial pain no chest pain or shortness of breath.  No specific treatment tried.  Furthermore, he also endorsed having concerns of a wound to his left heel.  States that he has a right BKA by Dr. Sharol Given in the past.  He has a blister that formed in the back of his left heel that has since ruptured and dry for more than a week.  He denies having worsening pain or numbness but would like to have the wound looked at because he does not want to lose his left foot as well.  He has been vaccinated for COVID-19.  Past Medical History:  Diagnosis Date   Diabetes mellitus    Gout    History of COVID-19 04/2019   Hypertension    PVD (peripheral vascular disease) ([place])    s/p R BKA    Patient Active Problem List   Diagnosis Date Noted   S/P BKA (below knee amputation) unilateral, right ([place]) 09/01/2020   ABLA (acute blood loss anemia) 09/01/2020   Anemia 08/27/2020   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 ([place]) 07/20/2020   Dehiscence of amputation stump ([place]) 07/20/2020   Cutaneous abscess of right foot    Osteomyelitis of  right foot (H[facility])    Severe protein-calorie malnutrition ([place])    Sepsis due to cellulitis ([place]) 06/04/2020   Diabetic foot ulcer ([place]) 06/04/2020   Diabetic foot infection ([place]) 06/04/2020   CKD (chronic kidney disease) stage 3, GFR 30-59 ml/min ([place]) 06/04/2020   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[place]) 06/04/2020   Acute respiratory disease due to COVID-19 virus 05/03/2019   Renal insufficiency 05/03/2019   GOUT 01/27/2010   OBESITY 01/27/2010   Insulin-requiring or dependent type II diabetes mellitus ([place]) 02/20/2008   HYPERTENSION, BENIGN ESSENTIAL 02/20/2008    Past Surgical History:  Procedure Laterality Date   AMPUTATION Right 06/06/2020   Procedure: AMPUTATION 5TH RAY;  Surgeon: [name], MD;  Location: [facility];  Service: Orthopedics;  Laterality: Right;   AMPUTATION Right 06/08/2020   Procedure: RIGHT 4TH RAY AMPUTATION;  Surgeon: [name], MD;  Location: [facility];  Service: Orthopedics;  Laterality: Right;   AMPUTATION Right 08/27/2020   Procedure: AMPUTATION BELOW KNEE;  Surgeon: Jessy Oto, MD;  Location: [facility];  Service: Orthopedics;  Laterality: Right;       Family History  Problem Relation Age of Onset   Diabetes Neg Hx     Social History   Tobacco Use   Smoking status: Never   Smokeless tobacco: Never  Vaping Use  Vaping Use: Never used  Substance Use Topics   Alcohol use: No   Drug use: No    Home Medications Prior to Admission medications   Medication Sig Start Date End Date Taking? Authorizing Provider  acetaminophen (TYLENOL) 500 MG tablet Take 1,000 mg by mouth every 6 (six) hours as needed for mild pain or headache.    [provider]  allopurinol (ZYLOPRIM) 100 MG tablet Take 1 tablet (100 mg total) by mouth 2 (two) times daily. 09/08/20   Angiulli, Lavon Paganini, PA-C  amoxicillin-clavulanate (AUGMENTIN) 875-125 MG tablet Take 1 tablet by mouth 2 (two) times daily for 7 days. 09/19/20 09/26/20  Pokhrel, Corrie Mckusick, MD  ascorbic acid (VITAMIN C) 1000 MG  tablet Take 1 tablet (1,000 mg total) by mouth daily. 09/08/20   Angiulli, Lavon Paganini, PA-C  docusate sodium (COLACE) 100 MG capsule Take 1 capsule (100 mg total) by mouth daily. 09/02/20   Ezekiel Slocumb, DO  doxycycline (VIBRA-TABS) 100 MG tablet Take 1 tablet (100 mg total) by mouth 2 (two) times daily for 7 days. 09/19/20 09/26/20  Pokhrel, Corrie Mckusick, MD  ferrous sulfate 325 (65 FE) MG tablet Take 1 tablet (325 mg total) by mouth 3 (three) times daily after meals. 09/08/20   Angiulli, Lavon Paganini, PA-C  gabapentin (NEURONTIN) 300 MG capsule Take 1 capsule (300 mg total) by mouth 3 (three) times daily. 09/08/20   Angiulli, Lavon Paganini, PA-C  insulin aspart (NOVOLOG FLEXPEN) 100 UNIT/ML FlexPen Inject 4 Units into the skin 3 (three) times daily with meals. 09/08/20   Angiulli, Lavon Paganini, PA-C  insulin glargine (LANTUS SOLOSTAR) 100 UNIT/ML Solostar Pen Inject 25 Units into the skin 2 (two) times daily. 09/08/20   Angiulli, Lavon Paganini, PA-C  Insulin Pen Needle 32G X 4 MM MISC Use as directed 09/08/20   [name], Clide Deutscher, MD  Insulin Pen Needle 32G X 4 MM MISC Use as directed 09/09/20   Angiulli, Lavon Paganini, PA-C  losartan (COZAAR) 100 MG tablet Take 100 mg by mouth daily.    [provider]  losartan-hydrochlorothiazide (HYZAAR) 100-25 MG tablet Take 1 tablet by mouth daily.    [provider]  metoprolol tartrate (LOPRESSOR) 25 MG tablet Take 0.5 tablets (12.5 mg total) by mouth 2 (two) times daily. 09/08/20   Angiulli, Lavon Paganini, PA-C  Multiple Vitamin (MULTIVITAMIN WITH MINERALS) TABS tablet Take 1 tablet by mouth daily. 09/02/20   Ezekiel Slocumb, DO  Oxycodone HCl 10 MG TABS Take 1-1.5 tablets (10-15 mg total) by mouth every 4 (four) hours as needed (pain score 7-10). 09/08/20   Angiulli, Lavon Paganini, PA-C  tiZANidine (ZANAFLEX) 4 MG capsule Take 4 mg by mouth daily as needed for muscle spasms.    [provider]  Zinc Sulfate 220 (50 Zn) MG TABS Take 1 tablet (220 mg total) by mouth daily.  09/08/20   Angiulli, Lavon Paganini, PA-C    Allergies    Patient has no known allergies.  Review of Systems   Review of Systems  Gastrointestinal:  Positive for abdominal pain.  All other systems reviewed and are negative.  Physical Exam Updated Vital Signs BP (!) 170/107 (BP Location: Left Arm)   Pulse 98   Temp 98.6 F (37 C) (Oral)   Resp 15   Ht '6\' 4"'$  (1.93 m)   Wt 136.1 kg   SpO2 100%   BMI 36.52 kg/m   Physical Exam Vitals and nursing note reviewed.  Constitutional:      General: He  is not in acute distress.    Appearance: He is well-developed.  HENT:     Head: Atraumatic.  Eyes:     Conjunctiva/sclera: Conjunctivae normal.  Cardiovascular:     Rate and Rhythm: Tachycardia present.     Heart sounds: Normal heart sounds.  Pulmonary:     Effort: Pulmonary effort is normal.     Breath sounds: Normal breath sounds. No wheezing, rhonchi or rales.  Abdominal:     General: Abdomen is flat. Bowel sounds are normal.     Palpations: Abdomen is soft.     Tenderness: There is abdominal tenderness in the right upper quadrant. There is guarding. There is no rebound. Positive signs include Murphy's sign. Negative signs include McBurney's sign.  Musculoskeletal:     Cervical back: Neck supple.     Comments: Left foot: Dry ulceration noted to the heel with granular tissue present.  No significant tenderness to palpation no surrounding skin erythema.  Skin:    Findings: No rash.  Neurological:     Mental Status: He is alert.    ED Results / Procedures / Treatments   Labs (all labs ordered are listed, but only abnormal results are displayed) Labs Reviewed  CBC WITH DIFFERENTIAL/PLATELET - Abnormal; Notable for the following components:      Result Value   WBC 12.0 (*)    RBC 3.28 (*)    Hemoglobin 8.3 (*)    HCT 27.3 (*)    MCH 25.3 (*)    RDW 17.7 (*)    Platelets 506 (*)    Neutro Abs 7.8 (*)    Abs Immature Granulocytes 0.13 (*)    All other components within normal  limits  COMPREHENSIVE METABOLIC PANEL - Abnormal; Notable for the following components:   Glucose, Bld 196 (*)    Creatinine, Ser 1.28 (*)    Albumin 2.5 (*)    All other components within normal limits  URINALYSIS, ROUTINE W REFLEX MICROSCOPIC - Abnormal; Notable for the following components:   Hgb urine dipstick MODERATE (*)    Protein, ur 100 (*)    All other components within normal limits  RESP PANEL BY RT-PCR (FLU A&B, COVID) ARPGX2  LIPASE, BLOOD    EKG None  Radiology US Abdomen Limited  Result Date: 09/25/2020 CLINICAL DATA:  Right upper quadrant pain EXAM: ULTRASOUND ABDOMEN LIMITED RIGHT UPPER QUADRANT COMPARISON:  None. FINDINGS: Gallbladder: Multiple stones within the gallbladder measuring up to 1.7 cm. There is sludge. Mild wall thickening at 3.6 mm. The patient was tender over the gallbladder during the study. Common bile duct: Diameter: Normal caliber, 3 mm Liver: No focal lesion identified. Within normal limits in parenchymal echogenicity. Portal vein is patent on color Doppler imaging with normal direction of blood flow towards the liver. Other: None. IMPRESSION: Stones and sludge within the gallbladder. Mild gallbladder wall thickening and tenderness over the gallbladder. Findings concerning for acute cholecystitis. Electronically Signed   By: Rolm Baptise M.D.   On: 09/25/2020 00:21    Procedures Procedures   Medications Ordered in ED Medications  cefTRIAXone (ROCEPHIN) 1 g in sodium chloride 0.9 % 100 mL IVPB (1 g Intravenous New Bag/Given 09/25/20 0832)  morphine 4 MG/ML injection 4 mg (4 mg Intramuscular Given 09/25/20 0452)  morphine 4 MG/ML injection 4 mg (4 mg Intravenous Given 09/25/20 0830)    ED Course  I have reviewed the triage vital signs and the nursing notes.  Pertinent labs & imaging results that were available during my  care of the patient were reviewed by me and considered in my medical decision making (see chart for details).    MDM  Rules/Calculators/A&P                           BP (!) 177/95 (BP Location: Left Wrist)   Pulse 97   Temp 98.6 F (37 C) (Oral)   Resp 14   Ht '6\' 4"'$  (1.93 m)   Wt 136.1 kg   SpO2 100%   BMI 36.52 kg/m   Final Clinical Impression(s) / ED Diagnoses Final diagnoses:  Acute cholecystitis    Rx / DC Orders ED Discharge Orders     None      7:35 AM Patient here with recurrent right upper quadrant abdominal pain for nearly a week.  Work-up today remarkable for elevated white count of 12.0 and limited ultrasound that demonstrated findings concerning for acute cholecystitis.  Will consult general surgery for further care.  Patient also would like for me to evaluate his left heel blisters that has ruptured more than a week ago but he would like to make sure that that there is no worsening infection.  He does have granular tissue noted at the heel without obvious signs of infection.  X-ray ordered just to assess for potential osteomyelitis.  However I encourage patient to follow-up with his orthopedist and his PCP for outpatient management of this site.  A primary focus will be to address his gallbladder issue today.  Patient voiced understanding and agrees with plan.  8:10 AM Appreciate consultation from general surgery PA Claiborne Billings who request medicine admission and they will be involved in patient care.  Also recommend initiate Rocephin antibiotic.  Will consult medicine for admission.  8:51 AM Appreciate consultation from [facility] Dr. Tamala Julian who agrees to see and will admit pt.    Domenic Moras, PA-C 09/25/20 [id number]    Dorie Rank, MD 09/26/20 [phone]

## 2020-09-25 NOTE — Anesthesia Postprocedure Evaluation (Signed)
Anesthesia Post Note  Patient: Patrick [name]  Procedure(s) Performed: LAPAROSCOPIC CHOLECYSTECTOMY (Abdomen)     Patient location during evaluation: PACU Anesthesia Type: General Level of consciousness: awake and alert Pain management: pain level controlled Vital Signs Assessment: post-procedure vital signs reviewed and stable Respiratory status: spontaneous breathing, nonlabored ventilation and respiratory function stable Cardiovascular status: blood pressure returned to baseline and stable Postop Assessment: no apparent nausea or vomiting Anesthetic complications: no   No notable events documented.  Last Vitals:  Vitals:   09/25/20 1645 09/25/20 1700  BP: (!) 151/92 (!) 150/96  Pulse: (!) 104 (!) 105  Resp: 19 17  Temp:    SpO2: 94% 94%    Last Pain:  Vitals:   09/25/20 1700  TempSrc:   PainSc: Asleep                 Phoenix Riesen,W. EDMOND

## 2020-09-25 NOTE — Anesthesia Procedure Notes (Signed)
Procedure Name: Intubation Date/Time: 09/25/2020 1:44 PM Performed by: Trinna Post., CRNA Pre-anesthesia Checklist: Patient identified, Emergency Drugs available, Suction available, Patient being monitored and Timeout performed Patient Re-evaluated:Patient Re-evaluated prior to induction Oxygen Delivery Method: Circle system utilized Preoxygenation: Pre-oxygenation with 100% oxygen Induction Type: IV induction, Rapid sequence and Cricoid Pressure applied Laryngoscope Size: Mac and 4 Grade View: Grade II Tube type: Oral Tube size: 7.5 mm Number of attempts: 1 Airway Equipment and Method: Stylet Placement Confirmation: ETT inserted through vocal cords under direct vision, positive ETCO2 and breath sounds checked- equal and bilateral Secured at: 23 cm Tube secured with: Tape Dental Injury: Teeth and Oropharynx as per pre-operative assessment

## 2020-09-25 NOTE — Progress Notes (Signed)
Patient underwent cholecystectomy.  General surgery recommending holding Toradol and Lovenox at least till 8/8.

## 2020-09-25 NOTE — Transfer of Care (Addendum)
Immediate Anesthesia Transfer of Care Note  Patient: Patrick [name]  Procedure(s) Performed: LAPAROSCOPIC CHOLECYSTECTOMY (Abdomen)  Patient Location: PACU  Anesthesia Type:General  Level of Consciousness: drowsy and patient cooperative  Airway & Oxygen Therapy: Patient Spontanous Breathing and Patient connected to face mask oxygen  Post-op Assessment: Report given to RN and Post -op Vital signs reviewed and stable  Post vital signs: Reviewed and stable  Last Vitals:  Vitals Value Taken Time  BP 154/87 09/25/20 1613  Temp 36.7 C 09/25/20 1604  Pulse 78 09/25/20 1614  Resp 20 09/25/20 1614  SpO2 93 % 09/25/20 1614  Vitals shown include unvalidated device data.  Last Pain:  Vitals:   09/25/20 1604  TempSrc:   PainSc: 0-No pain         Complications: No notable events documented.

## 2020-09-25 NOTE — Consult Note (Addendum)
Reason for Consult/Chief Complaint: acute cholecystitis Consultant: Marlon Pel, [place]  Patrick [name] is an 55 y.o. male.   HPI: 56M with a one week history of RUQ abdominal pain, at times severe enough to induce nausea. Pain radiates into back and RLQ. Reports onset of pain was during his last hospitalization which was for a diabetic L foot infection and was given oxycodone for this, which he reports did help. Reports last HgbA1c was 11, takes insulin for his DM. S/p R BKA 35mago. No prior abdominal surgery. Reports a normal colonoscopy at 55yo  Past Medical History:  Diagnosis Date   Diabetes mellitus    Gout    History of COVID-19 04/2019   Hypertension    PVD (peripheral vascular disease) ([place]    s/p R BKA    Past Surgical History:  Procedure Laterality Date   AMPUTATION Right 06/06/2020   Procedure: AMPUTATION 5TH RAY;  Surgeon: DNewt Minion MD;  [place] [facility]  Service: Orthopedics;  Laterality: Right;   AMPUTATION Right 06/08/2020   Procedure: RIGHT 4TH RAY AMPUTATION;  Surgeon: DNewt Minion MD;  [place] [facility]  Service: Orthopedics;  Laterality: Right;   AMPUTATION Right 08/27/2020   Procedure: AMPUTATION BELOW KNEE;  Surgeon: NJessy Oto MD;  [place] [facility]  Service: Orthopedics;  Laterality: Right;    Family History  Problem Relation Age of Onset   Diabetes Neg Hx     Social History:  reports that he has never smoked. He has never used smokeless tobacco. He reports that he does not drink alcohol and does not use drugs.  Allergies: No Known Allergies  Medications: I have reviewed the patient's current medications.  Results for orders placed or performed during the hospital encounter of 09/24/20 (from the past 48 hour(s))  CBC with Differential     Status: Abnormal   Collection Time: 09/24/20 11:51 PM  Result Value Ref Range   WBC 12.0 (H) 4.0 - 10.5 K/uL   RBC 3.28 (L) 4.22 - 5.81 MIL/uL   Hemoglobin 8.3 (L) 13.0 - 17.0 g/dL   HCT 27.3 (L) 39.0 -  52.0 %   MCV 83.2 80.0 - 100.0 fL   MCH 25.3 (L) 26.0 - 34.0 pg   MCHC 30.4 30.0 - 36.0 g/dL   RDW 17.7 (H) 11.5 - 15.5 %   Platelets 506 (H) 150 - 400 K/uL   nRBC 0.0 0.0 - 0.2 %   Neutrophils Relative % 64 %   Neutro Abs 7.8 (H) 1.7 - 7.7 K/uL   Lymphocytes Relative 26 %   Lymphs Abs 3.1 0.7 - 4.0 K/uL   Monocytes Relative 6 %   Monocytes Absolute 0.7 0.1 - 1.0 K/uL   Eosinophils Relative 2 %   Eosinophils Absolute 0.2 0.0 - 0.5 K/uL   Basophils Relative 1 %   Basophils Absolute 0.1 0.0 - 0.1 K/uL   Immature Granulocytes 1 %   Abs Immature Granulocytes 0.13 (H) 0.00 - 0.07 K/uL    Comment: Performed at [facility] [address], [place] [address] Comprehensive metabolic panel     Status: Abnormal   Collection Time: 09/24/20 11:51 PM  Result Value Ref Range   Sodium 138 135 - 145 mmol/L   Potassium 3.8 3.5 - 5.1 mmol/L   Chloride 105 98 - 111 mmol/L   CO2 26 22 - 32 mmol/L   Glucose, Bld 196 (H) 70 - 99 mg/dL    Comment: Glucose  reference range applies only to samples taken after fasting for at least 8 hours.   BUN 13 6 - 20 mg/dL   Creatinine, Ser 1.28 (H) 0.61 - 1.24 mg/dL   Calcium 8.9 8.9 - 10.3 mg/dL   Total Protein 7.1 6.5 - 8.1 g/dL   Albumin 2.5 (L) 3.5 - 5.0 g/dL   AST 15 15 - 41 U/L   ALT 13 0 - 44 U/L   Alkaline Phosphatase 114 38 - 126 U/L   Total Bilirubin 0.7 0.3 - 1.2 mg/dL   GFR, Estimated >60 >60 mL/min    Comment: (NOTE) Calculated using the CKD-EPI Creatinine Equation (2021)    Anion gap 7 5 - 15    Comment: Performed at [facility], [place] [address], [place], [place] [address]  Lipase, blood     Status: None   Collection Time: 09/24/20 11:51 PM  Result Value Ref Range   Lipase 34 11 - 51 U/L    Comment: Performed at [facility] [address], [place], [place] [address]  Urinalysis, Routine w reflex microscopic Urine, Clean Catch     Status: Abnormal   Collection Time: 09/24/20 11:55 PM  Result Value Ref Range    Color, Urine YELLOW YELLOW   APPearance CLEAR CLEAR   Specific Gravity, Urine 1.017 1.005 - 1.030   pH 5.0 5.0 - 8.0   Glucose, UA NEGATIVE NEGATIVE mg/dL   Hgb urine dipstick MODERATE (A) NEGATIVE   Bilirubin Urine NEGATIVE NEGATIVE   Ketones, ur NEGATIVE NEGATIVE mg/dL   Protein, ur 100 (A) NEGATIVE mg/dL   Nitrite NEGATIVE NEGATIVE   Leukocytes,Ua NEGATIVE NEGATIVE   RBC / HPF 6-10 0 - 5 RBC/hpf   WBC, UA 0-5 0 - 5 WBC/hpf   Bacteria, UA NONE SEEN NONE SEEN   Squamous Epithelial / LPF 0-5 0 - 5   Mucus PRESENT     Comment: Performed at [facility], [place] [address] [place], [place] [address]    US Abdomen Limited  Result Date: 09/25/2020 CLINICAL DATA:  Right upper quadrant pain EXAM: ULTRASOUND ABDOMEN LIMITED RIGHT UPPER QUADRANT COMPARISON:  None. FINDINGS: Gallbladder: Multiple stones within the gallbladder measuring up to 1.7 cm. There is sludge. Mild wall thickening at 3.6 mm. The patient was tender over the gallbladder during the study. Common bile duct: Diameter: Normal caliber, 3 mm Liver: No focal lesion identified. Within normal limits in parenchymal echogenicity. Portal vein is patent on color Doppler imaging with normal direction of blood flow towards the liver. Other: None. IMPRESSION: Stones and sludge within the gallbladder. Mild gallbladder wall thickening and tenderness over the gallbladder. Findings concerning for acute cholecystitis. Electronically Signed   By: Rolm Baptise M.D.   On: 09/25/2020 00:21   DG Foot 2 Views Left  Result Date: 09/25/2020 CLINICAL DATA:  55 year old male with history of swelling in the left foot and ulcer overlying the left heel. Evaluate for osteomyelitis. EXAM: LEFT FOOT - 2 VIEW COMPARISON:  Left foot radiograph 09/17/2020. FINDINGS: Two views of the left foot demonstrate irregular areas of sclerosis and lucency in the calcaneus. Small plantar calcaneal spur. No acute displaced fracture or dislocation. Multifocal joint space narrowing,  subchondral sclerosis, subchondral cyst formation and osteophyte formation is noted in the midfoot and at the first MTP joint, indicative of osteoarthritis. Pes planus. IMPRESSION: 1. In addition to chronic degenerative changes in the foot, there are irregular areas of sclerosis and lucency in the calcaneus. Given the patient's  history of heel ulcer, the possibility of chronic calcaneal osteomyelitis warrants consideration and further evaluation with left foot MRI with and without IV gadolinium is recommended to better evaluate this finding. Electronically Signed   By: Vinnie Langton M.D.   On: 09/25/2020 08:49    ROS 10 point review of systems is negative except as listed above in HPI.   Physical Exam Blood pressure (!) 177/95, pulse 97, temperature 98.6 F (37 C), temperature source Oral, resp. rate 14, height '6\' 4"'$  (1.93 m), weight 136.1 kg, SpO2 100 %. Constitutional: well-developed, well-nourished HEENT: pupils equal, round, reactive to light, 49m b/l, moist conjunctiva, external inspection of ears and nose normal, hearing intact Oropharynx: normal oropharyngeal mucosa, normal dentition Neck: no thyromegaly, trachea midline, no midline cervical tenderness to palpation Chest: breath sounds equal bilaterally, normal respiratory effort, no midline or lateral chest wall tenderness to palpation/deformity Abdomen: soft, RUQ TTP, no bruising, no hepatosplenomegaly GU: no blood at urethral meatus of penis, no scrotal masses or abnormality  Back: no wounds, no thoracic/lumbar spine tenderness to palpation, no thoracic/lumbar spine stepoffs Rectal: deferred Extremities: 2+ radial pulses bilaterally, motor and sensation intact to bilateral UE and LE, no peripheral edema MSK: unable to assess gait/station, no clubbing/cyanosis of fingers/toes, normal ROM of all four extremities, s/p R BKA with staples in place-wound appears healthy, L heel wound with eschar Skin: warm, dry, no rashes Psych: normal  memory, normal mood/affect    Assessment/Plan: 60M with poorly controlled DM and likely diabetic nephropathy, s/p recent R BKA 158mgo p/w abdominal pain. History, exam, and ultrasound findings c/w acute cholecystitis. Plan for lap chole. Informed consent was obtained after detailed explanation of risks, including bleeding, infection, biloma, hematoma, injury to common bile duct, need for IOC to delineate anatomy, and need for conversion to open procedure. All questions answered to the patient's satisfaction.   AyJesusita OkaMD General and TrFrench Campurgery

## 2020-09-25 NOTE — Op Note (Signed)
   Operative Note  Date: 09/25/2020  Procedure: laparoscopic cholecystectomy  Pre-op diagnosis: acute cholecystitis Post-op diagnosis: Acute calculous cholecystitis  Indication and clinical history: The patient is a 55 y.o. year old male with acute cholecystitis  Surgeon: Jesusita Oka, MD  Anesthesiologist: Jefm Petty, MD Anesthesia: General  Findings:  Specimen: gallbladder EBL: <10cc Drains/Implants: none  Disposition: PACU - hemodynamically stable.  Description of procedure: The patient was positioned supine on the operating room table. Time-out was performed verifying correct patient, procedure, signature of informed consent, and administration of pre-operative antibiotics. General anesthetic induction and intubation were uneventful. The abdomen was prepped and draped in the usual sterile fashion. An infra-umbilical incision was made using an open technique using zero vicryl stay sutures on either side of the fascia and a 76m Hassan port inserted. After establishing pneumoperitoneum, which the patient tolerated well, the abdominal cavity was inspected and no injury of any intra-abdominal structures was identified, although the sigmoid colon was immediately below the umbilical port site an. Additional ports were placed under direct visualization and using local anesthetic: two 564mports in the right subcostal region and a 33m51mort in the epigastric region. The patient was re-positioned to reverse Trendelenburg and right side up. Adhesiolysis was performed to expose the gallbladder, which was then retracted cephalad. The infundibulum was identified and retracted toward the right lower quadrant. The peritoneum was incised over the infundibulum and the triangle of Calot dissected to expose the critical view of safety. With clear identification and isolation of the cystic duct and cystic artery, the cystic artery was doubly clipped and divided. After this, the cystic duct was identified  as a single structure entering the gallbladder, and was also doubly clipped and divided. The gallbladder was dissected off the liver bed using electrocautery and hemostasis of the liver bed was confirmed prior to separation of the final peritoneal attachments of the gallbladder to the liver bed. There was minimal spillage of bile and gallstones during dissection. The gallbladder fossa was irrigated and the liver cauterized. After transection of the final peritoneal attachments, the gallbladder was placed in an endoscopic specimen retrieval bag, removed via the umbilical port site, and sent to pathology as a permanent specimen. The gallbladder fossa was inspected confirming hemostasis, the absence of bile leakage from the cystic duct stump, and correct placement of clips on the cystic artery and cystic duct stumps. The abdomen was desufflated and the fascia of the umbilical port site was closed using the previously placed stay sutures. Additional local anesthetic was administered at the umbilical port site.  The skin of all incisions was closed with 4-0 monocryl. Sterile dressings were applied. All sponge and instrument counts were correct at the conclusion of the procedure. The patient was awakened from anesthesia, extubated uneventfully, and transported to the PACU - hemodynamically stable.. There were no complications.   Upon entering the abdomen (organ space), I encountered infection of the gallbladder .  CASE DATA:  Type of patient?: [facility]OW CASE [facility]  Status of Case? URGENT Add On  Infection Present At Time Of Surgery (PATOS)?  INFECTION of the gallbladder    AyeJesusita OkaD General and [facility]

## 2020-09-25 NOTE — Anesthesia Preprocedure Evaluation (Addendum)
Anesthesia Evaluation  Patient identified by MRN, date of birth, ID band Patient awake    Reviewed: Allergy & Precautions, H&P , NPO status , Patient's Chart, lab work & pertinent test results  Airway Mallampati: II  TM Distance: >3 FB Neck ROM: Full    Dental no notable dental hx. (+) Teeth Intact, Dental Advisory Given   Pulmonary neg pulmonary ROS,    Pulmonary exam normal breath sounds clear to auscultation       Cardiovascular hypertension, Pt. on medications + Peripheral Vascular Disease   Rhythm:Regular Rate:Normal     Neuro/Psych negative neurological ROS  negative psych ROS   GI/Hepatic negative GI ROS, Neg liver ROS,   Endo/Other  diabetes, Insulin DependentMorbid obesity  Renal/GU Renal InsufficiencyRenal disease  negative genitourinary   Musculoskeletal   Abdominal   Peds  Hematology  (+) Blood dyscrasia, anemia ,   Anesthesia Other Findings   Reproductive/Obstetrics negative OB ROS                            Anesthesia Physical Anesthesia Plan  ASA: 3  Anesthesia Plan: General   Post-op Pain Management:    Induction: Intravenous  PONV Risk Score and Plan: 3 and Ondansetron, Dexamethasone and Midazolam  Airway Management Planned: Oral ETT  Additional Equipment:   Intra-op Plan:   Post-operative Plan: Extubation in OR  Informed Consent: I have reviewed the patients History and Physical, chart, labs and discussed the procedure including the risks, benefits and alternatives for the proposed anesthesia with the patient or authorized representative who has indicated his/her understanding and acceptance.     Dental advisory given  Plan Discussed with: CRNA  Anesthesia Plan Comments:         Anesthesia Quick Evaluation

## 2020-09-25 NOTE — ED Notes (Signed)
Pt refused hallway bed assignment, states he wants a room.

## 2020-09-26 ENCOUNTER — Encounter (HOSPITAL_COMMUNITY): Payer: Self-pay | Admitting: Surgery

## 2020-09-26 DIAGNOSIS — N1832 Chronic kidney disease, stage 3b: Secondary | ICD-10-CM

## 2020-09-26 LAB — CBC
HCT: 22.9 % — ABNORMAL LOW (ref 39.0–52.0)
Hemoglobin: 7.1 g/dL — ABNORMAL LOW (ref 13.0–17.0)
MCH: 25.3 pg — ABNORMAL LOW (ref 26.0–34.0)
MCHC: 31 g/dL (ref 30.0–36.0)
MCV: 81.5 fL (ref 80.0–100.0)
Platelets: 507 10*3/uL — ABNORMAL HIGH (ref 150–400)
RBC: 2.81 MIL/uL — ABNORMAL LOW (ref 4.22–5.81)
RDW: 17.8 % — ABNORMAL HIGH (ref 11.5–15.5)
WBC: 16.7 10*3/uL — ABNORMAL HIGH (ref 4.0–10.5)
nRBC: 0 % (ref 0.0–0.2)

## 2020-09-26 LAB — GLUCOSE, CAPILLARY
Glucose-Capillary: 120 mg/dL — ABNORMAL HIGH (ref 70–99)
Glucose-Capillary: 89 mg/dL (ref 70–99)
Glucose-Capillary: 157 mg/dL — ABNORMAL HIGH (ref 70–99)
Glucose-Capillary: 149 mg/dL — ABNORMAL HIGH (ref 70–99)

## 2020-09-26 LAB — COMPREHENSIVE METABOLIC PANEL
ALT: 16 U/L (ref 0–44)
AST: 26 U/L (ref 15–41)
Albumin: 2.3 g/dL — ABNORMAL LOW (ref 3.5–5.0)
Alkaline Phosphatase: 92 U/L (ref 38–126)
Anion gap: 8 (ref 5–15)
BUN: 10 mg/dL (ref 6–20)
CO2: 26 mmol/L (ref 22–32)
Calcium: 8.5 mg/dL — ABNORMAL LOW (ref 8.9–10.3)
Chloride: 103 mmol/L (ref 98–111)
Creatinine, Ser: 1.12 mg/dL (ref 0.61–1.24)
GFR, Estimated: >60 mL/min (ref >60)
Glucose, Bld: 161 mg/dL — ABNORMAL HIGH (ref 70–99)
Potassium: 3.8 mmol/L (ref 3.5–5.1)
Sodium: 137 mmol/L (ref 135–145)
Total Bilirubin: 0.6 mg/dL (ref 0.3–1.2)
Total Protein: 6.1 g/dL — ABNORMAL LOW (ref 6.5–8.1)

## 2020-09-26 MED ORDER — METHOCARBAMOL 750 MG PO TABS: 750.0000 mg | ORAL_TABLET | Freq: Four times a day (QID) | ORAL | Status: DC | PRN

## 2020-09-26 MED ORDER — ACETAMINOPHEN 500 MG PO TABS
1000.0000 mg | ORAL_TABLET | Freq: Four times a day (QID) | ORAL | Status: DC
  Administered 2020-09-26 – 2020-09-29 (×12): 1000 mg via ORAL
  Filled 2020-09-26 (×14): qty 2

## 2020-09-26 MED ORDER — POLYETHYLENE GLYCOL 3350 17 G PO PACK: 17.0000 g | PACK | Freq: Every day | ORAL | Status: DC | PRN

## 2020-09-26 NOTE — Progress Notes (Addendum)
Progress Note  1 Day Post-Op  Subjective: CC: feels bloated Abdominal pain and bloating after surgery. No BM and not passing much flatus. Has not been ambulatory or up to chair in setting of recent BKA. He had been mobilizing with walker and wheelchair prior to lap chole. He is tolerating diet without nausea or emesis.    Objective: Vital signs in last 24 hours: Temp:  [98 F (36.7 C)-99.5 F (37.5 C)] 98.9 F (37.2 C) (08/08 0803) Pulse Rate:  [78-108] 102 (08/08 0803) Resp:  [13-21] 16 (08/08 0803) BP: (143-198)/(87-118) 156/98 (08/08 0803) SpO2:  [94 %-100 %] 100 % (08/08 0803)    Intake/Output from previous day: 08/07 0701 - 08/08 0700 In: 2593.5 [P.O.:240; I.V.:1803.5; IV Piggyback:550] Out: 990 [Urine:950; Blood:40] Intake/Output this shift: No intake/output data recorded.  PE: General: pleasant, WD, male who is laying in bed in NAD HEENT: head is normocephalic, atraumatic. Mouth is pink and moist Heart: regular, rate, and rhythm.  Palpable radial pulses Lungs: CTAB, no wheezes, rhonchi, or rales noted.  Respiratory effort nonlabored Abd: soft, +BS, protuberant. Incision with glue intact - no erythema or discharge. Mild expected  post operative TTP MSK: LLE edematous Skin: warm and dry with no masses, lesions, or rashes Psych: A&Ox3 with an appropriate affect.    Lab Results:  Recent Labs    09/24/20 2351 09/26/20 0022  WBC 12.0* 16.7*  HGB 8.3* 7.1*  HCT 27.3* 22.9*  PLT 506* 507*   BMET Recent Labs    09/24/20 2351 09/26/20 0022  NA 138 137  K 3.8 3.8  CL 105 103  CO2 26 26  GLUCOSE 196* 161*  BUN 13 10  CREATININE 1.28* 1.12  CALCIUM 8.9 8.5*   PT/INR No results for input(s): LABPROT, INR in the last 72 hours. CMP     Component Value Date/Time   NA 137 09/26/2020 0022   K 3.8 09/26/2020 0022   CL 103 09/26/2020 0022   CO2 26 09/26/2020 0022   GLUCOSE 161 (H) 09/26/2020 0022   BUN 10 09/26/2020 0022   CREATININE 1.12 09/26/2020 0022    CALCIUM 8.5 (L) 09/26/2020 0022   PROT 6.1 (L) 09/26/2020 0022   ALBUMIN 2.3 (L) 09/26/2020 0022   AST 26 09/26/2020 0022   ALT 16 09/26/2020 0022   ALKPHOS 92 09/26/2020 0022   BILITOT 0.6 09/26/2020 0022   GFRNONAA >60 09/26/2020 0022   GFRAA 52 (L) 05/07/2019 0641   Lipase     Component Value Date/Time   LIPASE 34 09/24/2020 2351       Studies/Results: US Abdomen Limited  Result Date: 09/25/2020 CLINICAL DATA:  Right upper quadrant pain EXAM: ULTRASOUND ABDOMEN LIMITED RIGHT UPPER QUADRANT COMPARISON:  None. FINDINGS: Gallbladder: Multiple stones within the gallbladder measuring up to 1.7 cm. There is sludge. Mild wall thickening at 3.6 mm. The patient was tender over the gallbladder during the study. Common bile duct: Diameter: Normal caliber, 3 mm Liver: No focal lesion identified. Within normal limits in parenchymal echogenicity. Portal vein is patent on color Doppler imaging with normal direction of blood flow towards the liver. Other: None. IMPRESSION: Stones and sludge within the gallbladder. Mild gallbladder wall thickening and tenderness over the gallbladder. Findings concerning for acute cholecystitis. Electronically Signed   By: Rolm Baptise M.D.   On: 09/25/2020 00:21   DG Foot 2 Views Left  Result Date: 09/25/2020 CLINICAL DATA:  55 year old male with history of swelling in the left foot and ulcer overlying the left  heel. Evaluate for osteomyelitis. EXAM: LEFT FOOT - 2 VIEW COMPARISON:  Left foot radiograph 09/17/2020. FINDINGS: Two views of the left foot demonstrate irregular areas of sclerosis and lucency in the calcaneus. Small plantar calcaneal spur. No acute displaced fracture or dislocation. Multifocal joint space narrowing, subchondral sclerosis, subchondral cyst formation and osteophyte formation is noted in the midfoot and at the first MTP joint, indicative of osteoarthritis. Pes planus. IMPRESSION: 1. In addition to chronic degenerative changes in the foot, there  are irregular areas of sclerosis and lucency in the calcaneus. Given the patient's history of heel ulcer, the possibility of chronic calcaneal osteomyelitis warrants consideration and further evaluation with left foot MRI with and without IV gadolinium is recommended to better evaluate this finding. Electronically Signed   By: Vinnie Langton M.D.   On: 09/25/2020 08:49    Anti-infectives: Anti-infectives (From admission, onward)    Start     Dose/Rate Route Frequency Ordered Stop   09/26/20 1000  cefTRIAXone (ROCEPHIN) 2 g in sodium chloride 0.9 % 100 mL IVPB        2 g 200 mL/hr over 30 Minutes Intravenous Every 24 hours 09/25/20 1057     09/25/20 1100  metroNIDAZOLE (FLAGYL) IVPB 500 mg        500 mg 100 mL/hr over 60 Minutes Intravenous Every 8 hours 09/25/20 1054     09/25/20 1100  cefTRIAXone (ROCEPHIN) 1 g in sodium chloride 0.9 % 100 mL IVPB        1 g 200 mL/hr over 30 Minutes Intravenous  Once 09/25/20 1057     09/25/20 0815  cefTRIAXone (ROCEPHIN) 1 g in sodium chloride 0.9 % 100 mL IVPB        1 g 200 mL/hr over 30 Minutes Intravenous  Once 09/25/20 0808 09/25/20 0902        Assessment/Plan  Cholelithiasis with acute calculous cholecystitis - POD1 s/p lap chole 8/7 - Dr. Bobbye Morton - WBC 16.7 (12.0) - monitor. Continue IV abx - Hgb 7.1 (8.3) - multimodal pain control - scheduled tylenol, prn robaxin - mobilize with PT/OT  FEN: carb mod ID: ceftriaxone/flagyl VTE: SCDs, okay to start chemical prophylaxis today  HTN T2DM Constipation Gout Anemia CKD S/p BKA     LOS: 1 day    Winferd Humphrey, [facility] Surgery 09/26/2020, 8:12 AM Please see Amion for pager number during day hours 7:00am-4:30pm

## 2020-09-26 NOTE — Progress Notes (Signed)
Physical Therapy Evaluation Patient Details Name: Patrick [name] MRN: [id number] DOB: [birth year] Today's Date: 09/26/2020   History of Present Illness  Pt is a 55yo male admitted on 8/6 with complaints of right-sided flank pain secondary to acute cholescystis. S/p laparoscopic cholescystectomy on 8/7. Also complaining of left heel ulcer; xray suggestive of possible osteomyolitis. Of note: pt fell on 8/4 and was seen at [facility] ED. 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  Clinical Impression  Pt presents with the impairments above and problems listed below. Pt required supervision for bed mobility and min guard assist for bed to chair transfer. Educated pt on utilizing anterior-posterior transfer for increased safety while in hospital. Pt currently living in extended stay hotel with intermittent help from friends and currently is a fall risk for transfers and functional mobility s/p R BKA. Reports difficulty caring for himself at home and has had multiple falls. Recommending SNF-level therapies upon discharge. We will follow this patient acutely to promote independence with functional mobility.     Follow Up Recommendations SNF;Supervision for mobility/OOB    Equipment Recommendations  Rolling walker with 5" wheels;3in1 (PT)    Recommendations for Other Services       Precautions / Restrictions Precautions Precautions: Fall Precaution Comments: recent R BKA Restrictions Weight Bearing Restrictions: Yes RLE Weight Bearing: Non weight bearing      Mobility  Bed Mobility Overal bed mobility: Needs Assistance Bed Mobility: Supine to Sit     Supine to sit: Supervision;HOB elevated     General bed mobility comments: Pt required supervision for safety as well as increased time.    Transfers Overall transfer level: Needs assistance Equipment used: None Transfers: Squat Pivot Transfers     Squat pivot transfers: Min guard     General transfer comment: Pt with  questionable safety during squat pivot. He was able to shift L side hip to chair with other hip off of chair. Then scooted back into chair via BUE and LLE lift. Pt educated on AP transfer for increased safety.  Ambulation/Gait                Stairs            Wheelchair Mobility    Modified Rankin (Stroke Patients Only)       Balance Overall balance assessment: Needs assistance Sitting-balance support: Feet supported;No upper extremity supported Sitting balance-Leahy Scale: Fair                                       Pertinent Vitals/Pain Pain Assessment: 0-10 Pain Score: 7  Pain Location: Stomach, left knee Pain Descriptors / Indicators: Shooting;Operative site guarding Pain Intervention(s): Limited activity within patient's tolerance;Repositioned    Home Living Family/patient expects to be discharged to:: Unsure Living Arrangements: Non-relatives/Friends Available Help at Discharge: Friend(s);Available PRN/intermittently Type of Home: Other(Comment) (extended-stay hotel) Home Access: Level entry     Home Layout: One level Home Equipment: Wheelchair - manual;Crutches;Walker - 4 wheels Additional Comments: Pt is living at an extended stay hotel and reports that it is not handicap accessible and struggles with ADLs, mobility, and transfers.    Prior Function Level of Independence: Needs assistance   Gait / Transfers Assistance Needed: uses WC independently, but needs assist with car transfers. Also uses rollator to help with home mobility (such as WC to Bed transfer)  ADL's / Homemaking Assistance Needed:  Needs help due to inaccessible environment; but pt reports that with additional training and appropriate setup feels he could be independent        Hand Dominance   Dominant Hand: Right    Extremity/Trunk Assessment   Upper Extremity Assessment Upper Extremity Assessment: Defer to OT evaluation    Lower Extremity Assessment Lower  Extremity Assessment: Generalized weakness;RLE deficits/detail;LLE deficits/detail RLE Deficits / Details: Residual limb inspected, staples present, skin dry and cracking, some dependent edema. Pt reports some phantom limb pain and sensations like itching. LLE Deficits / Details: Pt reported increased reliance on LLE since amputation with associated L knee pain. L calcaneal wound covered with dressing    Cervical / Trunk Assessment Cervical / Trunk Assessment: Normal  Communication   Communication: No difficulties  Cognition Arousal/Alertness: Awake/alert Behavior During Therapy: WFL for tasks assessed/performed Overall Cognitive Status: Within Functional Limits for tasks assessed                                 General Comments: pt motivated to participate      General Comments General comments (skin integrity, edema, etc.): Pt motivated to learn mobility skills and how to modify his enviornment. Some expressions of sadness at current situation.    Exercises     Assessment/Plan    PT Assessment Patient needs continued PT services  PT Problem List Decreased strength;Decreased range of motion;Decreased activity tolerance;Decreased balance;Decreased mobility;Decreased coordination;Decreased cognition;Decreased knowledge of use of DME;Decreased safety awareness;Pain;Decreased skin integrity       PT Treatment Interventions DME instruction;Functional mobility training;Therapeutic activities;Therapeutic exercise;Balance training;Wheelchair mobility training;Gait training;Patient/family education    PT Goals (Current goals can be found in the Care Plan section)  Acute Rehab PT Goals Patient Stated Goal: to improve functional mobility PT Goal Formulation: With patient Time For Goal Achievement: 10/10/20 Potential to Achieve Goals: Good    Frequency Min 3X/week   Barriers to discharge Inaccessible home environment;Decreased caregiver support Pt living at extended stay  hotel and friends help intermittently. Pt reported his hotel room is not accessible and his recent fall was secondary to the inaccessible environment.    Co-evaluation               AM-PAC PT "6 Clicks" Mobility  Outcome Measure Help needed turning from your back to your side while in a flat bed without using bedrails?: A Little Help needed moving from lying on your back to sitting on the side of a flat bed without using bedrails?: A Little Help needed moving to and from a bed to a chair (including a wheelchair)?: A Little Help needed standing up from a chair using your arms (e.g., wheelchair or bedside chair)?: A Lot Help needed to walk in hospital room?: Total Help needed climbing 3-5 steps with a railing? : Total 6 Click Score: 13    End of Session Equipment Utilized During Treatment: Gait belt Activity Tolerance: Patient limited by pain Patient left: in chair;with chair alarm set;with call bell/phone within reach Nurse Communication: Mobility status;Other (comment) (Utilizing AP transfer for return to bed) PT Visit Diagnosis: Pain;Muscle weakness (generalized) (M62.81);History of falling (Z91.81);Other abnormalities of gait and mobility (R26.89) Pain - Right/Left: Left Pain - part of body: Knee    Time: [id number] PT Time Calculation (min) (ACUTE ONLY): 31 min   Charges:   PT Evaluation $PT Eval Moderate Complexity: 1 Mod PT Treatments $Therapeutic Activity: 8-22 mins  Dawayne Cirri, SPT  Dawayne Cirri 09/26/2020, 1:34 PM

## 2020-09-26 NOTE — Discharge Instructions (Signed)
[facility]  Please arrive at least 30 min before your appointment to complete your check in paperwork.  If you are unable to arrive 30 min prior to your appointment time we may have to cancel or reschedule you. LAPAROSCOPIC SURGERY: POST OP INSTRUCTIONS Always review your discharge instruction sheet given to you by the facility where your surgery was performed. IF YOU HAVE DISABILITY OR FAMILY LEAVE FORMS, YOU MUST BRING THEM TO THE OFFICE FOR PROCESSING.   DO NOT GIVE THEM TO YOUR DOCTOR.  PAIN CONTROL  First take acetaminophen (Tylenol) AND/or ibuprofen (Advil) to control your pain after surgery.  Follow directions on package.  Taking acetaminophen (Tylenol) and/or ibuprofen (Advil) regularly after surgery will help to control your pain and lower the amount of prescription pain medication you may need.  You should not take more than 4,000 mg (4 grams) of acetaminophen (Tylenol) in 24 hours.  You should not take ibuprofen (Advil), aleve, motrin, naprosyn or other NSAIDS if you have a history of stomach ulcers or chronic kidney disease.  A prescription for pain medication may be given to you upon discharge.  Take your pain medication as prescribed, if you still have uncontrolled pain after taking acetaminophen (Tylenol) or ibuprofen (Advil). Use ice packs to help control pain. If you need a refill on your pain medication, please contact your pharmacy.  They will contact our office to request authorization. Prescriptions will not be filled after 5pm or on week-ends.  HOME MEDICATIONS Take your usually prescribed medications unless otherwise directed.  DIET You should follow a light diet the first few days after arrival home.  Be sure to include lots of fluids daily. Avoid fatty, fried foods.   CONSTIPATION It is common to experience some constipation after surgery and if you are taking pain medication.  Increasing fluid intake and taking a stool softener (such as Colace)  will usually help or prevent this problem from occurring.  A mild laxative (Milk of Magnesia or Miralax) should be taken according to package instructions if there are no bowel movements after 48 hours.  WOUND/INCISION CARE Most patients will experience some swelling and bruising in the area of the incisions.  Ice packs will help.  Swelling and bruising can take several days to resolve.  Unless discharge instructions indicate otherwise, follow guidelines below  STERI-STRIPS - you may remove your outer bandages 48 hours after surgery, and you may shower at that time.  You have steri-strips (small skin tapes) in place directly over the incision.  These strips should be left on the skin for 7-10 days.   DERMABOND/SKIN GLUE - you may shower in 24 hours.  The glue will flake off over the next 2-3 weeks. Any sutures or staples will be removed at the office during your follow-up visit.  ACTIVITIES You may resume regular (light) daily activities beginning the next day--such as daily self-care, walking, climbing stairs--gradually increasing activities as tolerated.  You may have sexual intercourse when it is comfortable.  Refrain from any heavy lifting or straining until approved by your doctor. You may drive when you are no longer taking prescription pain medication, you can comfortably wear a seatbelt, and you can safely maneuver your car and apply brakes.  FOLLOW-UP You should see your doctor in the office for a follow-up appointment approximately 2-3 weeks after your surgery.  You should have been given your post-op/follow-up appointment when your surgery was scheduled.  If you did not receive a post-op/follow-up appointment, make sure   that you call for this appointment within a day or two after you arrive home to insure a convenient appointment time.   WHEN TO CALL YOUR DOCTOR: Fever over 101.0 Inability to urinate Continued bleeding from incision. Increased pain, redness, or drainage from the  incision. Increasing abdominal pain  The clinic staff is available to answer your questions during regular business hours.  Please don't hesitate to call and ask to speak to one of the nurses for clinical concerns.  If you have a medical emergency, go to the nearest emergency room or call 911.  A surgeon from [facility] is always on call at the hospital. [address], [address], [place], [place]  [address] ? [address] [address], , [place]   [address] [phone] ? [phone] ? FAX [phone]  

## 2020-09-26 NOTE — Consult Note (Addendum)
[facility] Nurse Consult Note: Patient receiving care in [phone] Reason for Consult: Left heel wound Wound type: DTPI on the left heel with open areas on the posterior side most likely evolving related to his diabetes.  Right heel wound Pressure Injury POA: Yes Wound bed: 50% black, 50% open pink/moist Drainage (amount, consistency, odor)  Periwound:Intact Dressing procedure/placement/frequency:  Wrap bilateral heels with a piece of Xeroform gauze to the open areas and secure with a Mepilex Heel foam dressing. Change the Xeroform daily. Place the feet in a Prevalon Heel lift boot.   Pressure Injury Prevention Bundle Support surfaces (air mattress) chair cushion Kellie Simmering # [phone]) Heel offloading boots Kellie Simmering # [phone]) Turning and Positioning  Measures to reduce shear (draw sheet, knees up) Skin protection Products (Foam dressing) Moisture management products (Critic-Aid Barrier Cream (Purple top) Nutrition Management Protection for Medical Devices Routine Skin Assessment   Monitor the wound area(s) for worsening of condition such as: Signs/symptoms of infection, increase in size, development of or worsening of odor, development of pain, or increased pain at the affected locations.   Notify the medical team if any of these develop.  Thank you for the consult. [facility] nurse will not follow at this time.   Please re-consult the [facility] team if needed.  Cathlean Marseilles Tamala Julian, MSN, RN, [place], Lysle Pearl, [facility] Wound Treatment Associate Pager [phone]

## 2020-09-26 NOTE — Plan of Care (Signed)

## 2020-09-26 NOTE — Plan of Care (Signed)

## 2020-09-26 NOTE — Progress Notes (Signed)
PROGRESS NOTE    Patrick [name]  [id number] DOB: 12-20-1965 DOA: 09/24/2020 PCP: Patrick [name], [facility]   Brief Narrative: Patrick [name] is a 55 y.o. male with a hypertension, diabetes mellitus type 2, gout, CKD stage III, peripheral vascular disease s/p right BKA, left foot ulcer. Patient presented secondary to abdominal pain and found to have acute cholecystitis. General surgery consulted. Antibiotics started. Cholecystectomy performed on 8/7.   Assessment & Plan:   Principal Problem:   Cholelithiasis with acute cholecystitis Active Problems:   GOUT   HYPERTENSION, BENIGN ESSENTIAL   Diabetic foot ulcer (HCC)   CKD (chronic kidney disease) stage 3, GFR 30-59 ml/min (HCC)   Anemia   S/P BKA (below knee amputation) unilateral, right (HCC)   Hypertensive urgency   Constipation   Thrombocytosis   Acute cholecystitis Started on Ceftriaxone and Flagyl. General surgery consulted and performed laparoscopic cholecystectomy on 8/7. Cleared for discharge home from a general surgery standpoint. PT recommending SNF. Discontinue antibiotics.  Diabetic foot ulcer Recently admitted and treated with antibiotics. Was on Augmentin and doxycycline. Patient will follow-up with orthopedic surgery as an outpatient.  Hypertensive urgency Patient is on losartan, hydrochlorothiazide and metoprolol as an outpatient. Home regimen of losartan and metoprolol resumed on admission. -Continue losartan 100 mg daily and metoprolol 12.5 mg BID  Constipation -Continue Miralax, Senokot-S  Diabetes mellitus, type 2 Diabetic neuropathy Patient is on Lantus and Novolog as an outpatient. Hemoglobin A1C of 7.8%. -Continue Lantus 20 units daily and Novolog 4 TID -Continue gabapentin  CKD stage IIIb Stable.  Thrombocytosis Mild. In setting of acute infection and likely reactive.   DVT prophylaxis: Lovenox Code Status:   Code Status: Full Code Family Communication: None Disposition Plan: Discharge to SNF  when bed is available   Consultants:  General surgery  Procedures:  LAPAROSCOPIC CHOLECYSTECTOMY (09/26/2020)  Antimicrobials: Ceftriaxone IV Flagyl IV    Subjective: Some abdominal pain but no other issues.  Objective: Vitals:   09/26/20 0041 09/26/20 0323 09/26/20 0803 09/26/20 1133  BP: (!) 156/106 (!) 143/88 (!) 156/98 (!) 155/108  Pulse: (!) 108 (!) 106 (!) 102 100  Resp: '17 18 16 18  '$ Temp: 99.4 F (37.4 C) 99.5 F (37.5 C) 98.9 F (37.2 C) 98.1 F (36.7 C)  TempSrc: Oral Oral Oral Oral  SpO2: 100% 98% 100% 100%  Weight:      Height:        Intake/Output Summary (Last 24 hours) at 09/26/2020 1625 Last data filed at 09/26/2020 0644 Gross per 24 hour  Intake 1443.49 ml  Output 950 ml  Net 493.49 ml   Filed Weights   09/24/20 2336  Weight: 136.1 kg    Examination:  General exam: Appears calm and comfortable Respiratory system: Clear to auscultation. Respiratory effort normal. Cardiovascular system: S1 & S2 heard, RRR. No murmurs, rubs, gallops or clicks. Gastrointestinal system: Abdomen is nondistended, soft and nontender. No organomegaly or masses felt. Normal bowel sounds heard. Central nervous system: Alert and oriented. No focal neurological deficits. Musculoskeletal: No edema. No calf tenderness. Right BKA stump Skin: No cyanosis. No rashes Psychiatry: Judgement and insight appear normal. Mood & affect appropriate.     Data Reviewed: I have personally reviewed following labs and imaging studies  CBC Lab Results  Component Value Date   WBC 16.7 (H) 09/26/2020   RBC 2.81 (L) 09/26/2020   HGB 7.1 (L) 09/26/2020   HCT 22.9 (L) 09/26/2020   MCV 81.5 09/26/2020   MCH 25.3 (L) 09/26/2020  PLT 507 (H) 09/26/2020   MCHC 31.0 09/26/2020   RDW 17.8 (H) 09/26/2020   LYMPHSABS 3.1 09/24/2020   MONOABS 0.7 09/24/2020   EOSABS 0.2 09/24/2020   BASOSABS 0.1 0000000     Last metabolic panel Lab Results  Component Value Date   NA 137 09/26/2020    K 3.8 09/26/2020   CL 103 09/26/2020   CO2 26 09/26/2020   BUN 10 09/26/2020   CREATININE 1.12 09/26/2020   GLUCOSE 161 (H) 09/26/2020   GFRNONAA >60 09/26/2020   GFRAA 52 (L) 05/07/2019   CALCIUM 8.5 (L) 09/26/2020   PROT 6.1 (L) 09/26/2020   ALBUMIN 2.3 (L) 09/26/2020   BILITOT 0.6 09/26/2020   ALKPHOS 92 09/26/2020   AST 26 09/26/2020   ALT 16 09/26/2020   ANIONGAP 8 09/26/2020    CBG (last 3)  Recent Labs    09/25/20 2112 09/26/20 0800 09/26/20 1129  GLUCAP 170* 120* 89     GFR: Estimated Creatinine Clearance: 114.9 mL/min (by C-G formula based on SCr of 1.12 mg/dL).  Coagulation Profile: No results for input(s): INR, PROTIME in the last 168 hours.  Recent Results (from the past 240 hour(s))  Blood culture (routine x 2)     Status: None   Collection Time: 09/17/20  1:00 AM   Specimen: BLOOD  Result Value Ref Range Status   Specimen Description BLOOD RIGHT ARM  Final   Special Requests   Final    BOTTLES DRAWN AEROBIC AND ANAEROBIC Blood Culture adequate volume   Culture   Final    NO GROWTH 5 DAYS Performed at [facility], [address] [place], [place] [address]    Report Status 09/22/2020 FINAL  Final  Blood culture (routine x 2)     Status: None   Collection Time: 09/17/20  1:16 AM   Specimen: BLOOD  Result Value Ref Range Status   Specimen Description BLOOD LEFT ARM  Final   Special Requests   Final    BOTTLES DRAWN AEROBIC ONLY Blood Culture adequate volume   Culture   Final    NO GROWTH 5 DAYS Performed at [facility], [place] [address] [place], [place] [address]    Report Status 09/22/2020 FINAL  Final  Resp Panel by RT-PCR (Flu A&B, Covid) Nasopharyngeal Swab     Status: None   Collection Time: 09/17/20  1:20 AM   Specimen: Nasopharyngeal Swab; Nasopharyngeal(NP) swabs in vial transport medium  Result Value Ref Range Status   SARS Coronavirus 2 by RT PCR NEGATIVE NEGATIVE Final    Comment: (NOTE) SARS-CoV-2 target nucleic acids  are NOT DETECTED.  The SARS-CoV-2 RNA is generally detectable in upper respiratory specimens during the acute phase of infection. The lowest concentration of SARS-CoV-2 viral copies this assay can detect is 138 copies/mL. A negative result does not preclude SARS-Cov-2 infection and should not be used as the sole basis for treatment or other patient management decisions. A negative result may occur with  improper specimen collection/handling, submission of specimen other than nasopharyngeal swab, presence of viral mutation(s) within the areas targeted by this assay, and inadequate number of viral copies(<138 copies/mL). A negative result must be combined with clinical observations, patient history, and epidemiological information. The expected result is Negative.  Fact Sheet for Patients:  EntrepreneurPulse.com.au  Fact Sheet for Healthcare Providers:  IncredibleEmployment.be  This test is no t yet approved or cleared by the [place] FDA and  has been authorized for detection and/or  diagnosis of SARS-CoV-2 by FDA under an Emergency Use Authorization (EUA). This EUA will remain  in effect (meaning this test can be used) for the duration of the COVID-19 declaration under Section 564(b)(1) of the Act, 21 U.S.C.section 360bbb-3(b)(1), unless the authorization is terminated  or revoked sooner.       Influenza A by PCR NEGATIVE NEGATIVE Final   Influenza B by PCR NEGATIVE NEGATIVE Final    Comment: (NOTE) The Xpert Xpress SARS-CoV-2/FLU/RSV plus assay is intended as an aid in the diagnosis of influenza from Nasopharyngeal swab specimens and should not be used as a sole basis for treatment. Nasal washings and aspirates are unacceptable for Xpert Xpress SARS-CoV-2/FLU/RSV testing.  Fact Sheet for Patients: EntrepreneurPulse.com.au  Fact Sheet for Healthcare Providers: IncredibleEmployment.be  This test is  not yet approved or cleared by the [place] FDA and has been authorized for detection and/or diagnosis of SARS-CoV-2 by FDA under an Emergency Use Authorization (EUA). This EUA will remain in effect (meaning this test can be used) for the duration of the COVID-19 declaration under Section 564(b)(1) of the Act, 21 U.S.C. section 360bbb-3(b)(1), unless the authorization is terminated or revoked.  Performed at [facility], [place] [address], [place], [place] [address]   Resp Panel by RT-PCR (Flu A&B, Covid) Nasopharyngeal Swab     Status: None   Collection Time: 09/25/20  7:33 AM   Specimen: Nasopharyngeal Swab; Nasopharyngeal(NP) swabs in vial transport medium  Result Value Ref Range Status   SARS Coronavirus 2 by RT PCR NEGATIVE NEGATIVE Final    Comment: (NOTE) SARS-CoV-2 target nucleic acids are NOT DETECTED.  The SARS-CoV-2 RNA is generally detectable in upper respiratory specimens during the acute phase of infection. The lowest concentration of SARS-CoV-2 viral copies this assay can detect is 138 copies/mL. A negative result does not preclude SARS-Cov-2 infection and should not be used as the sole basis for treatment or other patient management decisions. A negative result may occur with  improper specimen collection/handling, submission of specimen other than nasopharyngeal swab, presence of viral mutation(s) within the areas targeted by this assay, and inadequate number of viral copies(<138 copies/mL). A negative result must be combined with clinical observations, patient history, and epidemiological information. The expected result is Negative.  Fact Sheet for Patients:  EntrepreneurPulse.com.au  Fact Sheet for Healthcare Providers:  IncredibleEmployment.be  This test is no t yet approved or cleared by the [place] FDA and  has been authorized for detection and/or diagnosis of SARS-CoV-2 by FDA under an Emergency Use  Authorization (EUA). This EUA will remain  in effect (meaning this test can be used) for the duration of the COVID-19 declaration under Section 564(b)(1) of the Act, 21 U.S.C.section 360bbb-3(b)(1), unless the authorization is terminated  or revoked sooner.       Influenza A by PCR NEGATIVE NEGATIVE Final   Influenza B by PCR NEGATIVE NEGATIVE Final    Comment: (NOTE) The Xpert Xpress SARS-CoV-2/FLU/RSV plus assay is intended as an aid in the diagnosis of influenza from Nasopharyngeal swab specimens and should not be used as a sole basis for treatment. Nasal washings and aspirates are unacceptable for Xpert Xpress SARS-CoV-2/FLU/RSV testing.  Fact Sheet for Patients: EntrepreneurPulse.com.au  Fact Sheet for Healthcare Providers: IncredibleEmployment.be  This test is not yet approved or cleared by the [place] FDA and has been authorized for detection and/or diagnosis of SARS-CoV-2 by FDA under an Emergency Use Authorization (EUA). This EUA will remain in effect (meaning this test can  be used) for the duration of the COVID-19 declaration under Section 564(b)(1) of the Act, 21 U.S.C. section 360bbb-3(b)(1), unless the authorization is terminated or revoked.  Performed at [facility], [place] [address] [place], [place] [address]         Radiology Studies: US Abdomen Limited  Result Date: 09/25/2020 CLINICAL DATA:  Right upper quadrant pain EXAM: ULTRASOUND ABDOMEN LIMITED RIGHT UPPER QUADRANT COMPARISON:  None. FINDINGS: Gallbladder: Multiple stones within the gallbladder measuring up to 1.7 cm. There is sludge. Mild wall thickening at 3.6 mm. The patient was tender over the gallbladder during the study. Common bile duct: Diameter: Normal caliber, 3 mm Liver: No focal lesion identified. Within normal limits in parenchymal echogenicity. Portal vein is patent on color Doppler imaging with normal direction of blood flow towards the  liver. Other: None. IMPRESSION: Stones and sludge within the gallbladder. Mild gallbladder wall thickening and tenderness over the gallbladder. Findings concerning for acute cholecystitis. Electronically Signed   By: Rolm Baptise M.D.   On: 09/25/2020 00:21   DG Foot 2 Views Left  Result Date: 09/25/2020 CLINICAL DATA:  55 year old male with history of swelling in the left foot and ulcer overlying the left heel. Evaluate for osteomyelitis. EXAM: LEFT FOOT - 2 VIEW COMPARISON:  Left foot radiograph 09/17/2020. FINDINGS: Two views of the left foot demonstrate irregular areas of sclerosis and lucency in the calcaneus. Small plantar calcaneal spur. No acute displaced fracture or dislocation. Multifocal joint space narrowing, subchondral sclerosis, subchondral cyst formation and osteophyte formation is noted in the midfoot and at the first MTP joint, indicative of osteoarthritis. Pes planus. IMPRESSION: 1. In addition to chronic degenerative changes in the foot, there are irregular areas of sclerosis and lucency in the calcaneus. Given the patient's history of heel ulcer, the possibility of chronic calcaneal osteomyelitis warrants consideration and further evaluation with left foot MRI with and without IV gadolinium is recommended to better evaluate this finding. Electronically Signed   By: Vinnie Langton M.D.   On: 09/25/2020 08:49        Scheduled Meds:  acetaminophen  1,000 mg Oral Q6H   allopurinol  100 mg Oral BID   ascorbic acid  1,000 mg Oral Daily   enoxaparin (LOVENOX) injection  70 mg Subcutaneous Q24H   gabapentin  300 mg Oral TID   insulin aspart  0-9 Units Subcutaneous TID WC   insulin aspart  4 Units Subcutaneous TID WC   insulin glargine  20 Units Subcutaneous BID   losartan  100 mg Oral Daily   metoprolol tartrate  12.5 mg Oral BID   senna-docusate  1 tablet Oral BID   zinc sulfate  220 mg Oral Daily   Continuous Infusions:  sodium chloride 100 mL/hr at 09/26/20 0649    cefTRIAXone (ROCEPHIN)  IV     cefTRIAXone (ROCEPHIN)  IV 2 g (09/26/20 0951)   metronidazole 500 mg (09/26/20 1133)     LOS: 1 day     Cordelia Poche, MD [facility] 09/26/2020, 4:25 PM  If 7PM-7AM, please contact night-coverage [email or web address]

## 2020-09-27 LAB — CBC
HCT: 24.5 % — ABNORMAL LOW (ref 39.0–52.0)
Hemoglobin: 7.1 g/dL — ABNORMAL LOW (ref 13.0–17.0)
MCH: 24.7 pg — ABNORMAL LOW (ref 26.0–34.0)
MCHC: 29 g/dL — ABNORMAL LOW (ref 30.0–36.0)
MCV: 85.1 fL (ref 80.0–100.0)
Platelets: 466 10*3/uL — ABNORMAL HIGH (ref 150–400)
RBC: 2.88 MIL/uL — ABNORMAL LOW (ref 4.22–5.81)
RDW: 18.2 % — ABNORMAL HIGH (ref 11.5–15.5)
WBC: 12.5 10*3/uL — ABNORMAL HIGH (ref 4.0–10.5)
nRBC: 0 % (ref 0.0–0.2)

## 2020-09-27 LAB — GLUCOSE, CAPILLARY
Glucose-Capillary: 134 mg/dL — ABNORMAL HIGH (ref 70–99)
Glucose-Capillary: 108 mg/dL — ABNORMAL HIGH (ref 70–99)
Glucose-Capillary: 62 mg/dL — ABNORMAL LOW (ref 70–99)
Glucose-Capillary: 162 mg/dL — ABNORMAL HIGH (ref 70–99)

## 2020-09-27 LAB — SURGICAL PATHOLOGY

## 2020-09-27 NOTE — Progress Notes (Addendum)
PROGRESS NOTE    Patrick [name]  [id number] DOB: October 27, 1965 DOA: 09/24/2020 PCP: Beaulah Corin, [facility]   Brief Narrative: Patrick [name] is a 55 y.o. male with a hypertension, diabetes mellitus type 2, gout, CKD stage III, peripheral vascular disease s/p right BKA, left foot ulcer. Patient presented secondary to abdominal pain and found to have acute cholecystitis. General surgery consulted. Antibiotics started. Cholecystectomy performed on 8/7.   Assessment & Plan:   Principal Problem:   Cholelithiasis with acute cholecystitis Active Problems:   GOUT   HYPERTENSION, BENIGN ESSENTIAL   Diabetic foot ulcer (HCC)   CKD (chronic kidney disease) stage 3, GFR 30-59 ml/min (HCC)   Anemia   S/P BKA (below knee amputation) unilateral, right (HCC)   Hypertensive urgency   Constipation   Thrombocytosis   Acute cholecystitis Started on Ceftriaxone and Flagyl. General surgery consulted and performed laparoscopic cholecystectomy on 8/7. Cleared for discharge home from a general surgery standpoint. PT recommending SNF. Discontinue antibiotics.  Diabetic foot ulcer Recently admitted and treated with antibiotics. Was on Augmentin and doxycycline. Patient will follow-up with orthopedic surgery as an outpatient. X-ray suggesting possible concern for osteomyelitis but MRI recently without evidence of osteomyelitis. Wound is not currently infected. Discussed with radiology reading department who will add addendum to compare most recent x-ray with prior MRI. -Follow-up addendum to x-ray to see if repeat MRI is needed  Hypertensive urgency Patient is on losartan, hydrochlorothiazide and metoprolol as an outpatient. Home regimen of losartan and metoprolol resumed on admission. -Continue losartan 100 mg daily and metoprolol 12.5 mg BID  S/p right BKA Orthopedic surgery consulted for inpatient evaluation and consideration of staple removal. Patient needs to follow-up with Dr. Louanne Skye or Dr. Sharol Given in one  week  Chronic anemia Currently stable. No evidence of bleeding. -CBC daily, transfuse for hemoglobin <7  Constipation -Continue Miralax, Senokot-S  Diabetes mellitus, type 2 Diabetic neuropathy Patient is on Lantus and Novolog as an outpatient. Hemoglobin A1C of 7.8%. -Continue Lantus 20 units daily and Novolog 4 TID -Continue gabapentin  CKD stage IIIb Stable.  Thrombocytosis Mild. In setting of acute infection and likely reactive.   DVT prophylaxis: Lovenox Code Status:   Code Status: Full Code Family Communication: None Disposition Plan: Discharge to SNF when bed is available   Consultants:  General surgery  Procedures:  LAPAROSCOPIC CHOLECYSTECTOMY (09/26/2020)  Antimicrobials: Ceftriaxone IV Flagyl IV    Subjective: No concerns today  Objective: Vitals:   09/26/20 2012 09/27/20 0355 09/27/20 0817 09/27/20 1236  BP: (!) 148/100 (!) 137/95 (!) 145/93 (!) 144/99  Pulse: (!) 102 (!) 108 (!) 105 (!) 102  Resp: '16 17 17 18  '$ Temp: 98.1 F (36.7 C) 98.4 F (36.9 C) 98.4 F (36.9 C) 98.2 F (36.8 C)  TempSrc: Oral Oral Oral Oral  SpO2: 100% 97% 98% 99%  Weight:      Height:        Intake/Output Summary (Last 24 hours) at 09/27/2020 1315 Last data filed at 09/27/2020 1252 Gross per 24 hour  Intake 3410.01 ml  Output --  Net 3410.01 ml    Filed Weights   09/24/20 2336  Weight: 136.1 kg    Examination:  General exam: Appears calm and comfortable  Respiratory system: Clear to auscultation. Respiratory effort normal. Cardiovascular system: S1 & S2 heard, RRR. No murmurs. Gastrointestinal system: Abdomen is nondistended, soft and nontender. No organomegaly or masses felt. Normal bowel sounds heard. Central nervous system: Alert and oriented. No focal neurological deficits.  Musculoskeletal: Right BKA stump with staples and no wound dehiscence noted, no drainage noted, no erythema noted. Skin: No cyanosis. Psychiatry: Judgement and insight appear normal.  Mood & affect appropriate.     Data Reviewed: I have personally reviewed following labs and imaging studies  CBC Lab Results  Component Value Date   WBC 12.5 (H) 09/27/2020   RBC 2.88 (L) 09/27/2020   HGB 7.1 (L) 09/27/2020   HCT 24.5 (L) 09/27/2020   MCV 85.1 09/27/2020   MCH 24.7 (L) 09/27/2020   PLT 466 (H) 09/27/2020   MCHC 29.0 (L) 09/27/2020   RDW 18.2 (H) 09/27/2020   LYMPHSABS 3.1 09/24/2020   MONOABS 0.7 09/24/2020   EOSABS 0.2 09/24/2020   BASOSABS 0.1 0000000     Last metabolic panel Lab Results  Component Value Date   NA 137 09/26/2020   K 3.8 09/26/2020   CL 103 09/26/2020   CO2 26 09/26/2020   BUN 10 09/26/2020   CREATININE 1.12 09/26/2020   GLUCOSE 161 (H) 09/26/2020   GFRNONAA >60 09/26/2020   GFRAA 52 (L) 05/07/2019   CALCIUM 8.5 (L) 09/26/2020   PROT 6.1 (L) 09/26/2020   ALBUMIN 2.3 (L) 09/26/2020   BILITOT 0.6 09/26/2020   ALKPHOS 92 09/26/2020   AST 26 09/26/2020   ALT 16 09/26/2020   ANIONGAP 8 09/26/2020    CBG (last 3)  Recent Labs    09/26/20 2008 09/27/20 0814 09/27/20 1240  GLUCAP 149* 134* 108*      GFR: Estimated Creatinine Clearance: 114.9 mL/min (by C-G formula based on SCr of 1.12 mg/dL).  Coagulation Profile: No results for input(s): INR, PROTIME in the last 168 hours.  Recent Results (from the past 240 hour(s))  Resp Panel by RT-PCR (Flu A&B, Covid) Nasopharyngeal Swab     Status: None   Collection Time: 09/25/20  7:33 AM   Specimen: Nasopharyngeal Swab; Nasopharyngeal(NP) swabs in vial transport medium  Result Value Ref Range Status   SARS Coronavirus 2 by RT PCR NEGATIVE NEGATIVE Final    Comment: (NOTE) SARS-CoV-2 target nucleic acids are NOT DETECTED.  The SARS-CoV-2 RNA is generally detectable in upper respiratory specimens during the acute phase of infection. The lowest concentration of SARS-CoV-2 viral copies this assay can detect is 138 copies/mL. A negative result does not preclude  SARS-Cov-2 infection and should not be used as the sole basis for treatment or other patient management decisions. A negative result may occur with  improper specimen collection/handling, submission of specimen other than nasopharyngeal swab, presence of viral mutation(s) within the areas targeted by this assay, and inadequate number of viral copies(<138 copies/mL). A negative result must be combined with clinical observations, patient history, and epidemiological information. The expected result is Negative.  Fact Sheet for Patients:  EntrepreneurPulse.com.au  Fact Sheet for Healthcare Providers:  IncredibleEmployment.be  This test is no t yet approved or cleared by the [place] FDA and  has been authorized for detection and/or diagnosis of SARS-CoV-2 by FDA under an Emergency Use Authorization (EUA). This EUA will remain  in effect (meaning this test can be used) for the duration of the COVID-19 declaration under Section 564(b)(1) of the Act, 21 U.S.C.section 360bbb-3(b)(1), unless the authorization is terminated  or revoked sooner.       Influenza A by PCR NEGATIVE NEGATIVE Final   Influenza B by PCR NEGATIVE NEGATIVE Final    Comment: (NOTE) The Xpert Xpress SARS-CoV-2/FLU/RSV plus assay is intended as an aid in the diagnosis of influenza from  Nasopharyngeal swab specimens and should not be used as a sole basis for treatment. Nasal washings and aspirates are unacceptable for Xpert Xpress SARS-CoV-2/FLU/RSV testing.  Fact Sheet for Patients: EntrepreneurPulse.com.au  Fact Sheet for Healthcare Providers: IncredibleEmployment.be  This test is not yet approved or cleared by the [place] FDA and has been authorized for detection and/or diagnosis of SARS-CoV-2 by FDA under an Emergency Use Authorization (EUA). This EUA will remain in effect (meaning this test can be used) for the duration of  the COVID-19 declaration under Section 564(b)(1) of the Act, 21 U.S.C. section 360bbb-3(b)(1), unless the authorization is terminated or revoked.  Performed at [facility], [place] [address], [place], [place] [address]          Radiology Studies: No results found.      Scheduled Meds:  acetaminophen  1,000 mg Oral Q6H   allopurinol  100 mg Oral BID   ascorbic acid  1,000 mg Oral Daily   enoxaparin (LOVENOX) injection  70 mg Subcutaneous Q24H   gabapentin  300 mg Oral TID   insulin aspart  0-9 Units Subcutaneous TID WC   insulin aspart  4 Units Subcutaneous TID WC   insulin glargine  20 Units Subcutaneous BID   losartan  100 mg Oral Daily   metoprolol tartrate  12.5 mg Oral BID   senna-docusate  1 tablet Oral BID   zinc sulfate  220 mg Oral Daily   Continuous Infusions:  sodium chloride 100 mL/hr at 09/27/20 1252   cefTRIAXone (ROCEPHIN)  IV     cefTRIAXone (ROCEPHIN)  IV 2 g (09/27/20 1029)   metronidazole 500 mg (09/27/20 1128)     LOS: 2 days     Cordelia Poche, MD [facility] 09/27/2020, 1:15 PM  If 7PM-7AM, please contact night-coverage [email or web address]

## 2020-09-27 NOTE — Progress Notes (Signed)
Physical Therapy Treatment Patient Details Name: Patrick [name] MRN: [id number] DOB: [birth year] Today's Date: 09/27/2020    History of Present Illness Pt is a 55yo male admitted on 8/6 with complaints of right-sided flank pain secondary to acute cholescystis. S/p laparoscopic cholescystectomy on 8/7. Also complaining of left heel ulcer; xray suggestive of possible osteomyolitis. Of note: pt fell on 8/4 and was seen at [facility] ED. 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    PT Comments    Treatment limited due to recent pain medication and lethargy. Pt found with leg in dependent position while seated in chair. Educated on importance of edema management for wound healing. Assisted pt to don shrinker sock. Educated on safest transfer technique for back to bed--anterior transfer (pt currently does not want to try to transfer due to grogginess from pain meds and not ready to go back to bed). Pt could not recall technique from education on 8/8.    Follow Up Recommendations  SNF;Supervision for mobility/OOB     Equipment Recommendations  Rolling walker with 5" wheels;3in1 (PT)    Recommendations for Other Services       Precautions / Restrictions Precautions Precautions: Fall Precaution Comments: recent R BKA Restrictions Weight Bearing Restrictions: Yes RLE Weight Bearing: Non weight bearing    Mobility  Bed Mobility Overal bed mobility: Needs Assistance Bed Mobility: Supine to Sit     Supine to sit: Supervision;HOB elevated          Transfers Overall transfer level: Needs assistance Equipment used: None;Rolling walker (2 wheeled) Transfers: Sit to/from [facility] Sit to Stand: Min assist;Min guard Stand pivot transfers: Min guard       General transfer comment: pt up in recliner, states he used RW (in room) with nursing assist; states his walker is wider and obtained a bariatric RW for pt to use in his room; asked pt to describe the  anterior-posterior transfer he was taught yesterday and he could not recall (very groggy from pain meds). Reviewed this is the safest technique for return to bed with nursing. Pt did not want to actually transfer to bed or practice standing at this time due to grogginess from pain medication.  Ambulation/Gait                 Stairs             Wheelchair Mobility    Modified Rankin (Stroke Patients Only)       Balance Overall balance assessment: Needs assistance Sitting-balance support: Feet supported;No upper extremity supported Sitting balance-Leahy Scale: Good     Standing balance support: Bilateral upper extremity supported;During functional activity Standing balance-Leahy Scale: Poor                              Cognition Arousal/Alertness: Lethargic;Suspect due to medications Behavior During Therapy: [facility] for tasks assessed/performed Overall Cognitive Status: Within Functional Limits for tasks assessed                                        Exercises      General Comments General comments (skin integrity, edema, etc.): On arrival, pt with foot rest of recliner down with Rt BKA in dependent position. Educated on need for elevation to help manage edema and promote healing. Retrieved pt's shrinker sock from his  belongings and pt able to don. Advised he needs to wear this and keep RLE elevated as much as possible (can lower leg for meals or using urinal, but then return to elevated position).      Pertinent Vitals/Pain Pain Assessment: 0-10 Pain Score: 2  Pain Location: Stomach, left knee Pain Descriptors / Indicators: Operative site guarding;Sore;Guarding Pain Intervention(s): Premedicated before session;Monitored during session    [place] expects to be discharged to:: Unsure Living Arrangements: Non-relatives/Friends Available Help at Discharge: Friend(s);Available PRN/intermittently Type of Home:  Other(Comment) (extended stay hotel) Home Access: Level entry   Home Layout: One level Home Equipment: Wheelchair - manual;Crutches;Walker - 4 wheels Additional Comments: Pt is living at an extended stay hotel and reports that it is not handicap accessible and struggles with ADLs, mobility, and transfers.    Prior Function Level of Independence: Needs assistance  Gait / Transfers Assistance Needed: uses WC independently, but needs assist with car transfers. Also uses rollator to help with home mobility (such as WC to Bed transfer) ADL's / Homemaking Assistance Needed: Needs help with LB selfcare due to inaccessible environment; but pt reports that with additional training and appropriate setup feels he could be independent     PT Goals (current goals can now be found in the care plan section) Acute Rehab PT Goals Patient Stated Goal: to improve functional mobility Time For Goal Achievement: 10/10/20 Potential to Achieve Goals: Good Progress towards PT goals: Not progressing toward goals - comment (lethargy from pain meds precluded safe work on transfers)    Frequency    Min 3X/week      PT Plan Current plan remains appropriate    Co-evaluation              AM-PAC PT "6 Clicks" Mobility   Outcome Measure  Help needed turning from your back to your side while in a flat bed without using bedrails?: A Little Help needed moving from lying on your back to sitting on the side of a flat bed without using bedrails?: A Little Help needed moving to and from a bed to a chair (including a wheelchair)?: A Little Help needed standing up from a chair using your arms (e.g., wheelchair or bedside chair)?: A Lot Help needed to walk in hospital room?: Total Help needed climbing 3-5 steps with a railing? : Total 6 Click Score: 13    End of Session   Activity Tolerance: Patient limited by lethargy Patient left: in chair;with chair alarm set;with call bell/phone within reach   PT Visit  Diagnosis: Pain;Muscle weakness (generalized) (M62.81);History of falling (Z91.81);Other abnormalities of gait and mobility (R26.89) Pain - Right/Left: Left Pain - part of body: Knee     Time: [id number] PT Time Calculation (min) (ACUTE ONLY): 19 min  Charges:  $Self Care/Home Management: 8-22                      Arby Barrette, PT Pager [phone]    Rexanne Mano 09/27/2020, 4:21 PM

## 2020-09-27 NOTE — Evaluation (Signed)
Occupational Therapy Evaluation Patient Details Name: Patrick [name] MRN: [id number] DOB: 1965-04-07 Today's Date: 09/27/2020    History of Present Illness Pt is a 55yo male admitted on 8/6 with complaints of right-sided flank pain secondary to acute cholescystis. S/p laparoscopic cholescystectomy on 8/7. Also complaining of left heel ulcer; xray suggestive of possible osteomyolitis. Of note: pt fell on 8/4 and was seen at [facility] ED. 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   Clinical Impression   Pt presents with decline in function and safety with ADLs and ADL mobility with impaired balance and endurance. Pt recently had stay at [facility]. PTA, pt living at an extended stay hotel and reports that it is not handicap accessible and struggles with LB ADLs, mobility, and transfers. Pt currently requires mod A with LB ADLs, min A - min guard A with mobility using RW. Pt would benefit from acute OT services to address impairments to maximize level of function and safety    Follow Up Recommendations  SNF    Equipment Recommendations  Tub/shower seat;3 in 1 bedside commode;Other (comment) (RW, TBD at next level of care)    Recommendations for Other Services       Precautions / Restrictions Precautions Precautions: Fall Precaution Comments: recent R BKA Restrictions Weight Bearing Restrictions: Yes RLE Weight Bearing: Non weight bearing      Mobility Bed Mobility Overal bed mobility: Needs Assistance Bed Mobility: Supine to Sit     Supine to sit: Supervision;HOB elevated          Transfers Overall transfer level: Needs assistance Equipment used: None;Rolling walker (2 wheeled) Transfers: Sit to/from [facility] Sit to Stand: Min assist;Min guard Stand pivot transfers: Min guard            Balance Overall balance assessment: Needs assistance Sitting-balance support: Feet supported;No upper extremity supported Sitting balance-Leahy Scale: Good      Standing balance support: Bilateral upper extremity supported;During functional activity Standing balance-Leahy Scale: Poor                             ADL either performed or assessed with clinical judgement   ADL Overall ADL's : Needs assistance/impaired Eating/Feeding: Independent;Sitting   Grooming: Wash/dry hands;Wash/dry face;Set up;Supervision/safety;Sitting   Upper Body Bathing: Set up;Sitting   Lower Body Bathing: Moderate assistance;Sitting/lateral leans   Upper Body Dressing : Set up;Sitting   Lower Body Dressing: Moderate assistance;Sitting/lateral leans   Toilet Transfer: Minimal assistance;Min guard;RW;Stand-pivot Toilet Transfer Details (indicate cue type and reason): simulated to recliner Toileting- Clothing Manipulation and Hygiene: Moderate assistance       Functional mobility during ADLs: Minimal assistance;Min guard;Rolling walker;Cueing for safety General ADL Comments: pt recently d/c from [place] Vision/History: Wears glasses Wears Glasses: Reading only Patient Visual Report: No change from baseline       Perception     Praxis      Pertinent Vitals/Pain Pain Assessment: 0-10 Pain Score: 5  Pain Location: Stomach, left knee Pain Descriptors / Indicators: Operative site guarding;Sore;Guarding Pain Intervention(s): Monitored during session;Repositioned     Hand Dominance Right   Extremity/Trunk Assessment Upper Extremity Assessment Upper Extremity Assessment: Overall WFL for tasks assessed   Lower Extremity Assessment Lower Extremity Assessment: Defer to PT evaluation   Cervical / Trunk Assessment Cervical / Trunk Assessment: Normal   Communication Communication Communication: No difficulties   Cognition Arousal/Alertness: Awake/alert Behavior During Therapy:  WFL for tasks assessed/performed Overall Cognitive Status: Within Functional Limits for tasks assessed                                      General Comments       Exercises     Shoulder Instructions      Home Living Family/patient expects to be discharged to:: Unsure Living Arrangements: Non-relatives/Friends Available Help at Discharge: Friend(s);Available PRN/intermittently Type of Home: Other(Comment) (extended stay hotel) Home Access: Level entry     Home Layout: One level     Bathroom Shower/Tub: Teacher, early years/pre: Standard Bathroom Accessibility: No   Home Equipment: Wheelchair - Water quality scientist - 4 wheels   Additional Comments: Pt is living at an extended stay hotel and reports that it is not handicap accessible and struggles with ADLs, mobility, and transfers.  Lives With: Friend(s)    Prior Functioning/Environment Level of Independence: Needs assistance  Gait / Transfers Assistance Needed: uses WC independently, but needs assist with car transfers. Also uses rollator to help with home mobility (such as WC to Bed transfer) ADL's / Homemaking Assistance Needed: Needs help with LB selfcare due to inaccessible environment; but pt reports that with additional training and appropriate setup feels he could be independent            OT Problem List: Impaired balance (sitting and/or standing);Pain;Decreased activity tolerance;Decreased knowledge of use of DME or AE      OT Treatment/Interventions: Self-care/ADL training;Patient/family education;Balance training;Therapeutic activities;DME and/or AE instruction    OT Goals(Current goals can be found in the care plan section) Acute Rehab OT Goals Patient Stated Goal: to improve functional mobility OT Goal Formulation: With patient Time For Goal Achievement: 10/11/20 Potential to Achieve Goals: Good ADL Goals Pt Will Perform Grooming: with min guard assist;with supervision;standing Pt Will Perform Lower Body Bathing: with min assist;sitting/lateral leans Pt Will Perform Lower Body Dressing: with min  assist;sitting/lateral leans Pt Will Transfer to Toilet: with min guard assist;with supervision;ambulating;stand pivot transfer Pt Will Perform Toileting - Clothing Manipulation and hygiene: with min assist;with min guard assist;sit to/from stand Pt Will Perform Tub/Shower Transfer: with min guard assist;ambulating;Stand pivot transfer;rolling walker  OT Frequency: Min 2X/week   Barriers to D/C:            Co-evaluation              AM-PAC OT "6 Clicks" Daily Activity     Outcome Measure Help from another person eating meals?: None Help from another person taking care of personal grooming?: A Little Help from another person toileting, which includes using toliet, bedpan, or urinal?: A Lot Help from another person bathing (including washing, rinsing, drying)?: A Lot Help from another person to put on and taking off regular upper body clothing?: None Help from another person to put on and taking off regular lower body clothing?: A Lot 6 Click Score: 17   End of Session Equipment Utilized During Treatment: Gait belt;Rolling walker  Activity Tolerance: Patient tolerated treatment well Patient left: in chair;with call bell/phone within reach  OT Visit Diagnosis: Unsteadiness on feet (R26.81);Other abnormalities of gait and mobility (R26.89);History of falling (Z91.81);Pain Pain - Right/Left: Left Pain - part of body: Knee                Time: [id number] OT Time Calculation (min): 23 min Charges:  OT General Charges $OT Visit: 1 Visit OT  Evaluation $OT Eval Moderate Complexity: 1 Mod OT Treatments $Self Care/Home Management : 8-22 mins   Britt Bottom 09/27/2020, 12:46 PM

## 2020-09-27 NOTE — Progress Notes (Signed)
Patient ID: Patrick [name], male   DOB: Jun 23, 1965, 55 y.o.   MRN: [id number]   LOS: 2 days   Subjective: Asked to see pt who has been having trouble getting into office. He is 1 month s/p right BKA and has developed a new left heel ulcer in the past week.   Objective: Vital signs in last 24 hours: Temp:  [97.6 F (36.4 C)-98.4 F (36.9 C)] 98.4 F (36.9 C) (08/09 0817) Pulse Rate:  [102-108] 105 (08/09 0817) Resp:  [16-18] 17 (08/09 0817) BP: (137-148)/(79-100) 145/93 (08/09 0817) SpO2:  [97 %-100 %] 98 % (08/09 0817) Last BM Date: 09/24/20   Laboratory  CBC Recent Labs    09/26/20 0022 09/27/20 0022  WBC 16.7* 12.5*  HGB 7.1* 7.1*  HCT 22.9* 24.5*  PLT 507* 466*   BMET Recent Labs    09/24/20 2351 09/26/20 0022  NA 138 137  K 3.8 3.8  CL 105 103  CO2 26 26  GLUCOSE 196* 161*  BUN 13 10  CREATININE 1.28* 1.12  CALCIUM 8.9 8.5*     Physical Exam General appearance: alert and no distress RLE: Incision C/D/I LLE: Superficial appearing posterior heel ulceration, no odor. 0 DP/PT.   Assessment/Plan: S/p right BKA -- Will removed staples. Continue to use stump shrinker and f/u with Dr. Louanne Skye or Sharol Given next week. Left heel ulcer -- Given x-ray findings will need MRI. Vascular studies are WNL.   Lisette Abu, PA-C Orthopedic Surgery [phone] 09/27/2020

## 2020-09-27 NOTE — Progress Notes (Signed)
Progress Note  2 Days Post-Op  Subjective: Still a little bloated. Passing flatus. No BM. Worked with therapies yesterday  Objective: Vital signs in last 24 hours: Temp:  [97.6 F (36.4 C)-98.9 F (37.2 C)] 98.4 F (36.9 C) (08/09 0355) Pulse Rate:  [100-108] 108 (08/09 0355) Resp:  [16-18] 17 (08/09 0355) BP: (137-156)/(79-108) 137/95 (08/09 0355) SpO2:  [97 %-100 %] 97 % (08/09 0355) Last BM Date: 09/24/20  Intake/Output from previous day: 08/08 0701 - 08/09 0700 In: 2525 [P.O.:480; I.V.:1645; IV Piggyback:400] Out: -  Intake/Output this shift: No intake/output data recorded.  PE: General: pleasant, WD, male who is laying in bed in NAD HEENT: head is normocephalic, atraumatic. Mouth is pink and moist Heart: regular, rate, and rhythm.  Palpable radial pulses Lungs: CTAB, no wheezes, rhonchi, or rales noted.  Respiratory effort nonlabored Abd: soft, +BS, protuberant. Incisiona with glue intact - no erythema or discharge. Mild expected  post operative TTP MSK: LLE edematous, RLE BKA Skin: warm and dry with no masses, lesions, or rashes Psych: A&Ox3 with an appropriate affect.    Lab Results:  Recent Labs    09/26/20 0022 09/27/20 0022  WBC 16.7* 12.5*  HGB 7.1* 7.1*  HCT 22.9* 24.5*  PLT 507* 466*    BMET Recent Labs    09/24/20 2351 09/26/20 0022  NA 138 137  K 3.8 3.8  CL 105 103  CO2 26 26  GLUCOSE 196* 161*  BUN 13 10  CREATININE 1.28* 1.12  CALCIUM 8.9 8.5*    PT/INR No results for input(s): LABPROT, INR in the last 72 hours. CMP     Component Value Date/Time   NA 137 09/26/2020 0022   K 3.8 09/26/2020 0022   CL 103 09/26/2020 0022   CO2 26 09/26/2020 0022   GLUCOSE 161 (H) 09/26/2020 0022   BUN 10 09/26/2020 0022   CREATININE 1.12 09/26/2020 0022   CALCIUM 8.5 (L) 09/26/2020 0022   PROT 6.1 (L) 09/26/2020 0022   ALBUMIN 2.3 (L) 09/26/2020 0022   AST 26 09/26/2020 0022   ALT 16 09/26/2020 0022   ALKPHOS 92 09/26/2020 0022    BILITOT 0.6 09/26/2020 0022   GFRNONAA >60 09/26/2020 0022   GFRAA 52 (L) 05/07/2019 0641   Lipase     Component Value Date/Time   LIPASE 34 09/24/2020 2351       Studies/Results: DG Foot 2 Views Left  Result Date: 09/25/2020 CLINICAL DATA:  55 year old male with history of swelling in the left foot and ulcer overlying the left heel. Evaluate for osteomyelitis. EXAM: LEFT FOOT - 2 VIEW COMPARISON:  Left foot radiograph 09/17/2020. FINDINGS: Two views of the left foot demonstrate irregular areas of sclerosis and lucency in the calcaneus. Small plantar calcaneal spur. No acute displaced fracture or dislocation. Multifocal joint space narrowing, subchondral sclerosis, subchondral cyst formation and osteophyte formation is noted in the midfoot and at the first MTP joint, indicative of osteoarthritis. Pes planus. IMPRESSION: 1. In addition to chronic degenerative changes in the foot, there are irregular areas of sclerosis and lucency in the calcaneus. Given the patient's history of heel ulcer, the possibility of chronic calcaneal osteomyelitis warrants consideration and further evaluation with left foot MRI with and without IV gadolinium is recommended to better evaluate this finding. Electronically Signed   By: Vinnie Langton M.D.   On: 09/25/2020 08:49    Anti-infectives: Anti-infectives (From admission, onward)    Start     Dose/Rate Route Frequency Ordered Stop  09/26/20 1000  cefTRIAXone (ROCEPHIN) 2 g in sodium chloride 0.9 % 100 mL IVPB        2 g 200 mL/hr over 30 Minutes Intravenous Every 24 hours 09/25/20 1057     09/25/20 1100  metroNIDAZOLE (FLAGYL) IVPB 500 mg        500 mg 100 mL/hr over 60 Minutes Intravenous Every 8 hours 09/25/20 1054     09/25/20 1100  cefTRIAXone (ROCEPHIN) 1 g in sodium chloride 0.9 % 100 mL IVPB        1 g 200 mL/hr over 30 Minutes Intravenous  Once 09/25/20 1057     09/25/20 0815  cefTRIAXone (ROCEPHIN) 1 g in sodium chloride 0.9 % 100 mL IVPB         1 g 200 mL/hr over 30 Minutes Intravenous  Once 09/25/20 0808 09/25/20 0902        Assessment/Plan  Cholelithiasis with acute calculous cholecystitis - POD2 s/p lap chole 8/7 - Dr. Bobbye Morton - WBC 12.5 (16.7)  - Hgb 7.1 in setting of chronic anemia - multimodal pain control - scheduled tylenol, prn robaxin - mobilize with PT/OT  FEN: carb mod ID: ceftriaxone/flagyl > 8/8 VTE: SCDs, lovenox  HTN T2DM Constipation Gout Anemia CKD S/p BKA  Dispo: stable for discharge to SNF from surgical perspective. We will sign off. Please reach out for any further needs    LOS: 2 days    Winferd Humphrey, [facility] 09/27/2020, 7:40 AM Please see Amion for pager number during day hours 7:00am-4:30pm

## 2020-09-28 LAB — CBC
HCT: 25.4 % — ABNORMAL LOW (ref 39.0–52.0)
Hemoglobin: 7.6 g/dL — ABNORMAL LOW (ref 13.0–17.0)
MCH: 25.2 pg — ABNORMAL LOW (ref 26.0–34.0)
MCHC: 29.9 g/dL — ABNORMAL LOW (ref 30.0–36.0)
MCV: 84.1 fL (ref 80.0–100.0)
Platelets: 585 10*3/uL — ABNORMAL HIGH (ref 150–400)
RBC: 3.02 MIL/uL — ABNORMAL LOW (ref 4.22–5.81)
RDW: 18.2 % — ABNORMAL HIGH (ref 11.5–15.5)
WBC: 12.4 10*3/uL — ABNORMAL HIGH (ref 4.0–10.5)
nRBC: 0 % (ref 0.0–0.2)

## 2020-09-28 LAB — BASIC METABOLIC PANEL
Anion gap: 10 (ref 5–15)
BUN: 13 mg/dL (ref 6–20)
CO2: 25 mmol/L (ref 22–32)
Calcium: 8.5 mg/dL — ABNORMAL LOW (ref 8.9–10.3)
Chloride: 105 mmol/L (ref 98–111)
Creatinine, Ser: 1.29 mg/dL — ABNORMAL HIGH (ref 0.61–1.24)
GFR, Estimated: >60 mL/min (ref >60)
Glucose, Bld: 144 mg/dL — ABNORMAL HIGH (ref 70–99)
Potassium: 3.9 mmol/L (ref 3.5–5.1)
Sodium: 140 mmol/L (ref 135–145)

## 2020-09-28 LAB — GLUCOSE, CAPILLARY
Glucose-Capillary: 131 mg/dL — ABNORMAL HIGH (ref 70–99)
Glucose-Capillary: 87 mg/dL (ref 70–99)
Glucose-Capillary: 145 mg/dL — ABNORMAL HIGH (ref 70–99)
Glucose-Capillary: 121 mg/dL — ABNORMAL HIGH (ref 70–99)
Glucose-Capillary: 113 mg/dL — ABNORMAL HIGH (ref 70–99)

## 2020-09-28 MED ORDER — FUROSEMIDE 10 MG/ML IJ SOLN
20.0000 mg | Freq: Once | INTRAMUSCULAR | Status: AC
  Administered 2020-09-28: 20 mg via INTRAVENOUS
  Filled 2020-09-28: qty 2

## 2020-09-28 NOTE — TOC Initial Note (Signed)
Transition of Care [facility]) - Initial/Assessment Note    Patient Details  Name: Patrick [name] MRN: [id number] Date of Birth: December 10, 1965  Transition of Care [facility]) CM/SW Contact:    Marilu Favre, RN Phone Number: 09/28/2020, 2:04 PM  Clinical Narrative:                 Prior to admission patient was living at Extended [address], [place], [place], [place] [address]   Patient lives by himself , but has friends to check on him.   Patient's PCP at [facility].   Patient uninsured.   Referral for charity home health given to [facility] with [facility] , he will review clinicals etc and make determination. Patient aware.   Patient aware even if he does quality for a home health RN , [facility] will not be able to visit daily to change his dressing. Bedside nurse at hospital will teach him wound care prior to discharge. Patient states he has a friend who can assist with wound care post discharge. NCM will ask nurse to provide teaching to patient and friend and also a few days of dressing supplies. If patient does not qualify for charity home health, NCM will order dressing supplies with [facility].   Patient has wheel chair, 3 in1 and walker already. He asked for shower chair. NCM called Freda Munro with [facility]. They do not provide shower chairs for charity , he would need to pay. Shower chair with no back is $35.00 , with a back $45.00. Patient aware, his debit card is at hotel , he will order one later.   Patient requesting PTAR transport at discharge.  Expected Discharge Plan: [place]     Patient Goals and CMS Choice     Choice offered to / list presented to : Patient  Expected Discharge Plan and Services Expected Discharge Plan: [place] In-house Referral: Financial Counselor Discharge Planning Services: CM Consult Post Acute Care Choice: [place] arrangements for the past 2 months: Hotel/Motel                   DME Agency: NA        HH Arranged: PT, RN [place] Agency: [place] Date [place]: 09/28/20 Time [place]: 33 Representative spoke with at [place]: Tommi Rumps reviewing cliniclas , will make determination  Prior Living Arrangements/Services Living arrangements for the past 2 months: Hotel/Motel Lives with:: Self Patient language and need for interpreter reviewed:: Yes Do you feel safe going back to the place where you live?: Yes      Need for Family Participation in Patient Care:  (has friends to check on him)   Current home services: DME Criminal Activity/Legal Involvement Pertinent to Current Situation/Hospitalization: No - Comment as needed  Activities of Daily Living      Permission Sought/Granted   Permission granted to share information with : Yes, Verbal Permission Granted     Permission granted to share info w AGENCY: [facility]mings and [facility]        Emotional Assessment Appearance:: Appears stated age Attitude/Demeanor/Rapport: Engaged Affect (typically observed): Accepting Orientation: : Oriented to Self, Oriented to Place, Oriented to  Time, Oriented to Situation Alcohol / Substance Use: Not Applicable Psych Involvement: No (comment)  Admission diagnosis:  Acute cholecystitis [K81.0] Cholelithiasis with acute cholecystitis [K80.00] Patient Active Problem List   Diagnosis Date Noted   Cholelithiasis with acute cholecystitis 09/25/2020  Hypertensive urgency 09/25/2020   Constipation 09/25/2020   Thrombocytosis 09/25/2020   S/P BKA (below knee amputation) unilateral, right ([place]) 09/01/2020   ABLA (acute blood loss anemia) 09/01/2020   Anemia 08/27/2020   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 ([place]) 07/20/2020   Dehiscence of amputation stump ([place]) 07/20/2020   Cutaneous abscess of right foot    Osteomyelitis of right foot (H[facility])    Severe protein-calorie malnutrition ([place])    Sepsis due to cellulitis ([place]) 06/04/2020    Diabetic foot ulcer ([place]) 06/04/2020   Diabetic foot infection ([place]) 06/04/2020   CKD (chronic kidney disease) stage 3, GFR 30-59 ml/min ([place]) 06/04/2020   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[place]) 06/04/2020   Acute respiratory disease due to COVID-19 virus 05/03/2019   Renal insufficiency 05/03/2019   GOUT 01/27/2010   OBESITY 01/27/2010   Insulin-requiring or dependent type II diabetes mellitus ([place]) 02/20/2008   HYPERTENSION, BENIGN ESSENTIAL 02/20/2008   PCP:  [facility]:   [place] ([place]), [place] - [address] [place]) [place] [address] Phone: [phone] Fax: [place] [address] [place] [address] Phone: [phone] Fax: [phone]     Social Determinants of Health (SDOH) Interventions    Readmission Risk Interventions Readmission Risk Prevention Plan 08/29/2020  Transportation Screening Complete  PCP or Specialist Appt within 3-5 Days Complete  HRI or [place] Complete  Social Work Consult for [place] Planning/Counseling Complete  Palliative Care Screening Not Applicable  Medication Review Press photographer) Complete  Some recent data might be hidden

## 2020-09-28 NOTE — Progress Notes (Signed)
[facility] Progress Note  Patient: Patrick [name]    [id number]  DOA: 09/24/2020     Date of Service: the patient was seen and examined on 09/28/2020  Brief hospital course: Patrick [name] is a 55 y.o. male with a hypertension, diabetes mellitus type 2, gout, CKD stage III, peripheral vascular disease s/p right BKA, left foot ulcer. Patient presented secondary to abdominal pain and found to have acute cholecystitis. General surgery consulted. Antibiotics started. Cholecystectomy performed on 8/7.  Currently plan is monitor renal function and volume overload.  Subjective: Denies any acute complaint.  No nausea no vomiting.  No fever no chills.  Has swelling in the leg which he says is improving.  Assessment and Plan: Acute cholecystitis Started on Ceftriaxone and Flagyl.  General surgery consulted and performed laparoscopic cholecystectomy on 8/7.  Cleared for discharge home from a general surgery standpoint.  PT recommending SNF.  Unable to arrange SNF.  Patient will go home tomorrow as long as medically stable. Discontinue antibiotics.   Diabetic foot ulcer Recently admitted and treated with antibiotics. Was on Augmentin and doxycycline. Patient will follow-up with orthopedic surgery as an outpatient. X-ray suggesting possible concern for osteomyelitis but MRI recently without evidence of osteomyelitis. Wound is not currently infected. Comparison is made with prior left foot MRI dated 09/18/2020. This MRI demonstrated no evidence of calcaneal osteomyelitis. The areas of sclerosis and lucency in the calcaneus are therefore unrelated to acute osteomyelitis.   Hypertensive urgency Patient is on losartan, hydrochlorothiazide and metoprolol as an outpatient. Home regimen of losartan and metoprolol resumed on admission. -Continue losartan 100 mg daily and metoprolol 12.5 mg BID   S/p right BKA Orthopedic surgery consulted for inpatient evaluation and consideration of staple removal.  Patient needs to follow-up with Dr. Louanne Skye or Dr. Sharol Given in one week   Chronic anemia Currently stable. No evidence of bleeding. -CBC daily, transfuse for hemoglobin <7   Constipation -Continue Miralax, Senokot-S   Diabetes mellitus, type 2 Diabetic neuropathy Patient is on Lantus and Novolog as an outpatient. Hemoglobin A1C of 7.8%. -Continue Lantus 20 units daily and Novolog 4 TID -Continue gabapentin   CKD stage IIIb Stable.   Thrombocytosis Mild. In setting of acute infection and likely reactive.  Left leg edema. Mildly volume overloaded overall. Will provide IV Lasix and monitor renal function.  Left heel ulcer. Discussed with orthopedics.  No indication for MRI.  Scheduled Meds:  acetaminophen  1,000 mg Oral Q6H   allopurinol  100 mg Oral BID   ascorbic acid  1,000 mg Oral Daily   enoxaparin (LOVENOX) injection  70 mg Subcutaneous Q24H   gabapentin  300 mg Oral TID   insulin aspart  0-9 Units Subcutaneous TID WC   insulin aspart  4 Units Subcutaneous TID WC   insulin glargine  20 Units Subcutaneous BID   losartan  100 mg Oral Daily   metoprolol tartrate  12.5 mg Oral BID   senna-docusate  1 tablet Oral BID   zinc sulfate  220 mg Oral Daily   Continuous Infusions: PRN Meds: albuterol, hydrALAZINE, methocarbamol, morphine injection, ondansetron **OR** ondansetron (ZOFRAN) IV, oxyCODONE, polyethylene glycol  Body mass index is 36.52 kg/m.        DVT Prophylaxis: Subcutaneous Lovenox      Advance goals of care discussion: Pt is Full code.  Family Communication: no family was present at bedside, at the time of interview.   Data Reviewed: I have personally reviewed and interpreted daily labs, tele strips,  imaging. Serum creatinine 1.29, electrolytes stable.  WBC stable.  Hemoglobin 7.6.  Physical Exam:  General: Appear in mild distress, no Rash; Oral Mucosa Clear, moist. no Abnormal Neck Mass Or lumps, Conjunctiva normal  Cardiovascular: S1 and S2  Present, no Murmur, Respiratory: good respiratory effort, Bilateral Air entry present and CTA, no Crackles, no wheezes Abdomen: Bowel Sound present, Soft and no tenderness Extremities: Left leg pedal edema, right BKA Neurology: alert and oriented to time, place, and person affect appropriate. no new focal deficit Gait not checked due to patient safety concerns   Vitals:   09/27/20 1236 09/27/20 2148 09/28/20 0611 09/28/20 0759  BP: (!) 144/99 (!) 158/99 (!) (P) 152/96 (!) 141/98  Pulse: (!) 102 (!) 104 (!) (P) 108 (!) 105  Resp: 18 18 (P) 18 18  Temp: 98.2 F (36.8 C) 98.4 F (36.9 C)  98.4 F (36.9 C)  TempSrc: Oral Oral (P) Oral Oral  SpO2: 99% 99% (P) 98% 99%  Weight:      Height:        Disposition:  Status is: Inpatient  Remains inpatient appropriate because:Inpatient level of care appropriate due to severity of illness  Dispo: The patient is from: Home              Anticipated d/c is to: Home              Patient currently is not medically stable to d/c.   Difficult to place patient No  Time spent: 35 minutes. I reviewed all nursing notes, pharmacy notes, vitals, pertinent old records. I have discussed plan of care as described above with RN.  Author: Berle Mull, MD [facility] 09/28/2020 6:26 PM  To reach On-call, see care teams to locate the attending and reach out via [email or web address]. Between 7PM-7AM, please contact night-coverage If you still have difficulty reaching the attending provider, please page the [facility] (Director on Call) for [facility] on amion for assistance.

## 2020-09-28 NOTE — Progress Notes (Signed)
Inpatient Diabetes Program Recommendations  AACE/ADA: New Consensus Statement on Inpatient Glycemic Control (2015)  Target Ranges:  Prepandial:   less than 140 mg/dL      Peak postprandial:   less than 180 mg/dL (1-2 hours)      Critically ill patients:  140 - 180 mg/dL   Lab Results  Component Value Date   GLUCAP 131 (H) 09/28/2020   HGBA1C 7.8 (H) 08/27/2020    Review of Glycemic Control  Results for JULIO, STEPHANI (MRN [id number]) as of 09/28/2020 11:21  Ref. Range 09/27/2020 08:14 09/27/2020 12:40 09/27/2020 16:38 09/27/2020 21:45 09/28/2020 07:58  Glucose-Capillary Latest Ref Range: 70 - 99 mg/dL 134 (H) Novolog 3 units 108 (H) Novolog 4 units 62 (L) 162 (H) 131 (H)   Inpatient Diabetes Program Recommendations:   CBG 62 postprandial 8/9. -Decrease Novolog 3 units tid  Secure chat to Dr. Posey Pronto.  Thank you, Nani Gasser. Charlis Harner, RN, MSN, CDE  Diabetes Coordinator Inpatient Glycemic Control Team Team Pager [phone] (8am-5pm) 09/28/2020 11:24 AM

## 2020-09-28 NOTE — Progress Notes (Signed)
Held Novolog 4 units, pt BS 87 at 1248. Notify Dr. Posey Pronto. Pt only order peanut butter and jelly sandwich for lunch. Will continue to monitor.

## 2020-09-29 ENCOUNTER — Other Ambulatory Visit (HOSPITAL_COMMUNITY): Payer: Self-pay

## 2020-09-29 LAB — BASIC METABOLIC PANEL
Anion gap: 10 (ref 5–15)
BUN: 14 mg/dL (ref 6–20)
CO2: 23 mmol/L (ref 22–32)
Calcium: 8.6 mg/dL — ABNORMAL LOW (ref 8.9–10.3)
Chloride: 106 mmol/L (ref 98–111)
Creatinine, Ser: 1.29 mg/dL — ABNORMAL HIGH (ref 0.61–1.24)
GFR, Estimated: >60 mL/min (ref >60)
Glucose, Bld: 141 mg/dL — ABNORMAL HIGH (ref 70–99)
Potassium: 4.1 mmol/L (ref 3.5–5.1)
Sodium: 139 mmol/L (ref 135–145)

## 2020-09-29 LAB — GLUCOSE, CAPILLARY
Glucose-Capillary: 123 mg/dL — ABNORMAL HIGH (ref 70–99)
Glucose-Capillary: 117 mg/dL — ABNORMAL HIGH (ref 70–99)
Glucose-Capillary: 102 mg/dL — ABNORMAL HIGH (ref 70–99)

## 2020-09-29 LAB — MAGNESIUM: Magnesium: 1.3 mg/dL — ABNORMAL LOW (ref 1.7–2.4)

## 2020-09-29 MED ORDER — TIZANIDINE HCL 4 MG PO TABS
4.0000 mg | ORAL_TABLET | Freq: Two times a day (BID) | ORAL | 0 refills | Status: DC | PRN
  Filled 2020-09-29: qty 10, 5d supply, fill #0

## 2020-09-29 MED ORDER — CERTAVITE/ANTIOXIDANTS PO TABS
1.0000 | ORAL_TABLET | Freq: Every day | ORAL | 0 refills | Status: DC
  Filled 2020-09-29: qty 30, 30d supply, fill #0

## 2020-09-29 MED ORDER — POLYETHYLENE GLYCOL 3350 17 GM/SCOOP PO POWD
17.0000 g | Freq: Every day | ORAL | 0 refills | Status: DC | PRN
  Filled 2020-09-29: qty 510, 30d supply, fill #0

## 2020-09-29 MED ORDER — FUROSEMIDE 20 MG PO TABS
20.0000 mg | ORAL_TABLET | Freq: Every day | ORAL | 0 refills | Status: DC | PRN
  Filled 2020-09-29: qty 30, 30d supply, fill #0

## 2020-09-29 MED ORDER — MAGNESIUM SULFATE 2 GM/50ML IV SOLN
2.0000 g | Freq: Once | INTRAVENOUS | Status: AC
  Administered 2020-09-29: 2 g via INTRAVENOUS
  Filled 2020-09-29: qty 50

## 2020-09-29 MED ORDER — DOCUSATE SODIUM 100 MG PO CAPS
100.0000 mg | ORAL_CAPSULE | Freq: Two times a day (BID) | ORAL | 0 refills | Status: DC
  Filled 2020-09-29: qty 15, 8d supply, fill #0

## 2020-09-29 MED ORDER — TRAMADOL-ACETAMINOPHEN 37.5-325 MG PO TABS
1.0000 | ORAL_TABLET | Freq: Four times a day (QID) | ORAL | 0 refills | Status: DC | PRN
  Filled 2020-09-29: qty 20, 5d supply, fill #0

## 2020-09-29 NOTE — TOC Progression Note (Addendum)
Transition of Care [facility]) - Progression Note    Patient Details  Name: Patrick [name] MRN: [id number] Date of Birth: 03-11-1965  Transition [facility]) CM/SW Contact  Murlene Revell, Edson Snowball, RN Phone Number: 09/29/2020, 8:29 AM  Clinical Narrative:     Tommi Rumps with [facility] has accepted referral for home health RN,PT,OT .   Spoke to patient regarding applying for Medicaid , information provided.   Asked nurse in progression to provide wound education. Nurse provided education yesterday.   Will ask nurse to provide some dressing changes at discharge.   Patient requesting PTAR transportation home. [facility] [place] , [place], [place] , [place], [place] [address]  [facility] did bring shower chair to room. Patient aware PTAR will not transport DME, he will ask a friend to pick up shower chair.   [place] for transportation home . Cost is $648.08. Patient aware and is calling to see if one of his friends can bring his wheel chair to hospital and transport him home. Will follow up with patient shortly.  1630 Patient has a ride home at 6pm Expected Discharge Plan: [place]    Expected Discharge Plan and Services Expected Discharge Plan: [place] In-house Referral: Financial Counselor Discharge Planning Services: CM Consult Post Acute Care Choice: [place] arrangements for the past 2 months: Hotel/Motel                   DME Agency: NA       HH Arranged: PT, RN [place] Agency: [place] Date [place]: 09/28/20 Time [place]: 56 Representative spoke with at [place]: Tommi Rumps reviewing cliniclas , will make determination   Social Determinants of Health (SDOH) Interventions    Readmission Risk Interventions Readmission Risk Prevention Plan 08/29/2020  Transportation Screening Complete  PCP or Specialist Appt within 3-5 Days Complete  HRI or [place] Complete  Social Work Consult for  [place] Planning/Counseling Complete  Palliative Care Screening Not Applicable  Medication Review Press photographer) Complete  Some recent data might be hidden

## 2020-09-29 NOTE — Discharge Summary (Signed)
[facility] Discharge Summary   Patient: Patrick [name] [id number]  PCP: [place]  Date of admission: 09/24/2020   Date of discharge:  09/29/2020     Discharge Diagnoses:  Principal Problem:   Cholelithiasis with acute cholecystitis Active Problems:   GOUT   HYPERTENSION, BENIGN ESSENTIAL   Diabetic foot ulcer ([place])   CKD (chronic kidney disease) stage 3, GFR 30-59 ml/min (HCC)   Anemia   S/P BKA (below knee amputation) unilateral, right (HCC)   Hypertensive urgency   Constipation   Thrombocytosis   Admitted From: home Disposition:  Home   Recommendations for Outpatient Follow-up:  PCP: Follow-up with PCP in 1 week.  Follow-up with surgery as recommended. Follow-up with orthopedic as recommended as well.   Follow-up Information     Sur[facility], [place] Follow up on 10/11/2020.   Specialty: General Surgery Why: surgical follow up at 10:15am, arrive by 9:45am for paperwork and check in process Contact information: [place] [address] [phone]         [facility], [facility] Follow up.   Specialty: Home Health Services Contact information: [place] [address] [phone]         [name], MD. Schedule an appointment as soon as possible for a visit in 2 week(s).   Specialty: Orthopedic Surgery Contact information: [place] [address] [phone]                Discharge Instructions     Diet Carb Modified   Complete by: As directed    Discharge instructions   Complete by: As directed    It is important that you read the instructions as well as go over your medication list with RN to help you understand your care after this hospitalization.  Please follow-up with PCP in 1-2 weeks.  Please note that we are unable to authorize any refills for discharge medications, once you are discharged. Thus, it is imperative that you return to your primary  care physician (or establish a relationship with a primary care physician if you do not have one) for your care needs. So that they can reassess your need for medications and monitor your lab values.  Please request your primary care physician to go over all Hospital Tests and Procedure/Radiological results at the follow up. Please get all Hospital records sent to your PCP by signing hospital release before you go home.   Do not drive, operating heavy machinery, perform activities at heights, swimming or participation in water activities or provide baby sitting services while you are on Pain, Sleep and Anxiety Medications; until you have been seen by Primary Care Physician and are cleared to do such activities.  Do not take more than prescribed Pain, Sleep and Anxiety Medications.  You were cared for by a hospitalist during your hospital stay. If you have any questions about your discharge medications or the care you received while you were in the hospital after you are discharged, you can call the hospital unit/floor you were admitted to and ask to speak with the hospitalist who took care of you. Ask for Hospitalist on call, if the hospitalist that took care of you is not available. Once you are discharged, your primary care physician will help you with any further medical issues. You Must read complete instructions/literature along with all the possible adverse reactions/side effects for all the Medicines you take and that have been prescribed  to you. Take any new Medicines after you have completely understood and accept all the possible adverse reactions/side effects. If you have smoked or chewed Tobacco, please STOP smoking. If you drink alcohol, please safely reduce the use. Do not drive, operating heavy machinery, perform activities at heights, swimming or participation in water activities or provide baby sitting services under influence.   Discharge wound care:   Complete by: As directed     Wrap the heel with a piece of Xeroform gauze to the open areas and secure with a Mepilex Heel foam dressing. Change the Xeroform daily. Place the foot in a Prevalon Heel lift boots.   Increase activity slowly   Complete by: As directed        Diet recommendation: Cardiac diet  Activity: The patient is advised to gradually reintroduce usual activities, as tolerated  Discharge Condition: stable  Code Status: Full code   History of present illness: As per the H and P dictated on admission, "[name] is a 55 y.o. male with medical history significant of hypertension, diabetes mellitus type 2, gout, chronic kidney disease stage III, peripheral vascular disease s/p right BKA, and left diabetic heel ulcer who presented with complaints of abdominal pain for approximately 1 week.  Complains of a sharp and crampy abdominal pain that is epigastric and to the right upper quadrant of his abdomen.  Reports pain radiates to his back and lower right quadrant of his abdomen.  Reported associated symptoms of nausea, mild chills, shortness of breath related with pain, and constipation(last bowel movement 4 days ago). Symptoms worsened with any kind of movements and after eating.  He had been taking oxycodone for pain at home after his last discharge.  Patient has been dealing with right knee pain and swelling as well for which he thinks he is having a gout flare, but had ran out of his indomethacin.   Patient had just recently been hospitalized from 7/30-8/1 for diabetic foot infection of the right heel with cellulitis.  He was discharged home on a 7-day course of amoxicillin and doxycycline which patient reports that he was still taking and a prescription for Darco shoes.  He does not recall picking up the Darco shoes prescribed.  He is followed by Dr. Sharol Given of orthopedics for the right heel ulcer, but he missed his last follow-up appointment due to issues with transportation."  Hospital Course:  Summary of his  active problems in the hospital is as following. Acute cholecystitis Started on Ceftriaxone and Flagyl.  General surgery consulted and performed laparoscopic cholecystectomy on 8/7.  Cleared for discharge home from a general surgery standpoint.  PT recommending SNF.  Unable to arrange SNF.  Patient will go home. Discontinue antibiotics.   Diabetic foot ulcer Recently admitted and treated with antibiotics. Was on Augmentin and doxycycline. Patient will follow-up with orthopedic surgery as an outpatient. X-ray suggesting possible concern for osteomyelitis but MRI recently without evidence of osteomyelitis. Wound is not currently infected.  Addendum to x-ray was done, comparison is made with prior left foot MRI dated 09/18/2020. This MRI demonstrated no evidence of calcaneal osteomyelitis. The areas of sclerosis and lucency in the calcaneus are therefore unrelated to acute osteomyelitis.   Hypertensive urgency Patient is on losartan, hydrochlorothiazide and metoprolol as an outpatient. Home regimen of losartan and metoprolol resumed on admission. -Continue losartan 100 mg daily and metoprolol 12.5 mg BID   S/p right BKA Orthopedic surgery consulted for inpatient evaluation and consideration of staple removal. Patient  needs to follow-up with Dr. Louanne Skye or Dr. Sharol Given in one week   Chronic anemia Currently stable. No evidence of bleeding. -CBC daily, transfuse for hemoglobin <7   Constipation -Continue Miralax, Senokot-S   Diabetes mellitus, type 2 Diabetic neuropathy Patient is on Lantus and Novolog as an outpatient. Hemoglobin A1C of 7.8%. -Continue Lantus 20 units daily and Novolog 4 TID -Continue gabapentin   CKD stage IIIb Stable.   Thrombocytosis Mild. In setting of acute infection and likely reactive.   Left leg edema. Mildly volume overloaded overall. Given IV Lasix in the hospital.  As needed Lasix on discharge.   Left heel ulcer. Discussed with orthopedics.  No indication  for MRI.  Obesity. Present the patient at high risk for poor outcome. Body mass index is 36.52 kg/m.   Pain control  - [employer] Controlled Substance Reporting System database was reviewed. - 5 day supply was provided. - Patient was instructed, not to drive, operate heavy machinery, perform activities at heights, swimming or participation in water activities or provide baby sitting services while on Pain, Sleep and Anxiety Medications; until his outpatient Physician has advised to do so again.  - Also recommended to not to take more than prescribed Pain, Sleep and Anxiety Medications.  Patient was seen by physical therapy, who recommended Home Health,. On the day of the discharge the patient's vitals were stable, and no other new acute medical condition were reported. The patient was felt safe to be discharge at Home with Home health.  Consultants: General surgery  Procedures: laparoscopic cholecystectomy  DISCHARGE MEDICATION: Allergies as of 09/29/2020   No Known Allergies      Medication List     STOP taking these medications    amoxicillin-clavulanate 875-125 MG tablet Commonly known as: Augmentin   doxycycline 100 MG tablet Commonly known as: VIBRA-TABS   losartan-hydrochlorothiazide 100-25 MG tablet Commonly known as: HYZAAR   Oxycodone HCl 10 MG Tabs   tiZANidine 4 MG capsule Commonly known as: ZANAFLEX Replaced by: tiZANidine 4 MG tablet       TAKE these medications    acetaminophen 500 MG tablet Commonly known as: TYLENOL Take 1,000 mg by mouth every 6 (six) hours as needed for mild pain or headache.   allopurinol 100 MG tablet Commonly known as: ZYLOPRIM Take 1 tablet (100 mg total) by mouth 2 (two) times daily.   BD Pen Needle Nano U/F 32G X 4 MM Misc Generic drug: Insulin Pen Needle Use as directed   Insulin Pen Needle 32G X 4 MM Misc Use as directed   CertaVite/Antioxidants Tabs Take 1 tablet by mouth daily.   docusate sodium 100 MG  capsule Commonly known as: Colace Take 1 capsule (100 mg total) by mouth 2 (two) times daily. What changed: when to take this   FeroSul 325 (65 FE) MG tablet Generic drug: ferrous sulfate Take 1 tablet (325 mg total) by mouth 3 (three) times daily after meals.   furosemide 20 MG tablet Commonly known as: Lasix Take 1 tablet (20 mg total) by mouth daily as needed.   gabapentin 300 MG capsule Commonly known as: NEURONTIN Take 1 capsule (300 mg total) by mouth 3 (three) times daily.   Lantus SoloStar 100 UNIT/ML Solostar Pen Generic drug: insulin glargine Inject 25 Units into the skin 2 (two) times daily.   losartan 100 MG tablet Commonly known as: COZAAR Take 100 mg by mouth daily.   metoprolol tartrate 25 MG tablet Commonly known as: LOPRESSOR Take 0.5 tablets (  12.5 mg total) by mouth 2 (two) times daily.   NovoLOG FlexPen 100 UNIT/ML FlexPen Generic drug: insulin aspart Inject 4 Units into the skin 3 (three) times daily with meals.   polyethylene glycol powder 17 GM/SCOOP powder Commonly known as: GLYCOLAX/MIRALAX Take 17 g by mouth daily as needed for moderate constipation.   tiZANidine 4 MG tablet Commonly known as: ZANAFLEX Take 1 tablet (4 mg total) by mouth 2 (two) times daily as needed for muscle spasms. Replaces: tiZANidine 4 MG capsule   traMADol-acetaminophen 37.5-325 MG tablet Commonly known as: Ultracet Take 1 tablet by mouth every 6 (six) hours as needed.   vitamin C 1000 MG tablet Take 1 tablet (1,000 mg total) by mouth daily.   Zinc Sulfate 220 (50 Zn) MG Tabs Take 1 tablet (220 mg total) by mouth daily.               Discharge Care Instructions  (From admission, onward)           Start     Ordered   09/29/20 0000  Discharge wound care:       Comments: Wrap the heel with a piece of Xeroform gauze to the open areas and secure with a Mepilex Heel foam dressing. Change the Xeroform daily. Place the foot in a Prevalon Heel lift boots.    09/29/20 1113            Discharge Exam: Filed Weights   09/24/20 2336  Weight: 136.1 kg   Vitals:   09/29/20 0755 09/29/20 1233  BP: (!) 165/104 (!) 170/101  Pulse: (!) 102 (!) 101  Resp: 20 20  Temp: 98.5 F (36.9 C) 98.4 F (36.9 C)  SpO2: 100% 99%   General: Appear in mild distress, no Rash; Oral Mucosa Clear, moist. no Abnormal Neck Mass Or lumps, Conjunctiva normal  Cardiovascular: S1 and S2 Present, no Murmur, Respiratory: good respiratory effort, Bilateral Air entry present and CTA, no Crackles, no wheezes Abdomen: Bowel Sound present, Soft and no tenderness Extremities: Right BKA, left leg pedal edema Neurology: alert and oriented to time, place, and person affect appropriate. no new focal deficit Gait not checked due to patient safety concerns   The results of significant diagnostics from this hospitalization (including imaging, microbiology, ancillary and laboratory) are listed below for reference.    Significant Diagnostic Studies: DG Chest 2 View  Result Date: 09/17/2020 CLINICAL DATA:  Chills for 1 day.  Blister on right lower leg. EXAM: CHEST - 2 VIEW COMPARISON:  09/13/2020 FINDINGS: Mild cardiac enlargement. No pleural effusion or edema. No airspace opacities identified. Thoracic spondylosis noted. IMPRESSION: No acute cardiopulmonary abnormalities. Electronically Signed   By: Kerby Moors M.D.   On: 09/17/2020 07:41   DG Chest 2 View  Result Date: 09/13/2020 CLINICAL DATA:  55 year old male with concern for pneumonia. EXAM: CHEST - 2 VIEW COMPARISON:  Chest radiograph dated 08/31/2020 FINDINGS: No focal consolidation, pleural effusion or pneumothorax. Borderline cardiomegaly. No acute osseous pathology. IMPRESSION: No active cardiopulmonary disease. Electronically Signed   By: Anner Crete M.D.   On: 09/13/2020 21:40   DG Knee 1-2 Views Right  Result Date: 09/13/2020 CLINICAL DATA:  Left knee pain after fall. Patient status post below-the-knee  amputation 08/27/2020. EXAM: RIGHT KNEE - 1-2 VIEW COMPARISON:  None. FINDINGS: No evidence of fracture, dislocation, or joint effusion. No evidence of arthropathy or other focal bone abnormality. The patient is status post below-the-knee amputation. Surgical staples are in place. No soft tissue gas or  unexpected radiopaque foreign body. Mild degenerative change is seen about the knee. Soft tissues are unremarkable. IMPRESSION: No acute abnormality. Electronically Signed   By: Inge Rise M.D.   On: 09/13/2020 17:57   DG Tibia/Fibula Right  Result Date: 09/17/2020 CLINICAL DATA:  Chills.  Recent amputation. EXAM: RIGHT TIBIA AND FIBULA - 2 VIEW COMPARISON:  A 09/13/2020 FINDINGS: There is scratch set signs of recent below the knee amputation. There is a small suprapatellar joint effusion with a few loose bodies in the suprapatellar joint space. No signs of acute fracture or dislocation. No focal bone erosions identified. Diffuse soft tissue scratch set mild diffuse soft tissue swelling. IMPRESSION: 1. Small suprapatellar joint effusion. 2. No focal bone erosions identified. 3. Soft tissue swelling. Electronically Signed   By: Kerby Moors M.D.   On: 09/17/2020 07:44   MR HEEL LEFT W WO CONTRAST  Result Date: 09/18/2020 CLINICAL DATA:  Diabetic patient with a left foot wound which began as a blister 2 days ago. Draining wound on the left heel. EXAM: MRI OF LOWER LEFT EXTREMITY WITHOUT AND WITH CONTRAST TECHNIQUE: Multiplanar, multisequence MR imaging of the left ankle was performed both before and after administration of intravenous contrast. CONTRAST:  10 mL GADAVIST IV SOLN COMPARISON:  Plain films left ankle 09/17/2020. FINDINGS: Bones/Joint/Cartilage There is no marrow edema or enhancement to suggest osteomyelitis. Low level edema throughout the midfoot due to Charcot change is noted. No fracture. Plantar calcaneal spur noted. Ligaments Intact. Muscles and Tendons Intact.  Negative for myositis.  Soft tissues Intense subcutaneous edema is present about the ankle. IMPRESSION: Intense subcutaneous edema about the ankle could be due to dependent change or cellulitis. Negative for abscess, myositis, osteomyelitis or septic joint. Neuropathic change of the midfoot. Electronically Signed   By: Inge Rise M.D.   On: 09/18/2020 09:46   US Abdomen Limited  Result Date: 09/25/2020 CLINICAL DATA:  Right upper quadrant pain EXAM: ULTRASOUND ABDOMEN LIMITED RIGHT UPPER QUADRANT COMPARISON:  None. FINDINGS: Gallbladder: Multiple stones within the gallbladder measuring up to 1.7 cm. There is sludge. Mild wall thickening at 3.6 mm. The patient was tender over the gallbladder during the study. Common bile duct: Diameter: Normal caliber, 3 mm Liver: No focal lesion identified. Within normal limits in parenchymal echogenicity. Portal vein is patent on color Doppler imaging with normal direction of blood flow towards the liver. Other: None. IMPRESSION: Stones and sludge within the gallbladder. Mild gallbladder wall thickening and tenderness over the gallbladder. Findings concerning for acute cholecystitis. Electronically Signed   By: Rolm Baptise M.D.   On: 09/25/2020 00:21   DG Chest Portable 1 View  Result Date: 09/22/2020 CLINICAL DATA:  Fall a few hours ago with right rib pain EXAM: PORTABLE CHEST 1 VIEW COMPARISON:  09/17/2020 FINDINGS: Chronic cardiomegaly and aortic tortuosity. There is no edema, consolidation, effusion, or pneumothorax. No visible fracture. Advanced left glenohumeral osteoarthritis. IMPRESSION: Stable exam.  No acute finding. Electronically Signed   By: Monte Fantasia M.D.   On: 09/22/2020 04:27   DG CHEST PORT 1 VIEW  Result Date: 08/31/2020 CLINICAL DATA:  Fever EXAM: PORTABLE CHEST 1 VIEW COMPARISON:  08/26/2020 FINDINGS: The heart size and mediastinal contours are stable. No focal airspace consolidation, pleural effusion, or pneumothorax. The visualized skeletal structures are  unremarkable. IMPRESSION: No active disease. Electronically Signed   By: Davina Poke D.O.   On: 08/31/2020 12:40   DG Knee Complete 4 Views Left  Result Date: 09/13/2020 CLINICAL DATA:  55 year old male  with left knee pain. EXAM: LEFT KNEE - COMPLETE 4+ VIEW COMPARISON:  Right knee radiograph dated 09/13/2020. FINDINGS: There is no acute fracture or dislocation. Mild osteopenia. There is moderate arthritic changes of the left knee with tricompartmental narrowing and spurring. There is a small suprapatellar effusion. There is diffuse subcutaneous edema. IMPRESSION: 1. No acute fracture or dislocation. 2. Degenerative changes and small suprapatellar effusion. Electronically Signed   By: Anner Crete M.D.   On: 09/13/2020 17:56   DG Foot 2 Views Left  Addendum Date: 09/27/2020   ADDENDUM REPORT: 09/27/2020 13:50 ADDENDUM: Comparison is made with prior left foot MRI dated 09/18/2020. This MRI demonstrated no evidence of calcaneal osteomyelitis. The areas of sclerosis and lucency in the calcaneus are therefore unrelated to acute osteomyelitis. Electronically Signed   By: Vinnie Langton M.D.   On: 09/27/2020 13:50   Result Date: 09/27/2020 CLINICAL DATA:  55 year old male with history of swelling in the left foot and ulcer overlying the left heel. Evaluate for osteomyelitis. EXAM: LEFT FOOT - 2 VIEW COMPARISON:  Left foot radiograph 09/17/2020. FINDINGS: Two views of the left foot demonstrate irregular areas of sclerosis and lucency in the calcaneus. Small plantar calcaneal spur. No acute displaced fracture or dislocation. Multifocal joint space narrowing, subchondral sclerosis, subchondral cyst formation and osteophyte formation is noted in the midfoot and at the first MTP joint, indicative of osteoarthritis. Pes planus. IMPRESSION: 1. In addition to chronic degenerative changes in the foot, there are irregular areas of sclerosis and lucency in the calcaneus. Given the patient's history of heel ulcer,  the possibility of chronic calcaneal osteomyelitis warrants consideration and further evaluation with left foot MRI with and without IV gadolinium is recommended to better evaluate this finding. Electronically Signed: By: Vinnie Langton M.D. On: 09/25/2020 08:49   DG Foot Complete Left  Result Date: 09/17/2020 CLINICAL DATA:  Left foot pain EXAM: LEFT FOOT - COMPLETE 3+ VIEW COMPARISON:  None. FINDINGS: Pes planus deformity. 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 No acute fracture or dislocation. Small plantar calcaneal spur. Mild diffuse soft tissue swelling of the left foot. Extensive artifact related to the patient's overlying sock noted. IMPRESSION: Degenerative changes.  No acute fracture or dislocation. Electronically Signed   By: Fidela Salisbury MD   On: 09/17/2020 01:41   VAS [place] ABI WITH/WO TBI  Result Date: 09/18/2020  LOWER EXTREMITY DOPPLER STUDY Patient Name:  Avraj Schlabach  Date of Exam:   09/18/2020 Medical Rec #: [id number]   Accession #:    [id number] Date of Birth: Jul 30, 1965   Patient Gender: M Patient Age:   70Y Exam Location:  [facility] Procedure:      VAS [place] ABI WITH/WO TBI Referring Phys: --------------------------------------------------------------------------------  Indications: Ulceration. High Risk Factors: Hypertension, Diabetes.  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 Limitations: Today's exam was limited due to Right BKA. Skin texture, size of              great toe. Comparison Study: Prior ABI one 06/05/20 Performing Technologist: Sharion Dove RVS  Examination Guidelines: A complete evaluation includes at minimum, Doppler waveform signals and systolic blood pressure reading at the  level of bilateral brachial, anterior tibial, and posterior tibial arteries, when vessel segments are accessible. Bilateral testing is considered an integral part of a complete examination. Photoelectric Plethysmograph (PPG) waveforms and toe systolic pressure readings are included as required and additional duplex testing as needed. Limited examinations for reoccurring indications may be performed as noted.  ABI Findings: +---------+------------------+-----+---------+--------+ Right    Rt Pressure (mmHg)IndexWaveform Comment  +---------+------------------+-----+---------+--------+ Brachial 145                    triphasic         +---------+------------------+-----+---------+--------+ PTA                                      BKA      +---------+------------------+-----+---------+--------+ DP                                       BKA      +---------+------------------+-----+---------+--------+ Great Toe                                BKA      +---------+------------------+-----+---------+--------+ +---------+------------------+-----+-----------+-------------------------------+ Left     Lt Pressure (mmHg)IndexWaveform   Comment                         +---------+------------------+-----+-----------+-------------------------------+ Brachial 140                    triphasic                                  +---------+------------------+-----+-----------+-------------------------------+ PTA      157               1.08 multiphasic                                +---------+------------------+-----+-----------+-------------------------------+ DP       174               1.20 multiphasic                                +---------+------------------+-----+-----------+-------------------------------+ Great Toe                                  Unable to attain Toe pressure                                              secondary to size of toe         +---------+------------------+-----+-----------+-------------------------------+ +-------+-----------+-----------+------------+------------+ ABI/TBIToday's ABIToday's TBIPrevious ABIPrevious TBI +-------+-----------+-----------+------------+------------+ Right  BKA                   1.18                     +-------+-----------+-----------+------------+------------+ Left   1.14                                           +-------+-----------+-----------+------------+------------+  Left ABIs appear essentially unchanged compared to prior study on 06/05/20.  Summary: Right: BKA. Left: Resting left ankle-brachial index is within normal range. No evidence of significant left lower extremity arterial disease. Unable to attain great toe pressure secondary to size of toe.  *See table(s) above for measurements and observations.  Electronically signed by Deitra Mayo MD on 09/18/2020 at 1:30:36 PM.    Final    CT Renal Stone Study  Result Date: 09/22/2020 CLINICAL DATA:  55 year old male with history of right-sided flank pain. Three recent falls since 08/27/2020. EXAM: CT ABDOMEN AND PELVIS WITHOUT CONTRAST TECHNIQUE: Multidetector CT imaging of the abdomen and pelvis was performed following the standard protocol without IV contrast. COMPARISON:  No priors. FINDINGS: Lower chest: Atherosclerotic calcifications in the right coronary artery. Hepatobiliary: No discrete cystic or solid hepatic lesions confidently identified on today's noncontrast CT examination. Numerous partially calcified gallstones lie dependently in the gallbladder. Gallbladder is moderately distended. No definite pericholecystic fluid or inflammatory changes are confidently identified. Pancreas: No definite pancreatic mass or peripancreatic fluid collections or inflammatory changes noted on today's noncontrast CT examination. Spleen: Small splenule inferior to the spleen. Otherwise, unremarkable. Adrenals/Urinary Tract: There are no  abnormal calcifications within the collecting system of either kidney, along the course of either ureter, or within the lumen of the urinary bladder. No hydroureteronephrosis to suggest urinary tract obstruction at this time. Bilateral perinephric stranding is noted, but nonspecific. The unenhanced appearance of the kidneys is unremarkable bilaterally. Unenhanced appearance of the urinary bladder is normal. Bilateral adrenal glands are normal in appearance. Stomach/Bowel: Unenhanced appearance of the stomach is normal. No pathologic dilatation of small bowel or colon. Normal appendix. Vascular/Lymphatic: Mild atherosclerotic calcifications in the abdominal aorta. No lymphadenopathy noted in the abdomen or pelvis. Reproductive: Prostate gland and seminal vesicles are unremarkable in appearance. Other: No significant volume of ascites.  No pneumoperitoneum. Musculoskeletal: There are no aggressive appearing lytic or blastic lesions noted in the visualized portions of the skeleton. IMPRESSION: 1. No acute findings are noted in the abdomen or pelvis to account for the patient's symptoms. 2. Cholelithiasis without definitive evidence to suggest an acute cholecystitis at this time. 3. Aortic atherosclerosis. 4. Additional incidental findings, as above. Electronically Signed   By: Vinnie Langton M.D.   On: 09/22/2020 06:53   DG Hip Unilat W or Wo Pelvis 2-3 Views Right  Result Date: 09/13/2020 CLINICAL DATA:  Right hip and groin pain since a fall 2 days ago. EXAM: DG HIP (WITH OR WITHOUT PELVIS) 2-3V RIGHT COMPARISON:  None. FINDINGS: There is no acute bony or joint abnormality. 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 Soft tissues are negative. IMPRESSION: No acute abnormality. Electronically Signed   By: Inge Rise M.D.   On: 09/13/2020 17:55   VAS [place] LOWER EXTREMITY VENOUS (DVT) (ONLY [facility] & [facility] 7a-7p)  Result Date: 09/14/2020  Lower Venous DVT Study Patient Name:  NYZIER REMMICK  Date of Exam:   09/14/2020 Medical Rec #: [id number]     Accession #:    [id number] Date of Birth: 07-16-1965     Patient Gender: M Patient Age:   54Y Exam Location:  [facility] Procedure:      VAS [place] LOWER EXTREMITY VENOUS (DVT) Referring Phys: [id number] KELSEY N FORD --------------------------------------------------------------------------------  Indications: Pain, and Swelling. Other Indications: RLE BKA 2 weeks ago. Limitations: Poor ultrasound/tissue interface. Comparison Study: Previous exam 05/03/19 - negative Performing Technologist: Rogelia Rohrer RVT, RDMS  Examination Guidelines: A  complete evaluation includes B-mode imaging, spectral Doppler, color Doppler, and power Doppler as needed of all accessible portions of each vessel. Bilateral testing is considered an integral part of a complete examination. Limited examinations for reoccurring indications may be performed as noted. The reflux portion of the exam is performed with the patient in reverse Trendelenburg.  +---------+---------------+---------+-----------+----------+-------------------+ RIGHT    CompressibilityPhasicitySpontaneityPropertiesThrombus Aging      +---------+---------------+---------+-----------+----------+-------------------+ CFV      Full           Yes      Yes                                      +---------+---------------+---------+-----------+----------+-------------------+ SFJ      Full                                                             +---------+---------------+---------+-----------+----------+-------------------+ FV Prox  Full           Yes      Yes                                      +---------+---------------+---------+-----------+----------+-------------------+ FV Mid   Full           Yes      Yes                                      +---------+---------------+---------+-----------+----------+-------------------+ FV DistalFull           Yes      Yes                                       +---------+---------------+---------+-----------+----------+-------------------+ PFV      Full                                                             +---------+---------------+---------+-----------+----------+-------------------+ POP      Full           Yes      Yes                  Not well visualized +---------+---------------+---------+-----------+----------+-------------------+ PTV                                                   BKA                 +---------+---------------+---------+-----------+----------+-------------------+ PERO  BKA                 +---------+---------------+---------+-----------+----------+-------------------+   +----+---------------+---------+-----------+----------+--------------+ LEFTCompressibilityPhasicitySpontaneityPropertiesThrombus Aging +----+---------------+---------+-----------+----------+--------------+ CFV Full           Yes      Yes                                 +----+---------------+---------+-----------+----------+--------------+    Summary: RIGHT: - There is no evidence of superficial venous thrombosis. - There is no evidence of deep vein thrombosis in the lower extremity.  - Ultrasound characteristics of enlarged lymph nodes are noted in the groin.  LEFT: - No evidence of common femoral vein obstruction. - Ultrasound characteristics of enlarged lymph nodes noted in the groin.  *See table(s) above for measurements and observations. Electronically signed by Jamelle Haring on 09/14/2020 at 3:54:03 PM.    Final     Microbiology: Recent Results (from the past 240 hour(s))  Resp Panel by RT-PCR (Flu A&B, Covid) Nasopharyngeal Swab     Status: None   Collection Time: 09/25/20  7:33 AM   Specimen: Nasopharyngeal Swab; Nasopharyngeal(NP) swabs in vial transport medium  Result Value Ref Range Status   SARS Coronavirus 2 by RT PCR NEGATIVE NEGATIVE Final     Comment: (NOTE) SARS-CoV-2 target nucleic acids are NOT DETECTED.  The SARS-CoV-2 RNA is generally detectable in upper respiratory specimens during the acute phase of infection. The lowest concentration of SARS-CoV-2 viral copies this assay can detect is 138 copies/mL. A negative result does not preclude SARS-Cov-2 infection and should not be used as the sole basis for treatment or other patient management decisions. A negative result may occur with  improper specimen collection/handling, submission of specimen other than nasopharyngeal swab, presence of viral mutation(s) within the areas targeted by this assay, and inadequate number of viral copies(<138 copies/mL). A negative result must be combined with clinical observations, patient history, and epidemiological information. The expected result is Negative.  Fact Sheet for Patients:  EntrepreneurPulse.com.au  Fact Sheet for Healthcare Providers:  IncredibleEmployment.be  This test is no t yet approved or cleared by the [place] FDA and  has been authorized for detection and/or diagnosis of SARS-CoV-2 by FDA under an Emergency Use Authorization (EUA). This EUA will remain  in effect (meaning this test can be used) for the duration of the COVID-19 declaration under Section 564(b)(1) of the Act, 21 U.S.C.section 360bbb-3(b)(1), unless the authorization is terminated  or revoked sooner.       Influenza A by PCR NEGATIVE NEGATIVE Final   Influenza B by PCR NEGATIVE NEGATIVE Final    Comment: (NOTE) The Xpert Xpress SARS-CoV-2/FLU/RSV plus assay is intended as an aid in the diagnosis of influenza from Nasopharyngeal swab specimens and should not be used as a sole basis for treatment. Nasal washings and aspirates are unacceptable for Xpert Xpress SARS-CoV-2/FLU/RSV testing.  Fact Sheet for Patients: EntrepreneurPulse.com.au  Fact Sheet for Healthcare  Providers: IncredibleEmployment.be  This test is not yet approved or cleared by the [place] FDA and has been authorized for detection and/or diagnosis of SARS-CoV-2 by FDA under an Emergency Use Authorization (EUA). This EUA will remain in effect (meaning this test can be used) for the duration of the COVID-19 declaration under Section 564(b)(1) of the Act, 21 U.S.C. section 360bbb-3(b)(1), unless the authorization is terminated or revoked.  Performed at [facility], [address] [place], [place] [address]  Labs: CBC: Recent Labs  Lab 09/24/20 2351 09/26/20 0022 09/27/20 0022 09/28/20 0115  WBC 12.0* 16.7* 12.5* 12.4*  NEUTROABS 7.8*  --   --   --   HGB 8.3* 7.1* 7.1* 7.6*  HCT 27.3* 22.9* 24.5* 25.4*  MCV 83.2 81.5 85.1 84.1  PLT 506* 507* 466* 123XX123*   Basic Metabolic Panel: Recent Labs  Lab 09/24/20 2351 09/26/20 0022 09/28/20 0115 09/29/20 0211  NA 138 137 140 139  K 3.8 3.8 3.9 4.1  CL 105 103 105 106  CO2 '26 26 25 23  '$ GLUCOSE 196* 161* 144* 141*  BUN '13 10 13 14  '$ CREATININE 1.28* 1.12 1.29* 1.29*  CALCIUM 8.9 8.5* 8.5* 8.6*  MG  --   --   --  1.3*   Liver Function Tests: Recent Labs  Lab 09/24/20 2351 09/26/20 0022  AST 15 26  ALT 13 16  ALKPHOS 114 92  BILITOT 0.7 0.6  PROT 7.1 6.1*  ALBUMIN 2.5* 2.3*   CBG: Recent Labs  Lab 09/28/20 1621 09/28/20 1755 09/28/20 2154 09/29/20 0747 09/29/20 1223  GLUCAP 145* 121* 113* 123* 117*    Time spent: 35 minutes  Signed:  Berle Mull  [facility]  09/29/2020

## 2020-09-29 NOTE — Progress Notes (Addendum)
Pt has refuse gabapentin and acetaminophen, he states "that he is not having any pain".   Pt did not place food order stated "he will be going home in a bit and he will eat then".

## 2020-09-29 NOTE — Progress Notes (Addendum)
Physical Therapy Treatment Patient Details Name: TAIWO GAGLIANO MRN: [id number] DOB: 09-14-1965 Today's Date: 09/29/2020    History of Present Illness Pt is a 55yo male admitted on 8/6 with complaints of right-sided flank pain secondary to acute cholescystis. S/p laparoscopic cholescystectomy on 8/7. Also complaining of left heel ulcer; xray suggestive of possible osteomyolitis. Of note: pt fell on 8/4 and was seen at [facility] ED. 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    PT Comments    Educated pt verbally and with demonstration and provided handout on exercises to improve strength and ROM of R lower extremity in addition to education on elevating, wrapping, and shrinking R residual limb in prep for pt's goal to eventually get a prosthesis. Focused session on transfer and bathroom mobility training using a RW as pt reports his w/c does not fit through the doorway into his bathroom where he is currently living. Pt displays limitations in strength, coordination, and balance that impact his safety with mobility, but he was able to ambulate up to ~7 ft and perform transfers with a RW without physical assistance, min guard assist for safety this date. Will continue to follow acutely. Pt would benefit from further skilled PT in a SNF setting, but if unable to do so then recommend HHPT.    Follow Up Recommendations  SNF;Supervision for mobility/OOB (HHPT if unable to go SNF)     Equipment Recommendations  Rolling walker with 5" wheels;3in1 (PT)    Recommendations for Other Services       Precautions / Restrictions Precautions Precautions: Fall Precaution Comments: recent R BKA Restrictions Weight Bearing Restrictions: Yes RLE Weight Bearing: Non weight bearing    Mobility  Bed Mobility               General bed mobility comments: Pt sitting up in recliner upon arrival.    Transfers Overall transfer level: Needs assistance Equipment used: Rolling walker (2  wheeled) Transfers: Sit to/from Stand Sit to Stand: Min guard         General transfer comment: x2 attempts before successful for first rep sit to stand from recliner > RW. Extra time and effort, with pt extending knees before able to extend hips and trunk over RW, but no physical assistance required. Min guard assist for safety. sit to stand 1x from recliner and 1x from low commode.  Ambulation/Gait Ambulation/Gait assistance: Min guard Gait Distance (Feet): 7 Feet (x2 bouts of ~7 ft each bout) Assistive device: Rolling walker (2 wheeled) Gait Pattern/deviations: Trunk flexed (hop-through) Gait velocity: decreased Gait velocity interpretation: <1.31 ft/sec, indicative of household ambulator General Gait Details: Pt with trunk flexed, hopping through using RW. Displays poor RW management, wobbling one wheel off ground occasionally, needing cues to slow down and correct posture and RW placement before continuing. No LOB, min guard assist for safety.   Stairs             Wheelchair Mobility    Modified Rankin (Stroke Patients Only)       Balance Overall balance assessment: Needs assistance Sitting-balance support: No upper extremity supported;Feet supported Sitting balance-Leahy Scale: Good     Standing balance support: Bilateral upper extremity supported;During functional activity Standing balance-Leahy Scale: Poor Standing balance comment: Reliant on UE support for mobility.                            Cognition Arousal/Alertness: Awake/alert Behavior During Therapy: [facility]  for tasks assessed/performed Overall Cognitive Status: Within Functional Limits for tasks assessed                                        Exercises General Exercises - Lower Extremity Mini-Sqauts: Other reps (comment);Standing (x7 reps standing with RW)    General Comments General comments (skin integrity, edema, etc.): Educated pt on shrinking, elevating, shaping,  and strenghtening and stretching R leg to prepare for goal of prosthesis      Pertinent Vitals/Pain Pain Assessment: Faces Faces Pain Scale: Hurts a little bit Pain Location: L knee Pain Descriptors / Indicators: Sore;Guarding Pain Intervention(s): Limited activity within patient's tolerance;Monitored during session;Repositioned    Home Living                      Prior Function            PT Goals (current goals can now be found in the care plan section) Acute Rehab PT Goals Patient Stated Goal: to go home and eventually get R leg prosthesis PT Goal Formulation: With patient Time For Goal Achievement: 10/10/20 Potential to Achieve Goals: Good Progress towards PT goals: Progressing toward goals    Frequency    Min 3X/week      PT Plan Current plan remains appropriate    Co-evaluation              AM-PAC PT "6 Clicks" Mobility   Outcome Measure  Help needed turning from your back to your side while in a flat bed without using bedrails?: A Little Help needed moving from lying on your back to sitting on the side of a flat bed without using bedrails?: A Little Help needed moving to and from a bed to a chair (including a wheelchair)?: A Little Help needed standing up from a chair using your arms (e.g., wheelchair or bedside chair)?: A Little Help needed to walk in hospital room?: A Little Help needed climbing 3-5 steps with a railing? : Total 6 Click Score: 16    End of Session Equipment Utilized During Treatment: Gait belt Activity Tolerance: Patient limited by pain Patient left: in chair;with chair alarm set;with call bell/phone within reach   PT Visit Diagnosis: Pain;Muscle weakness (generalized) (M62.81);History of falling (Z91.81);Other abnormalities of gait and mobility (R26.89);Unsteadiness on feet (R26.81) Pain - Right/Left: Left Pain - part of body: Knee     Time: [id number] PT Time Calculation (min) (ACUTE ONLY): 16 min  Charges:   $Therapeutic Activity: 8-22 mins                     Moishe Spice, PT, DPT Acute Rehabilitation Services  Pager: [phone] Office: [phone]    Orvan Falconer 09/29/2020, 1:03 PM

## 2020-09-29 NOTE — Progress Notes (Signed)
Jolinda Croak to be D/C'd  per MD order.  Discussed with the patient and all questions fully answered.  VSS, Skin clean, dry and intact without evidence of skin break down, no evidence of skin tears noted.  IV catheter discontinued intact. Site without signs and symptoms of complications. Dressing and pressure applied.  An After Visit Summary was printed and given to the patient. Patient received prescription from [facility]. Gave patient dressing for his left foot wound.  Patient instructed to return to ED, call 911, or call MD for any changes in condition.   Patient to be escorted via [place], and D/C home via private auto.

## 2020-10-01 ENCOUNTER — Other Ambulatory Visit: Payer: Self-pay

## 2020-10-01 ENCOUNTER — Ambulatory Visit (HOSPITAL_COMMUNITY)
Admission: EM | Admit: 2020-10-01 | Discharge: 2020-10-01 | Disposition: A | Discharge status: Home / Self Care | Payer: Self-pay | Attending: Family Medicine | Admitting: Family Medicine

## 2020-10-01 ENCOUNTER — Encounter (HOSPITAL_COMMUNITY): Payer: Self-pay | Admitting: Emergency Medicine

## 2020-10-01 DIAGNOSIS — Z794 Long term (current) use of insulin: Secondary | ICD-10-CM

## 2020-10-01 DIAGNOSIS — E11621 Type 2 diabetes mellitus with foot ulcer: Secondary | ICD-10-CM

## 2020-10-01 DIAGNOSIS — L97529 Non-pressure chronic ulcer of other part of left foot with unspecified severity: Secondary | ICD-10-CM

## 2020-10-01 MED ORDER — HIBICLENS 4 % EX LIQD: Freq: Every day | CUTANEOUS | 0 refills | Status: DC | PRN

## 2020-10-01 MED ORDER — DOXYCYCLINE HYCLATE 100 MG PO CAPS: 100.0000 mg | ORAL_CAPSULE | Freq: Two times a day (BID) | ORAL | 0 refills | Status: DC

## 2020-10-01 NOTE — ED Triage Notes (Signed)
Patient c/o Blister " on the bottom of LFT foot" that started yesterday.   Patient endorses history of blisters on foot. Patient has an amputated limb on RT side.   Patient denies pain or drainage.   Patient has Type II DM.   Patient hasn't tried any medications for symptoms.

## 2020-10-02 NOTE — ED Provider Notes (Signed)
[place] [facility]    CSN: [id number] Arrival date & time: 10/01/20  1714      History   Chief Complaint Chief Complaint  Patient presents with   Blister    HPI Patrick [name] is a 55 y.o. male.   Patient presenting today with 1 day history of painful blister forming on the bottom of his left foot.  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  He states he recently got over a significant infection in the left foot just prior to this area being noticed.  No known injury to the area, no drainage, fevers, significant redness that is noticed at this time.  Has not tried anything over-the-counter for symptoms thus far.  History of poorly controlled diabetes mellitus, diabetic foot ulcers, osteomyelitis, peripheral vascular disease.   Past Medical History:  Diagnosis Date   Anemia    Diabetes mellitus    Gout    History of COVID-19 04/2019   Hypertension    PVD (peripheral vascular disease) ([place])    s/p R BKA    Patient Active Problem List   Diagnosis Date Noted   Cholelithiasis with acute cholecystitis 09/25/2020   Hypertensive urgency 09/25/2020   Constipation 09/25/2020   Thrombocytosis 09/25/2020   S/P BKA (below knee amputation) unilateral, right ([place]) 09/01/2020   ABLA (acute blood loss anemia) 09/01/2020   Anemia 08/27/2020   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 ([place]) 07/20/2020   Dehiscence of amputation stump ([place]) 07/20/2020   Cutaneous abscess of right foot    Osteomyelitis of right foot (HCC)    Severe protein-calorie malnutrition ([place])    Sepsis due to cellulitis ([place]) 06/04/2020   Diabetic foot ulcer ([place]) 06/04/2020   Diabetic foot infection ([place]) 06/04/2020   CKD (chronic kidney disease) stage 3, GFR 30-59 ml/min ([place]) 06/04/2020   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[place]) 06/04/2020   Acute respiratory disease due to  COVID-19 virus 05/03/2019   Renal insufficiency 05/03/2019   GOUT 01/27/2010   OBESITY 01/27/2010   Insulin-requiring or dependent type II diabetes mellitus ([place]) 02/20/2008   HYPERTENSION, BENIGN ESSENTIAL 02/20/2008    Past Surgical History:  Procedure Laterality Date   AMPUTATION Right 06/06/2020   Procedure: AMPUTATION 5TH RAY;  Surgeon: [name], MD;  Location: [place];  Service: Orthopedics;  Laterality: Right;   AMPUTATION Right 06/08/2020   Procedure: RIGHT 4TH RAY AMPUTATION;  Surgeon: [name], MD;  Location: [place];  Service: Orthopedics;  Laterality: Right;   AMPUTATION Right 08/27/2020   Procedure: AMPUTATION BELOW KNEE;  Surgeon: Jessy Oto, MD;  Location: [facility];  Service: Orthopedics;  Laterality: Right;   CHOLECYSTECTOMY N/A 09/25/2020   Procedure: LAPAROSCOPIC CHOLECYSTECTOMY;  Surgeon: Jesusita Oka, MD;  [facility];  Service: General;  Laterality: N/A;       Home Medications    Prior to Admission medications   Medication Sig Start Date End Date Taking? Authorizing Provider  chlorhexidine (HIBICLENS) 4 % external liquid Apply topically daily as needed. 10/01/20  Yes Volney American, PA-C  doxycycline (VIBRAMYCIN) 100 MG capsule Take 1 capsule (100 mg total) by mouth 2 (two) times daily. 10/01/20  Yes Volney American, PA-C  furosemide (LASIX) 20 MG tablet Take 1 tablet (20 mg total) by mouth daily as needed. 09/29/20 09/29/21 Yes Berle Mull  M, MD  insulin aspart (NOVOLOG FLEXPEN) 100 UNIT/ML FlexPen Inject 4 Units into the skin 3 (three) times daily with meals. 09/08/20  Yes Angiulli, Lavon Paganini, PA-C  insulin glargine (LANTUS SOLOSTAR) 100 UNIT/ML Solostar Pen Inject 25 Units into the skin 2 (two) times daily. 09/08/20  Yes Angiulli, Lavon Paganini, PA-C  losartan (COZAAR) 100 MG tablet Take 100 mg by mouth daily.   Yes [provider]  acetaminophen (TYLENOL) 500 MG tablet Take 1,000 mg by mouth every 6 (six) hours as needed for mild pain  or headache.    [provider]  allopurinol (ZYLOPRIM) 100 MG tablet Take 1 tablet (100 mg total) by mouth 2 (two) times daily. 09/08/20   Angiulli, Lavon Paganini, PA-C  ascorbic acid (VITAMIN C) 1000 MG tablet Take 1 tablet (1,000 mg total) by mouth daily. 09/08/20   Angiulli, Lavon Paganini, PA-C  docusate sodium (COLACE) 100 MG capsule Take 1 capsule (100 mg total) by mouth 2 (two) times daily. 09/29/20 09/29/21  Lavina Hamman, MD  ferrous sulfate 325 (65 FE) MG tablet Take 1 tablet (325 mg total) by mouth 3 (three) times daily after meals. 09/08/20   Angiulli, Lavon Paganini, PA-C  gabapentin (NEURONTIN) 300 MG capsule Take 1 capsule (300 mg total) by mouth 3 (three) times daily. 09/08/20   Angiulli, Lavon Paganini, PA-C  Insulin Pen Needle 32G X 4 MM MISC Use as directed 09/08/20   [name], Clide Deutscher, MD  Insulin Pen Needle 32G X 4 MM MISC Use as directed 09/09/20   Angiulli, Lavon Paganini, PA-C  metoprolol tartrate (LOPRESSOR) 25 MG tablet Take 0.5 tablets (12.5 mg total) by mouth 2 (two) times daily. 09/08/20   Angiulli, Lavon Paganini, PA-C  Multiple Vitamins-Minerals (CERTAVITE/ANTIOXIDANTS) TABS Take 1 tablet by mouth daily. 09/29/20   Lavina Hamman, MD  polyethylene glycol powder (GLYCOLAX/MIRALAX) 17 GM/SCOOP powder Take 17 g by mouth daily as needed for moderate constipation. 09/29/20   Lavina Hamman, MD  tiZANidine (ZANAFLEX) 4 MG tablet Take 1 tablet (4 mg total) by mouth 2 (two) times daily as needed for muscle spasms. 09/29/20   Lavina Hamman, MD  traMADol-acetaminophen (ULTRACET) 37.5-325 MG tablet Take 1 tablet by mouth every 6 (six) hours as needed. 09/29/20   Lavina Hamman, MD  Zinc Sulfate 220 (50 Zn) MG TABS Take 1 tablet (220 mg total) by mouth daily. 09/08/20   Angiulli, Lavon Paganini, PA-C    Family History Family History  Problem Relation Age of Onset   Diabetes Neg Hx     Social History Social History   Tobacco Use   Smoking status: Never   Smokeless tobacco: Never  Vaping Use   Vaping Use:  Never used  Substance Use Topics   Alcohol use: No   Drug use: No     Allergies   Patient has no known allergies.   Review of Systems Review of Systems Per HPI  Physical Exam Triage Vital Signs ED Triage Vitals  Enc Vitals Group     BP 10/01/20 1749 (!) 169/103     Pulse Rate 10/01/20 1749 (!) 104     Resp 10/01/20 1749 17     Temp 10/01/20 1752 99.5 F (37.5 C)     Temp src --      SpO2 10/01/20 1749 100 %     Weight --      Height --      Head Circumference --      Peak Flow --  Pain Score 10/01/20 1749 0     Pain Loc --      Pain Edu? --      Excl. in [place]? --    No data found.  Updated Vital Signs BP (!) 166/97 (BP Location: Left Arm)   Pulse (!) 104   Temp 99.5 F (37.5 C)   Resp 17   SpO2 100%   Visual Acuity Right Eye Distance:   Left Eye Distance:   Bilateral Distance:    Right Eye Near:   Left Eye Near:    Bilateral Near:     Physical Exam Vitals and nursing note reviewed.  Constitutional:      Appearance: Normal appearance.  HENT:     Head: Atraumatic.  Eyes:     Extraocular Movements: Extraocular movements intact.     Conjunctiva/sclera: Conjunctivae normal.  Cardiovascular:     Rate and Rhythm: Normal rate and regular rhythm.  Pulmonary:     Effort: Pulmonary effort is normal.     Breath sounds: Normal breath sounds.  Musculoskeletal:     Cervical back: Normal range of motion and neck supple.     Comments: In wheelchair, which is baseline.  Status post right BKA.  Diffuse left lower extremity edema which she states is his baseline  Skin:    General: Skin is warm and dry.     Findings: Lesion present.     Comments: 1 to 1.25 cm circular hyperpigmented region plantar surface laterally of left foot, tender to palpation.  No current drainage, surrounding erythema  Neurological:     Mental Status: He is oriented to person, place, and time.  Psychiatric:        Mood and Affect: Mood normal.        Thought Content: Thought content  normal.        Judgment: Judgment normal.     UC Treatments / Results  Labs (all labs ordered are listed, but only abnormal results are displayed) Labs Reviewed - No data to display  EKG   Radiology No results found.  Procedures Procedures (including critical care time)  Medications Ordered in UC Medications - No data to display  Initial Impression / Assessment and Plan / UC Course  I have reviewed the triage vital signs and the nursing notes.  Pertinent labs & imaging results that were available during my care of the patient were reviewed by me and considered in my medical decision making (see chart for details).     He states this is how his previous ulcerations began, suspect a pressure ulcer forming.  Given his complicated medical situation and history of significant progressive infections in the lower extremities, will start on doxycycline, Hibiclens and discussed good supportive home wound care, well cushioned shoes that avoid pressure points.  Close PCP follow-up next week recommended for recheck and further management.  Follow-up if worsening in any time.  Final Clinical Impressions(s) / UC Diagnoses   Final diagnoses:  Ulcer of left foot, unspecified ulcer stage [facility])   Discharge Instructions   None    ED Prescriptions     Medication Sig Dispense Auth. Provider   doxycycline (VIBRAMYCIN) 100 MG capsule Take 1 capsule (100 mg total) by mouth 2 (two) times daily. 14 capsule Volney American, [place]   chlorhexidine (HIBICLENS) 4 % external liquid Apply topically daily as needed. 120 mL Volney American, [place]      PDMP not reviewed this encounter.   Volney American, [place] 10/02/20 1808

## 2020-10-06 ENCOUNTER — Ambulatory Visit (INDEPENDENT_AMBULATORY_CARE_PROVIDER_SITE_OTHER): Payer: Self-pay

## 2020-10-06 ENCOUNTER — Ambulatory Visit (INDEPENDENT_AMBULATORY_CARE_PROVIDER_SITE_OTHER): Payer: Self-pay | Admitting: Orthopedic Surgery

## 2020-10-06 ENCOUNTER — Other Ambulatory Visit: Payer: Self-pay

## 2020-10-06 DIAGNOSIS — M79672 Pain in left foot: Secondary | ICD-10-CM

## 2020-10-06 DIAGNOSIS — I70234 Atherosclerosis of native arteries of right leg with ulceration of heel and midfoot: Secondary | ICD-10-CM

## 2020-10-06 NOTE — Progress Notes (Signed)
Office Visit Note   Patient: Patrick [name]           Date of Birth: [birth year]           MRN: [id number] Visit Date: 10/06/2020              Requested by: Pa, [place] [address] [place],  [place] [address] PCP: Pa, [facility]  Chief Complaint  Patient presents with   Left Foot - Pain    ER visit 10/01/20 ulcer       HPI: Patient is a 55 year old gentleman who is seen for a black gangrenous ulcer left heel.  Patient states he was in the emergency room on August 13 he was given a prescription for doxycycline.  Patient's most recent hemoglobin A1c was greater than 15.5 and has decreased to 7.8.  Patient is status post transtibial amputation on the right.  Assessment & Plan: Visit Diagnoses:  1. 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 Pain of left heel     Plan: We will obtain an MRI scan of the left heel to evaluate for possible osteomyelitis.  If the ulcer is superficial then we will continue with routine wound care.  Follow-Up Instructions: Return if symptoms worsen or fail to improve, for Follow-up after the MRI scan left heel.   Ortho Exam  Patient is alert, oriented, no adenopathy, well-dressed, normal affect, normal respiratory effort. Examination patient has swelling of the foot and ankle.  There are some mild ischemic changes in the plantar aspect the left foot there is a large black eschar over the heel with clear drainage this eschar is approximately 5 cm in diameter.  I cannot palpate a pulse secondary to the swelling.  Review of the ankle-brachial indices in July shows an ABI of 1.14 on the left with multiphasic flow at the ankle.  Patient has left ankle-brachial indices are essentially unchanged from his previous studies.  Imaging: XR Os Calcis Left  Result Date: 10/06/2020 2 view radiographs of the left heel shows no destructive bony changes of the calcaneus.  Patient has extensive arthritic  changes through the subtalar talonavicular and midfoot.  There is significant calcification of the arteries extending past the ankle.  No images are attached to the encounter.  Labs: Lab Results  Component Value Date   HGBA1C 7.8 (H) 08/27/2020   HGBA1C >15.5 (H) 06/06/2020   HGBA1C >15.5 (H) 06/04/2020   ESRSEDRATE 95 (H) 09/17/2020   ESRSEDRATE >140 (H) 08/26/2020   ESRSEDRATE 105 (H) 06/04/2020   CRP 19.0 (H) 09/17/2020   CRP 18.3 (H) 08/26/2020   CRP 24.4 (H) 06/04/2020   LABURIC 11.2 (H) 08/29/2020   LABURIC 9.1 (H) 06/09/2020   REPTSTATUS 09/22/2020 FINAL 09/17/2020   GRAMSTAIN  06/06/2020    RARE WBC PRESENT, PREDOMINANTLY PMN ABUNDANT GRAM POSITIVE COCCI RARE GRAM POSITIVE RODS    CULT  09/17/2020    NO GROWTH 5 DAYS Performed at [facility] [place] 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     Lab Results  Component Value Date   ALBUMIN 2.3 (L) 09/26/2020   ALBUMIN 2.5 (L) 09/24/2020   ALBUMIN 2.6 (L) 09/17/2020   PREALBUMIN 8.2 (L) 09/17/2020   PREALBUMIN 5.1 (L) 06/04/2020    Lab Results  Component Value Date   MG 1.3 (L) 09/29/2020   MG 1.3 (L) 06/06/2020   MG 1.7  05/03/2019   No results found for: [facility]  Lab Results  Component Value Date   PREALBUMIN 8.2 (L) 09/17/2020   PREALBUMIN 5.1 (L) 06/04/2020   CBC EXTENDED Latest Ref Rng & Units 09/28/2020 09/27/2020 09/26/2020  WBC 4.0 - 10.5 K/uL 12.4(H) 12.5(H) 16.7(H)  RBC 4.22 - 5.81 MIL/uL 3.02(L) 2.88(L) 2.81(L)  HGB 13.0 - 17.0 g/dL 7.6(L) 7.1(L) 7.1(L)  HCT 39.0 - 52.0 % 25.4(L) 24.5(L) 22.9(L)  PLT 150 - 400 K/uL 585(H) 466(H) 507(H)  NEUTROABS 1.7 - 7.7 K/uL - - -  LYMPHSABS 0.7 - 4.0 K/uL - - -     There is no height or weight on file to calculate BMI.  Orders:  Orders Placed This Encounter  Procedures   XR Os Calcis Left   MR HEEL LEFT WO CONTRAST   No orders of the defined types were placed in this encounter.     Procedures: No procedures performed  Clinical Data: No additional findings.  ROS:  All other systems negative, except as noted in the HPI. Review of Systems  Objective: Vital Signs: There were no vitals taken for this visit.  Specialty Comments:  No specialty comments available.  PMFS History: Patient Active Problem List   Diagnosis Date Noted   Cholelithiasis with acute cholecystitis 09/25/2020   Hypertensive urgency 09/25/2020   Constipation 09/25/2020   Thrombocytosis 09/25/2020   S/P BKA (below knee amputation) unilateral, right ([place]) 09/01/2020   ABLA (acute blood loss anemia) 09/01/2020   Anemia 08/27/2020   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 ([place]) 07/20/2020   Dehiscence of amputation stump ([place]) 07/20/2020   Cutaneous abscess of right foot    Osteomyelitis of right foot (HCC)    Severe protein-calorie malnutrition ([place])    Sepsis due to cellulitis (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) 06/04/2020   Acute respiratory disease due to COVID-19 virus 05/03/2019   Renal insufficiency 05/03/2019   GOUT 01/27/2010   OBESITY 01/27/2010   Insulin-requiring or dependent type II diabetes mellitus ([place]) 02/20/2008   HYPERTENSION, BENIGN ESSENTIAL 02/20/2008   Past Medical History:  Diagnosis Date   Anemia    Diabetes mellitus    Gout    History of COVID-19 04/2019   Hypertension    PVD (peripheral vascular disease) ([place])    s/p R BKA    Family History  Problem Relation Age of Onset   Diabetes Neg Hx     Past Surgical History:  Procedure Laterality Date   AMPUTATION Right 06/06/2020   Procedure: AMPUTATION 5TH RAY;  Surgeon: [name], MD;  Location: [place];  Service: Orthopedics;  Laterality: Right;   AMPUTATION Right 06/08/2020   Procedure: RIGHT 4TH RAY AMPUTATION;   Surgeon: [name], MD;  Location: [place];  Service: Orthopedics;  Laterality: Right;   AMPUTATION Right 08/27/2020   Procedure: AMPUTATION BELOW KNEE;  Surgeon: Jessy Oto, MD;  Location: [place];  Service: Orthopedics;  Laterality: Right;   CHOLECYSTECTOMY N/A 09/25/2020   Procedure: LAPAROSCOPIC CHOLECYSTECTOMY;  Surgeon: Jesusita Oka, MD;  [facility];  Service: General;  Laterality: N/A;   Social History   Occupational History   Occupation: logistics  Tobacco Use   Smoking status: Never   Smokeless tobacco: Never  Vaping Use   Vaping Use: Never used  Substance and Sexual Activity   Alcohol use: No   Drug use: No   Sexual activity: Not Currently

## 2020-10-22 ENCOUNTER — Other Ambulatory Visit: Payer: Self-pay

## 2020-10-31 ENCOUNTER — Ambulatory Visit
Admission: RE | Admit: 2020-10-31 | Discharge: 2020-10-31 | Disposition: A | Discharge status: Home / Self Care | Payer: Self-pay | Source: Ambulatory Visit | Attending: Orthopedic Surgery | Admitting: Orthopedic Surgery

## 2020-10-31 ENCOUNTER — Other Ambulatory Visit: Payer: Self-pay

## 2020-10-31 DIAGNOSIS — I70234 Atherosclerosis of native arteries of right leg with ulceration of heel and midfoot: Secondary | ICD-10-CM

## 2020-11-03 ENCOUNTER — Ambulatory Visit (INDEPENDENT_AMBULATORY_CARE_PROVIDER_SITE_OTHER): Payer: Self-pay | Admitting: Orthopedic Surgery

## 2020-11-03 ENCOUNTER — Other Ambulatory Visit: Payer: Self-pay

## 2020-11-03 DIAGNOSIS — Z89511 Acquired absence of right leg below knee: Secondary | ICD-10-CM

## 2020-11-03 DIAGNOSIS — M79672 Pain in left foot: Secondary | ICD-10-CM

## 2020-11-03 MED ORDER — FUROSEMIDE 20 MG PO TABS: 20.0000 mg | ORAL_TABLET | Freq: Every day | ORAL | 0 refills | Status: DC | PRN

## 2020-11-07 ENCOUNTER — Encounter: Payer: Self-pay | Admitting: Orthopedic Surgery

## 2020-11-07 NOTE — Progress Notes (Signed)
Office Visit Note   Patient: Patrick [name]           Date of Birth: [birth year]           MRN: [id number] Visit Date: 11/03/2020              Requested by: [facility] [address] [place],  [place] [address] PCP: [place]  Chief Complaint  Patient presents with   Left Foot - Follow-up    MRI heel       HPI: Patient is a 55 year old gentleman who is status post right TKA who also presents status post MRI scan of his left heel.  Assessment & Plan: Visit Diagnoses:  1. Pain of left heel   2. Hx of BKA, right ([place])     Plan: A prescription was called in to refill the patient's Lasix 20 mg a day.  Will proceed with prosthetic fitting on the right and address the left heel once he is stable with weightbearing on the right.  Follow-Up Instructions: Return in about 4 weeks (around 12/01/2020).   Ortho Exam  Patient is alert, oriented, no adenopathy, well-dressed, normal affect, normal respiratory effort. Review of the MRI scan left heel shows edema in the calcaneus suggestive of early osteomyelitis.  Right transtibial amputation is healing slowly.  The left heel eschar is flat there is no cellulitis no abscess no drainage.  Imaging: No results found. No images are attached to the encounter.  Labs: Lab Results  Component Value Date   HGBA1C 7.8 (H) 08/27/2020   HGBA1C >15.5 (H) 06/06/2020   HGBA1C >15.5 (H) 06/04/2020   ESRSEDRATE 95 (H) 09/17/2020   ESRSEDRATE >140 (H) 08/26/2020   ESRSEDRATE 105 (H) 06/04/2020   CRP 19.0 (H) 09/17/2020   CRP 18.3 (H) 08/26/2020   CRP 24.4 (H) 06/04/2020   LABURIC 11.2 (H) 08/29/2020   LABURIC 9.1 (H) 06/09/2020   REPTSTATUS 09/22/2020 FINAL 09/17/2020   GRAMSTAIN  06/06/2020    RARE WBC PRESENT, PREDOMINANTLY PMN ABUNDANT GRAM POSITIVE COCCI RARE GRAM POSITIVE RODS    CULT  09/17/2020    NO GROWTH 5 DAYS Performed at [facility], [place] [address], [place], [place] [address]    LABORGA VANCOMYCIN  RESISTANT ENTEROCOCCUS (A) 08/26/2020     Lab Results  Component Value Date   ALBUMIN 2.3 (L) 09/26/2020   ALBUMIN 2.5 (L) 09/24/2020   ALBUMIN 2.6 (L) 09/17/2020   PREALBUMIN 8.2 (L) 09/17/2020   PREALBUMIN 5.1 (L) 06/04/2020    Lab Results  Component Value Date   MG 1.3 (L) 09/29/2020   MG 1.3 (L) 06/06/2020   MG 1.7 05/03/2019   No results found for: VD25OH  Lab Results  Component Value Date   PREALBUMIN 8.2 (L) 09/17/2020   PREALBUMIN 5.1 (L) 06/04/2020   CBC EXTENDED Latest Ref Rng & Units 09/28/2020 09/27/2020 09/26/2020  WBC 4.0 - 10.5 K/uL 12.4(H) 12.5(H) 16.7(H)  RBC 4.22 - 5.81 MIL/uL 3.02(L) 2.88(L) 2.81(L)  HGB 13.0 - 17.0 g/dL 7.6(L) 7.1(L) 7.1(L)  HCT 39.0 - 52.0 % 25.4(L) 24.5(L) 22.9(L)  PLT 150 - 400 K/uL 585(H) 466(H) 507(H)  NEUTROABS 1.7 - 7.7 K/uL - - -  LYMPHSABS 0.7 - 4.0 K/uL - - -     There is no height or weight on file to calculate BMI.  Orders:  No orders of the defined types were placed in this encounter.  Meds ordered this encounter  Medications   furosemide (LASIX) 20 MG  tablet    Sig: Take 1 tablet (20 mg total) by mouth daily as needed.    Dispense:  30 tablet    Refill:  0     Procedures: No procedures performed  Clinical Data: No additional findings.  ROS:  All other systems negative, except as noted in the HPI. Review of Systems  Objective: Vital Signs: There were no vitals taken for this visit.  Specialty Comments:  No specialty comments available.  PMFS History: Patient Active Problem List   Diagnosis Date Noted   Cholelithiasis with acute cholecystitis 09/25/2020   Hypertensive urgency 09/25/2020   Constipation 09/25/2020   Thrombocytosis 09/25/2020   S/P BKA (below knee amputation) unilateral, right ([place]) 09/01/2020   ABLA (acute blood loss anemia) 09/01/2020   Anemia 08/27/2020   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 ([place]) 07/20/2020   Dehiscence of amputation stump ([place])  07/20/2020   Cutaneous abscess of right foot    Osteomyelitis of right foot (HCC)    Severe protein-calorie malnutrition ([place])    Sepsis due to cellulitis ([place]) 06/04/2020   Diabetic foot ulcer ([place]) 06/04/2020   Diabetic foot infection ([place]) 06/04/2020   CKD (chronic kidney disease) stage 3, GFR 30-59 ml/min ([place]) 06/04/2020   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[place]) 06/04/2020   Acute respiratory disease due to COVID-19 virus 05/03/2019   Renal insufficiency 05/03/2019   GOUT 01/27/2010   OBESITY 01/27/2010   Insulin-requiring or dependent type II diabetes mellitus ([place]) 02/20/2008   HYPERTENSION, BENIGN ESSENTIAL 02/20/2008   Past Medical History:  Diagnosis Date   Anemia    Diabetes mellitus    Gout    History of COVID-19 04/2019   Hypertension    PVD (peripheral vascular disease) ([place])    s/p R BKA    Family History  Problem Relation Age of Onset   Diabetes Neg Hx     Past Surgical History:  Procedure Laterality Date   AMPUTATION Right 06/06/2020   Procedure: AMPUTATION 5TH RAY;  Surgeon: [name], MD;  Location: [place];  Service: Orthopedics;  Laterality: Right;   AMPUTATION Right 06/08/2020   Procedure: RIGHT 4TH RAY AMPUTATION;  Surgeon: [name], MD;  Location: [place];  Service: Orthopedics;  Laterality: Right;   AMPUTATION Right 08/27/2020   Procedure: AMPUTATION BELOW KNEE;  Surgeon: Jessy Oto, MD;  Location: [place];  Service: Orthopedics;  Laterality: Right;   CHOLECYSTECTOMY N/A 09/25/2020   Procedure: LAPAROSCOPIC CHOLECYSTECTOMY;  Surgeon: Jesusita Oka, MD;  [facility];  Service: General;  Laterality: N/A;   Social History   Occupational History   Occupation: logistics  Tobacco Use   Smoking status: Never   Smokeless tobacco: Never  Vaping Use   Vaping Use: Never used  Substance and Sexual Activity   Alcohol use: No   Drug use: No   Sexual activity: Not Currently

## 2020-12-15 ENCOUNTER — Ambulatory Visit (INDEPENDENT_AMBULATORY_CARE_PROVIDER_SITE_OTHER): Payer: Self-pay | Admitting: Orthopedic Surgery

## 2020-12-15 DIAGNOSIS — Z89511 Acquired absence of right leg below knee: Secondary | ICD-10-CM

## 2020-12-15 DIAGNOSIS — M79672 Pain in left foot: Secondary | ICD-10-CM

## 2020-12-15 DIAGNOSIS — I739 Peripheral vascular disease, unspecified: Secondary | ICD-10-CM

## 2020-12-15 DIAGNOSIS — M86272 Subacute osteomyelitis, left ankle and foot: Secondary | ICD-10-CM

## 2020-12-15 MED ORDER — DOXYCYCLINE HYCLATE 100 MG PO TABS: 100.0000 mg | ORAL_TABLET | Freq: Two times a day (BID) | ORAL | 0 refills | Status: DC

## 2020-12-19 ENCOUNTER — Encounter: Payer: Self-pay | Admitting: Physical Medicine and Rehabilitation

## 2020-12-27 ENCOUNTER — Other Ambulatory Visit: Payer: Self-pay | Admitting: Orthopedic Surgery

## 2020-12-27 ENCOUNTER — Encounter: Payer: Self-pay | Admitting: Orthopedic Surgery

## 2020-12-27 DIAGNOSIS — M86272 Subacute osteomyelitis, left ankle and foot: Secondary | ICD-10-CM

## 2020-12-27 MED ORDER — KETAMINE BOLUS VIA INFUSION: 0.5000 mg/kg | Freq: Once | INTRAVENOUS | Status: AC

## 2020-12-27 NOTE — Progress Notes (Signed)
Office Visit Note   Patient: Patrick [name]           Date of Birth: [birth year]           MRN: [id number] Visit Date: 12/15/2020              Requested by: Pa, [place] [address] [place],  [place] [address] PCP: Pa, [facility]  Chief Complaint  Patient presents with   Left Foot - Follow-up      HPI: Patient is a 55 year old gentleman essentially 4 months status post right transtibial amputation with ulcer and osteomyelitis of the left heel.  Patient denies any fever or chills.  He states he does have odor swelling and drainage from the left heel.  Patient reports a history of maggots.  Assessment & Plan: Visit Diagnoses:  1. Hx of BKA, right ([place])   2. Pain of left heel   3. PVD (peripheral vascular disease) ([place])   4. Subacute osteomyelitis of left foot ([place])     Plan: Patient is being fit for a prosthesis on the right in 1 month.  He states he would like to proceed with a left transtibial amputation in several weeks.  Follow-Up Instructions: Return in about 2 weeks (around 12/29/2020).   Ortho Exam  Patient is alert, oriented, no adenopathy, well-dressed, normal affect, normal respiratory effort. Examination the left heel has extensive necrotic tissue with exposed bone involving the plantar midfoot and hindfoot.  Patient has a stable right transtibial amputation.  MRI scan of the left heel shows extensive osteomyelitis of the hindfoot.  Imaging: No results found.      Labs: Lab Results  Component Value Date   HGBA1C 7.8 (H) 08/27/2020   HGBA1C >15.5 (H) 06/06/2020   HGBA1C >15.5 (H) 06/04/2020   ESRSEDRATE 95 (H) 09/17/2020   ESRSEDRATE >140 (H) 08/26/2020   ESRSEDRATE 105 (H) 06/04/2020   CRP 19.0 (H) 09/17/2020   CRP 18.3 (H) 08/26/2020   CRP 24.4 (H) 06/04/2020   LABURIC 11.2 (H) 08/29/2020   LABURIC 9.1 (H) 06/09/2020   REPTSTATUS 09/22/2020 FINAL 09/17/2020   GRAMSTAIN  06/06/2020    RARE WBC PRESENT, PREDOMINANTLY PMN ABUNDANT GRAM  POSITIVE COCCI RARE GRAM POSITIVE RODS    CULT  09/17/2020    NO GROWTH 5 DAYS Performed at [facility], [place] 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     Lab Results  Component Value Date   ALBUMIN 2.3 (L) 09/26/2020   ALBUMIN 2.5 (L) 09/24/2020   ALBUMIN 2.6 (L) 09/17/2020   PREALBUMIN 8.2 (L) 09/17/2020   PREALBUMIN 5.1 (L) 06/04/2020    Lab Results  Component Value Date   MG 1.3 (L) 09/29/2020   MG 1.3 (L) 06/06/2020   MG 1.7 05/03/2019   No results found for: VD25OH  Lab Results  Component Value Date   PREALBUMIN 8.2 (L) 09/17/2020   PREALBUMIN 5.1 (L) 06/04/2020   CBC EXTENDED Latest Ref Rng & Units 09/28/2020 09/27/2020 09/26/2020  WBC 4.0 - 10.5 K/uL 12.4(H) 12.5(H) 16.7(H)  RBC 4.22 - 5.81 MIL/uL 3.02(L) 2.88(L) 2.81(L)  HGB 13.0 - 17.0 g/dL 7.6(L) 7.1(L) 7.1(L)  HCT 39.0 - 52.0 % 25.4(L) 24.5(L) 22.9(L)  PLT 150 - 400 K/uL 585(H) 466(H) 507(H)  NEUTROABS 1.7 - 7.7 K/uL - - -  LYMPHSABS 0.7 - 4.0 K/uL - - -     There is no height or weight on file to calculate BMI.  Orders:  No orders of the defined types were placed in this encounter.  Meds ordered this encounter  Medications   doxycycline (VIBRA-TABS) 100 MG tablet    Sig: Take 1 tablet (100 mg total) by mouth 2 (two) times daily.    Dispense:  60 tablet    Refill:  0     Procedures: No procedures performed  Clinical Data: No additional findings.  ROS:  All other systems negative, except as noted in the HPI. Review of Systems  Objective: Vital Signs: There were no vitals taken for this visit.  Specialty Comments:  No specialty comments available.  PMFS History: Patient Active Problem List   Diagnosis Date Noted   Cholelithiasis with acute cholecystitis 09/25/2020   Hypertensive urgency 09/25/2020   Constipation 09/25/2020   Thrombocytosis 09/25/2020   S/P BKA (below knee amputation) unilateral, right ([place]) 09/01/2020    ABLA (acute blood loss anemia) 09/01/2020   Anemia 08/27/2020   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 ([place]) 07/20/2020   Dehiscence of amputation stump ([place]) 07/20/2020   Cutaneous abscess of right foot    Osteomyelitis of right foot ([facility])    Severe protein-calorie malnutrition ([place])    Sepsis due to cellulitis ([place]) 06/04/2020   Diabetic foot ulcer ([place]) 06/04/2020   Diabetic foot infection ([place]) 06/04/2020   CKD (chronic kidney disease) stage 3, GFR 30-59 ml/min ([place]) 06/04/2020   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[place]) 06/04/2020   Acute respiratory disease due to COVID-19 virus 05/03/2019   Renal insufficiency 05/03/2019   GOUT 01/27/2010   OBESITY 01/27/2010   Insulin-requiring or dependent type II diabetes mellitus ([place]) 02/20/2008   HYPERTENSION, BENIGN ESSENTIAL 02/20/2008   Past Medical History:  Diagnosis Date   Anemia    Diabetes mellitus    Gout    History of COVID-19 04/2019   Hypertension    PVD (peripheral vascular disease) ([place])    s/p R BKA    Family History  Problem Relation Age of Onset   Diabetes Neg Hx     Past Surgical History:  Procedure Laterality Date   AMPUTATION Right 06/06/2020   Procedure: AMPUTATION 5TH RAY;  Surgeon: [name], MD;  Location: [facility];  Service: Orthopedics;  Laterality: Right;   AMPUTATION Right 06/08/2020   Procedure: RIGHT 4TH RAY AMPUTATION;  Surgeon: [name], MD;  Location: [facility];  Service: Orthopedics;  Laterality: Right;   AMPUTATION Right 08/27/2020   Procedure: AMPUTATION BELOW KNEE;  Surgeon: Jessy Oto, MD;  Location: [facility];  Service: Orthopedics;  Laterality: Right;   CHOLECYSTECTOMY N/A 09/25/2020   Procedure: LAPAROSCOPIC CHOLECYSTECTOMY;  Surgeon: Jesusita Oka, MD;  Location: [facility];  Service: General;  Laterality: N/A;   Social History   Occupational History   Occupation: logistics  Tobacco Use   Smoking status: Never   Smokeless tobacco: Never  Vaping Use   Vaping Use: Never  used  Substance and Sexual Activity   Alcohol use: No   Drug use: No   Sexual activity: Not Currently

## 2020-12-29 ENCOUNTER — Other Ambulatory Visit: Payer: Self-pay

## 2020-12-29 ENCOUNTER — Encounter (HOSPITAL_COMMUNITY): Payer: Self-pay | Admitting: Orthopedic Surgery

## 2020-12-29 MED ORDER — TRANEXAMIC ACID 1000 MG/10ML IV SOLN
2000.0000 mg | INTRAVENOUS | Status: DC
  Filled 2020-12-29: qty 20

## 2020-12-29 NOTE — Progress Notes (Signed)
PCP - Dr. Florene Glen ([facility]) Cardiologist - denies EKG - 09/14/20 Chest x-ray - 09/17/20 ECHO -  Cardiac Cath -  CPAP -   Fasting Blood Sugar:  112-230 Checks Blood Sugar:  3-3x/day  Blood Thinner Instructions:  Aspirin Instructions:   ERAS Protcol - n/a  COVID TEST- DOS   Anesthesia review: n/a  -------------  SDW INSTRUCTIONS:  Your procedure is scheduled on Friday 11/11. Please report to [facility] Main Entrance "A" at 0900 A.M., and check in at the Admitting office. Call this number if you have problems the morning of surgery: [phone]   Remember: Do not eat or drink after midnight the night before your surgery  Medications to take morning of surgery with a sip of water include: acetaminophen (TYLENOL) gabapentin (NEURONTIN)  metoprolol tartrate (LOPRESSOR)  ** PLEASE check your blood sugar the morning of your surgery when you wake up and every 2 hours until you get to the Short Stay unit.  If your blood sugar is less than 70 mg/dL, you will need to treat for low blood sugar: Do not take insulin. Treat a low blood sugar (less than 70 mg/dL) with  cup of clear juice (cranberry or apple), 4 glucose tablets, OR glucose gel. Recheck blood sugar in 15 minutes after treatment (to make sure it is greater than 70 mg/dL). If your blood sugar is not greater than 70 mg/dL on recheck, call [phone] for further instructions.   11/10: Glargine AM 25units, glargine PM 12.5, Humalog AM usual, humalog PM NONE, Metformin take as usual  11/11: glargine AM 12.5, humalog NONE, if CBG >220 take half usual dose, NO metformin   As of today, STOP taking any Aspirin (unless otherwise instructed by your surgeon), Aleve, Naproxen, Ibuprofen, Motrin, Advil, Goody's, BC's, all herbal medications, fish oil, and all vitamins.    The Morning of Surgery Do not wear jewelry Do not wear lotions, powders, colognes, or deodorant  Do not bring valuables to the hospital. [facility] is  not responsible for any belongings or valuables.  If you are a smoker, DO NOT Smoke 24 hours prior to surgery  If you wear a CPAP at night please bring your mask the morning of surgery   Remember that you must have someone to transport you home after your surgery, and remain with you for 24 hours if you are discharged the same day.  Please bring cases for contacts, glasses, hearing aids, dentures or bridgework because it cannot be worn into surgery.   Patients discharged the day of surgery will not be allowed to drive home.   Please shower the NIGHT BEFORE/MORNING OF SURGERY (use antibacterial soap like DIAL soap if possible). Wear comfortable clothes the morning of surgery. Oral Hygiene is also important to reduce your risk of infection.  Remember - BRUSH YOUR TEETH THE MORNING OF SURGERY WITH YOUR REGULAR TOOTHPASTE  Patient denies shortness of breath, fever, cough and chest pain.

## 2020-12-29 NOTE — Anesthesia Preprocedure Evaluation (Addendum)
Anesthesia Evaluation  Patient identified by MRN, date of birth, ID band Patient awake    Reviewed: Allergy & Precautions, NPO status , Patient's Chart, lab work & pertinent test results  Airway Mallampati: II  TM Distance: >3 FB Neck ROM: Full    Dental no notable dental hx. (+) Teeth Intact, Dental Advisory Given   Pulmonary neg pulmonary ROS,    Pulmonary exam normal breath sounds clear to auscultation       Cardiovascular hypertension, Pt. on medications + Peripheral Vascular Disease  Normal cardiovascular exam Rhythm:Regular Rate:Normal     Neuro/Psych negative neurological ROS  negative psych ROS   GI/Hepatic   Endo/Other  diabetes, Insulin Dependent  Renal/GU Renal diseaseLab Results      Component                Value               Date                      CREATININE               1.29 (H)            09/29/2020                BUN                      14                  09/29/2020                NA                       139                 09/29/2020                K                        4.1                 09/29/2020                CL                       106                 09/29/2020                CO2                      23                  09/29/2020                Musculoskeletal   Abdominal (+) + obese (BMI 36.52),   Peds  Hematology  (+) anemia , Lab Results      Component                Value               Date                      WBC  12.4 (H)            09/28/2020                HGB                      7.6 (L)             09/28/2020                HCT                      25.4 (L)            09/28/2020                MCV                      84.1                09/28/2020                PLT                      585 (H)             09/28/2020              Anesthesia Other Findings   Reproductive/Obstetrics                             Anesthesia Physical Anesthesia Plan  ASA: 3  Anesthesia Plan: Regional   Post-op Pain Management:    Induction:   PONV Risk Score and Plan: 3 and Treatment may vary due to age or medical condition, Midazolam and Ondansetron  Airway Management Planned: Nasal Cannula and Natural Airway  Additional Equipment: None  Intra-op Plan:   Post-operative Plan:   Informed Consent: I have reviewed the patients History and Physical, chart, labs and discussed the procedure including the risks, benefits and alternatives for the proposed anesthesia with the patient or authorized representative who has indicated his/her understanding and acceptance.     Dental advisory given  Plan Discussed with: CRNA and Anesthesiologist  Anesthesia Plan Comments: (L Pop/Sciatic + L Adductor canal plus MAC)       Anesthesia Quick Evaluation

## 2020-12-30 ENCOUNTER — Encounter (HOSPITAL_COMMUNITY): Payer: Self-pay | Admitting: Orthopedic Surgery

## 2020-12-30 ENCOUNTER — Inpatient Hospital Stay (HOSPITAL_COMMUNITY): Payer: Medicaid Other | Admitting: Anesthesiology

## 2020-12-30 ENCOUNTER — Encounter (HOSPITAL_COMMUNITY)
Admission: RE | Disposition: A | Discharge status: Skilled Nursing Facility | Payer: Self-pay | Source: Home / Self Care | Attending: Orthopedic Surgery

## 2020-12-30 ENCOUNTER — Inpatient Hospital Stay (HOSPITAL_COMMUNITY)
Admission: RE | Admit: 2020-12-30 | Discharge: 2021-01-11 | DRG: 617 | Disposition: A | Discharge status: Skilled Nursing Facility | Payer: Medicaid Other | Attending: Orthopedic Surgery | Admitting: Orthopedic Surgery

## 2020-12-30 ENCOUNTER — Other Ambulatory Visit: Payer: Self-pay

## 2020-12-30 DIAGNOSIS — L97529 Non-pressure chronic ulcer of other part of left foot with unspecified severity: Secondary | ICD-10-CM | POA: Diagnosis present

## 2020-12-30 DIAGNOSIS — D62 Acute posthemorrhagic anemia: Secondary | ICD-10-CM | POA: Diagnosis not present

## 2020-12-30 DIAGNOSIS — Z79899 Other long term (current) drug therapy: Secondary | ICD-10-CM | POA: Diagnosis not present

## 2020-12-30 DIAGNOSIS — Z8616 Personal history of COVID-19: Secondary | ICD-10-CM | POA: Diagnosis not present

## 2020-12-30 DIAGNOSIS — M86672 Other chronic osteomyelitis, left ankle and foot: Secondary | ICD-10-CM | POA: Diagnosis present

## 2020-12-30 DIAGNOSIS — Z20822 Contact with and (suspected) exposure to covid-19: Secondary | ICD-10-CM | POA: Diagnosis present

## 2020-12-30 DIAGNOSIS — Z7984 Long term (current) use of oral hypoglycemic drugs: Secondary | ICD-10-CM | POA: Diagnosis not present

## 2020-12-30 DIAGNOSIS — R5383 Other fatigue: Secondary | ICD-10-CM | POA: Diagnosis not present

## 2020-12-30 DIAGNOSIS — I10 Essential (primary) hypertension: Secondary | ICD-10-CM | POA: Diagnosis present

## 2020-12-30 DIAGNOSIS — Z794 Long term (current) use of insulin: Secondary | ICD-10-CM | POA: Diagnosis not present

## 2020-12-30 DIAGNOSIS — M86272 Subacute osteomyelitis, left ankle and foot: Secondary | ICD-10-CM

## 2020-12-30 DIAGNOSIS — D539 Nutritional anemia, unspecified: Secondary | ICD-10-CM | POA: Diagnosis present

## 2020-12-30 DIAGNOSIS — E1151 Type 2 diabetes mellitus with diabetic peripheral angiopathy without gangrene: Secondary | ICD-10-CM | POA: Diagnosis present

## 2020-12-30 DIAGNOSIS — E1169 Type 2 diabetes mellitus with other specified complication: Principal | ICD-10-CM | POA: Diagnosis present

## 2020-12-30 DIAGNOSIS — E11621 Type 2 diabetes mellitus with foot ulcer: Secondary | ICD-10-CM | POA: Diagnosis present

## 2020-12-30 DIAGNOSIS — M109 Gout, unspecified: Secondary | ICD-10-CM | POA: Diagnosis present

## 2020-12-30 DIAGNOSIS — Z89512 Acquired absence of left leg below knee: Secondary | ICD-10-CM | POA: Diagnosis present

## 2020-12-30 DIAGNOSIS — Z89511 Acquired absence of right leg below knee: Secondary | ICD-10-CM

## 2020-12-30 DIAGNOSIS — L03116 Cellulitis of left lower limb: Secondary | ICD-10-CM | POA: Diagnosis present

## 2020-12-30 HISTORY — PX: AMPUTATION: SHX166

## 2020-12-30 LAB — CBC
HCT: 22 % — ABNORMAL LOW (ref 39.0–52.0)
Hemoglobin: 6.4 g/dL — CL (ref 13.0–17.0)
MCH: 22.8 pg — ABNORMAL LOW (ref 26.0–34.0)
MCHC: 29.1 g/dL — ABNORMAL LOW (ref 30.0–36.0)
MCV: 78.3 fL — ABNORMAL LOW (ref 80.0–100.0)
Platelets: 601 10*3/uL — ABNORMAL HIGH (ref 150–400)
RBC: 2.81 MIL/uL — ABNORMAL LOW (ref 4.22–5.81)
RDW: 19.1 % — ABNORMAL HIGH (ref 11.5–15.5)
WBC: 14.7 10*3/uL — ABNORMAL HIGH (ref 4.0–10.5)
nRBC: 0 % (ref 0.0–0.2)

## 2020-12-30 LAB — BASIC METABOLIC PANEL
Anion gap: 11 (ref 5–15)
BUN: 12 mg/dL (ref 6–20)
CO2: 23 mmol/L (ref 22–32)
Calcium: 8.4 mg/dL — ABNORMAL LOW (ref 8.9–10.3)
Chloride: 101 mmol/L (ref 98–111)
Creatinine, Ser: 1.21 mg/dL (ref 0.61–1.24)
GFR, Estimated: >60 mL/min (ref >60)
Glucose, Bld: 167 mg/dL — ABNORMAL HIGH (ref 70–99)
Potassium: 3.6 mmol/L (ref 3.5–5.1)
Sodium: 135 mmol/L (ref 135–145)

## 2020-12-30 LAB — SARS CORONAVIRUS 2 BY RT PCR (HOSPITAL ORDER, PERFORMED IN ~~LOC~~ HOSPITAL LAB): SARS Coronavirus 2: NEGATIVE

## 2020-12-30 LAB — GLUCOSE, CAPILLARY
Glucose-Capillary: 179 mg/dL — ABNORMAL HIGH (ref 70–99)
Glucose-Capillary: 187 mg/dL — ABNORMAL HIGH (ref 70–99)
Glucose-Capillary: 225 mg/dL — ABNORMAL HIGH (ref 70–99)
Glucose-Capillary: 231 mg/dL — ABNORMAL HIGH (ref 70–99)

## 2020-12-30 LAB — SURGICAL PCR SCREEN
MRSA, PCR: NEGATIVE
Staphylococcus aureus: NEGATIVE

## 2020-12-30 LAB — PREPARE RBC (CROSSMATCH)

## 2020-12-30 SURGERY — AMPUTATION BELOW KNEE
Anesthesia: Regional | Site: Knee | Laterality: Left

## 2020-12-30 MED ORDER — PHENYLEPHRINE HCL (PRESSORS) 10 MG/ML IV SOLN
INTRAVENOUS | Status: DC | PRN
  Administered 2020-12-30: 200 ug via INTRAVENOUS

## 2020-12-30 MED ORDER — ORAL CARE MOUTH RINSE: 15.0000 mL | Freq: Once | OROMUCOSAL | Status: AC

## 2020-12-30 MED ORDER — MIDAZOLAM HCL 2 MG/2ML IJ SOLN: 2.0000 mg | Freq: Once | INTRAMUSCULAR | Status: AC

## 2020-12-30 MED ORDER — HYDROMORPHONE HCL 1 MG/ML IJ SOLN
INTRAMUSCULAR | Status: AC
  Filled 2020-12-30: qty 1

## 2020-12-30 MED ORDER — INSULIN ASPART 100 UNIT/ML IJ SOLN
0.0000 [IU] | Freq: Three times a day (TID) | INTRAMUSCULAR | Status: DC
  Administered 2020-12-31: 4 [IU] via SUBCUTANEOUS
  Administered 2021-01-01 – 2021-01-06 (×3): 3 [IU] via SUBCUTANEOUS
  Administered 2021-01-06: 4 [IU] via SUBCUTANEOUS
  Administered 2021-01-09 – 2021-01-10 (×2): 3 [IU] via SUBCUTANEOUS

## 2020-12-30 MED ORDER — ONDANSETRON HCL 4 MG/2ML IJ SOLN: 4.0000 mg | Freq: Four times a day (QID) | INTRAMUSCULAR | Status: DC | PRN

## 2020-12-30 MED ORDER — ZOLPIDEM TARTRATE 5 MG PO TABS
5.0000 mg | ORAL_TABLET | Freq: Every evening | ORAL | Status: DC | PRN
  Administered 2021-01-01 – 2021-01-09 (×4): 5 mg via ORAL
  Filled 2020-12-30 (×6): qty 1

## 2020-12-30 MED ORDER — CEFAZOLIN SODIUM-DEXTROSE 2-4 GM/100ML-% IV SOLN
2.0000 g | Freq: Three times a day (TID) | INTRAVENOUS | Status: AC
  Administered 2020-12-30 – 2020-12-31 (×2): 2 g via INTRAVENOUS
  Filled 2020-12-30 (×2): qty 100

## 2020-12-30 MED ORDER — AMISULPRIDE (ANTIEMETIC) 5 MG/2ML IV SOLN: 10.0000 mg | Freq: Once | INTRAVENOUS | Status: DC | PRN

## 2020-12-30 MED ORDER — FENTANYL CITRATE (PF) 100 MCG/2ML IJ SOLN: 100.0000 ug | Freq: Once | INTRAMUSCULAR | Status: AC

## 2020-12-30 MED ORDER — GUAIFENESIN-DM 100-10 MG/5ML PO SYRP: 15.0000 mL | ORAL_SOLUTION | ORAL | Status: DC | PRN

## 2020-12-30 MED ORDER — OXYCODONE HCL 5 MG PO TABS
10.0000 mg | ORAL_TABLET | ORAL | Status: DC | PRN
  Administered 2020-12-30 – 2020-12-31 (×3): 10 mg via ORAL
  Administered 2020-12-31 – 2021-01-11 (×22): 15 mg via ORAL
  Filled 2020-12-30 (×6): qty 3
  Filled 2020-12-30: qty 2
  Filled 2020-12-30 (×2): qty 3
  Filled 2020-12-30: qty 2
  Filled 2020-12-30 (×10): qty 3
  Filled 2020-12-30: qty 2
  Filled 2020-12-30 (×4): qty 3

## 2020-12-30 MED ORDER — KETAMINE HCL 10 MG/ML IJ SOLN
INTRAMUSCULAR | Status: DC | PRN
  Administered 2020-12-30: 25 mg via INTRAVENOUS

## 2020-12-30 MED ORDER — LACTATED RINGERS IV SOLN: INTRAVENOUS | Status: DC

## 2020-12-30 MED ORDER — HYDRALAZINE HCL 20 MG/ML IJ SOLN
5.0000 mg | INTRAMUSCULAR | Status: DC | PRN
  Administered 2021-01-09: 5 mg via INTRAVENOUS
  Filled 2020-12-30 (×2): qty 1

## 2020-12-30 MED ORDER — POLYETHYLENE GLYCOL 3350 17 G PO PACK: 17.0000 g | PACK | Freq: Every day | ORAL | Status: DC | PRN

## 2020-12-30 MED ORDER — METOPROLOL TARTRATE 12.5 MG HALF TABLET
ORAL_TABLET | ORAL | Status: AC
  Filled 2020-12-30: qty 2

## 2020-12-30 MED ORDER — METOPROLOL TARTRATE 5 MG/5ML IV SOLN: 2.0000 mg | INTRAVENOUS | Status: DC | PRN

## 2020-12-30 MED ORDER — DEXMEDETOMIDINE (PRECEDEX) IN NS 20 MCG/5ML (4 MCG/ML) IV SYRINGE
PREFILLED_SYRINGE | INTRAVENOUS | Status: DC | PRN
  Administered 2020-12-30: 8 ug via INTRAVENOUS

## 2020-12-30 MED ORDER — ACETAMINOPHEN 325 MG PO TABS
325.0000 mg | ORAL_TABLET | Freq: Four times a day (QID) | ORAL | Status: DC | PRN
  Administered 2020-12-31 – 2021-01-11 (×8): 650 mg via ORAL
  Filled 2020-12-30 (×8): qty 2

## 2020-12-30 MED ORDER — JUVEN PO PACK
1.0000 | PACK | Freq: Two times a day (BID) | ORAL | Status: DC
  Administered 2020-12-31 – 2021-01-07 (×12): 1 via ORAL
  Filled 2020-12-30 (×15): qty 1

## 2020-12-30 MED ORDER — METOPROLOL TARTRATE 12.5 MG HALF TABLET
12.5000 mg | ORAL_TABLET | Freq: Two times a day (BID) | ORAL | Status: DC
  Administered 2020-12-30 – 2021-01-11 (×24): 12.5 mg via ORAL
  Filled 2020-12-30 (×24): qty 1

## 2020-12-30 MED ORDER — SODIUM CHLORIDE 0.9 % IV SOLN: INTRAVENOUS | Status: DC

## 2020-12-30 MED ORDER — POVIDONE-IODINE 10 % EX SWAB: 2.0000 "application " | Freq: Once | CUTANEOUS | Status: DC

## 2020-12-30 MED ORDER — TRANEXAMIC ACID-NACL 1000-0.7 MG/100ML-% IV SOLN
1000.0000 mg | INTRAVENOUS | Status: AC
  Administered 2020-12-30: 1000 mg via INTRAVENOUS
  Filled 2020-12-30: qty 100

## 2020-12-30 MED ORDER — CEFAZOLIN IN SODIUM CHLORIDE 3-0.9 GM/100ML-% IV SOLN
3.0000 g | INTRAVENOUS | Status: DC
  Filled 2020-12-30: qty 100

## 2020-12-30 MED ORDER — 0.9 % SODIUM CHLORIDE (POUR BTL) OPTIME
TOPICAL | Status: DC | PRN
  Administered 2020-12-30: 1000 mL

## 2020-12-30 MED ORDER — EPHEDRINE SULFATE 50 MG/ML IJ SOLN
INTRAMUSCULAR | Status: DC | PRN
  Administered 2020-12-30 (×2): 10 mg via INTRAVENOUS
  Administered 2020-12-30: 5 mg via INTRAVENOUS

## 2020-12-30 MED ORDER — OXYCODONE HCL 5 MG PO TABS: 5.0000 mg | ORAL_TABLET | Freq: Once | ORAL | Status: DC | PRN

## 2020-12-30 MED ORDER — GABAPENTIN 300 MG PO CAPS
300.0000 mg | ORAL_CAPSULE | Freq: Three times a day (TID) | ORAL | Status: DC
  Administered 2020-12-30 – 2021-01-11 (×35): 300 mg via ORAL
  Filled 2020-12-30 (×12): qty 1
  Filled 2020-12-30: qty 3
  Filled 2020-12-30 (×22): qty 1

## 2020-12-30 MED ORDER — ASCORBIC ACID 500 MG PO TABS
1000.0000 mg | ORAL_TABLET | Freq: Every day | ORAL | Status: DC
  Administered 2020-12-30 – 2021-01-11 (×13): 1000 mg via ORAL
  Filled 2020-12-30 (×13): qty 2

## 2020-12-30 MED ORDER — ZINC SULFATE 220 (50 ZN) MG PO CAPS
220.0000 mg | ORAL_CAPSULE | Freq: Every day | ORAL | Status: DC
  Administered 2020-12-30 – 2021-01-11 (×13): 220 mg via ORAL
  Filled 2020-12-30 (×13): qty 1

## 2020-12-30 MED ORDER — METFORMIN HCL 500 MG PO TABS
500.0000 mg | ORAL_TABLET | Freq: Two times a day (BID) | ORAL | Status: DC
  Administered 2020-12-30 – 2021-01-11 (×24): 500 mg via ORAL
  Filled 2020-12-30 (×24): qty 1

## 2020-12-30 MED ORDER — OXYCODONE HCL 5 MG PO TABS: 5.0000 mg | ORAL_TABLET | ORAL | Status: DC | PRN

## 2020-12-30 MED ORDER — FUROSEMIDE 20 MG PO TABS
20.0000 mg | ORAL_TABLET | Freq: Every day | ORAL | Status: DC | PRN
  Administered 2021-01-01: 20 mg via ORAL
  Filled 2020-12-30: qty 1

## 2020-12-30 MED ORDER — DOCUSATE SODIUM 100 MG PO CAPS
100.0000 mg | ORAL_CAPSULE | Freq: Every day | ORAL | Status: DC
  Administered 2020-12-31 – 2021-01-11 (×12): 100 mg via ORAL
  Filled 2020-12-30 (×12): qty 1

## 2020-12-30 MED ORDER — OXYCODONE HCL 5 MG/5ML PO SOLN: 5.0000 mg | Freq: Once | ORAL | Status: DC | PRN

## 2020-12-30 MED ORDER — DIPHENHYDRAMINE HCL 12.5 MG/5ML PO ELIX
12.5000 mg | ORAL_SOLUTION | ORAL | Status: DC | PRN
  Administered 2021-01-01: 25 mg via ORAL
  Filled 2020-12-30: qty 10

## 2020-12-30 MED ORDER — MAGNESIUM SULFATE 2 GM/50ML IV SOLN
2.0000 g | Freq: Every day | INTRAVENOUS | Status: DC | PRN
  Filled 2020-12-30: qty 50

## 2020-12-30 MED ORDER — INSULIN DETEMIR 100 UNIT/ML ~~LOC~~ SOLN
20.0000 [IU] | Freq: Every day | SUBCUTANEOUS | Status: DC
  Administered 2020-12-30 – 2021-01-10 (×11): 20 [IU] via SUBCUTANEOUS
  Filled 2020-12-30 (×15): qty 0.2

## 2020-12-30 MED ORDER — CEFAZOLIN SODIUM 1 G IJ SOLR
INTRAMUSCULAR | Status: AC
  Filled 2020-12-30: qty 10

## 2020-12-30 MED ORDER — PHENYLEPHRINE HCL-NACL 20-0.9 MG/250ML-% IV SOLN
INTRAVENOUS | Status: DC | PRN
  Administered 2020-12-30: 33.3 ug/min via INTRAVENOUS
  Administered 2020-12-30: 30 ug/min via INTRAVENOUS

## 2020-12-30 MED ORDER — LACTATED RINGERS IV SOLN: INTRAVENOUS | Status: DC | PRN

## 2020-12-30 MED ORDER — PROPOFOL 500 MG/50ML IV EMUL
INTRAVENOUS | Status: DC | PRN
  Administered 2020-12-30: 100 ug/kg/min via INTRAVENOUS

## 2020-12-30 MED ORDER — CHLORHEXIDINE GLUCONATE 0.12 % MT SOLN: 15.0000 mL | Freq: Once | OROMUCOSAL | Status: AC

## 2020-12-30 MED ORDER — ALUM & MAG HYDROXIDE-SIMETH 200-200-20 MG/5ML PO SUSP: 15.0000 mL | ORAL | Status: DC | PRN

## 2020-12-30 MED ORDER — KETAMINE HCL 50 MG/5ML IJ SOSY
PREFILLED_SYRINGE | INTRAMUSCULAR | Status: AC
  Filled 2020-12-30: qty 5

## 2020-12-30 MED ORDER — PANTOPRAZOLE SODIUM 40 MG PO TBEC
40.0000 mg | DELAYED_RELEASE_TABLET | Freq: Every day | ORAL | Status: DC
  Administered 2020-12-31 – 2021-01-11 (×12): 40 mg via ORAL
  Filled 2020-12-30 (×12): qty 1

## 2020-12-30 MED ORDER — PHENOL 1.4 % MT LIQD: 1.0000 | OROMUCOSAL | Status: DC | PRN

## 2020-12-30 MED ORDER — FENTANYL CITRATE (PF) 100 MCG/2ML IJ SOLN
INTRAMUSCULAR | Status: AC
  Administered 2020-12-30: 100 ug via INTRAVENOUS
  Filled 2020-12-30: qty 2

## 2020-12-30 MED ORDER — MAGNESIUM CITRATE PO SOLN: 1.0000 | Freq: Once | ORAL | Status: DC | PRN

## 2020-12-30 MED ORDER — MIDAZOLAM HCL 2 MG/2ML IJ SOLN
INTRAMUSCULAR | Status: AC
  Administered 2020-12-30: 2 mg via INTRAVENOUS
  Filled 2020-12-30: qty 2

## 2020-12-30 MED ORDER — INSULIN ASPART 100 UNIT/ML IJ SOLN
6.0000 [IU] | Freq: Three times a day (TID) | INTRAMUSCULAR | Status: DC
  Administered 2020-12-31 – 2021-01-11 (×11): 6 [IU] via SUBCUTANEOUS

## 2020-12-30 MED ORDER — BISACODYL 5 MG PO TBEC: 5.0000 mg | DELAYED_RELEASE_TABLET | Freq: Every day | ORAL | Status: DC | PRN

## 2020-12-30 MED ORDER — HYDROMORPHONE HCL 1 MG/ML IJ SOLN
0.2500 mg | INTRAMUSCULAR | Status: DC | PRN
  Administered 2020-12-30: 0.5 mg via INTRAVENOUS

## 2020-12-30 MED ORDER — ONDANSETRON HCL 4 MG/2ML IJ SOLN
INTRAMUSCULAR | Status: AC
  Filled 2020-12-30: qty 6

## 2020-12-30 MED ORDER — ONDANSETRON HCL 4 MG/2ML IJ SOLN
INTRAMUSCULAR | Status: DC | PRN
  Administered 2020-12-30: 4 mg via INTRAVENOUS

## 2020-12-30 MED ORDER — DEXTROSE 5 % IV SOLN
INTRAVENOUS | Status: DC | PRN
  Administered 2020-12-30: 3 g via INTRAVENOUS

## 2020-12-30 MED ORDER — ALLOPURINOL 100 MG PO TABS: 100.0000 mg | ORAL_TABLET | Freq: Two times a day (BID) | ORAL | Status: DC

## 2020-12-30 MED ORDER — ONDANSETRON HCL 4 MG/2ML IJ SOLN: 4.0000 mg | Freq: Once | INTRAMUSCULAR | Status: DC | PRN

## 2020-12-30 MED ORDER — LABETALOL HCL 5 MG/ML IV SOLN
10.0000 mg | INTRAVENOUS | Status: DC | PRN
  Administered 2021-01-01 – 2021-01-09 (×2): 10 mg via INTRAVENOUS
  Filled 2020-12-30 (×2): qty 4

## 2020-12-30 MED ORDER — METOPROLOL TARTRATE 12.5 MG HALF TABLET
25.0000 mg | ORAL_TABLET | Freq: Once | ORAL | Status: AC
  Administered 2020-12-30: 25 mg via ORAL

## 2020-12-30 MED ORDER — HYDROMORPHONE HCL 1 MG/ML IJ SOLN
0.5000 mg | INTRAMUSCULAR | Status: DC | PRN
  Administered 2020-12-30 – 2021-01-02 (×8): 1 mg via INTRAVENOUS
  Administered 2021-01-02: 0.5 mg via INTRAVENOUS
  Administered 2021-01-03 (×4): 1 mg via INTRAVENOUS
  Administered 2021-01-04: 0.5 mg via INTRAVENOUS
  Administered 2021-01-04 – 2021-01-11 (×25): 1 mg via INTRAVENOUS
  Filled 2020-12-30 (×40): qty 1

## 2020-12-30 MED ORDER — POTASSIUM CHLORIDE CRYS ER 20 MEQ PO TBCR: 20.0000 meq | EXTENDED_RELEASE_TABLET | Freq: Every day | ORAL | Status: DC | PRN

## 2020-12-30 MED ORDER — EPHEDRINE 5 MG/ML INJ
INTRAVENOUS | Status: AC
  Filled 2020-12-30: qty 5

## 2020-12-30 MED ORDER — CHLORHEXIDINE GLUCONATE 0.12 % MT SOLN
OROMUCOSAL | Status: AC
  Administered 2020-12-30: 15 mL via OROMUCOSAL
  Filled 2020-12-30: qty 15

## 2020-12-30 MED ORDER — ACETAMINOPHEN 10 MG/ML IV SOLN: 1000.0000 mg | Freq: Once | INTRAVENOUS | Status: DC | PRN

## 2020-12-30 MED ORDER — CHLORHEXIDINE GLUCONATE 4 % EX LIQD: 60.0000 mL | Freq: Once | CUTANEOUS | Status: DC

## 2020-12-30 MED ORDER — SODIUM CHLORIDE 0.9% IV SOLUTION: Freq: Once | INTRAVENOUS | Status: AC

## 2020-12-30 SURGICAL SUPPLY — 42 items
BAG COUNTER SPONGE SURGICOUNT (BAG) ×1 IMPLANT
BAG SPNG CNTER NS LX DISP (BAG) ×1
BLADE SAW RECIP 87.9 MT (BLADE) ×2 IMPLANT
BLADE SURG 21 STRL SS (BLADE) ×2 IMPLANT
BNDG COHESIVE 6X5 TAN STRL LF (GAUZE/BANDAGES/DRESSINGS) ×1 IMPLANT
CANISTER WOUND CARE 500ML ATS (WOUND CARE) ×1 IMPLANT
COVER SURGICAL LIGHT HANDLE (MISCELLANEOUS) ×2 IMPLANT
CUFF TOURN SGL QUICK 34 (TOURNIQUET CUFF) ×2
CUFF TRNQT CYL 34X4.125X (TOURNIQUET CUFF) ×1 IMPLANT
DRAPE INCISE IOBAN 66X45 STRL (DRAPES) ×2 IMPLANT
DRAPE U-SHAPE 47X51 STRL (DRAPES) ×2 IMPLANT
DRESSING PREVENA PLUS CUSTOM (GAUZE/BANDAGES/DRESSINGS) ×1 IMPLANT
DRSG DERMACEA 8X12 NADH (GAUZE/BANDAGES/DRESSINGS) ×2 IMPLANT
DRSG PREVENA PLUS CUSTOM (GAUZE/BANDAGES/DRESSINGS)
DURAPREP 26ML APPLICATOR (WOUND CARE) ×2 IMPLANT
ELECT REM PT RETURN 9FT ADLT (ELECTROSURGICAL) ×2
ELECTRODE REM PT RTRN 9FT ADLT (ELECTROSURGICAL) ×1 IMPLANT
GLOVE SURG ORTHO LTX SZ9 (GLOVE) ×2 IMPLANT
GLOVE SURG UNDER POLY LF SZ9 (GLOVE) ×3 IMPLANT
GOWN STRL REUS W/ TWL XL LVL3 (GOWN DISPOSABLE) ×2 IMPLANT
GOWN STRL REUS W/TWL XL LVL3 (GOWN DISPOSABLE) ×4
KIT BASIN OR (CUSTOM PROCEDURE TRAY) ×2 IMPLANT
KIT TURNOVER KIT B (KITS) ×2 IMPLANT
MANIFOLD NEPTUNE II (INSTRUMENTS) ×2 IMPLANT
NS IRRIG 1000ML POUR BTL (IV SOLUTION) ×2 IMPLANT
PACK ORTHO EXTREMITY (CUSTOM PROCEDURE TRAY) ×2 IMPLANT
PAD ARMBOARD 7.5X6 YLW CONV (MISCELLANEOUS) ×2 IMPLANT
PREVENA RESTOR ARTHOFORM 46X30 (CANNISTER) ×2 IMPLANT
SPONGE T-LAP 18X18 ~~LOC~~+RFID (SPONGE) ×3 IMPLANT
STAPLER VISISTAT 35W (STAPLE) IMPLANT
STOCKINETTE IMPERVIOUS LG (DRAPES) ×2 IMPLANT
SUT ETHILON 2 0 PSLX (SUTURE) ×8 IMPLANT
SUT SILK 2 0 (SUTURE) ×2
SUT SILK 2-0 18XBRD TIE 12 (SUTURE) ×1 IMPLANT
SUT VIC AB 1 CTX 27 (SUTURE) ×4 IMPLANT
SUT VIC AB 1 CTX 36 (SUTURE) ×6
SUT VIC AB 1 CTX36XBRD ANBCTR (SUTURE) IMPLANT
TOWEL GREEN STERILE (TOWEL DISPOSABLE) ×2 IMPLANT
TUBE CONNECTING 12X1/4 (SUCTIONS) ×2 IMPLANT
YANKAUER SUCT BULB TIP NO VENT (SUCTIONS) ×2 IMPLANT
ampushield kit large ×1 IMPLANT
prevena restor incision management system ×1 IMPLANT

## 2020-12-30 NOTE — Anesthesia Procedure Notes (Signed)
Procedure Name: MAC Date/Time: 12/30/2020 12:08 PM Performed by: Annamary Carolin, CRNA Pre-anesthesia Checklist: Patient identified, Emergency Drugs available, Suction available, Patient being monitored and Timeout performed Preoxygenation: Pre-oxygenation with 100% oxygen Induction Type: IV induction Dental Injury: Teeth and Oropharynx as per pre-operative assessment

## 2020-12-30 NOTE — Progress Notes (Signed)
Orthopedic Tech Progress Note Patient Details:  Patrick [name] [birth year] [id number]  Patient ID: Jolinda Croak, male   DOB: 09-05-1965, 55 y.o.   MRN: [id number] Spoke with Dr.Duda, second outside vendor brace not needed.  Vernona Rieger 12/30/2020, 10:41 PM

## 2020-12-30 NOTE — Plan of Care (Signed)

## 2020-12-30 NOTE — Anesthesia Procedure Notes (Signed)
Anesthesia Regional Block: Adductor canal block   Pre-Anesthetic Checklist: , timeout performed,  Correct Patient, Correct Site, Correct Laterality,  Correct Procedure, Correct Position, site marked,  Risks and benefits discussed,  Surgical consent,  Pre-op evaluation,  At surgeon's request and post-op pain management  Laterality: Lower and Left  Prep: chloraprep       Needles:  Injection technique: Single-shot  Needle Type: Echogenic Needle     Needle Length: 9cm  Needle Gauge: 22     Additional Needles:   Procedures:,,,, ultrasound used (permanent image in chart),,    Narrative:  Start time: 12/30/2020 10:48 AM End time: 12/30/2020 10:54 AM Injection made incrementally with aspirations every 5 mL.  Performed by: Personally  Anesthesiologist: Barnet Glasgow, MD  Additional Notes: Block assessed prior to surgery. Pt tolerated procedure well.

## 2020-12-30 NOTE — Anesthesia Procedure Notes (Signed)
Anesthesia Regional Block: Popliteal block   Pre-Anesthetic Checklist: , timeout performed,  Correct Patient, Correct Site, Correct Laterality,  Correct Procedure, Correct Position, site marked,  Risks and benefits discussed,  Pre-op evaluation,  At surgeon's request and post-op pain management  Laterality: Lower and Left  Prep: Maximum Sterile Barrier Precautions used, chloraprep       Needles:  Injection technique: Single-shot  Needle Type: Echogenic Needle     Needle Length: 9cm  Needle Gauge: 21     Additional Needles:   Procedures:,,,, ultrasound used (permanent image in chart),,    Narrative:  Start time: 12/30/2020 10:39 AM End time: 12/30/2020 10:47 AM Injection made incrementally with aspirations every 5 mL.  Performed by: Personally  Anesthesiologist: Barnet Glasgow, MD  Additional Notes: Block assessed. Patient tolerated procedure well.

## 2020-12-30 NOTE — Op Note (Signed)
   Date of Surgery: 12/30/2020  INDICATIONS: Patrick [name] is a 55 y.o.-year-old male who presents with chronic osteomyelitis of the left calcaneus with necrotic soft tissue involving the entire plantar aspect of the left foot.  He has ascending cellulitis and swelling.Marland Kitchen  PREOPERATIVE DIAGNOSIS: Abscess osteomyelitis ulceration left foot  POSTOPERATIVE DIAGNOSIS: Same.  PROCEDURE: Transtibial amputation Application of Prevena wound VAC  SURGEON: Sharol Given, M.D.  ANESTHESIA:  general  IV FLUIDS AND URINE: See anesthesia records.  ESTIMATED BLOOD LOSS: See anesthesia records.  COMPLICATIONS: None.  DESCRIPTION OF PROCEDURE: The patient was brought to the operating room after undergoing regional anesthetic. After adequate levels of anesthesia were obtained patient's lower extremity was prepped using DuraPrep draped into a sterile field. A timeout was called. The foot was draped out of the sterile field with impervious stockinette. A transverse incision was made 11 cm distal to the tibial tubercle. This curved proximally and a large posterior flap was created. The tibia was transected 1 cm proximal to the skin incision. The fibula was transected just proximal to the tibial incision. The tibia was beveled anteriorly. A large posterior flap was created. The sciatic nerve was pulled cut and allowed to retract. The vascular bundles were suture ligated with 2-0 silk. The deep and superficial fascial layers were closed using #1 Vicryl. The skin was closed using staples and 2-0 nylon. The wound was covered with a Prevena customizable and arthroform wound VAC.  The dressing was sealed with dermatac there was a good suction fit. A prosthetic shrinker and limb protector were applied. Patient was taken to the PACU in stable condition.   DISCHARGE PLANNING:  Antibiotic duration: 24 hours  Weightbearing: Nonweightbearing on the operative extremity  Pain medication: Opioid pathway  Dressing care/ Wound VAC:  Continue wound VAC for 1 week after discharge  Discharge to: Discharge planning based on therapy's recommendations for possible inpatient rehabilitation, outpatient rehabilitation, or discharge to home with therapy  Follow-up: In the office 1 week post operative.  Meridee Score, MD [place] 1:28 PM

## 2020-12-30 NOTE — Anesthesia Postprocedure Evaluation (Signed)
Anesthesia Post Note  Patient: Patrick [name]  Procedure(s) Performed: LEFT BELOW KNEE AMPUTATION (Left: Knee)     Patient location during evaluation: PACU Anesthesia Type: Regional Level of consciousness: awake and alert Pain management: pain level controlled Vital Signs Assessment: post-procedure vital signs reviewed and stable Respiratory status: spontaneous breathing, nonlabored ventilation, respiratory function stable and patient connected to nasal cannula oxygen Cardiovascular status: stable and blood pressure returned to baseline Postop Assessment: no apparent nausea or vomiting Anesthetic complications: no   No notable events documented.  Last Vitals:  Vitals:   12/30/20 1420 12/30/20 1430  BP: 107/68 105/67  Pulse: 95   Resp: 17 13  Temp:    SpO2: 97%     Last Pain:  Vitals:   12/30/20 1430  TempSrc:   PainSc: [place]

## 2020-12-30 NOTE — Progress Notes (Signed)
Notified Dr. Valma Cava of Hemoglobin 6.4. No new orders given at this time. Report given to CRNA.

## 2020-12-30 NOTE — Transfer of Care (Signed)
Immediate Anesthesia Transfer of Care Note  Patient: Patrick [name]  Procedure(s) Performed: LEFT BELOW KNEE AMPUTATION (Left: Knee)  Patient Location: PACU  Anesthesia Type:MAC and Regional  Level of Consciousness: awake, alert , oriented and patient cooperative  Airway & Oxygen Therapy: Patient Spontanous Breathing and Patient connected to face mask oxygen  Post-op Assessment: Report given to RN, Post -op Vital signs reviewed and stable and Patient moving all extremities  Post vital signs: Reviewed and stable  Last Vitals:  Vitals Value Taken Time  BP 102/63 12/30/20 1310  Temp    Pulse 93 12/30/20 1312  Resp 17 12/30/20 1312  SpO2 100 % 12/30/20 1312  Vitals shown include unvalidated device data.  Last Pain:  Vitals:   12/30/20 0958  TempSrc:   PainSc: 0-No pain         Complications: No notable events documented.

## 2020-12-30 NOTE — Progress Notes (Signed)
Inpatient Diabetes Program Recommendations  AACE/ADA: New Consensus Statement on Inpatient Glycemic Control (2015)  Target Ranges:  Prepandial:   less than 140 mg/dL      Peak postprandial:   less than 180 mg/dL (1-2 hours)      Critically ill patients:  140 - 180 mg/dL  Results for LEWELLYN, FULTZ (MRN [id number]) as of 12/30/2020 21:15  Ref. Range 12/30/2020 09:36 12/30/2020 13:13 12/30/2020 17:51 12/30/2020 21:02  Glucose-Capillary Latest Ref Range: 70 - 99 mg/dL 179 (H) 187 (H) 225 (H) 231 (H)  Results for KOSTAS, MARROW (MRN [id number]) as of 12/30/2020 21:15  Ref. Range 08/27/2020 10:24  Hemoglobin A1C Latest Ref Range: 4.8 - 5.6 % 7.8 (H)    Admit with:  Abscess osteomyelitis left heel with maggots 4 months status post right transtibial amputation with ulcer and osteomyelitis of the left heel  History: DM2  Home DM Meds: Lantus 25 units BID       Humalog 5 units TID with meals       Metformin 500 mg BID  Current Orders: Novolog Resistant Correction Scale/ SSI (0-20 units) TID AC      Novolog 6 units TID with meals      Levemir 20 units QHS   Transtibial amputation Application of Prevena wound VAC   Note Levemir to start tonight  Novolog SSI and Novolog Meal Coverage to start tomorrow AM  Agree with current orders   --Will follow patient during hospitalization--  Wyn Quaker RN, MSN, CDE Diabetes Coordinator Inpatient Glycemic Control Team Team Pager: [phone] (8a-5p)

## 2020-12-30 NOTE — Progress Notes (Signed)
Orthopedic Tech Progress Note Patient Details:  Patrick [name] 05-02-1965 [id number] Called order into [facility]anger Patient ID: Jolinda Croak, male   DOB: [birth year], 55 y.o.   MRN: [id number]  Chip Boer 12/30/2020, 2:16 PM

## 2020-12-30 NOTE — H&P (Signed)
Patrick [name] is an 55 y.o. male.   Chief Complaint: Abscess osteomyelitis left heel with maggots. 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  Patient denies any fever or chills.  He states he does have odor swelling and drainage from the left heel.  Patient reports a history of maggots.  Past Medical History:  Diagnosis Date   Anemia    Diabetes mellitus    Gout    History of COVID-19 04/2019   Hypertension    PVD (peripheral vascular disease) ([place])    s/p R BKA    Past Surgical History:  Procedure Laterality Date   AMPUTATION Right 06/06/2020   Procedure: AMPUTATION 5TH RAY;  Surgeon: [name], MD;  Location: [place];  Service: Orthopedics;  Laterality: Right;   AMPUTATION Right 06/08/2020   Procedure: RIGHT 4TH RAY AMPUTATION;  Surgeon: [name], MD;  Location: [facility];  Service: Orthopedics;  Laterality: Right;   AMPUTATION Right 08/27/2020   Procedure: AMPUTATION BELOW KNEE;  Surgeon: Jessy Oto, MD;  Location: [facility];  Service: Orthopedics;  Laterality: Right;   CHOLECYSTECTOMY N/A 09/25/2020   Procedure: LAPAROSCOPIC CHOLECYSTECTOMY;  Surgeon: Jesusita Oka, MD;  Location: [facility];  Service: General;  Laterality: N/A;    Family History  Problem Relation Age of Onset   Diabetes Neg Hx    Social History:  reports that he has never smoked. He has never used smokeless tobacco. He reports that he does not drink alcohol and does not use drugs.  Allergies: No Known Allergies  Medications Prior to Admission  Medication Sig Dispense Refill   acetaminophen (TYLENOL) 500 MG tablet Take 1,000 mg by mouth every 6 (six) hours as needed for mild pain or headache.     furosemide (LASIX) 20 MG tablet Take 1 tablet (20 mg total) by mouth daily as needed. (Patient taking differently: Take 20 mg by mouth 2 (two) times daily.) 30 tablet 0   gabapentin (NEURONTIN) 300 MG capsule Take  1 capsule (300 mg total) by mouth 3 (three) times daily. (Patient taking differently: Take 300 mg by mouth 3 (three) times daily as needed (pain).) 90 capsule 0   indomethacin (INDOCIN) 50 MG capsule Take 50 mg by mouth 3 (three) times daily as needed for moderate pain.     insulin glargine (LANTUS SOLOSTAR) 100 UNIT/ML Solostar Pen Inject 25 Units into the skin 2 (two) times daily. 15 mL 11   insulin lispro (HUMALOG) 100 UNIT/ML KwikPen Inject 5 Units into the skin 3 (three) times daily.     metFORMIN (GLUCOPHAGE) 500 MG tablet Take 500 mg by mouth 2 (two) times daily.     metoprolol tartrate (LOPRESSOR) 25 MG tablet Take 0.5 tablets (12.5 mg total) by mouth 2 (two) times daily. 60 tablet 0   polyethylene glycol powder (GLYCOLAX/MIRALAX) 17 GM/SCOOP powder Take 17 g by mouth daily as needed for moderate constipation. 510 g 0   tiZANidine (ZANAFLEX) 4 MG tablet Take 1 tablet (4 mg total) by mouth 2 (two) times daily as needed for muscle spasms. 10 tablet 0   allopurinol (ZYLOPRIM) 100 MG tablet Take 1 tablet (100 mg total) by mouth 2 (two) times daily. (Patient not taking: Reported on 12/28/2020) 60 tablet 3   ascorbic acid (VITAMIN C) 1000 MG tablet Take 1 tablet (1,000 mg total) by mouth daily. (Patient not taking: Reported on 12/28/2020) 30 tablet 0  chlorhexidine (HIBICLENS) 4 % external liquid Apply topically daily as needed. (Patient not taking: No sig reported) 120 mL 0   docusate sodium (COLACE) 100 MG capsule Take 1 capsule (100 mg total) by mouth 2 (two) times daily. (Patient not taking: No sig reported) 15 capsule 0   doxycycline (VIBRA-TABS) 100 MG tablet Take 1 tablet (100 mg total) by mouth 2 (two) times daily. (Patient not taking: No sig reported) 60 tablet 0   doxycycline (VIBRAMYCIN) 100 MG capsule Take 1 capsule (100 mg total) by mouth 2 (two) times daily. (Patient not taking: No sig reported) 14 capsule 0   ferrous sulfate 325 (65 FE) MG tablet Take 1 tablet (325 mg total) by mouth 3  (three) times daily after meals. (Patient not taking: Reported on 12/28/2020) 90 tablet 3   insulin aspart (NOVOLOG FLEXPEN) 100 UNIT/ML FlexPen Inject 4 Units into the skin 3 (three) times daily with meals. (Patient not taking: Reported on 12/28/2020) 15 mL 11   Insulin Pen Needle 32G X 4 MM MISC Use as directed 100 each 0   Insulin Pen Needle 32G X 4 MM MISC Use as directed 100 each 0   Multiple Vitamins-Minerals (CERTAVITE/ANTIOXIDANTS) TABS Take 1 tablet by mouth daily. (Patient not taking: Reported on 12/28/2020) 30 tablet 0   traMADol-acetaminophen (ULTRACET) 37.5-325 MG tablet Take 1 tablet by mouth every 6 (six) hours as needed. (Patient not taking: Reported on 12/28/2020) 20 tablet 0   Zinc Sulfate 220 (50 Zn) MG TABS Take 1 tablet (220 mg total) by mouth daily. (Patient not taking: Reported on 12/28/2020) 30 tablet 0    No results found for this or any previous visit (from the past 48 hour(s)). No results found.  Review of Systems  All other systems reviewed and are negative.  Height 6\' 4"  (1.93 m), weight 136.1 kg. Physical Exam  Patient is alert, oriented, no adenopathy, well-dressed, normal affect, normal respiratory effort. Examination the left heel has extensive necrotic tissue with exposed bone involving the plantar midfoot and hindfoot.  Patient has a stable right transtibial amputation.  MRI scan of the left heel shows extensive osteomyelitis of the hindfoot. Assessment/Plan 1. Hx of BKA, right ([place])   2. Pain of left heel   3. PVD (peripheral vascular disease) ([place])   4. Subacute osteomyelitis of left foot ([place])       Plan: Patient would like to proceed with a transtibial amputation on the left at this time.  He has undergone prosthetic fitting for the right lower extremity.  Discharge planning based on therapy recommendations.  [name], MD 12/30/2020, 9:17 AM

## 2020-12-30 NOTE — Progress Notes (Signed)
New admission to 5N24. Patient came from PACU with left BKA, wound vac in place. Patient is drowsy but oriented *4. Able to verbalize his needs to staff. Patient not answering admission questions at this time because of his drowsiness. VS checked, surgery extremity elevated on a pillow. Bed kept in low position and locked. Call bell in reach. Will continue to monitor.

## 2020-12-31 LAB — GLUCOSE, CAPILLARY
Glucose-Capillary: 188 mg/dL — ABNORMAL HIGH (ref 70–99)
Glucose-Capillary: 102 mg/dL — ABNORMAL HIGH (ref 70–99)
Glucose-Capillary: 129 mg/dL — ABNORMAL HIGH (ref 70–99)
Glucose-Capillary: 118 mg/dL — ABNORMAL HIGH (ref 70–99)

## 2020-12-31 LAB — BASIC METABOLIC PANEL
Anion gap: 7 (ref 5–15)
BUN: 18 mg/dL (ref 6–20)
CO2: 23 mmol/L (ref 22–32)
Calcium: 7.5 mg/dL — ABNORMAL LOW (ref 8.9–10.3)
Chloride: 100 mmol/L (ref 98–111)
Creatinine, Ser: 1.57 mg/dL — ABNORMAL HIGH (ref 0.61–1.24)
GFR, Estimated: 52 mL/min — ABNORMAL LOW (ref >60)
Glucose, Bld: 214 mg/dL — ABNORMAL HIGH (ref 70–99)
Potassium: 4.1 mmol/L (ref 3.5–5.1)
Sodium: 130 mmol/L — ABNORMAL LOW (ref 135–145)

## 2020-12-31 LAB — CBC
HCT: 16.9 % — ABNORMAL LOW (ref 39.0–52.0)
Hemoglobin: 5.3 g/dL — CL (ref 13.0–17.0)
MCH: 25.1 pg — ABNORMAL LOW (ref 26.0–34.0)
MCHC: 31.4 g/dL (ref 30.0–36.0)
MCV: 80.1 fL (ref 80.0–100.0)
Platelets: 413 10*3/uL — ABNORMAL HIGH (ref 150–400)
RBC: 2.11 MIL/uL — ABNORMAL LOW (ref 4.22–5.81)
RDW: 17.8 % — ABNORMAL HIGH (ref 11.5–15.5)
WBC: 23.1 10*3/uL — ABNORMAL HIGH (ref 4.0–10.5)
nRBC: 0 % (ref 0.0–0.2)

## 2020-12-31 LAB — HEMOGLOBIN AND HEMATOCRIT, BLOOD
HCT: 18.7 % — ABNORMAL LOW (ref 39.0–52.0)
Hemoglobin: 6.2 g/dL — CL (ref 13.0–17.0)

## 2020-12-31 LAB — PREPARE RBC (CROSSMATCH)

## 2020-12-31 MED ORDER — SODIUM CHLORIDE 0.9% IV SOLUTION: Freq: Once | INTRAVENOUS | Status: AC

## 2020-12-31 NOTE — Progress Notes (Signed)
No follow up from provider at this time with new orders, on call answering service called again to page provider

## 2020-12-31 NOTE — Progress Notes (Signed)
Inpatient Rehab Admissions Coordinator:   CIR consult received. I await therapy evaluations and recommendations before I can make an assessment of candidacy for CIR.   Clemens Catholic, [place], [place] Admissions Coordinator  [phone] ([place]) [phone] (office)

## 2020-12-31 NOTE — Progress Notes (Signed)
Pt and vitals assessed after start of transfusion, no reactions noted, pt aware to notify nurse if things were to change.

## 2020-12-31 NOTE — Evaluation (Signed)
Physical Therapy Evaluation Patient Details Name: Patrick [name] MRN: [id number] DOB: 1965/05/16 Today's Date: 12/31/2020  History of Present Illness  Pt is a 55 y/o male s/p L BKA on 11/11. PMH includes R BKA, HTN, DM, and gout  Clinical Impression  Pt is s/p surgery above with deficits below. Pt requiring min guard to min A to perform bed mobility and laterally scoot along EOB. Pt reporting increased fatigue and pain which limited mobility tolerance. Pt reports he would prefer to go to SNF at d/c for rehab at d/c to give more time to progress and be independent; feel this is appropriate. Will continue to follow acutely.      Recommendations for follow up therapy are one component of a multi-disciplinary discharge planning process, led by the attending physician.  Recommendations may be updated based on patient status, additional functional criteria and insurance authorization.  Follow Up Recommendations Skilled nursing-short term rehab (<3 hours/day)    Assistance Recommended at Discharge Frequent or constant Supervision/Assistance  Functional Status Assessment Patient has had a recent decline in their functional status and demonstrates the ability to make significant improvements in function in a reasonable and predictable amount of time.  Equipment Recommendations  Other (comment) (TBD)    Recommendations for Other Services       Precautions / Restrictions Precautions Precautions: Fall Precaution Comments: bilateral BKA Restrictions Weight Bearing Restrictions: Yes LLE Weight Bearing: Non weight bearing      Mobility  Bed Mobility Overal bed mobility: Needs Assistance Bed Mobility: Supine to Sit;Sit to Supine     Supine to sit: Min guard;HOB elevated Sit to supine: Min guard   General bed mobility comments: Min guard for safety. Heavy use of bed rails to come to sitting.    Transfers Overall transfer level: Needs assistance   Transfers: Bed to chair/wheelchair/BSC             Lateral/Scoot Transfers: Min assist General transfer comment: Min A for steadying assist to laterally scoot along EOB for repositioning.    Ambulation/Gait                  Stairs            Wheelchair Mobility    Modified Rankin (Stroke Patients Only)       Balance Overall balance assessment: Needs assistance Sitting-balance support: No upper extremity supported Sitting balance-Leahy Scale: Fair                                       Pertinent Vitals/Pain Pain Assessment: Faces Faces Pain Scale: Hurts even more Pain Location: L residual limb Pain Descriptors / Indicators: Grimacing;Guarding Pain Intervention(s): Monitored during session;Limited activity within patient's tolerance;Repositioned    Home Living Family/patient expects to be discharged to:: Skilled nursing facility Living Arrangements: Alone                      Prior Function Prior Level of Function : Independent/Modified Independent             Mobility Comments: Normally independent with transfers to [facility] and scooter, but has had multiple falls.       Hand Dominance        Extremity/Trunk Assessment   Upper Extremity Assessment Upper Extremity Assessment: Defer to OT evaluation    Lower Extremity Assessment Lower Extremity Assessment: RLE deficits/detail;LLE deficits/detail RLE Deficits / Details: R BKA  at baseline LLE Deficits / Details: s/p L BKA    Cervical / Trunk Assessment Cervical / Trunk Assessment: Normal  Communication   Communication: No difficulties  Cognition Arousal/Alertness: Awake/alert Behavior During Therapy: WFL for tasks assessed/performed Overall Cognitive Status: Within Functional Limits for tasks assessed                                          General Comments General comments (skin integrity, edema, etc.): Pt reports his prosthetic for RLE is at [facility]    Exercises      Assessment/Plan    PT Assessment Patient needs continued PT services  PT Problem List Decreased strength;Decreased activity tolerance;Decreased balance;Decreased mobility;Pain       PT Treatment Interventions DME instruction;Functional mobility training;Therapeutic activities;Therapeutic exercise;Balance training;Wheelchair mobility training;Patient/family education    PT Goals (Current goals can be found in the Care Plan section)  Acute Rehab PT Goals Patient Stated Goal: to be independent PT Goal Formulation: With patient Time For Goal Achievement: 01/14/21 Potential to Achieve Goals: Good    Frequency Min 2X/week   Barriers to discharge Decreased caregiver support      Co-evaluation               AM-PAC PT "6 Clicks" Mobility  Outcome Measure Help needed turning from your back to your side while in a flat bed without using bedrails?: A Little Help needed moving from lying on your back to sitting on the side of a flat bed without using bedrails?: A Little Help needed moving to and from a bed to a chair (including a wheelchair)?: A Lot Help needed standing up from a chair using your arms (e.g., wheelchair or bedside chair)?: Total Help needed to walk in hospital room?: Total Help needed climbing 3-5 steps with a railing? : Total 6 Click Score: 11    End of Session   Activity Tolerance: Patient limited by pain;Patient limited by fatigue Patient left: in bed;with call bell/phone within reach;with nursing/sitter in room Nurse Communication: Mobility status PT Visit Diagnosis: Other abnormalities of gait and mobility (R26.89);Difficulty in walking, not elsewhere classified (R26.2)    Time: 3013-1438 PT Time Calculation (min) (ACUTE ONLY): 24 min   Charges:   PT Evaluation $PT Eval Moderate Complexity: 1 Mod PT Treatments $Therapeutic Activity: 8-22 mins        Lou Miner, DPT  Acute Rehabilitation Services  Pager: [phone] Office: [phone]   Rudean Hitt 12/31/2020, 1:53 PM

## 2020-12-31 NOTE — Progress Notes (Signed)
Pre blood vitals done, pt educated on hemoglobin level, pt informed of needing blood and advised to notify nurse of reactions

## 2020-12-31 NOTE — Progress Notes (Signed)
Provider on call paged in regards to critical hgb of 5.3 as well as second page to notify provider of pt not being able to void, in/out performed

## 2020-12-31 NOTE — Progress Notes (Signed)
Blood transfusion dc at 2106.. pt assessed, VS taken, as well as 30 min prior, no known reactions, pt denies any adverse symptoms at this time

## 2020-12-31 NOTE — Progress Notes (Signed)
Pt unable to void, paged on call provider for standing orders for future in case no output and bladder scan volume elevated

## 2020-12-31 NOTE — Progress Notes (Signed)
Patient ID: Patrick [name], male   DOB: Oct 08, 1965, 55 y.o.   MRN: [id number] Patient is postoperative day 1 left transtibial amputation.  Patient's hemoglobin is 5.3 this morning.  He received 2 units of packed red blood cells last night and 2 additional units have been ordered this morning.  There is 50 cc of clear serosanguineous fluid in the wound VAC canister there are 2 Xs without adequate suction fit to transition to the portable pump.  Patient has borderline renal function, he has been on chronic nonsteroidals.  Will not order Toradol.

## 2020-12-31 NOTE — Progress Notes (Signed)
Blood started, nurse at bedside, pt sleeping

## 2020-12-31 NOTE — Progress Notes (Signed)
Pt hemoglobin after receiving two units of blood transfusion is 6.2.Patrick KitchenMarland Kitchenon call provider made aware...verbal orders to transfuse 1 unit of blood. Nurse will place orders

## 2020-12-31 NOTE — Progress Notes (Signed)
Provider called back with verbal orders to transfuse two units of RBC. Orders placed... Provider also gave the okay for one more in/out cath if needed and next step is a foley

## 2020-12-31 NOTE — Evaluation (Signed)
Occupational Therapy Evaluation Patient Details Name: Patrick [name] MRN: [id number] DOB: Feb 11, 1966 Today's Date: 12/31/2020   History of Present Illness Pt is a 55 y/o male s/p L BKA on 11/11. PMH includes R BKA, HTN, DM, and gout   Clinical Impression   Prior to admission, pt was living alone in a hotel (extended stay), with wheelchair accessibility. Pt was mod I with self-care, transfers, and functional mobility using a wheelchair/scooter. Pt had groceries delivered, carried out food, and depended on hotel staff for cleaning. Pt takes uber to get to doctor's appointments.   Today, pt received semi-reclined in bed, pt agreeable to OT eval. Pt with blood transfusion running. Pt c/o 9-10/10 pain in LLE residual limb, nurse aware. Pt recently transitioned to EOB with physical therapy and refused to transition to EOB again with OT 2/2 pain and fatigue. Pt agreeable to more bed mobility tomorrow. OT will further assess pt's ADL function EOB next session. At bed level, pt requires max-total assist for LB self-care and mod assist-mod I for UB self-care. Anticipate at least min assist for transitioning to EOB. Educated pt on joint contracture prevention, adaptive ADL strategies, DME/AE, post-acute rehab options (SNF vs inpatient rehab), optimal positioning for BLE residual limbs, and encouragement to participate in acute therapy to gain strength/ROM/endurance. Pt reports having PRN S/A from family upon d/c. OT will continue to follow pt acutely. Pt prefers SNF upon d/c.       Recommendations for follow up therapy are one component of a multi-disciplinary discharge planning process, led by the attending physician.  Recommendations may be updated based on patient status, additional functional criteria and insurance authorization.   Follow Up Recommendations  Skilled nursing-short term rehab (<3 hours/day)    Assistance Recommended at Discharge Intermittent Supervision/Assistance  Functional Status  Assessment  Patient has had a recent decline in their functional status and demonstrates the ability to make significant improvements in function in a reasonable and predictable amount of time.  Equipment Recommendations  Other (comment) (defer to next venue of care)    Recommendations for Other Services Other (comment) (None)     Precautions / Restrictions Precautions Precautions: Fall Precaution Comments: bilateral BKA Restrictions Weight Bearing Restrictions: Yes LLE Weight Bearing: Non weight bearing      Mobility Bed Mobility Overal bed mobility: Needs Assistance Bed Mobility: Supine to Sit;Sit to Supine     Supine to sit: Min guard;HOB elevated Sit to supine: Min guard   General bed mobility comments: pt refusing to mobilize to EOB 2/2 9-10/10 pain in LLE residual limb, blood transfusion also running    Transfers Overall transfer level: Needs assistance (refused functional mobility/transfers 2/2 increased pain to LLE residual limb)   Transfers: Bed to chair/wheelchair/BSC            Lateral/Scoot Transfers: Min assist General transfer comment: Min A for steadying assist to laterally scoot along EOB for repositioning.      Balance Overall balance assessment:  (not able to assess, pt refused to transition to EOB) Sitting-balance support: No upper extremity supported Sitting balance-Leahy Scale: Fair         ADL either performed or assessed with clinical judgement   ADL Overall ADL's : Needs assistance/impaired Eating/Feeding: Independent;Bed level Eating/Feeding Details (indicate cue type and reason): bed level Grooming: Wash/dry hands;Wash/dry face;Set up;Modified independent;Bed level   Upper Body Bathing: Minimal assistance;Bed level   Lower Body Bathing: Moderate assistance;Maximal assistance;Sitting/lateral leans   Upper Body Dressing : Modified independent;Set up;Bed level  Lower Body Dressing: Moderate assistance;Maximal  assistance;Sitting/lateral leans   Toilet Transfer:  (not assessed)   Toileting- Clothing Manipulation and Hygiene: Sitting/lateral lean;Maximal assistance;Moderate assistance         General ADL Comments: pt refused to sit EOB today, need to assess EOB/OOB ADLs in follow up treatments     Vision Baseline Vision/History: 1 Wears glasses Ability to See in Adequate Light: 0 Adequate Patient Visual Report: No change from baseline Vision Assessment?: No apparent visual deficits            Pertinent Vitals/Pain Pain Assessment: 0-10 Pain Score: 9  Faces Pain Scale: Hurts even more Pain Location: L residual limb Pain Descriptors / Indicators: Grimacing;Guarding Pain Intervention(s): Limited activity within patient's tolerance;Monitored during session     Hand Dominance Right   Extremity/Trunk Assessment Upper Extremity Assessment Upper Extremity Assessment: Overall WFL for tasks assessed   Lower Extremity Assessment Lower Extremity Assessment: Defer to PT evaluation RLE Deficits / Details: R BKA at baseline LLE Deficits / Details: s/p L BKA   Cervical / Trunk Assessment Cervical / Trunk Assessment: Normal   Communication Communication Communication: No difficulties   Cognition Arousal/Alertness: Awake/alert Behavior During Therapy: WFL for tasks assessed/performed Overall Cognitive Status: Within Functional Limits for tasks assessed       General Comments  wound vac to LLE, blood transfusion running            Home Living Family/patient expects to be discharged to:: Skilled nursing facility Living Arrangements: Alone         Additional Comments: pt lives in an extended stay hotel with accessibility in room; has tub bench; sister and brother PRN S/A      Prior Functioning/Environment Prior Level of Function : Independent/Modified Independent             Mobility Comments: Normally independent with transfers to [facility] and scooter, but has had multiple  falls. ADLs Comments: mod I for ADLs/ADL mobility with scooter/wheelchair, uber or [employer] delivery        OT Problem List: Decreased strength;Decreased activity tolerance;Impaired balance (sitting and/or standing);Pain      OT Treatment/Interventions: Self-care/ADL training;Neuromuscular education;DME and/or AE instruction;Therapeutic activities;Patient/family education    OT Goals(Current goals can be found in the care plan section) Acute Rehab OT Goals Patient Stated Goal: SNF then home OT Goal Formulation: With patient Time For Goal Achievement: 01/14/21 Potential to Achieve Goals: Good  OT Frequency: Min 2X/week    AM-PAC OT "6 Clicks" Daily Activity     Outcome Measure Help from another person eating meals?: None Help from another person taking care of personal grooming?: A Little Help from another person toileting, which includes using toliet, bedpan, or urinal?: A Lot Help from another person bathing (including washing, rinsing, drying)?: A Lot Help from another person to put on and taking off regular upper body clothing?: A Little Help from another person to put on and taking off regular lower body clothing?: A Lot 6 Click Score: 16   End of Session Nurse Communication: Mobility status  Activity Tolerance: Patient limited by pain Patient left: in bed;with call bell/phone within reach  OT Visit Diagnosis: Unsteadiness on feet (R26.81);History of falling (Z91.81);Muscle weakness (generalized) (M62.81);Pain Pain - Right/Left: Left Pain - part of body: Leg                Time: 2706-2376 OT Time Calculation (min): 30 min Charges:  OT General Charges $OT Visit: 1 Visit OT Evaluation $OT Eval Low Complexity: 1 Low  OT Treatments $Therapeutic Activity: 8-22 mins  Michel Bickers, OTR/L [facility] [place] 12/31/2020, 4:52 PM

## 2021-01-01 LAB — GLUCOSE, CAPILLARY
Glucose-Capillary: 73 mg/dL (ref 70–99)
Glucose-Capillary: 133 mg/dL — ABNORMAL HIGH (ref 70–99)
Glucose-Capillary: 49 mg/dL — ABNORMAL LOW (ref 70–99)
Glucose-Capillary: 58 mg/dL — ABNORMAL LOW (ref 70–99)
Glucose-Capillary: 126 mg/dL — ABNORMAL HIGH (ref 70–99)
Glucose-Capillary: 224 mg/dL — ABNORMAL HIGH (ref 70–99)

## 2021-01-01 LAB — BASIC METABOLIC PANEL
Anion gap: 7 (ref 5–15)
BUN: 17 mg/dL (ref 6–20)
CO2: 25 mmol/L (ref 22–32)
Calcium: 7.8 mg/dL — ABNORMAL LOW (ref 8.9–10.3)
Chloride: 104 mmol/L (ref 98–111)
Creatinine, Ser: 1.66 mg/dL — ABNORMAL HIGH (ref 0.61–1.24)
GFR, Estimated: 49 mL/min — ABNORMAL LOW (ref >60)
Glucose, Bld: 101 mg/dL — ABNORMAL HIGH (ref 70–99)
Potassium: 3.9 mmol/L (ref 3.5–5.1)
Sodium: 136 mmol/L (ref 135–145)

## 2021-01-01 LAB — CBC
HCT: 20.7 % — ABNORMAL LOW (ref 39.0–52.0)
HCT: 24.3 % — ABNORMAL LOW (ref 39.0–52.0)
Hemoglobin: 6.8 g/dL — CL (ref 13.0–17.0)
Hemoglobin: 8 g/dL — ABNORMAL LOW (ref 13.0–17.0)
MCH: 26.8 pg (ref 26.0–34.0)
MCH: 27.1 pg (ref 26.0–34.0)
MCHC: 32.9 g/dL (ref 30.0–36.0)
MCHC: 32.9 g/dL (ref 30.0–36.0)
MCV: 81.5 fL (ref 80.0–100.0)
MCV: 82.4 fL (ref 80.0–100.0)
Platelets: 422 10*3/uL — ABNORMAL HIGH (ref 150–400)
Platelets: 463 10*3/uL — ABNORMAL HIGH (ref 150–400)
RBC: 2.54 MIL/uL — ABNORMAL LOW (ref 4.22–5.81)
RBC: 2.95 MIL/uL — ABNORMAL LOW (ref 4.22–5.81)
RDW: 18 % — ABNORMAL HIGH (ref 11.5–15.5)
RDW: 17.9 % — ABNORMAL HIGH (ref 11.5–15.5)
WBC: 18.5 10*3/uL — ABNORMAL HIGH (ref 4.0–10.5)
WBC: 20.9 10*3/uL — ABNORMAL HIGH (ref 4.0–10.5)
nRBC: 0 % (ref 0.0–0.2)
nRBC: 0.2 % (ref 0.0–0.2)

## 2021-01-01 MED ORDER — SODIUM CHLORIDE 0.9% IV SOLUTION: Freq: Once | INTRAVENOUS | Status: AC

## 2021-01-01 MED ORDER — FUROSEMIDE 10 MG/ML IJ SOLN
20.0000 mg | Freq: Once | INTRAMUSCULAR | Status: DC
  Filled 2021-01-01 (×3): qty 2

## 2021-01-01 NOTE — Progress Notes (Signed)
Inpatient Rehab Admissions Coordinator:    Both PT and OT are recommending SNF level rehab for this Pt. CIR will not pursue an admission.   Clemens Catholic, [place], [place] Admissions Coordinator  [phone] ([place]) [phone] (office)

## 2021-01-01 NOTE — NC FL2 (Signed)
[place] LEVEL OF CARE SCREENING TOOL     IDENTIFICATION  Patient Name: Patrick [name] Birthdate: Apr 12, 1965 Sex: male Admission Date (Current Location): 12/30/2020  [facility] and [place] Number:  Herbalist and Address:  [facility], [place] [address] [place], [place] [address]      Provider Number: [id number]  Attending Physician Name and Address:  [name], MD  Relative Name and Phone Number:  Coralyn Mark [phone]    Current Level of Care: Hospital Recommended Level of Care: [place] Prior Approval Number:    Date Approved/Denied:   PASRR Number: [id number]  Discharge Plan: SNF    Current Diagnoses: Patient Active Problem List   Diagnosis Date Noted   Left below-knee amputee ([place]) 12/30/2020   Subacute osteomyelitis, left ankle and foot ([place])    Cholelithiasis with acute cholecystitis 09/25/2020   Hypertensive urgency 09/25/2020   Constipation 09/25/2020   Thrombocytosis 09/25/2020   S/P BKA (below knee amputation) unilateral, right ([place]) 09/01/2020   ABLA (acute blood loss anemia) 09/01/2020   Anemia 08/27/2020   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 ([place]) 07/20/2020   Dehiscence of amputation stump ([place]) 07/20/2020   Cutaneous abscess of right foot    Osteomyelitis of right foot ([place])    Severe protein-calorie malnutrition ([place])    Sepsis due to cellulitis ([place]) 06/04/2020   Diabetic foot ulcer ([place]) 06/04/2020   Diabetic foot infection ([place]) 06/04/2020   CKD (chronic kidney disease) stage 3, GFR 30-59 ml/min ([place]) 06/04/2020   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[place]) 06/04/2020   Acute respiratory disease due to COVID-19 virus 05/03/2019   Renal insufficiency 05/03/2019   GOUT 01/27/2010   OBESITY 01/27/2010   Insulin-requiring or dependent type II diabetes mellitus ([place]) 02/20/2008   HYPERTENSION, BENIGN ESSENTIAL 02/20/2008    Orientation RESPIRATION BLADDER Height & Weight      Self, Time, Situation, Place  Normal Continent Weight: 295 lb (133.8 kg) Height:  6\' 4"  (193 cm)  BEHAVIORAL SYMPTOMS/MOOD NEUROLOGICAL BOWEL NUTRITION STATUS      Continent Diet (Please see discharge summary)  AMBULATORY STATUS COMMUNICATION OF NEEDS Skin   Limited Assist Verbally Other (Comment) (dry,flaky,Incision closed,leg,left,compression wrap,intact,dry,Negative pressure wound therapy,leg,left,medial,dry,clean)                       Personal Care Assistance Level of Assistance  Bathing, Feeding, Dressing Bathing Assistance: Limited assistance Feeding assistance: Independent Dressing Assistance: Independent     Functional Limitations Info  Sight, Hearing, Speech Sight Info: Adequate Hearing Info: Adequate Speech Info: Adequate    SPECIAL CARE FACTORS FREQUENCY  PT (By licensed PT), OT (By licensed OT)     PT Frequency: 5x min weekly OT Frequency: 5x min weekly            Contractures Contractures Info: Not present    Additional Factors Info  Code Status, Allergies, Insulin Sliding Scale Code Status Info: FULL Allergies Info: No Known Allergies   Insulin Sliding Scale Info: insulin aspart (novoLOG) injection 0-20 Units 3 times daily with meals,insulin aspart (novoLOG) injection 6 Units 3 times daily with meals,insulin detemir (LEVEMIR) injection 20 Units daily at bedtime       Current Medications (01/01/2021):  This is the current hospital active medication list Current Facility-Administered Medications  Medication Dose Route Frequency Provider Last Rate Last Admin   0.9 %  sodium chloride infusion (Manually program via Guardrails IV Fluids)  Intravenous Once Dwana Melena L, PA-C       0.9 %  sodium chloride infusion   Intravenous Continuous [name], MD 10 mL/hr at 01/01/21 0243 New Bag at 01/01/21 0243   acetaminophen (TYLENOL) tablet 325-650 mg  325-650 mg Oral Q6H PRN [name], MD   650 mg at 01/01/21 0428   alum & mag  hydroxide-simeth (MAALOX/MYLANTA) 200-200-20 MG/5ML suspension 15-30 mL  15-30 mL Oral Q2H PRN [name], MD       ascorbic acid (VITAMIN C) tablet 1,000 mg  1,000 mg Oral Daily [name], MD   1,000 mg at 01/01/21 0858   bisacodyl (DULCOLAX) EC tablet 5 mg  5 mg Oral Daily PRN [name], MD       diphenhydrAMINE (BENADRYL) 12.5 MG/5ML elixir 12.5-25 mg  12.5-25 mg Oral Q4H PRN [name], MD   25 mg at 01/01/21 2956   docusate sodium (COLACE) capsule 100 mg  100 mg Oral Daily [name], MD   100 mg at 01/01/21 0858   furosemide (LASIX) injection 20 mg  20 mg Intravenous Once Stanbery, Mary L, PA-C       furosemide (LASIX) tablet 20 mg  20 mg Oral Daily PRN [name], MD       gabapentin (NEURONTIN) capsule 300 mg  300 mg Oral TID [name], MD   300 mg at 01/01/21 0858   guaiFENesin-dextromethorphan (ROBITUSSIN DM) 100-10 MG/5ML syrup 15 mL  15 mL Oral Q4H PRN [name], MD       hydrALAZINE (APRESOLINE) injection 5 mg  5 mg Intravenous Q20 Min PRN [name], MD       HYDROmorphone (DILAUDID) injection 0.5-1 mg  0.5-1 mg Intravenous Q4H PRN [name], MD   1 mg at 12/31/20 2020   insulin aspart (novoLOG) injection 0-20 Units  0-20 Units Subcutaneous TID WC [name], MD   4 Units at 12/31/20 0839   insulin aspart (novoLOG) injection 6 Units  6 Units Subcutaneous TID WC [name], MD   6 Units at 12/31/20 0840   insulin detemir (LEVEMIR) injection 20 Units  20 Units Subcutaneous QHS [name], MD   20 Units at 12/31/20 2210   labetalol (NORMODYNE) injection 10 mg  10 mg Intravenous Q10 min PRN [name], MD       magnesium sulfate IVPB 2 g 50 mL  2 g Intravenous Daily PRN [name], MD       metFORMIN (GLUCOPHAGE) tablet 500 mg  500 mg Oral BID WC [name], MD   500 mg at 01/01/21 0858   metoprolol tartrate (LOPRESSOR) injection 2-5 mg  2-5 mg Intravenous Q2H PRN [name], MD       metoprolol tartrate (LOPRESSOR)  tablet 12.5 mg  12.5 mg Oral BID [name], MD   12.5 mg at 01/01/21 2130   nutrition supplement (JUVEN) (JUVEN) powder packet 1 packet  1 packet Oral BID BM [name], MD   1 packet at 01/01/21 0858   ondansetron (ZOFRAN) injection 4 mg  4 mg Intravenous Q6H PRN [name], MD       oxyCODONE (Oxy IR/ROXICODONE) immediate release tablet 10-15 mg  10-15 mg Oral Q4H PRN [name], MD   15 mg at 01/01/21 8657   oxyCODONE (Oxy IR/ROXICODONE) immediate release tablet 5-10 mg  5-10 mg Oral Q4H PRN Sharol Given,  Illene Regulus, MD       pantoprazole (PROTONIX) EC tablet 40 mg  40 mg Oral Daily [name], MD   40 mg at 01/01/21 0858   phenol (CHLORASEPTIC) mouth spray 1 spray  1 spray Mouth/Throat PRN [name], MD       polyethylene glycol (MIRALAX / GLYCOLAX) packet 17 g  17 g Oral Daily PRN [name], MD       potassium chloride SA (KLOR-CON) CR tablet 20-40 mEq  20-40 mEq Oral Daily PRN [name], MD       zinc sulfate capsule 220 mg  220 mg Oral Daily [name], MD   220 mg at 01/01/21 0858   zolpidem (AMBIEN) tablet 5 mg  5 mg Oral QHS PRN,MR X 1 [name], MD   5 mg at 01/01/21 0005   Facility-Administered Medications Ordered in Other Encounters  Medication Dose Route Frequency Provider Last Rate Last Admin   ketamine (KETALAR) bolus via infusion 68.05 mg  0.5 mg/kg Intravenous Once [name], MD         Discharge Medications: Please see discharge summary for a list of discharge medications.  Relevant Imaging Results:  Relevant Lab Results:   Additional Information SSN-[id number],Both Covid Vaccines  Milas Gain, [place]

## 2021-01-01 NOTE — Progress Notes (Signed)
Blood transfusion complete, no reactions noted, pt aware to notify nurse of chnages

## 2021-01-01 NOTE — Progress Notes (Signed)
Pt informed of blood transfusion, vs assessed, blood admin began... at bedside with pt monitoring for reactions

## 2021-01-01 NOTE — Progress Notes (Signed)
Subjective: 2 Days Post-Op Procedure(s) (LRB): LEFT BELOW KNEE AMPUTATION (Left) Patient reports pain as mild.  Patient c/o fatigue.  No chest pain/pressure/palpitations or sob.    Objective: Vital signs in last 24 hours: Temp:  [98.1 F (36.7 C)-100 F (37.8 C)] 98.6 F (37 C) (11/13 0747) Pulse Rate:  [90-110] 94 (11/13 0747) Resp:  [18-20] 18 (11/13 0747) BP: (116-148)/(68-94) 116/68 (11/13 0747) SpO2:  [97 %-100 %] 99 % (11/13 0747)  Intake/Output from previous day: 11/12 0701 - 11/13 0700 In: 945 [P.O.:580; Blood:365] Out: 750 [Urine:650; Drains:100] Intake/Output this shift: No intake/output data recorded.  Recent Labs    12/30/20 1025 12/31/20 0239 12/31/20 1927 01/01/21 0627  HGB 6.4* 5.3* 6.2* 6.8*   Recent Labs    12/31/20 0239 12/31/20 1927 01/01/21 0627  WBC 23.1*  --  18.5*  RBC 2.11*  --  2.54*  HCT 16.9* 18.7* 20.7*  PLT 413*  --  422*   Recent Labs    12/31/20 0239 01/01/21 0627  NA 130* 136  K 4.1 3.9  CL 100 104  CO2 23 25  BUN 18 17  CREATININE 1.57* 1.66*  GLUCOSE 214* 101*  CALCIUM 7.5* 7.8*   No results for input(s): LABPT, INR in the last 72 hours.  Neurologically intact Wound vac in place and functioning with 100 serosanguinous fluid in canistera     Assessment/Plan: 2 Days Post-Op Procedure(s) (LRB): LEFT BELOW KNEE AMPUTATION (Left) Up with therapy NWB LLE ABLA on chronic anemia- patients vital signs stable.  He is tired, but otherwise asymptomatic.  One unit prbc given last night.  Hemoglobin 6.8 this am.   Will transfuse with another until of blood      Aundra Dubin 01/01/2021, 10:13 AM

## 2021-01-01 NOTE — Progress Notes (Signed)
pt refused insuline didn't want to bottom during the night and BP elevation PRN given.

## 2021-01-01 NOTE — Progress Notes (Addendum)
Inpatient Diabetes Program Recommendations  AACE/ADA: New Consensus Statement on Inpatient Glycemic Control (2015)  Target Ranges:  Prepandial:   less than 140 mg/dL      Peak postprandial:   less than 180 mg/dL (1-2 hours)      Critically ill patients:  140 - 180 mg/dL  Results for Patrick [name], Patrick [name] (MRN [id number]) as of 01/01/2021 09:32  Ref. Range 01/01/2021 07:44  Glucose-Capillary Latest Ref Range: 70 - 99 mg/dL 73    Home DM Meds: Lantus 25 units BID                             Humalog 5 units TID with meals                             Metformin 500 mg BID   Current Orders: Levemir 20 units QHS Novolog 0-20 units TID  Novolog 6 units TID with meals Metformin 500 mg BID    MD- Note AM CBG today 73 mg/dl after getting 20 units Levemir last PM  Please consider reducing Levemir to 15 units QHS    --Will follow patient during hospitalization--  Wyn Quaker RN, MSN, CDE Diabetes Coordinator Inpatient Glycemic Control Team Team Pager: [phone] (8a-5p)

## 2021-01-01 NOTE — TOC Initial Note (Addendum)
Transition of Care [facility]) - Initial/Assessment Note    Patient Details  Name: Patrick [name] MRN: [id number] Date of Birth: 22-Sep-1965  [facility]) CM/SW Contact:    Milas Gain, [place] Phone Number: 01/01/2021, 12:12 PM  Clinical Narrative:                  CSW received consult for possible SNF placement at time of discharge. CSW spoke with patient at bedside regarding PT recommendation of SNF placement at time of discharge. Patient reports he comes from home alone. Patient expressed understanding of PT recommendation and is agreeable to SNF placement at time of discharge. SW discussed possible barrier to placement. Patient currently has no insurance. Patient reports that he applied for medicaid. Patient reports should be close to 90 days since he applied. Patient gave CSW permission to reach out to financial counseling to check on status of medicaid. Patient gave CSW permission to fax out initial referral near the [place] area. Patient has received both COVID vaccines. Patient is interested in CIR as back up plan to SNF.No further questions reported at this time. CSW emailed Shanon Rosser with financial counseling to check on status of patients medicaid. CSW to continue to follow and assist with discharge planning needs.   Expected Discharge Plan: Skilled Nursing Facility Barriers to Discharge: Continued Medical Work up   Patient Goals and CMS Choice Patient states their goals for this hospitalization and ongoing recovery are:: SNF CMS Medicare.gov Compare Post Acute Care list provided to:: Patient Choice offered to / list presented to : Patient  Expected Discharge Plan and Services Expected Discharge Plan: [place] In-house Referral: Clinical Social Work     Living arrangements for the past 2 months: Hotel/Motel                                      Prior Living Arrangements/Services Living arrangements for the past 2 months:  Hotel/Motel Lives with:: Self Patient language and need for interpreter reviewed:: Yes Do you feel safe going back to the place where you live?: No   SNF  Need for Family Participation in Patient Care: Yes (Comment) Care giver support system in place?: Yes (comment)   Criminal Activity/Legal Involvement Pertinent to Current Situation/Hospitalization: No - Comment as needed  Activities of Daily Living Home Assistive Devices/Equipment: Wheelchair, Environmental consultant (specify type), Cane (specify quad or straight), CBG Meter, Eyeglasses ADL Screening (condition at time of admission) Patient's cognitive ability adequate to safely complete daily activities?: Yes Is the patient deaf or have difficulty hearing?: No Does the patient have difficulty seeing, even when wearing glasses/contacts?: No Does the patient have difficulty concentrating, remembering, or making decisions?: No Patient able to express need for assistance with ADLs?: Yes Does the patient have difficulty dressing or bathing?: No Independently performs ADLs?: Yes (appropriate for developmental age) Does the patient have difficulty walking or climbing stairs?: Yes Weakness of Legs: None Weakness of Arms/Hands: None  Permission Sought/Granted Permission sought to share information with : Case Manager, Family Supports, Customer service manager Permission granted to share information with : Yes, Verbal Permission Granted  Share Information with NAME: Coralyn Mark  Permission granted to share info w AGENCY: SNF  Permission granted to share info w Relationship: sister  Permission granted to share info w Contact Information: Coralyn Mark [phone]  Emotional Assessment Appearance:: Appears stated age Attitude/Demeanor/Rapport: Gracious Affect (typically observed): Calm Orientation: : Oriented to  Self, Oriented to Place, Oriented to  Time, Oriented to Situation Alcohol / Substance Use: Not Applicable Psych Involvement: No (comment)  Admission  diagnosis:  Left below-knee amputee [facility]) [Z89.512] Patient Active Problem List   Diagnosis Date Noted   Left below-knee amputee ([place]) 12/30/2020   Subacute osteomyelitis, left ankle and foot ([place])    Cholelithiasis with acute cholecystitis 09/25/2020   Hypertensive urgency 09/25/2020   Constipation 09/25/2020   Thrombocytosis 09/25/2020   S/P BKA (below knee amputation) unilateral, right ([place]) 09/01/2020   ABLA (acute blood loss anemia) 09/01/2020   Anemia 08/27/2020   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 ([place]) 07/20/2020   Dehiscence of amputation stump ([place]) 07/20/2020   Cutaneous abscess of right foot    Osteomyelitis of right foot ([place])    Severe protein-calorie malnutrition ([place])    Sepsis due to cellulitis ([place]) 06/04/2020   Diabetic foot ulcer ([place]) 06/04/2020   Diabetic foot infection ([place]) 06/04/2020   CKD (chronic kidney disease) stage 3, GFR 30-59 ml/min ([place]) 06/04/2020   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[place]) 06/04/2020   Acute respiratory disease due to COVID-19 virus 05/03/2019   Renal insufficiency 05/03/2019   GOUT 01/27/2010   OBESITY 01/27/2010   Insulin-requiring or dependent type II diabetes mellitus ([place]) 02/20/2008   HYPERTENSION, BENIGN ESSENTIAL 02/20/2008   PCP:  Beaulah Corin, [facility] Pharmacy:   [facility] [address] [place] [address] Phone: [phone] Fax: [place] [address] [place]. [place] [address] Phone: [phone] Fax: [phone]     Social Determinants of Health (SDOH) Interventions    Readmission Risk Interventions Readmission Risk Prevention Plan 08/29/2020  Transportation Screening Complete  PCP or Specialist Appt within 3-5 Days Complete  HRI or [place] Complete  Social Work Consult for [place] Planning/Counseling Complete  Palliative Care Screening Not Applicable  Medication  Review Press photographer) Complete  Some recent data might be hidden

## 2021-01-02 ENCOUNTER — Encounter (HOSPITAL_COMMUNITY): Payer: Self-pay | Admitting: Orthopedic Surgery

## 2021-01-02 LAB — TYPE AND SCREEN
ABO/RH(D): AB POS
Antibody Screen: NEGATIVE
Unit division: 0
Unit division: 0
Unit division: 0
Unit division: 0
Unit division: 0
Unit division: 0

## 2021-01-02 LAB — BPAM RBC
Blood Product Expiration Date: 202211212359
Blood Product Expiration Date: 202211242359
Blood Product Expiration Date: 202211272359
Blood Product Expiration Date: 202212072359
Blood Product Expiration Date: 202212092359
Blood Product Expiration Date: 202212152359
ISSUE DATE / TIME: 202211111453
ISSUE DATE / TIME: 202211111804
ISSUE DATE / TIME: 202211120518
ISSUE DATE / TIME: 202211121305
ISSUE DATE / TIME: 202211122353
ISSUE DATE / TIME: 202211131453
Unit Type and Rh: 6200
Unit Type and Rh: 6200
Unit Type and Rh: 6200
Unit Type and Rh: 8400
Unit Type and Rh: 8400
Unit Type and Rh: 8400

## 2021-01-02 LAB — GLUCOSE, CAPILLARY
Glucose-Capillary: 119 mg/dL — ABNORMAL HIGH (ref 70–99)
Glucose-Capillary: 103 mg/dL — ABNORMAL HIGH (ref 70–99)
Glucose-Capillary: 49 mg/dL — ABNORMAL LOW (ref 70–99)
Glucose-Capillary: 60 mg/dL — ABNORMAL LOW (ref 70–99)
Glucose-Capillary: 83 mg/dL (ref 70–99)
Glucose-Capillary: 119 mg/dL — ABNORMAL HIGH (ref 70–99)

## 2021-01-02 LAB — SURGICAL PATHOLOGY

## 2021-01-02 NOTE — Progress Notes (Signed)
Inpatient Diabetes Program Recommendations  AACE/ADA: New Consensus Statement on Inpatient Glycemic Control (2015)  Target Ranges:  Prepandial:   less than 140 mg/dL      Peak postprandial:   less than 180 mg/dL (1-2 hours)      Critically ill patients:  140 - 180 mg/dL   Lab Results  Component Value Date   GLUCAP 103 (H) 01/02/2021   HGBA1C 7.8 (H) 08/27/2020    Review of Glycemic Control Results for ABDULAI, BLAYLOCK (MRN [id number]) as of 01/02/2021 09:41  Ref. Range 01/01/2021 07:44 01/01/2021 12:31 01/01/2021 16:49 01/01/2021 17:06 01/01/2021 18:03 01/01/2021 19:55 01/02/2021 05:21 01/02/2021 07:42  Glucose-Capillary Latest Ref Range: 70 - 99 mg/dL 73 133 (H) Novolog 9 units (6units meal coverage) 49 (L) 58 (L) 126 (H) 224 (H) 119 (H) 103 (H)    Inpatient Diabetes Program Recommendations:    Please consider decreasing Novolog meal coverage to 3 units TID with meals.    Will continue to follow while inpatient.  Thank you, Reche Dixon, RN, BSN Diabetes Coordinator Inpatient Diabetes Program [phone] (team pager from 8a-5p)

## 2021-01-02 NOTE — Progress Notes (Signed)
Occupational Therapy Treatment Patient Details Name: Patrick [name] MRN: [id number] DOB: 1965-06-28 Today's Date: 01/02/2021   History of present illness Pt is a 55 y/o male s/p L BKA on 11/11. PMH includes R BKA, HTN, DM, and gout   OT comments  Pt is slowly progressing towards OT goals. Remains limited by pain, stating it feels like "a wolverine is clawing my calf" with any movement of LLE. Initially declining OT session, however agreed to complete grooming tasks at EOB after encouragement. During session, requiring setup A for grooming and Min guard for bed mobility. Declined OOB transfer due to pain, however interested in attempting next session. Remains appropriate for SNF. Will continue to follow acutely.   Recommendations for follow up therapy are one component of a multi-disciplinary discharge planning process, led by the attending physician.  Recommendations may be updated based on patient status, additional functional criteria and insurance authorization.    Follow Up Recommendations  Skilled nursing-short term rehab (<3 hours/day)    Assistance Recommended at Discharge Intermittent Supervision/Assistance  Equipment Recommendations  Other (comment) ([place])    Recommendations for Other Services      Precautions / Restrictions Precautions Precautions: Fall Precaution Comments: bilateral BKA Restrictions Weight Bearing Restrictions: Yes LLE Weight Bearing: Non weight bearing       Mobility Bed Mobility Overal bed mobility: Needs Assistance Bed Mobility: Supine to Sit;Sit to Supine     Supine to sit: Min guard;HOB elevated Sit to supine: Min guard   General bed mobility comments: Able to use UEs to pull self up in bed    Transfers                   General transfer comment: Pt declining transfer OOB this date, initially asking "how would I even do that?" OT explained compensatory techniques for transfers which pt seemed interested in, however requesting to  attempt next session due to pain today.     Balance Overall balance assessment: Needs assistance Sitting-balance support: No upper extremity supported Sitting balance-Leahy Scale: Fair                                     ADL either performed or assessed with clinical judgement   ADL Overall ADL's : Needs assistance/impaired     Grooming: Set up;Wash/dry face;Oral care;Applying deodorant;Wash/dry hands;Sitting Grooming Details (indicate cue type and reason): Completed grooming tasks at EOB                               General ADL Comments: Limited by pain. Initially declining session, however agreed to complete grooming at EOB after encouragement.    Extremity/Trunk Assessment              Vision       Perception     Praxis      Cognition Arousal/Alertness: Awake/alert Behavior During Therapy: WFL for tasks assessed/performed Overall Cognitive Status: Within Functional Limits for tasks assessed                                            Exercises     Shoulder Instructions       General Comments      Pertinent Vitals/ Pain  Pain Assessment: 0-10 Pain Score: 9  Pain Location: L residual limb Pain Descriptors / Indicators: Grimacing;Guarding Pain Intervention(s): Limited activity within patient's tolerance;Monitored during session  Home Living                                          Prior Functioning/Environment              Frequency  Min 2X/week        Progress Toward Goals  OT Goals(current goals can now be found in the care plan section)  Progress towards OT goals: Progressing toward goals  Acute Rehab OT Goals Patient Stated Goal: SNF then home OT Goal Formulation: With patient Time For Goal Achievement: 01/14/21 Potential to Achieve Goals: Good ADL Goals Pt Will Perform Grooming: with modified independence;sitting Pt Will Perform Upper Body Bathing: with  modified independence;sitting Pt Will Perform Lower Body Bathing: with modified independence;sitting/lateral leans Pt Will Perform Upper Body Dressing: with modified independence;sitting Pt Will Perform Lower Body Dressing: with modified independence;sitting/lateral leans Pt Will Transfer to Toilet: with modified independence;with transfer board;bedside commode Pt Will Perform Toileting - Clothing Manipulation and hygiene: with modified independence;sitting/lateral leans Pt Will Perform Tub/Shower Transfer: Tub transfer;Shower transfer;with transfer board;tub bench;grab bars Pt/caregiver will Perform Home Exercise Program: Increased strength;Both right and left upper extremity;Independently Additional ADL Goal #1: Pt will verbalize/demonstrate 3 fall prevention strategies to incorporate into ADLs/ADL mobility to increase safety awareness with mod I overall. Additional ADL Goal #2: Pt will verbalize/demonstrate 2-3 skin breakdown/joint contracture prevention strategies to incorporate into ADLs/ADL mobility with mod I overall.  Plan Discharge plan remains appropriate;Frequency remains appropriate    Co-evaluation                 AM-PAC OT "6 Clicks" Daily Activity     Outcome Measure   Help from another person eating meals?: None Help from another person taking care of personal grooming?: A Little Help from another person toileting, which includes using toliet, bedpan, or urinal?: A Lot Help from another person bathing (including washing, rinsing, drying)?: A Lot Help from another person to put on and taking off regular upper body clothing?: A Little Help from another person to put on and taking off regular lower body clothing?: A Lot 6 Click Score: 16    End of Session    OT Visit Diagnosis: Unsteadiness on feet (R26.81);History of falling (Z91.81);Muscle weakness (generalized) (M62.81);Pain Pain - Right/Left: Left Pain - part of body: Leg   Activity Tolerance Patient limited  by pain   Patient Left in bed;with call bell/phone within reach   Nurse Communication Mobility status        Time: 2355-7322 OT Time Calculation (min): 15 min  Charges: OT General Charges $OT Visit: 1 Visit OT Treatments $Self Care/Home Management : 8-22 mins  Justeen Hehr C, OT/L  Acute Rehab [place] 01/02/2021, 3:23 PM

## 2021-01-02 NOTE — Progress Notes (Signed)
Hypoglycemic Event  CBG: 49  Treatment: snacks, lunch, 8 oz juice/soda  Symptoms: None  Follow-up CBG: Time:1300 CBG Result:83  Possible Reasons for Event: Unknown  Comments/MD notified: patient asymptomatic and ready to eat his lunch. Snacks and juice provided and MD notified.     Patrick [name]

## 2021-01-02 NOTE — Progress Notes (Signed)
Patient ID: Patrick [name], male   DOB: 06-09-1965, 54 y.o.   MRN: [id number] Patient is postoperative day 3 left below-knee amputation.  There is 100 cc in the wound VAC canister there is not a good suction fit there are 2 xs.  Patient feels like his best option would be to discharge to skilled nursing facility.  Hemoglobin stable at 8.0.

## 2021-01-02 NOTE — Progress Notes (Signed)
PT Cancellation Note  Patient Details Name: Patrick [name] MRN: [id number] DOB: 02-17-1966   Cancelled Treatment:    Reason Eval/Treat Not Completed: Patient declined, no reason specified (Pt. states every time he tries to move his R LE, it feels like wolverine is slicing his calf.  Requests 1 more day to try to get pain under control prior to PT.  Pt. is encouraged to attempt  therex in bed if pain is better under control later in day.) Pt. Demos understanding.  [name] A. Marrion Finan, PT, DPT Acute Rehabilitation Services Office: [place] 01/02/2021, 12:04 PM

## 2021-01-03 LAB — HEMOGLOBIN A1C
Hgb A1c MFr Bld: 8.2 % — ABNORMAL HIGH (ref 4.8–5.6)
Mean Plasma Glucose: 188.64 mg/dL

## 2021-01-03 LAB — GLUCOSE, CAPILLARY
Glucose-Capillary: 103 mg/dL — ABNORMAL HIGH (ref 70–99)
Glucose-Capillary: 102 mg/dL — ABNORMAL HIGH (ref 70–99)
Glucose-Capillary: 157 mg/dL — ABNORMAL HIGH (ref 70–99)

## 2021-01-03 NOTE — Plan of Care (Signed)
Discussed pain management and need to transition to oral pain medication for possible transfer to SNF for rehab. He verbalized understanding, but does not want to take the oxy for pain. Needs reinforcement

## 2021-01-03 NOTE — Progress Notes (Signed)
Physical Therapy Treatment Patient Details Name: Patrick [name] MRN: [id number] DOB: 04/07/1965 Today's Date: 01/03/2021   History of Present Illness Pt is a 55 y/o male s/p L BKA on 11/11. PMH includes R BKA, HTN, DM, and gout    PT Comments    Patient progressing well with mobility. Session focused on functional transfers. Performed AP transfer with Min A of 2 and use of chuck pad to assist getting into chair requiring a few scoots. Reviewed there ex/AROM of left residual limb. Pt eager to get to rehab prior to returning home due to leaving too early last time after first BKA resulting in a few falls at home. Discussed importance of elevation, exercise, pressure relief etc. Will follow.    Recommendations for follow up therapy are one component of a multi-disciplinary discharge planning process, led by the attending physician.  Recommendations may be updated based on patient status, additional functional criteria and insurance authorization.  Follow Up Recommendations  Skilled nursing-short term rehab (<3 hours/day)     Assistance Recommended at Discharge Frequent or constant Supervision/Assistance  Equipment Recommendations  Other (comment) (TBA, defer to SNF)    Recommendations for Other Services       Precautions / Restrictions Precautions Precautions: Fall Precaution Comments: bilateral BKA, wound vac Required Braces or Orthoses: Other Brace Other Brace: limb protector Restrictions Weight Bearing Restrictions: Yes LLE Weight Bearing: Non weight bearing     Mobility  Bed Mobility Overal bed mobility: Needs Assistance Bed Mobility: Supine to Sit     Supine to sit: Supervision;HOB elevated     General bed mobility comments: Able to come into long sitting without assist/difficulty.    Transfers Overall transfer level: Needs assistance Equipment used: None Transfers: Bed to chair/wheelchair/BSC         Anterior-Posterior transfers: Min assist;+2 physical  assistance   General transfer comment: Assist of 2 to perform AP transfer into recliner using bed pad for support. Cues for technique.    Ambulation/Gait               General Gait Details: Unable   Marine scientist Rankin (Stroke Patients Only)       Balance Overall balance assessment: Needs assistance Sitting-balance support: No upper extremity supported Sitting balance-Leahy Scale: Good                                      Cognition Arousal/Alertness: Awake/alert Behavior During Therapy: WFL for tasks assessed/performed Overall Cognitive Status: Within Functional Limits for tasks assessed                                 General Comments: Question understanding of medical conditions as pt with second amputation this year and requesting sugary sodas/juices.        Exercises Amputee Exercises Quad Sets: AROM;Left;5 reps;Supine Hip ABduction/ADduction: AROM;Left Hip Flexion/Marching: AROM;Left Knee Extension: AROM;Left    General Comments        Pertinent Vitals/Pain Pain Assessment: Faces Faces Pain Scale: Hurts a little bit Pain Location: L residual limb Pain Descriptors / Indicators: Guarding;Sore Pain Intervention(s): Premedicated before session;Monitored during session;Repositioned    Home Living  Prior Function            PT Goals (current goals can now be found in the care plan section) Progress towards PT goals: Progressing toward goals    Frequency    Min 2X/week      PT Plan Current plan remains appropriate    Co-evaluation              AM-PAC PT "6 Clicks" Mobility   Outcome Measure  Help needed turning from your back to your side while in a flat bed without using bedrails?: A Little Help needed moving from lying on your back to sitting on the side of a flat bed without using bedrails?: A Little Help needed moving  to and from a bed to a chair (including a wheelchair)?: A Lot Help needed standing up from a chair using your arms (e.g., wheelchair or bedside chair)?: Total Help needed to walk in hospital room?: Total Help needed climbing 3-5 steps with a railing? : Total 6 Click Score: 11    End of Session   Activity Tolerance: Patient tolerated treatment well Patient left: in chair;with call bell/phone within reach Nurse Communication: Mobility status PT Visit Diagnosis: Other abnormalities of gait and mobility (R26.89);Difficulty in walking, not elsewhere classified (R26.2)     Time: 1100-1133 PT Time Calculation (min) (ACUTE ONLY): 33 min  Charges:  $Therapeutic Activity: 23-37 mins                     Marisa Severin, PT, DPT Acute Rehabilitation Services Pager [phone] Office [phone]      Marguarite Arbour A Sabra Heck 01/03/2021, 12:02 PM

## 2021-01-03 NOTE — Progress Notes (Signed)
Client has required IV Dilaudid early in shift (in [facility]) and oxy 15mg  prior to PT transfer to chair. Client has been pleasant and denies any pain at time of this note. Up in chair, call bell in place. Voices no needs at this time

## 2021-01-03 NOTE — Progress Notes (Signed)
Inpatient Diabetes Program Recommendations  AACE/ADA: New Consensus Statement on Inpatient Glycemic Control   Target Ranges:  Prepandial:   less than 140 mg/dL      Peak postprandial:   less than 180 mg/dL (1-2 hours)      Critically ill patients:  140 - 180 mg/dL   Results for OTTO, FELKINS (MRN [id number]) as of 01/03/2021 10:46  Ref. Range 01/02/2021 05:21 01/02/2021 07:42 01/02/2021 11:59 01/02/2021 12:57 01/02/2021 15:50 01/02/2021 20:09 01/03/2021 08:06  Glucose-Capillary Latest Ref Range: 70 - 99 mg/dL 119 (H) 103 (H) 49 (L) 60 (L) 83 119 (H) 103 (H)    Review of Glycemic Control  Current orders for Inpatient glycemic control: Levemir 20 units QHS, Novolog 0-20 units TID with meals, Novolog 6 units TID with meals, Metformin 500 mg BID  Inpatient Diabetes Program Recommendations:    Insulin: May want to consider decreasing meal coverage to Novolog 3 units TID with meals.  Thanks, Barnie Alderman, RN, MSN, CDE Diabetes Coordinator Inpatient Diabetes Program [phone] (Team Pager from 8am to 5pm)

## 2021-01-03 NOTE — Progress Notes (Signed)
CBG didn't flow over to vitals flow sheet at noon glucose was 118  Patient refused the scheduled 6 units of Insulin.

## 2021-01-04 LAB — GLUCOSE, CAPILLARY
Glucose-Capillary: 105 mg/dL — ABNORMAL HIGH (ref 70–99)
Glucose-Capillary: 123 mg/dL — ABNORMAL HIGH (ref 70–99)
Glucose-Capillary: 134 mg/dL — ABNORMAL HIGH (ref 70–99)
Glucose-Capillary: 142 mg/dL — ABNORMAL HIGH (ref 70–99)

## 2021-01-04 NOTE — TOC Progression Note (Addendum)
Transition of Care [facility]) - Progression Note    Patient Details  Name: JAVIS ABBOUD MRN: [id number] Date of Birth: 1965/06/03  Transition of Care [facility]) CM/SW Contact  Sharin Mons, RN Phone Number: 01/04/2021, 3:12 PM  Clinical Narrative:    Pt remains with SNF bed offers. Case discussed with [facility] supervisor. Pt home alone , no insurance. Referral already made with [facility] for MEDICAID screening. Pt states has recently applied for Medicaid....determination pending. LOG approval received for SNF placement from [employer]. NCM made CSW aware.  TOC team will continue to monitor and assist with needs.    Expected Discharge Plan: Skilled Nursing Facility Barriers to Discharge: No SNF bed  Expected Discharge Plan and Services Expected Discharge Plan: [place] In-house Referral: Clinical Social Work     Living arrangements for the past 2 months: Hotel/Motel                                       Social Determinants of Health (SDOH) Interventions    Readmission Risk Interventions Readmission Risk Prevention Plan 08/29/2020  Transportation Screening Complete  PCP or Specialist Appt within 3-5 Days Complete  HRI or [place] Complete  Social Work Consult for [place] Planning/Counseling Complete  Palliative Care Screening Not Applicable  Medication Review Press photographer) Complete  Some recent data might be hidden

## 2021-01-04 NOTE — Progress Notes (Signed)
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 PMH includes R BKA, HTN, DM, and gout   OT comments  Pt is progressing towards OT goals. During session, pt simulated toilet transfer with lateral scoot transfer from bed to drop arm recliner with Min A. Pt also completing dressing at bed level with setup and assist to manage wound vac/lines. Remains limited by pain and activity tolerance. Continue to recommend SNF. Will continue to follow acutely.   Recommendations for follow up therapy are one component of a multi-disciplinary discharge planning process, led by the attending physician.  Recommendations may be updated based on patient status, additional functional criteria and insurance authorization.    Follow Up Recommendations  Skilled nursing-short term rehab (<3 hours/day)    Assistance Recommended at Discharge Intermittent Supervision/Assistance  Equipment Recommendations  Other (comment) (Defer)    Recommendations for Other Services      Precautions / Restrictions Precautions Precautions: Fall Precaution Comments: bilateral BKA, wound vac Required Braces or Orthoses: Other Brace Other Brace: limb protector Restrictions Weight Bearing Restrictions: Yes LLE Weight Bearing: Non weight bearing       Mobility Bed Mobility Overal bed mobility: Needs Assistance Bed Mobility: Supine to Sit     Supine to sit: Supervision;HOB elevated          Transfers Overall transfer level: Needs assistance Equipment used: None Transfers: Bed to chair/wheelchair/BSC            Lateral/Scoot Transfers: Min assist;From elevated surface       Balance Overall balance assessment: Needs assistance Sitting-balance support: No upper extremity supported Sitting balance-Leahy Scale: Good                                      ADL either performed or assessed with clinical judgement   ADL Overall ADL's : Needs assistance/impaired                 Upper Body Dressing : Set up;Bed level   Lower Body Dressing: Minimal assistance;Bed level Lower Body Dressing Details (indicate cue type and reason): Assist to manage would vac Toilet Transfer: Minimal assistance;BSC/3in1;Requires drop arm;Requires wide/bariatric Toilet Transfer Details (indicate cue type and reason): Simulated to drop arm recliner from bed. Min A to manage would vac and guard for safety. Would likely need drop arm bariatric BSC for such transfers                Extremity/Trunk Assessment              Vision       Perception     Praxis      Cognition Arousal/Alertness: Awake/alert Behavior During Therapy: WFL for tasks assessed/performed Overall Cognitive Status: Within Functional Limits for tasks assessed                                            Exercises     Shoulder Instructions       General Comments      Pertinent Vitals/ Pain       Pain Assessment: 0-10 Pain Score: 9  Pain Location: L residual limb Pain Descriptors / Indicators: Guarding;Sore Pain Intervention(s): Monitored  during session;Premedicated before session;Repositioned  Home Living                                          Prior Functioning/Environment              Frequency  Min 2X/week        Progress Toward Goals  OT Goals(current goals can now be found in the care plan section)  Progress towards OT goals: Progressing toward goals  Acute Rehab OT Goals Patient Stated Goal: SNF then home OT Goal Formulation: With patient Time For Goal Achievement: 01/14/21 Potential to Achieve Goals: Good ADL Goals Pt Will Perform Grooming: with modified independence;sitting Pt Will Perform Upper Body Bathing: with modified independence;sitting Pt Will Perform Lower Body Bathing: with modified  independence;sitting/lateral leans Pt Will Perform Upper Body Dressing: with modified independence;sitting Pt Will Perform Lower Body Dressing: with modified independence;sitting/lateral leans Pt Will Transfer to Toilet: with modified independence;with transfer board;bedside commode Pt Will Perform Toileting - Clothing Manipulation and hygiene: with modified independence;sitting/lateral leans Pt Will Perform Tub/Shower Transfer: Tub transfer;Shower transfer;with transfer board;tub bench;grab bars Pt/caregiver will Perform Home Exercise Program: Increased strength;Both right and left upper extremity;Independently Additional ADL Goal #1: Pt will verbalize/demonstrate 3 fall prevention strategies to incorporate into ADLs/ADL mobility to increase safety awareness with mod I overall. Additional ADL Goal #2: Pt will verbalize/demonstrate 2-3 skin breakdown/joint contracture prevention strategies to incorporate into ADLs/ADL mobility with mod I overall.  Plan Discharge plan remains appropriate;Frequency remains appropriate    Co-evaluation                 AM-PAC OT "6 Clicks" Daily Activity     Outcome Measure   Help from another person eating meals?: None Help from another person taking care of personal grooming?: A Little Help from another person toileting, which includes using toliet, bedpan, or urinal?: A Lot Help from another person bathing (including washing, rinsing, drying)?: A Lot Help from another person to put on and taking off regular upper body clothing?: A Little Help from another person to put on and taking off regular lower body clothing?: A Little 6 Click Score: 17    End of Session    OT Visit Diagnosis: Unsteadiness on feet (R26.81);History of falling (Z91.81);Muscle weakness (generalized) (M62.81);Pain Pain - Right/Left: Left Pain - part of body: Leg   Activity Tolerance Patient tolerated treatment well   Patient Left in chair;with call bell/phone within reach    Nurse Communication Mobility status        Time: 9833-8250 OT Time Calculation (min): 18 min  Charges: OT General Charges $OT Visit: 1 Visit OT Treatments $Therapeutic Activity: 8-22 mins  Casha Estupinan C, OT/L  Acute Rehab [phone]  Maggie Font 01/04/2021, 9:22 AM

## 2021-01-04 NOTE — Plan of Care (Signed)

## 2021-01-04 NOTE — Plan of Care (Signed)
Reinforced need for po pain medication instead of IV to get ready for DC. Education on shifting weight while in chair and bed to decrease pressure on buttocks and coccyx area. Discussed contact precautions with patient due to visitors going into room. Discussed proper hand hygiene

## 2021-01-04 NOTE — Progress Notes (Signed)
Noted elevated BP today, doesn't fall within parameters of prn medication. Will report to CN and next shift to monitor. Patient continues to complain of pain at a level 6 and above throughout today.

## 2021-01-04 NOTE — Progress Notes (Signed)
Mobility Specialist Progress Note   01/04/21 1600  Mobility  Activity Refused mobility   Pt denied bc their pain has inc to an "11.83/10" but pt did state for mobility in general they would like to have access to a w/c while here in the hospital. Will try again tomorrow w/ a w/c.   Holland Falling Mobility Specialist Phone Number [phone]

## 2021-01-04 NOTE — Progress Notes (Signed)
Patient received call from a friend stating he received a letter stating approval of Medicare Disability. He doesn't have the Medicare number tonight, but will in am to plan toward discharge.

## 2021-01-05 LAB — GLUCOSE, CAPILLARY
Glucose-Capillary: 106 mg/dL — ABNORMAL HIGH (ref 70–99)
Glucose-Capillary: 146 mg/dL — ABNORMAL HIGH (ref 70–99)
Glucose-Capillary: 101 mg/dL — ABNORMAL HIGH (ref 70–99)
Glucose-Capillary: 123 mg/dL — ABNORMAL HIGH (ref 70–99)

## 2021-01-05 NOTE — Progress Notes (Signed)
Mobility Specialist Progress Note   01/05/21 1600  Mobility  Activity Refused mobility   Pt had just received pain meds and had been feeling pain all day, wanted to just rest today.  Holland Falling Mobility Specialist Phone Number [phone]

## 2021-01-06 LAB — GLUCOSE, CAPILLARY
Glucose-Capillary: 152 mg/dL — ABNORMAL HIGH (ref 70–99)
Glucose-Capillary: 100 mg/dL — ABNORMAL HIGH (ref 70–99)
Glucose-Capillary: 114 mg/dL — ABNORMAL HIGH (ref 70–99)

## 2021-01-06 NOTE — TOC Progression Note (Signed)
Transition of Care [facility]) - Initial/Assessment Note    Patient Details  Name: Patrick [name] MRN: [id number] Date of Birth: [birth year]  Transition of Care [facility]) CM/SW Contact:    Milinda Antis, [place] Phone Number: 01/06/2021, 12:34 PM  Clinical Narrative:                 CSW attempted to contact Ms. Arthur Holms [phone] to inquire about whether the patient has been approved for Medicaid.  There was no answer.  CSW left a VM requesting a returned call.   CSW contacted [place] with [employer].  The facility cannot accept an LOG or regular medicaid  CSW contacted [place] with [facility].  The facility is reviewing the patient and Tressa Busman will inquire about whether the facility can accept an LOG.  Patient continued not to have any bed offers.   Expected Discharge Plan: Skilled Nursing Facility Barriers to Discharge: No SNF bed   Patient Goals and CMS Choice Patient states their goals for this hospitalization and ongoing recovery are:: SNF CMS Medicare.gov Compare Post Acute Care list provided to:: Patient Choice offered to / list presented to : Patient  Expected Discharge Plan and Services Expected Discharge Plan: [place] In-house Referral: Clinical Social Work     Living arrangements for the past 2 months: Hotel/Motel                                      Prior Living Arrangements/Services Living arrangements for the past 2 months: Hotel/Motel Lives with:: Self Patient language and need for interpreter reviewed:: Yes Do you feel safe going back to the place where you live?: No   SNF  Need for Family Participation in Patient Care: Yes (Comment) Care giver support system in place?: Yes (comment)   Criminal Activity/Legal Involvement Pertinent to Current Situation/Hospitalization: No - Comment as needed  Activities of Daily Living Home Assistive Devices/Equipment: Wheelchair, Environmental consultant (specify type), Cane (specify quad or straight), CBG Meter,  Eyeglasses ADL Screening (condition at time of admission) Patient's cognitive ability adequate to safely complete daily activities?: Yes Is the patient deaf or have difficulty hearing?: No Does the patient have difficulty seeing, even when wearing glasses/contacts?: No Does the patient have difficulty concentrating, remembering, or making decisions?: No Patient able to express need for assistance with ADLs?: Yes Does the patient have difficulty dressing or bathing?: No Independently performs ADLs?: Yes (appropriate for developmental age) Does the patient have difficulty walking or climbing stairs?: Yes Weakness of Legs: None Weakness of Arms/Hands: None  Permission Sought/Granted Permission sought to share information with : Case Manager, Family Supports, Chartered certified accountant granted to share information with : Yes, Verbal Permission Granted  Share Information with NAME: Coralyn Mark  Permission granted to share info w AGENCY: SNF  Permission granted to share info w Relationship: sister  Permission granted to share info w Contact Information: Coralyn Mark [phone]  Emotional Assessment Appearance:: Appears stated age Attitude/Demeanor/Rapport: Gracious Affect (typically observed): Calm Orientation: : Oriented to Self, Oriented to Place, Oriented to  Time, Oriented to Situation Alcohol / Substance Use: Not Applicable Psych Involvement: No (comment)  Admission diagnosis:  Left below-knee amputee [facility]) [P80.998] Patient Active Problem List   Diagnosis Date Noted   Left below-knee amputee ([place]) 12/30/2020   Subacute osteomyelitis, left ankle and foot ([place])    Cholelithiasis with acute cholecystitis 09/25/2020   Hypertensive urgency 09/25/2020   Constipation 09/25/2020  Thrombocytosis 09/25/2020   S/P BKA (below knee amputation) unilateral, right ([place]) 09/01/2020   ABLA (acute blood loss anemia) 09/01/2020   Anemia 08/27/2020   History of complete ray amputation of  fifth toe of right foot ([place]) 07/20/2020   Dehiscence of amputation stump ([place]) 07/20/2020   Cutaneous abscess of right foot    Osteomyelitis of right foot ([facility])    Severe protein-calorie malnutrition ([place])    Sepsis due to cellulitis ([place]) 06/04/2020   Diabetic foot ulcer ([place]) 06/04/2020   Diabetic foot infection ([place]) 06/04/2020   CKD (chronic kidney disease) stage 3, GFR 30-59 ml/min ([place]) 06/04/2020   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[place]) 06/04/2020   Acute respiratory disease due to COVID-19 virus 05/03/2019   Renal insufficiency 05/03/2019   GOUT 01/27/2010   OBESITY 01/27/2010   Insulin-requiring or dependent type II diabetes mellitus ([place]) 02/20/2008   HYPERTENSION, BENIGN ESSENTIAL 02/20/2008   PCP:  Beaulah Corin, [facility]:   [facility] [address] [place] [address] Phone: [phone] Fax: [place] [address] [place]. [place]. [place] [address] Phone: [phone] Fax: [phone]     Social Determinants of Health (SDOH) Interventions    Readmission Risk Interventions Readmission Risk Prevention Plan 08/29/2020  Transportation Screening Complete  PCP or Specialist Appt within 3-5 Days Complete  HRI or [place] Complete  Social Work Consult for [place] Planning/Counseling Complete  Palliative Care Screening Not Applicable  Medication Review Press photographer) Complete  Some recent data might be hidden

## 2021-01-06 NOTE — Progress Notes (Signed)
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 PMH includes R BKA, HTN, DM, and gout   OT comments  Pt. Seen for skilled OT session.  Declines any out of chair activity secondary to recently receiving pain meds and not feeling like he was safe to move a round a lot.  Focus of session was pressure relief, fall prevention, and introduction of HEP. Pt. Able to demo good pressure relief tech. Able to state fall prevention set up in home env.  Reviewed chair push ups with pt. As a great seated exercise that also provides pressure relief.  Current recommendations remain appropriate.     Recommendations for follow up therapy are one component of a multi-disciplinary discharge planning process, led by the attending physician.  Recommendations may be updated based on patient status, additional functional criteria and insurance authorization.    Follow Up Recommendations  Skilled nursing-short term rehab (<3 hours/day)    Assistance Recommended at Discharge Intermittent Supervision/Assistance  Equipment Recommendations  Other (comment)    Recommendations for Other Services Other (comment)    Precautions / Restrictions Precautions Precautions: Fall Precaution Comments: bilateral BKA, wound vac Required Braces or Orthoses: Other Brace Other Brace: limb protector Restrictions LLE Weight Bearing: Non weight bearing       Mobility Bed Mobility                    Transfers                         Balance                                           ADL either performed or assessed with clinical judgement   ADL Overall ADL's : Needs assistance/impaired                                       General ADL Comments: provided education on pressure relief, pt. able to demonstrate repositioning and how  he completes it throughout the day.  provided education on fall prevention with examples.  also reviewed b ue exercises.  pt. declined other activity secondary to had just received pain meds and was feeling "high"    Extremity/Trunk Assessment              Vision       Perception     Praxis      Cognition Arousal/Alertness: Awake/alert Behavior During Therapy: WFL for tasks assessed/performed Overall Cognitive Status: Within Functional Limits for tasks assessed                                            Exercises     Shoulder Instructions       General Comments  [place] fan[place].  [employer], played f[place]ball there.      Pertinent Vitals/ Pain       Pain Assessment: No/denies pain  Home Living  Prior Functioning/Environment              Frequency  Min 2X/week        Progress Toward Goals  OT Goals(current goals can now be found in the care plan section)  Progress towards OT goals: Progressing toward goals     Plan Discharge plan remains appropriate;Frequency remains appropriate    Co-evaluation                 AM-PAC OT "6 Clicks" Daily Activity     Outcome Measure   Help from another person eating meals?: None Help from another person taking care of personal grooming?: A Little Help from another person toileting, which includes using toliet, bedpan, or urinal?: A Lot Help from another person bathing (including washing, rinsing, drying)?: A Lot Help from another person to put on and taking off regular upper body clothing?: A Little Help from another person to put on and taking off regular lower body clothing?: A Little 6 Click Score: 17    End of Session    OT Visit Diagnosis: Unsteadiness on feet (R26.81);History of falling (Z91.81);Muscle weakness (generalized) (M62.81);Pain Pain - Right/Left: Left Pain - part of body: Leg   Activity  Tolerance Patient tolerated treatment well   Patient Left in chair;with call bell/phone within reach   Nurse Communication          Time: 2620-3559 OT Time Calculation (min): 19 min  Charges: OT General Charges $OT Visit: 1 Visit OT Treatments $Therapeutic Activity: 8-22 mins  [name], COTA/L Acute Rehabilitation [phone]   [name] 01/06/2021, 12:31 PM

## 2021-01-06 NOTE — Progress Notes (Signed)
Mobility Specialist Progress Note   01/06/21 1200  Mobility  Activity  (Wheel Chair Mobility)  Level of Assistance Standby assist, set-up cues, supervision of patient - no hands on  Assistive Device Wheelchair  LLE Weight Bearing NWB  Distance Ambulated (ft)  (Distance Wheeled*: 574f)  Mobility  (Wheeled w/ assistance in hallway)  Mobility Response Tolerated well  Mobility performed by Mobility specialist  Bed Position Chair  $Mobility charge 1 Mobility   Received pt in chair c/o 8/10 pain but agreeable to w/c mobility. Able to lateral scoot to w/c with just VC of hand placement. Pt wheeled around whole unit w/ no complaints of pain, fatigued towards the end of session. Returned to chair via same method w/ call bell by side and all needs met.    JHolland FallingMobility Specialist Phone Number [phone]

## 2021-01-06 NOTE — Plan of Care (Signed)

## 2021-01-06 NOTE — Progress Notes (Signed)
PT Cancellation Note  Patient Details Name: Patrick [name] MRN: [id number] DOB: 01-08-1966   Cancelled Treatment:    Reason Eval/Treat Not Completed: Patient declined, no reason specified.  Already mobilized with mobility and OT, Not another please. 01/06/2021  Ginger Carne., PT Acute Rehabilitation Services [phone]  (pager) [phone]  (office)   Tessie Fass Aruna Nestler 01/06/2021, 3:13 PM

## 2021-01-06 NOTE — Progress Notes (Signed)
Patient ID: Patrick [name], male   DOB: [birth year], 55 y.o.   MRN: [id number] Wound VAC is functioning well no new drainage.  Patient states that he has been approved for Medicaid.  Skilled nursing placement still underway.

## 2021-01-07 LAB — GLUCOSE, CAPILLARY
Glucose-Capillary: 90 mg/dL (ref 70–99)
Glucose-Capillary: 118 mg/dL — ABNORMAL HIGH (ref 70–99)
Glucose-Capillary: 101 mg/dL — ABNORMAL HIGH (ref 70–99)
Glucose-Capillary: 118 mg/dL — ABNORMAL HIGH (ref 70–99)

## 2021-01-07 NOTE — Progress Notes (Signed)
Patient ID: Patrick [name], male   DOB: [birth year], 55 y.o.   MRN: [id number] Patient is status post transtibial amputation.  There is no new drainage in the wound VAC canister.  There is a good suction fit with 2 checks.  Patient has Medicaid.  Anticipate discharge to skilled nursing.

## 2021-01-07 NOTE — Plan of Care (Signed)

## 2021-01-07 NOTE — Plan of Care (Signed)
Patient continues to require IV pain medication after education regarding the fact that this medication will not be given in SNF. Educated that needs to transition to oral pain medication. He verbalizes understanding but is hesitant. Discussed need to get out of bed into recliner for meals Pain management seems to be the highest priority

## 2021-01-07 NOTE — Progress Notes (Signed)
PT Cancellation Note  Patient Details Name: Patrick [name] MRN: [id number] DOB: Sep 23, 1965   Cancelled Treatment:    Reason Eval/Treat Not Completed: Other (comment).  Refusing all therapy including bed exercise, states he is too high, just had IV meds.  Follow along with him as ordered.   Ramond Dial 01/07/2021, 1:34 PM  Mee Hives, PT PhD Acute Rehab Dept. Number: [place] and [place]

## 2021-01-08 LAB — CBC WITH DIFFERENTIAL/PLATELET
Abs Immature Granulocytes: 0.04 10*3/uL (ref 0.00–0.07)
Basophils Absolute: 0.1 10*3/uL (ref 0.0–0.1)
Basophils Relative: 1 %
Eosinophils Absolute: 0.4 10*3/uL (ref 0.0–0.5)
Eosinophils Relative: 3 %
HCT: 21.1 % — ABNORMAL LOW (ref 39.0–52.0)
Hemoglobin: 6.8 g/dL — CL (ref 13.0–17.0)
Immature Granulocytes: 0 %
Lymphocytes Relative: 26 %
Lymphs Abs: 2.7 10*3/uL (ref 0.7–4.0)
MCH: 27.1 pg (ref 26.0–34.0)
MCHC: 32.2 g/dL (ref 30.0–36.0)
MCV: 84.1 fL (ref 80.0–100.0)
Monocytes Absolute: 0.7 10*3/uL (ref 0.1–1.0)
Monocytes Relative: 6 %
Neutro Abs: 6.4 10*3/uL (ref 1.7–7.7)
Neutrophils Relative %: 64 %
Platelets: 593 10*3/uL — ABNORMAL HIGH (ref 150–400)
RBC: 2.51 MIL/uL — ABNORMAL LOW (ref 4.22–5.81)
RDW: 17.1 % — ABNORMAL HIGH (ref 11.5–15.5)
WBC: 10.2 10*3/uL (ref 4.0–10.5)
nRBC: 0 % (ref 0.0–0.2)

## 2021-01-08 LAB — BASIC METABOLIC PANEL
Anion gap: 11 (ref 5–15)
BUN: 14 mg/dL (ref 6–20)
CO2: 23 mmol/L (ref 22–32)
Calcium: 8.1 mg/dL — ABNORMAL LOW (ref 8.9–10.3)
Chloride: 110 mmol/L (ref 98–111)
Creatinine, Ser: 0.81 mg/dL (ref 0.61–1.24)
GFR, Estimated: >60 mL/min (ref >60)
Glucose, Bld: 139 mg/dL — ABNORMAL HIGH (ref 70–99)
Potassium: 3.1 mmol/L — ABNORMAL LOW (ref 3.5–5.1)
Sodium: 144 mmol/L (ref 135–145)

## 2021-01-08 LAB — GLUCOSE, CAPILLARY
Glucose-Capillary: 90 mg/dL (ref 70–99)
Glucose-Capillary: 81 mg/dL (ref 70–99)
Glucose-Capillary: 147 mg/dL — ABNORMAL HIGH (ref 70–99)
Glucose-Capillary: 122 mg/dL — ABNORMAL HIGH (ref 70–99)

## 2021-01-08 LAB — PREPARE RBC (CROSSMATCH)

## 2021-01-08 MED ORDER — SODIUM CHLORIDE 0.9% IV SOLUTION: Freq: Once | INTRAVENOUS | Status: AC

## 2021-01-08 NOTE — Progress Notes (Signed)
Have sent secure chat to Dr.Duda regarding low H & H, stated in chat had ordered 1unit PRBC. I don't see that order, so order was placed per Dr. Sharol Given for 1 unit PRBC. He had Type and Cross 11/14. Has large bore IV in right [facility] that is patent.

## 2021-01-08 NOTE — Progress Notes (Signed)
I have reviewed the blood administration policy and procedure.

## 2021-01-08 NOTE — Progress Notes (Signed)
Orders received from  Dr. Sharol Given to remove wound vac and replace with dry dressing to change prn. Also reported to him a critical Hgb of 6.8, Hct 21.1 and RBC 2.51

## 2021-01-08 NOTE — Progress Notes (Signed)
Received orders from Dr. Sharol Given to remove wound vac and place dry dressing. This was done without difficulty. Area around wound was moist with serous fluid and dressing was saturated. There was a very strong odor. Stump cleansed with sterile saline, Kerlix applied x2, and ace to secure dressing applied. Patient was premedicated with Dilaudid and tolerated procedure well. Another RN was in the room.

## 2021-01-08 NOTE — Progress Notes (Addendum)
Patient is s/p transtibial amputation on 12/30/2020.  No new drainage in the wound VAC.  No complaints today.  Pain is controlled.  Discharge to skilled nursing is being arranged.  Suction intact to wound VAC sponge.  Patient's nurse did voice concerns over strong smell in the patient's room and states this has been getting stronger over the last several days.  There is malodorous smell in the patient room but cannot really speak to if this is new as this is my first time meeting Patrick [name].  He denies any fevers, chills, night sweats.  He does report fatigue and feels more "blah" than he did yesterday but he says he has been fatigued for several days now.  No pain medication since recent nurse came on shift so pain seems fairly well controlled at this time.  No chest pain, shortness of breath, abdominal pain.  He has had several bowel movements and denies any history of recent incontinence.  There is no new amount of drainage in the wound VAC canister and some shrinker is in good position with no sign of pressure ulcer from the wound VAC hose.  Plan to order CBC and BMP to evaluate patient's fatigue.  All of his vital signs are stable at this time.

## 2021-01-09 LAB — GLUCOSE, CAPILLARY
Glucose-Capillary: 90 mg/dL (ref 70–99)
Glucose-Capillary: 135 mg/dL — ABNORMAL HIGH (ref 70–99)
Glucose-Capillary: 124 mg/dL — ABNORMAL HIGH (ref 70–99)
Glucose-Capillary: 103 mg/dL — ABNORMAL HIGH (ref 70–99)
Glucose-Capillary: 145 mg/dL — ABNORMAL HIGH (ref 70–99)

## 2021-01-09 LAB — BASIC METABOLIC PANEL
Anion gap: 9 (ref 5–15)
BUN: 13 mg/dL (ref 6–20)
CO2: 27 mmol/L (ref 22–32)
Calcium: 8.4 mg/dL — ABNORMAL LOW (ref 8.9–10.3)
Chloride: 103 mmol/L (ref 98–111)
Creatinine, Ser: 1.23 mg/dL (ref 0.61–1.24)
GFR, Estimated: >60 mL/min (ref >60)
Glucose, Bld: 118 mg/dL — ABNORMAL HIGH (ref 70–99)
Potassium: 4.2 mmol/L (ref 3.5–5.1)
Sodium: 139 mmol/L (ref 135–145)

## 2021-01-09 LAB — HEMOGLOBIN AND HEMATOCRIT, BLOOD
HCT: 25.2 % — ABNORMAL LOW (ref 39.0–52.0)
HCT: 29.3 % — ABNORMAL LOW (ref 39.0–52.0)
Hemoglobin: 8 g/dL — ABNORMAL LOW (ref 13.0–17.0)
Hemoglobin: 9.1 g/dL — ABNORMAL LOW (ref 13.0–17.0)

## 2021-01-09 LAB — PREPARE RBC (CROSSMATCH)

## 2021-01-09 MED ORDER — SODIUM CHLORIDE 0.9% IV SOLUTION: Freq: Once | INTRAVENOUS | Status: AC

## 2021-01-09 NOTE — Progress Notes (Signed)
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 PMH includes R BKA, HTN, DM, and gout    PT Comments    Pt was seen for mobilty on side of bed and laterally scooted to the recliner with drop arm feature, no increase in pain.  Once in chair declined to elevate legs, instead reporting his pain is worse with elevation.  Dicussed the MD's goals for healing and he reported the pain changed his abiltiy to tolerate it.  Nursing was already contacted for meds and arrived to take care of him.  Follow up with him for continued strengthening and balance skills, and will work on his transfers with nursing staff to assist with his care.     Recommendations for follow up therapy are one component of a multi-disciplinary discharge planning process, led by the attending physician.  Recommendations may be updated based on patient status, additional functional criteria and insurance authorization.  Follow Up Recommendations  Skilled nursing-short term rehab (<3 hours/day)     Assistance Recommended at Discharge Frequent or constant Supervision/Assistance  Equipment Recommendations  Other (comment)    Recommendations for Other Services       Precautions / Restrictions Precautions Precautions: Fall Precaution Comments: B BK amp Required Braces or Orthoses: Other Brace Other Brace: limb protector Restrictions Weight Bearing Restrictions: Yes LLE Weight Bearing: Non weight bearing Other Position/Activity Restrictions: in pain due to having bandage changed     Mobility  Bed Mobility Overal bed mobility: Needs Assistance Bed Mobility: Supine to Sit     Supine to sit: Supervision;HOB elevated     General bed mobility comments: assisted to get up to sit and then dropped back of bed to allow smooth slide over to chair    Transfers Overall transfer level: Needs  assistance Equipment used: None Transfers: Bed to chair/wheelchair/BSC            Lateral/Scoot Transfers: Min assist;From elevated surface General transfer comment: two assist but mainly to keep the chair close to the bed    Ambulation/Gait               General Gait Details: NA   Stairs             Wheelchair Mobility    Modified Rankin (Stroke Patients Only)       Balance Overall balance assessment: Needs assistance Sitting-balance support: Bilateral upper extremity supported Sitting balance-Leahy Scale: Good Sitting balance - Comments: tendency to list backward but can control forward balance                                    Cognition Arousal/Alertness: Awake/alert Behavior During Therapy: WFL for tasks assessed/performed Overall Cognitive Status: Within Functional Limits for tasks assessed                                 General Comments: pt was very calm and motvated to get to chair, slow progression with pt doing the majority of the work.  Asked him to elevate LLE and he declined, despite conversation with him about the goals of healing        Exercises General Exercises - Lower Extremity Quad Sets: AROM;10 reps Long Arc Quad: AROM;10 reps  General Comments General comments (skin integrity, edema, etc.): Pt is up to side of bed and did lateral scoot into drop arm chair, min assist with cues for hand placement and two person to guard chair at bed      Pertinent Vitals/Pain Pain Assessment: Faces Faces Pain Scale: Hurts a little bit Breathing: normal Negative Vocalization: none Facial Expression: smiling or inexpressive Body Language: relaxed Consolability: no need to console PAINAD Score: 0 Pain Location: L residual limb Pain Descriptors / Indicators: Operative site guarding;Grimacing Pain Intervention(s): Limited activity within patient's tolerance;Monitored during session    Home Living                           Prior Function            PT Goals (current goals can now be found in the care plan section) Acute Rehab PT Goals Patient Stated Goal: to be independent Progress towards PT goals: Progressing toward goals    Frequency    Min 2X/week      PT Plan Current plan remains appropriate    Co-evaluation              AM-PAC PT "6 Clicks" Mobility   Outcome Measure  Help needed turning from your back to your side while in a flat bed without using bedrails?: A Little Help needed moving from lying on your back to sitting on the side of a flat bed without using bedrails?: A Little Help needed moving to and from a bed to a chair (including a wheelchair)?: A Lot Help needed standing up from a chair using your arms (e.g., wheelchair or bedside chair)?: Total Help needed to walk in hospital room?: Total Help needed climbing 3-5 steps with a railing? : Total 6 Click Score: 11    End of Session Equipment Utilized During Treatment: Gait belt Activity Tolerance: Patient tolerated treatment well Patient left: in chair;with call bell/phone within reach Nurse Communication: Mobility status PT Visit Diagnosis: Other abnormalities of gait and mobility (R26.89);Difficulty in walking, not elsewhere classified (R26.2)     Time: 3335-4562 PT Time Calculation (min) (ACUTE ONLY): 26 min  Charges:  $Therapeutic Activity: 23-37 mins           Ramond Dial 01/09/2021, 4:42 PM  Mee Hives, PT PhD Acute Rehab Dept. Number: [place] [facility] [place]

## 2021-01-09 NOTE — TOC Progression Note (Addendum)
Transition of Care [facility]) - Progression Note    Patient Details  Name: Patrick [name] MRN: [id number] Date of Birth: 01-09-1966  Transition of Care [facility]) CM/SW Contact  Joanne Chars, LCSW Phone Number: 01/09/2021, 3:08 PM  Clinical Narrative:   CSW LM with Tressa Busman at [facility] [place] asking if he had reviewed referral and could make bed offer with LOG as payer.  [facility] declined in hub.     Expected Discharge Plan: Skilled Nursing Facility Barriers to Discharge: No SNF bed  Expected Discharge Plan and Services Expected Discharge Plan: [place] In-house Referral: Clinical Social Work     Living arrangements for the past 2 months: Hotel/Motel                                       Social Determinants of Health (SDOH) Interventions    Readmission Risk Interventions Readmission Risk Prevention Plan 08/29/2020  Transportation Screening Complete  PCP or Specialist Appt within 3-5 Days Complete  HRI or [place] Complete  Social Work Consult for [place] Planning/Counseling Complete  Palliative Care Screening Not Applicable  Medication Review Press photographer) Complete  Some recent data might be hidden

## 2021-01-09 NOTE — Progress Notes (Signed)
Patient ID: Patrick [name], male   DOB: 1965/08/04, 55 y.o.   MRN: [id number] Patient is seen in follow-up status post transtibial amputation.  The wound VAC dressing was removed yesterday.  The dressing was changed today there was a small amount of clear serosanguineous drainage but no wound dehiscence.  A dry dressing was reapplied.  Patient's hemoglobin dropped to 6.8 and received 1 unit of packed red blood cells yesterday.  Awaiting placement for skilled nursing.

## 2021-01-09 NOTE — Plan of Care (Signed)
  Problem: Education: Goal: Knowledge of General Education information will improve Description: Including pain rating scale, medication(s)/side effects and non-pharmacologic comfort measures Outcome: Progressing   Problem: Clinical Measurements: Goal: Diagnostic test results will improve Outcome: Progressing   

## 2021-01-10 LAB — BPAM RBC
Blood Product Expiration Date: 202211282359
Blood Product Expiration Date: 202212162359
ISSUE DATE / TIME: 202211201654
ISSUE DATE / TIME: 202211211525
Unit Type and Rh: 6200
Unit Type and Rh: 8400

## 2021-01-10 LAB — TYPE AND SCREEN
ABO/RH(D): AB POS
Antibody Screen: NEGATIVE
Unit division: 0
Unit division: 0

## 2021-01-10 LAB — GLUCOSE, CAPILLARY
Glucose-Capillary: 71 mg/dL (ref 70–99)
Glucose-Capillary: 103 mg/dL — ABNORMAL HIGH (ref 70–99)
Glucose-Capillary: 129 mg/dL — ABNORMAL HIGH (ref 70–99)
Glucose-Capillary: 91 mg/dL (ref 70–99)

## 2021-01-10 NOTE — Progress Notes (Signed)
Patient ID: Patrick [name], male   DOB: [birth year], 55 y.o.   MRN: [id number] Patient is awaiting skilled nursing placement.  He received 1 unit of packed red blood cells and his hemoglobin went from 6.8-9.1 it is currently 8.0.  The VAC is off and the dressing is clean and dry.

## 2021-01-10 NOTE — TOC Progression Note (Addendum)
Transition of Care [facility]) - Progression Note    Patient Details  Name: Patrick [name] MRN: [id number] Date of Birth: [birth year]  Transition of Care [facility]) CM/SW [place], RN Phone Number: 01/10/2021, 11:13 AM  Clinical Narrative:    Case management met with the patient at the bedside to discuss transitions of care and needed SNF placement.  The patient has pending Medicaid application at this time.  The patient states that he was approved for disability a few days ago for disability date of April 2022.  At this time, he does not have pending Medicaid number.  I called and spoke with Gerhard Perches, [place] with DSS [phone] and she states that disability was approved and she should have a Medicaid number in the next few days.  I gave Arthur Holms, DSS SW my contact number for follow up.    The patient was living alone in an extended stay Hotel listed on the facesheet with available dme including electric scooter, old wheelchair and rolling walker.  The patient states that he has an available brother that lives in [place] that works full-time night shift.  The patient is aware that I will explore SNF options for the patient.  CM called and spoke with Darden Dates, CM at [facility] and she will have the DON review the patient for possible placement.  I also called and spoke with Gayla Medicus, CM at [facility] and will have Arbie Cookey, [place] at [facility] in [place] review the patient for possible offer as well.  The patient has had Wynetta Emery and [employer] vaccine for COVID in the past.  I will speak with the patient about need for booster if bed offer is made at [facility] facility in [place] since it is required at the facility.  CM and MSW with DTP Team will continue to follow the patient for SNF placement through LOG until Medicaid application approved.  01/10/2021 1147 - CM spoke with Gerhard Perches, DSS and the patient has been approved for Medicaid #  [id number] N from September 19, 2020 - September 18, 2021.  I called and updated the reviewing facilities including [facility] and [facility] in [place] so they are aware of updated coverage information.  I also re-faxed the patient out in the hub for potential facilities for Medicaid bed availability - with note regarding patient's approved Medicaid number.  The patient states that he is agreeable to COVID booster if needed.  The patient states that he is planning on using Medicaid and/or [employer] transportation and has already filled out the application and sent it for review.  CM and MSW with DTP Team will continue to explore SNF placement options.  01/10/2021 1230 - CM called and spoke with Arbie Cookey, [place] at [facility] facility and she is having the administrator at the facility review the patient for possible be offer.   Expected Discharge Plan: Skilled Nursing Facility Barriers to Discharge: SNF Pending bed offer, SNF Pending Medicaid [facility]  Expected Discharge Plan and Services Expected Discharge Plan: [place] In-house Referral: Clinical Social Work, Development worker, community Discharge Planning Services: CM Consult Post Acute Care Choice: [place] Living arrangements for the past 2 months: Hotel/Motel                                       Social Determinants of Health (SDOH) Interventions    Readmission Risk Interventions  Readmission Risk Prevention Plan 01/10/2021 08/29/2020  Transportation Screening Complete Complete  PCP or Specialist Appt within 3-5 Days - Complete  HRI or [place] - Complete  Social Work Consult for [place] Planning/Counseling - Complete  Palliative Care Screening - Not Applicable  Medication Review Press photographer) Complete Complete  PCP or Specialist appointment within 3-5 days of discharge Complete -  [place] or Home Care Consult Complete -  SW Recovery Care/Counseling Consult Complete -  Palliative  Care Screening Not Applicable -  Skilled Nursing Facility Complete -  Some recent data might be hidden

## 2021-01-10 NOTE — Progress Notes (Signed)
Occupational Therapy Treatment Patient Details Name: Patrick [name] MRN: [id number] DOB: [birth year] Today's Date: 01/10/2021   History of present illness 55 y/o male s/p L BKA on 11/11. PMH includes R BKA, HTN, DM, and gout   OT comments  Pt recently transferred to chair upon arrival, lateral scoot transfers independently bed <> chair per pt and RN report. Reviewed UB exercises and limb desensitization techniques with pt. Pt verbalizes and demonstrates understanding, reports phantom limb pain in LLE during session. Pt states he is able to don/doff limb protector, however is unable to demonstrate due to limb pain at this time. Pt limited by decreased balance, activity tolerance and pain at this time, will continue to follow acutely. Recommend SNF at d/c.   Recommendations for follow up therapy are one component of a multi-disciplinary discharge planning process, led by the attending physician.  Recommendations may be updated based on patient status, additional functional criteria and insurance authorization.    Follow Up Recommendations  Skilled nursing-short term rehab (<3 hours/day)    Assistance Recommended at Discharge Intermittent Supervision/Assistance  Equipment Recommendations  Other (comment) (defer to next venue of care, anticipate pt needing drop-arm BSC, wheelchair and shower chair)    Recommendations for Other Services PT consult    Precautions / Restrictions Precautions Precautions: Fall Precaution Comments: B BK amp Required Braces or Orthoses: Other Brace Other Brace: limb protector Restrictions Weight Bearing Restrictions: Yes LLE Weight Bearing: Non weight bearing       Mobility Bed Mobility               General bed mobility comments: up in chair upon arrival    Transfers Overall transfer level: Needs assistance Equipment used: None               General transfer comment: scoots from bed <> chair back and forth independently per RN      Balance Overall balance assessment: Needs assistance Sitting-balance support: Bilateral upper extremity supported Sitting balance-Leahy Scale: Normal                                     ADL either performed or assessed with clinical judgement   ADL Overall ADL's : Needs assistance/impaired Eating/Feeding: Set up;Sitting   Grooming: Set up;Sitting   Upper Body Bathing: Set up;Sitting   Lower Body Bathing: Min guard;Sitting/lateral leans   Upper Body Dressing : Set up;Sitting   Lower Body Dressing: Min guard;Sitting/lateral leans                 General ADL Comments: pt notes he plans to be fitted for prosthetic for BLE's    Extremity/Trunk Assessment Upper Extremity Assessment Upper Extremity Assessment: Overall WFL for tasks assessed   Lower Extremity Assessment Lower Extremity Assessment: Defer to PT evaluation        Vision   Vision Assessment?: No apparent visual deficits   Perception Perception Perception: Not tested   Praxis Praxis Praxis: Not tested    Cognition Arousal/Alertness: Awake/alert Behavior During Therapy: WFL for tasks assessed/performed Overall Cognitive Status: Within Functional Limits for tasks assessed                                            Exercises Exercises: General Upper Extremity General Exercises - Upper Extremity Chair Push Up: AROM;Seated  Shoulder Instructions       General Comments Reviewed limb desensitization strategies with pt, pt verbalizes/demonstrates understanding. Pt verbalizes understanding of donning/doffing limb protector, however reports increased pain with touching limb at this time. Demonstrated exercises provided in previous session.    Pertinent Vitals/ Pain       Pain Assessment: Faces Pain Score: 3  Faces Pain Scale: Hurts little more Pain Location: L residual limb Pain Descriptors / Indicators: Other (Comment) (describes LLE pain as "scratching") Pain  Intervention(s): Premedicated before session  Home Living                                          Prior Functioning/Environment              Frequency  Min 2X/week        Progress Toward Goals  OT Goals(current goals can now be found in the care plan section)  Progress towards OT goals: Progressing toward goals  Acute Rehab OT Goals Patient Stated Goal: SNF then home OT Goal Formulation: With patient Time For Goal Achievement: 01/14/21 Potential to Achieve Goals: Good ADL Goals Pt Will Perform Grooming: with modified independence;sitting Pt Will Perform Upper Body Bathing: with modified independence;sitting Pt Will Perform Lower Body Bathing: with modified independence;sitting/lateral leans Pt Will Perform Upper Body Dressing: with modified independence;sitting Pt Will Perform Lower Body Dressing: with modified independence;sitting/lateral leans Pt Will Transfer to Toilet: with modified independence;with transfer board;bedside commode Pt Will Perform Tub/Shower Transfer: Tub transfer;Shower transfer;with transfer board;tub bench;grab bars Pt/caregiver will Perform Home Exercise Program: Increased strength;Both right and left upper extremity;Independently Additional ADL Goal #1: Pt will verbalize/demonstrate 3 fall prevention strategies to incorporate into ADLs/ADL mobility to increase safety awareness with mod I overall. Additional ADL Goal #2: Pt will verbalize/demonstrate 2-3 skin breakdown/joint contracture prevention strategies to incorporate into ADLs/ADL mobility with mod I overall.  Plan Discharge plan remains appropriate;Frequency remains appropriate    Co-evaluation                 AM-PAC OT "6 Clicks" Daily Activity     Outcome Measure   Help from another person eating meals?: None Help from another person taking care of personal grooming?: A Little Help from another person toileting, which includes using toliet, bedpan, or urinal?:  A Little Help from another person bathing (including washing, rinsing, drying)?: A Little Help from another person to put on and taking off regular upper body clothing?: A Little Help from another person to put on and taking off regular lower body clothing?: A Little 6 Click Score: 19    End of Session    OT Visit Diagnosis: Unsteadiness on feet (R26.81);History of falling (Z91.81);Muscle weakness (generalized) (M62.81);Pain Pain - Right/Left: Left Pain - part of body: Leg   Activity Tolerance Patient tolerated treatment well   Patient Left in chair;with call bell/phone within reach   Nurse Communication Mobility status        Time: 9518-8416 OT Time Calculation (min): 22 min  Charges: OT General Charges $OT Visit: 1 Visit OT Treatments $Self Care/Home Management : 8-22 mins  Lynnda Child, OTD, OTR/L Acute Rehab [phone]) [phone] - [place] 01/10/2021, 11:45 AM

## 2021-01-10 NOTE — Plan of Care (Signed)

## 2021-01-11 LAB — GLUCOSE, CAPILLARY
Glucose-Capillary: 104 mg/dL — ABNORMAL HIGH (ref 70–99)
Glucose-Capillary: 120 mg/dL — ABNORMAL HIGH (ref 70–99)

## 2021-01-11 LAB — SARS CORONAVIRUS 2 (TAT 6-24 HRS): SARS Coronavirus 2: NEGATIVE

## 2021-01-11 MED ORDER — OXYCODONE-ACETAMINOPHEN 5-325 MG PO TABS: 1.0000 | ORAL_TABLET | ORAL | 0 refills | Status: DC | PRN

## 2021-01-11 NOTE — Plan of Care (Signed)
  Problem: Education: Goal: Knowledge of General Education information will improve Description Including pain rating scale, medication(s)/side effects and non-pharmacologic comfort measures Outcome: Progressing   

## 2021-01-11 NOTE — Progress Notes (Addendum)
Mobility Specialist Progress Note   01/11/21 1100  Mobility  Activity Refused mobility   Pt requested for me to come back around 2pm. Will follow up again around then.   [place] Falling Mobility Specialist Phone Number [phone]

## 2021-01-11 NOTE — Progress Notes (Addendum)
Patient will go to [facility], room [address]. Patient will be transported via [facility] - pick up has been scheduled for 1:30pm.  CSW spoke with Meredith Mody, Network engineer to request her assistance with completing discharge paperwork - she is agreeable.  Dr. Sharol Given placed COVID test order for patient.  CSW spoke with patient via phone to provide him with an update.  Madilyn Fireman, MSW, LCSW Transitions of Care  Clinical Social Worker II [phone]

## 2021-01-11 NOTE — Discharge Summary (Signed)
Discharge Diagnoses:  Active Problems:   Subacute osteomyelitis, left ankle and foot ([place])   Left below-knee amputee [facility])   Surgeries: Procedure(s): LEFT BELOW KNEE AMPUTATION on 12/30/2020    Consultants:   Discharged Condition: Improved  Hospital Course: BRAIDON CHERMAK is an 55 y.o. male who was admitted 12/30/2020 with a chief complaint of osteomyelitis left foot, with a final diagnosis of Osteomyelitis Left Foot.  Patient was brought to the operating room on 12/30/2020 and underwent Procedure(s): LEFT BELOW KNEE AMPUTATION.    Patient was given perioperative antibiotics:  Anti-infectives (From admission, onward)    Start     Dose/Rate Route Frequency Ordered Stop   12/30/20 2000  ceFAZolin (ANCEF) IVPB 2g/100 mL premix        2 g 200 mL/hr over 30 Minutes Intravenous Every 8 hours 12/30/20 1711 12/31/20 1112   12/30/20 1130  ceFAZolin (ANCEF) IVPB 3g/100 mL premix  Status:  Discontinued        3 g 200 mL/hr over 30 Minutes Intravenous On call to O.R. 12/30/20 0936 12/30/20 1710     .  Patient was given sequential compression devices, early ambulation, and aspirin for DVT prophylaxis.  Recent vital signs: Patient Vitals for the past 24 hrs:  BP Temp Temp src Pulse Resp SpO2  01/10/21 2044 (!) 160/96 98.2 F (36.8 C) Oral 88 16 100 %  01/10/21 1441 (!) 164/101 98.2 F (36.8 C) Oral 90 17 100 %  01/10/21 0822 (!) 179/115 98.3 F (36.8 C) Oral 94 17 100 %  .  Recent laboratory studies: No results found.  Discharge Medications:   Allergies as of 01/11/2021   No Known Allergies      Medication List     STOP taking these medications    allopurinol 100 MG tablet Commonly known as: ZYLOPRIM   docusate sodium 100 MG capsule Commonly known as: Colace   doxycycline 100 MG capsule Commonly known as: VIBRAMYCIN   doxycycline 100 MG tablet Commonly known as: VIBRA-TABS   FeroSul 325 (65 FE) MG tablet Generic drug: ferrous sulfate   Hibiclens 4 % external  liquid Generic drug: chlorhexidine   traMADol-acetaminophen 37.5-325 MG tablet Commonly known as: Ultracet       TAKE these medications    acetaminophen 500 MG tablet Commonly known as: TYLENOL Take 1,000 mg by mouth every 6 (six) hours as needed for mild pain or headache.   BD Pen Needle Nano U/F 32G X 4 MM Misc Generic drug: Insulin Pen Needle Use as directed   Insulin Pen Needle 32G X 4 MM Misc Use as directed   CertaVite/Antioxidants Tabs Take 1 tablet by mouth daily.   furosemide 20 MG tablet Commonly known as: Lasix Take 1 tablet (20 mg total) by mouth daily as needed. What changed: when to take this   gabapentin 300 MG capsule Commonly known as: NEURONTIN Take 1 capsule (300 mg total) by mouth 3 (three) times daily. What changed:  when to take this reasons to take this   indomethacin 50 MG capsule Commonly known as: INDOCIN Take 50 mg by mouth 3 (three) times daily as needed for moderate pain.   insulin lispro 100 UNIT/ML KwikPen Commonly known as: HUMALOG Inject 5 Units into the skin 3 (three) times daily.   Lantus SoloStar 100 UNIT/ML Solostar Pen Generic drug: insulin glargine Inject 25 Units into the skin 2 (two) times daily.   metFORMIN 500 MG tablet Commonly known as: GLUCOPHAGE Take 500 mg by mouth 2 (two)  times daily.   metoprolol tartrate 25 MG tablet Commonly known as: LOPRESSOR Take 0.5 tablets (12.5 mg total) by mouth 2 (two) times daily.   NovoLOG FlexPen 100 UNIT/ML FlexPen Generic drug: insulin aspart Inject 4 Units into the skin 3 (three) times daily with meals.   oxyCODONE-acetaminophen 5-325 MG tablet Commonly known as: PERCOCET/ROXICET Take 1 tablet by mouth every 4 (four) hours as needed.   polyethylene glycol powder 17 GM/SCOOP powder Commonly known as: GLYCOLAX/MIRALAX Take 17 g by mouth daily as needed for moderate constipation.   tiZANidine 4 MG tablet Commonly known as: ZANAFLEX Take 1 tablet (4 mg total) by mouth  2 (two) times daily as needed for muscle spasms.   vitamin C 1000 MG tablet Take 1 tablet (1,000 mg total) by mouth daily.   Zinc Sulfate 220 (50 Zn) MG Tabs Take 1 tablet (220 mg total) by mouth daily.        Diagnostic Studies: No results found.  Patient benefited maximally from their hospital stay and there were no complications.     Disposition: Discharge disposition: 03-Skilled Nursing Facility      Discharge Instructions     Call MD / Call 911   Complete by: As directed    If you experience chest pain or shortness of breath, CALL 911 and be transported to the hospital emergency room.  If you develope a fever above 101 F, pus (white drainage) or increased drainage or redness at the wound, or calf pain, call your surgeon's office.   Constipation Prevention   Complete by: As directed    Drink plenty of fluids.  Prune juice may be helpful.  You may use a stool softener, such as Colace (over the counter) 100 mg twice a day.  Use MiraLax (over the counter) for constipation as needed.   Diet - low sodium heart healthy   Complete by: As directed    Increase activity slowly as tolerated   Complete by: As directed    Post-operative opioid taper instructions:   Complete by: As directed    POST-OPERATIVE OPIOID TAPER INSTRUCTIONS: It is important to wean off of your opioid medication as soon as possible. If you do not need pain medication after your surgery it is ok to stop day one. Opioids include: Codeine, Hydrocodone(Norco, Vicodin), Oxycodone(Percocet, oxycontin) and hydromorphone amongst others.  Long term and even short term use of opiods can cause: Increased pain response Dependence Constipation Depression Respiratory depression And more.  Withdrawal symptoms can include Flu like symptoms Nausea, vomiting And more Techniques to manage these symptoms Hydrate well Eat regular healthy meals Stay active Use relaxation techniques(deep breathing, meditating,  yoga) Do Not substitute Alcohol to help with tapering If you have been on opioids for less than two weeks and do not have pain than it is ok to stop all together.  Plan to wean off of opioids This plan should start within one week post op of your joint replacement. Maintain the same interval or time between taking each dose and first decrease the dose.  Cut the total daily intake of opioids by one tablet each day Next start to increase the time between doses. The last dose that should be eliminated is the evening dose.          Follow-up Information     [name], MD Follow up in 1 week(s).   Specialty: Orthopedic Surgery Contact information: [address] [place] [address] [phone]  [facility]. Schedule an appointment as soon as possible for a visit.   Specialty: Internal Medicine Why: Please follow up with your primary care provider once you are discharged from the skilled nursing facility and return home. Contact information: [place] [address] [phone]                  Signed: [name] 01/11/2021, 6:55 AM

## 2021-01-13 ENCOUNTER — Other Ambulatory Visit: Payer: Self-pay | Admitting: Orthopedic Surgery

## 2021-01-18 ENCOUNTER — Ambulatory Visit (INDEPENDENT_AMBULATORY_CARE_PROVIDER_SITE_OTHER): Payer: Medicaid Other | Admitting: Family

## 2021-01-18 ENCOUNTER — Encounter: Payer: Self-pay | Admitting: Family

## 2021-01-18 DIAGNOSIS — Z89512 Acquired absence of left leg below knee: Secondary | ICD-10-CM

## 2021-01-18 NOTE — Progress Notes (Signed)
Post-Op Visit Note   Patient: Patrick [name]           Date of Birth: 1965/12/23           MRN: [id number] Visit Date: 01/18/2021 PCP: [facility]  Chief Complaint:  Chief Complaint  Patient presents with   Left Leg - Routine Post Op    Left bka 12/30/20 Wound vac d/c at SNF, pt s/p fall from wheelchair X1 with direct impact of limb  Not wearing shrinker     HPI:  HPI The is a 55 year old gentleman seen status post left below-knee amputation November 11. Ortho Exam On examination of the left residual limb this is well approximated staples is nearly well-healed there is a length of about 3 cm laterally but has not yet healed this does not gape there is no drainage no erythema  Visit Diagnoses: No diagnosis found.  Plan: Begin wearing shrinker.  Continue daily Dial soap cleansing daily.  Dry dressing with Ace wrap or shrinker with direct skin contact he will follow-up in 1 more week with staple removal at that time  Follow-Up Instructions: No follow-ups on file.   Imaging: No results found.  Orders:  No orders of the defined types were placed in this encounter.  No orders of the defined types were placed in this encounter.    PMFS History: Patient Active Problem List   Diagnosis Date Noted   Left below-knee amputee ([place]) 12/30/2020   Subacute osteomyelitis, left ankle and foot ([place])    Cholelithiasis with acute cholecystitis 09/25/2020   Hypertensive urgency 09/25/2020   Constipation 09/25/2020   Thrombocytosis 09/25/2020   S/P BKA (below knee amputation) unilateral, right ([place]) 09/01/2020   ABLA (acute blood loss anemia) 09/01/2020   Anemia 08/27/2020   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 ([place]) 07/20/2020   Dehiscence of amputation stump ([place]) 07/20/2020   Cutaneous abscess of right foot    Osteomyelitis of right foot ([facility])    Severe protein-calorie malnutrition ([place])    Sepsis due to cellulitis ([place]) 06/04/2020   Diabetic foot  ulcer ([place]) 06/04/2020   Diabetic foot infection ([place]) 06/04/2020   CKD (chronic kidney disease) stage 3, GFR 30-59 ml/min ([place]) 06/04/2020   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[place]) 06/04/2020   Acute respiratory disease due to COVID-19 virus 05/03/2019   Renal insufficiency 05/03/2019   GOUT 01/27/2010   OBESITY 01/27/2010   Insulin-requiring or dependent type II diabetes mellitus ([place]) 02/20/2008   HYPERTENSION, BENIGN ESSENTIAL 02/20/2008   Past Medical History:  Diagnosis Date   Anemia    Diabetes mellitus    Gout    History of COVID-19 04/2019   Hypertension    PVD (peripheral vascular disease) ([place])    s/p R BKA    Family History  Problem Relation Age of Onset   Diabetes Neg Hx     Past Surgical History:  Procedure Laterality Date   AMPUTATION Right 06/06/2020   Procedure: AMPUTATION 5TH RAY;  Surgeon: [name], MD;  Location: [facility];  Service: Orthopedics;  Laterality: Right;   AMPUTATION Right 06/08/2020   Procedure: RIGHT 4TH RAY AMPUTATION;  Surgeon: [name], MD;  Location: [facility];  Service: Orthopedics;  Laterality: Right;   AMPUTATION Right 08/27/2020   Procedure: AMPUTATION BELOW KNEE;  Surgeon: Jessy Oto, MD;  Location: [facility];  Service: Orthopedics;  Laterality: Right;   AMPUTATION Left 12/30/2020   Procedure: LEFT BELOW KNEE AMPUTATION;  Surgeon: [name], MD;  Location: [facility];  Service: Orthopedics;  Laterality: Left;   CHOLECYSTECTOMY N/A 09/25/2020   Procedure: LAPAROSCOPIC CHOLECYSTECTOMY;  Surgeon: Jesusita Oka, MD;  Location: [facility];  Service: General;  Laterality: N/A;   Social History   Occupational History   Occupation: logistics  Tobacco Use   Smoking status: Never   Smokeless tobacco: Never  Vaping Use   Vaping Use: Never used  Substance and Sexual Activity   Alcohol use: No   Drug use: No   Sexual activity: Not Currently

## 2021-01-26 ENCOUNTER — Encounter: Payer: Medicaid Other | Admitting: Orthopedic Surgery

## 2021-01-30 ENCOUNTER — Encounter: Payer: Medicaid Other | Admitting: Orthopedic Surgery

## 2021-02-01 ENCOUNTER — Other Ambulatory Visit: Payer: Self-pay | Admitting: Orthopedic Surgery

## 2021-02-02 ENCOUNTER — Ambulatory Visit (INDEPENDENT_AMBULATORY_CARE_PROVIDER_SITE_OTHER): Payer: Medicaid Other | Admitting: Orthopedic Surgery

## 2021-02-02 DIAGNOSIS — Z89512 Acquired absence of left leg below knee: Secondary | ICD-10-CM

## 2021-02-03 NOTE — Progress Notes (Signed)
Office Visit Note   Patient: Patrick [name]           Date of Birth: 02-26-1965           MRN: [id number] Visit Date: 02/02/2021              Requested by: [facility] [address] [place],   [address] PCP: [place]  Chief Complaint  Patient presents with   Left Leg - Routine Post Op    12/30/20 left BKA  pt states that he had a fall last week with direct impact on residual limb       HPI: Patient is a 55 year old gentleman who is 4 weeks status post a left transtibial amputation on December 30, 2020.  Patient states that the lateral incision has opened a little with some yellow drainage.  Patient states that he fell a few weeks ago with direct impact on his leg.  Assessment & Plan: Visit Diagnoses: No diagnosis found.  Plan: Patient states that he is getting his prosthesis soon.  Staples are harvested today.  Follow-Up Instructions: Return in about 3 weeks (around 02/23/2021).   Ortho Exam  Patient is alert, oriented, no adenopathy, well-dressed, normal affect, normal respiratory effort. Examination there is some mild ischemic changes along the incision there is some mild eschar over the lateral aspect of the amputations.  Patient is currently in a motorized wheelchair and skilled nursing.  Patient is a new left transtibial  amputee.  Patient's current comorbidities are not expected to impact the ability to function with the prescribed prosthesis. Patient verbally communicates a strong desire to use a prosthesis. Patient currently requires mobility aids to ambulate without a prosthesis.  Expects not to use mobility aids with a new prosthesis.  Patient is a K3 level ambulator that spends a lot of time walking around on uneven terrain over obstacles, up and down stairs, and ambulates with a variable cadence.     Imaging: No results found. No images are attached to the encounter.  Labs: Lab Results  Component Value Date   HGBA1C 8.2 (H)  12/31/2020   HGBA1C 7.8 (H) 08/27/2020   HGBA1C >15.5 (H) 06/06/2020   ESRSEDRATE 95 (H) 09/17/2020   ESRSEDRATE >140 (H) 08/26/2020   ESRSEDRATE 105 (H) 06/04/2020   CRP 19.0 (H) 09/17/2020   CRP 18.3 (H) 08/26/2020   CRP 24.4 (H) 06/04/2020   LABURIC 11.2 (H) 08/29/2020   LABURIC 9.1 (H) 06/09/2020   REPTSTATUS 09/22/2020 FINAL 09/17/2020   GRAMSTAIN  06/06/2020    RARE WBC PRESENT, PREDOMINANTLY PMN ABUNDANT GRAM POSITIVE COCCI RARE GRAM POSITIVE RODS    CULT  09/17/2020    NO GROWTH 5 DAYS Performed at [facility], [place] [address] [place], [place] [address]    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8(LL)  HCT 39.0 - 52.0 % 29.3(L) 25.2(L) 21.1(L)  PLT 150 - 400 K/uL - - 593(H)  NEUTROABS 1.7 - 7.7 K/uL - - 6.4  LYMPHSABS 0.7 - 4.0 K/uL - - 2.7     There is no height or weight on file to calculate BMI.  Orders:  No orders of the defined types were placed in this encounter.  No orders of the defined types were placed in this encounter.    Procedures: No procedures performed  Clinical Data: No additional findings.  ROS:  All other systems negative, except as noted in the HPI. Review of Systems  Objective: Vital Signs: There were no vitals taken for this visit.  Specialty Comments:  No specialty comments available.  PMFS  History: Patient Active Problem List   Diagnosis Date Noted   Left below-knee amputee ([place]) 12/30/2020   Subacute osteomyelitis, left ankle and foot ([place])    Cholelithiasis with acute cholecystitis 09/25/2020   Hypertensive urgency 09/25/2020   Constipation 09/25/2020   Thrombocytosis 09/25/2020   S/P BKA (below knee amputation) unilateral, right ([place]) 09/01/2020   ABLA (acute blood loss anemia) 09/01/2020   Anemia 08/27/2020   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 ([place]) 07/20/2020   Dehiscence of amputation stump ([place]) 07/20/2020   Cutaneous abscess of right foot    Osteomyelitis of right foot (HCC)    Severe protein-calorie malnutrition ([place])    Sepsis due to cellulitis ([place]) 06/04/2020   Diabetic foot ulcer ([place]) 06/04/2020   Diabetic foot infection ([place]) 06/04/2020   CKD (chronic kidney disease) stage 3, GFR 30-59 ml/min ([place]) 06/04/2020   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[place]) 06/04/2020   Acute respiratory disease due to COVID-19 virus 05/03/2019   Renal insufficiency 05/03/2019   GOUT 01/27/2010   OBESITY 01/27/2010   Insulin-requiring or dependent type II diabetes mellitus ([place]) 02/20/2008   HYPERTENSION, BENIGN ESSENTIAL 02/20/2008   Past Medical History:  Diagnosis Date   Anemia    Diabetes mellitus    Gout    History of COVID-19 04/2019   Hypertension    PVD (peripheral vascular disease) ([place])    s/p R BKA    Family History  Problem Relation Age of Onset   Diabetes Neg Hx     Past Surgical History:  Procedure Laterality Date   AMPUTATION Right 06/06/2020   Procedure: AMPUTATION 5TH RAY;  Surgeon: [name], MD;  Location: [place];  Service: Orthopedics;  Laterality: Right;   AMPUTATION Right 06/08/2020   Procedure: RIGHT 4TH RAY AMPUTATION;  Surgeon: [name], MD;  Location: [place];  Service: Orthopedics;  Laterality: Right;   AMPUTATION Right 08/27/2020   Procedure: AMPUTATION BELOW KNEE;  Surgeon: Jessy Oto, MD;  Location: [place];   Service: Orthopedics;  Laterality: Right;   AMPUTATION Left 12/30/2020   Procedure: LEFT BELOW KNEE AMPUTATION;  Surgeon: [name], MD;  Location: [place];  Service: Orthopedics;  Laterality: Left;   CHOLECYSTECTOMY N/A 09/25/2020   Procedure: LAPAROSCOPIC CHOLECYSTECTOMY;  Surgeon: Jesusita Oka, MD;  [facility];  Service: General;  Laterality: N/A;   Social History   Occupational History   Occupation: logistics  Tobacco Use   Smoking status: Never   Smokeless tobacco: Never  Vaping Use   Vaping Use: Never used  Substance and Sexual Activity   Alcohol use: No   Drug  use: No   Sexual activity: Not Currently

## 2021-02-23 ENCOUNTER — Other Ambulatory Visit: Payer: Self-pay

## 2021-02-23 ENCOUNTER — Ambulatory Visit (INDEPENDENT_AMBULATORY_CARE_PROVIDER_SITE_OTHER): Payer: Medicaid Other | Admitting: Orthopedic Surgery

## 2021-02-23 DIAGNOSIS — Z89511 Acquired absence of right leg below knee: Secondary | ICD-10-CM

## 2021-02-27 ENCOUNTER — Encounter: Payer: Self-pay | Admitting: Orthopedic Surgery

## 2021-02-27 ENCOUNTER — Other Ambulatory Visit: Payer: Self-pay | Admitting: Orthopedic Surgery

## 2021-02-27 NOTE — Progress Notes (Signed)
Office Visit Note   Patient: Patrick [name]           Date of Birth: 1965-05-09           MRN: [id number] Visit Date: 02/23/2021              [facility]ed by: [facility] [address] [place],  [place] [address] PCP: [place]  Chief Complaint  Patient presents with   Left Leg - Routine Post Op    12/30/20 left BKA       HPI: Patient is a 56 year old gentleman who presents 7 weeks status post left transtibial amputation.  Patient states that he is picking up his prosthesis for the right tomorrow.  Patient currently using [employer].  Assessment & Plan: Visit Diagnoses:  1. Hx of BKA, right ([place])     Plan: Patient is given a prescription for physical therapy he is currently at [facility].  He is given a written prescription for doxycycline.  Dry dressing is applied and patient will proceed with dry dressing changes daily with the shrinker on top.  Follow-Up Instructions: Return in about 4 weeks (around 03/23/2021).   Ortho Exam  Patient is alert, oriented, no adenopathy, well-dressed, normal affect, normal respiratory effort. Examination there is fibrinous tissue along the surgical incision secondary to swelling.  After the fibrinous tissue was debrided there was some healthy granulation tissues on the left transtibial amputation.  The right transtibial amputation is well-healed.  Imaging: No results found. No images are attached to the encounter.  Labs: Lab Results  Component Value Date   HGBA1C 8.2 (H) 12/31/2020   HGBA1C 7.8 (H) 08/27/2020   HGBA1C >15.5 (H) 06/06/2020   ESRSEDRATE 95 (H) 09/17/2020   ESRSEDRATE >140 (H) 08/26/2020   ESRSEDRATE 105 (H) 06/04/2020   CRP 19.0 (H) 09/17/2020   CRP 18.3 (H) 08/26/2020   CRP 24.4 (H) 06/04/2020   LABURIC 11.2 (H) 08/29/2020   LABURIC 9.1 (H) 06/09/2020   REPTSTATUS 09/22/2020 FINAL 09/17/2020   GRAMSTAIN  06/06/2020    RARE WBC PRESENT, PREDOMINANTLY PMN ABUNDANT GRAM POSITIVE COCCI RARE GRAM POSITIVE  RODS    CULT  09/17/2020    NO GROWTH 5 DAYS Performed at [facility], [place] [address] [place], [place] [address]    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8(LL)  HCT 39.0 - 52.0 % 29.3(L) 25.2(L) 21.1(L)  PLT 150 - 400 K/uL - - 593(H)  NEUTROABS 1.7 - 7.7 K/uL - - 6.4  LYMPHSABS 0.7 - 4.0 K/uL - - 2.7     There is no height or weight on file to calculate BMI.  Orders:  Orders Placed This Encounter  Procedures   Ambulatory referral to Physical Therapy  No orders of the defined types were placed in this encounter.    Procedures: No procedures performed  Clinical Data: No additional findings.  ROS:  All other systems negative, except as noted in the HPI. Review of Systems  Objective: Vital Signs: There were no vitals taken for this visit.  Specialty Comments:  No specialty comments available.  PMFS History: Patient Active Problem List   Diagnosis Date Noted   Left below-knee amputee ([place]) 12/30/2020   Subacute osteomyelitis, left ankle and foot ([place])    Cholelithiasis with acute cholecystitis 09/25/2020   Hypertensive urgency 09/25/2020   Constipation 09/25/2020   Thrombocytosis 09/25/2020   S/P BKA (below knee amputation) unilateral, right ([place]) 09/01/2020   ABLA (acute blood loss anemia) 09/01/2020   Anemia 08/27/2020    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 ([place]) 07/20/2020   Dehiscence of amputation stump ([place]) 07/20/2020   Cutaneous abscess of right foot    Osteomyelitis of right foot (HCC)    Severe protein-calorie malnutrition ([place])    Sepsis due to cellulitis ([place]) 06/04/2020   Diabetic foot ulcer ([place]) 06/04/2020   Diabetic foot infection ([place]) 06/04/2020   CKD (chronic kidney disease) stage 3, GFR 30-59 ml/min ([place]) 06/04/2020   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[place]) 06/04/2020   Acute respiratory disease due to COVID-19 virus 05/03/2019   Renal insufficiency 05/03/2019   GOUT 01/27/2010   OBESITY 01/27/2010   Insulin-requiring or dependent type II diabetes mellitus ([place]) 02/20/2008   HYPERTENSION, BENIGN ESSENTIAL 02/20/2008   Past Medical History:  Diagnosis Date   Anemia    Diabetes mellitus    Gout    History of COVID-19 04/2019   Hypertension    PVD (peripheral vascular disease) ([place])    s/p R BKA    Family History  Problem Relation Age of Onset   Diabetes Neg Hx     Past Surgical History:  Procedure Laterality Date   AMPUTATION Right 06/06/2020   Procedure: AMPUTATION 5TH RAY;  Surgeon: [name], MD;  Location: [place];  Service: Orthopedics;  Laterality: Right;   AMPUTATION Right 06/08/2020   Procedure: RIGHT 4TH RAY AMPUTATION;  Surgeon: [name], MD;  Location: [facility];  Service: Orthopedics;  Laterality: Right;   AMPUTATION Right 08/27/2020   Procedure: AMPUTATION BELOW KNEE;  Surgeon: Jessy Oto, MD;  Location: [facility];  Service: Orthopedics;  Laterality: Right;   AMPUTATION Left 12/30/2020   Procedure: LEFT BELOW KNEE AMPUTATION;  Surgeon: [name], MD;  Location: [facility];  Service: Orthopedics;  Laterality: Left;   CHOLECYSTECTOMY N/A 09/25/2020   Procedure: LAPAROSCOPIC CHOLECYSTECTOMY;  Surgeon: Jesusita Oka, MD;  [facility];  Service: General;  Laterality: N/A;   Social History   Occupational History   Occupation: logistics  Tobacco  Use   Smoking status: Never   Smokeless tobacco: Never  Vaping Use   Vaping Use: Never used  Substance and Sexual Activity   Alcohol use: No   Drug use: No   Sexual activity: Not Currently

## 2021-03-23 ENCOUNTER — Encounter: Payer: Self-pay | Admitting: Orthopedic Surgery

## 2021-03-23 ENCOUNTER — Other Ambulatory Visit: Payer: Self-pay

## 2021-03-23 ENCOUNTER — Ambulatory Visit (INDEPENDENT_AMBULATORY_CARE_PROVIDER_SITE_OTHER): Payer: Medicaid Other | Admitting: Orthopedic Surgery

## 2021-03-23 DIAGNOSIS — Z89511 Acquired absence of right leg below knee: Secondary | ICD-10-CM

## 2021-03-23 DIAGNOSIS — Z89512 Acquired absence of left leg below knee: Secondary | ICD-10-CM

## 2021-03-26 ENCOUNTER — Encounter: Payer: Self-pay | Admitting: Orthopedic Surgery

## 2021-03-26 NOTE — Progress Notes (Signed)
Office Visit Note   Patient: Patrick [name]           Date of Birth: 03/24/1965           MRN: [id number] Visit Date: 03/23/2021              [facility]ed by: [facility] [address] [place],  [place] [address] PCP: [place]  Chief Complaint  Patient presents with   Left Leg - Routine Post Op    12/30/20 left BKA       HPI: Patient is a 56 year old gentleman who is almost 32-month status post left transtibial amputation he is currently wearing the shrinker and is on doxycycline.  Still has significant swelling with wound breakdown laterally of the left transtibial amputation.  Assessment & Plan: Visit Diagnoses:  1. Hx of BKA, right ([place])   2. Left below-knee amputee [facility])     Plan: Patient is given a prescription for [employer] for prosthetic fitting his right prosthesis fits well.  Follow-Up Instructions: Return in about 4 weeks (around 04/20/2021).   Ortho Exam  Patient is alert, oriented, no adenopathy, well-dressed, normal affect, normal respiratory effort. Examination patient has healthy granulation tissue he still has significant swelling but is showing good interval improvement.  Patient is a new left transtibial  amputee.  Patient's current comorbidities are not expected to impact the ability to function with the prescribed prosthesis. Patient verbally communicates a strong desire to use a prosthesis. Patient currently requires mobility aids to ambulate without a prosthesis.  Expects not to use mobility aids with a new prosthesis.  Patient is a K3 level ambulator that spends a lot of time walking around on uneven terrain over obstacles, up and down stairs, and ambulates with a variable cadence.     Imaging: No results found.    Labs: Lab Results  Component Value Date   HGBA1C 8.2 (H) 12/31/2020   HGBA1C 7.8 (H) 08/27/2020   HGBA1C >15.5 (H) 06/06/2020   ESRSEDRATE 95 (H) 09/17/2020   ESRSEDRATE >140 (H) 08/26/2020   ESRSEDRATE 105 (H)  06/04/2020   CRP 19.0 (H) 09/17/2020   CRP 18.3 (H) 08/26/2020   CRP 24.4 (H) 06/04/2020   LABURIC 11.2 (H) 08/29/2020   LABURIC 9.1 (H) 06/09/2020   REPTSTATUS 09/22/2020 FINAL 09/17/2020   GRAMSTAIN  06/06/2020    RARE WBC PRESENT, PREDOMINANTLY PMN ABUNDANT GRAM POSITIVE COCCI RARE GRAM POSITIVE RODS    CULT  09/17/2020    NO GROWTH 5 DAYS Performed at [facility], [place] [address] [place], [place] [address]    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8(LL)  HCT 39.0 - 52.0 % 29.3(L) 25.2(L) 21.1(L)  PLT 150 - 400 K/uL - -  593(H)  NEUTROABS 1.7 - 7.7 K/uL - - 6.4  LYMPHSABS 0.7 - 4.0 K/uL - - 2.7     There is no height or weight on file to calculate BMI.  Orders:  No orders of the defined types were placed in this encounter.  No orders of the defined types were placed in this encounter.    Procedures: No procedures performed  Clinical Data: No additional findings.  ROS:  All other systems negative, except as noted in the HPI. Review of Systems  Objective: Vital Signs: There were no vitals taken for this visit.  Specialty Comments:  No specialty comments available.  PMFS History: Patient Active Problem List   Diagnosis Date Noted   Left below-knee amputee ([place]) 12/30/2020   Subacute osteomyelitis, left ankle and foot ([place])     Cholelithiasis with acute cholecystitis 09/25/2020   Hypertensive urgency 09/25/2020   Constipation 09/25/2020   Thrombocytosis 09/25/2020   S/P BKA (below knee amputation) unilateral, right ([place]) 09/01/2020   ABLA (acute blood loss anemia) 09/01/2020   Anemia 08/27/2020   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 ([place]) 07/20/2020   Dehiscence of amputation stump ([place]) 07/20/2020   Cutaneous abscess of right foot    Osteomyelitis of right foot (HCC)    Severe protein-calorie malnutrition ([place])    Sepsis due to cellulitis ([place]) 06/04/2020   Diabetic foot ulcer ([place]) 06/04/2020   Diabetic foot infection ([place]) 06/04/2020   CKD (chronic kidney disease) stage 3, GFR 30-59 ml/min ([place]) 06/04/2020   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[place]) 06/04/2020   Acute respiratory disease due to COVID-19 virus 05/03/2019   Renal insufficiency 05/03/2019   GOUT 01/27/2010   OBESITY 01/27/2010   Insulin-requiring or dependent type II diabetes mellitus ([place]) 02/20/2008   HYPERTENSION, BENIGN ESSENTIAL 02/20/2008   Past Medical History:  Diagnosis Date   Anemia    Diabetes mellitus    Gout    History of COVID-19 04/2019   Hypertension    PVD (peripheral vascular disease) ([place])    s/p R BKA    Family History  Problem Relation Age of Onset   Diabetes Neg Hx     Past Surgical History:  Procedure Laterality Date   AMPUTATION Right 06/06/2020   Procedure: AMPUTATION 5TH RAY;  Surgeon: [name], MD;  Location: [place];  Service: Orthopedics;  Laterality: Right;   AMPUTATION Right 06/08/2020   Procedure: RIGHT 4TH RAY AMPUTATION;  Surgeon: [name], MD;  Location: [place];  Service: Orthopedics;  Laterality: Right;   AMPUTATION Right 08/27/2020   Procedure: AMPUTATION BELOW KNEE;  Surgeon: Jessy Oto, MD;  Location: [place];  Service: Orthopedics;  Laterality: Right;   AMPUTATION Left 12/30/2020   Procedure: LEFT BELOW KNEE AMPUTATION;  Surgeon: [name], MD;  Location: [place];   Service: Orthopedics;  Laterality: Left;   CHOLECYSTECTOMY N/A 09/25/2020   Procedure: LAPAROSCOPIC CHOLECYSTECTOMY;  Surgeon: Jesusita Oka, MD;  [facility];  Service: General;  Laterality: N/A;   Social History   Occupational History   Occupation: logistics  Tobacco Use   Smoking status: Never   Smokeless tobacco: Never  Vaping Use   Vaping Use: Never used  Substance and Sexual Activity   Alcohol use: No   Drug use: No   Sexual activity: Not Currently

## 2021-04-20 ENCOUNTER — Ambulatory Visit (INDEPENDENT_AMBULATORY_CARE_PROVIDER_SITE_OTHER): Payer: Medicaid Other | Admitting: Orthopedic Surgery

## 2021-04-20 DIAGNOSIS — Z89512 Acquired absence of left leg below knee: Secondary | ICD-10-CM

## 2021-04-20 DIAGNOSIS — Z89511 Acquired absence of right leg below knee: Secondary | ICD-10-CM

## 2021-04-25 ENCOUNTER — Encounter: Payer: Self-pay | Admitting: Orthopedic Surgery

## 2021-04-25 ENCOUNTER — Telehealth: Payer: Self-pay | Admitting: Orthopedic Surgery

## 2021-04-25 NOTE — Progress Notes (Signed)
? ?Office Visit Note ?  ?Patient: Patrick [name]           ?Date of Birth: 1966-01-22           ?MRN: [id number] ?Visit Date: 04/20/2021 ?             ?Requested by: [facility] ?[place],  [place] [address] ?PCP: [facility] ? ?Chief Complaint  ?Patient presents with  ? Left Leg - Follow-up  ?  12/30/20 left BKA  ? ? ? ? ?HPI: ?Patient is 4 months status post left transtibial amputation with a current right transtibial amputation.  Patient states his prosthesis will be ready later this month.  Patient states he still has swelling. ? ?Assessment & Plan: ?Visit Diagnoses:  ?1. Left below-knee amputee ([place])   ?2. Hx of BKA, right ([place])   ? ? ?Plan: Patient was given a application for handicap parking.  Recommended continue to use compression to help with the swelling.  Patient will also need a new liner and shrinker on the right. ? ?Follow-Up Instructions: Return in about 4 weeks (around 05/18/2021).  ? ?Ortho Exam ? ?Patient is alert, oriented, no adenopathy, well-dressed, normal affect, normal respiratory effort. ?Examination patient has healthy granulation tissue over the residual limb left transtibial amputation laterally.  There is no odor no drainage there is swelling with pitting edema. ? ?Patient is a new left transtibial  amputee. ? ?Patient's current comorbidities are not expected to impact the ability to function with the prescribed prosthesis. ?Patient verbally communicates a strong desire to use a prosthesis. ?Patient currently requires mobility aids to ambulate without a prosthesis.  Expects not to use mobility aids with a new prosthesis. ? ?Patient is a K2 level ambulator that will use a prosthesis to walk around their home and the community over low level environmental barriers.  ? ?Patient is an existing right transtibial  amputee. ? ?Patient's current comorbidities are not expected to impact the ability to function with the prescribed prosthesis. ?Patient verbally communicates a  strong desire to use a prosthesis. ?Patient currently requires mobility aids to ambulate without a prosthesis.  Expects not to use mobility aids with a new prosthesis. ? ?Patient is a K2 level ambulator that will use a prosthesis to walk around their home and the community over low level environmental barriers.  ? ? ? ? ? ?Imaging: ?No results found. ? ? ?Labs: ?Lab Results  ?Component Value Date  ? HGBA1C 8.2 (H) 12/31/2020  ? HGBA1C 7.8 (H) 08/27/2020  ? HGBA1C >15.5 (H) 06/06/2020  ? ESRSEDRATE 95 (H) 09/17/2020  ? ESRSEDRATE >140 (H) 08/26/2020  ? ESRSEDRATE 105 (H) 06/04/2020  ? CRP 19.0 (H) 09/17/2020  ? CRP 18.3 (H) 08/26/2020  ? CRP 24.4 (H) 06/04/2020  ? LABURIC 11.2 (H) 08/29/2020  ? LABURIC 9.1 (H) 06/09/2020  ? REPTSTATUS 09/22/2020 FINAL 09/17/2020  ? GRAMSTAIN  06/06/2020  ?  RARE WBC PRESENT, PREDOMINANTLY PMN ?ABUNDANT GRAM POSITIVE COCCI ?RARE GRAM POSITIVE RODS ?  ? CULT  09/17/2020  ?  NO GROWTH 5 DAYS ?Performed at [facility], [place] [address], [place], [place] [address] ?  ? 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 ? ? ?Lab Results  ?Component Value Date  ? ALBUMIN 2.3 (L) 09/26/2020  ? ALBUMIN 2.5 (L) 09/24/2020  ? ALBUMIN 2.6 (L) 09/17/2020  ? PREALBUMIN 8.2 (L) 09/17/2020  ? PREALBUMIN 5.1 (L) 06/04/2020  ? ? ?Lab Results  ?Component Value Date  ?  MG 1.3 (L) 09/29/2020  ? MG 1.3 (L) 06/06/2020  ? MG 1.7 05/03/2019  ? ?No results found for: VD25OH ? ?Lab Results  ?Component Value Date  ? PREALBUMIN 8.2 (L) 09/17/2020  ? PREALBUMIN 5.1 (L) 06/04/2020  ? ?CBC EXTENDED Latest Ref Rng & Units 01/09/2021 01/09/2021 01/08/2021  ?WBC 4.0 - 10.5 K/uL - - 10.2  ?RBC 4.22 - 5.81 MIL/uL - - 2.51(L)  ?HGB 13.0 - 17.0 g/dL 9.1(L) 8.0(L) 6.8(LL)  ?HCT 39.0 - 52.0 % 29.3(L) 25.2(L) 21.1(L)  ?PLT 150 - 400 K/uL - - 593(H)  ?NEUTROABS 1.7 - 7.7 K/uL - - 6.4  ?LYMPHSABS 0.7 - 4.0 K/uL - - 2.7  ? ? ? ?There is no height or weight on file to calculate BMI. ? ?Orders:  ?No orders of the  defined types were placed in this encounter. ? ?No orders of the defined types were placed in this encounter. ? ? ? Procedures: ?No procedures performed ? ?Clinical Data: ?No additional findings. ? ?ROS: ? ?All other systems negative, except as noted in the HPI. ?Review of Systems ? ?Objective: ?Vital Signs: There were no vitals taken for this visit. ? ?Specialty Comments:  ?No specialty comments available. ? ?PMFS History: ?Patient Active Problem List  ? Diagnosis Date Noted  ? Left below-knee amputee ([place]) 12/30/2020  ? Subacute osteomyelitis, left ankle and foot ([place])   ? Cholelithiasis with acute cholecystitis 09/25/2020  ? Hypertensive urgency 09/25/2020  ? Constipation 09/25/2020  ? Thrombocytosis 09/25/2020  ? S/P BKA (below knee amputation) unilateral, right ([place]) 09/01/2020  ? ABLA (acute blood loss anemia) 09/01/2020  ? Anemia 08/27/2020  ? 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 ([place]) 07/20/2020  ? Dehiscence of amputation stump ([place]) 07/20/2020  ? Cutaneous abscess of right foot   ? Osteomyelitis of right foot ([place])   ? Severe protein-calorie malnutrition ([place])   ? Sepsis due to cellulitis ([place]) 06/04/2020  ? Diabetic foot ulcer ([place]) 06/04/2020  ? Diabetic foot infection ([place]) 06/04/2020  ? CKD (chronic kidney disease) stage 3, GFR 30-59 ml/min (H[facility]) 06/04/2020  ? 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[place]) 06/04/2020  ? Acute respiratory disease due to COVID-19 virus 05/03/2019  ? Renal insufficiency 05/03/2019  ? GOUT 01/27/2010  ? OBESITY 01/27/2010  ? Insulin-requiring or dependent type II diabetes mellitus ([place]) 02/20/2008  ? HYPERTENSION, BENIGN ESSENTIAL 02/20/2008  ? ?Past Medical History:  ?Diagnosis Date  ? Anemia   ? Diabetes mellitus   ? Gout   ? History of COVID-19 04/2019  ? Hypertension   ? PVD (peripheral vascular disease) ([place])   ? s/p R BKA  ?  ?Family History  ?Problem Relation Age of Onset  ? Diabetes Neg Hx   ?  ?Past Surgical History:  ?Procedure Laterality Date  ? AMPUTATION  Right 06/06/2020  ? Procedure: AMPUTATION 5TH RAY;  Surgeon: [name], MD;  Location: [facility];  Service: Orthopedics;  Laterality: Right;  ? AMPUTATION Right 06/08/2020  ? Procedure: RIGHT 4TH RAY AMPUTATION;  Surgeon: [name], MD;  Location: [facility];  Service: Orthopedics;  Laterality: Right;  ? AMPUTATION Right 08/27/2020  ? Procedure: AMPUTATION BELOW KNEE;  Surgeon: Jessy Oto, MD;  Location: [facility];  Service: Orthopedics;  Laterality: Right;  ? AMPUTATION Left 12/30/2020  ? Procedure: LEFT BELOW KNEE AMPUTATION;  Surgeon: [name], MD;  Location: [facility];  Service: Orthopedics;  Laterality: Left;  ? CHOLECYSTECTOMY N/A 09/25/2020  ? Procedure: LAPAROSCOPIC CHOLECYSTECTOMY;  Surgeon: Jesusita Oka, MD;  Location: [facility];  Service: General;  Laterality: N/A;  ? ?Social History  ? ?Occupational History  ? Occupation: logistics  ?Tobacco Use  ? Smoking status: Never  ? Smokeless tobacco: Never  ?Vaping Use  ? Vaping Use: Never used  ?Substance and Sexual Activity  ? Alcohol use: No  ? Drug use: No  ? Sexual activity: Not Currently  ? ? ? ? ? ?

## 2021-04-25 NOTE — Telephone Encounter (Signed)
Patrick [name] called and has some questions about pt prothesis. She wants to know if the interface is suppose to be worn at all times.  ?

## 2021-04-27 ENCOUNTER — Telehealth: Payer: Self-pay

## 2021-04-27 NOTE — Telephone Encounter (Signed)
I called and lm on vm for patient. There was no number taken in this phone message. There is no context who Glenard Haring is to the patient. If she is a therapist, friend I do no have any information. I asked the pt to call the office and ask to speak to me and let me know what questions he has about his prosthetic. I have not heard of what is referenced in this message below but I will do what I can to help.  ?

## 2021-04-27 NOTE — Telephone Encounter (Signed)
Pt called back and advised that he needs someone to call [facility] and advise that it is ok for him to ear his prosthetic liner. Phon e# - Glenard Haring is the wound care nurse and his physical therapist is Apolonio Schneiders. Both of them wanted to make sure that it was ok for pt to wear this even if he does not yet have the prosthetic. ?

## 2021-04-28 NOTE — Telephone Encounter (Signed)
Called [phone] sw Glenard Haring and she advised that the pt wanted to wear the liner and PT wants to remove while they work with him. I advised that this is fine and she will call with any other questions.  ?

## 2021-05-15 ENCOUNTER — Telehealth: Payer: Self-pay

## 2021-05-15 NOTE — Telephone Encounter (Signed)
Angel at [facility] would like to know if patient is able to [facility] with Left prosthetic leg even though he has an open wound that Dr. Sharol Given is aware? Would like a CB. Please advise. ? ?S/P L BKA 12/30/2020 ? ? ? ?CB [phone] ?

## 2021-05-15 NOTE — Telephone Encounter (Signed)
Please advise 

## 2021-05-16 NOTE — Telephone Encounter (Signed)
Angel informed.

## 2021-05-18 ENCOUNTER — Ambulatory Visit: Payer: Medicaid Other | Admitting: Orthopedic Surgery

## 2021-05-23 ENCOUNTER — Encounter: Payer: Self-pay | Admitting: Family

## 2021-05-23 ENCOUNTER — Ambulatory Visit (INDEPENDENT_AMBULATORY_CARE_PROVIDER_SITE_OTHER): Payer: Medicaid Other | Admitting: Family

## 2021-05-23 DIAGNOSIS — I890 Lymphedema, not elsewhere classified: Secondary | ICD-10-CM | POA: Diagnosis not present

## 2021-05-23 DIAGNOSIS — I739 Peripheral vascular disease, unspecified: Secondary | ICD-10-CM

## 2021-05-23 DIAGNOSIS — Z89512 Acquired absence of left leg below knee: Secondary | ICD-10-CM

## 2021-05-23 NOTE — Progress Notes (Addendum)
? ?Office Visit Note ?  ?Patient: Patrick [name]           ?Date of Birth: 1965-09-14           ?MRN: [id number] ?Visit Date: 05/23/2021 ?             ?Requested by: [facility] ?[place],  [place] [address] ?PCP: [facility] ? ?No chief complaint on file. ? ? ? ? ?HPI: ?The patient is a 56 year old seen today in follow-up he is status post left below-knee amputation November 11 of last year he has been very slow to heal.  He continues with his shrinker for compression he states he still has quite a bit of serous drainage she does not have any pain he wonders when he can begin weightbearing in his prosthesis ? ?Assessment & Plan: ?Visit Diagnoses: No diagnosis found. ? ?Plan: Patient may begin weightbearing as tolerated he will closely monitor the ulcer.  He may use some antibacterial ointment and a bandage when he is not in his prosthesis discussed also using the medical compression strip shrinker underneath his socket with direct skin contact may resume physical therapy ? ?We will also see if we can get him set up with flex touch lymphedema pumps to assist with his lower extremity lymphedema. ? ?Follow-Up Instructions: No follow-ups on file.  ? ?Ortho Exam ? ?Patient is alert, oriented, no adenopathy, well-dressed, normal affect, normal respiratory effort.  Residual limb tight with edema this is moderate ?On examination of the left residual limb this is well-healed from a surgical standpoint however he does have an ulcer along his incision this is 4 cm in length open about 2 cm there is no depth no active drainage no surrounding erythema the wound is filled in with 100% granulation tissue ? ?Imaging: ?No results found. ?No images are attached to the encounter. ? ?Labs: ?Lab Results  ?Component Value Date  ? HGBA1C 8.2 (H) 12/31/2020  ? HGBA1C 7.8 (H) 08/27/2020  ? HGBA1C >15.5 (H) 06/06/2020  ? ESRSEDRATE 95 (H) 09/17/2020  ? ESRSEDRATE >140 (H) 08/26/2020  ? ESRSEDRATE 105 (H) 06/04/2020  ?  CRP 19.0 (H) 09/17/2020  ? CRP 18.3 (H) 08/26/2020  ? CRP 24.4 (H) 06/04/2020  ? LABURIC 11.2 (H) 08/29/2020  ? LABURIC 9.1 (H) 06/09/2020  ? REPTSTATUS 09/22/2020 FINAL 09/17/2020  ? GRAMSTAIN  06/06/2020  ?  RARE WBC PRESENT, PREDOMINANTLY PMN ?ABUNDANT GRAM POSITIVE COCCI ?RARE GRAM POSITIVE RODS ?  ? CULT  09/17/2020  ?  NO GROWTH 5 DAYS ?Performed at [facility], [place] [address] [place], [place] [address] ?  ? 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 ? ? ?Lab Results  ?Component Value Date  ? ALBUMIN 2.3 (L) 09/26/2020  ? ALBUMIN 2.5 (L) 09/24/2020  ? ALBUMIN 2.6 (L) 09/17/2020  ? PREALBUMIN 8.2 (L) 09/17/2020  ? PREALBUMIN 5.1 (L) 06/04/2020  ? ? ?Lab Results  ?Component Value Date  ? MG 1.3 (L) 09/29/2020  ? MG 1.3 (L) 06/06/2020  ? MG 1.7 05/03/2019  ? ?No results found for: VD25OH ? ?Lab Results  ?Component Value Date  ? PREALBUMIN 8.2 (L) 09/17/2020  ? PREALBUMIN 5.1 (L) 06/04/2020  ? ? ?  Latest Ref Rng & Units 01/09/2021  ?  9:16 PM 01/09/2021  ? 12:12 PM 01/08/2021  ? 11:33 AM  ?CBC EXTENDED  ?WBC 4.0 - 10.5 K/uL   10.2    ?RBC 4.22 - 5.81 MIL/uL   2.51    ?  Hemoglobin 13.0 - 17.0 g/dL 9.1   8.0   6.8    ?HCT 39.0 - 52.0 % 29.3   25.2   21.1    ?Platelets 150 - 400 K/uL   593    ?NEUT# 1.7 - 7.7 K/uL   6.4    ?Lymph# 0.7 - 4.0 K/uL   2.7    ? ? ? ?There is no height or weight on file to calculate BMI. ? ?Orders:  ?No orders of the defined types were placed in this encounter. ? ?No orders of the defined types were placed in this encounter. ? ? ? Procedures: ?No procedures performed ? ?Clinical Data: ?No additional findings. ? ?ROS: ? ?All other systems negative, except as noted in the HPI. ?Review of Systems ? ?Objective: ?Vital Signs: There were no vitals taken for this visit. ? ?Specialty Comments:  ?No specialty comments available. ? ?PMFS History: ?Patient Active Problem List  ? Diagnosis Date Noted  ? Left below-knee amputee ([place]) 12/30/2020  ? Subacute osteomyelitis, left  ankle and foot ([place])   ? Cholelithiasis with acute cholecystitis 09/25/2020  ? Hypertensive urgency 09/25/2020  ? Constipation 09/25/2020  ? Thrombocytosis 09/25/2020  ? S/P BKA (below knee amputation) unilateral, right ([place]) 09/01/2020  ? ABLA (acute blood loss anemia) 09/01/2020  ? Anemia 08/27/2020  ? 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 ([place]) 07/20/2020  ? Dehiscence of amputation stump ([place]) 07/20/2020  ? Cutaneous abscess of right foot   ? Osteomyelitis of right foot ([place])   ? Severe protein-calorie malnutrition ([place])   ? Sepsis due to cellulitis ([place]) 06/04/2020  ? Diabetic foot ulcer ([place]) 06/04/2020  ? Diabetic foot infection ([place]) 06/04/2020  ? CKD (chronic kidney disease) stage 3, GFR 30-59 ml/min (HCC) 06/04/2020  ? 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[place]) 06/04/2020  ? Acute respiratory disease due to COVID-19 virus 05/03/2019  ? Renal insufficiency 05/03/2019  ? GOUT 01/27/2010  ? OBESITY 01/27/2010  ? Insulin-requiring or dependent type II diabetes mellitus ([place]) 02/20/2008  ? HYPERTENSION, BENIGN ESSENTIAL 02/20/2008  ? ?Past Medical History:  ?Diagnosis Date  ? Anemia   ? Diabetes mellitus   ? Gout   ? History of COVID-19 04/2019  ? Hypertension   ? PVD (peripheral vascular disease) ([place])   ? s/p R BKA  ?  ?Family History  ?Problem Relation Age of Onset  ? Diabetes Neg Hx   ?  ?Past Surgical History:  ?Procedure Laterality Date  ? AMPUTATION Right 06/06/2020  ? Procedure: AMPUTATION 5TH RAY;  Surgeon: [name], MD;  Location: [facility];  Service: Orthopedics;  Laterality: Right;  ? AMPUTATION Right 06/08/2020  ? Procedure: RIGHT 4TH RAY AMPUTATION;  Surgeon: [name], MD;  Location: [facility];  Service: Orthopedics;  Laterality: Right;  ? AMPUTATION Right 08/27/2020  ? Procedure: AMPUTATION BELOW KNEE;  Surgeon: Jessy Oto, MD;  Location: [facility];  Service: Orthopedics;  Laterality: Right;  ? AMPUTATION Left 12/30/2020  ? Procedure: LEFT BELOW KNEE AMPUTATION;  Surgeon: [name],  MD;  Location: [facility];  Service: Orthopedics;  Laterality: Left;  ? CHOLECYSTECTOMY N/A 09/25/2020  ? Procedure: LAPAROSCOPIC CHOLECYSTECTOMY;  Surgeon: Jesusita Oka, MD;  Location: [facility];  Service: General;  Laterality: N/A;  ? ?Social History  ? ?Occupational History  ? Occupation: logistics  ?Tobacco Use  ? Smoking status: Never  ? Smokeless tobacco: Never  ?Vaping Use  ? Vaping Use: Never used  ?Substance and Sexual  Activity  ? Alcohol use: No  ? Drug use: No  ? Sexual activity: Not Currently  ? ? ? ? ? ?

## 2021-06-01 ENCOUNTER — Other Ambulatory Visit: Payer: Self-pay | Admitting: Internal Medicine

## 2021-06-02 LAB — COMPLETE METABOLIC PANEL WITH GFR
AG Ratio: 1.3 (calc) (ref 1.0–2.5)
ALT: 11 U/L (ref 9–46)
AST: 18 U/L (ref 10–35)
Albumin: 3.6 g/dL (ref 3.6–5.1)
Alkaline phosphatase (APISO): 90 U/L (ref 35–144)
BUN/Creatinine Ratio: 15 (calc) (ref 6–22)
BUN: 42 mg/dL — ABNORMAL HIGH (ref 7–25)
CO2: 22 mmol/L (ref 20–32)
Calcium: 8.9 mg/dL (ref 8.6–10.3)
Chloride: 108 mmol/L (ref 98–110)
Creat: 2.79 mg/dL — ABNORMAL HIGH (ref 0.70–1.30)
Globulin: 2.8 g/dL (calc) (ref 1.9–3.7)
Glucose, Bld: 87 mg/dL (ref 65–99)
Potassium: 4.2 mmol/L (ref 3.5–5.3)
Sodium: 143 mmol/L (ref 135–146)
Total Bilirubin: 0.6 mg/dL (ref 0.2–1.2)
Total Protein: 6.4 g/dL (ref 6.1–8.1)
eGFR: 26 mL/min/{1.73_m2} — ABNORMAL LOW (ref >=60)

## 2021-06-02 LAB — CBC
HCT: 33.4 % — ABNORMAL LOW (ref 38.5–50.0)
Hemoglobin: 10.8 g/dL — ABNORMAL LOW (ref 13.2–17.1)
MCH: 27.2 pg (ref 27.0–33.0)
MCHC: 32.3 g/dL (ref 32.0–36.0)
MCV: 84.1 fL (ref 80.0–100.0)
MPV: 9 fL (ref 7.5–12.5)
Platelets: 319 10*3/uL (ref 140–400)
RBC: 3.97 10*6/uL — ABNORMAL LOW (ref 4.20–5.80)
RDW: 15.7 % — ABNORMAL HIGH (ref 11.0–15.0)
WBC: 7.1 10*3/uL (ref 3.8–10.8)

## 2021-06-02 LAB — LIPID PANEL
Cholesterol: 186 mg/dL (ref <200)
HDL: 55 mg/dL (ref >=40)
LDL Cholesterol (Calc): 115 mg/dL (calc) — ABNORMAL HIGH
Non-HDL Cholesterol (Calc): 131 mg/dL (calc) — ABNORMAL HIGH (ref <130)
Total CHOL/HDL Ratio: 3.4 (calc) (ref <5.0)
Triglycerides: 68 mg/dL (ref <150)

## 2021-06-02 LAB — TSH: TSH: 3.08 mIU/L (ref 0.40–4.50)

## 2021-06-02 LAB — URIC ACID: Uric Acid, Serum: 9.3 mg/dL — ABNORMAL HIGH (ref 4.0–8.0)

## 2021-06-02 LAB — PSA: PSA: 0.55 ng/mL (ref <=4.00)

## 2021-06-02 LAB — VITAMIN D 25 HYDROXY (VIT D DEFICIENCY, FRACTURES): Vit D, 25-Hydroxy: 22 ng/mL — ABNORMAL LOW (ref 30–100)

## 2021-06-06 ENCOUNTER — Ambulatory Visit: Payer: Medicaid Other | Admitting: Family

## 2021-06-08 ENCOUNTER — Ambulatory Visit (INDEPENDENT_AMBULATORY_CARE_PROVIDER_SITE_OTHER): Payer: Medicaid Other | Admitting: Family

## 2021-06-08 DIAGNOSIS — Z89511 Acquired absence of right leg below knee: Secondary | ICD-10-CM | POA: Diagnosis not present

## 2021-06-08 DIAGNOSIS — Z89512 Acquired absence of left leg below knee: Secondary | ICD-10-CM

## 2021-06-08 DIAGNOSIS — I890 Lymphedema, not elsewhere classified: Secondary | ICD-10-CM

## 2021-06-22 ENCOUNTER — Ambulatory Visit: Payer: Medicaid Other | Admitting: Orthopedic Surgery

## 2021-06-22 ENCOUNTER — Ambulatory Visit: Payer: Medicaid Other | Attending: Orthopedic Surgery | Admitting: Rehabilitation

## 2021-06-22 DIAGNOSIS — R2689 Other abnormalities of gait and mobility: Secondary | ICD-10-CM | POA: Insufficient documentation

## 2021-06-22 DIAGNOSIS — R2681 Unsteadiness on feet: Secondary | ICD-10-CM | POA: Insufficient documentation

## 2021-06-22 DIAGNOSIS — M6281 Muscle weakness (generalized): Secondary | ICD-10-CM | POA: Insufficient documentation

## 2021-06-27 ENCOUNTER — Encounter: Payer: Self-pay | Admitting: Family

## 2021-06-27 NOTE — Progress Notes (Addendum)
Office Visit Note   Patient: Patrick [name]           Date of Birth: Aug 28, 1965           MRN: [id number] Visit Date: 06/08/2021              Requested by: [facility], [facility] [address] [place],  [place] [address] PCP: [facility]  Chief Complaint  Patient presents with   Left Leg - Follow-up    12/30/2021 left BKA        HPI: The patient is a 56 year old seen today in follow-up he is status post left below-knee amputation November 11 of last year. he has been very slow to heal.  He continues with his shrinker for compression.  As per Dr. Jess Barters instruction has begun weightbearing in his prostheses as tolerated.  He has noted a decrease in his swelling improvement in his wound he is quite pleased with his progress.  He is primarily in a motorized scooter but has begun ambulating is working with therapy.  For his ongoing issues with lymphedema as well as edema he has tried months of compression, exercise (is currently in physical therapy and doing a home exercise program) and elevation without reduction or resolution of his edema  We are currently working on getting him set up with lymphedema pumps.  He has had significant volume loss in his right residual limb and no longer fits well in his current prosthetic he fears falling he has a poor fit.  He has been having to wear multiple layers of ply  Patient is an existing bilateral transtibial  amputee.  Patient's current comorbidities are not expected to impact the ability to function with the prescribed prosthesis. Patient verbally communicates a strong desire to use a prosthesis. Patient currently requires mobility aids to ambulate without a prosthesis.  Expects not to use mobility aids with a new prosthesis.  Patient is a K3 level ambulator that spends a lot of time walking around on uneven terrain over obstacles, up and down stairs, and ambulates with a variable cadence.    Assessment & Plan: Visit Diagnoses: No  diagnosis found.  Plan: Patient will continue weightbearing as tolerated he will closely monitor the ulcer.  He may use some antibacterial ointment and a bandage when he is not in his prosthesis discussed also using the medical compression shrinker underneath his socket with direct skin contact.   Follow-Up Instructions: Return in about 4 weeks (around 07/06/2021), or if symptoms worsen or fail to improve.   Ortho Exam  Patient is alert, oriented, no adenopathy, well-dressed, normal affect, normal respiratory effort.  Residual limb tight with edema this is moderate On examination of the left residual limb this is well-healed from a surgical standpoint however he does have an ulcer along his incision this is reduced in size. Please see attached photo. there is no depth no active drainage no surrounding erythema the wound is filled in with 100% granulation tissue  Imaging: No results found. No images are attached to the encounter.  Labs: Lab Results  Component Value Date   HGBA1C 8.2 (H) 12/31/2020   HGBA1C 7.8 (H) 08/27/2020   HGBA1C >15.5 (H) 06/06/2020   ESRSEDRATE 95 (H) 09/17/2020   ESRSEDRATE >140 (H) 08/26/2020   ESRSEDRATE 105 (H) 06/04/2020   CRP 19.0 (H) 09/17/2020   CRP 18.3 (H) 08/26/2020   CRP 24.4 (H) 06/04/2020   LABURIC 9.3 (H) 06/01/2021   LABURIC 11.2 (H) 08/29/2020  LABURIC 9.1 (H) 06/09/2020   REPTSTATUS 09/22/2020 FINAL 09/17/2020   GRAMSTAIN  06/06/2020    RARE WBC PRESENT, PREDOMINANTLY PMN ABUNDANT GRAM POSITIVE COCCI RARE GRAM POSITIVE RODS    CULT  09/17/2020    NO GROWTH 5 DAYS Performed at [facility], [place] [address], [place], [place] [address]    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     Lab Results  Component Value Date   ALBUMIN 2.3 (L) 09/26/2020   ALBUMIN 2.5 (L) 09/24/2020   ALBUMIN 2.6 (L) 09/17/2020   PREALBUMIN 8.2 (L) 09/17/2020   PREALBUMIN 5.1 (L) 06/04/2020    Lab Results  Component Value Date    MG 1.3 (L) 09/29/2020   MG 1.3 (L) 06/06/2020   MG 1.7 05/03/2019   Lab Results  Component Value Date   VD25OH 22 (L) 06/01/2021    Lab Results  Component Value Date   PREALBUMIN 8.2 (L) 09/17/2020   PREALBUMIN 5.1 (L) 06/04/2020      Latest Ref Rng & Units 06/01/2021   12:00 AM 01/09/2021    9:16 PM 01/09/2021   12:12 PM  CBC EXTENDED  WBC 3.8 - 10.8 Thousand/uL 7.1      RBC 4.20 - 5.80 Million/uL 3.97      Hemoglobin 13.2 - 17.1 g/dL 10.8   9.1   8.0    HCT 38.5 - 50.0 % 33.4   29.3   25.2    Platelets 140 - 400 Thousand/uL 319         There is no height or weight on file to calculate BMI.  Orders:  No orders of the defined types were placed in this encounter.  No orders of the defined types were placed in this encounter.    Procedures: No procedures performed  Clinical Data: No additional findings.  ROS:  All other systems negative, except as noted in the HPI. Review of Systems  Objective: Vital Signs: There were no vitals taken for this visit.  Specialty Comments:  No specialty comments available.  PMFS History: Patient Active Problem List   Diagnosis Date Noted   Left below-knee amputee ([place]) 12/30/2020   Subacute osteomyelitis, left ankle and foot ([place])    Cholelithiasis with acute cholecystitis 09/25/2020   Hypertensive urgency 09/25/2020   Constipation 09/25/2020   Thrombocytosis 09/25/2020   S/P BKA (below knee amputation) unilateral, right ([place]) 09/01/2020   ABLA (acute blood loss anemia) 09/01/2020   Anemia 08/27/2020   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 ([place]) 07/20/2020   Dehiscence of amputation stump ([place]) 07/20/2020   Cutaneous abscess of right foot    Osteomyelitis of right foot ([facility])    Severe protein-calorie malnutrition ([place])    Sepsis due to cellulitis ([place]) 06/04/2020   Diabetic foot ulcer ([place]) 06/04/2020   Diabetic foot infection ([place]) 06/04/2020   CKD (chronic kidney disease) stage 3, GFR 30-59  ml/min ([place]) 06/04/2020   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[place]) 06/04/2020   Acute respiratory disease due to COVID-19 virus 05/03/2019   Renal insufficiency 05/03/2019   GOUT 01/27/2010   OBESITY 01/27/2010   Insulin-requiring or dependent type II diabetes mellitus ([place]) 02/20/2008   HYPERTENSION, BENIGN ESSENTIAL 02/20/2008   Past Medical History:  Diagnosis Date   Anemia    Diabetes mellitus    Gout    History of COVID-19 04/2019   Hypertension    PVD (peripheral vascular disease) ([place])    s/p R BKA    Family  History  Problem Relation Age of Onset   Diabetes Neg Hx     Past Surgical History:  Procedure Laterality Date   AMPUTATION Right 06/06/2020   Procedure: AMPUTATION 5TH RAY;  Surgeon: [name], MD;  Location: [facility];  Service: Orthopedics;  Laterality: Right;   AMPUTATION Right 06/08/2020   Procedure: RIGHT 4TH RAY AMPUTATION;  Surgeon: [name], MD;  Location: [facility];  Service: Orthopedics;  Laterality: Right;   AMPUTATION Right 08/27/2020   Procedure: AMPUTATION BELOW KNEE;  Surgeon: Jessy Oto, MD;  Location: [facility];  Service: Orthopedics;  Laterality: Right;   AMPUTATION Left 12/30/2020   Procedure: LEFT BELOW KNEE AMPUTATION;  Surgeon: [name], MD;  Location: [facility];  Service: Orthopedics;  Laterality: Left;   CHOLECYSTECTOMY N/A 09/25/2020   Procedure: LAPAROSCOPIC CHOLECYSTECTOMY;  Surgeon: Jesusita Oka, MD;  [facility];  Service: General;  Laterality: N/A;   Social History   Occupational History   Occupation: logistics  Tobacco Use   Smoking status: Never   Smokeless tobacco: Never  Vaping Use   Vaping Use: Never used  Substance and Sexual Activity   Alcohol use: No   Drug use: No   Sexual activity: Not Currently

## 2021-06-28 ENCOUNTER — Encounter: Payer: Self-pay | Admitting: Rehabilitation

## 2021-06-28 ENCOUNTER — Ambulatory Visit: Payer: Medicaid Other | Admitting: Rehabilitation

## 2021-06-28 DIAGNOSIS — R2681 Unsteadiness on feet: Secondary | ICD-10-CM

## 2021-06-28 DIAGNOSIS — M6281 Muscle weakness (generalized): Secondary | ICD-10-CM

## 2021-06-28 DIAGNOSIS — R2689 Other abnormalities of gait and mobility: Secondary | ICD-10-CM | POA: Diagnosis present

## 2021-06-28 NOTE — Therapy (Addendum)
? ?OUTPATIENT PHYSICAL THERAPY PROSTHETICS EVALUATION ? ? ?Patient Name: Patrick [name] ?MRN: [id number] ?DOB:[birth year], 56 y.o., male ?Today's Date: 06/28/2021 ? ?PCP: [facility] ?REFERRING PROVIDER: [name], MD  ? ? PT End of Session - 06/28/21 1400   ? ? Visit Number 1   ? Number of Visits 13   eval+12 vists  ? Authorization Type [facility] Medicaid (awaiting approval)   ? PT Start Time 1230   ? PT Stop Time 1315   ? PT Time Calculation (min) 45 min   ? Equipment Utilized During Treatment --   B prostheses  ? Activity Tolerance Patient tolerated treatment well;Patient limited by fatigue   ? Behavior During Therapy [facility] for tasks assessed/performed   ? ?  ?  ? ?  ? ? ?Past Medical History:  ?Diagnosis Date  ? Anemia   ? Diabetes mellitus   ? Gout   ? History of COVID-19 04/2019  ? Hypertension   ? PVD (peripheral vascular disease) ([employer])   ? s/p R BKA  ? ?Past Surgical History:  ?Procedure Laterality Date  ? AMPUTATION Right 06/06/2020  ? Procedure: AMPUTATION 5TH RAY;  Surgeon: [name], MD;  Location: [place];  Service: Orthopedics;  Laterality: Right;  ? AMPUTATION Right 06/08/2020  ? Procedure: RIGHT 4TH RAY AMPUTATION;  Surgeon: [name], MD;  Location: [place];  Service: Orthopedics;  Laterality: Right;  ? AMPUTATION Right 08/27/2020  ? Procedure: AMPUTATION BELOW KNEE;  Surgeon: Jessy Oto, MD;  Location: [place];  Service: Orthopedics;  Laterality: Right;  ? AMPUTATION Left 12/30/2020  ? Procedure: LEFT BELOW KNEE AMPUTATION;  Surgeon: [name], MD;  Location: [place];  Service: Orthopedics;  Laterality: Left;  ? CHOLECYSTECTOMY N/A 09/25/2020  ? Procedure: LAPAROSCOPIC CHOLECYSTECTOMY;  Surgeon: Jesusita Oka, MD;  Location: [place];  Service: General;  Laterality: N/A;  ? ?Patient Active Problem List  ? Diagnosis Date Noted  ? Left below-knee amputee ([place]) 12/30/2020  ? Subacute osteomyelitis, left ankle and foot ([place])   ? Cholelithiasis with acute cholecystitis 09/25/2020  ? Hypertensive  urgency 09/25/2020  ? Constipation 09/25/2020  ? Thrombocytosis 09/25/2020  ? S/P BKA (below knee amputation) unilateral, right ([place]) 09/01/2020  ? ABLA (acute blood loss anemia) 09/01/2020  ? Anemia 08/27/2020  ? 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 ([place]) 07/20/2020  ? Dehiscence of amputation stump ([place]) 07/20/2020  ? Cutaneous abscess of right foot   ? Osteomyelitis of right foot ([place])   ? Severe protein-calorie malnutrition ([place])   ? Sepsis due to cellulitis ([place]) 06/04/2020  ? Diabetic foot ulcer ([place]) 06/04/2020  ? Diabetic foot infection ([place]) 06/04/2020  ? CKD (chronic kidney disease) stage 3, GFR 30-59 ml/min (HCC) 06/04/2020  ? 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[place]) 06/04/2020  ? Acute respiratory disease due to COVID-19 virus 05/03/2019  ? Renal insufficiency 05/03/2019  ? GOUT 01/27/2010  ? OBESITY 01/27/2010  ? Insulin-requiring or dependent type II diabetes mellitus ([place]) 02/20/2008  ? HYPERTENSION, BENIGN ESSENTIAL 02/20/2008  ? ? ?ONSET DATE: 56 y/o male s/p L BKA on 11/11. (Note that R BKA was in July 2022) ?REFERRING DIAG: Z89.511 (ICD-10-CM) - Hx of BKA, right ([place])  ? ?THERAPY DIAG:  ?Unsteadiness on feet - Plan: PT plan of care cert/re-cert ? ?Muscle weakness (generalized) - Plan: PT plan of care cert/re-cert ? ?Other abnormalities of gait and mobility - Plan: PT plan of care cert/re-cert ? ?SUBJECTIVE:  ? ?  SUBJECTIVE STATEMENT: ?Pt is s/p newer L BKA, hx of R BKA as well.  Would like to walk with cane then without any device.  Wants to work to get back to normal functioning and better stamina.  ?Pt accompanied by: self ? ?PERTINENT HISTORY: PMH includes R BKA, HTN, DM, and gout  ? ? ?PAIN:  ?Are you having pain? No (sometimes does have back pain from overdoing it).  ? ? ?PRECAUTIONS: Fall ? ?WEIGHT BEARING RESTRICTIONS No ? ?FALLS: Has patient fallen in last 6 months? No ? ?LIVING ENVIRONMENT: ?Lives with: lives with their family and Is at [facility], but goes home Saturday, is  going to live with some friends ?Lives in: House/apartment ?Home Access: Stairs to enter ?Home layout: Two level ?Stairs: Yes: Internal: 12 steps; on left going up and External: 9 steps; on right going up, on left going up, and bilateral but cannot reach both ?Has following equipment at home: Quad cane small base, Walker - 2 wheeled, and shower chair ? ?PLOF: Independent with basic ADLs and Independent with homemaking with wheelchair ? ?PATIENT GOALS To be as high functioning as possible.  ? ?OBJECTIVE:  ? ?DIAGNOSTIC FINDINGS:  ? ?COGNITION: ?Overall cognitive status: Within functional limits for tasks assessed ?  ?SENSATION: ?WFL ? ?MUSCLE LENGTH: ?Normal hamstring length ? ? ?POSTURE: rounded shoulders and forward head ? ?LE ROM: ? ?ROM Right ?06/28/2021 Left ?06/28/2021  ?Hip flexion [facility] WFL  ?Hip extension    ?Hip abduction    ?Hip adduction    ?Hip internal rotation    ?Hip external rotation    ?Knee flexion    ?Knee extension    ?Ankle dorsiflexion    ?Ankle plantarflexion    ?Ankle inversion    ?Ankle eversion    ? (Blank rows = not tested)WFL throughout ?MMT: ? ?MMT Right ?06/28/2021 Left ?06/28/2021  ?Hip flexion 4/5 4/5  ?Hip extension    ?Hip abduction 5/5 5/5  ?Hip adduction 5/5 5/5  ?Hip internal rotation    ?Hip external rotation    ?Knee flexion 4/5 4/5  ?Knee extension 5/5 5/5  ?Ankle dorsiflexion    ?Ankle plantarflexion    ?Ankle inversion    ?Ankle eversion    ?(Blank rows = not tested)Tested in seated position  ? ? ?TRANSFERS: ?Sit to stand: SBA and CGA ?Stand to sit: SBA ?Stand pivot transfer: CGA without device  ? ?RAMP  n/a ? ?CURB:  n/a ? ?GAIT: ?Gait pattern: step through pattern, decreased arm swing- Right, decreased arm swing- Left, decreased stride length, lateral hip instability, and wide BOS ?Distance walked: 100' x 2 reps (with and without AD) ?Assistive device utilized: Environmental consultant - 2 wheeled and None ?Level of assistance: SBA and Min A ?Gait velocity: 2.13 ft/sec with RW, 1.12 ft/sec  without AD and up to min A ?Comments: Pt ambulates with wide BOS with and without walker, but does improve with cuing.  Also provided cues for upright posture throughout, with intermittent looking down as needed.  Trialed gait without AD and BOS much wider, gait speed much slower and much more unsteady with limited balance strategies.  ? ?FUNCTIONAL TESTs:  ?10 meter walk test: see above ?Berg Balance Scale: did not get to complete ? ? [facility] PT Assessment - 06/28/21 1333   ? ?  ? Standardized Balance Assessment  ? Standardized Balance Assessment Berg Balance Test   ?  ? Berg Balance Test  ? Sit to Stand Able to stand  independently using hands   ?  Standing Unsupported Able to stand 2 minutes with supervision   ? Sitting with Back Unsupported but Feet Supported on Floor or Stool Able to sit safely and securely 2 minutes   ? Stand to Sit Controls descent by using hands   ? Transfers Able to transfer safely, definite need of hands   ? Standing Unsupported with Feet Together Needs help to attain position but able to stand for 30 seconds with feet together   not able to completely place legs together due to sockets  ? ?  ?  ? ?  ? ? ? ?CARDIOVASCULAR RESPONSE: ?Functional activity: not assessed but moderately fatigued following short distance gait.  ?Pre-activity vitals: ?BP:  ?HR:  ?SpO2:  ?Post-activity vitals: ?BP:  ?HR:  ?SpO2:  ?Modified Borg scale for dyspnea:  ? ?CURRENT PROSTHETIC WEAR ASSESSMENT: ?Patient is independent with:  needs education on all aspects, except donning/doffing prostheses ?Patient is dependent with: skin check, ply sock cleaning, correct ply sock adjustment, proper wear schedule/adjustment, and proper weight-bearing schedule/adjustment ?Donning prosthesis: SBA ?Doffing prosthesis: Modified independence ?Prosthetic wear tolerance: 16 hours/day, 7 days/week ?Prosthetic weight bearing tolerance: 5-10 mins minutes ?Edema: mild swelling in L residual limb, none in R ?Residual limb condition: On  left residual limb, has two small open areas, one at about midline incision that is closed, non draining (approx 1cmx2cm) and another open area that is granulation tissue with mild bleeding (approx 1cmx1cm).  H

## 2021-07-03 ENCOUNTER — Encounter: Payer: Medicaid Other | Admitting: Physical Therapy

## 2021-07-03 NOTE — Therapy (Deleted)
OUTPATIENT PHYSICAL THERAPY PROSTHETICS TREATMENT  Patient Name: Patrick [name] MRN: [id number] DOB:1965/11/19, 56 y.o., male Today's Date: 07/03/2021  [facility]: [facility] REFERRING PROVIDER: [name], MD      Past Medical History:  Diagnosis Date   Anemia    Diabetes mellitus    Gout    History of COVID-19 04/2019   Hypertension    PVD (peripheral vascular disease) ([place])    s/p R BKA   Past Surgical History:  Procedure Laterality Date   AMPUTATION Right 06/06/2020   Procedure: AMPUTATION 5TH RAY;  Surgeon: [name], MD;  Location: [place];  Service: Orthopedics;  Laterality: Right;   AMPUTATION Right 06/08/2020   Procedure: RIGHT 4TH RAY AMPUTATION;  Surgeon: [name], MD;  Location: [place];  Service: Orthopedics;  Laterality: Right;   AMPUTATION Right 08/27/2020   Procedure: AMPUTATION BELOW KNEE;  Surgeon: Jessy Oto, MD;  Location: [place];  Service: Orthopedics;  Laterality: Right;   AMPUTATION Left 12/30/2020   Procedure: LEFT BELOW KNEE AMPUTATION;  Surgeon: [name], MD;  Location: [place];  Service: Orthopedics;  Laterality: Left;   CHOLECYSTECTOMY N/A 09/25/2020   Procedure: LAPAROSCOPIC CHOLECYSTECTOMY;  Surgeon: Jesusita Oka, MD;  Location: [place];  Service: General;  Laterality: N/A;   Patient Active Problem List   Diagnosis Date Noted   Left below-knee amputee ([place]) 12/30/2020   Subacute osteomyelitis, left ankle and foot ([place])    Cholelithiasis with acute cholecystitis 09/25/2020   Hypertensive urgency 09/25/2020   Constipation 09/25/2020   Thrombocytosis 09/25/2020   S/P BKA (below knee amputation) unilateral, right ([place]) 09/01/2020   ABLA (acute blood loss anemia) 09/01/2020   Anemia 08/27/2020   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 ([place]) 07/20/2020   Dehiscence of amputation stump ([place]) 07/20/2020   Cutaneous abscess of right foot    Osteomyelitis of right foot ([facility])    Severe protein-calorie  malnutrition ([place])    Sepsis due to cellulitis ([place]) 06/04/2020   Diabetic foot ulcer ([place]) 06/04/2020   Diabetic foot infection ([place]) 06/04/2020   CKD (chronic kidney disease) stage 3, GFR 30-59 ml/min ([place]) 06/04/2020   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[place]) 06/04/2020   Acute respiratory disease due to COVID-19 virus 05/03/2019   Renal insufficiency 05/03/2019   GOUT 01/27/2010   OBESITY 01/27/2010   Insulin-requiring or dependent type II diabetes mellitus ([place]) 02/20/2008   HYPERTENSION, BENIGN ESSENTIAL 02/20/2008    ONSET DATE: 56 y/o male s/p L BKA on 11/11. (Note that R BKA was in July 2022) REFERRING DIAG: Z89.511 (ICD-10-CM) - Hx of BKA, right ([place])   THERAPY DIAG:  Unsteadiness on feet - Plan: PT plan of care cert/re-cert   Muscle weakness (generalized) - Plan: PT plan of care cert/re-cert   Other abnormalities of gait and mobility - Plan: PT plan of care cert/re-cert  SUBJECTIVE:   SUBJECTIVE STATEMENT:   PERTINENT HISTORY: PMH includes R BKA, HTN, DM, and gout    PAIN:  Are you having pain? No (sometimes does have back pain from overdoing it).    PRECAUTIONS: Fall  WEIGHT BEARING RESTRICTIONS No   OBJECTIVE:    TODAY'S TREATMENT:   TREATMENT:  PATIENT EDUCATED ON FOLLOWING PROSTHETIC CARE: Prosthetic wear tolerance: Continue as he is, ensuring he is checking skin when legs come off, esp as its warmer outside and sweating increases, educated that he would need to remove multiple times per day and pat dry.  Prosthetic weight bearing tolerance: 10-15 minutes Other education  Comments Educated on applying antiperspirant at night following shower/bath and can don again in am but to let dry prior to donning liner.  Education to avoid areas of wounds.  Educated on how to wash/air dry socks, alternating liners, washing liner with woolite by hand if it gets soiled.       PATIENT EDUCATION: Education details: See above Person educated: Patient Education method:  Explanation and Verbal cues Education comprehension: verbalized understanding   HOME EXERCISE PROGRAM: None given today   ASSESSMENT:  CLINICAL IMPRESSION:   OBJECTIVE IMPAIRMENTS Abnormal gait, cardiopulmonary status limiting activity, decreased activity tolerance, decreased balance, decreased endurance, decreased knowledge of use of DME, decreased mobility, difficulty walking, increased edema, impaired perceived functional ability, improper body mechanics, and prosthetic dependency .   ACTIVITY LIMITATIONS community activity, occupation, yard work, and shopping.   PERSONAL FACTORS 3+ comorbidities: see above  are also affecting patient's functional outcome.    REHAB POTENTIAL: Excellent  CLINICAL DECISION MAKING: Evolving/moderate complexity  EVALUATION COMPLEXITY: Moderate   GOALS: Goals reviewed with patient? Yes  SHORT TERM GOALS: Target date: 07/19/2021  (Remove Blue Hyperlink)  Pt will be IND with initial HEP in order to indicate improved functional mobility and dec fall risk. Baseline:dependent  Goal status: INITIAL  2.  Pt will improve gait speed to >/=2.62 ft/sec w/ RW in order to indicate dec fall risk.  Baseline: 2.13 ft/sec  Goal status: INITIAL  3.  Pt will ambulate 300' nonstop with RW/B prostheses at S level in order to indicate more independent household ambulation and improved endurance.   Baseline: 100' with RW/prostheses with marked fatigue.  Goal status: INITIAL  4.  Pt will negotiate up/down 4 steps with single rail, up/down ramp and curb with RW and ambulate x 300' outdoors over unlevel paved surfaces at S level in order to indicate improved community mobility.   Baseline: 4 steps with B rails, unable to assess outdoor gait at this time Goal status: INITIAL  5.  Pt will demonstrate good understanding of sock ply adjustment and management of skin issues in order to continue to heel and progress in mobility.  Baseline: Needs cuing at this  time Goal status: INITIAL  6.  Pt will ambulate x 39' with SBQC (or lesser device if able) at S level in order to indicate improved household mobility.  Baseline: Needs device at this time Goal status: INITIAL  7.  Will finish BERG balance test and update goal to reflect dec fall risk.  Baseline: did not get to finish on eval  Goal status: INITIAL  LONG TERM GOALS: Target date: 08/09/2021  (Remove Blue Hyperlink)  Pt will be IND with final HEP in order to indicate improved functional mobility and dec fall risk. Baseline: dependent  Goal status: INITIAL  2.  Pt will improve gait speed to >/=2.0 ft/sec w/ SBQC (or lesser device) in order to indicate dec fall risk.  Baseline: 2.13 ft/sec with RW, 1.12 ft/sec without device and min A Goal status: INITIAL  3.  Pt will negotiate up/down 4 steps with single rail/cane, up/down ramp and curb and ambulate x 500' outdoors over unlevel paved surfaces with LRAD at S level in order to indicate improved community mobility.   Baseline: 4 steps with B rails, not able to assess outdoor gait Goal status: INITIAL  4.  Pt will be able to negotiate up/down grassy hills, perform brief SLS to simulate stepping into/out of boat in order  to return to leisure activity.  Baseline: not assessed at this time Goal status: INITIAL  5.  Pt will ambulate 300' without AD at S level in order to indicate more independent household ambulation.   Baseline: needs min A to ambulate without AD Goal status: INITIAL  6.  Will update LTG for BERG once assessed.  Baseline: did not finish  Goal status: INITIAL   PLAN: PT FREQUENCY: 2x/week  PT DURATION: 6 weeks  PLANNED INTERVENTIONS: Therapeutic exercises, Therapeutic activity, Neuromuscular re-education, Balance training, Gait training, Patient/Family education, Stair training, Vestibular training, Prosthetic training, and DME instructions  PLAN FOR NEXT SESSION: Finish BERG and update goals, initiate HEP (sink  with decreasing UE support, counter top tasks such as marching, backwards walking, etc)   Bjorn Loser, PTA  07/03/21, 10:04 AM   [employer] Authorization   Choose one: Neuro Rehabilitative  Standardized Assessment or Functional Outcome Tool: See Pain Assessment and Gait Velocity   Score or Percent Disability: Gait speed indicates fall risk (1.12 ft/sec without device)  Body Parts Treated (Select each separately):  Other BLE . Overall deficits/functional limitations for body part selected: moderate

## 2021-07-05 ENCOUNTER — Ambulatory Visit: Payer: Medicaid Other | Admitting: Physical Therapy

## 2021-07-10 ENCOUNTER — Telehealth: Payer: Self-pay | Admitting: Physical Therapy

## 2021-07-10 ENCOUNTER — Ambulatory Visit: Payer: Medicaid Other | Admitting: Physical Therapy

## 2021-07-10 NOTE — Telephone Encounter (Signed)
Left message on pt's voicemail due to pt no showing for PT treatment today. Bjorn Loser, PTA  07/10/21, 10:31 AM

## 2021-07-10 NOTE — Therapy (Deleted)
OUTPATIENT PHYSICAL THERAPY PROSTHETICS TREATMENT  Patient Name: Patrick [name] MRN: [id number] DOB:1966/01/27, 56 y.o., male Today's Date: 07/10/2021  PCP: [facility] REFERRING PROVIDER: [name], MD      Past Medical History:  Diagnosis Date   Anemia    Diabetes mellitus    Gout    History of COVID-19 04/2019   Hypertension    PVD (peripheral vascular disease) ([place])    s/p R BKA   Past Surgical History:  Procedure Laterality Date   AMPUTATION Right 06/06/2020   Procedure: AMPUTATION 5TH RAY;  Surgeon: [name], MD;  Location: [facility];  Service: Orthopedics;  Laterality: Right;   AMPUTATION Right 06/08/2020   Procedure: RIGHT 4TH RAY AMPUTATION;  Surgeon: [name], MD;  Location: [facility];  Service: Orthopedics;  Laterality: Right;   AMPUTATION Right 08/27/2020   Procedure: AMPUTATION BELOW KNEE;  Surgeon: Jessy Oto, MD;  Location: [facility];  Service: Orthopedics;  Laterality: Right;   AMPUTATION Left 12/30/2020   Procedure: LEFT BELOW KNEE AMPUTATION;  Surgeon: [name], MD;  Location: [facility];  Service: Orthopedics;  Laterality: Left;   CHOLECYSTECTOMY N/A 09/25/2020   Procedure: LAPAROSCOPIC CHOLECYSTECTOMY;  Surgeon: Jesusita Oka, MD;  Location: [facility];  Service: General;  Laterality: N/A;   Patient Active Problem List   Diagnosis Date Noted   Left below-knee amputee ([place]) 12/30/2020   Subacute osteomyelitis, left ankle and foot ([place])    Cholelithiasis with acute cholecystitis 09/25/2020   Hypertensive urgency 09/25/2020   Constipation 09/25/2020   Thrombocytosis 09/25/2020   S/P BKA (below knee amputation) unilateral, right ([place]) 09/01/2020   ABLA (acute blood loss anemia) 09/01/2020   Anemia 08/27/2020   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 ([place]) 07/20/2020   Dehiscence of amputation stump ([place]) 07/20/2020   Cutaneous abscess of right foot    Osteomyelitis of right foot (H[facility])    Severe protein-calorie  malnutrition ([place])    Sepsis due to cellulitis ([place]) 06/04/2020   Diabetic foot ulcer ([place]) 06/04/2020   Diabetic foot infection ([place]) 06/04/2020   CKD (chronic kidney disease) stage 3, GFR 30-59 ml/min ([place]) 06/04/2020   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[place]) 06/04/2020   Acute respiratory disease due to COVID-19 virus 05/03/2019   Renal insufficiency 05/03/2019   GOUT 01/27/2010   OBESITY 01/27/2010   Insulin-requiring or dependent type II diabetes mellitus ([place]) 02/20/2008   HYPERTENSION, BENIGN ESSENTIAL 02/20/2008    ONSET DATE: 56 y/o male s/p L BKA on 11/11. (Note that R BKA was in July 2022) REFERRING DIAG: Z89.511 (ICD-10-CM) - Hx of BKA, right ([place])   THERAPY DIAG:  Unsteadiness on feet - Plan: PT plan of care cert/re-cert   Muscle weakness (generalized) - Plan: PT plan of care cert/re-cert   Other abnormalities of gait and mobility - Plan: PT plan of care cert/re-cert  SUBJECTIVE:   SUBJECTIVE STATEMENT:   PERTINENT HISTORY: PMH includes R BKA, HTN, DM, and gout    PAIN:  Are you having pain? No (sometimes does have back pain from overdoing it).    PRECAUTIONS: Fall  WEIGHT BEARING RESTRICTIONS No   OBJECTIVE:    TODAY'S TREATMENT:   TREATMENT:  PATIENT EDUCATED ON FOLLOWING PROSTHETIC CARE: Prosthetic wear tolerance: Continue as he is, ensuring he is checking skin when legs come off, esp as its warmer outside and sweating increases, educated that he would need to remove multiple times per day and pat dry.  Prosthetic weight bearing tolerance: 10-15 minutes Other education  Comments Educated on applying antiperspirant at night following shower/bath and can don again in am but to let dry prior to donning liner.  Education to avoid areas of wounds.  Educated on how to wash/air dry socks, alternating liners, washing liner with woolite by hand if it gets soiled.       PATIENT EDUCATION: Education details: See above Person educated: Patient Education method:  Explanation and Verbal cues Education comprehension: verbalized understanding   HOME EXERCISE PROGRAM: None given today   ASSESSMENT:  CLINICAL IMPRESSION:   OBJECTIVE IMPAIRMENTS Abnormal gait, cardiopulmonary status limiting activity, decreased activity tolerance, decreased balance, decreased endurance, decreased knowledge of use of DME, decreased mobility, difficulty walking, increased edema, impaired perceived functional ability, improper body mechanics, and prosthetic dependency .   ACTIVITY LIMITATIONS community activity, occupation, yard work, and shopping.   PERSONAL FACTORS 3+ comorbidities: see above  are also affecting patient's functional outcome.    REHAB POTENTIAL: Excellent  CLINICAL DECISION MAKING: Evolving/moderate complexity  EVALUATION COMPLEXITY: Moderate   GOALS: Goals reviewed with patient? Yes  SHORT TERM GOALS: Target date: 07/19/2021  (Remove Blue Hyperlink)  Pt will be IND with initial HEP in order to indicate improved functional mobility and dec fall risk. Baseline:dependent  Goal status: INITIAL  2.  Pt will improve gait speed to >/=2.62 ft/sec w/ RW in order to indicate dec fall risk.  Baseline: 2.13 ft/sec  Goal status: INITIAL  3.  Pt will ambulate 300' nonstop with RW/B prostheses at S level in order to indicate more independent household ambulation and improved endurance.   Baseline: 100' with RW/prostheses with marked fatigue.  Goal status: INITIAL  4.  Pt will negotiate up/down 4 steps with single rail, up/down ramp and curb with RW and ambulate x 300' outdoors over unlevel paved surfaces at S level in order to indicate improved community mobility.   Baseline: 4 steps with B rails, unable to assess outdoor gait at this time Goal status: INITIAL  5.  Pt will demonstrate good understanding of sock ply adjustment and management of skin issues in order to continue to heel and progress in mobility.  Baseline: Needs cuing at this  time Goal status: INITIAL  6.  Pt will ambulate x 64' with SBQC (or lesser device if able) at S level in order to indicate improved household mobility.  Baseline: Needs device at this time Goal status: INITIAL  7.  Will finish BERG balance test and update goal to reflect dec fall risk.  Baseline: did not get to finish on eval  Goal status: INITIAL  LONG TERM GOALS: Target date: 08/09/2021  (Remove Blue Hyperlink)  Pt will be IND with final HEP in order to indicate improved functional mobility and dec fall risk. Baseline: dependent  Goal status: INITIAL  2.  Pt will improve gait speed to >/=2.0 ft/sec w/ SBQC (or lesser device) in order to indicate dec fall risk.  Baseline: 2.13 ft/sec with RW, 1.12 ft/sec without device and min A Goal status: INITIAL  3.  Pt will negotiate up/down 4 steps with single rail/cane, up/down ramp and curb and ambulate x 500' outdoors over unlevel paved surfaces with LRAD at S level in order to indicate improved community mobility.   Baseline: 4 steps with B rails, not able to assess outdoor gait Goal status: INITIAL  4.  Pt will be able to negotiate up/down grassy hills, perform brief SLS to simulate stepping into/out of boat in order  to return to leisure activity.  Baseline: not assessed at this time Goal status: INITIAL  5.  Pt will ambulate 300' without AD at S level in order to indicate more independent household ambulation.   Baseline: needs min A to ambulate without AD Goal status: INITIAL  6.  Will update LTG for BERG once assessed.  Baseline: did not finish  Goal status: INITIAL   PLAN: PT FREQUENCY: 2x/week  PT DURATION: 6 weeks  PLANNED INTERVENTIONS: Therapeutic exercises, Therapeutic activity, Neuromuscular re-education, Balance training, Gait training, Patient/Family education, Stair training, Vestibular training, Prosthetic training, and DME instructions  PLAN FOR NEXT SESSION: Finish BERG and update goals, initiate HEP (sink  with decreasing UE support, counter top tasks such as marching, backwards walking, etc)   Bjorn Loser, PTA  07/10/21, 9:26 AM   [place] one: Neuro Rehabilitative  Standardized Assessment or Functional Outcome Tool: See Pain Assessment and Gait Velocity   Score or Percent Disability: Gait speed indicates fall risk (1.12 ft/sec without device)  Body Parts Treated (Select each separately):  Other BLE . Overall deficits/functional limitations for body part selected: moderate

## 2021-07-12 ENCOUNTER — Ambulatory Visit: Payer: Medicaid Other | Admitting: Physical Therapy

## 2021-07-12 ENCOUNTER — Telehealth: Payer: Self-pay | Admitting: Physical Therapy

## 2021-07-12 NOTE — Telephone Encounter (Signed)
Called pt about missed PT appointment today and that it was his 3rd in a row. Informed pt via message all appts have been cancelled per our policy and he will need to call to reschedule. Left office number as well.   Willow Ora, PTA, [facility] [address], [place], [place] [address] [phone] 07/12/21, 11:41 AM

## 2021-07-18 ENCOUNTER — Ambulatory Visit: Payer: Medicaid Other | Admitting: Rehabilitation

## 2021-07-20 ENCOUNTER — Encounter: Payer: Medicaid Other | Admitting: Physical Therapy

## 2021-07-25 ENCOUNTER — Encounter: Payer: Medicaid Other | Admitting: Rehabilitation

## 2021-07-27 ENCOUNTER — Ambulatory Visit (INDEPENDENT_AMBULATORY_CARE_PROVIDER_SITE_OTHER): Payer: Medicaid Other | Admitting: Orthopedic Surgery

## 2021-07-27 DIAGNOSIS — Z89511 Acquired absence of right leg below knee: Secondary | ICD-10-CM | POA: Diagnosis not present

## 2021-07-27 DIAGNOSIS — Z89512 Acquired absence of left leg below knee: Secondary | ICD-10-CM | POA: Diagnosis not present

## 2021-07-28 ENCOUNTER — Encounter: Payer: Medicaid Other | Admitting: Physical Therapy

## 2021-08-01 ENCOUNTER — Encounter: Payer: Medicaid Other | Admitting: Rehabilitation

## 2021-08-04 ENCOUNTER — Encounter: Payer: Medicaid Other | Admitting: Physical Therapy

## 2021-08-05 ENCOUNTER — Encounter: Payer: Self-pay | Admitting: Orthopedic Surgery

## 2021-08-05 NOTE — Progress Notes (Signed)
Office Visit Note   Patient: Patrick [name]           Date of Birth: Jul 26, 1965           MRN: [id number] Visit Date: 07/27/2021              Requested by: [facility] [address] [place],  [place] [address] PCP: [facility]  Chief Complaint  Patient presents with   Left Leg - Follow-up    12/30/20 left BKA  Hx right BKA with Dr. Louanne Skye 08/27/20      HPI: Patient is a 56 year old gentleman who is status post bilateral transtibial amputations.  Left in November 2022 in the right in July 2022.  He is wearing prosthetics today patient is being fit for a tactile pump for his lymphedema.  Patient has had decreased volume in the residual limb and is having difficulty wearing his prosthesis he has been wearing multiple ply socks and padding of the socket without relief.  Assessment & Plan: Visit Diagnoses:  1. Left below-knee amputee ([place])   2. Hx of BKA, right ([place])     Plan: Patient was given a prescription for [employer] for a new Programmer, multimedia and supplies.  Patient has been going to neuro rehab for his physical therapy.  Follow-Up Instructions: Return if symptoms worsen or fail to improve.   Ortho Exam  Patient is alert, oriented, no adenopathy, well-dressed, normal affect, normal respiratory effort. Examination there is no ulcers no cellulitis no skin breakdown.  Patient has significant decreased volume and will need new socket as these have been modified as much as possible.  Patient is an existing bilateral transtibial  amputee.  Patient's current comorbidities are not expected to impact the ability to function with the prescribed prosthesis. Patient verbally communicates a strong desire to use a prosthesis. Patient currently requires mobility aids to ambulate without a prosthesis.  Expects not to use mobility aids with a new prosthesis.  Patient is a K3 level ambulator that spends a lot of time walking around on uneven terrain over  obstacles, up and down stairs, and ambulates with a variable cadence.     Imaging: No results found. No images are attached to the encounter.  Labs: Lab Results  Component Value Date   HGBA1C 8.2 (H) 12/31/2020   HGBA1C 7.8 (H) 08/27/2020   HGBA1C >15.5 (H) 06/06/2020   ESRSEDRATE 95 (H) 09/17/2020   ESRSEDRATE >140 (H) 08/26/2020   ESRSEDRATE 105 (H) 06/04/2020   CRP 19.0 (H) 09/17/2020   CRP 18.3 (H) 08/26/2020   CRP 24.4 (H) 06/04/2020   LABURIC 9.3 (H) 06/01/2021   LABURIC 11.2 (H) 08/29/2020   LABURIC 9.1 (H) 06/09/2020   REPTSTATUS 09/22/2020 FINAL 09/17/2020   GRAMSTAIN  06/06/2020    RARE WBC PRESENT, PREDOMINANTLY PMN ABUNDANT GRAM POSITIVE COCCI RARE GRAM POSITIVE RODS    CULT  09/17/2020    NO GROWTH 5 DAYS Performed at [facility] [address], [place], [place] [address]    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     Lab Results  Component Value Date   ALBUMIN 2.3 (L) 09/26/2020   ALBUMIN 2.5 (L) 09/24/2020   ALBUMIN 2.6 (L) 09/17/2020   PREALBUMIN 8.2 (L) 09/17/2020   PREALBUMIN 5.1 (L) 06/04/2020    Lab Results  Component Value Date   MG 1.3 (L) 09/29/2020   MG 1.3 (L) 06/06/2020   MG 1.7 05/03/2019   Lab Results  Component Value Date   VD25OH 22 (L) 06/01/2021    Lab Results  Component Value Date   PREALBUMIN 8.2 (L) 09/17/2020   PREALBUMIN 5.1 (L) 06/04/2020      Latest Ref Rng & Units 06/01/2021   12:00 AM 01/09/2021    9:16 PM 01/09/2021   12:12 PM  CBC EXTENDED  WBC 3.8 - 10.8 Thousand/uL 7.1     RBC 4.20 - 5.80 Million/uL 3.97     Hemoglobin 13.2 - 17.1 g/dL 10.8  9.1  8.0   HCT 38.5 - 50.0 % 33.4  29.3  25.2   Platelets 140 - 400 Thousand/uL 319        There is no height or weight on file to calculate BMI.  Orders:  No orders of the defined types were placed in this encounter.  No orders of the defined types were placed in this encounter.    Procedures: No procedures  performed  Clinical Data: No additional findings.  ROS:  All other systems negative, except as noted in the HPI. Review of Systems  Objective: Vital Signs: There were no vitals taken for this visit.  Specialty Comments:  No specialty comments available.  PMFS History: Patient Active Problem List   Diagnosis Date Noted   Left below-knee amputee ([place]) 12/30/2020   Subacute osteomyelitis, left ankle and foot ([place])    Cholelithiasis with acute cholecystitis 09/25/2020   Hypertensive urgency 09/25/2020   Constipation 09/25/2020   Thrombocytosis 09/25/2020   S/P BKA (below knee amputation) unilateral, right ([place]) 09/01/2020   ABLA (acute blood loss anemia) 09/01/2020   Anemia 08/27/2020   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 ([place]) 07/20/2020   Dehiscence of amputation stump ([place]) 07/20/2020   Cutaneous abscess of right foot    Osteomyelitis of right foot ([facility])    Severe protein-calorie malnutrition ([place])    Sepsis due to cellulitis ([place]) 06/04/2020   Diabetic foot ulcer ([place]) 06/04/2020   Diabetic foot infection ([place]) 06/04/2020   CKD (chronic kidney disease) stage 3, GFR 30-59 ml/min ([place]) 06/04/2020   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[place]) 06/04/2020   Acute respiratory disease due to COVID-19 virus 05/03/2019   Renal insufficiency 05/03/2019   GOUT 01/27/2010   OBESITY 01/27/2010   Insulin-requiring or dependent type II diabetes mellitus ([place]) 02/20/2008   HYPERTENSION, BENIGN ESSENTIAL 02/20/2008   Past Medical History:  Diagnosis Date   Anemia    Diabetes mellitus    Gout    History of COVID-19 04/2019   Hypertension    PVD (peripheral vascular disease) ([place])    s/p R BKA    Family History  Problem Relation Age of Onset   Diabetes Neg Hx     Past Surgical History:  Procedure Laterality Date   AMPUTATION Right 06/06/2020   Procedure: AMPUTATION 5TH RAY;  Surgeon: [name], MD;  Location: [facility];  Service: Orthopedics;  Laterality: Right;    AMPUTATION Right 06/08/2020   Procedure: RIGHT 4TH RAY AMPUTATION;  Surgeon: [name], MD;  Location: [facility];  Service: Orthopedics;  Laterality: Right;   AMPUTATION Right 08/27/2020   Procedure: AMPUTATION BELOW KNEE;  Surgeon: Jessy Oto, MD;  Location: [facility];  Service: Orthopedics;  Laterality: Right;   AMPUTATION Left 12/30/2020   Procedure: LEFT BELOW KNEE AMPUTATION;  Surgeon: [name], MD;  Location: [facility];  Service: Orthopedics;  Laterality: Left;   CHOLECYSTECTOMY N/A 09/25/2020   Procedure: LAPAROSCOPIC CHOLECYSTECTOMY;  Surgeon: Bobbye Morton,  Montel Culver, MD;  Location: [facility];  Service: General;  Laterality: N/A;   Social History   Occupational History   Occupation: logistics  Tobacco Use   Smoking status: Never   Smokeless tobacco: Never  Vaping Use   Vaping Use: Never used  Substance and Sexual Activity   Alcohol use: No   Drug use: No   Sexual activity: Not Currently

## 2021-08-08 ENCOUNTER — Encounter: Payer: Medicaid Other | Admitting: Rehabilitation

## 2021-08-10 ENCOUNTER — Encounter: Payer: Medicaid Other | Admitting: Physical Therapy

## 2021-10-10 ENCOUNTER — Ambulatory Visit (INDEPENDENT_AMBULATORY_CARE_PROVIDER_SITE_OTHER): Payer: Medicaid Other | Admitting: Family

## 2021-10-10 ENCOUNTER — Encounter: Payer: Self-pay | Admitting: Family

## 2021-10-10 DIAGNOSIS — I890 Lymphedema, not elsewhere classified: Secondary | ICD-10-CM

## 2021-10-10 DIAGNOSIS — Z89512 Acquired absence of left leg below knee: Secondary | ICD-10-CM | POA: Diagnosis not present

## 2021-10-10 MED ORDER — OXYCODONE-ACETAMINOPHEN 5-325 MG PO TABS: 1.0000 | ORAL_TABLET | Freq: Four times a day (QID) | ORAL | 0 refills | Status: DC | PRN

## 2021-10-10 MED ORDER — SULFAMETHOXAZOLE-TRIMETHOPRIM 800-160 MG PO TABS: 1.0000 | ORAL_TABLET | Freq: Two times a day (BID) | ORAL | 0 refills | Status: DC

## 2021-10-10 NOTE — Progress Notes (Signed)
Office Visit Note   Patient: Patrick [name]           Date of Birth: [birth year]           MRN: [id number] Visit Date: 10/10/2021              [facility]ed by: [facility] [address] [place],  [place] [address] PCP: [facility]  Chief Complaint  Patient presents with   Left Leg - Pain    12/30/20 left BKA  Hx right BKA with Dr. Louanne Skye 08/27/20      HPI: The patient is a 56 year old gentleman who presents today concerned for worsening swelling warmth and ulcers to his left residual limb he is status post bilateral below-knee amputation has prosthesis set up.  Does also have issues with lymphedema.  Denies fevers or chills  Assessment & Plan: Visit Diagnoses:  1. Left below-knee amputee ([place])   2. Lymphedema     Plan: We will place on a course of Bactrim.  He will minimize his weightbearing on the left and his prosthesis recommended a liner liner.  Dr. Sharol Given has provided him with 1 today.  He will keep the residual limb clean and dry  Follow-Up Instructions: No follow-ups on file.   Ortho Exam  Patient is alert, oriented, no adenopathy, well-dressed, normal affect, normal respiratory effort. On examination of the left residual limb there is worsening edema with pitting erythema and warmth up to the tibial tubercle.  There are 2 ulcerations measuring 1 cm in diameter filled in with bleeding granulation there is also maceration and peeling blistering skin to the distal end of his stump.  No palpable abscess.  Imaging: No results found.   Labs: Lab Results  Component Value Date   HGBA1C 8.2 (H) 12/31/2020   HGBA1C 7.8 (H) 08/27/2020   HGBA1C >15.5 (H) 06/06/2020   ESRSEDRATE 95 (H) 09/17/2020   ESRSEDRATE >140 (H) 08/26/2020   ESRSEDRATE 105 (H) 06/04/2020   CRP 19.0 (H) 09/17/2020   CRP 18.3 (H) 08/26/2020   CRP 24.4 (H) 06/04/2020   LABURIC 9.3 (H) 06/01/2021   LABURIC 11.2 (H) 08/29/2020   LABURIC 9.1 (H) 06/09/2020   REPTSTATUS 09/22/2020 FINAL  09/17/2020   GRAMSTAIN  06/06/2020    RARE WBC PRESENT, PREDOMINANTLY PMN ABUNDANT GRAM POSITIVE COCCI RARE GRAM POSITIVE RODS    CULT  09/17/2020    NO GROWTH 5 DAYS Performed at [facility] [address], [place], [place] [address]    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     Lab Results  Component Value Date   ALBUMIN 2.3 (L) 09/26/2020   ALBUMIN 2.5 (L) 09/24/2020   ALBUMIN 2.6 (L) 09/17/2020   PREALBUMIN 8.2 (L) 09/17/2020   PREALBUMIN 5.1 (L) 06/04/2020    Lab Results  Component Value Date   MG 1.3 (L) 09/29/2020   MG 1.3 (L) 06/06/2020   MG 1.7 05/03/2019   Lab Results  Component Value Date   VD25OH 22 (L) 06/01/2021    Lab Results  Component Value Date   PREALBUMIN 8.2 (L) 09/17/2020   PREALBUMIN 5.1 (L) 06/04/2020      Latest Ref Rng & Units 06/01/2021   12:00 AM 01/09/2021    9:16 PM 01/09/2021   12:12 PM  CBC EXTENDED  WBC 3.8 - 10.8 Thousand/uL 7.1     RBC 4.20 - 5.80 Million/uL 3.97     Hemoglobin 13.2 - 17.1 g/dL 10.8  9.1  8.0  HCT 38.5 - 50.0 % 33.4  29.3  25.2   Platelets 140 - 400 Thousand/uL 319        There is no height or weight on file to calculate BMI.  Orders:  No orders of the defined types were placed in this encounter.  No orders of the defined types were placed in this encounter.    Procedures: No procedures performed  Clinical Data: No additional findings.  ROS:  All other systems negative, except as noted in the HPI. Review of Systems  Objective: Vital Signs: There were no vitals taken for this visit.  Specialty Comments:  No specialty comments available.  PMFS History: Patient Active Problem List   Diagnosis Date Noted   Left below-knee amputee ([place]) 12/30/2020   Subacute osteomyelitis, left ankle and foot ([place])    Cholelithiasis with acute cholecystitis 09/25/2020   Hypertensive urgency 09/25/2020   Constipation 09/25/2020   Thrombocytosis 09/25/2020   S/P BKA (below knee  amputation) unilateral, right ([place]) 09/01/2020   ABLA (acute blood loss anemia) 09/01/2020   Anemia 08/27/2020   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 ([place]) 07/20/2020   Dehiscence of amputation stump ([place]) 07/20/2020   Cutaneous abscess of right foot    Osteomyelitis of right foot (H[facility])    Severe protein-calorie malnutrition ([place])    Sepsis due to cellulitis ([place]) 06/04/2020   Diabetic foot ulcer ([place]) 06/04/2020   Diabetic foot infection ([place]) 06/04/2020   CKD (chronic kidney disease) stage 3, GFR 30-59 ml/min ([place]) 06/04/2020   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[place]) 06/04/2020   Acute respiratory disease due to COVID-19 virus 05/03/2019   Renal insufficiency 05/03/2019   GOUT 01/27/2010   OBESITY 01/27/2010   Insulin-requiring or dependent type II diabetes mellitus ([place]) 02/20/2008   HYPERTENSION, BENIGN ESSENTIAL 02/20/2008   Past Medical History:  Diagnosis Date   Anemia    Diabetes mellitus    Gout    History of COVID-19 04/2019   Hypertension    PVD (peripheral vascular disease) ([place])    s/p R BKA    Family History  Problem Relation Age of Onset   Diabetes Neg Hx     Past Surgical History:  Procedure Laterality Date   AMPUTATION Right 06/06/2020   Procedure: AMPUTATION 5TH RAY;  Surgeon: [name], MD;  Location: [facility];  Service: Orthopedics;  Laterality: Right;   AMPUTATION Right 06/08/2020   Procedure: RIGHT 4TH RAY AMPUTATION;  Surgeon: [name], MD;  Location: [facility];  Service: Orthopedics;  Laterality: Right;   AMPUTATION Right 08/27/2020   Procedure: AMPUTATION BELOW KNEE;  Surgeon: Jessy Oto, MD;  Location: [facility];  Service: Orthopedics;  Laterality: Right;   AMPUTATION Left 12/30/2020   Procedure: LEFT BELOW KNEE AMPUTATION;  Surgeon: [name], MD;  Location: [facility];  Service: Orthopedics;  Laterality: Left;   CHOLECYSTECTOMY N/A 09/25/2020   Procedure: LAPAROSCOPIC CHOLECYSTECTOMY;  Surgeon: Jesusita Oka, MD;  [facility];   Service: General;  Laterality: N/A;   Social History   Occupational History   Occupation: logistics  Tobacco Use   Smoking status: Never   Smokeless tobacco: Never  Vaping Use   Vaping Use: Never used  Substance and Sexual Activity   Alcohol use: No   Drug use: No   Sexual activity: Not Currently

## 2021-10-15 ENCOUNTER — Encounter (HOSPITAL_BASED_OUTPATIENT_CLINIC_OR_DEPARTMENT_OTHER): Payer: Self-pay | Admitting: Emergency Medicine

## 2021-10-15 ENCOUNTER — Emergency Department (HOSPITAL_BASED_OUTPATIENT_CLINIC_OR_DEPARTMENT_OTHER)
Admission: EM | Admit: 2021-10-15 | Discharge: 2021-10-15 | Disposition: A | Discharge status: Home / Self Care | Payer: Medicaid Other | Attending: Emergency Medicine | Admitting: Emergency Medicine

## 2021-10-15 ENCOUNTER — Other Ambulatory Visit: Payer: Self-pay

## 2021-10-15 DIAGNOSIS — Z794 Long term (current) use of insulin: Secondary | ICD-10-CM | POA: Diagnosis not present

## 2021-10-15 DIAGNOSIS — L89892 Pressure ulcer of other site, stage 2: Secondary | ICD-10-CM | POA: Diagnosis not present

## 2021-10-15 DIAGNOSIS — L8992 Pressure ulcer of unspecified site, stage 2: Secondary | ICD-10-CM

## 2021-10-15 DIAGNOSIS — Z4800 Encounter for change or removal of nonsurgical wound dressing: Secondary | ICD-10-CM | POA: Insufficient documentation

## 2021-10-15 DIAGNOSIS — M7989 Other specified soft tissue disorders: Secondary | ICD-10-CM | POA: Diagnosis present

## 2021-10-15 DIAGNOSIS — Z5189 Encounter for other specified aftercare: Secondary | ICD-10-CM

## 2021-10-15 LAB — CBG MONITORING, ED: Glucose-Capillary: 135 mg/dL — ABNORMAL HIGH (ref 70–99)

## 2021-10-15 MED ORDER — DOXYCYCLINE HYCLATE 100 MG PO CAPS: 100.0000 mg | ORAL_CAPSULE | Freq: Two times a day (BID) | ORAL | 0 refills | Status: AC

## 2021-10-15 NOTE — ED Notes (Signed)
Xeroform dressing and padded dressing applied to left amputation. Discharge instructions reviewed with patient.

## 2021-10-15 NOTE — ED Triage Notes (Signed)
Left BKA bleeding, pt noticed an area of concern to his amputee as well. Felt like a sore per pt.

## 2021-10-15 NOTE — Discharge Instructions (Signed)
Please go to the pharmacy and pick up your prescription today for doxycycline antibiotic.  You will take this twice a day instead of the Bactrim that was prescribed in the office (he told me he had not started taking the Bactrim yet).  Please follow-up with the orthopedic surgeon tomorrow.  He may wish to make further changes to your medications.  We dressed your wound with extra padding today to help relieve some of the direct pressure on your ulcer.  Try to take your prosthetics off and spend as little as time on the left leg as possible

## 2021-10-15 NOTE — ED Provider Notes (Signed)
[facility] EMERGENCY DEPT Provider Note   CSN: [id number] Arrival date & time: 10/15/21  1218     History  Chief Complaint  Patient presents with   amputation bleeding    Patrick [name] is a 56 y.o. male presenting to the ED with complaint of pressure sore and bleeding to his left below the knee amputation site.  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  He reports his amputation was over a year ago.  He noted to ulcerations on his left lower leg about a week ago.  He was seen 3 to 4 days ago in orthopedic clinic, per my review of external records, and was prescribed Bactrim at that time, but states that "maybe it was never called in but never picked it up".  He is not currently taking antibiotics.  He denies fevers or chills.  He denies purulent drainage.  He reports he has some bleeding near the very bottom of the stump site where there is direct contact of the prosthesis and he is weightbearing.  HPI     Home Medications Prior to Admission medications   Medication Sig Start Date End Date Taking? Authorizing Provider  doxycycline (VIBRAMYCIN) 100 MG capsule Take 1 capsule (100 mg total) by mouth 2 (two) times daily for 7 days. 10/15/21 10/22/21 Yes Prateek Knipple, Carola Rhine, MD  acetaminophen (TYLENOL) 500 MG tablet Take 1,000 mg by mouth every 6 (six) hours as needed for mild pain or headache.    [provider]  ascorbic acid (VITAMIN C) 1000 MG tablet Take 1 tablet (1,000 mg total) by mouth daily. Patient not taking: Reported on 12/28/2020 09/08/20   Angiulli, Lavon Paganini, PA-C  furosemide (LASIX) 20 MG tablet Take 1 tablet (20 mg total) by mouth daily as needed. Patient taking differently: Take 20 mg by mouth 2 (two) times daily. 11/03/20 11/03/21  [name], MD  gabapentin (NEURONTIN) 300 MG capsule Take 1 capsule (300 mg total) by mouth 3 (three) times daily. Patient taking differently: Take 300 mg by mouth 3 (three) times daily as needed (pain). 09/08/20    Angiulli, Lavon Paganini, PA-C  indomethacin (INDOCIN) 50 MG capsule Take 50 mg by mouth 3 (three) times daily as needed for moderate pain. 11/20/20   [provider]  insulin aspart (NOVOLOG FLEXPEN) 100 UNIT/ML FlexPen Inject 4 Units into the skin 3 (three) times daily with meals. 09/08/20   Angiulli, Lavon Paganini, PA-C  insulin glargine (LANTUS SOLOSTAR) 100 UNIT/ML Solostar Pen Inject 25 Units into the skin 2 (two) times daily. 09/08/20   Angiulli, Lavon Paganini, PA-C  insulin lispro (HUMALOG) 100 UNIT/ML KwikPen Inject 5 Units into the skin 3 (three) times daily.    [provider]  Insulin Pen Needle 32G X 4 MM MISC Use as directed 09/08/20   [name], Clide Deutscher, MD  Insulin Pen Needle 32G X 4 MM MISC Use as directed 09/09/20   Angiulli, Lavon Paganini, PA-C  metFORMIN (GLUCOPHAGE) 500 MG tablet Take 500 mg by mouth 2 (two) times daily. 12/18/20   [provider]  metoprolol tartrate (LOPRESSOR) 25 MG tablet Take 0.5 tablets (12.5 mg total) by mouth 2 (two) times daily. 09/08/20   Angiulli, Lavon Paganini, PA-C  Multiple Vitamins-Minerals (CERTAVITE/ANTIOXIDANTS) TABS Take 1 tablet by mouth daily. Patient not taking: Reported on 12/28/2020 09/29/20   Lavina Hamman, MD  oxyCODONE-acetaminophen (PERCOCET/ROXICET) 5-325 MG tablet Take 1 tablet by mouth every 6 (six) hours as needed. 10/10/21   Coralyn Pear,  Lodema Hong, NP  polyethylene glycol powder (GLYCOLAX/MIRALAX) 17 GM/SCOOP powder Take 17 g by mouth daily as needed for moderate constipation. 09/29/20   Lavina Hamman, MD  sulfamethoxazole-trimethoprim (BACTRIM DS) 800-160 MG tablet Take 1 tablet by mouth 2 (two) times daily. 10/10/21   Suzan Slick, NP  tiZANidine (ZANAFLEX) 4 MG tablet Take 1 tablet (4 mg total) by mouth 2 (two) times daily as needed for muscle spasms. 09/29/20   Lavina Hamman, MD  Zinc Sulfate 220 (50 Zn) MG TABS Take 1 tablet (220 mg total) by mouth daily. Patient not taking: Reported on 12/28/2020 09/08/20   Spring, Lavon Paganini, PA-C       Allergies    Patient has no known allergies.    Review of Systems   Review of Systems  Physical Exam Updated Vital Signs BP (!) 153/90   Pulse 87   Temp 98.2 F (36.8 C)   Resp 18   SpO2 100%  Physical Exam Constitutional:      General: He is not in acute distress. HENT:     Head: Normocephalic and atraumatic.  Eyes:     Conjunctiva/sclera: Conjunctivae normal.     Pupils: Pupils are equal, round, and reactive to light.  Cardiovascular:     Rate and Rhythm: Normal rate and regular rhythm.  Pulmonary:     Effort: Pulmonary effort is normal. No respiratory distress.  Musculoskeletal:     Comments: Bilateral below the knee amputation, 2 ulcerations noted on the lower aspect of the left amputation site, there is malodor around the area, no active drainage or fluctuance. There is some maceration and a small stage II ulceration developing at the very base of the left amputation site  Skin:    General: Skin is warm and dry.  Neurological:     General: No focal deficit present.     Mental Status: He is alert. Mental status is at baseline.  Psychiatric:        Mood and Affect: Mood normal.        Behavior: Behavior normal.     ED Results / Procedures / Treatments   Labs (all labs ordered are listed, but only abnormal results are displayed) Labs Reviewed  CBG MONITORING, ED - Abnormal; Notable for the following components:      Result Value   Glucose-Capillary 135 (*)    All other components within normal limits    EKG None  Radiology No results found.  Procedures Procedures    Medications Ordered in ED Medications - No data to display  ED Course/ Medical Decision Making/ A&P                           Medical Decision Making Risk Prescription drug management.   Patient is here with concern for pressure sore on his left amputation site that he says has been bleeding or oozing.  I suspect this is likely a pressure sore, and is a difficult situation as he  has a double amputee and requires his prosthesis to ambulate.  We will apply Xeroform to the area and additional padding, I advised that he try to remove the prosthetic is much as possible and try to put minimal weight on this at home for a few days.  He has follow-up appointment tomorrow with Dr.Duda from orthopedics.    There is malodor to the area and 2 ulcerations, and I think is reasonable to put him on antibiotics.  I would prefer doxycycline as it is better MRSA/staph coverage.  I called this into his pharmacy and he will pick it up today and begin it tonight.  I have lower suspicion for necrotizing fasciitis.  He does have some brawny edema right around the stump site but it is not tracking up his leg.  Does not appear to have systemic infection.  He is well-appearing otherwise.  Overall the wound appears extremely similar to the photographs that were taken in the office 4 days ago, I do not see any worsening of redness or erythema.        Final Clinical Impression(s) / ED Diagnoses Final diagnoses:  Visit for wound check  Pressure injury, stage 2, unspecified location [facility])    Rx / DC Orders ED Discharge Orders          Ordered    doxycycline (VIBRAMYCIN) 100 MG capsule  2 times daily        10/15/21 1508              Wyvonnia Dusky, MD 10/15/21 1510

## 2021-11-07 ENCOUNTER — Other Ambulatory Visit: Payer: Self-pay

## 2021-11-07 ENCOUNTER — Encounter (HOSPITAL_COMMUNITY): Payer: Self-pay | Admitting: Emergency Medicine

## 2021-11-07 ENCOUNTER — Emergency Department (HOSPITAL_COMMUNITY)
Admission: EM | Admit: 2021-11-07 | Discharge: 2021-11-08 | Disposition: A | Discharge status: Home / Self Care | Payer: Medicaid Other | Attending: Emergency Medicine | Admitting: Emergency Medicine

## 2021-11-07 DIAGNOSIS — Z79899 Other long term (current) drug therapy: Secondary | ICD-10-CM | POA: Insufficient documentation

## 2021-11-07 DIAGNOSIS — R519 Headache, unspecified: Secondary | ICD-10-CM | POA: Insufficient documentation

## 2021-11-07 DIAGNOSIS — E119 Type 2 diabetes mellitus without complications: Secondary | ICD-10-CM | POA: Diagnosis not present

## 2021-11-07 DIAGNOSIS — R9089 Other abnormal findings on diagnostic imaging of central nervous system: Secondary | ICD-10-CM | POA: Insufficient documentation

## 2021-11-07 DIAGNOSIS — Z794 Long term (current) use of insulin: Secondary | ICD-10-CM | POA: Insufficient documentation

## 2021-11-07 NOTE — ED Provider Triage Note (Signed)
Emergency Medicine Provider Triage Evaluation Note  ALFONS SULKOWSKI , a 56 y.o. male  was evaluated in triage.  Pt complains of abnormal labs. Seen yesterday had an MRI for eyes in [facility], wanted to r/o "head issues". MRI concerning for possible posterior head bleed. Sent here for further work up. Denies changes in gait or balance. No headache. No visual disturbance.   H/o diabetes, bilateral leg amputees  Review of Systems  Positive: See above Negative: See above  Physical Exam  BP (!) 168/100 (BP Location: Right Arm)   Pulse 95   Temp 99.5 F (37.5 C) (Oral)   Resp 18   Ht 6\' 4"  (1.93 m)   Wt 133.8 kg   SpO2 96%   BMI 35.91 kg/m  Gen:   Awake, no distress   Resp:  Normal effort  MSK:   Moves extremities without difficulty  Other:  Gait normal, finger to nose intact  Medical Decision Making  Medically screening exam initiated at 4:48 PM.  Appropriate orders placed.  Jolinda Croak was informed that the remainder of the evaluation will be completed by another provider, this initial triage assessment does not replace that evaluation, and the importance of remaining in the ED until their evaluation is complete.  No orders at this time   Fischer, Halley 11/07/21 1656

## 2021-11-07 NOTE — ED Triage Notes (Signed)
Patient was sent for abnormal MRI that was done yesterday. Patient has disc from scan. Patient unsure what was found but was told to come here for further evaluation. Patient states he feels like his normal self. Patient bilateral BKAs and ambulatory with cane.

## 2021-11-08 ENCOUNTER — Encounter (HOSPITAL_COMMUNITY): Payer: Self-pay

## 2021-11-08 ENCOUNTER — Emergency Department (HOSPITAL_COMMUNITY): Payer: Medicaid Other

## 2021-11-08 NOTE — ED Notes (Signed)
Transported to MRI

## 2021-11-08 NOTE — Discharge Instructions (Signed)
Your imaging was unremarkable, does show some chronic ischemia I would like you to follow-up with your PCP for further evaluation of this.  Come back to the emergency department if you develop chest pain, shortness of breath, severe abdominal pain, uncontrolled nausea, vomiting, diarrhea.

## 2021-11-08 NOTE — ED Notes (Signed)
Patient transported to CT 

## 2021-11-08 NOTE — ED Provider Notes (Signed)
[facility] EMERGENCY DEPARTMENT Provider Note   CSN: [id number] Arrival date & time: 11/07/21  1416     History  Chief Complaint  Patient presents with   Abnormal Imaging    Patrick [name] is a 56 y.o. male.  HPI   Medical history including diabetes, bilateral above-the-knee amputation, presents with complaint of abnormal imaging.  Patient states that he is getting injections in his eyes by his ophthalmologist, states that he was getting routine MRI of the brain and eyes, and was notified by his doctor that he had to come into the ER for further evaluation as there is some the abnormal on his head CT.  He is not having any complaints, he is not endorsing headaches change in vision paresthesias or weakness of the upper or lower extremities, no recent head trauma, not anticoag, denies any neck pain.  Reviewed patient's imaging, MRI brain reveals acute hemorrhage, and was recommended to get CT imaging for further evaluation.   Home Medications Prior to Admission medications   Medication Sig Start Date End Date Taking? Authorizing Provider  acetaminophen (TYLENOL) 500 MG tablet Take 1,000 mg by mouth every 6 (six) hours as needed for mild pain or headache.   Yes [provider]  amLODipine (NORVASC) 5 MG tablet Take 5 mg by mouth daily. 08/18/21  Yes [provider]  doxycycline (VIBRA-TABS) 100 MG tablet Take 100 mg by mouth 2 (two) times daily. 10/10/21  Yes [provider]  gabapentin (NEURONTIN) 300 MG capsule Take 1 capsule (300 mg total) by mouth 3 (three) times daily. Patient taking differently: Take 300 mg by mouth 3 (three) times daily as needed (pain). 09/08/20  Yes Angiulli, Lavon Paganini, PA-C  indomethacin (INDOCIN) 50 MG capsule Take 50 mg by mouth 3 (three) times daily as needed for moderate pain. 11/20/20  Yes [provider]  insulin aspart (NOVOLOG FLEXPEN) 100 UNIT/ML FlexPen Inject 4 Units into the skin 3 (three) times daily  with meals. 09/08/20  Yes Angiulli, Lavon Paganini, PA-C  insulin glargine (LANTUS SOLOSTAR) 100 UNIT/ML Solostar Pen Inject 25 Units into the skin 2 (two) times daily. 09/08/20  Yes Angiulli, Lavon Paganini, PA-C  JARDIANCE 25 MG TABS tablet Take 25 mg by mouth daily. 09/28/21  Yes [provider]  losartan-hydrochlorothiazide (HYZAAR) 100-25 MG tablet Take 1 tablet by mouth daily. 08/18/21  Yes [provider]  metoprolol tartrate (LOPRESSOR) 25 MG tablet Take 0.5 tablets (12.5 mg total) by mouth 2 (two) times daily. 09/08/20  Yes Angiulli, Lavon Paganini, PA-C  OZEMPIC, 0.25 OR 0.5 MG/DOSE, 2 MG/3ML SOPN Inject 0.5 mg into the skin once a week. 10/07/21  Yes [provider]  tiZANidine (ZANAFLEX) 4 MG tablet Take 1 tablet (4 mg total) by mouth 2 (two) times daily as needed for muscle spasms. 09/29/20  Yes Lavina Hamman, MD  traZODone (DESYREL) 100 MG tablet Take 100 mg by mouth at bedtime. 10/07/21  Yes [provider]  ascorbic acid (VITAMIN C) 1000 MG tablet Take 1 tablet (1,000 mg total) by mouth daily. Patient not taking: Reported on 11/08/2021 09/08/20   Angiulli, Lavon Paganini, PA-C  furosemide (LASIX) 20 MG tablet Take 1 tablet (20 mg total) by mouth daily as needed. Patient not taking: Reported on 11/08/2021 11/03/20 11/03/21  [name], MD  Insulin Pen Needle 32G X 4 MM MISC Use as directed 09/08/20   [name], Clide Deutscher, MD  Insulin Pen Needle 32G X 4 MM MISC Use as  directed 09/09/20   Angiulli, Lavon Paganini, PA-C  Multiple Vitamins-Minerals (CERTAVITE/ANTIOXIDANTS) TABS Take 1 tablet by mouth daily. Patient not taking: Reported on 12/28/2020 09/29/20   Lavina Hamman, MD  oxyCODONE-acetaminophen (PERCOCET/ROXICET) 5-325 MG tablet Take 1 tablet by mouth every 6 (six) hours as needed. Patient not taking: Reported on 11/08/2021 10/10/21   Suzan Slick, NP  Zinc Sulfate 220 (50 Zn) MG TABS Take 1 tablet (220 mg total) by mouth daily. Patient not taking: Reported on 12/28/2020 09/08/20    [place], Lavon Paganini, PA-C      Allergies    Patient has no known allergies.    Review of Systems   Review of Systems  Constitutional:  Negative for chills and fever.  Respiratory:  Negative for shortness of breath.   Cardiovascular:  Negative for chest pain.  Gastrointestinal:  Negative for abdominal pain.  Neurological:  Negative for headaches.    Physical Exam Updated Vital Signs BP (!) 162/87 (BP Location: Right Arm)   Pulse 90   Temp 98 F (36.7 C) (Oral)   Resp 19   Ht 6\' 4"  (1.93 m)   Wt 133.8 kg   SpO2 99%   BMI 35.91 kg/m  Physical Exam Vitals and nursing note reviewed.  Constitutional:      General: He is not in acute distress.    Appearance: He is not ill-appearing.  HENT:     Head: Normocephalic and atraumatic.     Nose: No congestion.  Eyes:     Conjunctiva/sclera: Conjunctivae normal.  Cardiovascular:     Rate and Rhythm: Normal rate and regular rhythm.     Pulses: Normal pulses.     Heart sounds: No murmur heard.    No friction rub. No gallop.  Pulmonary:     Effort: No respiratory distress.     Breath sounds: No wheezing, rhonchi or rales.  Musculoskeletal:     Comments: Bilateral amputee above the knees.  Skin:    General: Skin is warm and dry.  Neurological:     Mental Status: He is alert.     GCS: GCS eye subscore is 4. GCS verbal subscore is 5. GCS motor subscore is 6.     Sensory: Sensation is intact.     Motor: No weakness.     Coordination: Romberg sign negative. Finger-Nose-Finger Test normal.     Comments: Cranial nerves II through XII grossly intact no difficulty with word finding following two-step commands no regular weakness present.    Psychiatric:        Mood and Affect: Mood normal.     ED Results / Procedures / Treatments   Labs (all labs ordered are listed, but only abnormal results are displayed) Labs Reviewed - No data to display  EKG None  Radiology MR BRAIN WO CONTRAST  Result Date: 11/08/2021 CLINICAL DATA:   Headache chronic with new features or increased frequency. EXAM: MRI HEAD WITHOUT CONTRAST TECHNIQUE: Multiplanar, multiecho pulse sequences of the brain and surrounding structures were obtained without intravenous contrast. COMPARISON:  Head CT from earlier today FINDINGS: Brain: The area highlighted on prior head CT localizes to the choroid plexus and is attributed to calcification. Rounded hypointense area in the right occipital white matter on gradient imaging which was described on outside brain MRI from 2 days ago. No discrete mass is seen on the other sequences and the finding is occult on head CT, attributed to nonspecific remote microhemorrhage. No generalized chronic hemorrhagic injury or discrete cavernoma. No acute  or subacute infarct, acute hemorrhage, hydrocephalus, or collection. No masslike findings FLAIR hyperintensity in the cerebral white matter with nonspecific pattern but medical history suggesting chronic small vessel ischemia. Vascular: Major flow voids are preserved Skull and upper cervical spine: Normal marrow signal Sinuses/Orbits: Negative IMPRESSION: 1. Prior head CT finding reflects choroid calcification. No acute intracranial hemorrhage. 2. Small area of susceptibility artifact in the right occipital lobe as described on outside brain MRI 2 days ago. No underlying cavernoma or generalized chronic hemorrhagic injury, nonspecific in isolation. 3. 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 Electronically Signed   By: Jorje Guild M.D.   On: 11/08/2021 06:17   CT Head Wo Contrast  Result Date: 11/08/2021 CLINICAL DATA:  Initial evaluation for acute headache. EXAM: CT HEAD WITHOUT CONTRAST TECHNIQUE: Contiguous axial images were obtained from the base of the skull through the vertex without intravenous contrast. RADIATION DOSE REDUCTION: This exam was performed according to the departmental dose-optimization program which includes automated  exposure control, adjustment of the mA and/or kV according to patient size and/or use of iterative reconstruction technique. COMPARISON:  None Available. FINDINGS: Brain: Cerebral volume within normal limits for age. Chronic microvascular ischemic disease noted involving the supratentorial cerebral white matter. There are a few subcentimeter hypodensities noted about the fourth ventricle, largest of which measures 5 mm (series 3, image 10, 9). Possible additional small focus near the left foramen of Luschka (series 3, image 8). While these findings may reflect small foci of calcification, possible small foci of hemorrhage are difficult to exclude. No associated mass effect. No other acute intracranial hemorrhage. No acute large vessel territory infarct. No mass lesion, mass effect or midline shift. No hydrocephalus or extra-axial fluid collection. Vascular: No hyperdense vessel. Skull: Scalp soft tissues within normal limits.  Calvarium intact. Sinuses/Orbits: Globes and orbital soft tissues within normal limits. Paranasal sinuses and mastoid air cells are clear. Other: None. IMPRESSION: 1. Few subcentimeter hyperdensities positioned about the fourth ventricle, largest of which measures 5 mm. While these findings may reflect small foci of calcification, possible small foci of hemorrhage are difficult to exclude. Short interval follow-up head CT in 8-12 hours recommended for further evaluation. 2. No other acute intracranial abnormality. 3. Underlying chronic microvascular ischemic disease. Multiple attempts have been made to contact the ordering provider by telephone in the emergency department to convey these findings, but have been unable to reach anyone. Results will be conveyed as soon as possible. Electronically Signed   By: Jeannine Boga M.D.   On: 11/08/2021 02:45    Procedures Procedures    Medications Ordered in ED Medications - No data to display  ED Course/ Medical Decision Making/ A&P                            Medical Decision Making Amount and/or Complexity of Data Reviewed Radiology: ordered.   This patient presents to the ED for concern of abnormal imaging, this involves an extensive number of treatment options, and is a complaint that carries with it a high risk of complications and morbidity.  The differential diagnosis includes CVA, intracranial head bleed, meningitis    Additional history obtained:  Additional history obtained from N/A External records from outside source obtained and reviewed including previous imaging   Co morbidities that complicate the patient evaluation  Diabetes  Social Determinants of Health:  N/A    Lab Tests:  I Ordered, and personally interpreted  labs.  The pertinent results include: N/A   Imaging Studies ordered:  I ordered imaging studies including Ct head, MRI brain I independently visualized and interpreted imaging which showed CT head shows nonspecific hyperdensities possible calcification versus acute bleed, MRI brain was negative for acute findings I agree with the radiologist interpretation   Cardiac Monitoring:  The patient was maintained on a cardiac monitor.  I personally viewed and interpreted the cardiac monitored which showed an underlying rhythm of: N/A   Medicines ordered and prescription drug management:  I ordered medication including N/A I have reviewed the patients home medicines and have made adjustments as needed  Critical Interventions:  N/A   Reevaluation:  Presents with abnormal imaging, I reviewed his previous imaging concern for possible acute hemorrhage, will obtain CT imaging as recommended by neurology.  CT scan shows possible calcification versus acute bleed, will consult with neurology for further recommendations  Patient would like to repeat MRI, scan was negative, he is agreement plan discharge at this time.  Consultations Obtained:  I requested consultation with the  neurologist Dr. Malen Gauze,  and discussed lab and imaging findings as well as pertinent plan - they recommend: Recommends repeat CT scan in 8 to 12 hours or repeat MRI. Spoke with radiologist Dr. Jeannine Boga who recommends repeating the MRI or CT scan as he is unable to see the previous MRI done at [facility]    Test Considered:  N/A    Rule out low suspicion for internal head bleed and or mass as MRI is negative. low suspicion for CVA she has no focal deficit present my exam.  Low suspicion for dissection of the vertebral or carotid artery as presentation atypical of etiology.  Low suspicion for meningitis as she has no meningeal sign present.     Dispostion and problem list  After consideration of the diagnostic results and the patients response to treatment, I feel that the patent would benefit from discharge.  Patient was made aware of the MRI findings, we will have him follow-up with his PCP for further evaluation and strict return precautions.            Final Clinical Impression(s) / ED Diagnoses Final diagnoses:  Abnormal imaging of central nervous system    Rx / DC Orders ED Discharge Orders     None         Marcello Fennel, PA-C 11/08/21 [id number]    Truddie Hidden, MD 11/08/21 [phone]

## 2021-11-19 ENCOUNTER — Emergency Department (HOSPITAL_BASED_OUTPATIENT_CLINIC_OR_DEPARTMENT_OTHER)
Admission: EM | Admit: 2021-11-19 | Discharge: 2021-11-19 | Disposition: A | Discharge status: Home / Self Care | Payer: BC Managed Care – PPO | Attending: Emergency Medicine | Admitting: Emergency Medicine

## 2021-11-19 ENCOUNTER — Other Ambulatory Visit: Payer: Self-pay

## 2021-11-19 ENCOUNTER — Encounter (HOSPITAL_BASED_OUTPATIENT_CLINIC_OR_DEPARTMENT_OTHER): Payer: Self-pay

## 2021-11-19 DIAGNOSIS — M109 Gout, unspecified: Secondary | ICD-10-CM | POA: Insufficient documentation

## 2021-11-19 DIAGNOSIS — E119 Type 2 diabetes mellitus without complications: Secondary | ICD-10-CM | POA: Insufficient documentation

## 2021-11-19 DIAGNOSIS — Z8616 Personal history of COVID-19: Secondary | ICD-10-CM | POA: Insufficient documentation

## 2021-11-19 DIAGNOSIS — M79642 Pain in left hand: Secondary | ICD-10-CM | POA: Insufficient documentation

## 2021-11-19 DIAGNOSIS — Z794 Long term (current) use of insulin: Secondary | ICD-10-CM | POA: Diagnosis not present

## 2021-11-19 DIAGNOSIS — Z79899 Other long term (current) drug therapy: Secondary | ICD-10-CM | POA: Insufficient documentation

## 2021-11-19 DIAGNOSIS — I10 Essential (primary) hypertension: Secondary | ICD-10-CM | POA: Insufficient documentation

## 2021-11-19 DIAGNOSIS — Z8739 Personal history of other diseases of the musculoskeletal system and connective tissue: Secondary | ICD-10-CM

## 2021-11-19 MED ORDER — OXYCODONE-ACETAMINOPHEN 5-325 MG PO TABS
1.0000 | ORAL_TABLET | Freq: Once | ORAL | Status: AC
  Administered 2021-11-19: 1 via ORAL
  Filled 2021-11-19: qty 1

## 2021-11-19 MED ORDER — METHYLPREDNISOLONE 4 MG PO TBPK: ORAL_TABLET | ORAL | 0 refills | Status: DC

## 2021-11-19 MED ORDER — OXYCODONE-ACETAMINOPHEN 5-325 MG PO TABS: 1.0000 | ORAL_TABLET | Freq: Four times a day (QID) | ORAL | 0 refills | Status: DC | PRN

## 2021-11-19 NOTE — Discharge Instructions (Addendum)
You were seen today for hand pain.  This may be consistent with your gout.  Take the steroids as directed.  Take oxycodone as needed for breakthrough pain.  If you note increased swelling, redness, develop fevers or other atypical symptoms, you should be reevaluated.

## 2021-11-19 NOTE — ED Provider Notes (Signed)
[facility] EMERGENCY DEPT Provider Note   CSN: [id number] Arrival date & time: 11/19/21  1859     History  Chief Complaint  Patient presents with   Hand Pain    Patrick [name] is a 56 y.o. male.  HPI     This is a 56 year old male who presents with left hand pain.  Patient reports several day history of left hand pain and swelling.  He has a history of gout with similar symptoms in the past.  He states that he can get gout "in any of my joints."  He is no longer taking indomethacin due to insurance issues.  He states that normally that helps his pain.  He is right-handed.  He denies injury.  He denies fevers.  He denies redness.  Pain is worse with range of motion of the wrist and flexion of the fingers.  Home Medications Prior to Admission medications   Medication Sig Start Date End Date Taking? Authorizing Provider  methylPREDNISolone (MEDROL DOSEPAK) 4 MG TBPK tablet Take as directed on package 11/19/21  Yes Sarabi Sockwell, Barbette Hair, MD  oxyCODONE-acetaminophen (PERCOCET/ROXICET) 5-325 MG tablet Take 1 tablet by mouth every 6 (six) hours as needed for severe pain. 11/19/21  Yes Kelechi Orgeron, Barbette Hair, MD  acetaminophen (TYLENOL) 500 MG tablet Take 1,000 mg by mouth every 6 (six) hours as needed for mild pain or headache.    [provider]  amLODipine (NORVASC) 5 MG tablet Take 5 mg by mouth daily. 08/18/21   [provider]  ascorbic acid (VITAMIN C) 1000 MG tablet Take 1 tablet (1,000 mg total) by mouth daily. Patient not taking: Reported on 11/08/2021 09/08/20   Angiulli, Lavon Paganini, PA-C  doxycycline (VIBRA-TABS) 100 MG tablet Take 100 mg by mouth 2 (two) times daily. 10/10/21   [provider]  furosemide (LASIX) 20 MG tablet Take 1 tablet (20 mg total) by mouth daily as needed. Patient not taking: Reported on 11/08/2021 11/03/20 11/03/21  [name], MD  gabapentin (NEURONTIN) 300 MG capsule Take 1 capsule (300 mg total) by mouth 3 (three) times  daily. Patient taking differently: Take 300 mg by mouth 3 (three) times daily as needed (pain). 09/08/20   Angiulli, Lavon Paganini, PA-C  indomethacin (INDOCIN) 50 MG capsule Take 50 mg by mouth 3 (three) times daily as needed for moderate pain. 11/20/20   [provider]  insulin aspart (NOVOLOG FLEXPEN) 100 UNIT/ML FlexPen Inject 4 Units into the skin 3 (three) times daily with meals. 09/08/20   Angiulli, Lavon Paganini, PA-C  insulin glargine (LANTUS SOLOSTAR) 100 UNIT/ML Solostar Pen Inject 25 Units into the skin 2 (two) times daily. 09/08/20   Angiulli, Lavon Paganini, PA-C  Insulin Pen Needle 32G X 4 MM MISC Use as directed 09/08/20   [name], Clide Deutscher, MD  Insulin Pen Needle 32G X 4 MM MISC Use as directed 09/09/20   Angiulli, Lavon Paganini, PA-C  JARDIANCE 25 MG TABS tablet Take 25 mg by mouth daily. 09/28/21   [provider]  losartan-hydrochlorothiazide (HYZAAR) 100-25 MG tablet Take 1 tablet by mouth daily. 08/18/21   [provider]  metoprolol tartrate (LOPRESSOR) 25 MG tablet Take 0.5 tablets (12.5 mg total) by mouth 2 (two) times daily. 09/08/20   Angiulli, Lavon Paganini, PA-C  Multiple Vitamins-Minerals (CERTAVITE/ANTIOXIDANTS) TABS Take 1 tablet by mouth daily. Patient not taking: Reported on 12/28/2020 09/29/20   Lavina Hamman, MD  OZEMPIC, 0.25 OR 0.5 MG/DOSE, 2 MG/3ML SOPN Inject 0.5 mg into  the skin once a week. 10/07/21   [provider]  tiZANidine (ZANAFLEX) 4 MG tablet Take 1 tablet (4 mg total) by mouth 2 (two) times daily as needed for muscle spasms. 09/29/20   Lavina Hamman, MD  traZODone (DESYREL) 100 MG tablet Take 100 mg by mouth at bedtime. 10/07/21   [provider]  Zinc Sulfate 220 (50 Zn) MG TABS Take 1 tablet (220 mg total) by mouth daily. Patient not taking: Reported on 12/28/2020 09/08/20   [place], Lavon Paganini, PA-C      Allergies    Patient has no known allergies.    Review of Systems   Review of Systems  Constitutional:  Negative for fever.   Musculoskeletal:        Hand and wrist pain  All other systems reviewed and are negative.   Physical Exam Updated Vital Signs BP (!) 168/93 (BP Location: Right Arm)   Pulse 96   Temp 98.2 F (36.8 C) (Oral)   Resp 20   Ht 1.905 m (6\' 3" )   Wt 133.8 kg   SpO2 99%   BMI 36.87 kg/m  Physical Exam Vitals and nursing note reviewed.  Constitutional:      Appearance: He is well-developed. He is obese. He is not ill-appearing.  HENT:     Head: Normocephalic and atraumatic.  Eyes:     Pupils: Pupils are equal, round, and reactive to light.  Cardiovascular:     Rate and Rhythm: Normal rate and regular rhythm.  Pulmonary:     Effort: Pulmonary effort is normal. No respiratory distress.  Abdominal:     Palpations: Abdomen is soft.  Musculoskeletal:     Cervical back: Neck supple.     Comments: Focused examination of the left hand shows swelling of the dorsum of the hand, there is no overlying erythema or warmth, pain with range of motion of the wrist, there is also swelling most notably into the third digit, flexion is limited secondary to pain, no fusiform swelling, 2+ radial pulse  Lymphadenopathy:     Cervical: No cervical adenopathy.  Skin:    General: Skin is warm and dry.  Neurological:     Mental Status: He is alert and oriented to person, place, and time.  Psychiatric:        Mood and Affect: Mood normal.     ED Results / Procedures / Treatments   Labs (all labs ordered are listed, but only abnormal results are displayed) Labs Reviewed - No data to display  EKG None  Radiology No results found.  Procedures Procedures    Medications Ordered in ED Medications  oxyCODONE-acetaminophen (PERCOCET/ROXICET) 5-325 MG per tablet 1 tablet (1 tablet Oral Given 11/19/21 2315)    ED Course/ Medical Decision Making/ A&P                           Medical Decision Making Risk Prescription drug management.   This patient presents to the ED for concern of left hand  and wrist swelling, this involves an extensive number of treatment options, and is a complaint that carries with it a high risk of complications and morbidity.  I considered the following differential and admission for this acute, potentially life threatening condition.  The differential diagnosis includes gout, arthritis, septic arthritis, cellulitis  MDM:    This is a 56 year old male who presents with left hand and wrist pain and swelling.  He is nontoxic and vital  signs are reassuring.  Reports his symptoms are consistent with prior gout symptoms.  He does have more swelling than I would anticipate although there is no significant redness or erythema.  He has passive range of motion pain.  He has not had any fevers or systemic symptoms.  The joint is not particularly hot.  Given similar symptoms with gout in the past, will treat as a gouty attack although I have advised the patient if he does not have improvement of his symptoms or develops fevers or redness, he needs to be reevaluated.  Do not feel he needs imaging at this time.  I did review his chart.  It looks like his last creatinine was 2.59.  Do not feel he is a candidate for anti-inflammatory medications.  We will start on a Medrol Dosepak and will give a short course of Percocet.  (Labs, imaging, consults)  Labs: I Ordered, and personally interpreted labs.  The pertinent results include: None  Imaging Studies ordered: I ordered imaging studies including none I independently visualized and interpreted imaging. I agree with the radiologist interpretation  Additional history obtained from review.  External records from outside source obtained and reviewed including prior labs  Cardiac Monitoring: The patient was maintained on a cardiac monitor.  I personally viewed and interpreted the cardiac monitored which showed an underlying rhythm of: Normal sinus rhythm  Reevaluation: After the interventions noted above, I reevaluated the  patient and found that they have :improved  Social Determinants of Health: Lives independently  Disposition: Discharge  Co morbidities that complicate the patient evaluation  Past Medical History:  Diagnosis Date   Anemia    Diabetes mellitus    Gout    History of COVID-19 04/2019   Hypertension    PVD (peripheral vascular disease) ([place])    s/p R BKA     Medicines Meds ordered this encounter  Medications   oxyCODONE-acetaminophen (PERCOCET/ROXICET) 5-325 MG per tablet 1 tablet   methylPREDNISolone (MEDROL DOSEPAK) 4 MG TBPK tablet    Sig: Take as directed on package    Dispense:  21 tablet    Refill:  0   oxyCODONE-acetaminophen (PERCOCET/ROXICET) 5-325 MG tablet    Sig: Take 1 tablet by mouth every 6 (six) hours as needed for severe pain.    Dispense:  10 tablet    Refill:  0    I have reviewed the patients home medicines and have made adjustments as needed  Problem List / ED Course: Problem List Items Addressed This Visit   None Visit Diagnoses     Left hand pain    -  Primary   History of gout                       Final Clinical Impression(s) / ED Diagnoses Final diagnoses:  Left hand pain  History of gout    Rx / DC Orders ED Discharge Orders          Ordered    methylPREDNISolone (MEDROL DOSEPAK) 4 MG TBPK tablet        11/19/21 2318    oxyCODONE-acetaminophen (PERCOCET/ROXICET) 5-325 MG tablet  Every 6 hours PRN        11/19/21 2318              Merryl Hacker, MD 11/19/21 2331

## 2021-11-19 NOTE — ED Triage Notes (Signed)
Complaining of left hand pain and swelling. Started a couple days ago. Thinks it is a gout flare.

## 2021-11-23 ENCOUNTER — Other Ambulatory Visit: Payer: Self-pay

## 2021-11-23 ENCOUNTER — Encounter (HOSPITAL_COMMUNITY): Payer: Self-pay | Admitting: Emergency Medicine

## 2021-11-23 ENCOUNTER — Emergency Department (HOSPITAL_COMMUNITY)
Admission: EM | Admit: 2021-11-23 | Discharge: 2021-11-23 | Disposition: A | Discharge status: Home / Self Care | Payer: BC Managed Care – PPO | Attending: Emergency Medicine | Admitting: Emergency Medicine

## 2021-11-23 DIAGNOSIS — Z79899 Other long term (current) drug therapy: Secondary | ICD-10-CM | POA: Diagnosis not present

## 2021-11-23 DIAGNOSIS — M109 Gout, unspecified: Secondary | ICD-10-CM | POA: Diagnosis not present

## 2021-11-23 DIAGNOSIS — Z794 Long term (current) use of insulin: Secondary | ICD-10-CM | POA: Insufficient documentation

## 2021-11-23 DIAGNOSIS — Z7984 Long term (current) use of oral hypoglycemic drugs: Secondary | ICD-10-CM | POA: Insufficient documentation

## 2021-11-23 DIAGNOSIS — N189 Chronic kidney disease, unspecified: Secondary | ICD-10-CM | POA: Insufficient documentation

## 2021-11-23 DIAGNOSIS — I129 Hypertensive chronic kidney disease with stage 1 through stage 4 chronic kidney disease, or unspecified chronic kidney disease: Secondary | ICD-10-CM | POA: Diagnosis not present

## 2021-11-23 DIAGNOSIS — E1122 Type 2 diabetes mellitus with diabetic chronic kidney disease: Secondary | ICD-10-CM | POA: Insufficient documentation

## 2021-11-23 MED ORDER — PREDNISONE 20 MG PO TABS: ORAL_TABLET | ORAL | 0 refills | Status: DC

## 2021-11-23 MED ORDER — OXYCODONE-ACETAMINOPHEN 5-325 MG PO TABS: 1.0000 | ORAL_TABLET | ORAL | 0 refills | Status: DC | PRN

## 2021-11-23 NOTE — Discharge Instructions (Signed)
Take the prescribed medication as directed. °Follow-up with your primary care doctor. °Return to the ED for new or worsening symptoms. °

## 2021-11-23 NOTE — ED Triage Notes (Signed)
Pt c/o gout flair up X4 days, was given steroids however there was no effect.  He is unable to take normal meds due to kidney function.  Hands are swollen and warm to the touch.

## 2021-11-23 NOTE — ED Provider Notes (Signed)
[facility] EMERGENCY DEPARTMENT Provider Note   CSN: [id number] Arrival date & time: 11/23/21  2135     History  Chief Complaint  Patient presents with   Gout    Patrick [name] is a 56 y.o. male.  The history is provided by the patient and medical records.   56 year old male with history of hypertension, CKD, obesity, diabetes, presenting to the ED with gout flare.  States issues ongoing for the last 4 days, notably in left hand and right shoulder.  He was seen a few days ago at [facility] and given medrol dosepak and percocet, no real relief.  States indomethacin and allopurinol afterwards usually work better.  No fevers/chills. No new injuries/trauma.  Has no eaten red meat, fish, alcohol, etc.  Home Medications Prior to Admission medications   Medication Sig Start Date End Date Taking? Authorizing Provider  acetaminophen (TYLENOL) 500 MG tablet Take 1,000 mg by mouth every 6 (six) hours as needed for mild pain or headache.    [provider]  amLODipine (NORVASC) 5 MG tablet Take 5 mg by mouth daily. 08/18/21   [provider]  ascorbic acid (VITAMIN C) 1000 MG tablet Take 1 tablet (1,000 mg total) by mouth daily. Patient not taking: Reported on 11/08/2021 09/08/20   Angiulli, Lavon Paganini, PA-C  doxycycline (VIBRA-TABS) 100 MG tablet Take 100 mg by mouth 2 (two) times daily. 10/10/21   [provider]  furosemide (LASIX) 20 MG tablet Take 1 tablet (20 mg total) by mouth daily as needed. Patient not taking: Reported on 11/08/2021 11/03/20 11/03/21  [name], MD  gabapentin (NEURONTIN) 300 MG capsule Take 1 capsule (300 mg total) by mouth 3 (three) times daily. Patient taking differently: Take 300 mg by mouth 3 (three) times daily as needed (pain). 09/08/20   Angiulli, Lavon Paganini, PA-C  indomethacin (INDOCIN) 50 MG capsule Take 50 mg by mouth 3 (three) times daily as needed for moderate pain. 11/20/20   [provider]   insulin aspart (NOVOLOG FLEXPEN) 100 UNIT/ML FlexPen Inject 4 Units into the skin 3 (three) times daily with meals. 09/08/20   Angiulli, Lavon Paganini, PA-C  insulin glargine (LANTUS SOLOSTAR) 100 UNIT/ML Solostar Pen Inject 25 Units into the skin 2 (two) times daily. 09/08/20   Angiulli, Lavon Paganini, PA-C  Insulin Pen Needle 32G X 4 MM MISC Use as directed 09/08/20   [name], Clide Deutscher, MD  Insulin Pen Needle 32G X 4 MM MISC Use as directed 09/09/20   Angiulli, Lavon Paganini, PA-C  JARDIANCE 25 MG TABS tablet Take 25 mg by mouth daily. 09/28/21   [provider]  losartan-hydrochlorothiazide (HYZAAR) 100-25 MG tablet Take 1 tablet by mouth daily. 08/18/21   [provider]  methylPREDNISolone (MEDROL DOSEPAK) 4 MG TBPK tablet Take as directed on package 11/19/21   [place], Barbette Hair, MD  metoprolol tartrate (LOPRESSOR) 25 MG tablet Take 0.5 tablets (12.5 mg total) by mouth 2 (two) times daily. 09/08/20   Angiulli, Lavon Paganini, PA-C  Multiple Vitamins-Minerals (CERTAVITE/ANTIOXIDANTS) TABS Take 1 tablet by mouth daily. Patient not taking: Reported on 12/28/2020 09/29/20   Lavina Hamman, MD  oxyCODONE-acetaminophen (PERCOCET) 5-325 MG tablet Take 1 tablet by mouth every 4 (four) hours as needed. 11/23/21  Yes Larene Pickett, PA-C  OZEMPIC, 0.25 OR 0.5 MG/DOSE, 2 MG/3ML SOPN Inject 0.5 mg into the skin once a week. 10/07/21   [provider]  predniSONE (DELTASONE) 20 MG tablet Take 40  mg by mouth daily for 3 days, then 20mg  by mouth daily for 3 days, then 10mg  daily for 3 days 11/23/21  Yes Larene Pickett, PA-C  tiZANidine (ZANAFLEX) 4 MG tablet Take 1 tablet (4 mg total) by mouth 2 (two) times daily as needed for muscle spasms. 09/29/20   Lavina Hamman, MD  traZODone (DESYREL) 100 MG tablet Take 100 mg by mouth at bedtime. 10/07/21   [provider]  Zinc Sulfate 220 (50 Zn) MG TABS Take 1 tablet (220 mg total) by mouth daily. Patient not taking: Reported on 12/28/2020 09/08/20   [place],  Lavon Paganini, PA-C      Allergies    Patient has no known allergies.    Review of Systems   Review of Systems  Musculoskeletal:  Positive for arthralgias.  All other systems reviewed and are negative.   Physical Exam Updated Vital Signs BP 134/86 (BP Location: Left Arm)   Pulse (!) 113   Temp 99.2 F (37.3 C) (Oral)   Resp (!) 23   SpO2 99%  Physical Exam Vitals and nursing note reviewed.  Constitutional:      Appearance: He is well-developed.  HENT:     Head: Normocephalic and atraumatic.  Eyes:     Conjunctiva/sclera: Conjunctivae normal.     Pupils: Pupils are equal, round, and reactive to light.  Cardiovascular:     Rate and Rhythm: Normal rate and regular rhythm.     Heart sounds: Normal heart sounds.  Pulmonary:     Effort: Pulmonary effort is normal.     Breath sounds: Normal breath sounds.  Abdominal:     General: Bowel sounds are normal.     Palpations: Abdomen is soft.  Musculoskeletal:        General: Normal range of motion.     Cervical back: Normal range of motion.     Comments: Left hand mildly swollen and warm to touch, there is no overlying erythema or induration, no findings of cellulitis, pain elicited with even minor movement, radial pulses intact  Skin:    General: Skin is warm and dry.  Neurological:     Mental Status: He is alert and oriented to person, place, and time.     ED Results / Procedures / Treatments   Labs (all labs ordered are listed, but only abnormal results are displayed) Labs Reviewed - No data to display  EKG None  Radiology No results found.  Procedures Procedures    Medications Ordered in ED Medications - No data to display  ED Course/ Medical Decision Making/ A&P                           Medical Decision Making Risk Prescription drug management.   56 year old male here with gout flare to left hand and right shoulder.  Has been on oxycodone and Medrol Dosepak, however minimal relief.  Reports indomethacin  usually works better, however most recent creatinine was 2.79 so unfortunately this is not a current option.  He does have some swelling of the left hand and warmth to touch but there is no erythema, induration, or findings of cellulitis.  He has not had any fevers.  Discussed options with patient including repeat trial of steroids and pain medication versus pain medication along--he would like to try both again.  He was advised to monitor his sugar closely at home as this can cause some hyperglycemia.  He will need to follow-up with  his primary care doctor.  Can return here for any new or acute changes.  Final Clinical Impression(s) / ED Diagnoses Final diagnoses:  Acute gout of left hand, unspecified cause    Rx / DC Orders ED Discharge Orders          Ordered    predniSONE (DELTASONE) 20 MG tablet        11/23/21 2215    oxyCODONE-acetaminophen (PERCOCET) 5-325 MG tablet  Every 4 hours PRN        11/23/21 2215              Larene Pickett, PA-C 11/23/21 2223    Drenda Freeze, MD 11/23/21 2244

## 2021-12-05 ENCOUNTER — Other Ambulatory Visit: Payer: Self-pay

## 2021-12-05 ENCOUNTER — Emergency Department (HOSPITAL_BASED_OUTPATIENT_CLINIC_OR_DEPARTMENT_OTHER)
Admission: EM | Admit: 2021-12-05 | Discharge: 2021-12-05 | Disposition: A | Discharge status: Home / Self Care | Payer: BC Managed Care – PPO | Attending: Emergency Medicine | Admitting: Emergency Medicine

## 2021-12-05 ENCOUNTER — Encounter (HOSPITAL_BASED_OUTPATIENT_CLINIC_OR_DEPARTMENT_OTHER): Payer: Self-pay | Admitting: Emergency Medicine

## 2021-12-05 DIAGNOSIS — Z794 Long term (current) use of insulin: Secondary | ICD-10-CM | POA: Insufficient documentation

## 2021-12-05 DIAGNOSIS — Z79899 Other long term (current) drug therapy: Secondary | ICD-10-CM | POA: Insufficient documentation

## 2021-12-05 DIAGNOSIS — M79641 Pain in right hand: Secondary | ICD-10-CM | POA: Diagnosis not present

## 2021-12-05 DIAGNOSIS — M109 Gout, unspecified: Secondary | ICD-10-CM | POA: Diagnosis not present

## 2021-12-05 DIAGNOSIS — M10039 Idiopathic gout, unspecified wrist: Secondary | ICD-10-CM | POA: Diagnosis not present

## 2021-12-05 MED ORDER — COLCHICINE 0.6 MG PO TABS: ORAL_TABLET | ORAL | 0 refills | Status: DC

## 2021-12-05 MED ORDER — PREDNISONE 20 MG PO TABS: ORAL_TABLET | ORAL | 0 refills | Status: DC

## 2021-12-05 MED ORDER — MORPHINE SULFATE 15 MG PO TABS: 7.5000 mg | ORAL_TABLET | ORAL | 0 refills | Status: DC | PRN

## 2021-12-05 MED ORDER — PREDNISONE 50 MG PO TABS
60.0000 mg | ORAL_TABLET | Freq: Every day | ORAL | Status: DC
  Administered 2021-12-05: 60 mg via ORAL
  Filled 2021-12-05: qty 1

## 2021-12-05 NOTE — ED Triage Notes (Signed)
Pt arrived POV. Pt caox4 c/o "gout flare up" stating pain and swelling has been worsening in the R hand and elbow x2 weeks. Pt states he has been unable to get prescription meds filled.

## 2021-12-05 NOTE — ED Notes (Signed)
Discharge paperwork given and verbally understood. 

## 2021-12-05 NOTE — ED Notes (Signed)
Prednisone 60mg  PO, given at facility for tonight's dose. MD approved.

## 2021-12-05 NOTE — ED Provider Notes (Signed)
[facility] EMERGENCY DEPT Provider Note   CSN: [id number] Arrival date & time: 12/05/21  1640     History  Chief Complaint  Patient presents with   Hand Pain    Patrick [name] is a 56 y.o. male.  56 yo M with a chief complaints of an acute gout flare.  Tells me that he has pain to his right wrist and right elbow.  Consistent with his prior gout.  No fevers or chills.  No trauma.  He was recently seen in the emergency department setting because he is having trouble getting an appointment with his PCP.  Is unable to get established on his chronic meds.   Hand Pain       Home Medications Prior to Admission medications   Medication Sig Start Date End Date Taking? Authorizing Provider  colchicine 0.6 MG tablet Take 2 tablets 1.2mg  po once, then an hour later take one tablet 0.6mg  12/05/21  Yes Deno Etienne, DO  morphine (MSIR) 15 MG tablet Take 0.5 tablets (7.5 mg total) by mouth every 4 (four) hours as needed for severe pain. 12/05/21  Yes Deno Etienne, DO  predniSONE (DELTASONE) 20 MG tablet 2 tabs po daily x 4 days 12/05/21  Yes Deno Etienne, DO  acetaminophen (TYLENOL) 500 MG tablet Take 1,000 mg by mouth every 6 (six) hours as needed for mild pain or headache.    [provider]  amLODipine (NORVASC) 5 MG tablet Take 5 mg by mouth daily. 08/18/21   [provider]  ascorbic acid (VITAMIN C) 1000 MG tablet Take 1 tablet (1,000 mg total) by mouth daily. Patient not taking: Reported on 11/08/2021 09/08/20   Angiulli, Lavon Paganini, PA-C  doxycycline (VIBRA-TABS) 100 MG tablet Take 100 mg by mouth 2 (two) times daily. 10/10/21   [provider]  furosemide (LASIX) 20 MG tablet Take 1 tablet (20 mg total) by mouth daily as needed. Patient not taking: Reported on 11/08/2021 11/03/20 11/03/21  [name], MD  gabapentin (NEURONTIN) 300 MG capsule Take 1 capsule (300 mg total) by mouth 3 (three) times daily. Patient taking differently: Take 300 mg by mouth 3  (three) times daily as needed (pain). 09/08/20   Angiulli, Lavon Paganini, PA-C  indomethacin (INDOCIN) 50 MG capsule Take 50 mg by mouth 3 (three) times daily as needed for moderate pain. 11/20/20   [provider]  insulin aspart (NOVOLOG FLEXPEN) 100 UNIT/ML FlexPen Inject 4 Units into the skin 3 (three) times daily with meals. 09/08/20   Angiulli, Lavon Paganini, PA-C  insulin glargine (LANTUS SOLOSTAR) 100 UNIT/ML Solostar Pen Inject 25 Units into the skin 2 (two) times daily. 09/08/20   Angiulli, Lavon Paganini, PA-C  Insulin Pen Needle 32G X 4 MM MISC Use as directed 09/08/20   [name], Clide Deutscher, MD  Insulin Pen Needle 32G X 4 MM MISC Use as directed 09/09/20   Angiulli, Lavon Paganini, PA-C  JARDIANCE 25 MG TABS tablet Take 25 mg by mouth daily. 09/28/21   [provider]  losartan-hydrochlorothiazide (HYZAAR) 100-25 MG tablet Take 1 tablet by mouth daily. 08/18/21   [provider]  methylPREDNISolone (MEDROL DOSEPAK) 4 MG TBPK tablet Take as directed on package 11/19/21   Horton, Barbette Hair, MD  metoprolol tartrate (LOPRESSOR) 25 MG tablet Take 0.5 tablets (12.5 mg total) by mouth 2 (two) times daily. 09/08/20   Angiulli, Lavon Paganini, PA-C  Multiple Vitamins-Minerals (CERTAVITE/ANTIOXIDANTS) TABS Take 1 tablet by mouth daily. Patient not taking: Reported on 12/28/2020  09/29/20   Lavina Hamman, MD  oxyCODONE-acetaminophen (PERCOCET) 5-325 MG tablet Take 1 tablet by mouth every 4 (four) hours as needed. 11/23/21   Larene Pickett, PA-C  OZEMPIC, 0.25 OR 0.5 MG/DOSE, 2 MG/3ML SOPN Inject 0.5 mg into the skin once a week. 10/07/21   [provider]  tiZANidine (ZANAFLEX) 4 MG tablet Take 1 tablet (4 mg total) by mouth 2 (two) times daily as needed for muscle spasms. 09/29/20   Lavina Hamman, MD  traZODone (DESYREL) 100 MG tablet Take 100 mg by mouth at bedtime. 10/07/21   [provider]  Zinc Sulfate 220 (50 Zn) MG TABS Take 1 tablet (220 mg total) by mouth daily. Patient not taking:  Reported on 12/28/2020 09/08/20   [place], Lavon Paganini, PA-C      Allergies    Patient has no known allergies.    Review of Systems   Review of Systems  Physical Exam Updated Vital Signs BP (!) 142/93 (BP Location: Left Arm)   Pulse 93   Temp 98.4 F (36.9 C) (Oral)   Resp 18   Ht 6\' 2"  (1.88 m)   Wt 133.8 kg   SpO2 100%   BMI 37.88 kg/m  Physical Exam Vitals and nursing note reviewed.  Constitutional:      Appearance: He is well-developed.  HENT:     Head: Normocephalic and atraumatic.  Eyes:     Pupils: Pupils are equal, round, and reactive to light.  Neck:     Vascular: No JVD.  Cardiovascular:     Rate and Rhythm: Normal rate and regular rhythm.     Heart sounds: No murmur heard.    No friction rub. No gallop.  Pulmonary:     Effort: No respiratory distress.     Breath sounds: No wheezing.  Abdominal:     General: There is no distension.     Tenderness: There is no abdominal tenderness. There is no guarding or rebound.  Musculoskeletal:        General: Normal range of motion.     Cervical back: Normal range of motion and neck supple.     Comments: Pain and swelling to both the right wrist and right elbow.  I am able to range without significant discomfort.  No obvious erythema or warmth.  Skin:    Coloration: Skin is not pale.     Findings: No rash.  Neurological:     Mental Status: He is alert and oriented to person, place, and time.  Psychiatric:        Behavior: Behavior normal.     ED Results / Procedures / Treatments   Labs (all labs ordered are listed, but only abnormal results are displayed) Labs Reviewed - No data to display  EKG None  Radiology No results found.  Procedures Procedures    Medications Ordered in ED Medications  predniSONE (DELTASONE) tablet 60 mg (has no administration in time range)    ED Course/ Medical Decision Making/ A&P                           Medical Decision Making Risk Prescription drug  management.   56 yo M with a chief complaints of an acute gout flare.  The patient has been struggling with this recently.  Feels like his gout, seems less likely to be septic arthritis based on history and exam.  He would like another prescription for prednisone.  We will  give 2 doses of colchicine as well.  Encouraged PCP follow-up.  5:53 PM:  I have discussed the diagnosis/risks/treatment options with the patient.  Evaluation and diagnostic testing in the emergency department does not suggest an emergent condition requiring admission or immediate intervention beyond what has been performed at this time.  They will follow up with PCP. We also discussed returning to the ED immediately if new or worsening sx occur. We discussed the sx which are most concerning (e.g., sudden worsening pain, fever, inability to tolerate by mouth) that necessitate immediate return. Medications administered to the patient during their visit and any new prescriptions provided to the patient are listed below.  Medications given during this visit Medications  predniSONE (DELTASONE) tablet 60 mg (has no administration in time range)     The patient appears reasonably screen and/or stabilized for discharge and I doubt any other medical condition or other [facility] requiring further screening, evaluation, or treatment in the ED at this time prior to discharge.          Final Clinical Impression(s) / ED Diagnoses Final diagnoses:  Acute gout of right wrist, unspecified cause    Rx / DC Orders ED Discharge Orders          Ordered    morphine (MSIR) 15 MG tablet  Every 4 hours PRN        12/05/21 1749    predniSONE (DELTASONE) 20 MG tablet        12/05/21 1749    colchicine 0.6 MG tablet        12/05/21 Huey, Vincen Bejar, DO 12/05/21 1754

## 2021-12-16 ENCOUNTER — Other Ambulatory Visit: Payer: Self-pay

## 2021-12-16 ENCOUNTER — Emergency Department (HOSPITAL_BASED_OUTPATIENT_CLINIC_OR_DEPARTMENT_OTHER)
Admission: EM | Admit: 2021-12-16 | Discharge: 2021-12-16 | Disposition: A | Discharge status: Home / Self Care | Payer: BC Managed Care – PPO | Attending: Emergency Medicine | Admitting: Emergency Medicine

## 2021-12-16 ENCOUNTER — Encounter (HOSPITAL_BASED_OUTPATIENT_CLINIC_OR_DEPARTMENT_OTHER): Payer: Self-pay | Admitting: Emergency Medicine

## 2021-12-16 DIAGNOSIS — I10 Essential (primary) hypertension: Secondary | ICD-10-CM | POA: Insufficient documentation

## 2021-12-16 DIAGNOSIS — Z79899 Other long term (current) drug therapy: Secondary | ICD-10-CM | POA: Diagnosis not present

## 2021-12-16 DIAGNOSIS — Z794 Long term (current) use of insulin: Secondary | ICD-10-CM | POA: Insufficient documentation

## 2021-12-16 DIAGNOSIS — M79641 Pain in right hand: Secondary | ICD-10-CM | POA: Insufficient documentation

## 2021-12-16 DIAGNOSIS — E109 Type 1 diabetes mellitus without complications: Secondary | ICD-10-CM | POA: Insufficient documentation

## 2021-12-16 DIAGNOSIS — M109 Gout, unspecified: Secondary | ICD-10-CM | POA: Diagnosis not present

## 2021-12-16 DIAGNOSIS — M10041 Idiopathic gout, right hand: Secondary | ICD-10-CM | POA: Diagnosis not present

## 2021-12-16 MED ORDER — OXYCODONE-ACETAMINOPHEN 5-325 MG PO TABS
2.0000 | ORAL_TABLET | Freq: Once | ORAL | Status: AC
  Administered 2021-12-16: 2 via ORAL
  Filled 2021-12-16: qty 2

## 2021-12-16 MED ORDER — ALLOPURINOL 100 MG PO TABS: 100.0000 mg | ORAL_TABLET | Freq: Two times a day (BID) | ORAL | 0 refills | Status: DC

## 2021-12-16 MED ORDER — COLCHICINE 0.6 MG PO TABS: ORAL_TABLET | ORAL | 2 refills | Status: DC

## 2021-12-16 MED ORDER — OXYCODONE-ACETAMINOPHEN 5-325 MG PO TABS: 2.0000 | ORAL_TABLET | ORAL | 0 refills | Status: DC | PRN

## 2021-12-16 NOTE — ED Triage Notes (Signed)
Arrives via POV for R hand swelling that has been present intermittently for weeks.  H/o gout. Does not have a prescription for colchicine but has been helped by this in the past.   Denies fever

## 2021-12-16 NOTE — Discharge Instructions (Signed)
Begin taking allopurinol and colchicine as prescribed.  Follow-up with primary doctor if symptoms persist, and return to the ER symptoms significantly worsen or change.

## 2021-12-16 NOTE — ED Notes (Signed)
Taxi called for patient.

## 2021-12-16 NOTE — ED Provider Notes (Signed)
[facility] EMERGENCY DEPT Provider Note   CSN: [id number] Arrival date & time: 12/16/21  0142     History  Chief Complaint  Patient presents with   Hand Swelling    Patrick [name] is a 56 y.o. male.  Patient is a 56 year old male with past medical history of type 1 diabetes status post bilateral above-the-knee amputations, hypertension, chronic renal insufficiency, gout.  Patient presenting today with complaints of right hand pain.  This has been worsening over the past several days, but flaring up off and on for the past month.  He has history of gout and this feels the same.  He denies any injury or trauma.  He denies any fevers or chills.  The history is provided by the patient.       Home Medications Prior to Admission medications   Medication Sig Start Date End Date Taking? Authorizing Provider  acetaminophen (TYLENOL) 500 MG tablet Take 1,000 mg by mouth every 6 (six) hours as needed for mild pain or headache.    [provider]  amLODipine (NORVASC) 5 MG tablet Take 5 mg by mouth daily. 08/18/21   [provider]  ascorbic acid (VITAMIN C) 1000 MG tablet Take 1 tablet (1,000 mg total) by mouth daily. Patient not taking: Reported on 11/08/2021 09/08/20   Angiulli, Lavon Paganini, PA-C  colchicine 0.6 MG tablet Take 2 tablets 1.2mg  po once, then an hour later take one tablet 0.6mg  12/05/21   Deno Etienne, DO  doxycycline (VIBRA-TABS) 100 MG tablet Take 100 mg by mouth 2 (two) times daily. 10/10/21   [provider]  furosemide (LASIX) 20 MG tablet Take 1 tablet (20 mg total) by mouth daily as needed. Patient not taking: Reported on 11/08/2021 11/03/20 11/03/21  [name], MD  gabapentin (NEURONTIN) 300 MG capsule Take 1 capsule (300 mg total) by mouth 3 (three) times daily. Patient taking differently: Take 300 mg by mouth 3 (three) times daily as needed (pain). 09/08/20   Angiulli, Lavon Paganini, PA-C  indomethacin (INDOCIN) 50 MG capsule Take 50 mg  by mouth 3 (three) times daily as needed for moderate pain. 11/20/20   [provider]  insulin aspart (NOVOLOG FLEXPEN) 100 UNIT/ML FlexPen Inject 4 Units into the skin 3 (three) times daily with meals. 09/08/20   Angiulli, Lavon Paganini, PA-C  insulin glargine (LANTUS SOLOSTAR) 100 UNIT/ML Solostar Pen Inject 25 Units into the skin 2 (two) times daily. 09/08/20   Angiulli, Lavon Paganini, PA-C  Insulin Pen Needle 32G X 4 MM MISC Use as directed 09/08/20   [name], Clide Deutscher, MD  Insulin Pen Needle 32G X 4 MM MISC Use as directed 09/09/20   Angiulli, Lavon Paganini, PA-C  JARDIANCE 25 MG TABS tablet Take 25 mg by mouth daily. 09/28/21   [provider]  losartan-hydrochlorothiazide (HYZAAR) 100-25 MG tablet Take 1 tablet by mouth daily. 08/18/21   [provider]  methylPREDNISolone (MEDROL DOSEPAK) 4 MG TBPK tablet Take as directed on package 11/19/21   Horton, Barbette Hair, MD  metoprolol tartrate (LOPRESSOR) 25 MG tablet Take 0.5 tablets (12.5 mg total) by mouth 2 (two) times daily. 09/08/20   Angiulli, Lavon Paganini, PA-C  morphine (MSIR) 15 MG tablet Take 0.5 tablets (7.5 mg total) by mouth every 4 (four) hours as needed for severe pain. 12/05/21   Deno Etienne, DO  Multiple Vitamins-Minerals (CERTAVITE/ANTIOXIDANTS) TABS Take 1 tablet by mouth daily. Patient not taking: Reported on 12/28/2020 09/29/20   Lavina Hamman, MD  oxyCODONE-acetaminophen (PERCOCET) 5-325 MG tablet Take 1 tablet by mouth every 4 (four) hours as needed. 11/23/21   Larene Pickett, PA-C  OZEMPIC, 0.25 OR 0.5 MG/DOSE, 2 MG/3ML SOPN Inject 0.5 mg into the skin once a week. 10/07/21   [provider]  predniSONE (DELTASONE) 20 MG tablet 2 tabs po daily x 4 days 12/05/21   Deno Etienne, DO  tiZANidine (ZANAFLEX) 4 MG tablet Take 1 tablet (4 mg total) by mouth 2 (two) times daily as needed for muscle spasms. 09/29/20   Lavina Hamman, MD  traZODone (DESYREL) 100 MG tablet Take 100 mg by mouth at bedtime. 10/07/21   [provider]  Zinc Sulfate 220 (50 Zn) MG TABS Take 1 tablet (220 mg total) by mouth daily. Patient not taking: Reported on 12/28/2020 09/08/20   [place], Lavon Paganini, PA-C      Allergies    Patient has no known allergies.    Review of Systems   Review of Systems  All other systems reviewed and are negative.   Physical Exam Updated Vital Signs BP (!) 150/117 (BP Location: Left Arm)   Pulse 98   Temp 98.4 F (36.9 C) (Oral)   Resp 19   Wt 133 kg   SpO2 100%   BMI 37.65 kg/m  Physical Exam Vitals and nursing note reviewed.  Constitutional:      General: He is not in acute distress.    Appearance: Normal appearance. He is not ill-appearing.  HENT:     Head: Normocephalic and atraumatic.  Pulmonary:     Effort: Pulmonary effort is normal.  Musculoskeletal:     Comments: There is swelling noted throughout the entire right hand.  He has pain with range of motion of the fingers, but most notably at the first MCP joint.  Skin:    General: Skin is warm and dry.  Neurological:     Mental Status: He is alert.     ED Results / Procedures / Treatments   Labs (all labs ordered are listed, but only abnormal results are displayed) Labs Reviewed - No data to display  EKG None  Radiology No results found.  Procedures Procedures    Medications Ordered in ED Medications  oxyCODONE-acetaminophen (PERCOCET/ROXICET) 5-325 MG per tablet 2 tablet (has no administration in time range)    ED Course/ Medical Decision Making/ A&P  Patient presenting with right hand pain and swelling.  He has history of gout and this feels like a flareup.  Patient to be treated with colchicine and allopurinol.  He is to follow-up/return as needed.  Final Clinical Impression(s) / ED Diagnoses Final diagnoses:  None    Rx / DC Orders ED Discharge Orders     None         Veryl Speak, MD 12/16/21 [phone]

## 2021-12-22 DIAGNOSIS — N184 Chronic kidney disease, stage 4 (severe): Secondary | ICD-10-CM | POA: Diagnosis not present

## 2021-12-22 DIAGNOSIS — E1122 Type 2 diabetes mellitus with diabetic chronic kidney disease: Secondary | ICD-10-CM | POA: Diagnosis not present

## 2021-12-22 DIAGNOSIS — G629 Polyneuropathy, unspecified: Secondary | ICD-10-CM | POA: Diagnosis not present

## 2021-12-22 DIAGNOSIS — I129 Hypertensive chronic kidney disease with stage 1 through stage 4 chronic kidney disease, or unspecified chronic kidney disease: Secondary | ICD-10-CM | POA: Diagnosis not present

## 2021-12-28 ENCOUNTER — Other Ambulatory Visit: Payer: Self-pay

## 2021-12-28 DIAGNOSIS — Z7952 Long term (current) use of systemic steroids: Secondary | ICD-10-CM | POA: Diagnosis not present

## 2021-12-28 DIAGNOSIS — Z1152 Encounter for screening for COVID-19: Secondary | ICD-10-CM | POA: Diagnosis not present

## 2021-12-28 DIAGNOSIS — Z794 Long term (current) use of insulin: Secondary | ICD-10-CM | POA: Diagnosis not present

## 2021-12-28 DIAGNOSIS — E1122 Type 2 diabetes mellitus with diabetic chronic kidney disease: Secondary | ICD-10-CM | POA: Insufficient documentation

## 2021-12-28 DIAGNOSIS — N189 Chronic kidney disease, unspecified: Secondary | ICD-10-CM | POA: Insufficient documentation

## 2021-12-28 DIAGNOSIS — M25522 Pain in left elbow: Secondary | ICD-10-CM | POA: Insufficient documentation

## 2021-12-28 DIAGNOSIS — Z7984 Long term (current) use of oral hypoglycemic drugs: Secondary | ICD-10-CM | POA: Insufficient documentation

## 2021-12-28 DIAGNOSIS — M109 Gout, unspecified: Secondary | ICD-10-CM | POA: Insufficient documentation

## 2021-12-28 DIAGNOSIS — R509 Fever, unspecified: Secondary | ICD-10-CM | POA: Diagnosis not present

## 2021-12-28 DIAGNOSIS — D72829 Elevated white blood cell count, unspecified: Secondary | ICD-10-CM | POA: Diagnosis not present

## 2021-12-28 DIAGNOSIS — M7989 Other specified soft tissue disorders: Secondary | ICD-10-CM | POA: Diagnosis not present

## 2021-12-28 DIAGNOSIS — M25422 Effusion, left elbow: Secondary | ICD-10-CM | POA: Diagnosis not present

## 2021-12-28 NOTE — ED Triage Notes (Signed)
Pt c/o left wrist +elbow with right shoulder + hand pain. Pt states pain has been ongoing for past few days worsening tonight. Pt states pain similar to gout flare ups in the past. Has been taking allopurinol without relief.

## 2021-12-29 ENCOUNTER — Other Ambulatory Visit (HOSPITAL_BASED_OUTPATIENT_CLINIC_OR_DEPARTMENT_OTHER): Payer: BC Managed Care – PPO

## 2021-12-29 ENCOUNTER — Emergency Department (HOSPITAL_COMMUNITY): Payer: BC Managed Care – PPO

## 2021-12-29 ENCOUNTER — Emergency Department (HOSPITAL_BASED_OUTPATIENT_CLINIC_OR_DEPARTMENT_OTHER): Payer: BC Managed Care – PPO | Admitting: Radiology

## 2021-12-29 ENCOUNTER — Emergency Department (HOSPITAL_BASED_OUTPATIENT_CLINIC_OR_DEPARTMENT_OTHER)
Admission: EM | Admit: 2021-12-29 | Discharge: 2021-12-29 | Disposition: A | Discharge status: Home / Self Care | Payer: BC Managed Care – PPO | Attending: Emergency Medicine | Admitting: Emergency Medicine

## 2021-12-29 DIAGNOSIS — Z794 Long term (current) use of insulin: Secondary | ICD-10-CM | POA: Diagnosis not present

## 2021-12-29 DIAGNOSIS — M109 Gout, unspecified: Secondary | ICD-10-CM

## 2021-12-29 DIAGNOSIS — N189 Chronic kidney disease, unspecified: Secondary | ICD-10-CM | POA: Diagnosis not present

## 2021-12-29 DIAGNOSIS — M7989 Other specified soft tissue disorders: Secondary | ICD-10-CM | POA: Diagnosis not present

## 2021-12-29 DIAGNOSIS — M25529 Pain in unspecified elbow: Secondary | ICD-10-CM

## 2021-12-29 DIAGNOSIS — E1122 Type 2 diabetes mellitus with diabetic chronic kidney disease: Secondary | ICD-10-CM | POA: Diagnosis not present

## 2021-12-29 DIAGNOSIS — Z7984 Long term (current) use of oral hypoglycemic drugs: Secondary | ICD-10-CM | POA: Diagnosis not present

## 2021-12-29 DIAGNOSIS — D72829 Elevated white blood cell count, unspecified: Secondary | ICD-10-CM | POA: Diagnosis not present

## 2021-12-29 DIAGNOSIS — R509 Fever, unspecified: Secondary | ICD-10-CM

## 2021-12-29 DIAGNOSIS — M25522 Pain in left elbow: Secondary | ICD-10-CM | POA: Diagnosis not present

## 2021-12-29 DIAGNOSIS — Z7952 Long term (current) use of systemic steroids: Secondary | ICD-10-CM | POA: Diagnosis not present

## 2021-12-29 DIAGNOSIS — M25422 Effusion, left elbow: Secondary | ICD-10-CM | POA: Diagnosis not present

## 2021-12-29 DIAGNOSIS — Z1152 Encounter for screening for COVID-19: Secondary | ICD-10-CM | POA: Diagnosis not present

## 2021-12-29 LAB — CBC WITH DIFFERENTIAL/PLATELET
Abs Immature Granulocytes: 0.06 10*3/uL (ref 0.00–0.07)
Basophils Absolute: 0 10*3/uL (ref 0.0–0.1)
Basophils Relative: 0 %
Eosinophils Absolute: 0.1 10*3/uL (ref 0.0–0.5)
Eosinophils Relative: 1 %
HCT: 27.1 % — ABNORMAL LOW (ref 39.0–52.0)
Hemoglobin: 8.4 g/dL — ABNORMAL LOW (ref 13.0–17.0)
Immature Granulocytes: 1 %
Lymphocytes Relative: 17 %
Lymphs Abs: 2 10*3/uL (ref 0.7–4.0)
MCH: 27 pg (ref 26.0–34.0)
MCHC: 31 g/dL (ref 30.0–36.0)
MCV: 87.1 fL (ref 80.0–100.0)
Monocytes Absolute: 0.9 10*3/uL (ref 0.1–1.0)
Monocytes Relative: 8 %
Neutro Abs: 8.5 10*3/uL — ABNORMAL HIGH (ref 1.7–7.7)
Neutrophils Relative %: 73 %
Platelets: 371 10*3/uL (ref 150–400)
RBC: 3.11 MIL/uL — ABNORMAL LOW (ref 4.22–5.81)
RDW: 14.6 % (ref 11.5–15.5)
WBC: 11.7 10*3/uL — ABNORMAL HIGH (ref 4.0–10.5)
nRBC: 0 % (ref 0.0–0.2)

## 2021-12-29 LAB — BASIC METABOLIC PANEL
Anion gap: 12 (ref 5–15)
BUN: 39 mg/dL — ABNORMAL HIGH (ref 6–20)
CO2: 18 mmol/L — ABNORMAL LOW (ref 22–32)
Calcium: 7.3 mg/dL — ABNORMAL LOW (ref 8.9–10.3)
Chloride: 105 mmol/L (ref 98–111)
Creatinine, Ser: 2.98 mg/dL — ABNORMAL HIGH (ref 0.61–1.24)
GFR, Estimated: 24 mL/min — ABNORMAL LOW (ref >60)
Glucose, Bld: 236 mg/dL — ABNORMAL HIGH (ref 70–99)
Potassium: 4.6 mmol/L (ref 3.5–5.1)
Sodium: 135 mmol/L (ref 135–145)

## 2021-12-29 LAB — SEDIMENTATION RATE: Sed Rate: 86 mm/hr — ABNORMAL HIGH (ref 0–16)

## 2021-12-29 LAB — SYNOVIAL CELL COUNT + DIFF, W/ CRYSTALS
Crystals, Fluid: NONE SEEN
Eosinophils-Synovial: 1 % (ref 0–1)
Lymphocytes-Synovial Fld: 21 % — ABNORMAL HIGH (ref 0–20)
Monocyte-Macrophage-Synovial Fluid: 3 % — ABNORMAL LOW (ref 50–90)
Neutrophil, Synovial: 75 % — ABNORMAL HIGH (ref 0–25)
WBC, Synovial: UNDETERMINED /mm3 (ref 0–200)

## 2021-12-29 LAB — RESP PANEL BY RT-PCR (FLU A&B, COVID) ARPGX2
Influenza A by PCR: NEGATIVE
Influenza B by PCR: NEGATIVE
SARS Coronavirus 2 by RT PCR: NEGATIVE

## 2021-12-29 LAB — C-REACTIVE PROTEIN: CRP: 10.3 mg/dL — ABNORMAL HIGH (ref <1.0)

## 2021-12-29 LAB — LACTIC ACID, PLASMA
Lactic Acid, Venous: 1 mmol/L (ref 0.5–1.9)
Lactic Acid, Venous: 0.8 mmol/L (ref 0.5–1.9)

## 2021-12-29 MED ORDER — HYDROMORPHONE HCL 1 MG/ML IJ SOLN
1.0000 mg | Freq: Once | INTRAMUSCULAR | Status: AC
  Administered 2021-12-29: 1 mg via INTRAVENOUS
  Filled 2021-12-29: qty 1

## 2021-12-29 MED ORDER — METHYLPREDNISOLONE SODIUM SUCC 125 MG IJ SOLR
125.0000 mg | Freq: Once | INTRAMUSCULAR | Status: AC
  Administered 2021-12-29: 125 mg via INTRAVENOUS
  Filled 2021-12-29: qty 2

## 2021-12-29 MED ORDER — LACTATED RINGERS IV BOLUS
1000.0000 mL | Freq: Once | INTRAVENOUS | Status: AC
  Administered 2021-12-29: 1000 mL via INTRAVENOUS

## 2021-12-29 MED ORDER — ACETAMINOPHEN 500 MG PO TABS
1000.0000 mg | ORAL_TABLET | Freq: Once | ORAL | Status: AC
  Administered 2021-12-29: 1000 mg via ORAL
  Filled 2021-12-29: qty 2

## 2021-12-29 MED ORDER — LIDOCAINE-EPINEPHRINE 2 %-1:100000 IJ SOLN
10.0000 mL | Freq: Once | INTRAMUSCULAR | Status: AC
  Administered 2021-12-29: 10 mL via INTRADERMAL
  Filled 2021-12-29: qty 10.2

## 2021-12-29 MED ORDER — OXYCODONE-ACETAMINOPHEN 5-325 MG PO TABS: 1.0000 | ORAL_TABLET | Freq: Four times a day (QID) | ORAL | 0 refills | Status: DC | PRN

## 2021-12-29 MED ORDER — PREDNISONE 20 MG PO TABS: 40.0000 mg | ORAL_TABLET | Freq: Every day | ORAL | 0 refills | Status: DC

## 2021-12-29 MED ORDER — LIDOCAINE-EPINEPHRINE (PF) 2 %-1:200000 IJ SOLN
INTRAMUSCULAR | Status: AC
  Filled 2021-12-29: qty 20

## 2021-12-29 NOTE — ED Notes (Signed)
[facility]ian[facility] at [facility] is sending transport. ABB (NS) 7:25a

## 2021-12-29 NOTE — ED Notes (Signed)
Pt given food and beverage 

## 2021-12-29 NOTE — Discharge Instructions (Addendum)
Take the prednisone and pain meds.  Use the sling for comfort.  If you start having high fever, drainage from your arm call Dr. Forbes Cellar office for sooner appointment.  Tell them you were in the ER and need to have a follow up

## 2021-12-29 NOTE — ED Notes (Signed)
Patient verbalizes understanding of discharge instructions. Opportunity for questioning and answers were provided. Pt discharged from ED. 

## 2021-12-29 NOTE — ED Notes (Signed)
Pt ambulatory to and from restroom with steady gait 

## 2021-12-29 NOTE — ED Notes (Addendum)
Called [facility] for consult per EDP at 601am Returned call at [facility]

## 2021-12-29 NOTE — Consult Note (Signed)
Reason for Consult:Left elbow pain Referring Physician: Blanchie Dessert Time called: [id number] Time at bedside: Patrick [name] is an 56 y.o. male.  HPI: Patrick [name] comes in with a chronic hx/o joint pain 2/2 gout. His left elbow has been hurting significantly for several weeks. His usual gout medications have been minimally effective and he was even out for a time. He denies fevers, chills, sweats, N/V. He is RHD and works for [employer] from home.  Past Medical History:  Diagnosis Date   Anemia    Diabetes mellitus    Gout    History of COVID-19 04/2019   Hypertension    PVD (peripheral vascular disease) ([place])    s/p R BKA    Past Surgical History:  Procedure Laterality Date   AMPUTATION Right 06/06/2020   Procedure: AMPUTATION 5TH RAY;  Surgeon: [name], MD;  Location: [place];  Service: Orthopedics;  Laterality: Right;   AMPUTATION Right 06/08/2020   Procedure: RIGHT 4TH RAY AMPUTATION;  Surgeon: [name], MD;  Location: [place];  Service: Orthopedics;  Laterality: Right;   AMPUTATION Right 08/27/2020   Procedure: AMPUTATION BELOW KNEE;  Surgeon: Jessy Oto, MD;  Location: [place];  Service: Orthopedics;  Laterality: Right;   AMPUTATION Left 12/30/2020   Procedure: LEFT BELOW KNEE AMPUTATION;  Surgeon: [name], MD;  Location: [place];  Service: Orthopedics;  Laterality: Left;   CHOLECYSTECTOMY N/A 09/25/2020   Procedure: LAPAROSCOPIC CHOLECYSTECTOMY;  Surgeon: Jesusita Oka, MD;  Location: [facility];  Service: General;  Laterality: N/A;    Family History  Problem Relation Age of Onset   Diabetes Neg Hx     Social History:  reports that he has never smoked. He has never used smokeless tobacco. He reports that he does not drink alcohol and does not use drugs.  Allergies: No Known Allergies  Medications: I have reviewed the patient's current medications.  Results for orders placed or performed during the hospital encounter of 12/29/21 (from the past 48 hour(s))  CBC with  Differential     Status: Abnormal   Collection Time: 12/28/21 11:52 PM  Result Value Ref Range   WBC 11.7 (H) 4.0 - 10.5 K/uL   RBC 3.11 (L) 4.22 - 5.81 MIL/uL   Hemoglobin 8.4 (L) 13.0 - 17.0 g/dL   HCT 27.1 (L) 39.0 - 52.0 %   MCV 87.1 80.0 - 100.0 fL   MCH 27.0 26.0 - 34.0 pg   MCHC 31.0 30.0 - 36.0 g/dL   RDW 14.6 11.5 - 15.5 %   Platelets 371 150 - 400 K/uL   nRBC 0.0 0.0 - 0.2 %   Neutrophils Relative % 73 %   Neutro Abs 8.5 (H) 1.7 - 7.7 K/uL   Lymphocytes Relative 17 %   Lymphs Abs 2.0 0.7 - 4.0 K/uL   Monocytes Relative 8 %   Monocytes Absolute 0.9 0.1 - 1.0 K/uL   Eosinophils Relative 1 %   Eosinophils Absolute 0.1 0.0 - 0.5 K/uL   Basophils Relative 0 %   Basophils Absolute 0.0 0.0 - 0.1 K/uL   Immature Granulocytes 1 %   Abs Immature Granulocytes 0.06 0.00 - 0.07 K/uL    Comment: Performed at [facility], [address] [place], [place] [address]  Basic metabolic panel     Status: Abnormal   Collection Time: 12/28/21 11:52 PM  Result Value Ref Range   Sodium 135 135 - 145 mmol/L   Potassium 4.6 3.5 -  5.1 mmol/L   Chloride 105 98 - 111 mmol/L   CO2 18 (L) 22 - 32 mmol/L   Glucose, Bld 236 (H) 70 - 99 mg/dL    Comment: Glucose reference range applies only to samples taken after fasting for at least 8 hours.   BUN 39 (H) 6 - 20 mg/dL   Creatinine, Ser 2.98 (H) 0.61 - 1.24 mg/dL   Calcium 7.3 (L) 8.9 - 10.3 mg/dL   GFR, Estimated 24 (L) >60 mL/min    Comment: (NOTE) Calculated using the CKD-EPI Creatinine Equation (2021)    Anion gap 12 5 - 15    Comment: Performed at [facility], [place], [place] [address]  Synovial cell count + diff, w/ crystals     Status: Abnormal   Collection Time: 12/29/21  1:44 AM  Result Value Ref Range   Color, Synovial RED (A) YELLOW   Appearance-Synovial HAZY (A) CLEAR   Crystals, Fluid NO CRYSTALS SEEN    WBC, Synovial UNABLE TO PERFORM COUNT DUE TO CLOT IN SPECIMEN 0 -  200 /cu mm   Neutrophil, Synovial 75 (H) 0 - 25 %   Lymphocytes-Synovial Fld 21 (H) 0 - 20 %   Monocyte-Macrophage-Synovial Fluid 3 (L) 50 - 90 %   Eosinophils-Synovial 1 0 - 1 %    Comment: Performed at [facility], [place] [address], [place], [place] [address]  Lactic acid, plasma     Status: None   Collection Time: 12/29/21  2:00 AM  Result Value Ref Range   Lactic Acid, Venous 1.0 0.5 - 1.9 mmol/L    Comment: Performed at [facility], [address], [place], [place] [address]  Resp Panel by RT-PCR (Flu A&B, Covid) Anterior Nasal Swab     Status: None   Collection Time: 12/29/21  2:04 AM   Specimen: Anterior Nasal Swab  Result Value Ref Range   SARS Coronavirus 2 by RT PCR NEGATIVE NEGATIVE    Comment: (NOTE) SARS-CoV-2 target nucleic acids are NOT DETECTED.  The SARS-CoV-2 RNA is generally detectable in upper respiratory specimens during the acute phase of infection. The lowest concentration of SARS-CoV-2 viral copies this assay can detect is 138 copies/mL. A negative result does not preclude SARS-Cov-2 infection and should not be used as the sole basis for treatment or other patient management decisions. A negative result may occur with  improper specimen collection/handling, submission of specimen other than nasopharyngeal swab, presence of viral mutation(s) within the areas targeted by this assay, and inadequate number of viral copies(<138 copies/mL). A negative result must be combined with clinical observations, patient history, and epidemiological information. The expected result is Negative.  Fact Sheet for Patients:  EntrepreneurPulse.com.au  Fact Sheet for Healthcare Providers:  IncredibleEmployment.be  This test is no t yet approved or cleared by the [place] FDA and  has been authorized for detection and/or diagnosis of SARS-CoV-2 by FDA under an Emergency Use Authorization (EUA). This EUA will  remain  in effect (meaning this test can be used) for the duration of the COVID-19 declaration under Section 564(b)(1) of the Act, 21 U.S.C.section 360bbb-3(b)(1), unless the authorization is terminated  or revoked sooner.       Influenza A by PCR NEGATIVE NEGATIVE   Influenza B by PCR NEGATIVE NEGATIVE    Comment: (NOTE) The Xpert Xpress SARS-CoV-2/FLU/RSV plus assay is intended as an aid in the diagnosis of influenza from Nasopharyngeal swab specimens and should not be used as a sole  basis for treatment. Nasal washings and aspirates are unacceptable for Xpert Xpress SARS-CoV-2/FLU/RSV testing.  Fact Sheet for Patients: EntrepreneurPulse.com.au  Fact Sheet for Healthcare Providers: IncredibleEmployment.be  This test is not yet approved or cleared by the [place] FDA and has been authorized for detection and/or diagnosis of SARS-CoV-2 by FDA under an Emergency Use Authorization (EUA). This EUA will remain in effect (meaning this test can be used) for the duration of the COVID-19 declaration under Section 564(b)(1) of the Act, 21 U.S.C. section 360bbb-3(b)(1), unless the authorization is terminated or revoked.  Performed at [facility], [address], [place],  [address]     DG Elbow 2 Views Left  Result Date: 12/29/2021 CLINICAL DATA:  Left wrist and elbow pain EXAM: LEFT ELBOW - 2 VIEW COMPARISON:  None Available. FINDINGS: Best possible images due to limited range of motion. Limited projections compromises sensitivity. Noting this there is no definite fracture. No dislocation. Soft tissue swelling about the elbow. IMPRESSION: No definite fracture.  Soft tissue swelling about the elbow. Electronically Signed   By: Placido Sou M.D.   On: 12/29/2021 01:30    Review of Systems  HENT:  Negative for ear discharge, ear pain, hearing loss and tinnitus.   Eyes:  Negative for photophobia and pain.   Respiratory:  Negative for cough and shortness of breath.   Cardiovascular:  Negative for chest pain.  Gastrointestinal:  Negative for abdominal pain, nausea and vomiting.  Genitourinary:  Negative for dysuria, flank pain, frequency and urgency.  Musculoskeletal:  Positive for arthralgias (BUE, L>R) and joint swelling (Right hand). Negative for back pain, myalgias and neck pain.  Neurological:  Negative for dizziness and headaches.  Hematological:  Does not bruise/bleed easily.  Psychiatric/Behavioral:  The patient is not nervous/anxious.    Blood pressure (!) 154/94, pulse 87, temperature 98.4 F (36.9 C), temperature source Oral, resp. rate 16, height 6\' 2"  (1.88 m), weight 133 kg, SpO2 98 %. Physical Exam Constitutional:      General: He is not in acute distress.    Appearance: He is well-developed. He is not diaphoretic.  HENT:     Head: Normocephalic and atraumatic.  Eyes:     General: No scleral icterus.       Right eye: No discharge.        Left eye: No discharge.     Conjunctiva/sclera: Conjunctivae normal.  Cardiovascular:     Rate and Rhythm: Normal rate and regular rhythm.  Pulmonary:     Effort: Pulmonary effort is normal. No respiratory distress.  Musculoskeletal:     Cervical back: Normal range of motion.     Comments: Left shoulder, elbow, wrist, digits- no skin wounds, mild elbow TTP, AROM ~45, PROM ~60, no instability, no blocks to motion  Sens  Ax/R/M/U intact  Mot   Ax/ R/ PIN/ M/ AIN/ U intact  Rad 2+  Skin:    General: Skin is warm and dry.  Neurological:     Mental Status: He is alert.  Psychiatric:        Mood and Affect: Mood normal.        Behavior: Behavior normal.     Assessment/Plan: Left elbow pain -- Almost certainly gout. Plan CT then would recommend high dose oral Prednisone (40-60mg  daily). Can use sling for comfort.    Lisette Abu, PA-C Orthopedic Surgery [phone] 12/29/2021, 9:55 AM

## 2021-12-29 NOTE — ED Notes (Signed)
XR at bedside

## 2021-12-29 NOTE — ED Notes (Signed)
Pt  BIB [facility] from [facility]; c/o intermittent pain to left wrist to elbow ; febrile on arrival to [facility], concern for septic joint; pt unable to complete MRI due to pain and limited ROM; sent to [facility] for further evaluation with ortho; pt hypertensive, 181/105, all other VSS; pain 7/10, given dilaudid PTA

## 2021-12-29 NOTE — ED Notes (Addendum)
Called [facility] for transportation to [facility] ED per EDP for follow up with Ortho. Dr Roxanne Mins Accepting.

## 2021-12-29 NOTE — ED Provider Notes (Signed)
Patient was seen by orthopedic surgery.  At this time they suspect that patient's symptoms are all coming from gout.  They offered patient injection but he declines this.  At this time they want to send him home with prednisone and close follow-up in the office.  Discussed with the patient and he is comfortable with this plan.  He is given prednisone and pain control.   Blanchie Dessert, MD 12/29/21 1323

## 2021-12-29 NOTE — Progress Notes (Signed)
Orthopedic Tech Progress Note Patient Details:  Patrick [name] 19-Dec-1965 [id number]  Ortho Devices Type of Ortho Device: Sling immobilizer Ortho Device/Splint Interventions: Ordered, Application, Adjustment   Post Interventions Patient Tolerated: Well Instructions Provided: Adjustment of device, Care of device  Patrick [name] 12/29/2021, 2:37 PM

## 2021-12-29 NOTE — ED Provider Notes (Signed)
  Physical Exam  BP (!) 161/109   Pulse 80   Temp 98.8 F (37.1 C) (Oral)   Resp 20   Ht 6\' 2"  (1.88 m)   Wt 133 kg   SpO2 97%   BMI 37.65 kg/m   Physical Exam  Procedures  Procedures  ED Course / MDM    Medical Decision Making Amount and/or Complexity of Data Reviewed Labs: ordered. Radiology: ordered.  Risk OTC drugs. Prescription drug management.    Pt was unable to hold his arm about his head as requested by MRI. Upon his return, transfer team is at the bedside.  Patient initially declined being transferred, but has now agreed.  Plan is for patient to be transferred to [facility] ED. Dr. Dean/Dr. Jess Barters orthopedic surgery team needs to be consulted once patient arrives. They can assess the patient and decide if he needs arthrocentesis versus MRI and dispo will be pending based on orthopedic findings and recommendation. Patient had a fever, has history of diabetes, PVD.          Varney Biles, MD 12/29/21 [phone]

## 2021-12-29 NOTE — ED Notes (Signed)
Pt d/c'd with [facility]. MD spoke with patient prior to leaving. Report given to ED charge, Jerene Pitch, at [facility].

## 2021-12-29 NOTE — ED Notes (Signed)
Sling applied by ortho

## 2021-12-29 NOTE — ED Notes (Signed)
Ortho paged. 

## 2021-12-29 NOTE — ED Provider Notes (Signed)
[facility] EMERGENCY DEPT Provider Note   CSN: [id number] Arrival date & time: 12/28/21  2206     History  Chief Complaint  Patient presents with   Pain    Patrick [name] is a 56 y.o. male.  56 year old male with a history of gout but never had arthrocentesis, diabetes, chronic kidney disease and history of bilateral BKA's secondary to what sounds like diabetic ulcers and osteomyelitis last year.  Who presents the ER for the fifth time and a little over a month secondary to concern for gout.  Patient states that has been in his left elbow, right wrist and right arm.  He states the right side seems to be improving but the left elbow still significantly sore, red and swollen.        Home Medications Prior to Admission medications   Medication Sig Start Date End Date Taking? Authorizing Provider  acetaminophen (TYLENOL) 500 MG tablet Take 1,000 mg by mouth every 6 (six) hours as needed for mild pain or headache.    [provider]  allopurinol (ZYLOPRIM) 100 MG tablet Take 1 tablet (100 mg total) by mouth 2 (two) times daily. 12/16/21   Veryl Speak, MD  amLODipine (NORVASC) 5 MG tablet Take 5 mg by mouth daily. 08/18/21   [provider]  ascorbic acid (VITAMIN C) 1000 MG tablet Take 1 tablet (1,000 mg total) by mouth daily. Patient not taking: Reported on 11/08/2021 09/08/20   Cathlyn Parsons, PA-C  colchicine 0.6 MG tablet Take 2 tablets (1.2 mg) during a gout flareup, then take 1 tablet an hour later. 12/16/21   Veryl Speak, MD  doxycycline (VIBRA-TABS) 100 MG tablet Take 100 mg by mouth 2 (two) times daily. 10/10/21   [provider]  furosemide (LASIX) 20 MG tablet Take 1 tablet (20 mg total) by mouth daily as needed. Patient not taking: Reported on 11/08/2021 11/03/20 11/03/21  [name], MD  gabapentin (NEURONTIN) 300 MG capsule Take 1 capsule (300 mg total) by mouth 3 (three) times daily. Patient taking differently: Take 300 mg by  mouth 3 (three) times daily as needed (pain). 09/08/20   Angiulli, Lavon Paganini, PA-C  indomethacin (INDOCIN) 50 MG capsule Take 50 mg by mouth 3 (three) times daily as needed for moderate pain. 11/20/20   [provider]  insulin aspart (NOVOLOG FLEXPEN) 100 UNIT/ML FlexPen Inject 4 Units into the skin 3 (three) times daily with meals. 09/08/20   Angiulli, Lavon Paganini, PA-C  insulin glargine (LANTUS SOLOSTAR) 100 UNIT/ML Solostar Pen Inject 25 Units into the skin 2 (two) times daily. 09/08/20   Angiulli, Lavon Paganini, PA-C  Insulin Pen Needle 32G X 4 MM MISC Use as directed 09/08/20   [name], Clide Deutscher, MD  Insulin Pen Needle 32G X 4 MM MISC Use as directed 09/09/20   Angiulli, Lavon Paganini, PA-C  JARDIANCE 25 MG TABS tablet Take 25 mg by mouth daily. 09/28/21   [provider]  losartan-hydrochlorothiazide (HYZAAR) 100-25 MG tablet Take 1 tablet by mouth daily. 08/18/21   [provider]  methylPREDNISolone (MEDROL DOSEPAK) 4 MG TBPK tablet Take as directed on package 11/19/21   Horton, Barbette Hair, MD  metoprolol tartrate (LOPRESSOR) 25 MG tablet Take 0.5 tablets (12.5 mg total) by mouth 2 (two) times daily. 09/08/20   Angiulli, Lavon Paganini, PA-C  morphine (MSIR) 15 MG tablet Take 0.5 tablets (7.5 mg total) by mouth every 4 (four) hours as needed for severe pain. 12/05/21   Deno Etienne,  DO  Multiple Vitamins-Minerals (CERTAVITE/ANTIOXIDANTS) TABS Take 1 tablet by mouth daily. Patient not taking: Reported on 12/28/2020 09/29/20   Lavina Hamman, MD  oxyCODONE-acetaminophen (PERCOCET) 5-325 MG tablet Take 2 tablets by mouth every 4 (four) hours as needed. 12/16/21   Veryl Speak, MD  OZEMPIC, 0.25 OR 0.5 MG/DOSE, 2 MG/3ML SOPN Inject 0.5 mg into the skin once a week. 10/07/21   [provider]  predniSONE (DELTASONE) 20 MG tablet 2 tabs po daily x 4 days 12/05/21   Deno Etienne, DO  tiZANidine (ZANAFLEX) 4 MG tablet Take 1 tablet (4 mg total) by mouth 2 (two) times daily as needed for muscle  spasms. 09/29/20   Lavina Hamman, MD  traZODone (DESYREL) 100 MG tablet Take 100 mg by mouth at bedtime. 10/07/21   [provider]  Zinc Sulfate 220 (50 Zn) MG TABS Take 1 tablet (220 mg total) by mouth daily. Patient not taking: Reported on 12/28/2020 09/08/20   [place], Lavon Paganini, PA-C      Allergies    Patient has no known allergies.    Review of Systems   Review of Systems  Physical Exam Updated Vital Signs BP (!) 165/87 (BP Location: Right Arm)   Pulse 99   Temp 98.9 F (37.2 C) (Oral)   Resp 20   Ht 6\' 2"  (1.88 m)   Wt 133 kg   SpO2 100%   BMI 37.65 kg/m  Physical Exam Vitals and nursing note reviewed.  Constitutional:      Appearance: He is well-developed.  HENT:     Head: Normocephalic and atraumatic.     Mouth/Throat:     Mouth: Mucous membranes are moist.     Pharynx: Oropharynx is clear.  Eyes:     Pupils: Pupils are equal, round, and reactive to light.  Cardiovascular:     Rate and Rhythm: Normal rate.  Pulmonary:     Effort: Pulmonary effort is normal. No respiratory distress.  Abdominal:     General: Abdomen is flat. There is no distension.  Musculoskeletal:        General: Normal range of motion.     Cervical back: Normal range of motion.  Skin:    General: Skin is warm and dry.     Comments: Patient with significant pain to his left elbow, worse with range of motion.  It is swollen.  His right forearm is warm to touch.  Much more warm than his left.  No obvious swelling or erythema.  Neurological:     General: No focal deficit present.     Mental Status: He is alert.     ED Results / Procedures / Treatments   Labs (all labs ordered are listed, but only abnormal results are displayed) Labs Reviewed  CBC WITH DIFFERENTIAL/PLATELET - Abnormal; Notable for the following components:      Result Value   WBC 11.7 (*)    RBC 3.11 (*)    Hemoglobin 8.4 (*)    HCT 27.1 (*)    Neutro Abs 8.5 (*)    All other components within normal limits   BASIC METABOLIC PANEL    EKG None  Radiology No results found.  Procedures .Joint Aspiration/Arthrocentesis  Date/Time: 12/29/2021 11:02 PM  Performed by: Merrily Pew, MD Authorized by: Merrily Pew, MD   Consent:    Consent obtained:  Verbal   Consent given by:  Patient   Risks discussed:  Bleeding, pain, nerve damage and infection   Alternatives discussed:  No treatment and delayed treatment Universal protocol:    Procedure explained and questions answered to patient or proxy's satisfaction: yes     Relevant documents present and verified: yes     Test results available: yes     Imaging studies available: yes   Location:    Location:  Elbow   Elbow:  L elbow Anesthesia:    Anesthesia method:  Local infiltration   Local anesthetic:  Lidocaine 2% WITH epi Procedure details:    Preparation: Patient was prepped and draped in usual sterile fashion     Needle gauge:  18 G   Ultrasound guidance: no     Approach:  Lateral   Aspirate amount:  2   Aspirate characteristics:  Bloody Post-procedure details:    Dressing:  Sterile dressing and gauze roll   Procedure completion:  Tolerated     Medications Ordered in ED Medications  methylPREDNISolone sodium succinate (SOLU-MEDROL) 125 mg/2 mL injection 125 mg (has no administration in time range)  HYDROmorphone (DILAUDID) injection 1 mg (has no administration in time range)  lactated ringers bolus 1,000 mL (has no administration in time range)    ED Course/ Medical Decision Making/ A&P                           Medical Decision Making Amount and/or Complexity of Data Reviewed Labs: ordered. Radiology: ordered.  Risk OTC drugs. Prescription drug management.  Mildly elevated white blood cell count.  Had a temperature of 100.0 on arrival, will recheck rectal to ensure he is not febrile.  If he has a fever be concern for septic joint versus just gout as he is never had this confirmed on microscopy.  May need a  arthrocentesis but will treat as gout for now. Arthrocentesis with some fluid but a lot of blood. Suspect culture will be only useful, stain/cell count not helpful.  Discussed with Dr. Marlou Sa, requests MRI and call back. D/w Dr. Clovis Riley with radiology, gadolinium ok per protocol.  Care transferred pending MRI.    Final Clinical Impression(s) / ED Diagnoses Final diagnoses:  None    Rx / DC Orders ED Discharge Orders     None         Deontae Robson, Corene Cornea, MD 12/29/21 2303

## 2022-01-03 ENCOUNTER — Inpatient Hospital Stay (HOSPITAL_COMMUNITY)
Admission: EM | Admit: 2022-01-03 | Discharge: 2022-01-05 | DRG: 637 | Disposition: A | Discharge status: Home / Self Care | Payer: BC Managed Care – PPO | Attending: Internal Medicine | Admitting: Internal Medicine

## 2022-01-03 ENCOUNTER — Emergency Department (HOSPITAL_COMMUNITY): Payer: BC Managed Care – PPO

## 2022-01-03 ENCOUNTER — Encounter (HOSPITAL_COMMUNITY): Payer: Self-pay

## 2022-01-03 ENCOUNTER — Observation Stay (HOSPITAL_COMMUNITY): Payer: BC Managed Care – PPO

## 2022-01-03 DIAGNOSIS — N179 Acute kidney failure, unspecified: Secondary | ICD-10-CM | POA: Diagnosis not present

## 2022-01-03 DIAGNOSIS — S066XAA Traumatic subarachnoid hemorrhage with loss of consciousness status unknown, initial encounter: Secondary | ICD-10-CM | POA: Diagnosis not present

## 2022-01-03 DIAGNOSIS — S062XAA Diffuse traumatic brain injury with loss of consciousness status unknown, initial encounter: Secondary | ICD-10-CM | POA: Diagnosis present

## 2022-01-03 DIAGNOSIS — G9341 Metabolic encephalopathy: Secondary | ICD-10-CM | POA: Diagnosis present

## 2022-01-03 DIAGNOSIS — Z7985 Long-term (current) use of injectable non-insulin antidiabetic drugs: Secondary | ICD-10-CM | POA: Diagnosis not present

## 2022-01-03 DIAGNOSIS — S02832A Fracture of medial orbital wall, left side, initial encounter for closed fracture: Secondary | ICD-10-CM | POA: Diagnosis present

## 2022-01-03 DIAGNOSIS — S01112A Laceration without foreign body of left eyelid and periocular area, initial encounter: Secondary | ICD-10-CM | POA: Diagnosis present

## 2022-01-03 DIAGNOSIS — E1122 Type 2 diabetes mellitus with diabetic chronic kidney disease: Secondary | ICD-10-CM | POA: Diagnosis present

## 2022-01-03 DIAGNOSIS — T68XXXA Hypothermia, initial encounter: Secondary | ICD-10-CM | POA: Diagnosis not present

## 2022-01-03 DIAGNOSIS — E11649 Type 2 diabetes mellitus with hypoglycemia without coma: Secondary | ICD-10-CM | POA: Diagnosis not present

## 2022-01-03 DIAGNOSIS — D649 Anemia, unspecified: Secondary | ICD-10-CM | POA: Diagnosis not present

## 2022-01-03 DIAGNOSIS — E669 Obesity, unspecified: Secondary | ICD-10-CM | POA: Diagnosis not present

## 2022-01-03 DIAGNOSIS — Z89511 Acquired absence of right leg below knee: Secondary | ICD-10-CM | POA: Diagnosis not present

## 2022-01-03 DIAGNOSIS — S0291XA Unspecified fracture of skull, initial encounter for closed fracture: Secondary | ICD-10-CM | POA: Diagnosis not present

## 2022-01-03 DIAGNOSIS — R402411 Glasgow coma scale score 13-15, in the field [EMT or ambulance]: Secondary | ICD-10-CM | POA: Diagnosis not present

## 2022-01-03 DIAGNOSIS — S0232XA Fracture of orbital floor, left side, initial encounter for closed fracture: Secondary | ICD-10-CM | POA: Diagnosis not present

## 2022-01-03 DIAGNOSIS — N1831 Chronic kidney disease, stage 3a: Secondary | ICD-10-CM | POA: Diagnosis not present

## 2022-01-03 DIAGNOSIS — Z89512 Acquired absence of left leg below knee: Secondary | ICD-10-CM | POA: Diagnosis not present

## 2022-01-03 DIAGNOSIS — Z8616 Personal history of COVID-19: Secondary | ICD-10-CM

## 2022-01-03 DIAGNOSIS — S199XXA Unspecified injury of neck, initial encounter: Secondary | ICD-10-CM | POA: Diagnosis not present

## 2022-01-03 DIAGNOSIS — E1151 Type 2 diabetes mellitus with diabetic peripheral angiopathy without gangrene: Secondary | ICD-10-CM | POA: Diagnosis not present

## 2022-01-03 DIAGNOSIS — M25522 Pain in left elbow: Secondary | ICD-10-CM | POA: Diagnosis present

## 2022-01-03 DIAGNOSIS — Z794 Long term (current) use of insulin: Secondary | ICD-10-CM | POA: Diagnosis not present

## 2022-01-03 DIAGNOSIS — E162 Hypoglycemia, unspecified: Secondary | ICD-10-CM | POA: Diagnosis not present

## 2022-01-03 DIAGNOSIS — S06340A Traumatic hemorrhage of right cerebrum without loss of consciousness, initial encounter: Secondary | ICD-10-CM | POA: Diagnosis not present

## 2022-01-03 DIAGNOSIS — W19XXXA Unspecified fall, initial encounter: Secondary | ICD-10-CM | POA: Diagnosis present

## 2022-01-03 DIAGNOSIS — R231 Pallor: Secondary | ICD-10-CM | POA: Diagnosis not present

## 2022-01-03 DIAGNOSIS — S066X0A Traumatic subarachnoid hemorrhage without loss of consciousness, initial encounter: Secondary | ICD-10-CM | POA: Diagnosis not present

## 2022-01-03 DIAGNOSIS — R0689 Other abnormalities of breathing: Secondary | ICD-10-CM | POA: Diagnosis not present

## 2022-01-03 DIAGNOSIS — Z6838 Body mass index (BMI) 38.0-38.9, adult: Secondary | ICD-10-CM | POA: Diagnosis not present

## 2022-01-03 DIAGNOSIS — I129 Hypertensive chronic kidney disease with stage 1 through stage 4 chronic kidney disease, or unspecified chronic kidney disease: Secondary | ICD-10-CM | POA: Diagnosis not present

## 2022-01-03 DIAGNOSIS — Z1152 Encounter for screening for COVID-19: Secondary | ICD-10-CM | POA: Diagnosis not present

## 2022-01-03 DIAGNOSIS — Z79899 Other long term (current) drug therapy: Secondary | ICD-10-CM

## 2022-01-03 DIAGNOSIS — E1165 Type 2 diabetes mellitus with hyperglycemia: Secondary | ICD-10-CM | POA: Diagnosis not present

## 2022-01-03 DIAGNOSIS — M109 Gout, unspecified: Secondary | ICD-10-CM | POA: Diagnosis present

## 2022-01-03 DIAGNOSIS — I10 Essential (primary) hypertension: Secondary | ICD-10-CM | POA: Diagnosis not present

## 2022-01-03 HISTORY — DX: Hypoglycemia, unspecified: E16.2

## 2022-01-03 LAB — ETHANOL: Alcohol, Ethyl (B): <10 mg/dL (ref <10)

## 2022-01-03 LAB — CBC WITH DIFFERENTIAL/PLATELET
Abs Immature Granulocytes: 0.28 10*3/uL — ABNORMAL HIGH (ref 0.00–0.07)
Basophils Absolute: 0.1 10*3/uL (ref 0.0–0.1)
Basophils Relative: 1 %
Eosinophils Absolute: 0.2 10*3/uL (ref 0.0–0.5)
Eosinophils Relative: 2 %
HCT: 30.4 % — ABNORMAL LOW (ref 39.0–52.0)
Hemoglobin: 9.4 g/dL — ABNORMAL LOW (ref 13.0–17.0)
Immature Granulocytes: 3 %
Lymphocytes Relative: 13 %
Lymphs Abs: 1.3 10*3/uL (ref 0.7–4.0)
MCH: 27.2 pg (ref 26.0–34.0)
MCHC: 30.9 g/dL (ref 30.0–36.0)
MCV: 88.1 fL (ref 80.0–100.0)
Monocytes Absolute: 1.5 10*3/uL — ABNORMAL HIGH (ref 0.1–1.0)
Monocytes Relative: 15 %
Neutro Abs: 6.9 10*3/uL (ref 1.7–7.7)
Neutrophils Relative %: 66 %
Platelets: 411 10*3/uL — ABNORMAL HIGH (ref 150–400)
RBC: 3.45 MIL/uL — ABNORMAL LOW (ref 4.22–5.81)
RDW: 14.8 % (ref 11.5–15.5)
WBC: 10.2 10*3/uL (ref 4.0–10.5)
nRBC: 0.2 % (ref 0.0–0.2)

## 2022-01-03 LAB — BASIC METABOLIC PANEL
Anion gap: 16 — ABNORMAL HIGH (ref 5–15)
BUN: 56 mg/dL — ABNORMAL HIGH (ref 6–20)
CO2: 15 mmol/L — ABNORMAL LOW (ref 22–32)
Calcium: 7.4 mg/dL — ABNORMAL LOW (ref 8.9–10.3)
Chloride: 109 mmol/L (ref 98–111)
Creatinine, Ser: 3.56 mg/dL — ABNORMAL HIGH (ref 0.61–1.24)
GFR, Estimated: 19 mL/min — ABNORMAL LOW (ref >60)
Glucose, Bld: 35 mg/dL — CL (ref 70–99)
Potassium: 4.9 mmol/L (ref 3.5–5.1)
Sodium: 140 mmol/L (ref 135–145)

## 2022-01-03 LAB — HEPATIC FUNCTION PANEL
ALT: 20 U/L (ref 0–44)
AST: 37 U/L (ref 15–41)
Albumin: 3 g/dL — ABNORMAL LOW (ref 3.5–5.0)
Alkaline Phosphatase: 86 U/L (ref 38–126)
Bilirubin, Direct: 0.1 mg/dL (ref 0.0–0.2)
Indirect Bilirubin: 0.2 mg/dL — ABNORMAL LOW (ref 0.3–0.9)
Total Bilirubin: 0.3 mg/dL (ref 0.3–1.2)
Total Protein: 6.4 g/dL — ABNORMAL LOW (ref 6.5–8.1)

## 2022-01-03 LAB — CBG MONITORING, ED
Glucose-Capillary: 22 mg/dL — CL (ref 70–99)
Glucose-Capillary: 80 mg/dL (ref 70–99)
Glucose-Capillary: 22 mg/dL — CL (ref 70–99)
Glucose-Capillary: 45 mg/dL — ABNORMAL LOW (ref 70–99)
Glucose-Capillary: 76 mg/dL (ref 70–99)
Glucose-Capillary: 32 mg/dL — CL (ref 70–99)
Glucose-Capillary: 117 mg/dL — ABNORMAL HIGH (ref 70–99)
Glucose-Capillary: 41 mg/dL — CL (ref 70–99)
Glucose-Capillary: 64 mg/dL — ABNORMAL LOW (ref 70–99)
Glucose-Capillary: 73 mg/dL (ref 70–99)
Glucose-Capillary: 80 mg/dL (ref 70–99)

## 2022-01-03 LAB — RAPID URINE DRUG SCREEN, HOSP PERFORMED
Amphetamines: NOT DETECTED
Barbiturates: NOT DETECTED
Benzodiazepines: NOT DETECTED
Cocaine: NOT DETECTED
Opiates: NOT DETECTED
Tetrahydrocannabinol: NOT DETECTED

## 2022-01-03 LAB — GLUCOSE, CAPILLARY: Glucose-Capillary: 182 mg/dL — ABNORMAL HIGH (ref 70–99)

## 2022-01-03 LAB — LACTIC ACID, PLASMA: Lactic Acid, Venous: 0.8 mmol/L (ref 0.5–1.9)

## 2022-01-03 LAB — TSH: TSH: 1.257 u[IU]/mL (ref 0.350–4.500)

## 2022-01-03 MED ORDER — DEXTROSE 10 % IV SOLN
100.0000 mL | Freq: Once | INTRAVENOUS | Status: AC
  Administered 2022-01-03: 100 mL via INTRAVENOUS

## 2022-01-03 MED ORDER — DEXTROSE 50 % IV SOLN
1.0000 | Freq: Once | INTRAVENOUS | Status: AC
  Administered 2022-01-03: 50 mL via INTRAVENOUS

## 2022-01-03 MED ORDER — HEPARIN SODIUM (PORCINE) 5000 UNIT/ML IJ SOLN: 5000.0000 [IU] | Freq: Two times a day (BID) | INTRAMUSCULAR | Status: DC

## 2022-01-03 MED ORDER — DEXTROSE 50 % IV SOLN: 1.0000 | Freq: Once | INTRAVENOUS | Status: AC

## 2022-01-03 MED ORDER — TRAZODONE HCL 100 MG PO TABS
100.0000 mg | ORAL_TABLET | Freq: Every day | ORAL | Status: DC
  Administered 2022-01-03 – 2022-01-04 (×2): 100 mg via ORAL
  Filled 2022-01-03 (×2): qty 1

## 2022-01-03 MED ORDER — ACETAMINOPHEN 500 MG PO TABS
1000.0000 mg | ORAL_TABLET | Freq: Four times a day (QID) | ORAL | Status: DC | PRN
  Administered 2022-01-03: 1000 mg via ORAL
  Filled 2022-01-03: qty 2

## 2022-01-03 MED ORDER — OXYCODONE-ACETAMINOPHEN 5-325 MG PO TABS: 1.0000 | ORAL_TABLET | Freq: Four times a day (QID) | ORAL | Status: DC | PRN

## 2022-01-03 MED ORDER — LIDOCAINE-EPINEPHRINE (PF) 2 %-1:200000 IJ SOLN
20.0000 mL | Freq: Once | INTRAMUSCULAR | Status: AC
  Administered 2022-01-03: 20 mL
  Filled 2022-01-03: qty 20

## 2022-01-03 MED ORDER — INSULIN ASPART 100 UNIT/ML IJ SOLN
0.0000 [IU] | Freq: Three times a day (TID) | INTRAMUSCULAR | Status: DC
  Administered 2022-01-04: 2 [IU] via SUBCUTANEOUS
  Administered 2022-01-04: 5 [IU] via SUBCUTANEOUS
  Administered 2022-01-04 – 2022-01-05 (×2): 2 [IU] via SUBCUTANEOUS

## 2022-01-03 MED ORDER — ALLOPURINOL 100 MG PO TABS
100.0000 mg | ORAL_TABLET | Freq: Two times a day (BID) | ORAL | Status: DC
  Administered 2022-01-03 – 2022-01-05 (×5): 100 mg via ORAL
  Filled 2022-01-03 (×5): qty 1

## 2022-01-03 MED ORDER — DEXTROSE 50 % IV SOLN
INTRAVENOUS | Status: AC
  Administered 2022-01-03: 50 mL via INTRAVENOUS
  Filled 2022-01-03: qty 50

## 2022-01-03 MED ORDER — GLUCAGON HCL RDNA (DIAGNOSTIC) 1 MG IJ SOLR
1.0000 mg | Freq: Once | INTRAMUSCULAR | Status: AC
  Administered 2022-01-03: 1 mg via INTRAVENOUS
  Filled 2022-01-03: qty 1

## 2022-01-03 MED ORDER — GABAPENTIN 300 MG PO CAPS
300.0000 mg | ORAL_CAPSULE | Freq: Three times a day (TID) | ORAL | Status: DC | PRN
  Administered 2022-01-03: 300 mg via ORAL
  Filled 2022-01-03: qty 1

## 2022-01-03 MED ORDER — PREDNISONE 20 MG PO TABS
40.0000 mg | ORAL_TABLET | Freq: Every day | ORAL | Status: DC
  Administered 2022-01-03 – 2022-01-05 (×3): 40 mg via ORAL
  Filled 2022-01-03 (×3): qty 2

## 2022-01-03 MED ORDER — DEXTROSE 10 % IV SOLN
100.0000 mL | Freq: Once | INTRAVENOUS | Status: DC
  Administered 2022-01-03: 100 mL via INTRAVENOUS

## 2022-01-03 MED ORDER — DEXTROSE 10 % IV SOLN: INTRAVENOUS | Status: DC

## 2022-01-03 MED ORDER — DEXTROSE-NACL 5-0.9 % IV SOLN: INTRAVENOUS | Status: DC

## 2022-01-03 MED ORDER — DEXTROSE 50 % IV SOLN
INTRAVENOUS | Status: AC
  Filled 2022-01-03: qty 50

## 2022-01-03 MED ORDER — HYDRALAZINE HCL 20 MG/ML IJ SOLN: 5.0000 mg | Freq: Four times a day (QID) | INTRAMUSCULAR | Status: DC | PRN

## 2022-01-03 MED ORDER — SODIUM CHLORIDE 0.9 % IV BOLUS
1000.0000 mL | Freq: Once | INTRAVENOUS | Status: AC
  Administered 2022-01-03: 1000 mL via INTRAVENOUS

## 2022-01-03 MED ORDER — NIMODIPINE 30 MG PO CAPS
60.0000 mg | ORAL_CAPSULE | ORAL | Status: DC
  Administered 2022-01-03 – 2022-01-05 (×12): 60 mg via ORAL
  Filled 2022-01-03 (×16): qty 2

## 2022-01-03 MED ORDER — HYDRALAZINE HCL 20 MG/ML IJ SOLN: 10.0000 mg | Freq: Four times a day (QID) | INTRAMUSCULAR | Status: DC | PRN

## 2022-01-03 MED ORDER — TIZANIDINE HCL 4 MG PO TABS
4.0000 mg | ORAL_TABLET | Freq: Two times a day (BID) | ORAL | Status: DC | PRN
  Administered 2022-01-03 – 2022-01-04 (×2): 4 mg via ORAL
  Filled 2022-01-03 (×2): qty 1

## 2022-01-03 MED ORDER — AMLODIPINE BESYLATE 5 MG PO TABS
5.0000 mg | ORAL_TABLET | Freq: Every day | ORAL | Status: DC
  Administered 2022-01-03 – 2022-01-05 (×3): 5 mg via ORAL
  Filled 2022-01-03 (×3): qty 1

## 2022-01-03 MED ORDER — METOPROLOL TARTRATE 12.5 MG HALF TABLET
12.5000 mg | ORAL_TABLET | Freq: Two times a day (BID) | ORAL | Status: DC
  Administered 2022-01-03 – 2022-01-05 (×5): 12.5 mg via ORAL
  Filled 2022-01-03 (×5): qty 1

## 2022-01-03 NOTE — ED Provider Notes (Signed)
[facility] EMERGENCY DEPARTMENT Provider Note   CSN: [id number] Arrival date & time: 01/03/22  5631     History  Chief Complaint  Patient presents with   Patrick [name] is a 56 y.o. male.  HPI     This is a 56 year old male with history of diabetes and gout, bilateral BKA's who presents following a fall.  Per EMS report, he was found on the floor by his roommate.  He was face first.  Upon EMS arrival, he was altered.  Initial CBG 48.  EMS gave 25 g of D10.  He had a noted laceration to the vapes.  Patient does not recall any events.  He states he does not take his insulin regularly and does not monitor his blood sugars.  Denies any recent illnesses.  Of note, he was seen and evaluated for left elbow pain.  This was thought to be related to gout.  He denies any fevers, cough, shortness of breath.  States his elbow pain is getting better.  Home Medications Prior to Admission medications   Medication Sig Start Date End Date Taking? Authorizing Provider  allopurinol (ZYLOPRIM) 100 MG tablet Take 1 tablet (100 mg total) by mouth 2 (two) times daily. 12/16/21  Yes Delo, Nathaneil Canary, MD  amLODipine (NORVASC) 5 MG tablet Take 5 mg by mouth daily. 08/18/21  Yes [provider]  ascorbic acid (VITAMIN C) 1000 MG tablet Take 1 tablet (1,000 mg total) by mouth daily. 09/08/20  Yes Angiulli, Lavon Paganini, PA-C  gabapentin (NEURONTIN) 300 MG capsule Take 1 capsule (300 mg total) by mouth 3 (three) times daily. Patient taking differently: Take 300 mg by mouth 3 (three) times daily as needed (pain). 09/08/20  Yes Angiulli, Lavon Paganini, PA-C  insulin aspart (NOVOLOG FLEXPEN) 100 UNIT/ML FlexPen Inject 4 Units into the skin 3 (three) times daily with meals. 09/08/20  Yes Angiulli, Lavon Paganini, PA-C  insulin glargine (LANTUS SOLOSTAR) 100 UNIT/ML Solostar Pen Inject 25 Units into the skin 2 (two) times daily. 09/08/20  Yes Angiulli, Lavon Paganini, PA-C  JARDIANCE 25 MG TABS tablet Take 25 mg  by mouth daily. 09/28/21  Yes [provider]  losartan-hydrochlorothiazide (HYZAAR) 100-25 MG tablet Take 1 tablet by mouth daily. 08/18/21  Yes [provider]  metoprolol tartrate (LOPRESSOR) 25 MG tablet Take 0.5 tablets (12.5 mg total) by mouth 2 (two) times daily. 09/08/20  Yes Angiulli, Lavon Paganini, PA-C  morphine (MSIR) 15 MG tablet Take 0.5 tablets (7.5 mg total) by mouth every 4 (four) hours as needed for severe pain. 12/05/21  Yes Deno Etienne, DO  Multiple Vitamins-Minerals (CERTAVITE/ANTIOXIDANTS) TABS Take 1 tablet by mouth daily. 09/29/20  Yes Lavina Hamman, MD  predniSONE (DELTASONE) 20 MG tablet Take 2 tablets (40 mg total) by mouth daily. 12/29/21  Yes Plunkett, Loree Fee, MD  tiZANidine (ZANAFLEX) 4 MG tablet Take 1 tablet (4 mg total) by mouth 2 (two) times daily as needed for muscle spasms. 09/29/20  Yes Lavina Hamman, MD  traZODone (DESYREL) 100 MG tablet Take 100 mg by mouth at bedtime. 10/07/21  Yes [provider]  acetaminophen (TYLENOL) 500 MG tablet Take 1,000 mg by mouth every 6 (six) hours as needed for mild pain or headache.    [provider]  colchicine 0.6 MG tablet Take 2 tablets (1.2 mg) during a gout flareup, then take 1 tablet an hour later. 12/16/21   Veryl Speak, MD  doxycycline (VIBRA-TABS) 100 MG tablet Take  100 mg by mouth 2 (two) times daily. Patient not taking: Reported on 01/03/2022 10/10/21   [provider]  furosemide (LASIX) 20 MG tablet Take 1 tablet (20 mg total) by mouth daily as needed. Patient not taking: Reported on 11/08/2021 11/03/20 11/03/21  [name], MD  indomethacin (INDOCIN) 50 MG capsule Take 50 mg by mouth 3 (three) times daily as needed for moderate pain. 11/20/20   [provider]  Insulin Pen Needle 32G X 4 MM MISC Use as directed 09/08/20   [name], Clide Deutscher, MD  Insulin Pen Needle 32G X 4 MM MISC Use as directed 09/09/20   Angiulli, Lavon Paganini, PA-C  methylPREDNISolone (MEDROL DOSEPAK)  4 MG TBPK tablet Take as directed on package 11/19/21   Patrick [name], Patrick Hair, MD  oxyCODONE-acetaminophen (PERCOCET/ROXICET) 5-325 MG tablet Take 1 tablet by mouth every 6 (six) hours as needed for severe pain. 12/29/21   Plunkett, Loree Fee, MD  OZEMPIC, 0.25 OR 0.5 MG/DOSE, 2 MG/3ML SOPN Inject 0.5 mg into the skin once a week. Patient not taking: Reported on 01/03/2022 10/07/21   [provider]  Zinc Sulfate 220 (50 Zn) MG TABS Take 1 tablet (220 mg total) by mouth daily. 09/08/20   Angiulli, Lavon Paganini, PA-C      Allergies    Patient has no known allergies.    Review of Systems   Review of Systems  Constitutional:  Negative for fever.  Respiratory:  Negative for shortness of breath.   Cardiovascular:  Negative for chest pain.  Gastrointestinal:  Negative for abdominal pain.  All other systems reviewed and are negative.   Physical Exam Updated Vital Signs BP (!) 177/112   Pulse 67   Temp 98.2 F (36.8 C) (Oral)   Resp 11   Ht 1.88 m (6\' 2" )   Wt 136.1 kg   SpO2 99%   BMI 38.52 kg/m  Physical Exam Vitals and nursing note reviewed.  Constitutional:      Appearance: He is well-developed.     Comments: morbidly obese, somnolent but arousable  HENT:     Head: Normocephalic.     Comments: Blood over the face, laceration left eyebrow, abrasion to the lip, teeth appear intact Eyes:     Extraocular Movements: Extraocular movements intact.     Pupils: Pupils are equal, round, and reactive to light.  Neck:     Comments: C-collar in place Cardiovascular:     Rate and Rhythm: Normal rate and regular rhythm.     Heart sounds: Normal heart sounds. No murmur heard. Pulmonary:     Effort: Pulmonary effort is normal. No respiratory distress.     Breath sounds: Normal breath sounds. No wheezing.  Abdominal:     Palpations: Abdomen is soft.     Tenderness: There is no abdominal tenderness.  Musculoskeletal:     Comments: Bilateral BKA  Skin:    General: Skin is warm and dry.   Neurological:     Mental Status: He is alert.     Comments: Somnolent but is able to answer questions, oriented x3  Psychiatric:        Mood and Affect: Mood normal.     ED Results / Procedures / Treatments   Labs (all labs ordered are listed, but only abnormal results are displayed) Labs Reviewed  CBC WITH DIFFERENTIAL/PLATELET - Abnormal; Notable for the following components:      Result Value   RBC 3.45 (*)    Hemoglobin 9.4 (*)  HCT 30.4 (*)    Platelets 411 (*)    Monocytes Absolute 1.5 (*)    Abs Immature Granulocytes 0.28 (*)    All other components within normal limits  BASIC METABOLIC PANEL - Abnormal; Notable for the following components:   CO2 15 (*)    Glucose, Bld 35 (*)    BUN 56 (*)    Creatinine, Ser 3.56 (*)    Calcium 7.4 (*)    GFR, Estimated 19 (*)    Anion gap 16 (*)    All other components within normal limits  CBG MONITORING, ED - Abnormal; Notable for the following components:   Glucose-Capillary 22 (*)    All other components within normal limits  CBG MONITORING, ED - Abnormal; Notable for the following components:   Glucose-Capillary 45 (*)    All other components within normal limits  ETHANOL  RAPID URINE DRUG SCREEN, HOSP PERFORMED  TSH  C-PEPTIDE  LACTIC ACID, PLASMA  LACTIC ACID, PLASMA  HEPATIC FUNCTION PANEL  CBG MONITORING, ED  CBG MONITORING, ED    EKG None  Radiology No results found.  Procedures .Critical Care  Performed by: Patrick Hacker, MD Authorized by: Patrick Hacker, MD   Critical care provider statement:    Critical care time (minutes):  40   Critical care was necessary to treat or prevent imminent or life-threatening deterioration of the following conditions:  Endocrine crisis and trauma   Critical care was time spent personally by me on the following activities:  Development of treatment plan with patient or surrogate, discussions with consultants, evaluation of patient's response to treatment,  examination of patient, ordering and review of laboratory studies, ordering and review of radiographic studies, ordering and performing treatments and interventions, pulse oximetry, re-evaluation of patient's condition and review of old charts     Medications Ordered in ED Medications  dextrose 10 % infusion (100 mLs Intravenous New Bag/Given 01/03/22 0707)  lidocaine-EPINEPHrine (XYLOCAINE W/EPI) 2 %-1:200000 (PF) injection 20 mL (has no administration in time range)  dextrose 50 % solution 50 mL (50 mLs Intravenous Given 01/03/22 0543)  sodium chloride 0.9 % bolus 1,000 mL (1,000 mLs Intravenous New Bag/Given 01/03/22 0708)    ED Course/ Medical Decision Making/ A&P                           Medical Decision Making Amount and/or Complexity of Data Reviewed Labs: ordered. Radiology: ordered.  Risk Prescription drug management.   This patient presents to the ED for concern of fall, hypoglycemia, this involves an extensive number of treatment options, and is a complaint that carries with it a high risk of complications and morbidity.  I considered the following differential and admission for this acute, potentially life threatening condition.  The differential diagnosis includes hypoglycemia related to systemic illness, exogenous insulin use, neck injury related to fall such as intracranial hemorrhage, laceration  MDM:    This is a 56 year old male who presents following a fall.  Notable hypoglycemia.  States that he does not take insulin.  He is a known diabetic.  Repeat blood sugar here 22.  He was given an amp of D50.  This increased his blood sugar in the immediate timeframe but it trended back downwards.  He was started on a D10 drip.  He is also given fluids.  Creatinine noted to be above the patient's baseline at 3.56.  BUN 56.  When his persistent hypoglycemia, additional lab work  was ordered.  I requested a rectal procedure.  Added C-peptide, TSH, lactate.  Laceration to be  repaired by PA.    CT head and neck have been ordered to evaluate for trauma.  (Labs, imaging, consults)  Labs: I Ordered, and personally interpreted labs.  The pertinent results include: CBC, CMP, CBG, lactate, TSH  Imaging Studies ordered: I ordered imaging studies including CT head neck ordered I independently visualized and interpreted imaging. I agree with the radiologist interpretation  Additional history obtained from chart review.  External records from outside source obtained and reviewed including the ED visit and evaluation.  Cardiac Monitoring: The patient was maintained on a cardiac monitor.  I personally viewed and interpreted the cardiac monitored which showed an underlying rhythm of: Sinus rhythm  Reevaluation: After the interventions noted above, I reevaluated the patient and found that they have : reMain somnolent but stable  Social Determinants of Health:  disability  Disposition: Anticipate admission  Co morbidities that complicate the patient evaluation  Past Medical History:  Diagnosis Date   Anemia    Diabetes mellitus    Gout    History of COVID-19 04/2019   Hypertension    PVD (peripheral vascular disease) ([place])    s/p R BKA     Medicines Meds ordered this encounter  Medications   dextrose 50 % solution 50 mL   dextrose 50 % solution    Luecke, Arielle: cabinet override   dextrose 10 % infusion   sodium chloride 0.9 % bolus 1,000 mL   lidocaine-EPINEPHrine (XYLOCAINE W/EPI) 2 %-1:200000 (PF) injection 20 mL    I have reviewed the patients home medicines and have made adjustments as needed  Problem List / ED Course: Problem List Items Addressed This Visit   None Visit Diagnoses     Hypoglycemia    -  Primary   Fall, initial encounter       Laceration of left eyebrow, initial encounter                       Final Clinical Impression(s) / ED Diagnoses Final diagnoses:  Hypoglycemia  Fall, initial encounter  Laceration  of left eyebrow, initial encounter    Rx / DC Orders ED Discharge Orders     None         Bianka Liberati, Patrick Hair, MD 01/03/22 [phone]

## 2022-01-03 NOTE — ED Notes (Signed)
Pt provided with two apple juices. Pt also eating some chicken and pasta from his lunch tray.

## 2022-01-03 NOTE — H&P (Signed)
History and Physical    [name]:[id number] DOB: 08-28-1965 DOA: 01/03/2022  PCP: [facility] (Confirm with patient/family/NH records and if not entered, this has to be entered at [facility] point of entry) Patient coming from: Home  I have personally briefly reviewed patient's old medical records in [place]  Chief Complaint: Feeling sleepy  HPI: Patrick [name] is a 56 y.o. male with medical history significant of HTN, IDDM, PVD with lateral BKA, gout, obesity, CKD stage IIIa, came with fall and AMS.  Roommate found patient on the floor this morning and called EMS.  EMS arrived and found glucose<50, and 25 g of D10 given.  Patient remained drowsy and confused on route to ED.  ED Course: Afebrile, no tachycardia nonhypotensive, non-hypoxic.  Glucose level remained low after several rounds of D50 pushes, D5 followed by D10 started by ED physician.  Most recent fingerstick check 35.  1 more dose o D50 given.  Blood work creatinine 3.5 compared to most recent baseline 2.8, BUN 56, bicarb 15.  CT head small hemorrhagic contusion of the right frontal lobe and small amount of SAH left sylvian fissure, fracture of the floor medial wall left orbit. Case reviewed by neurosurgery and ENT in ED.  Review of Systems: Unable to perform, patient is confused.  Past Medical History:  Diagnosis Date   Anemia    Diabetes mellitus    Gout    History of COVID-19 04/2019   Hypertension    PVD (peripheral vascular disease) ([place])    s/p R BKA    Past Surgical History:  Procedure Laterality Date   AMPUTATION Right 06/06/2020   Procedure: AMPUTATION 5TH RAY;  Surgeon: [name], MD;  Location: [place];  Service: Orthopedics;  Laterality: Right;   AMPUTATION Right 06/08/2020   Procedure: RIGHT 4TH RAY AMPUTATION;  Surgeon: [name], MD;  Location: [place];  Service: Orthopedics;  Laterality: Right;   AMPUTATION Right 08/27/2020   Procedure: AMPUTATION BELOW KNEE;  Surgeon: Jessy Oto, MD;  Location: [place];  Service: Orthopedics;  Laterality: Right;   AMPUTATION Left 12/30/2020   Procedure: LEFT BELOW KNEE AMPUTATION;  Surgeon: [name], MD;  Location: [place];  Service: Orthopedics;  Laterality: Left;   CHOLECYSTECTOMY N/A 09/25/2020   Procedure: LAPAROSCOPIC CHOLECYSTECTOMY;  Surgeon: Jesusita Oka, MD;  Location: [place];  Service: General;  Laterality: N/A;     reports that he has never smoked. He has never used smokeless tobacco. He reports that he does not drink alcohol and does not use drugs.  No Known Allergies  Family History  Problem Relation Age of Onset   Diabetes Neg Hx      Prior to Admission medications   Medication Sig Start Date End Date Taking? Authorizing Provider  allopurinol (ZYLOPRIM) 100 MG tablet Take 1 tablet (100 mg total) by mouth 2 (two) times daily. 12/16/21  Yes Delo, Nathaneil Canary, MD  amLODipine (NORVASC) 5 MG tablet Take 5 mg by mouth daily. 08/18/21  Yes [provider]  ascorbic acid (VITAMIN C) 1000 MG tablet Take 1 tablet (1,000 mg total) by mouth daily. 09/08/20  Yes Angiulli, Lavon Paganini, PA-C  gabapentin (NEURONTIN) 300 MG capsule Take 1 capsule (300 mg total) by mouth 3 (three) times daily. Patient taking differently: Take 300 mg by mouth 3 (three) times daily as needed (pain). 09/08/20  Yes Angiulli, Lavon Paganini, PA-C  insulin aspart (NOVOLOG FLEXPEN) 100 UNIT/ML FlexPen Inject 4 Units into  the skin 3 (three) times daily with meals. 09/08/20  Yes Angiulli, Lavon Paganini, PA-C  insulin glargine (LANTUS SOLOSTAR) 100 UNIT/ML Solostar Pen Inject 25 Units into the skin 2 (two) times daily. 09/08/20  Yes Angiulli, Lavon Paganini, PA-C  JARDIANCE 25 MG TABS tablet Take 25 mg by mouth daily. 09/28/21  Yes [provider]  losartan-hydrochlorothiazide (HYZAAR) 100-25 MG tablet Take 1 tablet by mouth daily. 08/18/21  Yes [provider]  metoprolol tartrate (LOPRESSOR) 25 MG tablet Take 0.5 tablets (12.5 mg total) by mouth 2  (two) times daily. 09/08/20  Yes Angiulli, Lavon Paganini, PA-C  morphine (MSIR) 15 MG tablet Take 0.5 tablets (7.5 mg total) by mouth every 4 (four) hours as needed for severe pain. 12/05/21  Yes Deno Etienne, DO  Multiple Vitamins-Minerals (CERTAVITE/ANTIOXIDANTS) TABS Take 1 tablet by mouth daily. 09/29/20  Yes Lavina Hamman, MD  predniSONE (DELTASONE) 20 MG tablet Take 2 tablets (40 mg total) by mouth daily. 12/29/21  Yes Plunkett, Loree Fee, MD  tiZANidine (ZANAFLEX) 4 MG tablet Take 1 tablet (4 mg total) by mouth 2 (two) times daily as needed for muscle spasms. 09/29/20  Yes Lavina Hamman, MD  traZODone (DESYREL) 100 MG tablet Take 100 mg by mouth at bedtime. 10/07/21  Yes [provider]  acetaminophen (TYLENOL) 500 MG tablet Take 1,000 mg by mouth every 6 (six) hours as needed for mild pain or headache.    [provider]  colchicine 0.6 MG tablet Take 2 tablets (1.2 mg) during a gout flareup, then take 1 tablet an hour later. 12/16/21   Veryl Speak, MD  doxycycline (VIBRA-TABS) 100 MG tablet Take 100 mg by mouth 2 (two) times daily. Patient not taking: Reported on 01/03/2022 10/10/21   [provider]  furosemide (LASIX) 20 MG tablet Take 1 tablet (20 mg total) by mouth daily as needed. Patient not taking: Reported on 11/08/2021 11/03/20 11/03/21  [name], MD  indomethacin (INDOCIN) 50 MG capsule Take 50 mg by mouth 3 (three) times daily as needed for moderate pain. 11/20/20   [provider]  Insulin Pen Needle 32G X 4 MM MISC Use as directed 09/08/20   [name], Clide Deutscher, MD  Insulin Pen Needle 32G X 4 MM MISC Use as directed 09/09/20   Angiulli, Lavon Paganini, PA-C  methylPREDNISolone (MEDROL DOSEPAK) 4 MG TBPK tablet Take as directed on package 11/19/21   Horton, Barbette Hair, MD  oxyCODONE-acetaminophen (PERCOCET/ROXICET) 5-325 MG tablet Take 1 tablet by mouth every 6 (six) hours as needed for severe pain. 12/29/21   Plunkett, Loree Fee, MD  OZEMPIC, 0.25 OR 0.5  MG/DOSE, 2 MG/3ML SOPN Inject 0.5 mg into the skin once a week. Patient not taking: Reported on 01/03/2022 10/07/21   [provider]  Zinc Sulfate 220 (50 Zn) MG TABS Take 1 tablet (220 mg total) by mouth daily. 09/08/20   Cathlyn Parsons, PA-C    Physical Exam: Vitals:   01/03/22 0830 01/03/22 1000 01/03/22 1030 01/03/22 1039  BP: (!) 172/104 (!) 175/101 (!) 162/80   Pulse: 77 79 80   Resp: 13 15 12    Temp:    (!) 97.5 F (36.4 C)  TempSrc:    Oral  SpO2: 100% 100% 100%   Weight:      Height:        Constitutional: NAD, calm, comfortable Vitals:   01/03/22 0830 01/03/22 1000 01/03/22 1030 01/03/22 1039  BP: (!) 172/104 (!) 175/101 (!) 162/80   Pulse: 77 79  80   Resp: 13 15 12    Temp:    (!) 97.5 F (36.4 C)  TempSrc:    Oral  SpO2: 100% 100% 100%   Weight:      Height:       Eyes: PERRL, lids and conjunctivae normal ENMT: Mucous membranes are moist. Posterior pharynx clear of any exudate or lesions.Normal dentition.  Neck: normal, supple, no masses, no thyromegaly Respiratory: clear to auscultation bilaterally, no wheezing, no crackles. Normal respiratory effort. No accessory muscle use.  Cardiovascular: Regular rate and rhythm, no murmurs / rubs / gallops. No extremity edema. 2+ pedal pulses. No carotid bruits.  Abdomen: no tenderness, no masses palpated. No hepatosplenomegaly. Bowel sounds positive.  Musculoskeletal: Left elbow tender to touch and reduced ROM. Good ROM, no contractures. Normal muscle tone.  Skin: no rashes, lesions, ulcers. No induration Neurologic: No facial droops, moving all limbs, following simple commands psychiatric: Easily arousable, confused.     Labs on Admission: I have personally reviewed following labs and imaging studies  CBC: Recent Labs  Lab 12/28/21 2352 01/03/22 0522  WBC 11.7* 10.2  NEUTROABS 8.5* 6.9  HGB 8.4* 9.4*  HCT 27.1* 30.4*  MCV 87.1 88.1  PLT 371 009*   Basic Metabolic Panel: Recent Labs  Lab  12/28/21 2352 01/03/22 0522  NA 135 140  K 4.6 4.9  CL 105 109  CO2 18* 15*  GLUCOSE 236* 35*  BUN 39* 56*  CREATININE 2.98* 3.56*  CALCIUM 7.3* 7.4*   GFR: Estimated Creatinine Clearance: 34.4 mL/min (A) (by C-G formula based on SCr of 3.56 mg/dL (H)). Liver Function Tests: Recent Labs  Lab 01/03/22 0705  AST 37  ALT 20  ALKPHOS 86  BILITOT 0.3  PROT 6.4*  ALBUMIN 3.0*   No results for input(s): "LIPASE", "AMYLASE" in the last 168 hours. No results for input(s): "AMMONIA" in the last 168 hours. Coagulation Profile: No results for input(s): "INR", "PROTIME" in the last 168 hours. Cardiac Enzymes: No results for input(s): "CKTOTAL", "CKMB", "CKMBINDEX", "TROPONINI" in the last 168 hours. BNP (last 3 results) No results for input(s): "PROBNP" in the last 8760 hours. HbA1C: No results for input(s): "HGBA1C" in the last 72 hours. CBG: Recent Labs  Lab 01/03/22 0602 01/03/22 0640 01/03/22 0721 01/03/22 0806 01/03/22 1025  GLUCAP 80 45* 22* 76 32*   Lipid Profile: No results for input(s): "CHOL", "HDL", "LDLCALC", "TRIG", "CHOLHDL", "LDLDIRECT" in the last 72 hours. Thyroid Function Tests: Recent Labs    01/03/22 0705  TSH 1.257   Anemia Panel: No results for input(s): "VITAMINB12", "FOLATE", "FERRITIN", "TIBC", "IRON", "RETICCTPCT" in the last 72 hours. Urine analysis:    Component Value Date/Time   COLORURINE YELLOW 09/24/2020 2355   APPEARANCEUR CLEAR 09/24/2020 2355   LABSPEC 1.017 09/24/2020 2355   PHURINE 5.0 09/24/2020 2355   GLUCOSEU NEGATIVE 09/24/2020 2355   HGBUR MODERATE (A) 09/24/2020 2355   HGBUR negative 01/27/2010 0934   BILIRUBINUR NEGATIVE 09/24/2020 2355   KETONESUR NEGATIVE 09/24/2020 2355   PROTEINUR 100 (A) 09/24/2020 2355   UROBILINOGEN 0.2 10/26/2013 1130   NITRITE NEGATIVE 09/24/2020 2355   LEUKOCYTESUR NEGATIVE 09/24/2020 2355    Radiological Exams on Admission: CT Head Wo Contrast  Result Date: 01/03/2022 CLINICAL DATA:   Head trauma, moderate-severe.  Fall. EXAM: CT HEAD WITHOUT CONTRAST TECHNIQUE: Contiguous axial images were obtained from the base of the skull through the vertex without intravenous contrast. RADIATION DOSE REDUCTION: This exam was performed according to the departmental dose-optimization program which  includes automated exposure control, adjustment of the mA and/or kV according to patient size and/or use of iterative reconstruction technique. COMPARISON:  Head CT and MRI 11/08/2021 FINDINGS: Brain: A 5 mm focus of acute hemorrhage superficially in the anterolateral right frontal lobe is compatible with a small hemorrhagic contusion with minimal surrounding edema. There is a small amount of subarachnoid hemorrhage in the left sylvian fissure. No acute infarct, midline shift, or extra-axial fluid collection is identified. The ventricles are normal in size. Patchy hypodensities in the cerebral white matter bilaterally are unchanged and nonspecific but compatible with moderately age advanced chronic small vessel ischemic disease. Vascular: No definite hyperdense vessel within limitations of acute hemorrhage. Skull: Left orbital floor fracture with up to 5 mm of displacement. Left lamina papyracea fracture without significant depression. Sinuses/Orbits: Left orbital fractures as described above. Mild left orbital emphysema. Moderately large left periorbital hematoma. No retrobulbar hematoma. Small volume fluid/blood and mucosal thickening in the inferior left frontal, left ethmoid, and left maxillary sinuses. Clear mastoid air cells. Other: None. Critical Value/emergent results were called by telephone at the time of interpretation on 01/03/2022 at 8:42 am to Dr. Marye Round, who verbally acknowledged these results. IMPRESSION: 1. Small hemorrhagic contusion in the right frontal lobe. 2. Small amount of subarachnoid hemorrhage in the left sylvian fissure. 3. Fractures of the floor and medial wall of the left orbit. 4.  Chronic small vessel ischemic disease. Electronically Signed   By: Logan Bores M.D.   On: 01/03/2022 08:53   CT Cervical Spine Wo Contrast  Result Date: 01/03/2022 CLINICAL DATA:  Neck trauma, intoxicated or obtunded (Age >= 16y) EXAM: CT CERVICAL SPINE WITHOUT CONTRAST TECHNIQUE: Multidetector CT imaging of the cervical spine was performed without intravenous contrast. Multiplanar CT image reconstructions were also generated. RADIATION DOSE REDUCTION: This exam was performed according to the departmental dose-optimization program which includes automated exposure control, adjustment of the mA and/or kV according to patient size and/or use of iterative reconstruction technique. COMPARISON:  None Available. FINDINGS: Alignment: Physiologic. Skull base and vertebrae: There is no evidence of acute cervical spine fracture. Degenerative endplate changes worst at C5-C6. No aggressive osseous lesion. There is nonspecific osteosclerosis of the posterior elements and posterior aspects of the vertebral bodies, present throughout the spine as seen on multiple prior CTs dating back to September 9211, likely metabolic. Soft tissues and spinal canal: No prevertebral fluid or swelling. No visible canal hematoma. Disc levels: There is moderate degenerative disc disease C5-C6. Posterior disc osteophyte complex at C5-C6 and C6-C7 results in no significant stenosis. Upper chest: Negative. Other: Subcentimeter hypodense right thyroid nodule requires no follow-up imaging. IMPRESSION: No evidence of acute cervical spine fracture. Electronically Signed   By: Maurine Simmering M.D.   On: 01/03/2022 08:45    EKG: Independently reviewed.  Sinus arrhythmia, no acute ST changes.  Assessment/Plan Principal Problem:   Hypoglycemia Active Problems:   Uncontrolled type 2 diabetes mellitus with hypoglycemia, with long-term current use of insulin ([place])  (please populate well all problems here in Problem List. (For example, if patient is on  BP meds at home and you resume or decide to hold them, it is a problem that needs to be her. Same for CAD, COPD, HLD and so on)  Acute metabolic encephalopathy -Secondary to persistent hypoglycemia -Received multiple D50 pushes, and D10 drip started in the ED however still has poor control hyperglycemia, likely from Lantus. -1 dose of glucagon IM given -Frequent fingerstick check, discussed with ED nurse. -A1c,, most  likely will need cut down the doses of Lantus or even discontiue Lantus on discharge.  Small SAH on left sylvian fissure and right frontal lobe hemorrhagic contusion -Neurosurgery reviewed the case and recommended conservative management, will need more stringent BP control.  Add as needed hydralazine, resume home BP meds as soon as able to take p.o. -Nimodinpine for 3 weeks -Frequent neurologic checks -Hold off chemical DVT prophylaxis  Left orbital floor and medial wall fracture -ENT will see patient emergently -Patient able to open his eyes and move left eye freely, will reevaluate in next few hours -Ice pack  HTN, uncontrolled -As needed hydralazine for now, resume home BP meds ASAP.  Gouty flare -On left elbow, was just started on Prednisone 4 days ago, will continue -On allopurinol  AKI on CKD stage IIIa -Check uric acid, check renal ultrasound and CK -Appears to be euvolemic, on IV fluid  Obesity -BMI=38, will discuss with patient regarding calorie control  DVT prophylaxis: SCD Code Status: Full code Family Communication: None at bedside Disposition Plan: Expect less than 2 midnight hospital stay Consults called: None Admission status: Tele obs   Lequita Halt MD [facility] Pager [phone]  01/03/2022, 10:53 AM

## 2022-01-03 NOTE — ED Notes (Signed)
Unable to draw lab RN aware

## 2022-01-03 NOTE — ED Notes (Signed)
Pt returned from CT °

## 2022-01-03 NOTE — ED Notes (Signed)
Checked patient blood sugar it was 32 notified RN Hannie of blood sugar patient is resting with call bell in reach

## 2022-01-03 NOTE — ED Notes (Signed)
Cbg 22; Dr. Dina Rich notified; 1 amp d50 given per verbal order; d10 infusion increased to 157ml/h per verbal order; pt responding to voice at this time

## 2022-01-03 NOTE — ED Notes (Signed)
Patient transported to CT 

## 2022-01-03 NOTE — ED Notes (Signed)
1 amp d50 given by Donia Guiles, RN; Dr. Roosevelt Locks and Dr. Tomi Bamberger notified of hypoglycemia; pt alert to self and place; pt given food and beverage per Dr. Roosevelt Locks; will continue to monitor as able

## 2022-01-03 NOTE — ED Triage Notes (Signed)
BIBA from home, unwitnessed fall. Roommate found patient on the floor lying face first. Patient was groaning/not oriented upon EMS arrival.  Initial CBG=48, gave 25g D10. CBG= 190. Patient presents with laceration to L eyebrow and lip in c-collar.   Prior hx of DM. Does not take blood thinners.

## 2022-01-03 NOTE — ED Provider Notes (Signed)
Patient initially seen by to [place].  Please see his note.  Patient on the ground after a fall.  Patient also has been hypoglycemic.  Notified by Dr. Jeralyn Ruths that the patient has a small subarachnoid hemorrhage as well as a fracture in the orbital floor.   CT c spine images and report reviewed.  No acute fx.  8:53 AM findings discussed with patient.  Amnestic to some of the events but is awake and alert answering questions appropriately  REviewed case with Drr Nundkumar.  Hemorrhage is small and should resolve.  No need for repeat imaging.  Case reviewed with Dr Roosevelt Locks regarding admission.  Reviewed case with Dr Janace Hoard ENT regarding his orbital fractures.  He will review and consult.   Dorie Rank, MD 01/03/22 [phone]

## 2022-01-03 NOTE — Progress Notes (Addendum)
Inpatient Diabetes Program Recommendations  AACE/ADA: New Consensus Statement on Inpatient Glycemic Control (2015)  Target Ranges:  Prepandial:   less than 140 mg/dL      Peak postprandial:   less than 180 mg/dL (1-2 hours)      Critically ill patients:  140 - 180 mg/dL   Lab Results  Component Value Date   GLUCAP 117 (H) 01/03/2022   HGBA1C 8.2 (H) 12/31/2020    Review of Glycemic Control  Latest Reference Range & Units 01/03/22 06:40 01/03/22 07:21 01/03/22 08:06 01/03/22 10:25 01/03/22 11:44  Glucose-Capillary 70 - 99 mg/dL 45 (L) 22 (LL) 76 32 (LL) 117 (H)   Diabetes history: DM  Outpatient Diabetes medications:  Amaryl 4 mg daily Novolog 4 units tid with meals Lantus 25 units bid Jardiance 25 mg daily Inpatient Diabetes Program Recommendations:    Note patient admitted with low blood sugars. Will need close monitoring q 1 hour.  Will follow.   Thanks,  Adah Perl, RN, BC-ADM Inpatient Diabetes Coordinator Pager [phone]  (8a-5p)

## 2022-01-03 NOTE — ED Provider Notes (Signed)
..  Laceration Repair  Date/Time: 01/03/2022 8:07 AM  Performed by: Domenic Moras, PA-C Authorized by: Domenic Moras, PA-C   Consent:    Consent obtained:  Verbal   Consent given by:  Patient   Risks, benefits, and alternatives were discussed: yes     Risks discussed:  Infection, nerve damage, poor wound healing and need for additional repair   Alternatives discussed:  No treatment Universal protocol:    Procedure explained and questions answered to patient or proxy's satisfaction: yes     Relevant documents present and verified: yes     Test results available: yes     Imaging studies available: yes     Patient identity confirmed:  Verbally with patient and arm band Anesthesia:    Anesthesia method:  Local infiltration   Local anesthetic:  Lidocaine 2% WITH epi Laceration details:    Location:  Face   Face location:  L upper eyelid   Extent:  Superficial   Length (cm):  5   Depth (mm):  3 Pre-procedure details:    Preparation:  Patient was prepped and draped in usual sterile fashion and imaging obtained to evaluate for foreign bodies Exploration:    Limited defect created (wound extended): no     Hemostasis achieved with:  Epinephrine   Imaging outcome: foreign body not noted     Wound exploration: wound explored through full range of motion and entire depth of wound visualized     Contaminated: no   Treatment:    Area cleansed with:  Povidone-iodine   Amount of cleaning:  Standard   Irrigation solution:  Sterile saline   Irrigation method:  Pressure wash   Visualized foreign bodies/material removed: no     Debridement:  None   Undermining:  None   Scar revision: no   Skin repair:    Repair method:  Sutures   Suture size:  5-0   Wound skin closure material used: ethilon.   Suture technique:  Simple interrupted   Number of sutures:  5 Approximation:    Approximation:  Close Repair type:    Repair type:  Simple Post-procedure details:    Dressing:  Non-adherent  dressing   Procedure completion:  Tolerated well, no immediate complications     Domenic Moras, PA-C 01/03/22 3614    Dorie Rank, MD 01/03/22 1228

## 2022-01-03 NOTE — ED Notes (Signed)
Pt provided with another 8 oz of juice at this time.

## 2022-01-03 NOTE — Progress Notes (Signed)
Finger stick 44, will resume D10 at 30 cc/HR

## 2022-01-03 NOTE — Consult Note (Signed)
Reason for Consult:orbital fracture Referring Physician: Dr Delbert Phenix is an 56 y.o. male.  HPI: hx of fall and has a orbital fracture of the left orbit. He is with hypoglycemia. He has no subjectivediplopia. No significant visual changes.   Past Medical History:  Diagnosis Date   Anemia    Diabetes mellitus    Gout    History of COVID-19 04/2019   Hypertension    PVD (peripheral vascular disease) ([place])    s/p R BKA    Past Surgical History:  Procedure Laterality Date   AMPUTATION Right 06/06/2020   Procedure: AMPUTATION 5TH RAY;  Surgeon: [name], MD;  Location: [facility];  Service: Orthopedics;  Laterality: Right;   AMPUTATION Right 06/08/2020   Procedure: RIGHT 4TH RAY AMPUTATION;  Surgeon: [name], MD;  Location: [facility];  Service: Orthopedics;  Laterality: Right;   AMPUTATION Right 08/27/2020   Procedure: AMPUTATION BELOW KNEE;  Surgeon: Jessy Oto, MD;  Location: [facility];  Service: Orthopedics;  Laterality: Right;   AMPUTATION Left 12/30/2020   Procedure: LEFT BELOW KNEE AMPUTATION;  Surgeon: [name], MD;  Location: [facility];  Service: Orthopedics;  Laterality: Left;   CHOLECYSTECTOMY N/A 09/25/2020   Procedure: LAPAROSCOPIC CHOLECYSTECTOMY;  Surgeon: Jesusita Oka, MD;  [facility];  Service: General;  Laterality: N/A;    Family History  Problem Relation Age of Onset   Diabetes Neg Hx     Social History:  reports that he has never smoked. He has never used smokeless tobacco. He reports that he does not drink alcohol and does not use drugs.  Allergies: No Known Allergies  Medications: I have reviewed the patient's current medications.  Results for orders placed or performed during the hospital encounter of 01/03/22 (from the past 48 hour(s))  CBC with Differential     Status: Abnormal   Collection Time: 01/03/22  5:22 AM  Result Value Ref Range   WBC 10.2 4.0 - 10.5 K/uL   RBC 3.45 (L) 4.22 - 5.81 MIL/uL   Hemoglobin 9.4 (L) 13.0 - 17.0  g/dL   HCT 30.4 (L) 39.0 - 52.0 %   MCV 88.1 80.0 - 100.0 fL   MCH 27.2 26.0 - 34.0 pg   MCHC 30.9 30.0 - 36.0 g/dL   RDW 14.8 11.5 - 15.5 %   Platelets 411 (H) 150 - 400 K/uL   nRBC 0.2 0.0 - 0.2 %   Neutrophils Relative % 66 %   Neutro Abs 6.9 1.7 - 7.7 K/uL   Lymphocytes Relative 13 %   Lymphs Abs 1.3 0.7 - 4.0 K/uL   Monocytes Relative 15 %   Monocytes Absolute 1.5 (H) 0.1 - 1.0 K/uL   Eosinophils Relative 2 %   Eosinophils Absolute 0.2 0.0 - 0.5 K/uL   Basophils Relative 1 %   Basophils Absolute 0.1 0.0 - 0.1 K/uL   Immature Granulocytes 3 %   Abs Immature Granulocytes 0.28 (H) 0.00 - 0.07 K/uL    Comment: Performed at [facility], [address], [place], [place] [address]  Basic metabolic panel     Status: Abnormal   Collection Time: 01/03/22  5:22 AM  Result Value Ref Range   Sodium 140 135 - 145 mmol/L   Potassium 4.9 3.5 - 5.1 mmol/L   Chloride 109 98 - 111 mmol/L   CO2 15 (L) 22 - 32 mmol/L   Glucose, Bld 35 (LL) 70 - 99 mg/dL  Comment: CRITICAL RESULT CALLED TO, READ BACK BY AND VERIFIED WITH Andi Hence RN 01/03/22 0623 M KOROLESKI Glucose reference range applies only to samples taken after fasting for at least 8 hours.    BUN 56 (H) 6 - 20 mg/dL   Creatinine, Ser 3.56 (H) 0.61 - 1.24 mg/dL   Calcium 7.4 (L) 8.9 - 10.3 mg/dL   GFR, Estimated 19 (L) >60 mL/min    Comment: (NOTE) Calculated using the CKD-EPI Creatinine Equation (2021)    Anion gap 16 (H) 5 - 15    Comment: Performed at [facility] [address] [place], [place] [address]  Ethanol     Status: None   Collection Time: 01/03/22  5:22 AM  Result Value Ref Range   Alcohol, Ethyl (B) <10 <10 mg/dL    Comment: (NOTE) Lowest detectable limit for serum alcohol is 10 mg/dL.  For medical purposes only. Performed at [facility], [place] [address] [place], [place] [address]   POC CBG, ED     Status: Abnormal   Collection Time: 01/03/22  5:42 AM  Result Value Ref Range    Glucose-Capillary 22 (LL) 70 - 99 mg/dL    Comment: Glucose reference range applies only to samples taken after fasting for at least 8 hours.   Comment 1 Call MD NNP PA CNM   CBG monitoring, ED     Status: None   Collection Time: 01/03/22  6:02 AM  Result Value Ref Range   Glucose-Capillary 80 70 - 99 mg/dL    Comment: Glucose reference range applies only to samples taken after fasting for at least 8 hours.  CBG monitoring, ED     Status: Abnormal   Collection Time: 01/03/22  6:40 AM  Result Value Ref Range   Glucose-Capillary 45 (L) 70 - 99 mg/dL    Comment: Glucose reference range applies only to samples taken after fasting for at least 8 hours.  Lactic acid, plasma     Status: None   Collection Time: 01/03/22  7:00 AM  Result Value Ref Range   Lactic Acid, Venous 0.8 0.5 - 1.9 mmol/L    Comment: Performed at [facility] [address] [place], [place] [address]  TSH     Status: None   Collection Time: 01/03/22  7:05 AM  Result Value Ref Range   TSH 1.257 0.350 - 4.500 uIU/mL    Comment: Performed by a 3rd Generation assay with a functional sensitivity of <=0.01 uIU/mL. Performed at [facility], [place] [address] [place], [place] [address]   Hepatic function panel     Status: Abnormal   Collection Time: 01/03/22  7:05 AM  Result Value Ref Range   Total Protein 6.4 (L) 6.5 - 8.1 g/dL   Albumin 3.0 (L) 3.5 - 5.0 g/dL   AST 37 15 - 41 U/L   ALT 20 0 - 44 U/L   Alkaline Phosphatase 86 38 - 126 U/L   Total Bilirubin 0.3 0.3 - 1.2 mg/dL   Bilirubin, Direct 0.1 0.0 - 0.2 mg/dL   Indirect Bilirubin 0.2 (L) 0.3 - 0.9 mg/dL    Comment: Performed at [facility] [address], [place], [place] [address]  POC CBG, ED     Status: Abnormal   Collection Time: 01/03/22  7:21 AM  Result Value Ref Range   Glucose-Capillary 22 (LL) 70 - 99 mg/dL    Comment: Glucose reference range applies only to samples taken  after fasting for at least 8 hours.  CBG monitoring, ED      Status: None   Collection Time: 01/03/22  8:06 AM  Result Value Ref Range   Glucose-Capillary 76 70 - 99 mg/dL    Comment: Glucose reference range applies only to samples taken after fasting for at least 8 hours.  Rapid urine drug screen (hospital performed)     Status: None   Collection Time: 01/03/22  8:43 AM  Result Value Ref Range   Opiates NONE DETECTED NONE DETECTED   Cocaine NONE DETECTED NONE DETECTED   Benzodiazepines NONE DETECTED NONE DETECTED   Amphetamines NONE DETECTED NONE DETECTED   Tetrahydrocannabinol NONE DETECTED NONE DETECTED   Barbiturates NONE DETECTED NONE DETECTED    Comment: (NOTE) DRUG SCREEN FOR MEDICAL PURPOSES ONLY.  IF CONFIRMATION IS NEEDED FOR ANY PURPOSE, NOTIFY LAB WITHIN 5 DAYS.  LOWEST DETECTABLE LIMITS FOR URINE DRUG SCREEN Drug Class                     Cutoff (ng/mL) Amphetamine and metabolites    1000 Barbiturate and metabolites    200 Benzodiazepine                 200 Opiates and metabolites        300 Cocaine and metabolites        300 THC                            50 Performed at [facility], [place] [address] [place], [place] [address]   POC CBG, ED     Status: Abnormal   Collection Time: 01/03/22 10:25 AM  Result Value Ref Range   Glucose-Capillary 32 (LL) 70 - 99 mg/dL    Comment: Glucose reference range applies only to samples taken after fasting for at least 8 hours.   Comment 1 Notify RN    Comment 2 Document in Chart   POC CBG, ED     Status: Abnormal   Collection Time: 01/03/22 11:44 AM  Result Value Ref Range   Glucose-Capillary 117 (H) 70 - 99 mg/dL    Comment: Glucose reference range applies only to samples taken after fasting for at least 8 hours.    US RENAL  Result Date: 01/03/2022 CLINICAL DATA:  Acute kidney injury EXAM: RENAL / URINARY TRACT ULTRASOUND COMPLETE COMPARISON:  CT renal stone dated 09/22/2020 FINDINGS: Right Kidney: Length = 10.7 cm AP renal pelvis diameter = <10 mm Mildly increased  cortical echogenicity. No urinary tract dilation or shadowing calculi. The ureter is not seen. Left Kidney: Length = 9.6 cm AP renal pelvis diameter = <10 mm Normal parenchymal echogenicity with preserved corticomedullary differentiation. No urinary tract dilation or shadowing calculi. The ureter is not seen. Bladder: Appears normal for degree of bladder distention. Other: Technically challenging examination due to patient body habitus. IMPRESSION: 1. No urinary tract dilation or shadowing calculi. 2. Mildly increased right renal cortical echogenicity can be seen in the setting of medical renal disease. Electronically Signed   By: Darrin Nipper M.D.   On: 01/03/2022 11:41   CT Head Wo Contrast  Result Date: 01/03/2022 CLINICAL DATA:  Head trauma, moderate-severe.  Fall. EXAM: CT HEAD WITHOUT CONTRAST TECHNIQUE: Contiguous axial images were obtained from the base of the skull through the vertex without intravenous contrast. RADIATION DOSE REDUCTION: This exam was performed according to the departmental dose-optimization program which includes automated  exposure control, adjustment of the mA and/or kV according to patient size and/or use of iterative reconstruction technique. COMPARISON:  Head CT and MRI 11/08/2021 FINDINGS: Brain: A 5 mm focus of acute hemorrhage superficially in the anterolateral right frontal lobe is compatible with a small hemorrhagic contusion with minimal surrounding edema. There is a small amount of subarachnoid hemorrhage in the left sylvian fissure. No acute infarct, midline shift, or extra-axial fluid collection is identified. The ventricles are normal in size. Patchy hypodensities in the cerebral white matter bilaterally are unchanged and nonspecific but compatible with moderately age advanced chronic small vessel ischemic disease. Vascular: No definite hyperdense vessel within limitations of acute hemorrhage. Skull: Left orbital floor fracture with up to 5 mm of displacement. Left lamina  papyracea fracture without significant depression. Sinuses/Orbits: Left orbital fractures as described above. Mild left orbital emphysema. Moderately large left periorbital hematoma. No retrobulbar hematoma. Small volume fluid/blood and mucosal thickening in the inferior left frontal, left ethmoid, and left maxillary sinuses. Clear mastoid air cells. Other: None. Critical Value/emergent results were called by telephone at the time of interpretation on 01/03/2022 at 8:42 am to Dr. Marye Round, who verbally acknowledged these results. IMPRESSION: 1. Small hemorrhagic contusion in the right frontal lobe. 2. Small amount of subarachnoid hemorrhage in the left sylvian fissure. 3. Fractures of the floor and medial wall of the left orbit. 4. Chronic small vessel ischemic disease. Electronically Signed   By: Logan Bores M.D.   On: 01/03/2022 08:53   CT Cervical Spine Wo Contrast  Result Date: 01/03/2022 CLINICAL DATA:  Neck trauma, intoxicated or obtunded (Age >= 16y) EXAM: CT CERVICAL SPINE WITHOUT CONTRAST TECHNIQUE: Multidetector CT imaging of the cervical spine was performed without intravenous contrast. Multiplanar CT image reconstructions were also generated. RADIATION DOSE REDUCTION: This exam was performed according to the departmental dose-optimization program which includes automated exposure control, adjustment of the mA and/or kV according to patient size and/or use of iterative reconstruction technique. COMPARISON:  None Available. FINDINGS: Alignment: Physiologic. Skull base and vertebrae: There is no evidence of acute cervical spine fracture. Degenerative endplate changes worst at C5-C6. No aggressive osseous lesion. There is nonspecific osteosclerosis of the posterior elements and posterior aspects of the vertebral bodies, present throughout the spine as seen on multiple prior CTs dating back to September 3570, likely metabolic. Soft tissues and spinal canal: No prevertebral fluid or swelling. No  visible canal hematoma. Disc levels: There is moderate degenerative disc disease C5-C6. Posterior disc osteophyte complex at C5-C6 and C6-C7 results in no significant stenosis. Upper chest: Negative. Other: Subcentimeter hypodense right thyroid nodule requires no follow-up imaging. IMPRESSION: No evidence of acute cervical spine fracture. Electronically Signed   By: Maurine Simmering M.D.   On: 01/03/2022 08:45    ROS Blood pressure (!) 162/80, pulse 80, temperature (!) 97.5 F (36.4 C), temperature source Oral, resp. rate 12, height 6\' 2"  (1.88 m), weight 136.1 kg, SpO2 100 %. Physical Exam HENT:     Head: Normocephalic.     Nose: Nose normal.     Mouth/Throat:     Mouth: Mucous membranes are moist.  Eyes:     Comments: He has good clear vision. He has no evidence by observation of limitation of eye movement. He says he has diplopia in the upward gaze initially then after immediate retest he had only left lateral gaze diplopia no upward. The eye does not have excessive swelling or conjunctival hemorrhage or swelling.   Neurological:  Mental Status: He is alert.       Assessment/Plan: Left orbital fracture- he does not appear to have enough bone defect to have significant risk of enopthalmus. He has diplopia in the left lateral gaze and vision is grossly ok. Ophthalmology consult would be helpful. He can follow up with [phone] in about 1 week  Come back to ER or immediately see eye doc if any vision changes or worsening symptoms.    Melissa Montane 01/03/2022, 12:11 PM

## 2022-01-04 DIAGNOSIS — I129 Hypertensive chronic kidney disease with stage 1 through stage 4 chronic kidney disease, or unspecified chronic kidney disease: Secondary | ICD-10-CM | POA: Diagnosis present

## 2022-01-04 DIAGNOSIS — Z8616 Personal history of COVID-19: Secondary | ICD-10-CM | POA: Diagnosis not present

## 2022-01-04 DIAGNOSIS — N179 Acute kidney failure, unspecified: Secondary | ICD-10-CM | POA: Diagnosis present

## 2022-01-04 DIAGNOSIS — G9341 Metabolic encephalopathy: Secondary | ICD-10-CM | POA: Diagnosis present

## 2022-01-04 DIAGNOSIS — E1122 Type 2 diabetes mellitus with diabetic chronic kidney disease: Secondary | ICD-10-CM | POA: Diagnosis present

## 2022-01-04 DIAGNOSIS — Z1152 Encounter for screening for COVID-19: Secondary | ICD-10-CM | POA: Diagnosis not present

## 2022-01-04 DIAGNOSIS — S066XAA Traumatic subarachnoid hemorrhage with loss of consciousness status unknown, initial encounter: Secondary | ICD-10-CM | POA: Diagnosis present

## 2022-01-04 DIAGNOSIS — S01112A Laceration without foreign body of left eyelid and periocular area, initial encounter: Secondary | ICD-10-CM | POA: Diagnosis present

## 2022-01-04 DIAGNOSIS — N1831 Chronic kidney disease, stage 3a: Secondary | ICD-10-CM | POA: Diagnosis present

## 2022-01-04 DIAGNOSIS — Z89511 Acquired absence of right leg below knee: Secondary | ICD-10-CM | POA: Diagnosis not present

## 2022-01-04 DIAGNOSIS — S0232XA Fracture of orbital floor, left side, initial encounter for closed fracture: Secondary | ICD-10-CM | POA: Diagnosis present

## 2022-01-04 DIAGNOSIS — S02832A Fracture of medial orbital wall, left side, initial encounter for closed fracture: Secondary | ICD-10-CM | POA: Diagnosis present

## 2022-01-04 DIAGNOSIS — E162 Hypoglycemia, unspecified: Secondary | ICD-10-CM | POA: Diagnosis present

## 2022-01-04 DIAGNOSIS — Z794 Long term (current) use of insulin: Secondary | ICD-10-CM | POA: Diagnosis not present

## 2022-01-04 DIAGNOSIS — Z6838 Body mass index (BMI) 38.0-38.9, adult: Secondary | ICD-10-CM | POA: Diagnosis not present

## 2022-01-04 DIAGNOSIS — E11649 Type 2 diabetes mellitus with hypoglycemia without coma: Secondary | ICD-10-CM | POA: Diagnosis present

## 2022-01-04 DIAGNOSIS — Z79899 Other long term (current) drug therapy: Secondary | ICD-10-CM | POA: Diagnosis not present

## 2022-01-04 DIAGNOSIS — W19XXXA Unspecified fall, initial encounter: Secondary | ICD-10-CM | POA: Diagnosis present

## 2022-01-04 DIAGNOSIS — D649 Anemia, unspecified: Secondary | ICD-10-CM | POA: Diagnosis present

## 2022-01-04 DIAGNOSIS — E669 Obesity, unspecified: Secondary | ICD-10-CM | POA: Diagnosis present

## 2022-01-04 DIAGNOSIS — Z7985 Long-term (current) use of injectable non-insulin antidiabetic drugs: Secondary | ICD-10-CM | POA: Diagnosis not present

## 2022-01-04 DIAGNOSIS — E1151 Type 2 diabetes mellitus with diabetic peripheral angiopathy without gangrene: Secondary | ICD-10-CM | POA: Diagnosis present

## 2022-01-04 DIAGNOSIS — M109 Gout, unspecified: Secondary | ICD-10-CM | POA: Diagnosis present

## 2022-01-04 DIAGNOSIS — M25522 Pain in left elbow: Secondary | ICD-10-CM | POA: Diagnosis present

## 2022-01-04 DIAGNOSIS — S062XAA Diffuse traumatic brain injury with loss of consciousness status unknown, initial encounter: Secondary | ICD-10-CM | POA: Diagnosis present

## 2022-01-04 DIAGNOSIS — Z89512 Acquired absence of left leg below knee: Secondary | ICD-10-CM | POA: Diagnosis not present

## 2022-01-04 LAB — BASIC METABOLIC PANEL
Anion gap: 9 (ref 5–15)
BUN: 53 mg/dL — ABNORMAL HIGH (ref 6–20)
CO2: 17 mmol/L — ABNORMAL LOW (ref 22–32)
Calcium: 7 mg/dL — ABNORMAL LOW (ref 8.9–10.3)
Chloride: 114 mmol/L — ABNORMAL HIGH (ref 98–111)
Creatinine, Ser: 3.26 mg/dL — ABNORMAL HIGH (ref 0.61–1.24)
GFR, Estimated: 22 mL/min — ABNORMAL LOW (ref >60)
Glucose, Bld: 159 mg/dL — ABNORMAL HIGH (ref 70–99)
Potassium: 5.5 mmol/L — ABNORMAL HIGH (ref 3.5–5.1)
Sodium: 140 mmol/L (ref 135–145)

## 2022-01-04 LAB — GLUCOSE, CAPILLARY
Glucose-Capillary: 147 mg/dL — ABNORMAL HIGH (ref 70–99)
Glucose-Capillary: 151 mg/dL — ABNORMAL HIGH (ref 70–99)
Glucose-Capillary: 153 mg/dL — ABNORMAL HIGH (ref 70–99)
Glucose-Capillary: 174 mg/dL — ABNORMAL HIGH (ref 70–99)
Glucose-Capillary: 201 mg/dL — ABNORMAL HIGH (ref 70–99)
Glucose-Capillary: 276 mg/dL — ABNORMAL HIGH (ref 70–99)
Glucose-Capillary: 393 mg/dL — ABNORMAL HIGH (ref 70–99)

## 2022-01-04 LAB — URIC ACID: Uric Acid, Serum: 9 mg/dL — ABNORMAL HIGH (ref 3.7–8.6)

## 2022-01-04 LAB — HIV ANTIBODY (ROUTINE TESTING W REFLEX): HIV Screen 4th Generation wRfx: NONREACTIVE

## 2022-01-04 LAB — CBC
HCT: 25.6 % — ABNORMAL LOW (ref 39.0–52.0)
Hemoglobin: 8 g/dL — ABNORMAL LOW (ref 13.0–17.0)
MCH: 27.3 pg (ref 26.0–34.0)
MCHC: 31.3 g/dL (ref 30.0–36.0)
MCV: 87.4 fL (ref 80.0–100.0)
Platelets: 354 10*3/uL (ref 150–400)
RBC: 2.93 MIL/uL — ABNORMAL LOW (ref 4.22–5.81)
RDW: 14.8 % (ref 11.5–15.5)
WBC: 8 10*3/uL (ref 4.0–10.5)
nRBC: 0 % (ref 0.0–0.2)

## 2022-01-04 LAB — HEMOGLOBIN A1C
Hgb A1c MFr Bld: 6.7 % — ABNORMAL HIGH (ref 4.8–5.6)
Mean Plasma Glucose: 145.59 mg/dL

## 2022-01-04 LAB — C-PEPTIDE: C-Peptide: 17.2 ng/mL — ABNORMAL HIGH (ref 1.1–4.4)

## 2022-01-04 LAB — CK: Total CK: 908 U/L — ABNORMAL HIGH (ref 49–397)

## 2022-01-04 NOTE — TOC Initial Note (Signed)
Transition of Care [facility]) - Initial/Assessment Note    Patient Details  Name: Patrick [name] MRN: [id number] Date of Birth: 1965/03/19  [facility] CM/SW Contact:    Pollie Friar, RN Phone Number: 01/04/2022, 10:53 AM  Clinical Narrative:                 Pt is from home with roommates. Pt states they are there most of the time and can provide support at home.  Pt drives self as needed and manages his own medications without issues.  Pt states he has a friend that will provide transport home when medically ready for d/c.   Expected Discharge Plan: Home/Self Care Barriers to Discharge: Continued Medical Work up   Patient Goals and CMS Choice        Expected Discharge Plan and Services Expected Discharge Plan: Home/Self Care       Living arrangements for the past 2 months: Single Family Home                                      Prior Living Arrangements/Services Living arrangements for the past 2 months: Single Family Home Lives with:: Roommate Patient language and need for interpreter reviewed:: Yes Do you feel safe going back to the place where you live?: Yes          Current home services: DME (bilateral prosthesis/ wheelchair/ walker) Criminal Activity/Legal Involvement Pertinent to Current Situation/Hospitalization: No - Comment as needed  Activities of Daily Living Home Assistive Devices/Equipment: Prosthesis ADL Screening (condition at time of admission) Patient's cognitive ability adequate to safely complete daily activities?: Yes Is the patient deaf or have difficulty hearing?: No Does the patient have difficulty seeing, even when wearing glasses/contacts?: No Does the patient have difficulty concentrating, remembering, or making decisions?: No Patient able to express need for assistance with ADLs?: Yes Does the patient have difficulty dressing or bathing?: No Independently performs ADLs?: Yes (appropriate for developmental age) Does  the patient have difficulty walking or climbing stairs?: No Weakness of Legs: None Weakness of Arms/Hands: None  Permission Sought/Granted                  Emotional Assessment Appearance:: Appears stated age Attitude/Demeanor/Rapport: Engaged Affect (typically observed): Accepting Orientation: : Oriented to Self, Oriented to Place, Oriented to  Time, Oriented to Situation   Psych Involvement: No (comment)  Admission diagnosis:  Hypoglycemia [E16.2] Laceration of left eyebrow, initial encounter [S01.112A] Fall, initial encounter [W19.XXXA] Patient Active Problem List   Diagnosis Date Noted   Hypoglycemia 01/03/2022   Left below-knee amputee ([place]) 12/30/2020   Subacute osteomyelitis, left ankle and foot ([place])    Cholelithiasis with acute cholecystitis 09/25/2020   Hypertensive urgency 09/25/2020   Constipation 09/25/2020   Thrombocytosis 09/25/2020   S/P BKA (below knee amputation) unilateral, right ([place]) 09/01/2020   ABLA (acute blood loss anemia) 09/01/2020   Anemia 08/27/2020   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 ([place]) 07/20/2020   Dehiscence of amputation stump ([place]) 07/20/2020   Cutaneous abscess of right foot    Osteomyelitis of right foot ([facility])    Severe protein-calorie malnutrition ([place])    Sepsis due to cellulitis ([place]) 06/04/2020   Diabetic foot ulcer ([place]) 06/04/2020   Diabetic foot infection ([place]) 06/04/2020   CKD (chronic kidney disease) stage 3, GFR 30-59 ml/min ([place]) 06/04/2020   Gas gangrene  of foot ([place]) 06/04/2020   Acute respiratory disease due to COVID-19 virus 05/03/2019   Renal insufficiency 05/03/2019   GOUT 01/27/2010   OBESITY 01/27/2010   Uncontrolled type 2 diabetes mellitus with hypoglycemia, with long-term current use of insulin ([place]) 02/20/2008   HYPERTENSION, BENIGN ESSENTIAL 02/20/2008   PCP:  [facility]:   [place] - [address] [place] [address] Phone: [phone] Fax: [place], [place] [address] Phone: [phone] Fax: [phone]     Social Determinants of Health (SDOH) Interventions    Readmission Risk Interventions    01/10/2021   11:02 AM 08/29/2020   12:23 PM  Readmission Risk Prevention Plan  Transportation Screening Complete Complete  PCP or Specialist Appt within 3-5 Days  Complete  HRI or Home Care Consult  Complete  Social Work Consult for [place] Planning/Counseling  Complete  Palliative Care Screening  Not Applicable  Medication Review Press photographer) Complete Complete  PCP or Specialist appointment within 3-5 days of discharge Complete   HRI or [place] Complete   SW Recovery Care/Counseling Consult Complete   Palliative Care Screening Not Applicable   Skilled Nursing Facility Complete

## 2022-01-04 NOTE — Progress Notes (Signed)
PROGRESS NOTE    Patrick [name]  YSA:[id number] DOB: [birth year] DOA: 01/03/2022 PCP: [facility]   Brief Narrative:  Patrick [name] is a 56 y.o. male with medical history significant of HTN, IDDM, PVD with lateral BKA, gout, obesity, CKD stage IIIa, came with fall and AMS.   Patient admitted in the setting of acute mental status changes, fall with profound hypoglycemia.  ENT evaluated orbital fracture of the left orbit with no indication for intervention at this time - recommending ophthalmology evaluation for diplopia.  Assessment & Plan:   Principal Problem:   Hypoglycemia Active Problems:   Uncontrolled type 2 diabetes mellitus with hypoglycemia, with long-term current use of insulin (HCC)  Acute metabolic encephalopathy Profound hypoglycemia -Likely secondary to home Lantus -Continue IV fluids with D10/D5 as indicated -Advance diet as tolerated -Continue close monitoring of point-of-care glucose -A1C 6.7 - C-peptide elevated    Small SAH on left sylvian fissure and right frontal lobe hemorrhagic contusion -Neurosurgery reviewed the case and recommended conservative management, will need more stringent BP control.   -Nimodinpine for 3 weeks per NeuroSx -Continue home antihypertensives -Frequent neurologic checks -Hold off chemical DVT prophylaxis   Left orbital floor and medial wall fracture Secondary to traumatic fall as above -ENT - recommend outpatient follow up - to call [phone] in about 1 week  -Diplopia noted - ophthalmology follow up outpatient   HTN, uncontrolled -As needed hydralazine for now, resume home BP meds ASAP.   Gout, questionably acute flare -On left elbow, was just started on Prednisone 4 days ago, will continue -On allopurinol -Uric acid level 9, appears to be his baseline   AKI on CKD stage IIIa -Continue IV fluids, increase p.o. intake as appropriate   Obesity -BMI=38, will discuss with patient regarding calorie control   DVT  prophylaxis: Holding given above Code Status: Full Family Communication: None present  Status is: Inpatient  Dispo: The patient is from: Home              Anticipated d/c is to: Home              Anticipated d/c date is: 24 to 48 hours              Patient currently not medically stable for discharge  Consultants:  ENT  Procedures:  None  Antimicrobials:  None  Subjective: No acute issues or events overnight denies nausea vomiting diarrhea constipation headache fevers chills chest pain  Objective: Vitals:   01/03/22 2000 01/03/22 2050 01/03/22 2313 01/04/22 0338  BP: (!) 162/92 (!) 172/96 (!) 158/81 (!) 158/92  Pulse: 81 74 79 70  Resp: 15 16 18 18   Temp:  98.7 F (37.1 C) 99.1 F (37.3 C) 98.1 F (36.7 C)  TempSrc:  Oral Oral   SpO2: 100% 99% 100% 100%  Weight:      Height:        Intake/Output Summary (Last 24 hours) at 01/04/2022 0731 Last data filed at 01/04/2022 0200 Gross per 24 hour  Intake 384.84 ml  Output 700 ml  Net -315.16 ml   Filed Weights   01/03/22 0523  Weight: 136.1 kg    Examination:  General:  Pleasantly resting in bed, No acute distress.  Somnolent but easily arousable. HEENT: Orbital ecchymosis and lip laceration noted.  Sclerae nonicteric, noninjected. Neck:  Without mass or deformity.  Trachea is midline. Lungs:  Clear to auscultate bilaterally without rhonchi, wheeze, or rales. Heart:  Regular rate and rhythm.  Without murmurs, rubs, or gallops. Abdomen:  Soft, nontender, nondistended.  Without guarding or rebound. Extremities: Bilateral BKA Vascular:  Dorsalis pedis and posterior tibial pulses palpable bilaterally. Skin:  Warm and dry, no erythema, no ulcerations.  Data Reviewed: I have personally reviewed following labs and imaging studies  CBC: Recent Labs  Lab 12/28/21 2352 01/03/22 0522 01/04/22 0438  WBC 11.7* 10.2 8.0  NEUTROABS 8.5* 6.9  --   HGB 8.4* 9.4* 8.0*  HCT 27.1* 30.4* 25.6*  MCV 87.1 88.1 87.4  PLT  371 411* 263   Basic Metabolic Panel: Recent Labs  Lab 12/28/21 2352 01/03/22 0522 01/04/22 0438  NA 135 140 140  K 4.6 4.9 5.5*  CL 105 109 114*  CO2 18* 15* 17*  GLUCOSE 236* 35* 159*  BUN 39* 56* 53*  CREATININE 2.98* 3.56* 3.26*  CALCIUM 7.3* 7.4* 7.0*   GFR: Estimated Creatinine Clearance: 37.6 mL/min (A) (by C-G formula based on SCr of 3.26 mg/dL (H)). Liver Function Tests: Recent Labs  Lab 01/03/22 0705  AST 37  ALT 20  ALKPHOS 86  BILITOT 0.3  PROT 6.4*  ALBUMIN 3.0*   No results for input(s): "LIPASE", "AMYLASE" in the last 168 hours. No results for input(s): "AMMONIA" in the last 168 hours. Coagulation Profile: No results for input(s): "INR", "PROTIME" in the last 168 hours. Cardiac Enzymes: Recent Labs  Lab 01/04/22 0438  CKTOTAL 908*   BNP (last 3 results) No results for input(s): "PROBNP" in the last 8760 hours. HbA1C: Recent Labs    01/04/22 0438  HGBA1C 6.7*   CBG: Recent Labs  Lab 01/03/22 2236 01/04/22 0012 01/04/22 0228 01/04/22 0405 01/04/22 0603  GLUCAP 182* 201* 153* 147* 151*   Lipid Profile: No results for input(s): "CHOL", "HDL", "LDLCALC", "TRIG", "CHOLHDL", "LDLDIRECT" in the last 72 hours. Thyroid Function Tests: Recent Labs    01/03/22 0705  TSH 1.257   Anemia Panel: No results for input(s): "VITAMINB12", "FOLATE", "FERRITIN", "TIBC", "IRON", "RETICCTPCT" in the last 72 hours. Sepsis Labs: Recent Labs  Lab 12/29/21 0200 12/29/21 0830 01/03/22 0700  LATICACIDVEN 1.0 0.8 0.8    Recent Results (from the past 240 hour(s))  Resp Panel by RT-PCR (Flu A&B, Covid) Anterior Nasal Swab     Status: None   Collection Time: 12/29/21  2:04 AM   Specimen: Anterior Nasal Swab  Result Value Ref Range Status   SARS Coronavirus 2 by RT PCR NEGATIVE NEGATIVE Final    Comment: (NOTE) SARS-CoV-2 target nucleic acids are NOT DETECTED.  The SARS-CoV-2 RNA is generally detectable in upper respiratory specimens during the acute  phase of infection. The lowest concentration of SARS-CoV-2 viral copies this assay can detect is 138 copies/mL. A negative result does not preclude SARS-Cov-2 infection and should not be used as the sole basis for treatment or other patient management decisions. A negative result may occur with  improper specimen collection/handling, submission of specimen other than nasopharyngeal swab, presence of viral mutation(s) within the areas targeted by this assay, and inadequate number of viral copies(<138 copies/mL). A negative result must be combined with clinical observations, patient history, and epidemiological information. The expected result is Negative.  Fact Sheet for Patients:  EntrepreneurPulse.com.au  Fact Sheet for Healthcare Providers:  IncredibleEmployment.be  This test is no t yet approved or cleared by the [place] FDA and  has been authorized for detection and/or diagnosis of SARS-CoV-2 by FDA under an Emergency Use Authorization (EUA). This EUA will remain  in effect (meaning this test  can be used) for the duration of the COVID-19 declaration under Section 564(b)(1) of the Act, 21 U.S.C.section 360bbb-3(b)(1), unless the authorization is terminated  or revoked sooner.       Influenza A by PCR NEGATIVE NEGATIVE Final   Influenza B by PCR NEGATIVE NEGATIVE Final    Comment: (NOTE) The Xpert Xpress SARS-CoV-2/FLU/RSV plus assay is intended as an aid in the diagnosis of influenza from Nasopharyngeal swab specimens and should not be used as a sole basis for treatment. Nasal washings and aspirates are unacceptable for Xpert Xpress SARS-CoV-2/FLU/RSV testing.  Fact Sheet for Patients: EntrepreneurPulse.com.au  Fact Sheet for Healthcare Providers: IncredibleEmployment.be  This test is not yet approved or cleared by the [place] FDA and has been authorized for detection and/or diagnosis of  SARS-CoV-2 by FDA under an Emergency Use Authorization (EUA). This EUA will remain in effect (meaning this test can be used) for the duration of the COVID-19 declaration under Section 564(b)(1) of the Act, 21 U.S.C. section 360bbb-3(b)(1), unless the authorization is terminated or revoked.  Performed at [facility], [address], [place],  [address]          Radiology Studies: US RENAL  Result Date: 01/03/2022 CLINICAL DATA:  Acute kidney injury EXAM: RENAL / URINARY TRACT ULTRASOUND COMPLETE COMPARISON:  CT renal stone dated 09/22/2020 FINDINGS: Right Kidney: Length = 10.7 cm AP renal pelvis diameter = <10 mm Mildly increased cortical echogenicity. No urinary tract dilation or shadowing calculi. The ureter is not seen. Left Kidney: Length = 9.6 cm AP renal pelvis diameter = <10 mm Normal parenchymal echogenicity with preserved corticomedullary differentiation. No urinary tract dilation or shadowing calculi. The ureter is not seen. Bladder: Appears normal for degree of bladder distention. Other: Technically challenging examination due to patient body habitus. IMPRESSION: 1. No urinary tract dilation or shadowing calculi. 2. Mildly increased right renal cortical echogenicity can be seen in the setting of medical renal disease. Electronically Signed   By: Darrin Nipper M.D.   On: 01/03/2022 11:41   CT Head Wo Contrast  Result Date: 01/03/2022 CLINICAL DATA:  Head trauma, moderate-severe.  Fall. EXAM: CT HEAD WITHOUT CONTRAST TECHNIQUE: Contiguous axial images were obtained from the base of the skull through the vertex without intravenous contrast. RADIATION DOSE REDUCTION: This exam was performed according to the departmental dose-optimization program which includes automated exposure control, adjustment of the mA and/or kV according to patient size and/or use of iterative reconstruction technique. COMPARISON:  Head CT and MRI 11/08/2021 FINDINGS: Brain: A 5 mm focus  of acute hemorrhage superficially in the anterolateral right frontal lobe is compatible with a small hemorrhagic contusion with minimal surrounding edema. There is a small amount of subarachnoid hemorrhage in the left sylvian fissure. No acute infarct, midline shift, or extra-axial fluid collection is identified. The ventricles are normal in size. Patchy hypodensities in the cerebral white matter bilaterally are unchanged and nonspecific but compatible with moderately age advanced chronic small vessel ischemic disease. Vascular: No definite hyperdense vessel within limitations of acute hemorrhage. Skull: Left orbital floor fracture with up to 5 mm of displacement. Left lamina papyracea fracture without significant depression. Sinuses/Orbits: Left orbital fractures as described above. Mild left orbital emphysema. Moderately large left periorbital hematoma. No retrobulbar hematoma. Small volume fluid/blood and mucosal thickening in the inferior left frontal, left ethmoid, and left maxillary sinuses. Clear mastoid air cells. Other: None. Critical Value/emergent results were called by telephone at the time of interpretation on 01/03/2022 at 8:42 am to  Dr. Marye Round, who verbally acknowledged these results. IMPRESSION: 1. Small hemorrhagic contusion in the right frontal lobe. 2. Small amount of subarachnoid hemorrhage in the left sylvian fissure. 3. Fractures of the floor and medial wall of the left orbit. 4. Chronic small vessel ischemic disease. Electronically Signed   By: Logan Bores M.D.   On: 01/03/2022 08:53   CT Cervical Spine Wo Contrast  Result Date: 01/03/2022 CLINICAL DATA:  Neck trauma, intoxicated or obtunded (Age >= 16y) EXAM: CT CERVICAL SPINE WITHOUT CONTRAST TECHNIQUE: Multidetector CT imaging of the cervical spine was performed without intravenous contrast. Multiplanar CT image reconstructions were also generated. RADIATION DOSE REDUCTION: This exam was performed according to the departmental  dose-optimization program which includes automated exposure control, adjustment of the mA and/or kV according to patient size and/or use of iterative reconstruction technique. COMPARISON:  None Available. FINDINGS: Alignment: Physiologic. Skull base and vertebrae: There is no evidence of acute cervical spine fracture. Degenerative endplate changes worst at C5-C6. No aggressive osseous lesion. There is nonspecific osteosclerosis of the posterior elements and posterior aspects of the vertebral bodies, present throughout the spine as seen on multiple prior CTs dating back to September 8676, likely metabolic. Soft tissues and spinal canal: No prevertebral fluid or swelling. No visible canal hematoma. Disc levels: There is moderate degenerative disc disease C5-C6. Posterior disc osteophyte complex at C5-C6 and C6-C7 results in no significant stenosis. Upper chest: Negative. Other: Subcentimeter hypodense right thyroid nodule requires no follow-up imaging. IMPRESSION: No evidence of acute cervical spine fracture. Electronically Signed   By: Maurine Simmering M.D.   On: 01/03/2022 08:45    Scheduled Meds:  allopurinol  100 mg Oral BID   amLODipine  5 mg Oral Daily   insulin aspart  0-9 Units Subcutaneous TID WC   metoprolol tartrate  12.5 mg Oral BID   niMODipine  60 mg Oral Q4H   predniSONE  40 mg Oral Daily   traZODone  100 mg Oral QHS   Continuous Infusions:  dextrose 5 % and 0.9% NaCl 100 mL/hr at 01/03/22 1600     LOS: 0 days   Time spent: 31min  Elisse Pennick C Leita Lindbloom, DO [facility]  If 7PM-7AM, please contact night-coverage [email or web address]  01/04/2022, 7:31 AM

## 2022-01-05 LAB — GLUCOSE, CAPILLARY: Glucose-Capillary: 192 mg/dL — ABNORMAL HIGH (ref 70–99)

## 2022-01-05 MED ORDER — BLOOD GLUCOSE MONITOR KIT: PACK | 0 refills | Status: DC

## 2022-01-05 MED ORDER — NIMODIPINE 30 MG PO CAPS: 60.0000 mg | ORAL_CAPSULE | ORAL | 0 refills | Status: DC

## 2022-01-05 NOTE — TOC Transition Note (Signed)
Transition of Care [facility]) - CM/SW Discharge Note   Patient Details  Name: DAVIDSON PALMIERI MRN: [id number] Date of Birth: 1965-12-27  Transition of Care [facility]) CM/SW Contact:  Pollie Friar, RN Phone Number: 01/05/2022, 9:28 AM   Clinical Narrative:    Pt discharging home with self care. No needs per TOC. Pt has transportation home.    Final next level of care: Home/Self Care Barriers to Discharge: No Barriers Identified   Patient Goals and CMS Choice        Discharge Placement                       Discharge Plan and Services                                     Social Determinants of Health (SDOH) Interventions     Readmission Risk Interventions    01/10/2021   11:02 AM 08/29/2020   12:23 PM  Readmission Risk Prevention Plan  Transportation Screening Complete Complete  PCP or Specialist Appt within 3-5 Days  Complete  HRI or [place]  Complete  Social Work Consult for [place] Planning/Counseling  Complete  Palliative Care Screening  Not Applicable  Medication Review [employer]) Complete Complete  PCP or Specialist appointment within 3-5 days of discharge Complete   HRI or [place] Complete   SW Recovery Care/Counseling Consult Complete   Palliative Care Screening Not [place] Complete

## 2022-01-05 NOTE — Discharge Summary (Signed)
Physician Discharge Summary  Patrick [name] PFX:[id number] DOB: [birth year] DOA: 01/03/2022  PCP: Beaulah Corin, [facility]  Admit date: 01/03/2022 Discharge date: 01/05/2022  Admitted From: Home Disposition:  Home  Recommendations for Outpatient Follow-up:  Follow up with PCP in 1-2 weeks Please obtain BMP/CBC in one week Please follow up on the following pending results:  Home Health:None  Equipment/Devices:None  Discharge Condition:Stable  CODE STATUS:Full  Diet recommendation: Low carb diabetic diet    Brief/Interim Summary: Patrick [name] is a 56 y.o. male with medical history significant of HTN, IDDM, PVD with lateral BKA, gout, obesity, CKD stage IIIa, came with fall and AMS.    Patient admitted in the setting of acute mental status changes, fall with profound hypoglycemia.  ENT evaluated orbital fracture of the left orbit with no indication for intervention at this time - recommending ophthalmology evaluation for diplopia which resolved while admitted.  Patient's glucose continued to rise appropriately while long acting insulin was held. Lengthy discussion with patient about need for appropriate diet and medication compliance. New glucometer prescribed - recommend ACHS glucose checks or continuous glucometer if able to be setup by PCP. No medication changes at the time. A1C 6.7 shows moderately well controlled glucose on average. Concern for overnight hypoglycemia - recommend night time check and snack if necessary.  Discharge Diagnoses:  Principal Problem:   Hypoglycemia Active Problems:   Uncontrolled type 2 diabetes mellitus with hypoglycemia, with long-term current use of insulin ([place])    Discharge Instructions   Allergies as of 01/05/2022   No Known Allergies      Medication List     STOP taking these medications    carvedilol 12.5 MG tablet Commonly known as: COREG   glimepiride 4 MG tablet Commonly known as: AMARYL   losartan-hydrochlorothiazide 100-25 MG  tablet Commonly known as: HYZAAR   methylPREDNISolone 4 MG Tbpk tablet Commonly known as: MEDROL DOSEPAK   morphine 15 MG tablet Commonly known as: MSIR   Olmesartan-amLODIPine-HCTZ 40-10-25 MG Tabs   oxyCODONE-acetaminophen 5-325 MG tablet Commonly known as: PERCOCET/ROXICET       TAKE these medications    acetaminophen 500 MG tablet Commonly known as: TYLENOL Take 1,000 mg by mouth every 6 (six) hours as needed for mild pain or headache.   allopurinol 100 MG tablet Commonly known as: ZYLOPRIM Take 1 tablet (100 mg total) by mouth 2 (two) times daily.   amLODipine 5 MG tablet Commonly known as: NORVASC Take 5 mg by mouth daily.   BD Pen Needle Nano U/F 32G X 4 MM Misc Generic drug: Insulin Pen Needle Use as directed   Insulin Pen Needle 32G X 4 MM Misc Use as directed   colchicine 0.6 MG tablet Take 2 tablets (1.2 mg) during a gout flareup, then take 1 tablet an hour later. What changed:  how much to take how to take this when to take this additional instructions   furosemide 20 MG tablet Commonly known as: Lasix Take 1 tablet (20 mg total) by mouth daily as needed. What changed: reasons to take this   gabapentin 300 MG capsule Commonly known as: NEURONTIN Take 1 capsule (300 mg total) by mouth 3 (three) times daily. What changed:  when to take this reasons to take this   Jardiance 25 MG Tabs tablet Generic drug: empagliflozin Take 25 mg by mouth daily.   Lantus SoloStar 100 UNIT/ML Solostar Pen Generic drug: insulin glargine Inject 25 Units into the skin 2 (two) times daily. What changed: how  much to take   metoprolol tartrate 25 MG tablet Commonly known as: LOPRESSOR Take 0.5 tablets (12.5 mg total) by mouth 2 (two) times daily. What changed: how much to take   niMODipine 30 MG capsule Commonly known as: NIMOTOP Take 2 capsules (60 mg total) by mouth every 4 (four) hours for 20 days.   NovoLOG FlexPen 100 UNIT/ML FlexPen Generic drug:  insulin aspart Inject 4 Units into the skin 3 (three) times daily with meals. What changed: when to take this   predniSONE 20 MG tablet Commonly known as: DELTASONE Take 2 tablets (40 mg total) by mouth daily.   simvastatin 10 MG tablet Commonly known as: ZOCOR Take 10 mg by mouth at bedtime.   tiZANidine 4 MG tablet Commonly known as: ZANAFLEX Take 1 tablet (4 mg total) by mouth 2 (two) times daily as needed for muscle spasms.   traZODone 100 MG tablet Commonly known as: DESYREL Take 100 mg by mouth at bedtime as needed for sleep.   vitamin C 1000 MG tablet Take 1 tablet (1,000 mg total) by mouth daily.        No Known Allergies  Consultations: None   Procedures/Studies: US RENAL  Result Date: 01/03/2022 CLINICAL DATA:  Acute kidney injury EXAM: RENAL / URINARY TRACT ULTRASOUND COMPLETE COMPARISON:  CT renal stone dated 09/22/2020 FINDINGS: Right Kidney: Length = 10.7 cm AP renal pelvis diameter = <10 mm Mildly increased cortical echogenicity. No urinary tract dilation or shadowing calculi. The ureter is not seen. Left Kidney: Length = 9.6 cm AP renal pelvis diameter = <10 mm Normal parenchymal echogenicity with preserved corticomedullary differentiation. No urinary tract dilation or shadowing calculi. The ureter is not seen. Bladder: Appears normal for degree of bladder distention. Other: Technically challenging examination due to patient body habitus. IMPRESSION: 1. No urinary tract dilation or shadowing calculi. 2. Mildly increased right renal cortical echogenicity can be seen in the setting of medical renal disease. Electronically Signed   By: Darrin Nipper M.D.   On: 01/03/2022 11:41   CT Head Wo Contrast  Result Date: 01/03/2022 CLINICAL DATA:  Head trauma, moderate-severe.  Fall. EXAM: CT HEAD WITHOUT CONTRAST TECHNIQUE: Contiguous axial images were obtained from the base of the skull through the vertex without intravenous contrast. RADIATION DOSE REDUCTION: This exam  was performed according to the departmental dose-optimization program which includes automated exposure control, adjustment of the mA and/or kV according to patient size and/or use of iterative reconstruction technique. COMPARISON:  Head CT and MRI 11/08/2021 FINDINGS: Brain: A 5 mm focus of acute hemorrhage superficially in the anterolateral right frontal lobe is compatible with a small hemorrhagic contusion with minimal surrounding edema. There is a small amount of subarachnoid hemorrhage in the left sylvian fissure. No acute infarct, midline shift, or extra-axial fluid collection is identified. The ventricles are normal in size. Patchy hypodensities in the cerebral white matter bilaterally are unchanged and nonspecific but compatible with moderately age advanced chronic small vessel ischemic disease. Vascular: No definite hyperdense vessel within limitations of acute hemorrhage. Skull: Left orbital floor fracture with up to 5 mm of displacement. Left lamina papyracea fracture without significant depression. Sinuses/Orbits: Left orbital fractures as described above. Mild left orbital emphysema. Moderately large left periorbital hematoma. No retrobulbar hematoma. Small volume fluid/blood and mucosal thickening in the inferior left frontal, left ethmoid, and left maxillary sinuses. Clear mastoid air cells. Other: None. Critical Value/emergent results were called by telephone at the time of interpretation on 01/03/2022 at 8:42 am to  Dr. Marye Round, who verbally acknowledged these results. IMPRESSION: 1. Small hemorrhagic contusion in the right frontal lobe. 2. Small amount of subarachnoid hemorrhage in the left sylvian fissure. 3. Fractures of the floor and medial wall of the left orbit. 4. Chronic small vessel ischemic disease. Electronically Signed   By: Logan Bores M.D.   On: 01/03/2022 08:53   CT Cervical Spine Wo Contrast  Result Date: 01/03/2022 CLINICAL DATA:  Neck trauma, intoxicated or obtunded (Age >=  16y) EXAM: CT CERVICAL SPINE WITHOUT CONTRAST TECHNIQUE: Multidetector CT imaging of the cervical spine was performed without intravenous contrast. Multiplanar CT image reconstructions were also generated. RADIATION DOSE REDUCTION: This exam was performed according to the departmental dose-optimization program which includes automated exposure control, adjustment of the mA and/or kV according to patient size and/or use of iterative reconstruction technique. COMPARISON:  None Available. FINDINGS: Alignment: Physiologic. Skull base and vertebrae: There is no evidence of acute cervical spine fracture. Degenerative endplate changes worst at C5-C6. No aggressive osseous lesion. There is nonspecific osteosclerosis of the posterior elements and posterior aspects of the vertebral bodies, present throughout the spine as seen on multiple prior CTs dating back to September 1027, likely metabolic. Soft tissues and spinal canal: No prevertebral fluid or swelling. No visible canal hematoma. Disc levels: There is moderate degenerative disc disease C5-C6. Posterior disc osteophyte complex at C5-C6 and C6-C7 results in no significant stenosis. Upper chest: Negative. Other: Subcentimeter hypodense right thyroid nodule requires no follow-up imaging. IMPRESSION: No evidence of acute cervical spine fracture. Electronically Signed   By: Maurine Simmering M.D.   On: 01/03/2022 08:45   CT ELBOW LEFT WO CONTRAST  Result Date: 12/29/2021 CLINICAL DATA:  Chronic elbow pain.  Possible gout. EXAM: CT OF THE UPPER LEFT EXTREMITY WITHOUT CONTRAST TECHNIQUE: Multidetector CT imaging of the upper left extremity was performed according to the standard protocol. RADIATION DOSE REDUCTION: This exam was performed according to the departmental dose-optimization program which includes automated exposure control, adjustment of the mA and/or kV according to patient size and/or use of iterative reconstruction technique. COMPARISON:  Radiographs 12/29/2021  FINDINGS: Due to pain, the patient was unable to position his arm above his head, and the elbow had to be scanned adjacent to his chest and abdomen. This not only introduces motion, but part of the elbow was excluded from the region which could be imaged. This portion encompasses the lateral condyle, epicondyle, and part of the radial head. This occurred despite patient repositioning and could not be eliminated. As result, the elbow was not imaged in its entirety. Bones/Joint/Cartilage Spurring of the olecranon at the triceps attachment site and at the articular margin. Spurring of the coronoid process. There is lucency and irregularity along the posterolateral radial head on images 86 through 93 of series 9, potentially from radial head fracture or spurring. Lower thoracic spondylosis. Ligaments N/A Muscles and Tendons Grossly unremarkable Soft tissues Soft tissue swelling posterior to the olecranon, query olecranon bursitis. Differentiation of bursal fluid from infiltrative edema could be accomplished with focused sonography. I am skeptical of an elbow joint effusion although this is not certain. Other imaging findings of potential clinical significance: Coronary atherosclerosis. IMPRESSION: 1. Due to pain, the patient was unable to position his arm above his head, and the elbow had to be scanned adjacent to his chest and abdomen. Because of this and body habitus, part of the elbow could not be included in the addressable volume, and was excluded. Imaging planes are highly suboptimal as well,  with reduced signal to noise ratio due to inclusion of the chest and abdomen. 2. Lucency and irregularity along the posterolateral radial head, potentially from radial head fracture or spurring. 3. Soft tissue swelling posterior to the olecranon, query olecranon bursitis. Differentiation of bursal fluid from infiltrative edema could be accomplished with focused sonography overlying the olecranon. 4. Coronary atherosclerosis.  5. Lower thoracic spondylosis. Electronically Signed   By: Van Clines M.D.   On: 12/29/2021 10:54   DG Elbow 2 Views Left  Result Date: 12/29/2021 CLINICAL DATA:  Left wrist and elbow pain EXAM: LEFT ELBOW - 2 VIEW COMPARISON:  None Available. FINDINGS: Best possible images due to limited range of motion. Limited projections compromises sensitivity. Noting this there is no definite fracture. No dislocation. Soft tissue swelling about the elbow. IMPRESSION: No definite fracture.  Soft tissue swelling about the elbow. Electronically Signed   By: Placido Sou M.D.   On: 12/29/2021 01:30     Subjective: No acute issues/events overnight   Discharge Exam: Vitals:   01/04/22 2359 01/05/22 0355  BP: (!) 150/92 135/85  Pulse: 77 68  Resp: 16 17  Temp: 99.4 F (37.4 C) (!) 97.5 F (36.4 C)  SpO2: 99% 100%   Vitals:   01/04/22 2012 01/04/22 2013 01/04/22 2359 01/05/22 0355  BP: (!) 159/84 (!) 159/84 (!) 150/92 135/85  Pulse: 81 80 77 68  Resp: 17 17 16 17   Temp: 98.9 F (37.2 C) 98.9 F (37.2 C) 99.4 F (37.4 C) (!) 97.5 F (36.4 C)  TempSrc: Oral Oral Oral Oral  SpO2: 97% 99% 99% 100%  Weight:      Height:        General: Pt is alert, awake, not in acute distress Cardiovascular: RRR, S1/S2 +, no rubs, no gallops Respiratory: CTA bilaterally, no wheezing, no rhonchi Abdominal: Soft, NT, ND, bowel sounds + Extremities: no edema, no cyanosis, bilateral BKA  The results of significant diagnostics from this hospitalization (including imaging, microbiology, ancillary and laboratory) are listed below for reference.     Microbiology: Recent Results (from the past 240 hour(s))  Resp Panel by RT-PCR (Flu A&B, Covid) Anterior Nasal Swab     Status: None   Collection Time: 12/29/21  2:04 AM   Specimen: Anterior Nasal Swab  Result Value Ref Range Status   SARS Coronavirus 2 by RT PCR NEGATIVE NEGATIVE Final    Comment: (NOTE) SARS-CoV-2 target nucleic acids are NOT  DETECTED.  The SARS-CoV-2 RNA is generally detectable in upper respiratory specimens during the acute phase of infection. The lowest concentration of SARS-CoV-2 viral copies this assay can detect is 138 copies/mL. A negative result does not preclude SARS-Cov-2 infection and should not be used as the sole basis for treatment or other patient management decisions. A negative result may occur with  improper specimen collection/handling, submission of specimen other than nasopharyngeal swab, presence of viral mutation(s) within the areas targeted by this assay, and inadequate number of viral copies(<138 copies/mL). A negative result must be combined with clinical observations, patient history, and epidemiological information. The expected result is Negative.  Fact Sheet for Patients:  EntrepreneurPulse.com.au  Fact Sheet for Healthcare Providers:  IncredibleEmployment.be  This test is no t yet approved or cleared by the [place] FDA and  has been authorized for detection and/or diagnosis of SARS-CoV-2 by FDA under an Emergency Use Authorization (EUA). This EUA will remain  in effect (meaning this test can be used) for the duration of the COVID-19 declaration under  Section 564(b)(1) of the Act, 21 U.S.C.section 360bbb-3(b)(1), unless the authorization is terminated  or revoked sooner.       Influenza A by PCR NEGATIVE NEGATIVE Final   Influenza B by PCR NEGATIVE NEGATIVE Final    Comment: (NOTE) The Xpert Xpress SARS-CoV-2/FLU/RSV plus assay is intended as an aid in the diagnosis of influenza from Nasopharyngeal swab specimens and should not be used as a sole basis for treatment. Nasal washings and aspirates are unacceptable for Xpert Xpress SARS-CoV-2/FLU/RSV testing.  Fact Sheet for Patients: EntrepreneurPulse.com.au  Fact Sheet for Healthcare Providers: IncredibleEmployment.be  This test is not yet  approved or cleared by the [place] FDA and has been authorized for detection and/or diagnosis of SARS-CoV-2 by FDA under an Emergency Use Authorization (EUA). This EUA will remain in effect (meaning this test can be used) for the duration of the COVID-19 declaration under Section 564(b)(1) of the Act, 21 U.S.C. section 360bbb-3(b)(1), unless the authorization is terminated or revoked.  Performed at [facility], [address] [place], [place] [address]      Labs: BNP (last 3 results) No results for input(s): "BNP" in the last 8760 hours. Basic Metabolic Panel: Recent Labs  Lab 01/03/22 0522 01/04/22 0438  NA 140 140  K 4.9 5.5*  CL 109 114*  CO2 15* 17*  GLUCOSE 35* 159*  BUN 56* 53*  CREATININE 3.56* 3.26*  CALCIUM 7.4* 7.0*   Liver Function Tests: Recent Labs  Lab 01/03/22 0705  AST 37  ALT 20  ALKPHOS 86  BILITOT 0.3  PROT 6.4*  ALBUMIN 3.0*   No results for input(s): "LIPASE", "AMYLASE" in the last 168 hours. No results for input(s): "AMMONIA" in the last 168 hours. CBC: Recent Labs  Lab 01/03/22 0522 01/04/22 0438  WBC 10.2 8.0  NEUTROABS 6.9  --   HGB 9.4* 8.0*  HCT 30.4* 25.6*  MCV 88.1 87.4  PLT 411* 354   Cardiac Enzymes: Recent Labs  Lab 01/04/22 0438  CKTOTAL 908*   BNP: Invalid input(s): "POCBNP" CBG: Recent Labs  Lab 01/04/22 0603 01/04/22 1135 01/04/22 1642 01/04/22 2009 01/05/22 0630  GLUCAP 151* 174* 276* 393* 192*   D-Dimer No results for input(s): "DDIMER" in the last 72 hours. Hgb A1c Recent Labs    01/04/22 0438  HGBA1C 6.7*   Lipid Profile No results for input(s): "CHOL", "HDL", "LDLCALC", "TRIG", "CHOLHDL", "LDLDIRECT" in the last 72 hours. Thyroid function studies Recent Labs    01/03/22 0705  TSH 1.257   Anemia work up No results for input(s): "VITAMINB12", "FOLATE", "FERRITIN", "TIBC", "IRON", "RETICCTPCT" in the last 72 hours. Urinalysis    Component Value Date/Time    COLORURINE YELLOW 09/24/2020 2355   APPEARANCEUR CLEAR 09/24/2020 2355   LABSPEC 1.017 09/24/2020 2355   PHURINE 5.0 09/24/2020 2355   GLUCOSEU NEGATIVE 09/24/2020 2355   HGBUR MODERATE (A) 09/24/2020 2355   HGBUR negative 01/27/2010 0934   BILIRUBINUR NEGATIVE 09/24/2020 2355   KETONESUR NEGATIVE 09/24/2020 2355   PROTEINUR 100 (A) 09/24/2020 2355   UROBILINOGEN 0.2 10/26/2013 1130   NITRITE NEGATIVE 09/24/2020 2355   LEUKOCYTESUR NEGATIVE 09/24/2020 2355   Sepsis Labs Recent Labs  Lab 01/03/22 0522 01/04/22 0438  WBC 10.2 8.0   Microbiology Recent Results (from the past 240 hour(s))  Resp Panel by RT-PCR (Flu A&B, Covid) Anterior Nasal Swab     Status: None   Collection Time: 12/29/21  2:04 AM   Specimen: Anterior Nasal Swab  Result Value Ref Range Status  SARS Coronavirus 2 by RT PCR NEGATIVE NEGATIVE Final    Comment: (NOTE) SARS-CoV-2 target nucleic acids are NOT DETECTED.  The SARS-CoV-2 RNA is generally detectable in upper respiratory specimens during the acute phase of infection. The lowest concentration of SARS-CoV-2 viral copies this assay can detect is 138 copies/mL. A negative result does not preclude SARS-Cov-2 infection and should not be used as the sole basis for treatment or other patient management decisions. A negative result may occur with  improper specimen collection/handling, submission of specimen other than nasopharyngeal swab, presence of viral mutation(s) within the areas targeted by this assay, and inadequate number of viral copies(<138 copies/mL). A negative result must be combined with clinical observations, patient history, and epidemiological information. The expected result is Negative.  Fact Sheet for Patients:  EntrepreneurPulse.com.au  Fact Sheet for Healthcare Providers:  IncredibleEmployment.be  This test is no t yet approved or cleared by the [place] FDA and  has been authorized for  detection and/or diagnosis of SARS-CoV-2 by FDA under an Emergency Use Authorization (EUA). This EUA will remain  in effect (meaning this test can be used) for the duration of the COVID-19 declaration under Section 564(b)(1) of the Act, 21 U.S.C.section 360bbb-3(b)(1), unless the authorization is terminated  or revoked sooner.       Influenza A by PCR NEGATIVE NEGATIVE Final   Influenza B by PCR NEGATIVE NEGATIVE Final    Comment: (NOTE) The Xpert Xpress SARS-CoV-2/FLU/RSV plus assay is intended as an aid in the diagnosis of influenza from Nasopharyngeal swab specimens and should not be used as a sole basis for treatment. Nasal washings and aspirates are unacceptable for Xpert Xpress SARS-CoV-2/FLU/RSV testing.  Fact Sheet for Patients: EntrepreneurPulse.com.au  Fact Sheet for Healthcare Providers: IncredibleEmployment.be  This test is not yet approved or cleared by the [place] FDA and has been authorized for detection and/or diagnosis of SARS-CoV-2 by FDA under an Emergency Use Authorization (EUA). This EUA will remain in effect (meaning this test can be used) for the duration of the COVID-19 declaration under Section 564(b)(1) of the Act, 21 U.S.C. section 360bbb-3(b)(1), unless the authorization is terminated or revoked.  Performed at [facility], [address], [place], [place] [address]      Time coordinating discharge: Over 30 minutes  SIGNED:   Little Ishikawa, DO [facility] 01/05/2022, 7:45 AM Pager   If 7PM-7AM, please contact night-coverage [email or web address]

## 2022-01-08 ENCOUNTER — Inpatient Hospital Stay (HOSPITAL_BASED_OUTPATIENT_CLINIC_OR_DEPARTMENT_OTHER)
Admission: EM | Admit: 2022-01-08 | Discharge: 2022-01-11 | DRG: 603 | Disposition: A | Discharge status: Home / Self Care | Payer: BC Managed Care – PPO | Attending: Internal Medicine | Admitting: Internal Medicine

## 2022-01-08 ENCOUNTER — Emergency Department (HOSPITAL_BASED_OUTPATIENT_CLINIC_OR_DEPARTMENT_OTHER): Payer: BC Managed Care – PPO

## 2022-01-08 ENCOUNTER — Other Ambulatory Visit: Payer: Self-pay

## 2022-01-08 ENCOUNTER — Encounter (HOSPITAL_BASED_OUTPATIENT_CLINIC_OR_DEPARTMENT_OTHER): Payer: Self-pay

## 2022-01-08 DIAGNOSIS — L03113 Cellulitis of right upper limb: Principal | ICD-10-CM | POA: Diagnosis present

## 2022-01-08 DIAGNOSIS — E876 Hypokalemia: Secondary | ICD-10-CM | POA: Diagnosis not present

## 2022-01-08 DIAGNOSIS — S0230XD Fracture of orbital floor, unspecified side, subsequent encounter for fracture with routine healing: Secondary | ICD-10-CM

## 2022-01-08 DIAGNOSIS — S199XXA Unspecified injury of neck, initial encounter: Secondary | ICD-10-CM | POA: Diagnosis not present

## 2022-01-08 DIAGNOSIS — Z79899 Other long term (current) drug therapy: Secondary | ICD-10-CM | POA: Diagnosis not present

## 2022-01-08 DIAGNOSIS — A419 Sepsis, unspecified organism: Secondary | ICD-10-CM

## 2022-01-08 DIAGNOSIS — E860 Dehydration: Secondary | ICD-10-CM | POA: Diagnosis present

## 2022-01-08 DIAGNOSIS — Z01818 Encounter for other preprocedural examination: Secondary | ICD-10-CM | POA: Diagnosis not present

## 2022-01-08 DIAGNOSIS — R071 Chest pain on breathing: Secondary | ICD-10-CM | POA: Diagnosis not present

## 2022-01-08 DIAGNOSIS — Z833 Family history of diabetes mellitus: Secondary | ICD-10-CM | POA: Diagnosis not present

## 2022-01-08 DIAGNOSIS — E1122 Type 2 diabetes mellitus with diabetic chronic kidney disease: Secondary | ICD-10-CM | POA: Diagnosis present

## 2022-01-08 DIAGNOSIS — Z89512 Acquired absence of left leg below knee: Secondary | ICD-10-CM

## 2022-01-08 DIAGNOSIS — N1831 Chronic kidney disease, stage 3a: Secondary | ICD-10-CM | POA: Diagnosis present

## 2022-01-08 DIAGNOSIS — M19021 Primary osteoarthritis, right elbow: Secondary | ICD-10-CM | POA: Diagnosis not present

## 2022-01-08 DIAGNOSIS — E785 Hyperlipidemia, unspecified: Secondary | ICD-10-CM | POA: Diagnosis present

## 2022-01-08 DIAGNOSIS — Z6841 Body Mass Index (BMI) 40.0 and over, adult: Secondary | ICD-10-CM

## 2022-01-08 DIAGNOSIS — Z89511 Acquired absence of right leg below knee: Secondary | ICD-10-CM | POA: Diagnosis not present

## 2022-01-08 DIAGNOSIS — R079 Chest pain, unspecified: Secondary | ICD-10-CM | POA: Diagnosis not present

## 2022-01-08 DIAGNOSIS — I129 Hypertensive chronic kidney disease with stage 1 through stage 4 chronic kidney disease, or unspecified chronic kidney disease: Secondary | ICD-10-CM | POA: Diagnosis present

## 2022-01-08 DIAGNOSIS — Z7984 Long term (current) use of oral hypoglycemic drugs: Secondary | ICD-10-CM | POA: Diagnosis not present

## 2022-01-08 DIAGNOSIS — Z8616 Personal history of COVID-19: Secondary | ICD-10-CM | POA: Diagnosis not present

## 2022-01-08 DIAGNOSIS — Z794 Long term (current) use of insulin: Secondary | ICD-10-CM | POA: Diagnosis not present

## 2022-01-08 DIAGNOSIS — Z7985 Long-term (current) use of injectable non-insulin antidiabetic drugs: Secondary | ICD-10-CM

## 2022-01-08 DIAGNOSIS — E1151 Type 2 diabetes mellitus with diabetic peripheral angiopathy without gangrene: Secondary | ICD-10-CM | POA: Diagnosis present

## 2022-01-08 DIAGNOSIS — M7989 Other specified soft tissue disorders: Secondary | ICD-10-CM | POA: Diagnosis not present

## 2022-01-08 DIAGNOSIS — M940 Chondrocostal junction syndrome [Tietze]: Secondary | ICD-10-CM | POA: Diagnosis present

## 2022-01-08 DIAGNOSIS — Z1152 Encounter for screening for COVID-19: Secondary | ICD-10-CM

## 2022-01-08 DIAGNOSIS — W19XXXD Unspecified fall, subsequent encounter: Secondary | ICD-10-CM | POA: Diagnosis present

## 2022-01-08 DIAGNOSIS — M79631 Pain in right forearm: Secondary | ICD-10-CM | POA: Diagnosis not present

## 2022-01-08 DIAGNOSIS — S066XAD Traumatic subarachnoid hemorrhage with loss of consciousness status unknown, subsequent encounter: Secondary | ICD-10-CM | POA: Diagnosis not present

## 2022-01-08 DIAGNOSIS — E871 Hypo-osmolality and hyponatremia: Secondary | ICD-10-CM | POA: Diagnosis not present

## 2022-01-08 DIAGNOSIS — S02832A Fracture of medial orbital wall, left side, initial encounter for closed fracture: Secondary | ICD-10-CM | POA: Diagnosis not present

## 2022-01-08 DIAGNOSIS — S01112D Laceration without foreign body of left eyelid and periocular area, subsequent encounter: Secondary | ICD-10-CM

## 2022-01-08 DIAGNOSIS — R0781 Pleurodynia: Secondary | ICD-10-CM | POA: Diagnosis not present

## 2022-01-08 DIAGNOSIS — M109 Gout, unspecified: Secondary | ICD-10-CM | POA: Diagnosis present

## 2022-01-08 DIAGNOSIS — M1811 Unilateral primary osteoarthritis of first carpometacarpal joint, right hand: Secondary | ICD-10-CM | POA: Diagnosis not present

## 2022-01-08 LAB — PROTIME-INR
INR: 1.2 (ref 0.8–1.2)
Prothrombin Time: 15 seconds (ref 11.4–15.2)

## 2022-01-08 LAB — CBC WITH DIFFERENTIAL/PLATELET
Abs Immature Granulocytes: 0.27 10*3/uL — ABNORMAL HIGH (ref 0.00–0.07)
Basophils Absolute: 0 10*3/uL (ref 0.0–0.1)
Basophils Relative: 0 %
Eosinophils Absolute: 0 10*3/uL (ref 0.0–0.5)
Eosinophils Relative: 0 %
HCT: 27.4 % — ABNORMAL LOW (ref 39.0–52.0)
Hemoglobin: 8.7 g/dL — ABNORMAL LOW (ref 13.0–17.0)
Immature Granulocytes: 2 %
Lymphocytes Relative: 13 %
Lymphs Abs: 2 10*3/uL (ref 0.7–4.0)
MCH: 27.3 pg (ref 26.0–34.0)
MCHC: 31.8 g/dL (ref 30.0–36.0)
MCV: 85.9 fL (ref 80.0–100.0)
Monocytes Absolute: 1.1 10*3/uL — ABNORMAL HIGH (ref 0.1–1.0)
Monocytes Relative: 7 %
Neutro Abs: 12.3 10*3/uL — ABNORMAL HIGH (ref 1.7–7.7)
Neutrophils Relative %: 78 %
Platelets: 344 10*3/uL (ref 150–400)
RBC: 3.19 MIL/uL — ABNORMAL LOW (ref 4.22–5.81)
RDW: 14.6 % (ref 11.5–15.5)
WBC: 15.7 10*3/uL — ABNORMAL HIGH (ref 4.0–10.5)
nRBC: 0 % (ref 0.0–0.2)

## 2022-01-08 LAB — COMPREHENSIVE METABOLIC PANEL
ALT: 22 U/L (ref 0–44)
AST: 26 U/L (ref 15–41)
Albumin: 3.4 g/dL — ABNORMAL LOW (ref 3.5–5.0)
Alkaline Phosphatase: 85 U/L (ref 38–126)
Anion gap: 12 (ref 5–15)
BUN: 37 mg/dL — ABNORMAL HIGH (ref 6–20)
CO2: 16 mmol/L — ABNORMAL LOW (ref 22–32)
Calcium: 7.3 mg/dL — ABNORMAL LOW (ref 8.9–10.3)
Chloride: 106 mmol/L (ref 98–111)
Creatinine, Ser: 2.68 mg/dL — ABNORMAL HIGH (ref 0.61–1.24)
GFR, Estimated: 27 mL/min — ABNORMAL LOW (ref >60)
Glucose, Bld: 165 mg/dL — ABNORMAL HIGH (ref 70–99)
Potassium: 4.4 mmol/L (ref 3.5–5.1)
Sodium: 134 mmol/L — ABNORMAL LOW (ref 135–145)
Total Bilirubin: 0.9 mg/dL (ref 0.3–1.2)
Total Protein: 7.1 g/dL (ref 6.5–8.1)

## 2022-01-08 LAB — LACTIC ACID, PLASMA: Lactic Acid, Venous: 0.9 mmol/L (ref 0.5–1.9)

## 2022-01-08 LAB — TROPONIN I (HIGH SENSITIVITY): Troponin I (High Sensitivity): 52 ng/L — ABNORMAL HIGH (ref <18)

## 2022-01-08 LAB — APTT: aPTT: 30 seconds (ref 24–36)

## 2022-01-08 MED ORDER — ACETAMINOPHEN 500 MG PO TABS
1000.0000 mg | ORAL_TABLET | Freq: Once | ORAL | Status: AC
  Administered 2022-01-08: 1000 mg via ORAL
  Filled 2022-01-08: qty 2

## 2022-01-08 MED ORDER — VANCOMYCIN HCL IN DEXTROSE 1-5 GM/200ML-% IV SOLN
1000.0000 mg | Freq: Once | INTRAVENOUS | Status: AC
  Administered 2022-01-08: 1000 mg via INTRAVENOUS
  Filled 2022-01-08: qty 200

## 2022-01-08 MED ORDER — LACTATED RINGERS IV BOLUS
1000.0000 mL | Freq: Once | INTRAVENOUS | Status: AC
  Administered 2022-01-08: 1000 mL via INTRAVENOUS

## 2022-01-08 MED ORDER — LACTATED RINGERS IV SOLN: INTRAVENOUS | Status: DC

## 2022-01-08 MED ORDER — SODIUM CHLORIDE 0.9 % IV SOLN
2.0000 g | Freq: Once | INTRAVENOUS | Status: AC
  Administered 2022-01-08: 2 g via INTRAVENOUS
  Filled 2022-01-08: qty 12.5

## 2022-01-08 MED ORDER — FENTANYL CITRATE PF 50 MCG/ML IJ SOSY
50.0000 ug | PREFILLED_SYRINGE | Freq: Once | INTRAMUSCULAR | Status: AC
  Administered 2022-01-08: 50 ug via INTRAVENOUS
  Filled 2022-01-08: qty 1

## 2022-01-08 NOTE — Sepsis Progress Note (Signed)
Following per sepsis protocol   

## 2022-01-08 NOTE — ED Triage Notes (Signed)
Fell yesterday down steps.  C/O bilateral rib pain Rt. Hand swollen and rt. Elbow pain states he thinks it may be a "gout flare"

## 2022-01-08 NOTE — ED Provider Notes (Incomplete)
Patrick [name] Provider Note   CSN: [id number] Arrival date & time: 01/08/22  1935     History {Add pertinent medical, surgical, social history, OB history to HPI:1} Chief Complaint  Patient presents with   Fall   Chest Pain    Patrick [name] is a 56 y.o. male.   Fall Associated symptoms include chest pain.  Chest Pain      Home Medications Prior to Admission medications   Medication Sig Start Date End Date Taking? Authorizing Provider  acetaminophen (TYLENOL) 500 MG tablet Take 1,000 mg by mouth every 6 (six) hours as needed for mild pain or headache.    [provider]  allopurinol (ZYLOPRIM) 100 MG tablet Take 1 tablet (100 mg total) by mouth 2 (two) times daily. 12/16/21   Veryl Speak, MD  amLODipine (NORVASC) 5 MG tablet Take 5 mg by mouth daily. 08/18/21   [provider]  ascorbic acid (VITAMIN C) 1000 MG tablet Take 1 tablet (1,000 mg total) by mouth daily. 09/08/20   Angiulli, Lavon Paganini, PA-C  blood glucose meter kit and supplies KIT Dispense based on patient and insurance preference. Use up to four times daily as directed. 01/05/22   Little Ishikawa, MD  colchicine 0.6 MG tablet Take 2 tablets (1.2 mg) during a gout flareup, then take 1 tablet an hour later. Patient taking differently: Take 0.6-1.2 mg by mouth See admin instructions. Take 2 tablets (1.2 mg) during a gout flareup, then take 1 tablet (0.6 mg) an hour later. 12/16/21   Veryl Speak, MD  furosemide (LASIX) 20 MG tablet Take 1 tablet (20 mg total) by mouth daily as needed. Patient taking differently: Take 20 mg by mouth daily as needed for edema. 11/03/20 01/04/22  [name], MD  gabapentin (NEURONTIN) 300 MG capsule Take 1 capsule (300 mg total) by mouth 3 (three) times daily. Patient taking differently: Take 300 mg by mouth 3 (three) times daily as needed (pain). 09/08/20   Angiulli, Lavon Paganini, PA-C  insulin aspart (NOVOLOG FLEXPEN) 100 UNIT/ML FlexPen  Inject 4 Units into the skin 3 (three) times daily with meals. Patient taking differently: Inject 4 Units into the skin 2 (two) times daily. 09/08/20   Angiulli, Lavon Paganini, PA-C  insulin glargine (LANTUS SOLOSTAR) 100 UNIT/ML Solostar Pen Inject 25 Units into the skin 2 (two) times daily. Patient taking differently: Inject 15 Units into the skin 2 (two) times daily. 09/08/20   Angiulli, Lavon Paganini, PA-C  Insulin Pen Needle 32G X 4 MM MISC Use as directed 09/08/20   [name], Clide Deutscher, MD  Insulin Pen Needle 32G X 4 MM MISC Use as directed 09/09/20   Angiulli, Lavon Paganini, PA-C  JARDIANCE 25 MG TABS tablet Take 25 mg by mouth daily. 09/28/21   [provider]  metoprolol tartrate (LOPRESSOR) 25 MG tablet Take 0.5 tablets (12.5 mg total) by mouth 2 (two) times daily. Patient taking differently: Take 25 mg by mouth 2 (two) times daily. 09/08/20   Angiulli, Lavon Paganini, PA-C  niMODipine (NIMOTOP) 30 MG capsule Take 2 capsules (60 mg total) by mouth every 4 (four) hours for 20 days. 01/05/22 01/25/22  Little Ishikawa, MD  predniSONE (DELTASONE) 20 MG tablet Take 2 tablets (40 mg total) by mouth daily. 12/29/21   Blanchie Dessert, MD  simvastatin (ZOCOR) 10 MG tablet Take 10 mg by mouth at bedtime. 12/08/21   [provider]  tiZANidine (ZANAFLEX) 4 MG tablet Take 1 tablet (4 mg total)  by mouth 2 (two) times daily as needed for muscle spasms. 09/29/20   Lavina Hamman, MD  traZODone (DESYREL) 100 MG tablet Take 100 mg by mouth at bedtime as needed for sleep. 10/07/21   [provider]      Allergies    Patient has no known allergies.    Review of Systems   Review of Systems  Cardiovascular:  Positive for chest pain.    Physical Exam Updated Vital Signs BP (!) 157/106 (BP Location: Right Arm)   Pulse (!) 104   Temp (!) 100.9 F (38.3 C) (Oral)   Resp 18   Ht _0  (1.88 m)   Wt 136 kg   SpO2 98%   BMI 38.50 kg/m  Physical Exam  ED Results / Procedures / Treatments    Labs (all labs ordered are listed, but only abnormal results are displayed) Labs Reviewed - No data to display  EKG None  Radiology No results found.  Procedures Procedures  {Document cardiac monitor, telemetry assessment procedure when appropriate:1}  Medications Ordered in ED Medications - No data to display  ED Course/ Medical Decision Making/ A&P                           Medical Decision Making  ***  {Document critical care time when appropriate:1} {Document review of labs and clinical decision tools ie heart score, Chads2Vasc2 etc:1}  {Document your independent review of radiology images, and any outside records:1} {Document your discussion with family members, caretakers, and with consultants:1} {Document social determinants of health affecting pt's care:1} {Document your decision making why or why not admission, treatments were needed:1} Final Clinical Impression(s) / ED Diagnoses Final diagnoses:  None    Rx / DC Orders ED Discharge Orders     None

## 2022-01-08 NOTE — ED Notes (Signed)
Fell yesterday on the stairwell. No LOC, no neck or back pain and no numbness/tingling. Pain to the left and right ribs in axilla on both sides. Coughing makes pain worse and is more SOB.

## 2022-01-09 ENCOUNTER — Emergency Department (HOSPITAL_BASED_OUTPATIENT_CLINIC_OR_DEPARTMENT_OTHER): Payer: BC Managed Care – PPO

## 2022-01-09 ENCOUNTER — Encounter (HOSPITAL_COMMUNITY): Payer: Self-pay

## 2022-01-09 DIAGNOSIS — W19XXXD Unspecified fall, subsequent encounter: Secondary | ICD-10-CM | POA: Diagnosis present

## 2022-01-09 DIAGNOSIS — E860 Dehydration: Secondary | ICD-10-CM | POA: Diagnosis present

## 2022-01-09 DIAGNOSIS — Z79899 Other long term (current) drug therapy: Secondary | ICD-10-CM | POA: Diagnosis not present

## 2022-01-09 DIAGNOSIS — N1831 Chronic kidney disease, stage 3a: Secondary | ICD-10-CM | POA: Diagnosis present

## 2022-01-09 DIAGNOSIS — S0230XD Fracture of orbital floor, unspecified side, subsequent encounter for fracture with routine healing: Secondary | ICD-10-CM | POA: Diagnosis not present

## 2022-01-09 DIAGNOSIS — L03113 Cellulitis of right upper limb: Secondary | ICD-10-CM | POA: Diagnosis present

## 2022-01-09 DIAGNOSIS — S02832A Fracture of medial orbital wall, left side, initial encounter for closed fracture: Secondary | ICD-10-CM | POA: Diagnosis not present

## 2022-01-09 DIAGNOSIS — Z6841 Body Mass Index (BMI) 40.0 and over, adult: Secondary | ICD-10-CM | POA: Diagnosis not present

## 2022-01-09 DIAGNOSIS — M940 Chondrocostal junction syndrome [Tietze]: Secondary | ICD-10-CM | POA: Diagnosis present

## 2022-01-09 DIAGNOSIS — Z89511 Acquired absence of right leg below knee: Secondary | ICD-10-CM | POA: Diagnosis not present

## 2022-01-09 DIAGNOSIS — Z7984 Long term (current) use of oral hypoglycemic drugs: Secondary | ICD-10-CM | POA: Diagnosis not present

## 2022-01-09 DIAGNOSIS — M109 Gout, unspecified: Secondary | ICD-10-CM | POA: Diagnosis present

## 2022-01-09 DIAGNOSIS — Z89512 Acquired absence of left leg below knee: Secondary | ICD-10-CM | POA: Diagnosis not present

## 2022-01-09 DIAGNOSIS — Z794 Long term (current) use of insulin: Secondary | ICD-10-CM | POA: Diagnosis not present

## 2022-01-09 DIAGNOSIS — Z833 Family history of diabetes mellitus: Secondary | ICD-10-CM | POA: Diagnosis not present

## 2022-01-09 DIAGNOSIS — Z7985 Long-term (current) use of injectable non-insulin antidiabetic drugs: Secondary | ICD-10-CM | POA: Diagnosis not present

## 2022-01-09 DIAGNOSIS — S066XAD Traumatic subarachnoid hemorrhage with loss of consciousness status unknown, subsequent encounter: Secondary | ICD-10-CM | POA: Diagnosis not present

## 2022-01-09 DIAGNOSIS — S199XXA Unspecified injury of neck, initial encounter: Secondary | ICD-10-CM | POA: Diagnosis not present

## 2022-01-09 DIAGNOSIS — Z1152 Encounter for screening for COVID-19: Secondary | ICD-10-CM | POA: Diagnosis not present

## 2022-01-09 DIAGNOSIS — E1122 Type 2 diabetes mellitus with diabetic chronic kidney disease: Secondary | ICD-10-CM | POA: Diagnosis present

## 2022-01-09 DIAGNOSIS — I129 Hypertensive chronic kidney disease with stage 1 through stage 4 chronic kidney disease, or unspecified chronic kidney disease: Secondary | ICD-10-CM | POA: Diagnosis present

## 2022-01-09 DIAGNOSIS — E1151 Type 2 diabetes mellitus with diabetic peripheral angiopathy without gangrene: Secondary | ICD-10-CM | POA: Diagnosis present

## 2022-01-09 DIAGNOSIS — E871 Hypo-osmolality and hyponatremia: Secondary | ICD-10-CM | POA: Diagnosis present

## 2022-01-09 DIAGNOSIS — Z8616 Personal history of COVID-19: Secondary | ICD-10-CM | POA: Diagnosis not present

## 2022-01-09 DIAGNOSIS — E785 Hyperlipidemia, unspecified: Secondary | ICD-10-CM | POA: Diagnosis present

## 2022-01-09 DIAGNOSIS — E876 Hypokalemia: Secondary | ICD-10-CM | POA: Diagnosis present

## 2022-01-09 HISTORY — DX: Cellulitis of right upper limb: L03.113

## 2022-01-09 LAB — C-REACTIVE PROTEIN: CRP: 14.5 mg/dL — ABNORMAL HIGH (ref <1.0)

## 2022-01-09 LAB — BLOOD GAS, VENOUS
Acid-base deficit: 7.7 mmol/L — ABNORMAL HIGH (ref 0.0–2.0)
Bicarbonate: 16.4 mmol/L — ABNORMAL LOW (ref 20.0–28.0)
O2 Saturation: 94.2 %
Patient temperature: 36.9
pCO2, Ven: 29 mmHg — ABNORMAL LOW (ref 44–60)
pH, Ven: 7.36 (ref 7.25–7.43)
pO2, Ven: 67 mmHg — ABNORMAL HIGH (ref 32–45)

## 2022-01-09 LAB — SEDIMENTATION RATE: Sed Rate: 85 mm/hr — ABNORMAL HIGH (ref 0–16)

## 2022-01-09 LAB — CBG MONITORING, ED
Glucose-Capillary: 129 mg/dL — ABNORMAL HIGH (ref 70–99)
Glucose-Capillary: 122 mg/dL — ABNORMAL HIGH (ref 70–99)
Glucose-Capillary: 114 mg/dL — ABNORMAL HIGH (ref 70–99)

## 2022-01-09 LAB — URINALYSIS, ROUTINE W REFLEX MICROSCOPIC
Bilirubin Urine: NEGATIVE
Glucose, UA: NEGATIVE mg/dL
Leukocytes,Ua: NEGATIVE
Nitrite: NEGATIVE
Protein, ur: 100 mg/dL — AB
Specific Gravity, Urine: 1.017 (ref 1.005–1.030)
pH: 6 (ref 5.0–8.0)

## 2022-01-09 LAB — HEPARIN LEVEL (UNFRACTIONATED): Heparin Unfractionated: <0.1 IU/mL — ABNORMAL LOW (ref 0.30–0.70)

## 2022-01-09 LAB — GLUCOSE, CAPILLARY
Glucose-Capillary: 106 mg/dL — ABNORMAL HIGH (ref 70–99)
Glucose-Capillary: 237 mg/dL — ABNORMAL HIGH (ref 70–99)

## 2022-01-09 LAB — RESP PANEL BY RT-PCR (FLU A&B, COVID) ARPGX2
Influenza A by PCR: NEGATIVE
Influenza B by PCR: NEGATIVE
SARS Coronavirus 2 by RT PCR: NEGATIVE

## 2022-01-09 LAB — CK: Total CK: 461 U/L — ABNORMAL HIGH (ref 49–397)

## 2022-01-09 LAB — LACTIC ACID, PLASMA: Lactic Acid, Venous: 0.7 mmol/L (ref 0.5–1.9)

## 2022-01-09 LAB — D-DIMER, QUANTITATIVE: D-Dimer, Quant: 4.79 ug/mL-FEU — ABNORMAL HIGH (ref 0.00–0.50)

## 2022-01-09 LAB — TROPONIN I (HIGH SENSITIVITY): Troponin I (High Sensitivity): 44 ng/L — ABNORMAL HIGH (ref <18)

## 2022-01-09 MED ORDER — SODIUM CHLORIDE 0.9 % IV SOLN: INTRAVENOUS | Status: DC | PRN

## 2022-01-09 MED ORDER — HEPARIN SODIUM (PORCINE) 5000 UNIT/ML IJ SOLN
5000.0000 [IU] | Freq: Two times a day (BID) | INTRAMUSCULAR | Status: DC
  Administered 2022-01-09 – 2022-01-10 (×2): 5000 [IU] via SUBCUTANEOUS
  Filled 2022-01-09 (×2): qty 1

## 2022-01-09 MED ORDER — HYDRALAZINE HCL 20 MG/ML IJ SOLN
20.0000 mg | Freq: Once | INTRAMUSCULAR | Status: AC
  Administered 2022-01-09: 20 mg via INTRAVENOUS
  Filled 2022-01-09: qty 1

## 2022-01-09 MED ORDER — GABAPENTIN 300 MG PO CAPS
300.0000 mg | ORAL_CAPSULE | Freq: Three times a day (TID) | ORAL | Status: DC | PRN
  Administered 2022-01-10: 300 mg via ORAL
  Filled 2022-01-09: qty 1

## 2022-01-09 MED ORDER — INSULIN ASPART 100 UNIT/ML FLEXPEN: 4.0000 [IU] | PEN_INJECTOR | Freq: Two times a day (BID) | SUBCUTANEOUS | Status: DC

## 2022-01-09 MED ORDER — AMLODIPINE BESYLATE 5 MG PO TABS
5.0000 mg | ORAL_TABLET | Freq: Every day | ORAL | Status: DC
  Administered 2022-01-09 – 2022-01-11 (×3): 5 mg via ORAL
  Filled 2022-01-09 (×3): qty 1

## 2022-01-09 MED ORDER — VANCOMYCIN HCL 1250 MG/250ML IV SOLN
1250.0000 mg | INTRAVENOUS | Status: DC
  Filled 2022-01-09: qty 250

## 2022-01-09 MED ORDER — ALLOPURINOL 300 MG PO TABS
150.0000 mg | ORAL_TABLET | Freq: Two times a day (BID) | ORAL | Status: DC
  Administered 2022-01-09 – 2022-01-11 (×4): 150 mg via ORAL
  Filled 2022-01-09 (×4): qty 1

## 2022-01-09 MED ORDER — SODIUM CHLORIDE 0.9 % IV SOLN
2.0000 g | Freq: Two times a day (BID) | INTRAVENOUS | Status: DC
  Administered 2022-01-09: 2 g via INTRAVENOUS
  Filled 2022-01-09: qty 12.5

## 2022-01-09 MED ORDER — INSULIN ASPART 100 UNIT/ML IJ SOLN
0.0000 [IU] | INTRAMUSCULAR | Status: DC
  Administered 2022-01-09: 3 [IU] via SUBCUTANEOUS
  Administered 2022-01-09: 1 [IU] via SUBCUTANEOUS
  Administered 2022-01-10 (×2): 2 [IU] via SUBCUTANEOUS
  Administered 2022-01-10: 7 [IU] via SUBCUTANEOUS
  Administered 2022-01-10 (×2): 2 [IU] via SUBCUTANEOUS
  Administered 2022-01-10: 9 [IU] via SUBCUTANEOUS
  Administered 2022-01-10 – 2022-01-11 (×3): 2 [IU] via SUBCUTANEOUS

## 2022-01-09 MED ORDER — HEPARIN BOLUS VIA INFUSION
3000.0000 [IU] | Freq: Once | INTRAVENOUS | Status: AC
  Administered 2022-01-09: 3000 [IU] via INTRAVENOUS

## 2022-01-09 MED ORDER — HYDRALAZINE HCL 25 MG PO TABS
25.0000 mg | ORAL_TABLET | Freq: Once | ORAL | Status: AC
  Administered 2022-01-09: 25 mg via ORAL
  Filled 2022-01-09: qty 1

## 2022-01-09 MED ORDER — HEPARIN (PORCINE) 25000 UT/250ML-% IV SOLN
2000.0000 [IU]/h | INTRAVENOUS | Status: DC
  Administered 2022-01-09: 1600 [IU]/h via INTRAVENOUS
  Filled 2022-01-09: qty 250

## 2022-01-09 MED ORDER — VITAMIN C 500 MG PO TABS
1000.0000 mg | ORAL_TABLET | Freq: Every day | ORAL | Status: DC
  Administered 2022-01-09 – 2022-01-11 (×3): 1000 mg via ORAL
  Filled 2022-01-09 (×3): qty 2

## 2022-01-09 MED ORDER — METOPROLOL TARTRATE 25 MG PO TABS
25.0000 mg | ORAL_TABLET | Freq: Two times a day (BID) | ORAL | Status: DC
  Administered 2022-01-09 – 2022-01-11 (×5): 25 mg via ORAL
  Filled 2022-01-09 (×5): qty 1

## 2022-01-09 MED ORDER — EMPAGLIFLOZIN 25 MG PO TABS
25.0000 mg | ORAL_TABLET | Freq: Every day | ORAL | Status: DC
  Administered 2022-01-10 – 2022-01-11 (×2): 25 mg via ORAL
  Filled 2022-01-09 (×2): qty 1

## 2022-01-09 MED ORDER — HEPARIN BOLUS VIA INFUSION
6000.0000 [IU] | Freq: Once | INTRAVENOUS | Status: AC
  Administered 2022-01-09: 6000 [IU] via INTRAVENOUS

## 2022-01-09 MED ORDER — ACETAMINOPHEN 500 MG PO TABS
1000.0000 mg | ORAL_TABLET | Freq: Four times a day (QID) | ORAL | Status: DC | PRN
  Administered 2022-01-09 (×2): 1000 mg via ORAL
  Filled 2022-01-09 (×2): qty 2

## 2022-01-09 MED ORDER — FENTANYL CITRATE PF 50 MCG/ML IJ SOSY
50.0000 ug | PREFILLED_SYRINGE | Freq: Once | INTRAMUSCULAR | Status: AC
  Administered 2022-01-09: 50 ug via INTRAVENOUS
  Filled 2022-01-09: qty 1

## 2022-01-09 MED ORDER — NIMODIPINE 30 MG PO CAPS
60.0000 mg | ORAL_CAPSULE | ORAL | Status: DC
  Administered 2022-01-09 – 2022-01-11 (×11): 60 mg via ORAL
  Filled 2022-01-09 (×16): qty 2

## 2022-01-09 MED ORDER — HYDROMORPHONE HCL 1 MG/ML IJ SOLN
0.5000 mg | INTRAMUSCULAR | Status: DC | PRN
  Administered 2022-01-09 – 2022-01-11 (×7): 0.5 mg via INTRAVENOUS
  Filled 2022-01-09 (×7): qty 1

## 2022-01-09 MED ORDER — ALLOPURINOL 100 MG PO TABS
100.0000 mg | ORAL_TABLET | Freq: Two times a day (BID) | ORAL | Status: DC
  Administered 2022-01-09: 100 mg via ORAL
  Filled 2022-01-09: qty 1

## 2022-01-09 MED ORDER — TRAZODONE HCL 50 MG PO TABS
100.0000 mg | ORAL_TABLET | Freq: Every evening | ORAL | Status: DC | PRN
  Administered 2022-01-09: 100 mg via ORAL
  Filled 2022-01-09: qty 2

## 2022-01-09 MED ORDER — PREDNISONE 50 MG PO TABS
50.0000 mg | ORAL_TABLET | Freq: Every day | ORAL | Status: DC
  Administered 2022-01-09 – 2022-01-11 (×3): 50 mg via ORAL
  Filled 2022-01-09 (×3): qty 1

## 2022-01-09 MED ORDER — SIMVASTATIN 20 MG PO TABS
10.0000 mg | ORAL_TABLET | Freq: Every day | ORAL | Status: DC
  Administered 2022-01-09 – 2022-01-10 (×2): 10 mg via ORAL
  Filled 2022-01-09 (×2): qty 1

## 2022-01-09 NOTE — ED Notes (Signed)
Handoff report given to Juanda Chance RN on 33M at [facility] at bedside.

## 2022-01-09 NOTE — Plan of Care (Signed)

## 2022-01-09 NOTE — ED Notes (Addendum)
CBG results reported to Colin Ina, RN

## 2022-01-09 NOTE — ED Notes (Signed)
[facility] was called, patient ready for transport  

## 2022-01-09 NOTE — Progress Notes (Signed)
Plan of Care Note for accepted transfer   Patient: QUINTERRIUS ERRINGTON MRN: [id number]   [place]: 01/08/2022  Facility requesting transfer: [facility]   Requesting Provider: Dr. Armandina Gemma   Reason for transfer: Cellulitis, rule-out PE   Facility course: 56 yr old man with HTN, IDDM, CKD 3B, PAD with b/l BKA, and recent admission for AMS who presents with pain in bilateral ribs, right hand, and right elbow. He reports falling yesterday as potential etiology for his complaints.   He is afebrile with slightly elevated HR and elevated BP. WBC is 15,700 with normal lactate, and ESR 85. SCr is 2.68 (improved from recent admission), troponin 52, and d-dimer 4.79. Venous US of RUE negative for DVT and plain films of right arm negative for acute fracture.   ED physician reports redness and swelling of RUE concerning for cellulitis but is also concerned for PE given pleuritic pain and elevated DVT. Blood cultures were collected and he was started on vancomycin, cefepime, and IV heparin.   Plan of care: The patient is accepted for admission to Telemetry unit, at [facility].   Author: Vianne Bulls, MD 01/09/2022  Check [email or web address] for on-call coverage.  Nursing staff, Please call [place] number on Amion as soon as patient's arrival, so appropriate admitting provider can evaluate the pt.

## 2022-01-09 NOTE — H&P (Signed)
History and Physical    Patrick [name] YTK:[id number] DOB: 02-Mar-1965 DOA: 01/08/2022  PCP: [facility], [facility] (Confirm with patient/family/NH records and if not entered, this has to be entered at [facility] point of entry) Patient coming from: Home  I have personally briefly reviewed patient's old medical records in [place]  Chief Complaint: Right shoulder and right elbow swelling pain, chest pain  HPI: Patrick [name] is a 56 y.o. male with medical history significant of gout with recent gouty flare on left elbow, recent fall with right frontal lobe contusion and SAH, HTN, PVD s/p bilateral BKA, CKD stage IIIa, IDDM and morbid obesity, presented with worsening of right shoulder pain and right elbow pain.  Symptomatic gouty flareup 10 days ago, underwent left elbow aspiration, which showed synovial fluid with lymphocyte dominant picture, no crystals.  Patient does report he has had multiple gouty flareup this year along 4-5 times before today's.  Most occasions with his gouty flareup most flareups only involve right-sided upper either elbow or shoulder joint.  He was started on p.o. steroid for 7 days which completed about 3 days ago.  No tapering.  Then 2 days ago, he started to have sweating pain and warm to touch on right shoulder and right elbow, very limited mobility of both joint with pain denies any fever or chills.  He also complained of pleuritic chest pain sharp-like bilaterally, associated with deep breathing, denies any shortness of breath denies any sweating or pain on other joints.  ED Course: Afebrile, not tachycardia blood pressure significantly elevated no hypoxia. CT head and neck showed stable SAH no other acute finding.  DVT study right arm negative.  Chest x-ray no acute infiltrates.  She was started empirically heparin drip in the ED.  Review of Systems: As per HPI otherwise 14 point review of systems negative.    Past Medical History:  Diagnosis Date   Anemia     Diabetes mellitus    Gout    History of COVID-19 04/2019   Hypertension    PVD (peripheral vascular disease) ([place])    s/p R BKA    Past Surgical History:  Procedure Laterality Date   AMPUTATION Right 06/06/2020   Procedure: AMPUTATION 5TH RAY;  Surgeon: [name], MD;  Location: [place];  Service: Orthopedics;  Laterality: Right;   AMPUTATION Right 06/08/2020   Procedure: RIGHT 4TH RAY AMPUTATION;  Surgeon: [name], MD;  Location: [place];  Service: Orthopedics;  Laterality: Right;   AMPUTATION Right 08/27/2020   Procedure: AMPUTATION BELOW KNEE;  Surgeon: Jessy Oto, MD;  Location: [place];  Service: Orthopedics;  Laterality: Right;   AMPUTATION Left 12/30/2020   Procedure: LEFT BELOW KNEE AMPUTATION;  Surgeon: [name], MD;  Location: [place];  Service: Orthopedics;  Laterality: Left;   CHOLECYSTECTOMY N/A 09/25/2020   Procedure: LAPAROSCOPIC CHOLECYSTECTOMY;  Surgeon: Jesusita Oka, MD;  Location: [place];  Service: General;  Laterality: N/A;     reports that he has never smoked. He has never used smokeless tobacco. He reports that he does not drink alcohol and does not use drugs.  No Known Allergies  Family History  Problem Relation Age of Onset   Diabetes Neg Hx      Prior to Admission medications   Medication Sig Start Date End Date Taking? Authorizing Provider  acetaminophen (TYLENOL) 500 MG tablet Take 1,000 mg by mouth every 6 (six) hours as needed for mild pain or headache.  [provider]  allopurinol (ZYLOPRIM) 100 MG tablet Take 1 tablet (100 mg total) by mouth 2 (two) times daily. 12/16/21   Veryl Speak, MD  allopurinol (ZYLOPRIM) 300 MG tablet Take 300 mg by mouth daily. 12/08/21   [provider]  amLODipine (NORVASC) 5 MG tablet Take 5 mg by mouth daily. 08/18/21   [provider]  ascorbic acid (VITAMIN C) 1000 MG tablet Take 1 tablet (1,000 mg total) by mouth daily. 09/08/20   Angiulli, Lavon Paganini, PA-C  blood glucose  meter kit and supplies KIT Dispense based on patient and insurance preference. Use up to four times daily as directed. 01/05/22   Little Ishikawa, MD  colchicine 0.6 MG tablet Take 2 tablets (1.2 mg) during a gout flareup, then take 1 tablet an hour later. Patient taking differently: Take 0.6-1.2 mg by mouth See admin instructions. Take 2 tablets (1.2 mg) during a gout flareup, then take 1 tablet (0.6 mg) an hour later. 12/16/21   Veryl Speak, MD  furosemide (LASIX) 20 MG tablet Take 1 tablet (20 mg total) by mouth daily as needed. Patient taking differently: Take 20 mg by mouth daily as needed for edema. 11/03/20 01/04/22  [name], MD  gabapentin (NEURONTIN) 300 MG capsule Take 1 capsule (300 mg total) by mouth 3 (three) times daily. Patient taking differently: Take 300 mg by mouth 3 (three) times daily as needed (pain). 09/08/20   Angiulli, Lavon Paganini, PA-C  insulin aspart (NOVOLOG FLEXPEN) 100 UNIT/ML FlexPen Inject 4 Units into the skin 3 (three) times daily with meals. Patient taking differently: Inject 4 Units into the skin 2 (two) times daily. 09/08/20   Angiulli, Lavon Paganini, PA-C  insulin glargine (LANTUS SOLOSTAR) 100 UNIT/ML Solostar Pen Inject 25 Units into the skin 2 (two) times daily. Patient taking differently: Inject 15 Units into the skin 2 (two) times daily. 09/08/20   Angiulli, Lavon Paganini, PA-C  Insulin Pen Needle 32G X 4 MM MISC Use as directed 09/08/20   [name], Clide Deutscher, MD  Insulin Pen Needle 32G X 4 MM MISC Use as directed 09/09/20   Angiulli, Lavon Paganini, PA-C  JARDIANCE 25 MG TABS tablet Take 25 mg by mouth daily. 09/28/21   [provider]  metoprolol tartrate (LOPRESSOR) 25 MG tablet Take 0.5 tablets (12.5 mg total) by mouth 2 (two) times daily. Patient taking differently: Take 25 mg by mouth 2 (two) times daily. 09/08/20   Angiulli, Lavon Paganini, PA-C  niMODipine (NIMOTOP) 30 MG capsule Take 2 capsules (60 mg total) by mouth every 4 (four) hours for 20 days. 01/05/22  01/25/22  Little Ishikawa, MD  OZEMPIC, 1 MG/DOSE, 4 MG/3ML SOPN Inject into the skin. 01/01/22   [provider]  predniSONE (DELTASONE) 20 MG tablet Take 2 tablets (40 mg total) by mouth daily. 12/29/21   Blanchie Dessert, MD  simvastatin (ZOCOR) 10 MG tablet Take 10 mg by mouth at bedtime. 12/08/21   [provider]  tiZANidine (ZANAFLEX) 4 MG tablet Take 1 tablet (4 mg total) by mouth 2 (two) times daily as needed for muscle spasms. 09/29/20   Lavina Hamman, MD  traZODone (DESYREL) 100 MG tablet Take 100 mg by mouth at bedtime as needed for sleep. 10/07/21   [provider]    Physical Exam: Vitals:   01/09/22 0859 01/09/22 1130 01/09/22 1259 01/09/22 1413  BP:  (!) 168/89  (!) 162/94  Pulse:  96  81  Resp:  16  18  Temp: 98.5 F (36.9 C)  99.4 F (37.4 C) 98.4 F (36.9 C)  TempSrc: Oral  Oral Oral  SpO2:  96%  100%  Weight:    (!) 141.4 kg  Height:    _0  (1.88 m)    Constitutional: NAD, calm, comfortable Vitals:   01/09/22 0859 01/09/22 1130 01/09/22 1259 01/09/22 1413  BP:  (!) 168/89  (!) 162/94  Pulse:  96  81  Resp:  16  18  Temp: 98.5 F (36.9 C)  99.4 F (37.4 C) 98.4 F (36.9 C)  TempSrc: Oral  Oral Oral  SpO2:  96%  100%  Weight:    (!) 141.4 kg  Height:    _1  (1.88 m)   Eyes: PERRL, lids and conjunctivae normal ENMT: Mucous membranes are moist. Posterior pharynx clear of any exudate or lesions.Normal dentition.  Neck: normal, supple, no masses, no thyromegaly Respiratory: clear to auscultation bilaterally, no wheezing, no crackles. Normal respiratory effort. No accessory muscle use.  Cardiovascular: Regular rate and rhythm, no murmurs / rubs / gallops. No extremity edema. 2+ pedal pulses. No carotid bruits.  Abdomen: no tenderness, no masses palpated. No hepatosplenomegaly. Bowel sounds positive.  Musculoskeletal: Right forearm 2+ pitting edema, significant tenderness warm to touch on right elbow and right shoulder  compared to left side, significant decrease of ROM on right elbow and right shoulder compared to left side Skin: no rashes, lesions, ulcers. No induration Neurologic: CN 2-12 grossly intact. Sensation intact, DTR normal. Strength 5/5 in all 4.  Psychiatric: Normal judgment and insight. Alert and oriented x 3. Normal mood.     Labs on Admission: I have personally reviewed following labs and imaging studies  CBC: Recent Labs  Lab 01/03/22 0522 01/04/22 0438 01/08/22 2055  WBC 10.2 8.0 15.7*  NEUTROABS 6.9  --  12.3*  HGB 9.4* 8.0* 8.7*  HCT 30.4* 25.6* 27.4*  MCV 88.1 87.4 85.9  PLT 411* 354 588   Basic Metabolic Panel: Recent Labs  Lab 01/03/22 0522 01/04/22 0438 01/08/22 2055  NA 140 140 134*  K 4.9 5.5* 4.4  CL 109 114* 106  CO2 15* 17* 16*  GLUCOSE 35* 159* 165*  BUN 56* 53* 37*  CREATININE 3.56* 3.26* 2.68*  CALCIUM 7.4* 7.0* 7.3*   GFR: Estimated Creatinine Clearance: 46.6 mL/min (A) (by C-G formula based on SCr of 2.68 mg/dL (H)). Liver Function Tests: Recent Labs  Lab 01/03/22 0705 01/08/22 2055  AST 37 26  ALT 20 22  ALKPHOS 86 85  BILITOT 0.3 0.9  PROT 6.4* 7.1  ALBUMIN 3.0* 3.4*   No results for input(s): "LIPASE", "AMYLASE" in the last 168 hours. No results for input(s): "AMMONIA" in the last 168 hours. Coagulation Profile: Recent Labs  Lab 01/08/22 2055  INR 1.2   Cardiac Enzymes: Recent Labs  Lab 01/04/22 0438  CKTOTAL 908*   BNP (last 3 results) No results for input(s): "PROBNP" in the last 8760 hours. HbA1C: No results for input(s): "HGBA1C" in the last 72 hours. CBG: Recent Labs  Lab 01/04/22 2009 01/05/22 0630 01/09/22 0639 01/09/22 0839 01/09/22 1151  GLUCAP 393* 192* 129* 122* 114*   Lipid Profile: No results for input(s): "CHOL", "HDL", "LDLCALC", "TRIG", "CHOLHDL", "LDLDIRECT" in the last 72 hours. Thyroid Function Tests: No results for input(s): "TSH", "T4TOTAL", "FREET4", "T3FREE", "THYROIDAB" in the last 72  hours. Anemia Panel: No results for input(s): "VITAMINB12", "FOLATE", "FERRITIN", "TIBC", "IRON", "RETICCTPCT" in the last 72 hours. Urine analysis:    Component  Value Date/Time   COLORURINE YELLOW 01/08/2022 2301   APPEARANCEUR CLEAR 01/08/2022 2301   LABSPEC 1.017 01/08/2022 2301   PHURINE 6.0 01/08/2022 2301   GLUCOSEU NEGATIVE 01/08/2022 2301   HGBUR LARGE (A) 01/08/2022 2301   HGBUR negative 01/27/2010 0934   BILIRUBINUR NEGATIVE 01/08/2022 2301   KETONESUR TRACE (A) 01/08/2022 2301   PROTEINUR 100 (A) 01/08/2022 2301   UROBILINOGEN 0.2 10/26/2013 1130   NITRITE NEGATIVE 01/08/2022 2301   LEUKOCYTESUR NEGATIVE 01/08/2022 2301    Radiological Exams on Admission: CT Head Wo Contrast  Result Date: 01/09/2022 CLINICAL DATA:  Fall, head and neck trauma. EXAM: CT HEAD WITHOUT CONTRAST CT CERVICAL SPINE WITHOUT CONTRAST TECHNIQUE: Multidetector CT imaging of the head and cervical spine was performed following the standard protocol without intravenous contrast. Multiplanar CT image reconstructions of the cervical spine were also generated. RADIATION DOSE REDUCTION: This exam was performed according to the departmental dose-optimization program which includes automated exposure control, adjustment of the mA and/or kV according to patient size and/or use of iterative reconstruction technique. COMPARISON:  01/03/2022. FINDINGS: CT HEAD FINDINGS Brain: A tiny 5 mm hyperdense focus is noted in the frontal lobe on the right, not significantly changed from the prior exam. No midline shift or mass effect. Periventricular white matter hypodensities are present bilaterally. No hydrocephalus. Vascular: No hyperdense vessel or unexpected calcification. Skull: Normal. Negative for fracture or focal lesion. Sinuses/Orbits: Mild mucosal thickening in the ethmoid air cells and left maxillary sinus. Soft tissue swelling is noted over the left orbit and frontal bone. Fractures of the medial orbital wall and  inferior orbital wall on the left are unchanged. Other: None. CT CERVICAL SPINE FINDINGS Alignment: Normal. Skull base and vertebrae: No acute fracture. No primary bone lesion or focal pathologic process. Soft tissues and spinal canal: No prevertebral fluid or swelling. No visible canal hematoma. Disc levels: Intervertebral disc space narrowing and degenerative endplate changes are present at C5-C6 and C6-C7. Upper chest: No acute abnormality. Other: Carotid artery calcifications. IMPRESSION: 1. Essentially stable small hemorrhage/contusion in the frontal lobe on the right. No midline shift or mass effect. 2. Chronic microvascular ischemic changes. 3. Medial and inferior orbital wall fractures on the left, unchanged. 4. Mild degenerative changes in the cervical spine without evidence of acute fracture. Electronically Signed   By: Brett Fairy M.D.   On: 01/09/2022 01:05   CT Cervical Spine Wo Contrast  Result Date: 01/09/2022 CLINICAL DATA:  Fall, head and neck trauma. EXAM: CT HEAD WITHOUT CONTRAST CT CERVICAL SPINE WITHOUT CONTRAST TECHNIQUE: Multidetector CT imaging of the head and cervical spine was performed following the standard protocol without intravenous contrast. Multiplanar CT image reconstructions of the cervical spine were also generated. RADIATION DOSE REDUCTION: This exam was performed according to the departmental dose-optimization program which includes automated exposure control, adjustment of the mA and/or kV according to patient size and/or use of iterative reconstruction technique. COMPARISON:  01/03/2022. FINDINGS: CT HEAD FINDINGS Brain: A tiny 5 mm hyperdense focus is noted in the frontal lobe on the right, not significantly changed from the prior exam. No midline shift or mass effect. Periventricular white matter hypodensities are present bilaterally. No hydrocephalus. Vascular: No hyperdense vessel or unexpected calcification. Skull: Normal. Negative for fracture or focal lesion.  Sinuses/Orbits: Mild mucosal thickening in the ethmoid air cells and left maxillary sinus. Soft tissue swelling is noted over the left orbit and frontal bone. Fractures of the medial orbital wall and inferior orbital wall on the left are unchanged. Other:  None. CT CERVICAL SPINE FINDINGS Alignment: Normal. Skull base and vertebrae: No acute fracture. No primary bone lesion or focal pathologic process. Soft tissues and spinal canal: No prevertebral fluid or swelling. No visible canal hematoma. Disc levels: Intervertebral disc space narrowing and degenerative endplate changes are present at C5-C6 and C6-C7. Upper chest: No acute abnormality. Other: Carotid artery calcifications. IMPRESSION: 1. Essentially stable small hemorrhage/contusion in the frontal lobe on the right. No midline shift or mass effect. 2. Chronic microvascular ischemic changes. 3. Medial and inferior orbital wall fractures on the left, unchanged. 4. Mild degenerative changes in the cervical spine without evidence of acute fracture. Electronically Signed   By: Brett Fairy M.D.   On: 01/09/2022 01:05   US Venous Img Upper Uni Right(DVT)  Result Date: 01/08/2022 CLINICAL DATA:  Right arm swelling EXAM: RIGHT UPPER EXTREMITY VENOUS DOPPLER ULTRASOUND TECHNIQUE: Gray-scale sonography with graded compression, as well as color Doppler and duplex ultrasound were performed to evaluate the upper extremity deep venous system from the level of the subclavian vein and including the jugular, axillary, basilic, radial, ulnar and upper cephalic vein. Spectral Doppler was utilized to evaluate flow at rest and with distal augmentation maneuvers. COMPARISON:  None Available. FINDINGS: Contralateral Subclavian Vein: Respiratory phasicity is normal and symmetric with the symptomatic side. No evidence of thrombus. Normal compressibility. Internal Jugular Vein: No evidence of thrombus. Normal compressibility, respiratory phasicity and response to augmentation.  Subclavian Vein: No evidence of thrombus. Normal compressibility, respiratory phasicity and response to augmentation. Axillary Vein: No evidence of thrombus. Normal compressibility, respiratory phasicity and response to augmentation. Cephalic Vein: No evidence of thrombus. Normal compressibility, respiratory phasicity and response to augmentation. Basilic Vein: No evidence of thrombus. Normal compressibility, respiratory phasicity and response to augmentation. Brachial Veins: No evidence of thrombus. Normal compressibility, respiratory phasicity and response to augmentation. Radial Veins: No evidence of thrombus. Normal compressibility, respiratory phasicity and response to augmentation. Ulnar Veins: No evidence of thrombus. Normal compressibility, respiratory phasicity and response to augmentation. Venous Reflux:  None visualized. Other Findings:  None visualized. IMPRESSION: No evidence of DVT within the right upper extremity. Electronically Signed   By: Rolm Baptise M.D.   On: 01/08/2022 23:01   DG Forearm Right  Result Date: 01/08/2022 CLINICAL DATA:  Fall.  Pain. EXAM: RIGHT FOREARM - 2 VIEW; RIGHT ELBOW - COMPLETE 3+ VIEW; RIGHT HAND - COMPLETE 3+ VIEW COMPARISON:  None Available. FINDINGS: Right elbow: Moderate peripheral medial elbow coronoid process greater than trochlear degenerative spurring. Moderate degenerative spurring at the tip of the coronoid process at the volar elbow. 5 mm well corticated chronic ossicle just distal to the tip of the coronoid process on lateral view. Mild to moderate radiocapitellar joint space narrowing. Visualization of the posterior distal humeral fat pad suggesting an elbow joint effusion. Mild chronic enthesopathic spurring at the triceps insertion on the olecranon. Mild chronic enthesopathic change at the common extensor tendon origin at the lateral epicondyle. No acute fracture is seen.  No dislocation. -- Right forearm: No acute fracture is seen within the more  distal aspect of the radius or ulna. -- Right hand: Mild triscaphe and thumb carpometacarpal joint space narrowing. Mild second through fifth DIP joint space narrowing. Mild flexion of the fifth finger DIP joint on the provided images. No acute fracture is seen. No dislocation. Mild vascular calcifications. IMPRESSION: 1. No acute fracture is seen within the right elbow, forearm, or hand. 2. Moderate elbow osteoarthritis. 3. Mild flexion of the fifth finger DIP joint  on the provided images. Correlate for extensor tendon injury. 4. Mild triscaphe and thumb carpometacarpal osteoarthritis. Electronically Signed   By: Yvonne Kendall M.D.   On: 01/08/2022 21:57   DG Elbow Complete Right  Result Date: 01/08/2022 CLINICAL DATA:  Fall.  Pain. EXAM: RIGHT FOREARM - 2 VIEW; RIGHT ELBOW - COMPLETE 3+ VIEW; RIGHT HAND - COMPLETE 3+ VIEW COMPARISON:  None Available. FINDINGS: Right elbow: Moderate peripheral medial elbow coronoid process greater than trochlear degenerative spurring. Moderate degenerative spurring at the tip of the coronoid process at the volar elbow. 5 mm well corticated chronic ossicle just distal to the tip of the coronoid process on lateral view. Mild to moderate radiocapitellar joint space narrowing. Visualization of the posterior distal humeral fat pad suggesting an elbow joint effusion. Mild chronic enthesopathic spurring at the triceps insertion on the olecranon. Mild chronic enthesopathic change at the common extensor tendon origin at the lateral epicondyle. No acute fracture is seen.  No dislocation. -- Right forearm: No acute fracture is seen within the more distal aspect of the radius or ulna. -- Right hand: Mild triscaphe and thumb carpometacarpal joint space narrowing. Mild second through fifth DIP joint space narrowing. Mild flexion of the fifth finger DIP joint on the provided images. No acute fracture is seen. No dislocation. Mild vascular calcifications. IMPRESSION: 1. No acute fracture is  seen within the right elbow, forearm, or hand. 2. Moderate elbow osteoarthritis. 3. Mild flexion of the fifth finger DIP joint on the provided images. Correlate for extensor tendon injury. 4. Mild triscaphe and thumb carpometacarpal osteoarthritis. Electronically Signed   By: Yvonne Kendall M.D.   On: 01/08/2022 21:57   DG Hand Complete Right  Result Date: 01/08/2022 CLINICAL DATA:  Fall.  Pain. EXAM: RIGHT FOREARM - 2 VIEW; RIGHT ELBOW - COMPLETE 3+ VIEW; RIGHT HAND - COMPLETE 3+ VIEW COMPARISON:  None Available. FINDINGS: Right elbow: Moderate peripheral medial elbow coronoid process greater than trochlear degenerative spurring. Moderate degenerative spurring at the tip of the coronoid process at the volar elbow. 5 mm well corticated chronic ossicle just distal to the tip of the coronoid process on lateral view. Mild to moderate radiocapitellar joint space narrowing. Visualization of the posterior distal humeral fat pad suggesting an elbow joint effusion. Mild chronic enthesopathic spurring at the triceps insertion on the olecranon. Mild chronic enthesopathic change at the common extensor tendon origin at the lateral epicondyle. No acute fracture is seen.  No dislocation. -- Right forearm: No acute fracture is seen within the more distal aspect of the radius or ulna. -- Right hand: Mild triscaphe and thumb carpometacarpal joint space narrowing. Mild second through fifth DIP joint space narrowing. Mild flexion of the fifth finger DIP joint on the provided images. No acute fracture is seen. No dislocation. Mild vascular calcifications. IMPRESSION: 1. No acute fracture is seen within the right elbow, forearm, or hand. 2. Moderate elbow osteoarthritis. 3. Mild flexion of the fifth finger DIP joint on the provided images. Correlate for extensor tendon injury. 4. Mild triscaphe and thumb carpometacarpal osteoarthritis. Electronically Signed   By: Yvonne Kendall M.D.   On: 01/08/2022 21:57   DG Chest Port 1  View  Result Date: 01/08/2022 CLINICAL DATA:  Status post fall with bilateral rib pain. EXAM: PORTABLE CHEST 1 VIEW COMPARISON:  September 22, 2020 FINDINGS: The heart size and mediastinal contours are within normal limits. Both lungs are clear. No acute osseous abnormalities are identified. Marked severity degenerative changes are seen involving the left shoulder.  Degenerative changes are seen within the mid and lower thoracic spine. IMPRESSION: No active cardiopulmonary disease. Electronically Signed   By: Virgina Norfolk M.D.   On: 01/08/2022 21:53    EKG: Independently reviewed.  Sinus, no acute ST changes.  Assessment/Plan Principal Problem:   Right arm cellulitis  (please populate well all problems here in Problem List. (For example, if patient is on BP meds at home and you resume or decide to hold them, it is a problem that needs to be her. Same for CAD, COPD, HLD and so on)  Acute gouty flareup -This time, on right elbow and right shoulder -More than 4-5 gouty flareups this year, most recent flareup involved with left elbow and was on 7 days course of steroid which completed 3 to 4 days ago without tapering.  This time, will resume p.o. steroid, consider slow tapering to avoid further flareup. -We will also adjust his allopurinol dosage, increased to 150 mg twice daily, recheck uric acid in 3 to 4 weeks to further adjust dosage of allopurinol.  Explained to the patient who expressed understanding and agreed. -DVT ruled out, no rash found, cellulitis ruled out.  SIRS -Secondary to gouty flareup, low suspicion for sepsis, antibiotics discontinued.  Chest pains -Likely costochondritis, very pleural, doubt PE, given the most recent history of intracranial bleed 1 week ago after mechanical fall secondary to hypoglycemia, will discontinue heparin drip only if we have positive evidence of PE by VQ scan.  Recent SAH and right frontal lobe hemorrhagic contusion -Stable on CT scan, discontinue  heparin drip -Continue nimodipine for another 2 weeks.  Hypokalemia -P.o. replacement  IDDM  -Glucose fairly controlled, continue current insulin regimen  HTN -BP elevated, resume home BP meds, add as needed hydralazine  CKD stage IIIa -Euvolemic, no acute concern  Obesity -Recommend calorie control  DVT prophylaxis: Heparin subcu Code Status: Full code Family Communication: None at bedside Disposition Plan: Severe gouty flareup, requiring IV pain meds and close monitoring to prevent rib flareup, expect more than 2 midnight hospital stay Consults called: None Admission status: Tele admit   Lequita Halt MD [facility] Pager [phone]  01/09/2022, 3:42 PM

## 2022-01-09 NOTE — ED Provider Notes (Signed)
Blood pressure has been significantly elevated, over 739 systolic.  I have ordered a dose of hydralazine.   Delora Fuel, MD 58/44/17 [phone]

## 2022-01-09 NOTE — ED Provider Notes (Signed)
He had an episode of an irregular rhythm with wide complexes with rate of about 100.  I suspect that this is paroxysmal atrial fibrillation, doubt ventricular tachycardia as the rhythm is very irregular.  He has returned to sinus rhythm with sinus arrhythmia.   Delora Fuel, MD 50/56/97 [phone]

## 2022-01-09 NOTE — Progress Notes (Signed)
ANTICOAGULATION CONSULT NOTE  Pharmacy Consult for Heparin  Indication: pulmonary embolus (rule out)  No Known Allergies  Patient Measurements: Height: 6\' 2"  (188 cm) Weight: 136 kg (299 lb 13.2 oz) IBW/kg (Calculated) : 82.2  Vital Signs: Temp: 98.5 F (36.9 C) (11/21 0859) Temp Source: Oral (11/21 0859) BP: 168/89 (11/21 1130) Pulse Rate: 96 (11/21 1130)  Labs: Recent Labs    01/08/22 2055 01/08/22 2150 01/09/22 0017 01/09/22 0959  HGB 8.7*  --   --   --   HCT 27.4*  --   --   --   PLT 344  --   --   --   APTT 30  --   --   --   LABPROT 15.0  --   --   --   INR 1.2  --   --   --   HEPARINUNFRC  --   --   --  <0.10*  CREATININE 2.68*  --   --   --   TROPONINIHS  --  52* 44*  --    Estimated Creatinine Clearance: 45.7 mL/min (A) (by C-G formula based on SCr of 2.68 mg/dL (H)).  Assessment: 56 y/o M with pain in bilateral ribs/fall. D-dimer is elevated. Starting heparin for rule out pulmonary embolus. VQ scan ordered.  Hgb 8.7. Noted renal dysfunction. Initial heparin level is undetectable. No bleeding noted.   Goal of Therapy:  Heparin level 0.3-0.7 units/ml Monitor platelets by anticoagulation protocol: Yes   Plan:  Heparin bolus 3000 units IV x 1 Increase heparin gtt to 2000 units/hr Check a 6 hr heparin level Daily heparin level and CBC  Salome Arnt, PharmD, BCPS, BCEMP Clinical Pharmacist Please see AMION for all pharmacy numbers 01/09/2022 12:51 PM

## 2022-01-09 NOTE — ED Notes (Signed)
Dr. Roxanne Mins notified of pt.'s BP

## 2022-01-09 NOTE — Progress Notes (Signed)
ANTICOAGULATION CONSULT NOTE - Initial Consult  Pharmacy Consult for Heparin  Indication: pulmonary embolus (rule out)  No Known Allergies  Patient Measurements: Height: 6\' 2"  (188 cm) Weight: 136 kg (299 lb 13.2 oz) IBW/kg (Calculated) : 82.2  Vital Signs: Temp: 98.1 F (36.7 C) (11/21 0329) Temp Source: Oral (11/21 0329) BP: 201/84 (11/21 0445) Pulse Rate: 91 (11/21 0345)  Labs: Recent Labs    01/08/22 2055 01/08/22 2150 01/09/22 0017  HGB 8.7*  --   --   HCT 27.4*  --   --   PLT 344  --   --   APTT 30  --   --   LABPROT 15.0  --   --   INR 1.2  --   --   CREATININE 2.68*  --   --   TROPONINIHS  --  52* 44*    Estimated Creatinine Clearance: 45.7 mL/min (A) (by C-G formula based on SCr of 2.68 mg/dL (H)).   Medical History: Past Medical History:  Diagnosis Date   Anemia    Diabetes mellitus    Gout    History of COVID-19 04/2019   Hypertension    PVD (peripheral vascular disease) ([place])    s/p R BKA    Assessment: 56 y/o M with pain in bilateral ribs/fall. D-dimer is elevated. Starting heparin for rule out pulmonary embolus. VQ scan ordered.  Hgb 8.7. Noted renal dysfunction.   Goal of Therapy:  Heparin level 0.3-0.7 units/ml Monitor platelets by anticoagulation protocol: Yes   Plan:  Heparin 6000 units BOLUS Start heparin drip at 1600 units/hr 8 hour heparin level Daily CBC/Heparin level Monitor for bleeding  Narda Bonds, PharmD, BCPS Clinical Pharmacist Phone: [phone]

## 2022-01-09 NOTE — Progress Notes (Signed)
Pharmacy Antibiotic Note  Patrick [name] is a 56 y.o. male admitted on 01/08/2022 with cellulitis. Pharmacy has been consulted for vancomycin dosing. Pt with Tmax 100.9 and WBC is elevated at 15.7. SCr is elevated and lactic acid is normal. He received first doses last night.   Plan: Vancomycin 1250mg  IV Q24H  F/u renal fxn, C&S, clinical status and peak/trough at SS  Height: 6\' 2"  (188 cm) Weight: 136 kg (299 lb 13.2 oz) IBW/kg (Calculated) : 82.2  Temp (24hrs), Avg:99.2 F (37.3 C), Min:98.1 F (36.7 C), Max:100.9 F (38.3 C)  Recent Labs  Lab 01/03/22 0522 01/03/22 0700 01/04/22 0438 01/08/22 2055 01/09/22 0017  WBC 10.2  --  8.0 15.7*  --   CREATININE 3.56*  --  3.26* 2.68*  --   LATICACIDVEN  --  0.8  --  0.9 0.7    Estimated Creatinine Clearance: 45.7 mL/min (A) (by C-G formula based on SCr of 2.68 mg/dL (H)).    No Known Allergies  Antimicrobials this admission: Vanc 11/20>> Cefepime 11/20>>  Dose adjustments this admission: N/A  Microbiology results: Pending  Thank you for allowing pharmacy to be a part of this patient's care.  Bonifacio Pruden, Rande Lawman 01/09/2022 12:52 PM

## 2022-01-09 NOTE — ED Notes (Signed)
Handoff report given to [facility] 

## 2022-01-09 NOTE — ED Notes (Signed)
Pt. To CT. Dr. Armandina Gemma states he will put in an U/S guided IV for heparin drip.

## 2022-01-10 ENCOUNTER — Inpatient Hospital Stay (HOSPITAL_COMMUNITY): Payer: BC Managed Care – PPO

## 2022-01-10 LAB — CBC
HCT: 25.5 % — ABNORMAL LOW (ref 39.0–52.0)
Hemoglobin: 7.9 g/dL — ABNORMAL LOW (ref 13.0–17.0)
MCH: 27 pg (ref 26.0–34.0)
MCHC: 31 g/dL (ref 30.0–36.0)
MCV: 87 fL (ref 80.0–100.0)
Platelets: 344 10*3/uL (ref 150–400)
RBC: 2.93 MIL/uL — ABNORMAL LOW (ref 4.22–5.81)
RDW: 14.5 % (ref 11.5–15.5)
WBC: 16.1 10*3/uL — ABNORMAL HIGH (ref 4.0–10.5)
nRBC: 0 % (ref 0.0–0.2)

## 2022-01-10 LAB — GLUCOSE, CAPILLARY
Glucose-Capillary: 160 mg/dL — ABNORMAL HIGH (ref 70–99)
Glucose-Capillary: 168 mg/dL — ABNORMAL HIGH (ref 70–99)
Glucose-Capillary: 168 mg/dL — ABNORMAL HIGH (ref 70–99)
Glucose-Capillary: 175 mg/dL — ABNORMAL HIGH (ref 70–99)
Glucose-Capillary: 194 mg/dL — ABNORMAL HIGH (ref 70–99)
Glucose-Capillary: 303 mg/dL — ABNORMAL HIGH (ref 70–99)
Glucose-Capillary: 363 mg/dL — ABNORMAL HIGH (ref 70–99)

## 2022-01-10 LAB — URINE CULTURE: Culture: NO GROWTH

## 2022-01-10 LAB — BASIC METABOLIC PANEL
Anion gap: 14 (ref 5–15)
BUN: 35 mg/dL — ABNORMAL HIGH (ref 6–20)
CO2: 14 mmol/L — ABNORMAL LOW (ref 22–32)
Calcium: 7.4 mg/dL — ABNORMAL LOW (ref 8.9–10.3)
Chloride: 108 mmol/L (ref 98–111)
Creatinine, Ser: 3.01 mg/dL — ABNORMAL HIGH (ref 0.61–1.24)
GFR, Estimated: 24 mL/min — ABNORMAL LOW (ref >60)
Glucose, Bld: 188 mg/dL — ABNORMAL HIGH (ref 70–99)
Potassium: 4.6 mmol/L (ref 3.5–5.1)
Sodium: 136 mmol/L (ref 135–145)

## 2022-01-10 MED ORDER — GUAIFENESIN 100 MG/5ML PO LIQD
5.0000 mL | ORAL | Status: DC | PRN
  Administered 2022-01-10: 5 mL via ORAL
  Filled 2022-01-10: qty 15

## 2022-01-10 MED ORDER — TECHNETIUM TO 99M ALBUMIN AGGREGATED
4.3000 | Freq: Once | INTRAVENOUS | Status: AC | PRN
  Administered 2022-01-10: 4.3 via INTRAVENOUS

## 2022-01-10 MED ORDER — HEPARIN SODIUM (PORCINE) 5000 UNIT/ML IJ SOLN
5000.0000 [IU] | Freq: Three times a day (TID) | INTRAMUSCULAR | Status: DC
  Administered 2022-01-10 – 2022-01-11 (×2): 5000 [IU] via SUBCUTANEOUS
  Filled 2022-01-10 (×2): qty 1

## 2022-01-10 MED ORDER — OXYCODONE HCL 5 MG PO TABS
5.0000 mg | ORAL_TABLET | ORAL | Status: DC | PRN
  Administered 2022-01-10: 5 mg via ORAL
  Filled 2022-01-10: qty 1

## 2022-01-10 NOTE — Progress Notes (Addendum)
  Transition of Care [facility]) Screening Note   Patient Details  Name: Patrick [name] Date of Birth: [birth year]   Transition of Care [facility]) CM/SW Contact:    Tom-Johnson, Renea Ee, RN Phone Number: 01/10/2022, 10:10 AM  Patient is admitted for Rt arm Cellulitis, on IV abx. Has hx of Gout and Bilat BKA with Prosthesis. Patient recently had a fall. From home with a friend.  Transition of Care Department [facility]) has reviewed patient and no TOC needs or recommendations have been identified at this time. TOC will continue to monitor patient advancement through interdisciplinary progression rounds. If new patient transition needs arise, please place a TOC consult.

## 2022-01-10 NOTE — Progress Notes (Signed)
  Transition of Care [facility]) Screening Note   Patient Details  Name: Patrick [name] Date of Birth: Feb 15, 1966   Transition of Care [facility]) CM/SW Contact:    Tom-Johnson, Renea Ee, RN Phone Number: 01/10/2022, 10:08 AM  Patient is admitted for Rt arm Cellulitis. Has hx of Gout. Patient recently had a fall  Transition of Care Department [facility]) has reviewed patient and no TOC needs have been identified at this time. We will continue to monitor patient advancement through interdisciplinary progression rounds. If new patient transition needs arise, please place a TOC consult.

## 2022-01-10 NOTE — Progress Notes (Signed)
PROGRESS NOTE    Patrick [name]  YKD:[id number] DOB: 08-31-1965 DOA: 01/08/2022 PCP: Beaulah Corin, [facility]    Brief Narrative:   Patrick [name] is a 56 y.o. male with past medical history significant for gout with recent flare, recent fall with right frontal lobe contusion/SAH, essential hypertension, PVD s/p bilateral BKA, CKD stage IIIa, insulin-dependent diabetes mellitus, morbid obesity who presented to [place] ED on 11/20 with complaints of right elbow/shoulder pain with concern for gout flare.  Patient reports recent gout flare 10 days prior underwent left elbow aspiration which showed synovial fluid with lymphocyte predominant picture without crystals.  Patient does report he has had multiple gouty flareups this year, 4-5 [place].  He was started on oral steroid for 7 days which she completed 3 days prior to admission without any tapering.  Additionally patient reports decreased range of motion due to pain of his right elbow and shoulder.  He also complained of pleuritic chest pain bilaterally associated with deep breathing but denies any shortness of breath.  In the ED, temperature 98.5 F, HR 96, RR 16, BP 160/89, SpO2 96% on room air.  WBC 15.7, hemoglobin 8.7, platelets 344.  Sodium 134, potassium 4.4, chloride 106, CO2 16, glucose 165, BUN 37, creatinine 2.68.  Lactic acid 0.9.  Urinalysis with negative leukocytes, negative nitrite, 0-5 WBCs.  CT head/C-spine without contrast with stable small hemorrhage/contusion right frontal lobe with no midline shift or mass effect, chronic microvascular ischemic changes, medial and inferior orbital wall fractures on the left which is unchanged, no evidence of acute fracture C-spine.  Right elbow/forearm/hand with no acute fracture within the right elbow, forearm or hand with moderate elbow osteoarthritis.  Minix duplex right upper extremity ultrasound negative for DVT.  Chest x-ray with no acute infiltrates.  Assessment & Plan:   Acute gouty  arthritis Patient reports increased pain with decreased range of motion to right elbow and right shoulder which is similar to previous acute gout flares.  Recently finished 7-day course of prednisone without tapering. -- Allopurinol increase to 100 mg p.o. twice daily -- Continue prednisone 40 mg p.o. daily; with plan 7-day course followed by slow taper on discharge  Noncardiac chest pain Etiology likely costochondritis.  Nuclear medicine VQ scan negative for pulmonary embolism.  Hypokalemia Repleted during hospitalization.  Recent subarachnoid hemorrhage Right frontal lobe hemorrhagic contusion Repeat CT head on this admission shows area of concern stable.  Etiology from previous fall from reported hypoglycemic episode.  Denies any symptoms. -- Continue nimodipine 60 mg every 4 hours  Left eyebrow/lid laceration Repaired previously in the ED on 11/15 with 5 simple interrupted sutures.  Left orbital fracture Evaluated by ENT, Dr. Janace Hoard on 11/15 who recommended outpatient follow-up with ENT 1 week.  Hyponatremia Sodium 134 on admission, likely secondary to dehydration.  Improved to 136 with IV fluids.  Now resolved..  Essential hypertension -- Metoprolol tartrate 25 mg p.o. twice daily -- Amlodipine 5 mg p.o. daily  Type 2 diabetes mellitus Hemoglobin A1c 6.7 on 01/04/2022, well-controlled.  Home medications include Lantus 15 units twice daily, NovoLog 4 units 3 times daily with meals, Ozempic, Jardiance 25 mg p.o. daily.  -- Continue Jardiance 25 mg p.o. daily -- Holding home Lantus/NovoLog; recent history of hypoglycemia -- Sensitive SSI for coverage -- CBG before every meal/at bedtime  Hyperlipidemia --Simvastatin 10 mg p.o. daily  Morbidly obese Body mass index is 40.02 kg/m.  Discussed with patient needs for aggressive lifestyle changes/weight loss as this complicates all facets  of care.  Outpatient follow-up with PCP.     DVT prophylaxis: heparin injection 5,000 Units  Start: 01/10/22 2200    Code Status: Prior Family Communication:   Disposition Plan:  Level of care: Telemetry Medical Status is: Inpatient Remains inpatient appropriate because: Anticipate discharge home likely tomorrow    Consultants:  None  Procedures:  Clear medicine VQ scan  Antimicrobials:  Vancomycin 11/20 - 11/21 Cefepime 11/20 - 11/21   Subjective: Patient seen examined bedside, resting comfortably.  Lying in bed.  Just returned from VQ scan.  Reports better range of motion with right shoulder and elbow.  Asking when he can likely go home.  Discussed with him if he continues to make improvement anticipate discharge home tomorrow with prolonged steroid taper.  No other questions or concerns at this time.  Denies headache, no dizziness, no chest pain, no palpitations, no shortness of breath, no abdominal pain, no focal weakness, no fever/chills/night sweats, no nausea/vomiting/diarrhea, no paresthesias.  No acute events overnight per nursing staff.  Objective: Vitals:   01/09/22 2205 01/10/22 0033 01/10/22 0433 01/10/22 1027  BP: (!) 164/90 (!) 152/85 (!) 164/80 (!) 148/68  Pulse:  72 67 70  Resp: 18 18 18    Temp: 99.6 F (37.6 C) 99.4 F (37.4 C) 98.4 F (36.9 C) 98.6 F (37 C)  TempSrc: Oral Oral Oral Oral  SpO2: 98% 98% 99% 98%  Weight:      Height:        Intake/Output Summary (Last 24 hours) at 01/10/2022 1342 Last data filed at 01/10/2022 0523 Gross per 24 hour  Intake 1200 ml  Output 950 ml  Net 250 ml   Filed Weights   01/08/22 1952 01/09/22 1413  Weight: 136 kg (!) 141.4 kg    Examination:  Physical Exam: GEN: NAD, alert and oriented x 3, obese HEENT: NCAT, PERRL, EOMI, sclera clear, MMM PULM: CTAB w/o wheezes/crackles, normal respiratory effort, on room air CV: RRR w/o M/G/R GI: abd soft, NTND, NABS, no R/G/M MSK: no peripheral edema, bilateral BKA's noted, moves all extremities independently NEURO: CN II-XII intact, no focal deficits,  sensation to light touch intact PSYCH: normal mood/affect Integumentary: dry/intact, no rashes or wounds    Data Reviewed: I have personally reviewed following labs and imaging studies  CBC: Recent Labs  Lab 01/04/22 0438 01/08/22 2055 01/10/22 0407  WBC 8.0 15.7* 16.1*  NEUTROABS  --  12.3*  --   HGB 8.0* 8.7* 7.9*  HCT 25.6* 27.4* 25.5*  MCV 87.4 85.9 87.0  PLT 354 344 557   Basic Metabolic Panel: Recent Labs  Lab 01/04/22 0438 01/08/22 2055 01/10/22 0407  NA 140 134* 136  K 5.5* 4.4 4.6  CL 114* 106 108  CO2 17* 16* 14*  GLUCOSE 159* 165* 188*  BUN 53* 37* 35*  CREATININE 3.26* 2.68* 3.01*  CALCIUM 7.0* 7.3* 7.4*   GFR: Estimated Creatinine Clearance: 41.5 mL/min (A) (by C-G formula based on SCr of 3.01 mg/dL (H)). Liver Function Tests: Recent Labs  Lab 01/08/22 2055  AST 26  ALT 22  ALKPHOS 85  BILITOT 0.9  PROT 7.1  ALBUMIN 3.4*   No results for input(s): "LIPASE", "AMYLASE" in the last 168 hours. No results for input(s): "AMMONIA" in the last 168 hours. Coagulation Profile: Recent Labs  Lab 01/08/22 2055  INR 1.2   Cardiac Enzymes: Recent Labs  Lab 01/04/22 0438 01/09/22 1621  CKTOTAL 908* 461*   BNP (last 3 results) No results for input(s): "PROBNP"  in the last 8760 hours. HbA1C: No results for input(s): "HGBA1C" in the last 72 hours. CBG: Recent Labs  Lab 01/09/22 2047 01/10/22 0040 01/10/22 0418 01/10/22 0728 01/10/22 1111  GLUCAP 237* 175* 168* 168* 160*   Lipid Profile: No results for input(s): "CHOL", "HDL", "LDLCALC", "TRIG", "CHOLHDL", "LDLDIRECT" in the last 72 hours. Thyroid Function Tests: No results for input(s): "TSH", "T4TOTAL", "FREET4", "T3FREE", "THYROIDAB" in the last 72 hours. Anemia Panel: No results for input(s): "VITAMINB12", "FOLATE", "FERRITIN", "TIBC", "IRON", "RETICCTPCT" in the last 72 hours. Sepsis Labs: Recent Labs  Lab 01/08/22 2055 01/09/22 0017  LATICACIDVEN 0.9 0.7    Recent Results  (from the past 240 hour(s))  Blood Culture (routine x 2)     Status: None (Preliminary result)   Collection Time: 01/08/22  8:55 PM   Specimen: BLOOD  Result Value Ref Range Status   Specimen Description   Final    BLOOD Performed at [facility], [address] [place], [place] [address]    Special Requests   Final    NONE Performed at [facility], [address], [place], [place] [address]    Culture   Final    NO GROWTH < 24 HOURS Performed at [facility], [place] [address] [place], [place] [address]    Report Status PENDING  Incomplete  Urine Culture     Status: None   Collection Time: 01/08/22 11:01 PM   Specimen: In/Out Cath Urine  Result Value Ref Range Status   Specimen Description   Final    IN/OUT CATH URINE Performed at [facility], [address], [place], [place] [address]    Special Requests   Final    NONE Performed at [facility], [address] [place], [place] [address]    Culture   Final    NO GROWTH Performed at [facility], [place] [address] [place],  [address]    Report Status 01/10/2022 FINAL  Final  Resp Panel by RT-PCR (Flu A&B, Covid) Anterior Nasal Swab     Status: None   Collection Time: 01/08/22 11:01 PM   Specimen: Anterior Nasal Swab  Result Value Ref Range Status   SARS Coronavirus 2 by RT PCR NEGATIVE NEGATIVE Final    Comment: (NOTE) SARS-CoV-2 target nucleic acids are NOT DETECTED.  The SARS-CoV-2 RNA is generally detectable in upper respiratory specimens during the acute phase of infection. The lowest concentration of SARS-CoV-2 viral copies this assay can detect is 138 copies/mL. A negative result does not preclude SARS-Cov-2 infection and should not be used as the sole basis for treatment or other patient management decisions. A negative result may occur with  improper specimen collection/handling, submission of specimen  other than nasopharyngeal swab, presence of viral mutation(s) within the areas targeted by this assay, and inadequate number of viral copies(<138 copies/mL). A negative result must be combined with clinical observations, patient history, and epidemiological information. The expected result is Negative.  Fact Sheet for Patients:  EntrepreneurPulse.com.au  Fact Sheet for Healthcare Providers:  IncredibleEmployment.be  This test is no t yet approved or cleared by the [place] FDA and  has been authorized for detection and/or diagnosis of SARS-CoV-2 by FDA under an Emergency Use Authorization (EUA). This EUA will remain  in effect (meaning this test can be used) for the duration of the COVID-19 declaration under Section 564(b)(1) of the Act, 21 U.S.C.section 360bbb-3(b)(1), unless the authorization is terminated  or revoked sooner.  Influenza A by PCR NEGATIVE NEGATIVE Final   Influenza B by PCR NEGATIVE NEGATIVE Final    Comment: (NOTE) The Xpert Xpress SARS-CoV-2/FLU/RSV plus assay is intended as an aid in the diagnosis of influenza from Nasopharyngeal swab specimens and should not be used as a sole basis for treatment. Nasal washings and aspirates are unacceptable for Xpert Xpress SARS-CoV-2/FLU/RSV testing.  Fact Sheet for Patients: EntrepreneurPulse.com.au  Fact Sheet for Healthcare Providers: IncredibleEmployment.be  This test is not yet approved or cleared by the [place] FDA and has been authorized for detection and/or diagnosis of SARS-CoV-2 by FDA under an Emergency Use Authorization (EUA). This EUA will remain in effect (meaning this test can be used) for the duration of the COVID-19 declaration under Section 564(b)(1) of the Act, 21 U.S.C. section 360bbb-3(b)(1), unless the authorization is terminated or revoked.  Performed at [facility], [address] [place], [place] [address]          Radiology Studies: NM Pulmonary Perfusion  Result Date: 01/10/2022 CLINICAL DATA:  [place], chest pain, pain with inspiration EXAM: NUCLEAR MEDICINE PERFUSION LUNG SCAN TECHNIQUE: Perfusion images were obtained in multiple projections after intravenous injection of radiopharmaceutical. Ventilation scans intentionally deferred if perfusion scan and chest x-ray adequate for interpretation during COVID 19 epidemic. RADIOPHARMACEUTICALS:  4.3 mCi Tc-58m MAA IV COMPARISON:  Chest x-ray 01/10/2022 FINDINGS: Planar imaging of the chest was obtained after the intravenous administration of radiotracer. There are no perfusion defects identified. Normal radiotracer distribution within the bilateral lungs. IMPRESSION: 1. No evidence of pulmonary embolus. Electronically Signed   By: Randa Ngo M.D.   On: 01/10/2022 10:04   DG CHEST PORT 1 VIEW  Result Date: 01/10/2022 CLINICAL DATA:  Pre VQ Scan EXAM: PORTABLE CHEST 1 VIEW COMPARISON:  CXR 01/08/22 FINDINGS: Cardiac and mediastinal contours are unchanged. No focal airspace opacity. No displaced rib fractures. Visualized upper abdomen is unremarkable. The visualized skeletal structures are unremarkable. IMPRESSION: No focal airspace opacity. Electronically Signed   By: Marin Roberts M.D.   On: 01/10/2022 08:49   CT Head Wo Contrast  Result Date: 01/09/2022 CLINICAL DATA:  Fall, head and neck trauma. EXAM: CT HEAD WITHOUT CONTRAST CT CERVICAL SPINE WITHOUT CONTRAST TECHNIQUE: Multidetector CT imaging of the head and cervical spine was performed following the standard protocol without intravenous contrast. Multiplanar CT image reconstructions of the cervical spine were also generated. RADIATION DOSE REDUCTION: This exam was performed according to the departmental dose-optimization program which includes automated exposure control, adjustment of the mA and/or kV according to patient size and/or use of iterative  reconstruction technique. COMPARISON:  01/03/2022. FINDINGS: CT HEAD FINDINGS Brain: A tiny 5 mm hyperdense focus is noted in the frontal lobe on the right, not significantly changed from the prior exam. No midline shift or mass effect. Periventricular white matter hypodensities are present bilaterally. No hydrocephalus. Vascular: No hyperdense vessel or unexpected calcification. Skull: Normal. Negative for fracture or focal lesion. Sinuses/Orbits: Mild mucosal thickening in the ethmoid air cells and left maxillary sinus. Soft tissue swelling is noted over the left orbit and frontal bone. Fractures of the medial orbital wall and inferior orbital wall on the left are unchanged. Other: None. CT CERVICAL SPINE FINDINGS Alignment: Normal. Skull base and vertebrae: No acute fracture. No primary bone lesion or focal pathologic process. Soft tissues and spinal canal: No prevertebral fluid or swelling. No visible canal hematoma. Disc levels: Intervertebral disc space narrowing and degenerative endplate changes are present at C5-C6 and C6-C7. Upper chest:  No acute abnormality. Other: Carotid artery calcifications. IMPRESSION: 1. Essentially stable small hemorrhage/contusion in the frontal lobe on the right. No midline shift or mass effect. 2. Chronic microvascular ischemic changes. 3. Medial and inferior orbital wall fractures on the left, unchanged. 4. Mild degenerative changes in the cervical spine without evidence of acute fracture. Electronically Signed   By: Brett Fairy M.D.   On: 01/09/2022 01:05   CT Cervical Spine Wo Contrast  Result Date: 01/09/2022 CLINICAL DATA:  Fall, head and neck trauma. EXAM: CT HEAD WITHOUT CONTRAST CT CERVICAL SPINE WITHOUT CONTRAST TECHNIQUE: Multidetector CT imaging of the head and cervical spine was performed following the standard protocol without intravenous contrast. Multiplanar CT image reconstructions of the cervical spine were also generated. RADIATION DOSE REDUCTION: This  exam was performed according to the departmental dose-optimization program which includes automated exposure control, adjustment of the mA and/or kV according to patient size and/or use of iterative reconstruction technique. COMPARISON:  01/03/2022. FINDINGS: CT HEAD FINDINGS Brain: A tiny 5 mm hyperdense focus is noted in the frontal lobe on the right, not significantly changed from the prior exam. No midline shift or mass effect. Periventricular white matter hypodensities are present bilaterally. No hydrocephalus. Vascular: No hyperdense vessel or unexpected calcification. Skull: Normal. Negative for fracture or focal lesion. Sinuses/Orbits: Mild mucosal thickening in the ethmoid air cells and left maxillary sinus. Soft tissue swelling is noted over the left orbit and frontal bone. Fractures of the medial orbital wall and inferior orbital wall on the left are unchanged. Other: None. CT CERVICAL SPINE FINDINGS Alignment: Normal. Skull base and vertebrae: No acute fracture. No primary bone lesion or focal pathologic process. Soft tissues and spinal canal: No prevertebral fluid or swelling. No visible canal hematoma. Disc levels: Intervertebral disc space narrowing and degenerative endplate changes are present at C5-C6 and C6-C7. Upper chest: No acute abnormality. Other: Carotid artery calcifications. IMPRESSION: 1. Essentially stable small hemorrhage/contusion in the frontal lobe on the right. No midline shift or mass effect. 2. Chronic microvascular ischemic changes. 3. Medial and inferior orbital wall fractures on the left, unchanged. 4. Mild degenerative changes in the cervical spine without evidence of acute fracture. Electronically Signed   By: Brett Fairy M.D.   On: 01/09/2022 01:05   US Venous Img Upper Uni Right(DVT)  Result Date: 01/08/2022 CLINICAL DATA:  Right arm swelling EXAM: RIGHT UPPER EXTREMITY VENOUS DOPPLER ULTRASOUND TECHNIQUE: Gray-scale sonography with graded compression, as well as  color Doppler and duplex ultrasound were performed to evaluate the upper extremity deep venous system from the level of the subclavian vein and including the jugular, axillary, basilic, radial, ulnar and upper cephalic vein. Spectral Doppler was utilized to evaluate flow at rest and with distal augmentation maneuvers. COMPARISON:  None Available. FINDINGS: Contralateral Subclavian Vein: Respiratory phasicity is normal and symmetric with the symptomatic side. No evidence of thrombus. Normal compressibility. Internal Jugular Vein: No evidence of thrombus. Normal compressibility, respiratory phasicity and response to augmentation. Subclavian Vein: No evidence of thrombus. Normal compressibility, respiratory phasicity and response to augmentation. Axillary Vein: No evidence of thrombus. Normal compressibility, respiratory phasicity and response to augmentation. Cephalic Vein: No evidence of thrombus. Normal compressibility, respiratory phasicity and response to augmentation. Basilic Vein: No evidence of thrombus. Normal compressibility, respiratory phasicity and response to augmentation. Brachial Veins: No evidence of thrombus. Normal compressibility, respiratory phasicity and response to augmentation. Radial Veins: No evidence of thrombus. Normal compressibility, respiratory phasicity and response to augmentation. Ulnar Veins: No evidence of thrombus. Normal  compressibility, respiratory phasicity and response to augmentation. Venous Reflux:  None visualized. Other Findings:  None visualized. IMPRESSION: No evidence of DVT within the right upper extremity. Electronically Signed   By: Rolm Baptise M.D.   On: 01/08/2022 23:01   DG Forearm Right  Result Date: 01/08/2022 CLINICAL DATA:  Fall.  Pain. EXAM: RIGHT FOREARM - 2 VIEW; RIGHT ELBOW - COMPLETE 3+ VIEW; RIGHT HAND - COMPLETE 3+ VIEW COMPARISON:  None Available. FINDINGS: Right elbow: Moderate peripheral medial elbow coronoid process greater than trochlear  degenerative spurring. Moderate degenerative spurring at the tip of the coronoid process at the volar elbow. 5 mm well corticated chronic ossicle just distal to the tip of the coronoid process on lateral view. Mild to moderate radiocapitellar joint space narrowing. Visualization of the posterior distal humeral fat pad suggesting an elbow joint effusion. Mild chronic enthesopathic spurring at the triceps insertion on the olecranon. Mild chronic enthesopathic change at the common extensor tendon origin at the lateral epicondyle. No acute fracture is seen.  No dislocation. -- Right forearm: No acute fracture is seen within the more distal aspect of the radius or ulna. -- Right hand: Mild triscaphe and thumb carpometacarpal joint space narrowing. Mild second through fifth DIP joint space narrowing. Mild flexion of the fifth finger DIP joint on the provided images. No acute fracture is seen. No dislocation. Mild vascular calcifications. IMPRESSION: 1. No acute fracture is seen within the right elbow, forearm, or hand. 2. Moderate elbow osteoarthritis. 3. Mild flexion of the fifth finger DIP joint on the provided images. Correlate for extensor tendon injury. 4. Mild triscaphe and thumb carpometacarpal osteoarthritis. Electronically Signed   By: Yvonne Kendall M.D.   On: 01/08/2022 21:57   DG Elbow Complete Right  Result Date: 01/08/2022 CLINICAL DATA:  Fall.  Pain. EXAM: RIGHT FOREARM - 2 VIEW; RIGHT ELBOW - COMPLETE 3+ VIEW; RIGHT HAND - COMPLETE 3+ VIEW COMPARISON:  None Available. FINDINGS: Right elbow: Moderate peripheral medial elbow coronoid process greater than trochlear degenerative spurring. Moderate degenerative spurring at the tip of the coronoid process at the volar elbow. 5 mm well corticated chronic ossicle just distal to the tip of the coronoid process on lateral view. Mild to moderate radiocapitellar joint space narrowing. Visualization of the posterior distal humeral fat pad suggesting an elbow joint  effusion. Mild chronic enthesopathic spurring at the triceps insertion on the olecranon. Mild chronic enthesopathic change at the common extensor tendon origin at the lateral epicondyle. No acute fracture is seen.  No dislocation. -- Right forearm: No acute fracture is seen within the more distal aspect of the radius or ulna. -- Right hand: Mild triscaphe and thumb carpometacarpal joint space narrowing. Mild second through fifth DIP joint space narrowing. Mild flexion of the fifth finger DIP joint on the provided images. No acute fracture is seen. No dislocation. Mild vascular calcifications. IMPRESSION: 1. No acute fracture is seen within the right elbow, forearm, or hand. 2. Moderate elbow osteoarthritis. 3. Mild flexion of the fifth finger DIP joint on the provided images. Correlate for extensor tendon injury. 4. Mild triscaphe and thumb carpometacarpal osteoarthritis. Electronically Signed   By: Yvonne Kendall M.D.   On: 01/08/2022 21:57   DG Hand Complete Right  Result Date: 01/08/2022 CLINICAL DATA:  Fall.  Pain. EXAM: RIGHT FOREARM - 2 VIEW; RIGHT ELBOW - COMPLETE 3+ VIEW; RIGHT HAND - COMPLETE 3+ VIEW COMPARISON:  None Available. FINDINGS: Right elbow: Moderate peripheral medial elbow coronoid process greater than trochlear degenerative spurring.  Moderate degenerative spurring at the tip of the coronoid process at the volar elbow. 5 mm well corticated chronic ossicle just distal to the tip of the coronoid process on lateral view. Mild to moderate radiocapitellar joint space narrowing. Visualization of the posterior distal humeral fat pad suggesting an elbow joint effusion. Mild chronic enthesopathic spurring at the triceps insertion on the olecranon. Mild chronic enthesopathic change at the common extensor tendon origin at the lateral epicondyle. No acute fracture is seen.  No dislocation. -- Right forearm: No acute fracture is seen within the more distal aspect of the radius or ulna. -- Right hand: Mild  triscaphe and thumb carpometacarpal joint space narrowing. Mild second through fifth DIP joint space narrowing. Mild flexion of the fifth finger DIP joint on the provided images. No acute fracture is seen. No dislocation. Mild vascular calcifications. IMPRESSION: 1. No acute fracture is seen within the right elbow, forearm, or hand. 2. Moderate elbow osteoarthritis. 3. Mild flexion of the fifth finger DIP joint on the provided images. Correlate for extensor tendon injury. 4. Mild triscaphe and thumb carpometacarpal osteoarthritis. Electronically Signed   By: Yvonne Kendall M.D.   On: 01/08/2022 21:57   DG Chest Port 1 View  Result Date: 01/08/2022 CLINICAL DATA:  Status post fall with bilateral rib pain. EXAM: PORTABLE CHEST 1 VIEW COMPARISON:  September 22, 2020 FINDINGS: The heart size and mediastinal contours are within normal limits. Both lungs are clear. No acute osseous abnormalities are identified. Marked severity degenerative changes are seen involving the left shoulder. Degenerative changes are seen within the mid and lower thoracic spine. IMPRESSION: No active cardiopulmonary disease. Electronically Signed   By: Virgina Norfolk M.D.   On: 01/08/2022 21:53        Scheduled Meds:  allopurinol  150 mg Oral BID   amLODipine  5 mg Oral Daily   ascorbic acid  1,000 mg Oral Daily   empagliflozin  25 mg Oral Daily   heparin injection (subcutaneous)  5,000 Units Subcutaneous Q8H   insulin aspart  0-9 Units Subcutaneous Q4H   metoprolol tartrate  25 mg Oral BID   niMODipine  60 mg Oral Q4H   predniSONE  50 mg Oral Q breakfast   simvastatin  10 mg Oral QHS   Continuous Infusions:  sodium chloride 10 mL/hr at 01/09/22 1253     LOS: 1 day    Time spent: 52 minutes spent on chart review, discussion with nursing staff, consultants, updating family and interview/physical exam; more than 50% of that time was spent in counseling and/or coordination of care.    Leila Schuff J [place] DO [facility] Available via Epic secure chat 7am-7pm After these hours, please refer to coverage provider listed on [email or web address] 01/10/2022, 1:42 PM

## 2022-01-11 LAB — GLUCOSE, CAPILLARY: Glucose-Capillary: 157 mg/dL — ABNORMAL HIGH (ref 70–99)

## 2022-01-11 MED ORDER — OXYCODONE HCL 5 MG PO TABS: 5.0000 mg | ORAL_TABLET | Freq: Three times a day (TID) | ORAL | 0 refills | Status: DC | PRN

## 2022-01-11 MED ORDER — PREDNISONE 10 MG PO TABS: ORAL_TABLET | ORAL | 0 refills | Status: AC

## 2022-01-11 MED ORDER — ALLOPURINOL 300 MG PO TABS: 150.0000 mg | ORAL_TABLET | Freq: Two times a day (BID) | ORAL | 2 refills | Status: DC

## 2022-01-11 NOTE — Progress Notes (Signed)
Discharge documents was givento, and explained to patient, PIV was removed. Patient requested a voucher for transport, taxi was called. Pt was discharged, left unit at 1235pm in no obvious distress.

## 2022-01-11 NOTE — TOC Transition Note (Signed)
Transition of Care [facility]) - CM/SW Discharge Note   Patient Details  Name: Patrick [name] MRN: [id number] Date of Birth: [birth year]  Transition of Care [facility]) CM/SW Contact:  Tom-Johnson, Renea Ee, RN Phone Number: 01/11/2022, 11:34 AM   Clinical Narrative:     Patient is scheduled for discharge today. Cab voucher given to RN for transportation. Patient signed [name] waiver and copy placed on chart. No further TOC needs noted.     Final next level of care: Home/Self Care Barriers to Discharge: Barriers Resolved   Patient Goals and CMS Choice Patient states their goals for this hospitalization and ongoing recovery are:: To return home CMS Medicare.gov Compare Post Acute Care list provided to:: Patient Choice offered to / list presented to : NA  Discharge Placement                Patient to be transferred to facility by: [facility]      Discharge Plan and Services                DME Arranged: N/A DME Agency: NA       HH Arranged: NA HH Agency: NA        Social Determinants of Health (SDOH) Interventions Food Insecurity Interventions: Intervention Not Indicated Housing Interventions: Intervention Not Indicated Transportation Interventions: Inpatient TOC, Taxi Voucher Given   Readmission Risk Interventions    01/10/2021   11:02 AM 08/29/2020   12:23 PM  Readmission Risk Prevention Plan  Transportation Screening Complete Complete  PCP or Specialist Appt within 3-5 Days  Complete  HRI or Home Care Consult  Complete  Social Work Consult for [place] Planning/Counseling  Complete  Palliative Care Screening  Not Applicable  Medication Review Press photographer) Complete Complete  PCP or Specialist appointment within 3-5 days of discharge Complete   HRI or [place] Complete   SW Recovery Care/Counseling Consult Complete   Palliative Care Screening Not [place] Complete

## 2022-01-11 NOTE — Discharge Summary (Signed)
Physician Discharge Summary  [name] HQI:[id number] DOB: 05-Jan-1966 DOA: 01/08/2022  PCP: Beaulah Corin, [facility]  Admit date: 01/08/2022 Discharge date: 01/11/2022  Admitted From:  Disposition:    Recommendations for Outpatient Follow-up:  Follow up with PCP in 4 days, will need suture removal to left eyebrow laceration Continue prednisone taper on discharge for acute gouty arthritis flare Allopurinol increased 250 mg p.o. twice daily Please obtain BMP/CBC, uric acid level in one week  Home Health: No Equipment/Devices: None  Discharge Condition: Stable CODE STATUS: Full code Diet recommendation: Heart healthy/consistent carb regular diet  History of present illness:  Patrick [name] is a 56 y.o. male with past medical history significant for gout with recent flare, recent fall with right frontal lobe contusion/SAH, essential hypertension, PVD s/p bilateral BKA, CKD stage IIIa, insulin-dependent diabetes mellitus, morbid obesity who presented to [place] ED on 11/20 with complaints of right elbow/shoulder pain with concern for gout flare.  Patient reports recent gout flare 10 days prior underwent left elbow aspiration which showed synovial fluid with lymphocyte predominant picture without crystals.  Patient does report he has had multiple gouty flareups this year, 4-5 [place].  He was started on oral steroid for 7 days which she completed 3 days prior to admission without any tapering.  Additionally patient reports decreased range of motion due to pain of his right elbow and shoulder.  He also complained of pleuritic chest pain bilaterally associated with deep breathing but denies any shortness of breath.   In the ED, temperature 98.5 F, HR 96, RR 16, BP 160/89, SpO2 96% on room air.  WBC 15.7, hemoglobin 8.7, platelets 344.  Sodium 134, potassium 4.4, chloride 106, CO2 16, glucose 165, BUN 37, creatinine 2.68.  Lactic acid 0.9.  Urinalysis with negative leukocytes, negative  nitrite, 0-5 WBCs.  CT head/C-spine without contrast with stable small hemorrhage/contusion right frontal lobe with no midline shift or mass effect, chronic microvascular ischemic changes, medial and inferior orbital wall fractures on the left which is unchanged, no evidence of acute fracture C-spine.  Right elbow/forearm/hand with no acute fracture within the right elbow, forearm or hand with moderate elbow osteoarthritis.  Vascular duplex right upper extremity ultrasound negative for DVT.  Chest x-ray with no acute infiltrates.  Hospital course:  Acute gouty arthritis Patient reports increased pain with decreased range of motion to right elbow and right shoulder which is similar to previous acute gout flares.  Recently finished 7-day course of prednisone without tapering. Allopurinol increase to 150 mg p.o. twice daily.  Patient was started on prednisone 50 mg p.o. daily with improvement of symptoms and will continue prednisone taper on discharge.  Outpatient follow-up with PCP.  Low purine diet.  Recommend repeat uric acid level in 1-2 weeks.   Noncardiac chest pain: Resolved Etiology likely costochondritis.  Nuclear medicine VQ scan negative for pulmonary embolism.   Hypokalemia Repleted during hospitalization.   Recent subarachnoid hemorrhage Right frontal lobe hemorrhagic contusion Repeat CT head on this admission shows area of concern stable.  Etiology from previous fall from reported hypoglycemic episode.  Denies any symptoms. Continue nimodipine 60 mg every 4 hours   Left eyebrow/lid laceration Repaired previously in the ED on 11/15 with 5 simple interrupted sutures.  Outpatient follow-up with PCP versus return to the ED for suture removal.   Left orbital fracture Evaluated by ENT, Dr. Janace Hoard on 11/15 who recommended outpatient follow-up with ENT 1 week.   Hyponatremia Sodium 134 on admission, likely secondary to dehydration.  Improved to 136 with IV fluids.  Now resolved.    Essential hypertension Continue metoprolol tartrate 25 mg p.o. twice daily, Amlodipine 5 mg p.o. daily   Type 2 diabetes mellitus Hemoglobin A1c 6.7 on 01/04/2022, well-controlled.  Home medications include Lantus, NovoLog, Ozempic, Jardiance 25 mg p.o. daily.    Hyperlipidemia Simvastatin 10 mg p.o. daily   Morbidly obese Body mass index is 40.02 kg/m.  Discussed with patient needs for aggressive lifestyle changes/weight loss as this complicates all facets of care.  Outpatient follow-up with PCP.  Discharge Diagnoses:  Principal Problem:   Right arm cellulitis    Discharge Instructions  Discharge Instructions     Call MD for:  difficulty breathing, headache or visual disturbances   Complete by: As directed    Call MD for:  extreme fatigue   Complete by: As directed    Call MD for:  persistant dizziness or light-headedness   Complete by: As directed    Call MD for:  persistant nausea and vomiting   Complete by: As directed    Call MD for:  severe uncontrolled pain   Complete by: As directed    Call MD for:  temperature >100.4   Complete by: As directed    Diet - low sodium heart healthy   Complete by: As directed    Increase activity slowly   Complete by: As directed       Allergies as of 01/11/2022   No Known Allergies      Medication List     STOP taking these medications    acetaminophen 500 MG tablet Commonly known as: TYLENOL   furosemide 20 MG tablet Commonly known as: Lasix   traZODone 100 MG tablet Commonly known as: DESYREL   vitamin C 1000 MG tablet       TAKE these medications    allopurinol 300 MG tablet Commonly known as: ZYLOPRIM Take 0.5 tablets (150 mg total) by mouth 2 (two) times daily. What changed:  medication strength how much to take Another medication with the same name was removed. Continue taking this medication, and follow the directions you see here.   amLODipine 5 MG tablet Commonly known as: NORVASC Take 5 mg  by mouth daily.   BD Pen Needle Nano U/F 32G X 4 MM Misc Generic drug: Insulin Pen Needle Use as directed   Insulin Pen Needle 32G X 4 MM Misc Use as directed   blood glucose meter kit and supplies Kit Dispense based on patient and insurance preference. Use up to four times daily as directed.   colchicine 0.6 MG tablet Take 2 tablets (1.2 mg) during a gout flareup, then take 1 tablet an hour later. What changed:  how much to take how to take this when to take this additional instructions   gabapentin 300 MG capsule Commonly known as: NEURONTIN Take 1 capsule (300 mg total) by mouth 3 (three) times daily. What changed:  when to take this reasons to take this   Jardiance 25 MG Tabs tablet Generic drug: empagliflozin Take 25 mg by mouth daily.   Lantus SoloStar 100 UNIT/ML Solostar Pen Generic drug: insulin glargine Inject 25 Units into the skin 2 (two) times daily. What changed: how much to take   metoprolol tartrate 25 MG tablet Commonly known as: LOPRESSOR Take 0.5 tablets (12.5 mg total) by mouth 2 (two) times daily. What changed:  how much to take when to take this   niMODipine 30 MG capsule Commonly known as: NIMOTOP  Take 2 capsules (60 mg total) by mouth every 4 (four) hours for 20 days.   NovoLOG FlexPen 100 UNIT/ML FlexPen Generic drug: insulin aspart Inject 4 Units into the skin 3 (three) times daily with meals. What changed: when to take this   oxyCODONE 5 MG immediate release tablet Commonly known as: Roxicodone Take 1 tablet (5 mg total) by mouth every 8 (eight) hours as needed for moderate pain.   Ozempic (1 MG/DOSE) 4 MG/3ML Sopn Generic drug: Semaglutide (1 MG/DOSE) Inject 1 mg into the skin once a week.   predniSONE 10 MG tablet Commonly known as: DELTASONE Take 5 tablets (50 mg total) by mouth daily for 5 days, THEN 4 tablets (40 mg total) daily for 3 days, THEN 3 tablets (30 mg total) daily for 3 days, THEN 2 tablets (20 mg total) daily  for 3 days, THEN 1 tablet (10 mg total) daily for 3 days. Start taking on: January 12, 2022 What changed:  medication strength See the new instructions.   simvastatin 10 MG tablet Commonly known as: ZOCOR Take 10 mg by mouth at bedtime.   tiZANidine 4 MG tablet Commonly known as: ZANAFLEX Take 1 tablet (4 mg total) by mouth 2 (two) times daily as needed for muscle spasms.        Follow-up Information     [facility], [facility]. Schedule an appointment as soon as possible for a visit.   Specialty: Internal Medicine Why: need suture removal Contact information: [address] [place] [id number] [phone]                No Known Allergies  Consultations: None   Procedures/Studies: NM Pulmonary Perfusion  Result Date: 01/10/2022 CLINICAL DATA:  [place], chest pain, pain with inspiration EXAM: NUCLEAR MEDICINE PERFUSION LUNG SCAN TECHNIQUE: Perfusion images were obtained in multiple projections after intravenous injection of radiopharmaceutical. Ventilation scans intentionally deferred if perfusion scan and chest x-ray adequate for interpretation during COVID 19 epidemic. RADIOPHARMACEUTICALS:  4.3 mCi Tc-69mMAA IV COMPARISON:  Chest x-ray 01/10/2022 FINDINGS: Planar imaging of the chest was obtained after the intravenous administration of radiotracer. There are no perfusion defects identified. Normal radiotracer distribution within the bilateral lungs. IMPRESSION: 1. No evidence of pulmonary embolus. Electronically Signed   By: MRanda NgoM.D.   On: 01/10/2022 10:04   DG CHEST PORT 1 VIEW  Result Date: 01/10/2022 CLINICAL DATA:  Pre VQ Scan EXAM: PORTABLE CHEST 1 VIEW COMPARISON:  CXR 01/08/22 FINDINGS: Cardiac and mediastinal contours are unchanged. No focal airspace opacity. No displaced rib fractures. Visualized upper abdomen is unremarkable. The visualized skeletal structures are unremarkable. IMPRESSION: No focal airspace opacity. Electronically Signed   By:  HMarin RobertsM.D.   On: 01/10/2022 08:49   CT Head Wo Contrast  Result Date: 01/09/2022 CLINICAL DATA:  Fall, head and neck trauma. EXAM: CT HEAD WITHOUT CONTRAST CT CERVICAL SPINE WITHOUT CONTRAST TECHNIQUE: Multidetector CT imaging of the head and cervical spine was performed following the standard protocol without intravenous contrast. Multiplanar CT image reconstructions of the cervical spine were also generated. RADIATION DOSE REDUCTION: This exam was performed according to the departmental dose-optimization program which includes automated exposure control, adjustment of the mA and/or kV according to patient size and/or use of iterative reconstruction technique. COMPARISON:  01/03/2022. FINDINGS: CT HEAD FINDINGS Brain: A tiny 5 mm hyperdense focus is noted in the frontal lobe on the right, not significantly changed from the prior exam. No midline shift or mass effect. Periventricular white matter  hypodensities are present bilaterally. No hydrocephalus. Vascular: No hyperdense vessel or unexpected calcification. Skull: Normal. Negative for fracture or focal lesion. Sinuses/Orbits: Mild mucosal thickening in the ethmoid air cells and left maxillary sinus. Soft tissue swelling is noted over the left orbit and frontal bone. Fractures of the medial orbital wall and inferior orbital wall on the left are unchanged. Other: None. CT CERVICAL SPINE FINDINGS Alignment: Normal. Skull base and vertebrae: No acute fracture. No primary bone lesion or focal pathologic process. Soft tissues and spinal canal: No prevertebral fluid or swelling. No visible canal hematoma. Disc levels: Intervertebral disc space narrowing and degenerative endplate changes are present at C5-C6 and C6-C7. Upper chest: No acute abnormality. Other: Carotid artery calcifications. IMPRESSION: 1. Essentially stable small hemorrhage/contusion in the frontal lobe on the right. No midline shift or mass effect. 2. Chronic microvascular ischemic  changes. 3. Medial and inferior orbital wall fractures on the left, unchanged. 4. Mild degenerative changes in the cervical spine without evidence of acute fracture. Electronically Signed   By: Brett Fairy M.D.   On: 01/09/2022 01:05   CT Cervical Spine Wo Contrast  Result Date: 01/09/2022 CLINICAL DATA:  Fall, head and neck trauma. EXAM: CT HEAD WITHOUT CONTRAST CT CERVICAL SPINE WITHOUT CONTRAST TECHNIQUE: Multidetector CT imaging of the head and cervical spine was performed following the standard protocol without intravenous contrast. Multiplanar CT image reconstructions of the cervical spine were also generated. RADIATION DOSE REDUCTION: This exam was performed according to the departmental dose-optimization program which includes automated exposure control, adjustment of the mA and/or kV according to patient size and/or use of iterative reconstruction technique. COMPARISON:  01/03/2022. FINDINGS: CT HEAD FINDINGS Brain: A tiny 5 mm hyperdense focus is noted in the frontal lobe on the right, not significantly changed from the prior exam. No midline shift or mass effect. Periventricular white matter hypodensities are present bilaterally. No hydrocephalus. Vascular: No hyperdense vessel or unexpected calcification. Skull: Normal. Negative for fracture or focal lesion. Sinuses/Orbits: Mild mucosal thickening in the ethmoid air cells and left maxillary sinus. Soft tissue swelling is noted over the left orbit and frontal bone. Fractures of the medial orbital wall and inferior orbital wall on the left are unchanged. Other: None. CT CERVICAL SPINE FINDINGS Alignment: Normal. Skull base and vertebrae: No acute fracture. No primary bone lesion or focal pathologic process. Soft tissues and spinal canal: No prevertebral fluid or swelling. No visible canal hematoma. Disc levels: Intervertebral disc space narrowing and degenerative endplate changes are present at C5-C6 and C6-C7. Upper chest: No acute abnormality.  Other: Carotid artery calcifications. IMPRESSION: 1. Essentially stable small hemorrhage/contusion in the frontal lobe on the right. No midline shift or mass effect. 2. Chronic microvascular ischemic changes. 3. Medial and inferior orbital wall fractures on the left, unchanged. 4. Mild degenerative changes in the cervical spine without evidence of acute fracture. Electronically Signed   By: Brett Fairy M.D.   On: 01/09/2022 01:05   US Venous Img Upper Uni Right(DVT)  Result Date: 01/08/2022 CLINICAL DATA:  Right arm swelling EXAM: RIGHT UPPER EXTREMITY VENOUS DOPPLER ULTRASOUND TECHNIQUE: Gray-scale sonography with graded compression, as well as color Doppler and duplex ultrasound were performed to evaluate the upper extremity deep venous system from the level of the subclavian vein and including the jugular, axillary, basilic, radial, ulnar and upper cephalic vein. Spectral Doppler was utilized to evaluate flow at rest and with distal augmentation maneuvers. COMPARISON:  None Available. FINDINGS: Contralateral Subclavian Vein: Respiratory phasicity is normal and symmetric  with the symptomatic side. No evidence of thrombus. Normal compressibility. Internal Jugular Vein: No evidence of thrombus. Normal compressibility, respiratory phasicity and response to augmentation. Subclavian Vein: No evidence of thrombus. Normal compressibility, respiratory phasicity and response to augmentation. Axillary Vein: No evidence of thrombus. Normal compressibility, respiratory phasicity and response to augmentation. Cephalic Vein: No evidence of thrombus. Normal compressibility, respiratory phasicity and response to augmentation. Basilic Vein: No evidence of thrombus. Normal compressibility, respiratory phasicity and response to augmentation. Brachial Veins: No evidence of thrombus. Normal compressibility, respiratory phasicity and response to augmentation. Radial Veins: No evidence of thrombus. Normal compressibility,  respiratory phasicity and response to augmentation. Ulnar Veins: No evidence of thrombus. Normal compressibility, respiratory phasicity and response to augmentation. Venous Reflux:  None visualized. Other Findings:  None visualized. IMPRESSION: No evidence of DVT within the right upper extremity. Electronically Signed   By: Rolm Baptise M.D.   On: 01/08/2022 23:01   DG Forearm Right  Result Date: 01/08/2022 CLINICAL DATA:  Fall.  Pain. EXAM: RIGHT FOREARM - 2 VIEW; RIGHT ELBOW - COMPLETE 3+ VIEW; RIGHT HAND - COMPLETE 3+ VIEW COMPARISON:  None Available. FINDINGS: Right elbow: Moderate peripheral medial elbow coronoid process greater than trochlear degenerative spurring. Moderate degenerative spurring at the tip of the coronoid process at the volar elbow. 5 mm well corticated chronic ossicle just distal to the tip of the coronoid process on lateral view. Mild to moderate radiocapitellar joint space narrowing. Visualization of the posterior distal humeral fat pad suggesting an elbow joint effusion. Mild chronic enthesopathic spurring at the triceps insertion on the olecranon. Mild chronic enthesopathic change at the common extensor tendon origin at the lateral epicondyle. No acute fracture is seen.  No dislocation. -- Right forearm: No acute fracture is seen within the more distal aspect of the radius or ulna. -- Right hand: Mild triscaphe and thumb carpometacarpal joint space narrowing. Mild second through fifth DIP joint space narrowing. Mild flexion of the fifth finger DIP joint on the provided images. No acute fracture is seen. No dislocation. Mild vascular calcifications. IMPRESSION: 1. No acute fracture is seen within the right elbow, forearm, or hand. 2. Moderate elbow osteoarthritis. 3. Mild flexion of the fifth finger DIP joint on the provided images. Correlate for extensor tendon injury. 4. Mild triscaphe and thumb carpometacarpal osteoarthritis. Electronically Signed   By: Yvonne Kendall M.D.   On:  01/08/2022 21:57   DG Elbow Complete Right  Result Date: 01/08/2022 CLINICAL DATA:  Fall.  Pain. EXAM: RIGHT FOREARM - 2 VIEW; RIGHT ELBOW - COMPLETE 3+ VIEW; RIGHT HAND - COMPLETE 3+ VIEW COMPARISON:  None Available. FINDINGS: Right elbow: Moderate peripheral medial elbow coronoid process greater than trochlear degenerative spurring. Moderate degenerative spurring at the tip of the coronoid process at the volar elbow. 5 mm well corticated chronic ossicle just distal to the tip of the coronoid process on lateral view. Mild to moderate radiocapitellar joint space narrowing. Visualization of the posterior distal humeral fat pad suggesting an elbow joint effusion. Mild chronic enthesopathic spurring at the triceps insertion on the olecranon. Mild chronic enthesopathic change at the common extensor tendon origin at the lateral epicondyle. No acute fracture is seen.  No dislocation. -- Right forearm: No acute fracture is seen within the more distal aspect of the radius or ulna. -- Right hand: Mild triscaphe and thumb carpometacarpal joint space narrowing. Mild second through fifth DIP joint space narrowing. Mild flexion of the fifth finger DIP joint on the provided images. No acute fracture  is seen. No dislocation. Mild vascular calcifications. IMPRESSION: 1. No acute fracture is seen within the right elbow, forearm, or hand. 2. Moderate elbow osteoarthritis. 3. Mild flexion of the fifth finger DIP joint on the provided images. Correlate for extensor tendon injury. 4. Mild triscaphe and thumb carpometacarpal osteoarthritis. Electronically Signed   By: Yvonne Kendall M.D.   On: 01/08/2022 21:57   DG Hand Complete Right  Result Date: 01/08/2022 CLINICAL DATA:  Fall.  Pain. EXAM: RIGHT FOREARM - 2 VIEW; RIGHT ELBOW - COMPLETE 3+ VIEW; RIGHT HAND - COMPLETE 3+ VIEW COMPARISON:  None Available. FINDINGS: Right elbow: Moderate peripheral medial elbow coronoid process greater than trochlear degenerative spurring.  Moderate degenerative spurring at the tip of the coronoid process at the volar elbow. 5 mm well corticated chronic ossicle just distal to the tip of the coronoid process on lateral view. Mild to moderate radiocapitellar joint space narrowing. Visualization of the posterior distal humeral fat pad suggesting an elbow joint effusion. Mild chronic enthesopathic spurring at the triceps insertion on the olecranon. Mild chronic enthesopathic change at the common extensor tendon origin at the lateral epicondyle. No acute fracture is seen.  No dislocation. -- Right forearm: No acute fracture is seen within the more distal aspect of the radius or ulna. -- Right hand: Mild triscaphe and thumb carpometacarpal joint space narrowing. Mild second through fifth DIP joint space narrowing. Mild flexion of the fifth finger DIP joint on the provided images. No acute fracture is seen. No dislocation. Mild vascular calcifications. IMPRESSION: 1. No acute fracture is seen within the right elbow, forearm, or hand. 2. Moderate elbow osteoarthritis. 3. Mild flexion of the fifth finger DIP joint on the provided images. Correlate for extensor tendon injury. 4. Mild triscaphe and thumb carpometacarpal osteoarthritis. Electronically Signed   By: Yvonne Kendall M.D.   On: 01/08/2022 21:57   DG Chest Port 1 View  Result Date: 01/08/2022 CLINICAL DATA:  Status post fall with bilateral rib pain. EXAM: PORTABLE CHEST 1 VIEW COMPARISON:  September 22, 2020 FINDINGS: The heart size and mediastinal contours are within normal limits. Both lungs are clear. No acute osseous abnormalities are identified. Marked severity degenerative changes are seen involving the left shoulder. Degenerative changes are seen within the mid and lower thoracic spine. IMPRESSION: No active cardiopulmonary disease. Electronically Signed   By: Virgina Norfolk M.D.   On: 01/08/2022 21:53   US RENAL  Result Date: 01/03/2022 CLINICAL DATA:  Acute kidney injury EXAM: RENAL /  URINARY TRACT ULTRASOUND COMPLETE COMPARISON:  CT renal stone dated 09/22/2020 FINDINGS: Right Kidney: Length = 10.7 cm AP renal pelvis diameter = <10 mm Mildly increased cortical echogenicity. No urinary tract dilation or shadowing calculi. The ureter is not seen. Left Kidney: Length = 9.6 cm AP renal pelvis diameter = <10 mm Normal parenchymal echogenicity with preserved corticomedullary differentiation. No urinary tract dilation or shadowing calculi. The ureter is not seen. Bladder: Appears normal for degree of bladder distention. Other: Technically challenging examination due to patient body habitus. IMPRESSION: 1. No urinary tract dilation or shadowing calculi. 2. Mildly increased right renal cortical echogenicity can be seen in the setting of medical renal disease. Electronically Signed   By: Darrin Nipper M.D.   On: 01/03/2022 11:41   CT Head Wo Contrast  Result Date: 01/03/2022 CLINICAL DATA:  Head trauma, moderate-severe.  Fall. EXAM: CT HEAD WITHOUT CONTRAST TECHNIQUE: Contiguous axial images were obtained from the base of the skull through the vertex without intravenous contrast. RADIATION  DOSE REDUCTION: This exam was performed according to the departmental dose-optimization program which includes automated exposure control, adjustment of the mA and/or kV according to patient size and/or use of iterative reconstruction technique. COMPARISON:  Head CT and MRI 11/08/2021 FINDINGS: Brain: A 5 mm focus of acute hemorrhage superficially in the anterolateral right frontal lobe is compatible with a small hemorrhagic contusion with minimal surrounding edema. There is a small amount of subarachnoid hemorrhage in the left sylvian fissure. No acute infarct, midline shift, or extra-axial fluid collection is identified. The ventricles are normal in size. Patchy hypodensities in the cerebral white matter bilaterally are unchanged and nonspecific but compatible with moderately age advanced chronic small vessel  ischemic disease. Vascular: No definite hyperdense vessel within limitations of acute hemorrhage. Skull: Left orbital floor fracture with up to 5 mm of displacement. Left lamina papyracea fracture without significant depression. Sinuses/Orbits: Left orbital fractures as described above. Mild left orbital emphysema. Moderately large left periorbital hematoma. No retrobulbar hematoma. Small volume fluid/blood and mucosal thickening in the inferior left frontal, left ethmoid, and left maxillary sinuses. Clear mastoid air cells. Other: None. Critical Value/emergent results were called by telephone at the time of interpretation on 01/03/2022 at 8:42 am to Dr. Marye Round, who verbally acknowledged these results. IMPRESSION: 1. Small hemorrhagic contusion in the right frontal lobe. 2. Small amount of subarachnoid hemorrhage in the left sylvian fissure. 3. Fractures of the floor and medial wall of the left orbit. 4. Chronic small vessel ischemic disease. Electronically Signed   By: Logan Bores M.D.   On: 01/03/2022 08:53   CT Cervical Spine Wo Contrast  Result Date: 01/03/2022 CLINICAL DATA:  Neck trauma, intoxicated or obtunded (Age >= 16y) EXAM: CT CERVICAL SPINE WITHOUT CONTRAST TECHNIQUE: Multidetector CT imaging of the cervical spine was performed without intravenous contrast. Multiplanar CT image reconstructions were also generated. RADIATION DOSE REDUCTION: This exam was performed according to the departmental dose-optimization program which includes automated exposure control, adjustment of the mA and/or kV according to patient size and/or use of iterative reconstruction technique. COMPARISON:  None Available. FINDINGS: Alignment: Physiologic. Skull base and vertebrae: There is no evidence of acute cervical spine fracture. Degenerative endplate changes worst at C5-C6. No aggressive osseous lesion. There is nonspecific osteosclerosis of the posterior elements and posterior aspects of the vertebral bodies,  present throughout the spine as seen on multiple prior CTs dating back to September 1610, likely metabolic. Soft tissues and spinal canal: No prevertebral fluid or swelling. No visible canal hematoma. Disc levels: There is moderate degenerative disc disease C5-C6. Posterior disc osteophyte complex at C5-C6 and C6-C7 results in no significant stenosis. Upper chest: Negative. Other: Subcentimeter hypodense right thyroid nodule requires no follow-up imaging. IMPRESSION: No evidence of acute cervical spine fracture. Electronically Signed   By: Maurine Simmering M.D.   On: 01/03/2022 08:45   CT ELBOW LEFT WO CONTRAST  Result Date: 12/29/2021 CLINICAL DATA:  Chronic elbow pain.  Possible gout. EXAM: CT OF THE UPPER LEFT EXTREMITY WITHOUT CONTRAST TECHNIQUE: Multidetector CT imaging of the upper left extremity was performed according to the standard protocol. RADIATION DOSE REDUCTION: This exam was performed according to the departmental dose-optimization program which includes automated exposure control, adjustment of the mA and/or kV according to patient size and/or use of iterative reconstruction technique. COMPARISON:  Radiographs 12/29/2021 FINDINGS: Due to pain, the patient was unable to position his arm above his head, and the elbow had to be scanned adjacent to his chest and abdomen. This not  only introduces motion, but part of the elbow was excluded from the region which could be imaged. This portion encompasses the lateral condyle, epicondyle, and part of the radial head. This occurred despite patient repositioning and could not be eliminated. As result, the elbow was not imaged in its entirety. Bones/Joint/Cartilage Spurring of the olecranon at the triceps attachment site and at the articular margin. Spurring of the coronoid process. There is lucency and irregularity along the posterolateral radial head on images 86 through 93 of series 9, potentially from radial head fracture or spurring. Lower thoracic  spondylosis. Ligaments N/A Muscles and Tendons Grossly unremarkable Soft tissues Soft tissue swelling posterior to the olecranon, query olecranon bursitis. Differentiation of bursal fluid from infiltrative edema could be accomplished with focused sonography. I am skeptical of an elbow joint effusion although this is not certain. Other imaging findings of potential clinical significance: Coronary atherosclerosis. IMPRESSION: 1. Due to pain, the patient was unable to position his arm above his head, and the elbow had to be scanned adjacent to his chest and abdomen. Because of this and body habitus, part of the elbow could not be included in the addressable volume, and was excluded. Imaging planes are highly suboptimal as well, with reduced signal to noise ratio due to inclusion of the chest and abdomen. 2. Lucency and irregularity along the posterolateral radial head, potentially from radial head fracture or spurring. 3. Soft tissue swelling posterior to the olecranon, query olecranon bursitis. Differentiation of bursal fluid from infiltrative edema could be accomplished with focused sonography overlying the olecranon. 4. Coronary atherosclerosis. 5. Lower thoracic spondylosis. Electronically Signed   By: Van Clines M.D.   On: 12/29/2021 10:54   DG Elbow 2 Views Left  Result Date: 12/29/2021 CLINICAL DATA:  Left wrist and elbow pain EXAM: LEFT ELBOW - 2 VIEW COMPARISON:  None Available. FINDINGS: Best possible images due to limited range of motion. Limited projections compromises sensitivity. Noting this there is no definite fracture. No dislocation. Soft tissue swelling about the elbow. IMPRESSION: No definite fracture.  Soft tissue swelling about the elbow. Electronically Signed   By: Placido Sou M.D.   On: 12/29/2021 01:30     Subjective: Patient seen examined bedside, resting calmly.  Lying in bed.  No complaints this morning.  Pain to right shoulder and elbow much improved.  Discussed will  continue prednisone with slow taper on discharge.  Also instructed to increase his allopurinol to 150 mg p.o. twice daily.  No other questions or concerns at this time.  Denies headache, no dizziness, no chest pain, no palpitations, no shortness of breath, no fever/chills/night sweats, no nausea/vomiting/diarrhea, no focal weakness, no fatigue, no paresthesias.  No acute events overnight per nursing staff.  Discharge Exam: Vitals:   01/10/22 2056 01/11/22 0900  BP: (!) 158/81 (!) 151/89  Pulse: 91 76  Resp: 18 18  Temp: 98.2 F (36.8 C) 98.3 F (36.8 C)  SpO2: 98% 100%   Vitals:   01/10/22 1027 01/10/22 1626 01/10/22 2056 01/11/22 0900  BP: (!) 148/68 (!) 147/81 (!) 158/81 (!) 151/89  Pulse: 70 72 91 76  Resp:  _0 Temp: 98.6 F (37 C) 98 F (36.7 C) 98.2 F (36.8 C) 98.3 F (36.8 C)  TempSrc: Oral Oral Oral Oral  SpO2: 98% 99% 98% 100%  Weight:      Height:        Physical Exam: GEN: NAD, alert and oriented x 3, obese HEENT: NCAT, PERRL, EOMI, sclera  clear, MMM, noted left eyelid/eyebrow laceration well-approximated with sutures in place PULM: CTAB w/o wheezes/crackles, normal respiratory effort, on room air CV: RRR w/o M/G/R GI: abd soft, NTND, NABS, no R/G/M MSK: no peripheral edema, noted bilateral BKA, moves all extremities independently NEURO: CN II-XII intact, no focal deficits, sensation to light touch intact PSYCH: normal mood/affect Integumentary: dry/intact, no rashes or wounds    The results of significant diagnostics from this hospitalization (including imaging, microbiology, ancillary and laboratory) are listed below for reference.     Microbiology: Recent Results (from the past 240 hour(s))  Blood Culture (routine x 2)     Status: None (Preliminary result)   Collection Time: 01/08/22  8:55 PM   Specimen: BLOOD  Result Value Ref Range Status   Specimen Description   Final    BLOOD Performed at [facility], [address] [place], [place] [address]    Special Requests   Final    NONE Performed at [facility], [address], [place], [place] [address]    Culture   Final    NO GROWTH < 24 HOURS Performed at [facility], [place] [address], [place], [place] [address]    Report Status PENDING  Incomplete  Urine Culture     Status: None   Collection Time: 01/08/22 11:01 PM   Specimen: In/Out Cath Urine  Result Value Ref Range Status   Specimen Description   Final    IN/OUT CATH URINE Performed at [facility], [address], [place], [place] [address]    Special Requests   Final    NONE Performed at [facility], [address], [place], [place] [address]    Culture   Final    NO GROWTH Performed at [facility], [place] [address], [place], [place] [address]    Report Status 01/10/2022 FINAL  Final  Resp Panel by RT-PCR (Flu A&B, Covid) Anterior Nasal Swab     Status: None   Collection Time: 01/08/22 11:01 PM   Specimen: Anterior Nasal Swab  Result Value Ref Range Status   SARS Coronavirus 2 by RT PCR NEGATIVE NEGATIVE Final    Comment: (NOTE) SARS-CoV-2 target nucleic acids are NOT DETECTED.  The SARS-CoV-2 RNA is generally detectable in upper respiratory specimens during the acute phase of infection. The lowest concentration of SARS-CoV-2 viral copies this assay can detect is 138 copies/mL. A negative result does not preclude SARS-Cov-2 infection and should not be used as the sole basis for treatment or other patient management decisions. A negative result may occur with  improper specimen collection/handling, submission of specimen other than nasopharyngeal swab, presence of viral mutation(s) within the areas targeted by this assay, and inadequate number of viral copies(<138 copies/mL). A negative result must be combined with clinical observations, patient history, and epidemiological information. The expected result  is Negative.  Fact Sheet for Patients:  EntrepreneurPulse.com.au  Fact Sheet for Healthcare Providers:  IncredibleEmployment.be  This test is no t yet approved or cleared by the [place] FDA and  has been authorized for detection and/or diagnosis of SARS-CoV-2 by FDA under an Emergency Use Authorization (EUA). This EUA will remain  in effect (meaning this test can be used) for the duration of the COVID-19 declaration under Section 564(b)(1) of the Act, 21 U.S.C.section 360bbb-3(b)(1), unless the authorization is terminated  or revoked sooner.       Influenza A by PCR NEGATIVE NEGATIVE Final   Influenza B by PCR  NEGATIVE NEGATIVE Final    Comment: (NOTE) The Xpert Xpress SARS-CoV-2/FLU/RSV plus assay is intended as an aid in the diagnosis of influenza from Nasopharyngeal swab specimens and should not be used as a sole basis for treatment. Nasal washings and aspirates are unacceptable for Xpert Xpress SARS-CoV-2/FLU/RSV testing.  Fact Sheet for Patients: EntrepreneurPulse.com.au  Fact Sheet for Healthcare Providers: IncredibleEmployment.be  This test is not yet approved or cleared by the [place] FDA and has been authorized for detection and/or diagnosis of SARS-CoV-2 by FDA under an Emergency Use Authorization (EUA). This EUA will remain in effect (meaning this test can be used) for the duration of the COVID-19 declaration under Section 564(b)(1) of the Act, 21 U.S.C. section 360bbb-3(b)(1), unless the authorization is terminated or revoked.  Performed at [facility], [address] [place], [place] [address]      Labs: BNP (last 3 results) No results for input(s): "BNP" in the last 8760 hours. Basic Metabolic Panel: Recent Labs  Lab 01/08/22 2055 01/10/22 0407  NA 134* 136  K 4.4 4.6  CL 106 108  CO2 16* 14*  GLUCOSE 165* 188*  BUN 37* 35*  CREATININE 2.68*  3.01*  CALCIUM 7.3* 7.4*   Liver Function Tests: Recent Labs  Lab 01/08/22 2055  AST 26  ALT 22  ALKPHOS 85  BILITOT 0.9  PROT 7.1  ALBUMIN 3.4*   No results for input(s): "LIPASE", "AMYLASE" in the last 168 hours. No results for input(s): "AMMONIA" in the last 168 hours. CBC: Recent Labs  Lab 01/08/22 2055 01/10/22 0407  WBC 15.7* 16.1*  NEUTROABS 12.3*  --   HGB 8.7* 7.9*  HCT 27.4* 25.5*  MCV 85.9 87.0  PLT 344 344   Cardiac Enzymes: Recent Labs  Lab 01/09/22 1621  CKTOTAL 461*   BNP: Invalid input(s): "POCBNP" CBG: Recent Labs  Lab 01/10/22 1111 01/10/22 1706 01/10/22 2046 01/10/22 2320 01/11/22 0521  GLUCAP 160* 194* 363* 303* 157*   D-Dimer Recent Labs    01/08/22 2150  DDIMER 4.79*   Hgb A1c No results for input(s): "HGBA1C" in the last 72 hours. Lipid Profile No results for input(s): "CHOL", "HDL", "LDLCALC", "TRIG", "CHOLHDL", "LDLDIRECT" in the last 72 hours. Thyroid function studies No results for input(s): "TSH", "T4TOTAL", "T3FREE", "THYROIDAB" in the last 72 hours.  Invalid input(s): "FREET3" Anemia work up No results for input(s): "VITAMINB12", "FOLATE", "FERRITIN", "TIBC", "IRON", "RETICCTPCT" in the last 72 hours. Urinalysis    Component Value Date/Time   COLORURINE YELLOW 01/08/2022 2301   APPEARANCEUR CLEAR 01/08/2022 2301   LABSPEC 1.017 01/08/2022 2301   PHURINE 6.0 01/08/2022 2301   GLUCOSEU NEGATIVE 01/08/2022 2301   HGBUR LARGE (A) 01/08/2022 2301   HGBUR negative 01/27/2010 0934   BILIRUBINUR NEGATIVE 01/08/2022 2301   KETONESUR TRACE (A) 01/08/2022 2301   PROTEINUR 100 (A) 01/08/2022 2301   UROBILINOGEN 0.2 10/26/2013 1130   NITRITE NEGATIVE 01/08/2022 2301   LEUKOCYTESUR NEGATIVE 01/08/2022 2301   Sepsis Labs Recent Labs  Lab 01/08/22 2055 01/10/22 0407  WBC 15.7* 16.1*   Microbiology Recent Results (from the past 240 hour(s))  Blood Culture (routine x 2)     Status: None (Preliminary result)    Collection Time: 01/08/22  8:55 PM   Specimen: BLOOD  Result Value Ref Range Status   Specimen Description   Final    BLOOD Performed at [facility], [address] [place], [place] [address]    Special Requests   Final    NONE  Performed at [facility], [address] [place], [place] [address]    Culture   Final    NO GROWTH < 24 HOURS Performed at [facility], [place] [address] [place], [place] [address]    Report Status PENDING  Incomplete  Urine Culture     Status: None   Collection Time: 01/08/22 11:01 PM   Specimen: In/Out Cath Urine  Result Value Ref Range Status   Specimen Description   Final    IN/OUT CATH URINE Performed at [facility], [address] [place], [place] [address]    Special Requests   Final    NONE Performed at [facility], [address] [place], [place] [address]    Culture   Final    NO GROWTH Performed at [facility], [place] [address], [place], [place] [address]    Report Status 01/10/2022 FINAL  Final  Resp Panel by RT-PCR (Flu A&B, Covid) Anterior Nasal Swab     Status: None   Collection Time: 01/08/22 11:01 PM   Specimen: Anterior Nasal Swab  Result Value Ref Range Status   SARS Coronavirus 2 by RT PCR NEGATIVE NEGATIVE Final    Comment: (NOTE) SARS-CoV-2 target nucleic acids are NOT DETECTED.  The SARS-CoV-2 RNA is generally detectable in upper respiratory specimens during the acute phase of infection. The lowest concentration of SARS-CoV-2 viral copies this assay can detect is 138 copies/mL. A negative result does not preclude SARS-Cov-2 infection and should not be used as the sole basis for treatment or other patient management decisions. A negative result may occur with  improper specimen collection/handling, submission of specimen other than nasopharyngeal swab, presence of viral mutation(s) within the areas targeted by this assay, and  inadequate number of viral copies(<138 copies/mL). A negative result must be combined with clinical observations, patient history, and epidemiological information. The expected result is Negative.  Fact Sheet for Patients:  EntrepreneurPulse.com.au  Fact Sheet for Healthcare Providers:  IncredibleEmployment.be  This test is no t yet approved or cleared by the [place] FDA and  has been authorized for detection and/or diagnosis of SARS-CoV-2 by FDA under an Emergency Use Authorization (EUA). This EUA will remain  in effect (meaning this test can be used) for the duration of the COVID-19 declaration under Section 564(b)(1) of the Act, 21 U.S.C.section 360bbb-3(b)(1), unless the authorization is terminated  or revoked sooner.       Influenza A by PCR NEGATIVE NEGATIVE Final   Influenza B by PCR NEGATIVE NEGATIVE Final    Comment: (NOTE) The Xpert Xpress SARS-CoV-2/FLU/RSV plus assay is intended as an aid in the diagnosis of influenza from Nasopharyngeal swab specimens and should not be used as a sole basis for treatment. Nasal washings and aspirates are unacceptable for Xpert Xpress SARS-CoV-2/FLU/RSV testing.  Fact Sheet for Patients: EntrepreneurPulse.com.au  Fact Sheet for Healthcare Providers: IncredibleEmployment.be  This test is not yet approved or cleared by the [place] FDA and has been authorized for detection and/or diagnosis of SARS-CoV-2 by FDA under an Emergency Use Authorization (EUA). This EUA will remain in effect (meaning this test can be used) for the duration of the COVID-19 declaration under Section 564(b)(1) of the Act, 21 U.S.C. section 360bbb-3(b)(1), unless the authorization is terminated or revoked.  Performed at [facility], [address] [place], [place] [address]      Time coordinating discharge: Over 30 minutes  SIGNED:   Oda Lansdowne J  [place] DO  [facility] 01/11/2022, 10:09 AM

## 2022-01-14 LAB — CULTURE, BLOOD (ROUTINE X 2): Culture: NO GROWTH

## 2022-01-15 ENCOUNTER — Other Ambulatory Visit: Payer: Self-pay

## 2022-01-15 ENCOUNTER — Emergency Department (HOSPITAL_BASED_OUTPATIENT_CLINIC_OR_DEPARTMENT_OTHER)
Admission: EM | Admit: 2022-01-15 | Discharge: 2022-01-15 | Disposition: A | Discharge status: Home / Self Care | Payer: BC Managed Care – PPO | Attending: Emergency Medicine | Admitting: Emergency Medicine

## 2022-01-15 DIAGNOSIS — Z79899 Other long term (current) drug therapy: Secondary | ICD-10-CM | POA: Insufficient documentation

## 2022-01-15 DIAGNOSIS — M5431 Sciatica, right side: Secondary | ICD-10-CM | POA: Diagnosis not present

## 2022-01-15 DIAGNOSIS — M549 Dorsalgia, unspecified: Secondary | ICD-10-CM | POA: Diagnosis not present

## 2022-01-15 DIAGNOSIS — Z4802 Encounter for removal of sutures: Secondary | ICD-10-CM | POA: Diagnosis not present

## 2022-01-15 DIAGNOSIS — I10 Essential (primary) hypertension: Secondary | ICD-10-CM | POA: Insufficient documentation

## 2022-01-15 DIAGNOSIS — S01112D Laceration without foreign body of left eyelid and periocular area, subsequent encounter: Secondary | ICD-10-CM | POA: Diagnosis not present

## 2022-01-15 DIAGNOSIS — Z794 Long term (current) use of insulin: Secondary | ICD-10-CM | POA: Diagnosis not present

## 2022-01-15 DIAGNOSIS — E119 Type 2 diabetes mellitus without complications: Secondary | ICD-10-CM | POA: Diagnosis not present

## 2022-01-15 DIAGNOSIS — M5441 Lumbago with sciatica, right side: Secondary | ICD-10-CM | POA: Diagnosis not present

## 2022-01-15 MED ORDER — LIDOCAINE 5 % EX PTCH
1.0000 | MEDICATED_PATCH | CUTANEOUS | Status: DC
  Administered 2022-01-15: 1 via TRANSDERMAL
  Filled 2022-01-15: qty 1

## 2022-01-15 MED ORDER — LIDOCAINE 5 % EX PTCH: 1.0000 | MEDICATED_PATCH | CUTANEOUS | 0 refills | Status: DC

## 2022-01-15 NOTE — ED Provider Notes (Signed)
[facility] Provider Note   CSN: [id number] Arrival date & time: 01/15/22  1811     History  Chief Complaint  Patient presents with   Suture / Staple Removal   Back Pain    Patrick [name] is a 56 y.o. male.   Suture / Staple Removal  Back Pain   56 year old male presents emergency department for suture removal.  Patient had 5 sutures placed over his left eye 7 days ago.  He is also complaining of right-sided sciatica type pain.  Denies any recent trauma.  States that prednisone taper discharge from the hospital for acute gout flareup has helped the pain but pain returned yesterday as taper was decreasing.  Denies fever, saddle anesthesia, bowel/bladder dysfunction, weakness/sensory deficits in lower extremities, history of IV drug use, known malignancy.  Past medical history significant for diabetes mellitus, hypertension, PVD, obesity  Home Medications Prior to Admission medications   Medication Sig Start Date End Date Taking? Authorizing Provider  lidocaine (LIDODERM) 5 % Place 1 patch onto the skin daily. Remove & Discard patch within 12 hours or as directed by MD 01/15/22  Yes Wilnette Kales, PA  allopurinol (ZYLOPRIM) 300 MG tablet Take 0.5 tablets (150 mg total) by mouth 2 (two) times daily. 01/11/22 04/11/22  [place] Donnamarie Poag, DO  amLODipine (NORVASC) 5 MG tablet Take 5 mg by mouth daily. Patient not taking: Reported on 01/10/2022 08/18/21   [provider]  blood glucose meter kit and supplies KIT Dispense based on patient and insurance preference. Use up to four times daily as directed. 01/05/22   Little Ishikawa, MD  colchicine 0.6 MG tablet Take 2 tablets (1.2 mg) during a gout flareup, then take 1 tablet an hour later. Patient taking differently: Take 0.6-1.2 mg by mouth See admin instructions. Take 2 tablets (1.2 mg) during a gout flareup, then take 1 tablet (0.6 mg) an hour later. 12/16/21   Veryl Speak, MD  gabapentin  (NEURONTIN) 300 MG capsule Take 1 capsule (300 mg total) by mouth 3 (three) times daily. Patient taking differently: Take 300 mg by mouth daily as needed (pain). 09/08/20   Angiulli, Lavon Paganini, PA-C  insulin aspart (NOVOLOG FLEXPEN) 100 UNIT/ML FlexPen Inject 4 Units into the skin 3 (three) times daily with meals. Patient taking differently: Inject 4 Units into the skin 2 (two) times daily. 09/08/20   Angiulli, Lavon Paganini, PA-C  insulin glargine (LANTUS SOLOSTAR) 100 UNIT/ML Solostar Pen Inject 25 Units into the skin 2 (two) times daily. Patient taking differently: Inject 15 Units into the skin 2 (two) times daily. 09/08/20   Angiulli, Lavon Paganini, PA-C  Insulin Pen Needle 32G X 4 MM MISC Use as directed 09/08/20   [name], Clide Deutscher, MD  Insulin Pen Needle 32G X 4 MM MISC Use as directed 09/09/20   Angiulli, Lavon Paganini, PA-C  JARDIANCE 25 MG TABS tablet Take 25 mg by mouth daily. Patient not taking: Reported on 01/10/2022 09/28/21   [provider]  metoprolol tartrate (LOPRESSOR) 25 MG tablet Take 0.5 tablets (12.5 mg total) by mouth 2 (two) times daily. Patient taking differently: Take 25 mg by mouth daily. 09/08/20   Angiulli, Lavon Paganini, PA-C  niMODipine (NIMOTOP) 30 MG capsule Take 2 capsules (60 mg total) by mouth every 4 (four) hours for 20 days. 01/05/22 01/25/22  Little Ishikawa, MD  oxyCODONE (ROXICODONE) 5 MG immediate release tablet Take 1 tablet (5 mg total) by mouth every 8 (eight) hours as needed  for moderate pain. 01/11/22 01/11/23  [place] [name], DO  OZEMPIC, 1 MG/DOSE, 4 MG/3ML SOPN Inject 1 mg into the skin once a week. 01/01/22   [provider]  predniSONE (DELTASONE) 10 MG tablet Take 5 tablets (50 mg total) by mouth daily for 5 days, THEN 4 tablets (40 mg total) daily for 3 days, THEN 3 tablets (30 mg total) daily for 3 days, THEN 2 tablets (20 mg total) daily for 3 days, THEN 1 tablet (10 mg total) daily for 3 days. 01/12/22 01/29/22  [place] Donnamarie Poag, DO  simvastatin  (ZOCOR) 10 MG tablet Take 10 mg by mouth at bedtime. 12/08/21   [provider]  tiZANidine (ZANAFLEX) 4 MG tablet Take 1 tablet (4 mg total) by mouth 2 (two) times daily as needed for muscle spasms. 09/29/20   Lavina Hamman, MD      Allergies    Patient has no known allergies.    Review of Systems   Review of Systems  Musculoskeletal:  Positive for back pain.  All other systems reviewed and are negative.   Physical Exam Updated Vital Signs BP (!) 160/106 (BP Location: Right Arm)   Pulse 98   Temp 98.8 F (37.1 C)   Resp 18   Ht _0  (1.88 m)   Wt (!) 141.4 kg   SpO2 100%   BMI 40.02 kg/m  Physical Exam Vitals and nursing note reviewed.  Constitutional:      General: He is not in acute distress.    Appearance: He is well-developed.  HENT:     Head: Normocephalic and atraumatic.  Eyes:     Conjunctiva/sclera: Conjunctivae normal.  Cardiovascular:     Rate and Rhythm: Normal rate and regular rhythm.     Heart sounds: No murmur heard. Pulmonary:     Effort: Pulmonary effort is normal. No respiratory distress.     Breath sounds: Normal breath sounds.  Abdominal:     Palpations: Abdomen is soft.     Tenderness: There is no abdominal tenderness.  Musculoskeletal:        General: No swelling.     Cervical back: Neck supple.     Comments: Bilateral lower extremity amputation.  No midline tenderness to cervical, thoracic, lumbar spine with no evidence of overt foreign area.  No paraspinal tenderness in the lumbar region.  Tender to palpation in right gluteal region.  Straight leg raise was positive on the right side.  Strength symmetric bilateral lower extremities.  Sensation intact along major nerve distributions of the lower extremities.  Skin:    General: Skin is warm and dry.     Capillary Refill: Capillary refill takes less than 2 seconds.  Neurological:     Mental Status: He is alert.  Psychiatric:        Mood and Affect: Mood normal.     ED Results /  Procedures / Treatments   Labs (all labs ordered are listed, but only abnormal results are displayed) Labs Reviewed - No data to display  EKG None  Radiology No results found.  Procedures .Suture Removal  Date/Time: 01/15/2022 8:50 PM  Performed by: Wilnette Kales, PA Authorized by: Wilnette Kales, PA   Consent:    Consent obtained:  Verbal   Consent given by:  Patient   Risks, benefits, and alternatives were discussed: yes     Risks discussed:  Bleeding, wound separation and pain   Alternatives discussed:  No treatment, delayed treatment, alternative treatment, observation  and referral Universal protocol:    Procedure explained and questions answered to patient or proxy's satisfaction: yes     Patient identity confirmed:  Verbally with patient Location:    Location:  [place] location:  Eyebrow   Eyebrow location:  L eyebrow Procedure details:    Wound appearance:  No signs of infection, good wound healing and clean   Number of sutures removed:  5 Post-procedure details:    Post-removal:  No dressing applied   Procedure completion:  Tolerated well, no immediate complications     Medications Ordered in ED Medications  lidocaine (LIDODERM) 5 % 1 patch (has no administration in time range)    ED Course/ Medical Decision Making/ A&P                           Medical Decision Making Risk Prescription drug management.   This patient presents to the ED for concern of suture removal, this involves an extensive number of treatment options, and is a complaint that carries with it a high risk of complications and morbidity.  The differential diagnosis includes suture removal, cellulitis, abscess, dehiscence   Co morbidities that complicate the patient evaluation  See HPI   Additional history obtained:  Additional history obtained from EMR External records from outside source obtained and reviewed including prior hospital record indicating  suture placement.   Lab Tests:  N/a   Imaging Studies ordered:  N/a   Cardiac Monitoring: / EKG:  The patient was maintained on a cardiac monitor.  I personally viewed and interpreted the cardiac monitored which showed an underlying rhythm of: Sinus rhythm   Consultations Obtained:  N/a   Problem List / ED Course / Critical interventions / Medication management  Suture removal I ordered medication including Lidoderm patch for back pain Reevaluation of the patient after these medicines showed that the patient improved I have reviewed the patients home medicines and have made adjustments as needed   Social Determinants of Health:  Denies tobacco, illicit drug use.   Test / Admission - Considered:  Suture removal/sciatica Vitals signs significant for hypertension with blood pressure 160/106.  Recommend follow-up with PCP regarding elevation of blood pressure.. Otherwise within normal range and stable throughout visit. Patient's 5 sutures were removed as indicated above.  No signs of secondary skin infection.  Patient also elicited signs of left-sided sciatica as indicated above.  Patient indicated no red flags for back p  Doubt cauda equina.  Doubt spinal abscess, doubt acute fracture/dislocation.  Will treat with continued course of prednisone as well as Lidoderm patches per patient request.  Upon further chart review, patient received prednisone taper through 01/30/2022 so further prednisone prescription will not be given at this time.  Recommend follow-up with primary care/neurosurgery for reevaluation of symptoms.  Treatment plan discussed length with patient and he knowledge understanding was agreeable to said plan.  Patient educated regarding healing of laceration care. Worrisome signs and symptoms were discussed with the patient, and the patient acknowledged understanding to return to the ED if noticed. Patient was stable upon discharge.          Final Clinical  Impression(s) / ED Diagnoses Final diagnoses:  Visit for suture removal  Sciatica of right side    Rx / DC Orders ED Discharge Orders          Ordered    lidocaine (LIDODERM) 5 %  Every 24 hours  01/15/22 2100              Wilnette Kales, PA 01/15/22 2101    Elgie Congo, MD 01/15/22 [phone]

## 2022-01-15 NOTE — ED Triage Notes (Signed)
Pt in today for suture removal from left eyebrow.  He also says he is having right sciatica pain and wants that evaluated.

## 2022-01-15 NOTE — Discharge Instructions (Addendum)
The visit to the emergency department today was overall reassuring.  As we discussed, apply Vaseline, cocoa butter, lotion over site of laceration.  Keep area covered in the sun to decrease likelihood of more noticeable scar.  For right-sided sciatica type symptoms, will prescribe Lidoderm patch as well as continued course of prednisone as we discussed.  Upon prior discharge, your prednisone taper as scheduled through 01/28/2022 so continue taking medication as prescribed as this will help your sciatica type pain.  Take already prescribed pain medication as needed.  Follow-up with neurosurgery recommended if this becomes a more chronic condition.  Please not hesitate to return to the emergency department for worrisome signs and symptoms we discussed become apparent.

## 2022-01-16 ENCOUNTER — Other Ambulatory Visit: Payer: Self-pay

## 2022-01-16 ENCOUNTER — Encounter (HOSPITAL_BASED_OUTPATIENT_CLINIC_OR_DEPARTMENT_OTHER): Payer: Self-pay

## 2022-01-16 ENCOUNTER — Emergency Department (HOSPITAL_BASED_OUTPATIENT_CLINIC_OR_DEPARTMENT_OTHER)
Admission: EM | Admit: 2022-01-16 | Discharge: 2022-01-17 | Disposition: A | Discharge status: Home / Self Care | Payer: BC Managed Care – PPO | Attending: Emergency Medicine | Admitting: Emergency Medicine

## 2022-01-16 DIAGNOSIS — Z7984 Long term (current) use of oral hypoglycemic drugs: Secondary | ICD-10-CM | POA: Diagnosis not present

## 2022-01-16 DIAGNOSIS — M5431 Sciatica, right side: Secondary | ICD-10-CM | POA: Diagnosis not present

## 2022-01-16 DIAGNOSIS — Z8616 Personal history of COVID-19: Secondary | ICD-10-CM | POA: Diagnosis not present

## 2022-01-16 DIAGNOSIS — E119 Type 2 diabetes mellitus without complications: Secondary | ICD-10-CM | POA: Diagnosis not present

## 2022-01-16 DIAGNOSIS — Z79899 Other long term (current) drug therapy: Secondary | ICD-10-CM | POA: Insufficient documentation

## 2022-01-16 DIAGNOSIS — I10 Essential (primary) hypertension: Secondary | ICD-10-CM | POA: Insufficient documentation

## 2022-01-16 DIAGNOSIS — M549 Dorsalgia, unspecified: Secondary | ICD-10-CM | POA: Diagnosis not present

## 2022-01-16 DIAGNOSIS — M5441 Lumbago with sciatica, right side: Secondary | ICD-10-CM | POA: Diagnosis not present

## 2022-01-16 DIAGNOSIS — Z794 Long term (current) use of insulin: Secondary | ICD-10-CM | POA: Diagnosis not present

## 2022-01-16 NOTE — ED Triage Notes (Signed)
Patient here POV from Home.  Endorses Back Pain (Right Lower) related to Sciatica. No Recent Trauma and was seen yesterday for Same. No Dysuria or Hematuria.   NAD noted during Triage. A&Ox4. GCS 15. BIB Wheelchair.

## 2022-01-17 MED ORDER — LIDOCAINE 5 % EX PTCH
1.0000 | MEDICATED_PATCH | CUTANEOUS | Status: DC
  Administered 2022-01-17: 1 via TRANSDERMAL
  Filled 2022-01-17: qty 1

## 2022-01-17 MED ORDER — OXYCODONE-ACETAMINOPHEN 5-325 MG PO TABS
2.0000 | ORAL_TABLET | Freq: Once | ORAL | Status: AC
  Administered 2022-01-17: 2 via ORAL
  Filled 2022-01-17: qty 2

## 2022-01-17 NOTE — ED Provider Notes (Signed)
[facility] Provider Note: Georgena Spurling, MD, FACEP  CSN: [id number] MRN: [id number] ARRIVAL: 01/16/22 at 2213 ROOM: [facility]  Back Pain   HISTORY OF PRESENT ILLNESS  01/17/22 12:45 AM Patrick [name] is a 57 y.o. male with a history of right-sided sciatica seen on 03/17/2021 and prescribed lidocaine patches.  He has had 2 prescriptions for 20 Percocet tablets this month, most recently filled 01/13/2022.  He was unable to the lidocaine patches filled due to insurance limitations.  He understands that he cannot be given any more narcotic prescriptions.  He is requesting something to help his pain tonight until he can see his regular provider.  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  Past Medical History:  Diagnosis Date   Anemia    Diabetes mellitus    Gout    History of COVID-19 04/2019   Hypertension    PVD (peripheral vascular disease) ([place])    s/p R BKA    Past Surgical History:  Procedure Laterality Date   AMPUTATION Right 06/06/2020   Procedure: AMPUTATION 5TH RAY;  Surgeon: [name], MD;  Location: [facility];  Service: Orthopedics;  Laterality: Right;   AMPUTATION Right 06/08/2020   Procedure: RIGHT 4TH RAY AMPUTATION;  Surgeon: [name], MD;  Location: [facility];  Service: Orthopedics;  Laterality: Right;   AMPUTATION Right 08/27/2020   Procedure: AMPUTATION BELOW KNEE;  Surgeon: Jessy Oto, MD;  Location: [facility];  Service: Orthopedics;  Laterality: Right;   AMPUTATION Left 12/30/2020   Procedure: LEFT BELOW KNEE AMPUTATION;  Surgeon: [name], MD;  Location: [facility];  Service: Orthopedics;  Laterality: Left;   CHOLECYSTECTOMY N/A 09/25/2020   Procedure: LAPAROSCOPIC CHOLECYSTECTOMY;  Surgeon: Jesusita Oka, MD;  Location: [facility];  Service: General;  Laterality: N/A;    Family History  Problem Relation Age of Onset   Diabetes Neg Hx      Social History   Tobacco Use   Smoking status: Never   Smokeless tobacco: Never  Vaping Use   Vaping Use: Never used  Substance Use Topics   Alcohol use: No   Drug use: No    Prior to Admission medications   Medication Sig Start Date End Date Taking? Authorizing Provider  allopurinol (ZYLOPRIM) 300 MG tablet Take 0.5 tablets (150 mg total) by mouth 2 (two) times daily. 01/11/22 04/11/22  [place] Donnamarie Poag, DO  amLODipine (NORVASC) 5 MG tablet Take 5 mg by mouth daily. Patient not taking: Reported on 01/10/2022 08/18/21   [provider]  blood glucose meter kit and supplies KIT Dispense based on patient and insurance preference. Use up to four times daily as directed. 01/05/22   Little Ishikawa, MD  colchicine 0.6 MG tablet Take 2 tablets (1.2 mg) during a gout flareup, then take 1 tablet an hour later. Patient taking differently: Take 0.6-1.2 mg by mouth See admin instructions. Take 2 tablets (1.2 mg) during a gout flareup, then take 1 tablet (0.6 mg) an hour later. 12/16/21   Veryl Speak, MD  gabapentin (NEURONTIN) 300 MG capsule Take 1 capsule (300 mg total) by mouth 3 (three) times daily. Patient taking differently: Take 300 mg by mouth daily as needed (pain). 09/08/20   Angiulli, Lavon Paganini, PA-C  insulin aspart (NOVOLOG FLEXPEN) 100 UNIT/ML FlexPen Inject 4 Units into the skin 3 (three) times daily with meals.  Patient taking differently: Inject 4 Units into the skin 2 (two) times daily. 09/08/20   Angiulli, Lavon Paganini, PA-C  insulin glargine (LANTUS SOLOSTAR) 100 UNIT/ML Solostar Pen Inject 25 Units into the skin 2 (two) times daily. Patient taking differently: Inject 15 Units into the skin 2 (two) times daily. 09/08/20   Angiulli, Lavon Paganini, PA-C  Insulin Pen Needle 32G X 4 MM MISC Use as directed 09/08/20   [name], Clide Deutscher, MD  Insulin Pen Needle 32G X 4 MM MISC Use as directed 09/09/20   Angiulli, Lavon Paganini, PA-C  JARDIANCE 25 MG TABS tablet Take 25 mg by mouth  daily. Patient not taking: Reported on 01/10/2022 09/28/21   [provider]  lidocaine (LIDODERM) 5 % Place 1 patch onto the skin daily. Remove & Discard patch within 12 hours or as directed by MD 01/15/22   Dion Saucier A, PA  metoprolol tartrate (LOPRESSOR) 25 MG tablet Take 0.5 tablets (12.5 mg total) by mouth 2 (two) times daily. Patient taking differently: Take 25 mg by mouth daily. 09/08/20   Angiulli, Lavon Paganini, PA-C  niMODipine (NIMOTOP) 30 MG capsule Take 2 capsules (60 mg total) by mouth every 4 (four) hours for 20 days. 01/05/22 01/25/22  Little Ishikawa, MD  oxyCODONE (ROXICODONE) 5 MG immediate release tablet Take 1 tablet (5 mg total) by mouth every 8 (eight) hours as needed for moderate pain. 01/11/22 01/11/23  [place] [name], DO  OZEMPIC, 1 MG/DOSE, 4 MG/3ML SOPN Inject 1 mg into the skin once a week. 01/01/22   [provider]  predniSONE (DELTASONE) 10 MG tablet Take 5 tablets (50 mg total) by mouth daily for 5 days, THEN 4 tablets (40 mg total) daily for 3 days, THEN 3 tablets (30 mg total) daily for 3 days, THEN 2 tablets (20 mg total) daily for 3 days, THEN 1 tablet (10 mg total) daily for 3 days. 01/12/22 01/29/22  [place] Donnamarie Poag, DO  simvastatin (ZOCOR) 10 MG tablet Take 10 mg by mouth at bedtime. 12/08/21   [provider]  tiZANidine (ZANAFLEX) 4 MG tablet Take 1 tablet (4 mg total) by mouth 2 (two) times daily as needed for muscle spasms. 09/29/20   Lavina Hamman, MD    Allergies Patient has no known allergies.   REVIEW OF SYSTEMS  Negative except as noted here or in the History of Present Illness.   PHYSICAL EXAMINATION  Initial Vital Signs Blood pressure (!) 145/94, pulse (!) 114, temperature 98 F (36.7 C), resp. rate 18, height _0  (1.88 m), weight (!) 141.4 kg, SpO2 98 %.  Examination General: Well-developed, well-nourished male in no acute distress; appearance consistent with age of record HENT: normocephalic;  atraumatic Eyes: Normal appearance Neck: supple Heart: regular rate and rhythm Lungs: clear to auscultation bilaterally Abdomen: soft; nondistended; nontender; bowel sounds present Back: Pain on movement Extremities: Bilateral BKA Neurologic: Awake, alert and oriented; motor function intact in all extremities and symmetric; no facial droop Skin: Warm and dry Psychiatric: Normal mood and affect   RESULTS  Summary of this visit's results, reviewed and interpreted by myself:   EKG Interpretation  Date/Time:    Ventricular Rate:    PR Interval:    QRS Duration:   QT Interval:    QTC Calculation:   R Axis:     Text Interpretation:         Laboratory Studies: No results found for this or any previous visit (from the past 24 hour(s)). Imaging Studies:  No results found.  ED COURSE and MDM  Nursing notes, initial and subsequent vitals signs, including pulse oximetry, reviewed and interpreted by myself.  Vitals:   01/16/22 2234 01/16/22 2234 01/16/22 2345  BP:  (!) 133/96 (!) 145/94  Pulse:  (!) 114   Resp:  18   Temp:  98 F (36.7 C)   SpO2:  98%   Weight: (!) 141.4 kg    Height: _0  (1.88 m)     Medications  lidocaine (LIDODERM) 5 % 1 patch (has no administration in time range)  oxyCODONE-acetaminophen (PERCOCET/ROXICET) 5-325 MG per tablet 2 tablet (has no administration in time range)   As noted above the patient understands he cannot be given a prescription for additional narcotics but we will treat his pain while in the ED.   PROCEDURES  Procedures   ED DIAGNOSES     ICD-10-CM   1. Sciatica of right side  M54.31          Myasia Sinatra, Yareth, MD 01/17/22 [phone]

## 2022-02-02 ENCOUNTER — Other Ambulatory Visit (HOSPITAL_BASED_OUTPATIENT_CLINIC_OR_DEPARTMENT_OTHER): Payer: Self-pay

## 2022-03-26 ENCOUNTER — Emergency Department (HOSPITAL_BASED_OUTPATIENT_CLINIC_OR_DEPARTMENT_OTHER)
Admission: EM | Admit: 2022-03-26 | Discharge: 2022-03-26 | Discharge status: Against Medical Advice | Payer: BC Managed Care – PPO | Attending: Emergency Medicine | Admitting: Emergency Medicine

## 2022-03-26 ENCOUNTER — Emergency Department (HOSPITAL_BASED_OUTPATIENT_CLINIC_OR_DEPARTMENT_OTHER): Payer: BC Managed Care – PPO

## 2022-03-26 ENCOUNTER — Other Ambulatory Visit: Payer: Self-pay

## 2022-03-26 DIAGNOSIS — Z89512 Acquired absence of left leg below knee: Secondary | ICD-10-CM | POA: Diagnosis not present

## 2022-03-26 DIAGNOSIS — I161 Hypertensive emergency: Secondary | ICD-10-CM | POA: Insufficient documentation

## 2022-03-26 DIAGNOSIS — E1165 Type 2 diabetes mellitus with hyperglycemia: Secondary | ICD-10-CM | POA: Insufficient documentation

## 2022-03-26 DIAGNOSIS — Z7984 Long term (current) use of oral hypoglycemic drugs: Secondary | ICD-10-CM | POA: Diagnosis not present

## 2022-03-26 DIAGNOSIS — E1122 Type 2 diabetes mellitus with diabetic chronic kidney disease: Secondary | ICD-10-CM | POA: Insufficient documentation

## 2022-03-26 DIAGNOSIS — N189 Chronic kidney disease, unspecified: Secondary | ICD-10-CM | POA: Diagnosis not present

## 2022-03-26 DIAGNOSIS — I129 Hypertensive chronic kidney disease with stage 1 through stage 4 chronic kidney disease, or unspecified chronic kidney disease: Secondary | ICD-10-CM | POA: Diagnosis not present

## 2022-03-26 DIAGNOSIS — Z89511 Acquired absence of right leg below knee: Secondary | ICD-10-CM | POA: Diagnosis not present

## 2022-03-26 DIAGNOSIS — Z760 Encounter for issue of repeat prescription: Secondary | ICD-10-CM | POA: Insufficient documentation

## 2022-03-26 DIAGNOSIS — Z79899 Other long term (current) drug therapy: Secondary | ICD-10-CM | POA: Diagnosis not present

## 2022-03-26 DIAGNOSIS — Z794 Long term (current) use of insulin: Secondary | ICD-10-CM | POA: Insufficient documentation

## 2022-03-26 DIAGNOSIS — I10 Essential (primary) hypertension: Secondary | ICD-10-CM | POA: Diagnosis not present

## 2022-03-26 DIAGNOSIS — R519 Headache, unspecified: Secondary | ICD-10-CM | POA: Diagnosis not present

## 2022-03-26 DIAGNOSIS — Z5329 Procedure and treatment not carried out because of patient's decision for other reasons: Secondary | ICD-10-CM | POA: Insufficient documentation

## 2022-03-26 LAB — CBC WITH DIFFERENTIAL/PLATELET
Abs Immature Granulocytes: 0.03 10*3/uL (ref 0.00–0.07)
Basophils Absolute: 0 10*3/uL (ref 0.0–0.1)
Basophils Relative: 1 %
Eosinophils Absolute: 0.2 10*3/uL (ref 0.0–0.5)
Eosinophils Relative: 2 %
HCT: 28.9 % — ABNORMAL LOW (ref 39.0–52.0)
Hemoglobin: 9.1 g/dL — ABNORMAL LOW (ref 13.0–17.0)
Immature Granulocytes: 0 %
Lymphocytes Relative: 26 %
Lymphs Abs: 2 10*3/uL (ref 0.7–4.0)
MCH: 27.3 pg (ref 26.0–34.0)
MCHC: 31.5 g/dL (ref 30.0–36.0)
MCV: 86.8 fL (ref 80.0–100.0)
Monocytes Absolute: 0.5 10*3/uL (ref 0.1–1.0)
Monocytes Relative: 7 %
Neutro Abs: 5.1 10*3/uL (ref 1.7–7.7)
Neutrophils Relative %: 64 %
Platelets: 352 10*3/uL (ref 150–400)
RBC: 3.33 MIL/uL — ABNORMAL LOW (ref 4.22–5.81)
RDW: 15.3 % (ref 11.5–15.5)
WBC: 7.9 10*3/uL (ref 4.0–10.5)
nRBC: 0 % (ref 0.0–0.2)

## 2022-03-26 LAB — BASIC METABOLIC PANEL
Anion gap: 11 (ref 5–15)
BUN: 39 mg/dL — ABNORMAL HIGH (ref 6–20)
CO2: 22 mmol/L (ref 22–32)
Calcium: 7.2 mg/dL — ABNORMAL LOW (ref 8.9–10.3)
Chloride: 107 mmol/L (ref 98–111)
Creatinine, Ser: 3.26 mg/dL — ABNORMAL HIGH (ref 0.61–1.24)
GFR, Estimated: 22 mL/min — ABNORMAL LOW (ref >60)
Glucose, Bld: 146 mg/dL — ABNORMAL HIGH (ref 70–99)
Potassium: 3.5 mmol/L (ref 3.5–5.1)
Sodium: 140 mmol/L (ref 135–145)

## 2022-03-26 LAB — TROPONIN I (HIGH SENSITIVITY): Troponin I (High Sensitivity): 53 ng/L — ABNORMAL HIGH (ref <18)

## 2022-03-26 LAB — CBG MONITORING, ED: Glucose-Capillary: 141 mg/dL — ABNORMAL HIGH (ref 70–99)

## 2022-03-26 MED ORDER — HYDRALAZINE HCL 20 MG/ML IJ SOLN
20.0000 mg | Freq: Once | INTRAMUSCULAR | Status: AC
  Administered 2022-03-26: 20 mg via INTRAVENOUS
  Filled 2022-03-26: qty 1

## 2022-03-26 MED ORDER — METOPROLOL TARTRATE 25 MG PO TABS: 25.0000 mg | ORAL_TABLET | Freq: Every day | ORAL | 0 refills | Status: DC

## 2022-03-26 MED ORDER — AMLODIPINE BESYLATE 5 MG PO TABS: 5.0000 mg | ORAL_TABLET | Freq: Every day | ORAL | 0 refills | Status: DC

## 2022-03-26 MED ORDER — ACETAMINOPHEN 500 MG PO TABS
1000.0000 mg | ORAL_TABLET | Freq: Once | ORAL | Status: AC
  Administered 2022-03-26: 1000 mg via ORAL
  Filled 2022-03-26: qty 2

## 2022-03-26 NOTE — Discharge Instructions (Addendum)
Your blood pressure was uncontrolled be high and it was recommended that you be admitted to the hospital due to evidence of heart damage in the setting of uncontrolled blood pressure however you declined admission.  I recommend that you present back to the hospital tomorrow for consideration for admission and if your blood pressure remains uncontrollably high at home.  We will prescribe antihypertensives for you.  Please return to the emergency department in the event of chest pain, shortness of breath, headache, strokelike symptoms such as weakness, numbness, facial droop, difficulty swallowing or speaking.  You are at risk for significant morbidity and mortality meaning risk to life and limb by choosing to go home today.

## 2022-03-26 NOTE — ED Provider Notes (Signed)
[facility] Provider Note   CSN: [id number] Arrival date & time: 03/26/22  1211     History  Chief Complaint  Patient presents with   Medication Refill   Hypertension    Patrick [name] is a 57 y.o. male.   Medication Refill Hypertension     57 year old male with medical history significant for obesity, hypertension, CKD, DM 2, diabetic ulceration with gangrene, osteomyelitis status post BKA's bilaterally presenting to the emergency department with a need for medication refill in the setting of being off his antihypertensive medications for months.  The patient endorses headache, denies any blurry vision, denies any chest pain or shortness of breath, denies any abdominal pain or back pain.  He was seen outpatient earlier today and had uncontrollably high blood pressures and was sent to the emergency department for further evaluation.  Home Medications Prior to Admission medications   Medication Sig Start Date End Date Taking? Authorizing Provider  allopurinol (ZYLOPRIM) 300 MG tablet Take 0.5 tablets (150 mg total) by mouth 2 (two) times daily. 01/11/22 04/11/22  [place] Donnamarie Poag, DO  amLODipine (NORVASC) 5 MG tablet Take 1 tablet (5 mg total) by mouth daily. 03/26/22 04/25/22  Regan Lemming, MD  blood glucose meter kit and supplies KIT Dispense based on patient and insurance preference. Use up to four times daily as directed. 01/05/22   Little Ishikawa, MD  colchicine 0.6 MG tablet Take 2 tablets (1.2 mg) during a gout flareup, then take 1 tablet an hour later. Patient taking differently: Take 0.6-1.2 mg by mouth See admin instructions. Take 2 tablets (1.2 mg) during a gout flareup, then take 1 tablet (0.6 mg) an hour later. 12/16/21   Veryl Speak, MD  gabapentin (NEURONTIN) 300 MG capsule Take 1 capsule (300 mg total) by mouth 3 (three) times daily. Patient taking differently: Take 300 mg by mouth daily as needed (pain). 09/08/20    Angiulli, Lavon Paganini, PA-C  insulin aspart (NOVOLOG FLEXPEN) 100 UNIT/ML FlexPen Inject 4 Units into the skin 3 (three) times daily with meals. Patient taking differently: Inject 4 Units into the skin 2 (two) times daily. 09/08/20   Angiulli, Lavon Paganini, PA-C  insulin glargine (LANTUS SOLOSTAR) 100 UNIT/ML Solostar Pen Inject 25 Units into the skin 2 (two) times daily. Patient taking differently: Inject 15 Units into the skin 2 (two) times daily. 09/08/20   Angiulli, Lavon Paganini, PA-C  Insulin Pen Needle 32G X 4 MM MISC Use as directed 09/08/20   [name], Clide Deutscher, MD  Insulin Pen Needle 32G X 4 MM MISC Use as directed 09/09/20   Angiulli, Lavon Paganini, PA-C  JARDIANCE 25 MG TABS tablet Take 25 mg by mouth daily. Patient not taking: Reported on 01/10/2022 09/28/21   [provider]  lidocaine (LIDODERM) 5 % Place 1 patch onto the skin daily. Remove & Discard patch within 12 hours or as directed by MD 01/15/22   Dion Saucier A, PA  metoprolol tartrate (LOPRESSOR) 25 MG tablet Take 1 tablet (25 mg total) by mouth daily. 03/26/22 04/25/22  Regan Lemming, MD  niMODipine (NIMOTOP) 30 MG capsule Take 2 capsules (60 mg total) by mouth every 4 (four) hours for 20 days. 01/05/22 01/25/22  Little Ishikawa, MD  oxyCODONE (ROXICODONE) 5 MG immediate release tablet Take 1 tablet (5 mg total) by mouth every 8 (eight) hours as needed for moderate pain. 01/11/22 01/11/23  [place] [name], DO  OZEMPIC, 1 MG/DOSE, 4 MG/3ML SOPN Inject 1  mg into the skin once a week. 01/01/22   [provider]  simvastatin (ZOCOR) 10 MG tablet Take 10 mg by mouth at bedtime. 12/08/21   [provider]  tiZANidine (ZANAFLEX) 4 MG tablet Take 1 tablet (4 mg total) by mouth 2 (two) times daily as needed for muscle spasms. 09/29/20   Lavina Hamman, MD      Allergies    Patient has no known allergies.    Review of Systems   Review of Systems  All other systems reviewed and are negative.   Physical Exam Updated  Vital Signs BP (!) 182/113   Pulse 82   Temp 98.2 F (36.8 C) (Oral)   Resp 19   Ht 6\' 3"  (1.905 m)   Wt 136.1 kg   SpO2 100%   BMI 37.50 kg/m  Physical Exam Vitals and nursing note reviewed.  Constitutional:      General: He is not in acute distress.    Appearance: He is well-developed. He is obese.  HENT:     Head: Normocephalic and atraumatic.  Eyes:     Conjunctiva/sclera: Conjunctivae normal.  Cardiovascular:     Rate and Rhythm: Normal rate and regular rhythm.  Pulmonary:     Effort: Pulmonary effort is normal. No respiratory distress.     Breath sounds: Normal breath sounds.  Abdominal:     Palpations: Abdomen is soft.     Tenderness: There is no abdominal tenderness.  Musculoskeletal:        General: No swelling.     Cervical back: Neck supple.     Comments: Status post bilateral BKA's.  Skin:    General: Skin is warm and dry.     Capillary Refill: Capillary refill takes less than 2 seconds.  Neurological:     General: No focal deficit present.     Mental Status: He is alert and oriented to person, place, and time. Mental status is at baseline.     Cranial Nerves: No cranial nerve deficit.     Sensory: No sensory deficit.     Motor: No weakness.  Psychiatric:        Mood and Affect: Mood normal.     ED Results / Procedures / Treatments   Labs (all labs ordered are listed, but only abnormal results are displayed) Labs Reviewed  BASIC METABOLIC PANEL - Abnormal; Notable for the following components:      Result Value   Glucose, Bld 146 (*)    BUN 39 (*)    Creatinine, Ser 3.26 (*)    Calcium 7.2 (*)    GFR, Estimated 22 (*)    All other components within normal limits  CBC WITH DIFFERENTIAL/PLATELET - Abnormal; Notable for the following components:   RBC 3.33 (*)    Hemoglobin 9.1 (*)    HCT 28.9 (*)    All other components within normal limits  CBG MONITORING, ED - Abnormal; Notable for the following components:   Glucose-Capillary 141 (*)     All other components within normal limits  TROPONIN I (HIGH SENSITIVITY) - Abnormal; Notable for the following components:   Troponin I (High Sensitivity) 53 (*)    All other components within normal limits  TROPONIN I (HIGH SENSITIVITY)    EKG EKG Interpretation  Date/Time:  Monday March 26 2022 16:39:27 EST Ventricular Rate:  83 PR Interval:  213 QRS Duration: 103 QT Interval:  405 QTC Calculation: 476 R Axis:   -19 Text Interpretation: Sinus rhythm Prolonged  PR interval Borderline left axis deviation Consider anterior infarct Confirmed by Regan Lemming ([id number]) on 03/26/2022 5:08:23 PM  Radiology DG Chest Portable 1 View  Result Date: 03/26/2022 CLINICAL DATA:  Hypertension. EXAM: PORTABLE CHEST 1 VIEW COMPARISON:  January 10, 2022. FINDINGS: Stable cardiomediastinal silhouette. Both lungs are clear. The visualized skeletal structures are unremarkable. IMPRESSION: No active disease. Electronically Signed   By: Marijo Conception M.D.   On: 03/26/2022 16:40    Procedures Procedures    Medications Ordered in ED Medications  hydrALAZINE (APRESOLINE) injection 20 mg (20 mg Intravenous Given 03/26/22 1803)  acetaminophen (TYLENOL) tablet 1,000 mg (1,000 mg Oral Given 03/26/22 1633)    ED Course/ Medical Decision Making/ A&P Clinical Course as of 03/26/22 1904  Mon Mar 26, 2022  1706 BP(!): 233/117 [JL]  1707 Troponin I (High Sensitivity)(!): 53 [JL]    Clinical Course User Index [JL] Regan Lemming, MD                             Medical Decision Making Amount and/or Complexity of Data Reviewed Labs:  Decision-making details documented in ED Course. Radiology: ordered.  Risk OTC drugs. Prescription drug management.    57 year old male with medical history significant for obesity, hypertension, CKD, DM 2, diabetic ulceration with gangrene, osteomyelitis status post BKA's bilaterally presenting to the emergency department with a need for medication refill in the setting of  being off his antihypertensive medications for months.  The patient endorses headache, denies any blurry vision, denies any chest pain or shortness of breath, denies any abdominal pain or back pain.  He was seen outpatient earlier today and had uncontrollably high blood pressures and was sent to the emergency department for further evaluation.  On arrival, the patient was afebrile, not tachycardic, mildly tachypneic RR 24, hypertensive BP 233/117, saturating 98% on room air.  EKG revealed sinus rhythm, prolonged PR, no STEMI, Q waves in the anterior leads.  Chest x-ray was performed revealed no active disease.  Differential diagnosis includes uncontrolled hypertension, hypertensive emergency, lower suspicion for ACS or other acute intrathoracic abnormality.  Patient has intact neurologic exam with mild headache, no neurologic deficits.  Low concern for CVA.  Laboratory evaluation significant for CBC without a leukocytosis, stable anemia to 9.1, CBG 141, BMP with elevated serum creatinine but stable at baseline for CKD at 3.26, elevated troponin noted to 53.  Repeat pending.  The patient was administered 20 mg of IV hydralazine with improvement in his blood pressure to 182/113 however he does have evidence for hypertensive emergency with uncontrolled blood pressures and an elevated troponin.  Recommend mission for observation in the setting of hypertensive emergency with evidence of endorgan damage with an elevated troponin.  The patient declined admission and preferred to sign out AMA.  He will check his blood pressures at home and follow-up at the [facility] emergency department if he continues to endorse concerns.  He requested a refill of his antihypertensives at home which was provided.  I provided strict return precautions the event of worsening symptoms of stroke, chest pain, shortness of breath, abdominal pain in the setting of uncontrolled high blood pressure.  After being informed of the risk of  morbidity and locality, the patient still elected to sign out [place].  DC Instructions: Your blood pressure was uncontrolled be high and it was recommended that you be admitted to the hospital due to evidence of heart damage in the  setting of uncontrolled blood pressure however you declined admission.  I recommend that you present back to the hospital tomorrow for consideration for admission and if your blood pressure remains uncontrollably high at home.  We will prescribe antihypertensives for you.  Please return to the emergency department in the event of chest pain, shortness of breath, headache, strokelike symptoms such as weakness, numbness, facial droop, difficulty swallowing or speaking.  You are at risk for significant morbidity and mortality meaning risk to life and limb by choosing to go home today.   Final Clinical Impression(s) / ED Diagnoses Final diagnoses:  Hypertensive emergency    Rx / DC Orders ED Discharge Orders          Ordered    amLODipine (NORVASC) 5 MG tablet  Daily        03/26/22 1900    metoprolol tartrate (LOPRESSOR) 25 MG tablet  Daily        03/26/22 1900              Regan Lemming, MD 03/26/22 1905

## 2022-03-26 NOTE — ED Notes (Signed)
Pt signed AMA form.

## 2022-03-26 NOTE — ED Triage Notes (Signed)
Pt to ED c/o of medication refill, reports off medication x 4 months. HX HTN and DM.

## 2022-04-01 DIAGNOSIS — I10 Essential (primary) hypertension: Secondary | ICD-10-CM | POA: Diagnosis not present

## 2022-04-01 DIAGNOSIS — Z7984 Long term (current) use of oral hypoglycemic drugs: Secondary | ICD-10-CM | POA: Diagnosis not present

## 2022-04-01 DIAGNOSIS — Z794 Long term (current) use of insulin: Secondary | ICD-10-CM | POA: Diagnosis not present

## 2022-04-01 DIAGNOSIS — M7989 Other specified soft tissue disorders: Secondary | ICD-10-CM | POA: Diagnosis not present

## 2022-04-01 DIAGNOSIS — M5431 Sciatica, right side: Secondary | ICD-10-CM | POA: Diagnosis not present

## 2022-04-01 DIAGNOSIS — E119 Type 2 diabetes mellitus without complications: Secondary | ICD-10-CM | POA: Diagnosis not present

## 2022-04-01 DIAGNOSIS — Z8616 Personal history of COVID-19: Secondary | ICD-10-CM | POA: Insufficient documentation

## 2022-04-01 DIAGNOSIS — M109 Gout, unspecified: Secondary | ICD-10-CM | POA: Diagnosis not present

## 2022-04-01 DIAGNOSIS — M10041 Idiopathic gout, right hand: Secondary | ICD-10-CM | POA: Diagnosis not present

## 2022-04-01 DIAGNOSIS — Z79899 Other long term (current) drug therapy: Secondary | ICD-10-CM | POA: Insufficient documentation

## 2022-04-02 ENCOUNTER — Other Ambulatory Visit: Payer: Self-pay

## 2022-04-02 ENCOUNTER — Encounter (HOSPITAL_BASED_OUTPATIENT_CLINIC_OR_DEPARTMENT_OTHER): Payer: Self-pay | Admitting: Emergency Medicine

## 2022-04-02 ENCOUNTER — Emergency Department (HOSPITAL_BASED_OUTPATIENT_CLINIC_OR_DEPARTMENT_OTHER)
Admission: EM | Admit: 2022-04-02 | Discharge: 2022-04-02 | Disposition: A | Discharge status: Home / Self Care | Payer: BC Managed Care – PPO | Attending: Emergency Medicine | Admitting: Emergency Medicine

## 2022-04-02 DIAGNOSIS — M10041 Idiopathic gout, right hand: Secondary | ICD-10-CM | POA: Diagnosis not present

## 2022-04-02 DIAGNOSIS — M5430 Sciatica, unspecified side: Secondary | ICD-10-CM

## 2022-04-02 DIAGNOSIS — M109 Gout, unspecified: Secondary | ICD-10-CM

## 2022-04-02 MED ORDER — OXYCODONE-ACETAMINOPHEN 5-325 MG PO TABS: 1.0000 | ORAL_TABLET | Freq: Four times a day (QID) | ORAL | 0 refills | Status: DC | PRN

## 2022-04-02 MED ORDER — METHYLPREDNISOLONE 4 MG PO TBPK: ORAL_TABLET | ORAL | 0 refills | Status: DC

## 2022-04-02 MED ORDER — DEXAMETHASONE SODIUM PHOSPHATE 10 MG/ML IJ SOLN
10.0000 mg | Freq: Once | INTRAMUSCULAR | Status: AC
  Administered 2022-04-02: 10 mg via INTRAMUSCULAR
  Filled 2022-04-02: qty 1

## 2022-04-02 MED ORDER — OXYCODONE-ACETAMINOPHEN 5-325 MG PO TABS
1.0000 | ORAL_TABLET | Freq: Once | ORAL | Status: AC
  Administered 2022-04-02: 1 via ORAL
  Filled 2022-04-02: qty 1

## 2022-04-02 NOTE — ED Triage Notes (Signed)
Pt presents to ED POV. Pt c/o R wrist pain and R sciatic pain. Pt reports that he has had hx of both. Hx of gout in his wrist. Pt c/o htn d/t pain.

## 2022-04-02 NOTE — ED Notes (Signed)
Patient verbalized understanding of discharge instructions.  He reports his friend is on the way to pick him up.  He is aware that he should not drive for 4-6 hours after his medication.

## 2022-04-02 NOTE — ED Provider Notes (Signed)
[facility] Provider Note   CSN: [id number] Arrival date & time: 04/01/22  2359     History  Chief Complaint  Patient presents with   Wrist Pain   Hip Pain    Patrick [name] is a 57 y.o. male.  HPI     This is a 57 year old male well-known to our emergency department who presents with concerns for right hand swelling and right leg pain.  He has a history of gout and sciatica.  He states that his symptoms are consistent with his prior symptoms.  Denies systemic symptoms.  Denies fevers.  Reports swelling of the right hand.  He reports compliance with his allopurinol.  He is not taking anything for pain.  He states that this is typical of a gouty flare for him.  He has not noted any redness.  Additionally he reports some right-sided back pain.  He states that he feels that he is compensating.  Pain is worse when he gets in and out of bed.  No bowel or bladder difficulty.  No weakness, numbness.  History of sciatica with the same.  Home Medications Prior to Admission medications   Medication Sig Start Date End Date Taking? Authorizing Provider  methylPREDNISolone (MEDROL DOSEPAK) 4 MG TBPK tablet Take as directed on packet 04/02/22  Yes Aasiyah Auerbach, Barbette Hair, MD  oxyCODONE-acetaminophen (PERCOCET/ROXICET) 5-325 MG tablet Take 1 tablet by mouth every 6 (six) hours as needed for severe pain. 04/02/22  Yes Darrin Koman, Barbette Hair, MD  allopurinol (ZYLOPRIM) 300 MG tablet Take 0.5 tablets (150 mg total) by mouth 2 (two) times daily. 01/11/22 04/11/22  [place] Donnamarie Poag, DO  amLODipine (NORVASC) 5 MG tablet Take 1 tablet (5 mg total) by mouth daily. 03/26/22 04/25/22  Regan Lemming, MD  blood glucose meter kit and supplies KIT Dispense based on patient and insurance preference. Use up to four times daily as directed. 01/05/22   Little Ishikawa, MD  colchicine 0.6 MG tablet Take 2 tablets (1.2 mg) during a gout flareup, then take 1 tablet an hour later. Patient  taking differently: Take 0.6-1.2 mg by mouth See admin instructions. Take 2 tablets (1.2 mg) during a gout flareup, then take 1 tablet (0.6 mg) an hour later. 12/16/21   Veryl Speak, MD  gabapentin (NEURONTIN) 300 MG capsule Take 1 capsule (300 mg total) by mouth 3 (three) times daily. Patient taking differently: Take 300 mg by mouth daily as needed (pain). 09/08/20   Angiulli, Lavon Paganini, PA-C  insulin aspart (NOVOLOG FLEXPEN) 100 UNIT/ML FlexPen Inject 4 Units into the skin 3 (three) times daily with meals. Patient taking differently: Inject 4 Units into the skin 2 (two) times daily. 09/08/20   Angiulli, Lavon Paganini, PA-C  insulin glargine (LANTUS SOLOSTAR) 100 UNIT/ML Solostar Pen Inject 25 Units into the skin 2 (two) times daily. Patient taking differently: Inject 15 Units into the skin 2 (two) times daily. 09/08/20   Angiulli, Lavon Paganini, PA-C  Insulin Pen Needle 32G X 4 MM MISC Use as directed 09/08/20   [name], Clide Deutscher, MD  Insulin Pen Needle 32G X 4 MM MISC Use as directed 09/09/20   Angiulli, Lavon Paganini, PA-C  JARDIANCE 25 MG TABS tablet Take 25 mg by mouth daily. Patient not taking: Reported on 01/10/2022 09/28/21   [provider]  lidocaine (LIDODERM) 5 % Place 1 patch onto the skin daily. Remove & Discard patch within 12 hours or as directed by MD 01/15/22   Unk Lightning,  Cooper A, PA  metoprolol tartrate (LOPRESSOR) 25 MG tablet Take 1 tablet (25 mg total) by mouth daily. 03/26/22 04/25/22  Regan Lemming, MD  niMODipine (NIMOTOP) 30 MG capsule Take 2 capsules (60 mg total) by mouth every 4 (four) hours for 20 days. 01/05/22 01/25/22  Little Ishikawa, MD  oxyCODONE (ROXICODONE) 5 MG immediate release tablet Take 1 tablet (5 mg total) by mouth every 8 (eight) hours as needed for moderate pain. 01/11/22 01/11/23  [place] [name], DO  OZEMPIC, 1 MG/DOSE, 4 MG/3ML SOPN Inject 1 mg into the skin once a week. 01/01/22   [provider]  simvastatin (ZOCOR) 10 MG tablet Take 10 mg by mouth  at bedtime. 12/08/21   [provider]  tiZANidine (ZANAFLEX) 4 MG tablet Take 1 tablet (4 mg total) by mouth 2 (two) times daily as needed for muscle spasms. 09/29/20   Lavina Hamman, MD      Allergies    Patient has no known allergies.    Review of Systems   Review of Systems  Constitutional:  Negative for fever.  Musculoskeletal:  Positive for back pain.       Hand pain and swelling  All other systems reviewed and are negative.   Physical Exam Updated Vital Signs BP (!) 160/73   Pulse 89   Temp 100.1 F (37.8 C)   Resp 18   SpO2 99%  Physical Exam Vitals and nursing note reviewed.  Constitutional:      Appearance: He is well-developed.     Comments: Morbidly obese  HENT:     Head: Normocephalic and atraumatic.  Eyes:     Pupils: Pupils are equal, round, and reactive to light.  Cardiovascular:     Rate and Rhythm: Normal rate and regular rhythm.     Heart sounds: Normal heart sounds. No murmur heard. Pulmonary:     Effort: Pulmonary effort is normal. No respiratory distress.     Breath sounds: Normal breath sounds. No wheezing.  Abdominal:     General: Bowel sounds are normal.     Palpations: Abdomen is soft.     Tenderness: There is no abdominal tenderness. There is no rebound.  Musculoskeletal:     Cervical back: Neck supple.     Comments: Right-sided dorsal hand swelling, no obvious erythema, normal range of motion at the wrist, 2+ DP pulse Bilateral BKA  Lymphadenopathy:     Cervical: No cervical adenopathy.  Skin:    General: Skin is warm and dry.  Neurological:     Mental Status: He is alert and oriented to person, place, and time.  Psychiatric:        Mood and Affect: Mood normal.     ED Results / Procedures / Treatments   Labs (all labs ordered are listed, but only abnormal results are displayed) Labs Reviewed - No data to display  EKG None  Radiology No results found.  Procedures Procedures    Medications Ordered in  ED Medications  dexamethasone (DECADRON) injection 10 mg (10 mg Intramuscular Given 04/02/22 0213)  oxyCODONE-acetaminophen (PERCOCET/ROXICET) 5-325 MG per tablet 1 tablet (1 tablet Oral Given 04/02/22 0213)    ED Course/ Medical Decision Making/ A&P                             Medical Decision Making Risk Prescription drug management.   This patient presents to the ED for concern of hand pain, leg pain,  this involves an extensive number of treatment options, and is a complaint that carries with it a high risk of complications and morbidity.  I considered the following differential and admission for this acute, potentially life threatening condition.  The differential diagnosis includes gout, fluid shifts, infection such as septic joint, sciatica, muscle spasm  MDM:    This is a 57 year old male who presents with right hand pain and leg pain.  He has not take anything for pain.  Has had similar pain in the past which she attributes to gout and sciatica.  He is overall nontoxic.  Vital signs notable for blood pressure of 160/73.  History of hypertension.  Reports compliance with medications but states that he believes that is related to pain.  Patient was given a dose of Decadron and Percocet.  Has a history of chronic kidney disease.  Noted to have a temperature of 100.1.  No reported fevers at home.  He has no other signs or symptoms of infection.  I have low suspicion at this time for septic joint.  I reviewed his chart extensively.  He has had multiple visits with similar hand swelling in the past which is improved with treatment for gout.  Will discharge with Medrol Dosepak and a short course of Percocet.  Database reviewed with last narcotic prescription in November.  Patient encouraged to continue to take his blood pressure medications.  No signs or symptoms of hypertensive urgency or emergency.  (Labs, imaging, consults)  Labs: I Ordered, and personally interpreted labs.  The pertinent  results include: None  Imaging Studies ordered: I ordered imaging studies including none I independently visualized and interpreted imaging. I agree with the radiologist interpretation  Additional history obtained from chart review.  External records from outside source obtained and reviewed including prior evaluations  Cardiac Monitoring: The patient was not maintained on a cardiac monitor.  If on the cardiac monitor, I personally viewed and interpreted the cardiac monitored which showed an underlying rhythm of: N/A  Reevaluation: After the interventions noted above, I reevaluated the patient and found that they have :improved  Social Determinants of Health:  lives independently  Disposition: Discharge  Co morbidities that complicate the patient evaluation  Past Medical History:  Diagnosis Date   Anemia    Diabetes mellitus    Gout    History of COVID-19 04/2019   Hypertension    PVD (peripheral vascular disease) ([place])    s/p R BKA     Medicines Meds ordered this encounter  Medications   dexamethasone (DECADRON) injection 10 mg   oxyCODONE-acetaminophen (PERCOCET/ROXICET) 5-325 MG per tablet 1 tablet   methylPREDNISolone (MEDROL DOSEPAK) 4 MG TBPK tablet    Sig: Take as directed on packet    Dispense:  21 tablet    Refill:  0   oxyCODONE-acetaminophen (PERCOCET/ROXICET) 5-325 MG tablet    Sig: Take 1 tablet by mouth every 6 (six) hours as needed for severe pain.    Dispense:  10 tablet    Refill:  0    I have reviewed the patients home medicines and have made adjustments as needed  Problem List / ED Course: Problem List Items Addressed This Visit       Other   GOUT - Primary   Other Visit Diagnoses     Sciatic leg pain                       Final Clinical Impression(s) / ED Diagnoses Final  diagnoses:  Acute gout of right hand, unspecified cause  Sciatic leg pain    Rx / DC Orders ED Discharge Orders          Ordered     methylPREDNISolone (MEDROL DOSEPAK) 4 MG TBPK tablet        04/02/22 0237    oxyCODONE-acetaminophen (PERCOCET/ROXICET) 5-325 MG tablet  Every 6 hours PRN        04/02/22 0237              Merryl Hacker, MD 04/02/22 [phone]

## 2022-04-02 NOTE — Discharge Instructions (Signed)
You are seen today for hand pain and leg pain.  Start Medrol Dosepak for likely gout.  You will be given a short course of Percocet.  If you note redness increased swelling or any new or worsening symptoms, you should be reevaluated.  Make sure that you are taking your blood pressure medication.

## 2022-04-13 ENCOUNTER — Ambulatory Visit: Payer: BC Managed Care – PPO | Admitting: Family

## 2022-04-24 ENCOUNTER — Emergency Department (HOSPITAL_BASED_OUTPATIENT_CLINIC_OR_DEPARTMENT_OTHER)
Admission: EM | Admit: 2022-04-24 | Discharge: 2022-04-24 | Disposition: A | Discharge status: Home / Self Care | Payer: BC Managed Care – PPO | Attending: Emergency Medicine | Admitting: Emergency Medicine

## 2022-04-24 ENCOUNTER — Other Ambulatory Visit: Payer: Self-pay

## 2022-04-24 ENCOUNTER — Encounter (HOSPITAL_BASED_OUTPATIENT_CLINIC_OR_DEPARTMENT_OTHER): Payer: Self-pay | Admitting: Emergency Medicine

## 2022-04-24 DIAGNOSIS — E1122 Type 2 diabetes mellitus with diabetic chronic kidney disease: Secondary | ICD-10-CM | POA: Insufficient documentation

## 2022-04-24 DIAGNOSIS — I10 Essential (primary) hypertension: Secondary | ICD-10-CM | POA: Diagnosis not present

## 2022-04-24 DIAGNOSIS — R030 Elevated blood-pressure reading, without diagnosis of hypertension: Secondary | ICD-10-CM

## 2022-04-24 DIAGNOSIS — M62838 Other muscle spasm: Secondary | ICD-10-CM | POA: Diagnosis not present

## 2022-04-24 DIAGNOSIS — M109 Gout, unspecified: Secondary | ICD-10-CM

## 2022-04-24 DIAGNOSIS — Z794 Long term (current) use of insulin: Secondary | ICD-10-CM | POA: Insufficient documentation

## 2022-04-24 DIAGNOSIS — I129 Hypertensive chronic kidney disease with stage 1 through stage 4 chronic kidney disease, or unspecified chronic kidney disease: Secondary | ICD-10-CM | POA: Diagnosis not present

## 2022-04-24 DIAGNOSIS — N189 Chronic kidney disease, unspecified: Secondary | ICD-10-CM | POA: Diagnosis not present

## 2022-04-24 DIAGNOSIS — M10041 Idiopathic gout, right hand: Secondary | ICD-10-CM | POA: Diagnosis not present

## 2022-04-24 DIAGNOSIS — M5412 Radiculopathy, cervical region: Secondary | ICD-10-CM | POA: Diagnosis not present

## 2022-04-24 DIAGNOSIS — Z79899 Other long term (current) drug therapy: Secondary | ICD-10-CM | POA: Insufficient documentation

## 2022-04-24 DIAGNOSIS — M10042 Idiopathic gout, left hand: Secondary | ICD-10-CM | POA: Diagnosis not present

## 2022-04-24 MED ORDER — OXYCODONE-ACETAMINOPHEN 5-325 MG PO TABS: 1.0000 | ORAL_TABLET | ORAL | 0 refills | Status: DC | PRN

## 2022-04-24 MED ORDER — METHYLPREDNISOLONE 4 MG PO TBPK: ORAL_TABLET | ORAL | 0 refills | Status: DC

## 2022-04-24 MED ORDER — METOPROLOL TARTRATE 25 MG PO TABS: 25.0000 mg | ORAL_TABLET | Freq: Every day | ORAL | 0 refills | Status: DC

## 2022-04-24 MED ORDER — CYCLOBENZAPRINE HCL 10 MG PO TABS: 10.0000 mg | ORAL_TABLET | Freq: Two times a day (BID) | ORAL | 0 refills | Status: DC | PRN

## 2022-04-24 NOTE — ED Notes (Signed)
Dc instructions reviewed with patient. Patient voiced understanding. Dc with belongings.  °

## 2022-04-24 NOTE — ED Notes (Signed)
Provider at bedside

## 2022-04-24 NOTE — ED Provider Notes (Signed)
[facility] Provider Note   CSN: [id number] Arrival date & time: 04/24/22  1612     History  Chief Complaint  Patient presents with   Gout    Patrick [name] is a 57 y.o. male.  The history is provided by the patient and medical records. No language interpreter was used.  Hand Pain This is a recurrent problem. The current episode started more than 2 days ago. The problem occurs constantly. The problem has not changed since onset.Pertinent negatives include no chest pain, no abdominal pain, no headaches and no shortness of breath. Nothing aggravates the symptoms. Nothing relieves the symptoms. He has tried nothing for the symptoms. The treatment provided no relief.       Home Medications Prior to Admission medications   Medication Sig Start Date End Date Taking? Authorizing Provider  allopurinol (ZYLOPRIM) 300 MG tablet Take 0.5 tablets (150 mg total) by mouth 2 (two) times daily. 01/11/22 04/11/22  [place] Donnamarie Poag, DO  amLODipine (NORVASC) 5 MG tablet Take 1 tablet (5 mg total) by mouth daily. 03/26/22 04/25/22  Regan Lemming, MD  blood glucose meter kit and supplies KIT Dispense based on patient and insurance preference. Use up to four times daily as directed. 01/05/22   Little Ishikawa, MD  colchicine 0.6 MG tablet Take 2 tablets (1.2 mg) during a gout flareup, then take 1 tablet an hour later. Patient taking differently: Take 0.6-1.2 mg by mouth See admin instructions. Take 2 tablets (1.2 mg) during a gout flareup, then take 1 tablet (0.6 mg) an hour later. 12/16/21   Veryl Speak, MD  gabapentin (NEURONTIN) 300 MG capsule Take 1 capsule (300 mg total) by mouth 3 (three) times daily. Patient taking differently: Take 300 mg by mouth daily as needed (pain). 09/08/20   Angiulli, Lavon Paganini, PA-C  insulin aspart (NOVOLOG FLEXPEN) 100 UNIT/ML FlexPen Inject 4 Units into the skin 3 (three) times daily with meals. Patient taking differently:  Inject 4 Units into the skin 2 (two) times daily. 09/08/20   Angiulli, Lavon Paganini, PA-C  insulin glargine (LANTUS SOLOSTAR) 100 UNIT/ML Solostar Pen Inject 25 Units into the skin 2 (two) times daily. Patient taking differently: Inject 15 Units into the skin 2 (two) times daily. 09/08/20   Angiulli, Lavon Paganini, PA-C  Insulin Pen Needle 32G X 4 MM MISC Use as directed 09/08/20   [name], Clide Deutscher, MD  Insulin Pen Needle 32G X 4 MM MISC Use as directed 09/09/20   Angiulli, Lavon Paganini, PA-C  JARDIANCE 25 MG TABS tablet Take 25 mg by mouth daily. Patient not taking: Reported on 01/10/2022 09/28/21   [provider]  lidocaine (LIDODERM) 5 % Place 1 patch onto the skin daily. Remove & Discard patch within 12 hours or as directed by MD 01/15/22   Dion Saucier A, PA  methylPREDNISolone (MEDROL DOSEPAK) 4 MG TBPK tablet Take as directed on packet 04/02/22   Horton, Barbette Hair, MD  metoprolol tartrate (LOPRESSOR) 25 MG tablet Take 1 tablet (25 mg total) by mouth daily. 03/26/22 04/25/22  Regan Lemming, MD  niMODipine (NIMOTOP) 30 MG capsule Take 2 capsules (60 mg total) by mouth every 4 (four) hours for 20 days. 01/05/22 01/25/22  Little Ishikawa, MD  oxyCODONE (ROXICODONE) 5 MG immediate release tablet Take 1 tablet (5 mg total) by mouth every 8 (eight) hours as needed for moderate pain. 01/11/22 01/11/23  [place] [name], DO  oxyCODONE-acetaminophen (PERCOCET/ROXICET) 5-325 MG tablet Take 1  tablet by mouth every 6 (six) hours as needed for severe pain. 04/02/22   Horton, Barbette Hair, MD  OZEMPIC, 1 MG/DOSE, 4 MG/3ML SOPN Inject 1 mg into the skin once a week. 01/01/22   [provider]  simvastatin (ZOCOR) 10 MG tablet Take 10 mg by mouth at bedtime. 12/08/21   [provider]  tiZANidine (ZANAFLEX) 4 MG tablet Take 1 tablet (4 mg total) by mouth 2 (two) times daily as needed for muscle spasms. 09/29/20   Lavina Hamman, MD      Allergies    Patient has no known allergies.    Review of  Systems   Review of Systems  Constitutional:  Negative for chills, fatigue and fever.  HENT:  Negative for congestion.   Eyes:  Negative for visual disturbance.  Respiratory:  Negative for cough, chest tightness and shortness of breath.   Cardiovascular:  Negative for chest pain.  Gastrointestinal:  Negative for abdominal pain, constipation, diarrhea, nausea and vomiting.  Genitourinary:  Negative for flank pain.  Musculoskeletal:  Positive for neck pain. Negative for back pain and neck stiffness.  Skin:  Negative for color change, rash and wound.  Neurological:  Negative for light-headedness and headaches.  Psychiatric/Behavioral:  Negative for agitation and confusion.   All other systems reviewed and are negative.   Physical Exam Updated Vital Signs BP (!) 237/141 (BP Location: Left Arm)   Pulse 94   Temp 98.1 F (36.7 C) (Oral)   Resp 19   SpO2 100%  Physical Exam Vitals and nursing note reviewed.  Constitutional:      General: He is not in acute distress.    Appearance: He is well-developed. He is not ill-appearing, toxic-appearing or diaphoretic.  HENT:     Head: Normocephalic and atraumatic.     Nose: Nose normal. No congestion or rhinorrhea.     Mouth/Throat:     Mouth: Mucous membranes are moist.     Pharynx: No oropharyngeal exudate or posterior oropharyngeal erythema.  Eyes:     Extraocular Movements: Extraocular movements intact.     Conjunctiva/sclera: Conjunctivae normal.     Pupils: Pupils are equal, round, and reactive to light.  Cardiovascular:     Rate and Rhythm: Normal rate and regular rhythm.     Heart sounds: No murmur heard. Pulmonary:     Effort: Pulmonary effort is normal. No respiratory distress.     Breath sounds: Normal breath sounds. No wheezing, rhonchi or rales.  Chest:     Chest wall: No tenderness.  Abdominal:     General: Abdomen is flat.     Palpations: Abdomen is soft.     Tenderness: There is no abdominal tenderness. There is no  right CVA tenderness, left CVA tenderness, guarding or rebound.  Musculoskeletal:        General: Swelling and tenderness present.     Cervical back: Neck supple. Tenderness present.     Right lower leg: No edema.     Left lower leg: No edema.  Skin:    General: Skin is warm and dry.     Capillary Refill: Capillary refill takes less than 2 seconds.     Findings: No erythema or rash.  Neurological:     General: No focal deficit present.     Mental Status: He is alert.     Sensory: No sensory deficit.     Motor: No weakness.  Psychiatric:        Mood and Affect: Mood normal.  ED Results / Procedures / Treatments   Labs (all labs ordered are listed, but only abnormal results are displayed) Labs Reviewed - No data to display  EKG None  Radiology No results found.  Procedures Procedures    Medications Ordered in ED Medications - No data to display  ED Course/ Medical Decision Making/ A&P                             Medical Decision Making   KASE MUETH is a 57 y.o. male with a past medical history significant for CKD, diabetes, hypertension, recurrent gout, bilateral BKA, and for vascular disease who presents with 1 week of being out of blood pressure medicine with elevated blood pressures, bilateral hand right worse than left pain consistent with previous gout flare, and recurrent bilateral paraspinal neck muscle spasm.  Patient reports after last week or so he has been out of his blood pressure medicine and it has been higher.  He is seeing a new primary doctor next week to get his medication sorted out.  He says he also has had development of gout flare in his bilateral hands right worse than left that is consistent with prior.  He typically gets a Medrol Dosepak and some pain medicine and does well.  He says he needs to get his gout medications changed by his new PCP.  He says that he is also had some muscle spasm in his neck recently but no fevers chills or trauma.   No headache.  No neurologic complaints.  Reports he did well with muscle relaxant in the past.  He otherwise denies any chest pain, shortness of breath, nausea, vomiting, constipation, diarrhea, or urinary changes.  No skin changes or concern for cellulitis or infection at this time.  On exam, lungs clear.  Chest nontender.  Abdomen nontender.  Patient has tenderness in his hands bilaterally with some swelling and right worse than left but has intact pulses, strength, and sensation.  He reports that is completely consistent with his previous gout flares.  He had paraspinal neck tenderness but no midline tenderness.  No focal neurologic deficits.  Patient otherwise well-appearing.  Bilateral BKA.  We had a shared decision-making conversation offering lab workup and imaging however given his similarity to prior, patient was referred to get prescriptions and take them at home.  He is requesting pain medicine, Medrol Dosepak, blood pressure medication refill, and some muscle relaxants.  Given his similar to prior and his plan to see his PCP in the neck several days, this was felt to be reasonable.  Patient will be given these prescriptions and follow-up with his primary doctor.  He understands return precautions.  We discussed if symptoms change or worsen he is to return for more extensive lab and imaging workup but patient agrees with discharge at this time.  Patient discharged in good condition.         Final Clinical Impression(s) / ED Diagnoses Final diagnoses:  Acute gout of hand, unspecified cause, unspecified laterality  Muscle spasm  Elevated blood pressure reading    Rx / DC Orders ED Discharge Orders          Ordered    cyclobenzaprine (FLEXERIL) 10 MG tablet  2 times daily PRN        04/24/22 1710    methylPREDNISolone (MEDROL DOSEPAK) 4 MG TBPK tablet        04/24/22 1710    oxyCODONE-acetaminophen (PERCOCET/ROXICET)  5-325 MG tablet  Every 4 hours PRN        04/24/22 1710     metoprolol tartrate (LOPRESSOR) 25 MG tablet  Daily        04/24/22 1710            Clinical Impression: 1. Acute gout of hand, unspecified cause, unspecified laterality   2. Muscle spasm   3. Elevated blood pressure reading     Disposition: Discharge  Condition: Good  I have discussed the results, Dx and Tx plan with the pt(& family if present). He/she/they expressed understanding and agree(s) with the plan. Discharge instructions discussed at great length. Strict return precautions discussed and pt &/or family have verbalized understanding of the instructions. No further questions at time of discharge.    New Prescriptions   CYCLOBENZAPRINE (FLEXERIL) 10 MG TABLET    Take 1 tablet (10 mg total) by mouth 2 (two) times daily as needed for muscle spasms.   METHYLPREDNISOLONE (MEDROL DOSEPAK) 4 MG TBPK TABLET    Please follow instructions on dosepak.   METOPROLOL TARTRATE (LOPRESSOR) 25 MG TABLET    Take 1 tablet (25 mg total) by mouth daily.   OXYCODONE-ACETAMINOPHEN (PERCOCET/ROXICET) 5-325 MG TABLET    Take 1 tablet by mouth every 4 (four) hours as needed for severe pain.    Follow Up: No follow-up provider specified.    Noelani Harbach, Gwenyth Allegra, MD 04/24/22 1710

## 2022-04-24 NOTE — ED Triage Notes (Signed)
Pt reports gout flare up in his hands. Pt also reports neck spasms.

## 2022-04-24 NOTE — Discharge Instructions (Signed)
Your history, exam, workup today are consistent with recurrent gout flare, recurrent muscle spasms, and elevated blood pressures as you have been out of your blood pressure medicine.  We had a long shared decision-making conversation and agreed to hold on extensive workup and instead refills on these medications he can see your primary doctor in the next several days.  Please take the blood pressure medicine as you had and use the medicines to help with the gout flare.  Please use the muscle relaxants like you had before for the muscle spasms.  If any symptoms were to change or worsen or you start developing symptoms of infection, please return to the nearest emergency department.  Please rest and stay hydrated.

## 2022-04-27 ENCOUNTER — Encounter: Payer: Self-pay | Admitting: Family Medicine

## 2022-04-27 ENCOUNTER — Ambulatory Visit (INDEPENDENT_AMBULATORY_CARE_PROVIDER_SITE_OTHER): Payer: BC Managed Care – PPO | Admitting: Family Medicine

## 2022-04-27 VITALS — BP 174/96 | HR 78 | Temp 98.5°F | Wt 330.2 lb

## 2022-04-27 DIAGNOSIS — R252 Cramp and spasm: Secondary | ICD-10-CM

## 2022-04-27 DIAGNOSIS — E1165 Type 2 diabetes mellitus with hyperglycemia: Secondary | ICD-10-CM | POA: Diagnosis not present

## 2022-04-27 DIAGNOSIS — I10 Essential (primary) hypertension: Secondary | ICD-10-CM

## 2022-04-27 DIAGNOSIS — M1A09X0 Idiopathic chronic gout, multiple sites, without tophus (tophi): Secondary | ICD-10-CM

## 2022-04-27 DIAGNOSIS — E7800 Pure hypercholesterolemia, unspecified: Secondary | ICD-10-CM

## 2022-04-27 DIAGNOSIS — N1832 Chronic kidney disease, stage 3b: Secondary | ICD-10-CM

## 2022-04-27 DIAGNOSIS — Z794 Long term (current) use of insulin: Secondary | ICD-10-CM

## 2022-04-27 MED ORDER — AMLODIPINE BESYLATE 5 MG PO TABS: 5.0000 mg | ORAL_TABLET | Freq: Every day | ORAL | 0 refills | Status: DC

## 2022-04-27 MED ORDER — ALLOPURINOL 100 MG PO TABS: 100.0000 mg | ORAL_TABLET | Freq: Two times a day (BID) | ORAL | 2 refills | Status: DC

## 2022-04-27 MED ORDER — JARDIANCE 25 MG PO TABS: 25.0000 mg | ORAL_TABLET | Freq: Every day | ORAL | 2 refills | Status: DC

## 2022-04-27 MED ORDER — LANTUS SOLOSTAR 100 UNIT/ML ~~LOC~~ SOPN: 25.0000 [IU] | PEN_INJECTOR | Freq: Every day | SUBCUTANEOUS | 11 refills | Status: DC

## 2022-04-27 MED ORDER — SIMVASTATIN 10 MG PO TABS: 10.0000 mg | ORAL_TABLET | Freq: Every day | ORAL | 3 refills | Status: DC

## 2022-04-27 MED ORDER — METOPROLOL TARTRATE 25 MG PO TABS: 25.0000 mg | ORAL_TABLET | Freq: Two times a day (BID) | ORAL | 3 refills | Status: DC

## 2022-04-27 MED ORDER — COLCHICINE 0.6 MG PO TABS: ORAL_TABLET | ORAL | 2 refills | Status: DC

## 2022-04-27 MED ORDER — LANTUS SOLOSTAR 100 UNIT/ML ~~LOC~~ SOPN: 25.0000 [IU] | PEN_INJECTOR | Freq: Two times a day (BID) | SUBCUTANEOUS | 11 refills | Status: DC

## 2022-04-27 MED ORDER — TIZANIDINE HCL 4 MG PO TABS: 4.0000 mg | ORAL_TABLET | Freq: Two times a day (BID) | ORAL | 0 refills | Status: DC | PRN

## 2022-04-27 NOTE — Progress Notes (Signed)
New Patient Office Visit  Subjective    Patient ID: Patrick [name], male    DOB: 03/11/1965  Age: 57 y.o. MRN: [id number]  CC:  Chief Complaint  Patient presents with   Establish Care    Would like to discuss muscle cramps from back of ear down to shoulders. Discuss Blood sugars and a meter. Requesting colonoscopy from [facility] presents to establish care. Encounter Diagnoses  Name Primary?   Uncontrolled type 2 diabetes mellitus with hyperglycemia, with long-term current use of insulin (HCC) Yes   Chronic gout of multiple sites, unspecified cause    Stage 3b chronic kidney disease (HCC)    Elevated cholesterol    Uncontrolled hypertension    Muscle cramps    For establishment of care.  This gentleman has a lot of health challenges including type 2 diabetes with bilateral BKA's, chronic gout, CKD, uncontrolled hypertension, elevated cholesterol and chronic muscle spasms.  He had developed hypoglycemia in the recent past and has since discontinued all of his diabetes medicines.  However recent recent hemoglobin A1c in November of this past year was 6.7.  History of chronic gout treated with allopurinol 150 twice daily and colchicine as needed.  He has been taking his allopurinol intermittently.  History of CKD.  He is also being followed by nephrology.  He has an appointment in a few weeks.  He has not been taking his blood pressure medicines.  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   Outpatient Encounter Medications as of 04/27/2022  Medication Sig   allopurinol (ZYLOPRIM) 100 MG tablet Take 1 tablet (100 mg total) by mouth 2 (two) times daily.   blood glucose meter kit and supplies KIT Dispense based on patient and insurance preference. Use up to four times daily as directed.   gabapentin (NEURONTIN) 300 MG capsule Take 1 capsule (300 mg total) by mouth 3 (three) times daily. (Patient taking differently: Take 300 mg by mouth daily as needed (pain).)   Insulin Pen Needle  32G X 4 MM MISC Use as directed   Insulin Pen Needle 32G X 4 MM MISC Use as directed   oxyCODONE (ROXICODONE) 5 MG immediate release tablet Take 1 tablet (5 mg total) by mouth every 8 (eight) hours as needed for moderate pain.   oxyCODONE-acetaminophen (PERCOCET/ROXICET) 5-325 MG tablet Take 1 tablet by mouth every 6 (six) hours as needed for severe pain.   oxyCODONE-acetaminophen (PERCOCET/ROXICET) 5-325 MG tablet Take 1 tablet by mouth every 4 (four) hours as needed for severe pain.   OZEMPIC, 1 MG/DOSE, 4 MG/3ML SOPN Inject 1 mg into the skin once a week.   [DISCONTINUED] colchicine 0.6 MG tablet Take 2 tablets (1.2 mg) during a gout flareup, then take 1 tablet an hour later.   [DISCONTINUED] cyclobenzaprine (FLEXERIL) 10 MG tablet Take 1 tablet (10 mg total) by mouth 2 (two) times daily as needed for muscle spasms.   [DISCONTINUED] metoprolol tartrate (LOPRESSOR) 25 MG tablet Take 1 tablet (25 mg total) by mouth daily.   [DISCONTINUED] simvastatin (ZOCOR) 10 MG tablet Take 10 mg by mouth at bedtime.   [DISCONTINUED] tiZANidine (ZANAFLEX) 4 MG tablet Take 1 tablet (4 mg total) by mouth 2 (two) times daily as needed for muscle spasms.   amLODipine (NORVASC) 5 MG tablet Take 1 tablet (5 mg total) by mouth daily.   colchicine 0.6 MG tablet Take 2 tablets (1.2 mg) during a gout flareup, then take 1 tablet an hour later.  insulin aspart (NOVOLOG FLEXPEN) 100 UNIT/ML FlexPen Inject 4 Units into the skin 3 (three) times daily with meals. (Patient not taking: Reported on 04/27/2022)   insulin glargine (LANTUS SOLOSTAR) 100 UNIT/ML Solostar Pen Inject 25 Units into the skin at bedtime.   JARDIANCE 25 MG TABS tablet Take 1 tablet (25 mg total) by mouth daily.   lidocaine (LIDODERM) 5 % Place 1 patch onto the skin daily. Remove & Discard patch within 12 hours or as directed by MD (Patient not taking: Reported on 04/27/2022)   methylPREDNISolone (MEDROL DOSEPAK) 4 MG TBPK tablet Take as directed on packet  (Patient not taking: Reported on 04/27/2022)   methylPREDNISolone (MEDROL DOSEPAK) 4 MG TBPK tablet Please follow instructions on dosepak. (Patient not taking: Reported on 04/27/2022)   metoprolol tartrate (LOPRESSOR) 25 MG tablet Take 1 tablet (25 mg total) by mouth 2 (two) times daily.   simvastatin (ZOCOR) 10 MG tablet Take 1 tablet (10 mg total) by mouth at bedtime.   tiZANidine (ZANAFLEX) 4 MG tablet Take 1 tablet (4 mg total) by mouth 2 (two) times daily as needed for muscle spasms.   [DISCONTINUED] allopurinol (ZYLOPRIM) 300 MG tablet Take 0.5 tablets (150 mg total) by mouth 2 (two) times daily.   [DISCONTINUED] amLODipine (NORVASC) 5 MG tablet Take 1 tablet (5 mg total) by mouth daily.   [DISCONTINUED] insulin glargine (LANTUS SOLOSTAR) 100 UNIT/ML Solostar Pen Inject 25 Units into the skin 2 (two) times daily. (Patient not taking: Reported on 04/27/2022)   [DISCONTINUED] insulin glargine (LANTUS SOLOSTAR) 100 UNIT/ML Solostar Pen Inject 25 Units into the skin 2 (two) times daily.   [DISCONTINUED] JARDIANCE 25 MG TABS tablet Take 25 mg by mouth daily. (Patient not taking: Reported on 01/10/2022)   [DISCONTINUED] metoprolol tartrate (LOPRESSOR) 25 MG tablet Take 1 tablet (25 mg total) by mouth daily.   [DISCONTINUED] niMODipine (NIMOTOP) 30 MG capsule Take 2 capsules (60 mg total) by mouth every 4 (four) hours for 20 days.   Facility-Administered Encounter Medications as of 04/27/2022  Medication   ketamine (KETALAR) bolus via infusion 68.05 mg    Past Medical History:  Diagnosis Date   Anemia    Diabetes mellitus    Gout    History of COVID-19 04/2019   Hypertension    PVD (peripheral vascular disease) ([facility])    s/p R BKA    Past Surgical History:  Procedure Laterality Date   AMPUTATION Right 06/06/2020   Procedure: AMPUTATION 5TH RAY;  Surgeon: [name], MD;  Location: [facility];  Service: Orthopedics;  Laterality: Right;   AMPUTATION Right 06/08/2020   Procedure: RIGHT 4TH RAY  AMPUTATION;  Surgeon: [name], MD;  Location: [facility];  Service: Orthopedics;  Laterality: Right;   AMPUTATION Right 08/27/2020   Procedure: AMPUTATION BELOW KNEE;  Surgeon: Jessy Oto, MD;  Location: [facility];  Service: Orthopedics;  Laterality: Right;   AMPUTATION Left 12/30/2020   Procedure: LEFT BELOW KNEE AMPUTATION;  Surgeon: [name], MD;  Location: [facility];  Service: Orthopedics;  Laterality: Left;   CHOLECYSTECTOMY N/A 09/25/2020   Procedure: LAPAROSCOPIC CHOLECYSTECTOMY;  Surgeon: Jesusita Oka, MD;  Location: [facility];  Service: General;  Laterality: N/A;    Family History  Problem Relation Age of Onset   Diabetes Neg Hx     Social History   Socioeconomic History   Marital status: Divorced    Spouse name: Not on file   Number of children: Not on file   Years of  education: Not on file   Highest education level: Not on file  Occupational History   Occupation: logistics  Tobacco Use   Smoking status: Never    Passive exposure: Never   Smokeless tobacco: Never  Vaping Use   Vaping Use: Never used  Substance and Sexual Activity   Alcohol use: No   Drug use: No   Sexual activity: Not Currently  Other Topics Concern   Not on file  Social History Narrative   Not on file   Social Determinants of Health   Financial Resource Strain: Not on file  Food Insecurity: No Food Insecurity (01/11/2022)   Hunger Vital Sign    Worried About Running Out of Food in the Last Year: Never true    Ran Out of Food in the Last Year: Never true  Transportation Needs: No Transportation Needs (01/11/2022)   PRAPARE - Hydrologist (Medical): No    Lack of Transportation (Non-Medical): No  Physical Activity: Not on file  Stress: Not on file  Social Connections: Not on file  Intimate Partner Violence: Not At Risk (01/11/2022)   Humiliation, Afraid, Rape, and Kick questionnaire    Fear of Current or Ex-Partner: No    Emotionally Abused: No     Physically Abused: No    Sexually Abused: No    Review of Systems  Constitutional: Negative.   HENT: Negative.    Eyes:  Negative for blurred vision, discharge and redness.  Respiratory: Negative.    Cardiovascular: Negative.   Gastrointestinal:  Negative for abdominal pain.  Genitourinary: Negative.   Musculoskeletal:  Positive for joint pain. Negative for myalgias.  Skin:  Negative for rash.  Neurological:  Negative for tingling, loss of consciousness and weakness.  Endo/Heme/Allergies:  Negative for polydipsia.      04/27/2022    2:21 PM  Depression screen PHQ 2/9  Decreased Interest 0  Down, Depressed, Hopeless 0  PHQ - 2 Score 0          Objective    BP (!) 180/100 (BP Location: Right Arm, Patient Position: Sitting, Cuff Size: Large)   Pulse 78   Temp 98.5 F (36.9 C) (Temporal)   Wt (!) 330 lb 3.2 oz (149.8 kg)   SpO2 99%   BMI 41.27 kg/m   Physical Exam Constitutional:      General: He is not in acute distress.    Appearance: Normal appearance. He is not ill-appearing, toxic-appearing or diaphoretic.  HENT:     Head: Normocephalic and atraumatic.     Right Ear: External ear normal.     Left Ear: External ear normal.  Eyes:     General: No scleral icterus.       Right eye: No discharge.        Left eye: No discharge.     Extraocular Movements: Extraocular movements intact.     Conjunctiva/sclera: Conjunctivae normal.  Cardiovascular:     Rate and Rhythm: Normal rate and regular rhythm.  Pulmonary:     Effort: Pulmonary effort is normal. No respiratory distress.     Breath sounds: Normal breath sounds.  Skin:    General: Skin is warm and dry.  Neurological:     Mental Status: He is alert and oriented to person, place, and time.  Psychiatric:        Mood and Affect: Mood normal.        Behavior: Behavior normal.         Assessment &  Plan:   Uncontrolled type 2 diabetes mellitus with hyperglycemia, with long-term current use of insulin  ([facility]) -     Jardiance; Take 1 tablet (25 mg total) by mouth daily.  Dispense: 30 tablet; Refill: 2 -     Lantus SoloStar; Inject 25 Units into the skin at bedtime.  Dispense: 15 mL; Refill: 11  Chronic gout of multiple sites, unspecified cause -     Colchicine; Take 2 tablets (1.2 mg) during a gout flareup, then take 1 tablet an hour later.  Dispense: 30 tablet; Refill: 2 -     Allopurinol; Take 1 tablet (100 mg total) by mouth 2 (two) times daily.  Dispense: 60 tablet; Refill: 2  Stage 3b chronic kidney disease (HCC)  Elevated cholesterol -     Simvastatin; Take 1 tablet (10 mg total) by mouth at bedtime.  Dispense: 90 tablet; Refill: 3  Uncontrolled hypertension -     Metoprolol Tartrate; Take 1 tablet (25 mg total) by mouth 2 (two) times daily.  Dispense: 60 tablet; Refill: 3 -     amLODIPine Besylate; Take 1 tablet (5 mg total) by mouth daily.  Dispense: 90 tablet; Refill: 0  Muscle cramps -     tiZANidine HCl; Take 1 tablet (4 mg total) by mouth 2 (two) times daily as needed for muscle spasms.  Dispense: 10 tablet; Refill: 0     Return in about 2 weeks (around 05/11/2022).  Restart Lantus 25 units at bedtime only and Jardiance 25 mg daily.  Restart metoprolol tartrate '25mg'$  twice daily and amlodipine 5 mg daily.Restart allopurinol at a lower dose of 100 mg twice daily.  Renal may adjust downward.  Information was given on a low purine diet.  Libby Maw, MD

## 2022-05-11 ENCOUNTER — Ambulatory Visit: Payer: BC Managed Care – PPO | Admitting: Family Medicine

## 2022-05-11 ENCOUNTER — Telehealth: Payer: Self-pay | Admitting: Family Medicine

## 2022-05-11 NOTE — Telephone Encounter (Signed)
Pt NS appt called at at 1;15 to cancel his 1:20 appt.

## 2022-05-14 NOTE — Telephone Encounter (Signed)
1st no show, fee waived, letter sent to reschedule/notify of policy 

## 2022-05-14 NOTE — Telephone Encounter (Signed)
He forgot about his appt.

## 2022-05-15 ENCOUNTER — Ambulatory Visit: Payer: BC Managed Care – PPO | Admitting: Family Medicine

## 2022-05-15 ENCOUNTER — Telehealth: Payer: Self-pay | Admitting: Family Medicine

## 2022-05-15 NOTE — Telephone Encounter (Signed)
Pt was a no show for an OV with Dr Ethelene Hal on 05/15/22, I sent a no show letter.

## 2022-05-24 NOTE — Telephone Encounter (Signed)
3/22 pt called 1:15p for 1:20p appt and said he forgot and he was at work 3/26 no show  Fee generated for 3/26, final warning letter sent via mail and [employer]

## 2022-06-22 ENCOUNTER — Emergency Department (HOSPITAL_BASED_OUTPATIENT_CLINIC_OR_DEPARTMENT_OTHER): Payer: Medicaid Other

## 2022-06-22 ENCOUNTER — Other Ambulatory Visit: Payer: Self-pay

## 2022-06-22 ENCOUNTER — Emergency Department (HOSPITAL_BASED_OUTPATIENT_CLINIC_OR_DEPARTMENT_OTHER)
Admission: EM | Admit: 2022-06-22 | Discharge: 2022-06-22 | Disposition: A | Discharge status: Home / Self Care | Payer: Medicaid Other | Attending: Emergency Medicine | Admitting: Emergency Medicine

## 2022-06-22 DIAGNOSIS — M109 Gout, unspecified: Secondary | ICD-10-CM | POA: Insufficient documentation

## 2022-06-22 DIAGNOSIS — M1A09X0 Idiopathic chronic gout, multiple sites, without tophus (tophi): Secondary | ICD-10-CM

## 2022-06-22 DIAGNOSIS — M79642 Pain in left hand: Secondary | ICD-10-CM | POA: Diagnosis present

## 2022-06-22 DIAGNOSIS — Z794 Long term (current) use of insulin: Secondary | ICD-10-CM | POA: Diagnosis not present

## 2022-06-22 MED ORDER — COLCHICINE 0.6 MG PO TABS
ORAL_TABLET | ORAL | 2 refills | Status: DC
Start: 2022-06-22 — End: 2022-06-30

## 2022-06-22 MED ORDER — METHYLPREDNISOLONE 4 MG PO TBPK: ORAL_TABLET | ORAL | 0 refills | Status: DC

## 2022-06-22 MED ORDER — ACETAMINOPHEN 500 MG PO TABS
1000.0000 mg | ORAL_TABLET | Freq: Once | ORAL | Status: AC
  Administered 2022-06-22: 1000 mg via ORAL
  Filled 2022-06-22: qty 2

## 2022-06-22 MED ORDER — PREDNISONE 50 MG PO TABS
60.0000 mg | ORAL_TABLET | Freq: Once | ORAL | Status: AC
  Administered 2022-06-22: 60 mg via ORAL
  Filled 2022-06-22: qty 1

## 2022-06-22 MED ORDER — OXYCODONE HCL 5 MG PO TABS
5.0000 mg | ORAL_TABLET | Freq: Once | ORAL | Status: AC
  Administered 2022-06-22: 5 mg via ORAL
  Filled 2022-06-22: qty 1

## 2022-06-22 MED ORDER — KETOROLAC TROMETHAMINE 15 MG/ML IJ SOLN
15.0000 mg | Freq: Once | INTRAMUSCULAR | Status: AC
  Administered 2022-06-22: 15 mg via INTRAMUSCULAR
  Filled 2022-06-22: qty 1

## 2022-06-22 NOTE — Discharge Instructions (Signed)
Follow up with your doctor in the office.  

## 2022-06-22 NOTE — ED Provider Notes (Signed)
[place] EMERGENCY DEPARTMENT AT [facility] Provider Note   CSN: [id number] Arrival date & time: 06/22/22  1722     History  Chief Complaint  Patient presents with   Gout   Hand Pain    left    Patrick [name] is a 57 y.o. male.  57 yo M with a chief complaint of left hand and wrist pain.  Going on for a few days.  Has a history of gout thinks this feels the same.  No fevers or chills.  Denies trauma.  Nothing abnormal from his typical gout flare.  Tried his home medication without improvement.   Hand Pain       Home Medications Prior to Admission medications   Medication Sig Start Date End Date Taking? Authorizing Provider  methylPREDNISolone (MEDROL DOSEPAK) 4 MG TBPK tablet Day 1: 8mg  before breakfast, 4 mg after lunch, 4 mg after supper, and 8 mg at bedtime Day 2: 4 mg before breakfast, 4 mg after lunch, 4 mg  after supper, and 8 mg  at bedtime Day 3:  4 mg  before breakfast, 4 mg  after lunch, 4 mg after supper, and 4 mg  at bedtime Day 4: 4 mg  before breakfast, 4 mg  after lunch, and 4 mg at bedtime Day 5: 4 mg  before breakfast and 4 mg at bedtime Day 6: 4 mg  before breakfast 06/22/22  Yes Melene Plan, DO  allopurinol (ZYLOPRIM) 100 MG tablet Take 1 tablet (100 mg total) by mouth 2 (two) times daily. 04/27/22   [name], MD  amLODipine (NORVASC) 5 MG tablet Take 1 tablet (5 mg total) by mouth daily. 04/27/22 07/26/22  [name], MD  blood glucose meter kit and supplies KIT Dispense based on patient and insurance preference. Use up to four times daily as directed. 01/05/22   [name], MD  colchicine 0.6 MG tablet Take 2 tablets (1.2 mg) during a gout flareup, then take 1 tablet an hour later. 06/22/22   Melene Plan, DO  gabapentin (NEURONTIN) 300 MG capsule Take 1 capsule (300 mg total) by mouth 3 (three) times daily. Patient taking differently: Take 300 mg by mouth daily as needed (pain). 09/08/20   Angiulli, Mcarthur Rossetti, PA-C  insulin  aspart (NOVOLOG FLEXPEN) 100 UNIT/ML FlexPen Inject 4 Units into the skin 3 (three) times daily with meals. Patient not taking: Reported on 04/27/2022 09/08/20   Angiulli, Mcarthur Rossetti, PA-C  insulin glargine (LANTUS SOLOSTAR) 100 UNIT/ML Solostar Pen Inject 25 Units into the skin at bedtime. 04/27/22   [name], MD  Insulin Pen Needle 32G X 4 MM MISC Use as directed 09/08/20   [name], [name], MD  Insulin Pen Needle 32G X 4 MM MISC Use as directed 09/09/20   Angiulli, Mcarthur Rossetti, PA-C  JARDIANCE 25 MG TABS tablet Take 1 tablet (25 mg total) by mouth daily. 04/27/22   [name], MD  lidocaine (LIDODERM) 5 % Place 1 patch onto the skin daily. Remove & Discard patch within 12 hours or as directed by MD Patient not taking: Reported on 04/27/2022 01/15/22   Sherian Maroon A, PA  metoprolol tartrate (LOPRESSOR) 25 MG tablet Take 1 tablet (25 mg total) by mouth 2 (two) times daily. 04/27/22   [name], MD  oxyCODONE (ROXICODONE) 5 MG immediate release tablet Take 1 tablet (5 mg total) by mouth every 8 (eight) hours as needed for moderate pain. 01/11/22 01/11/23  [name], Eric  J, DO  oxyCODONE-acetaminophen (PERCOCET/ROXICET) 5-325 MG tablet Take 1 tablet by mouth every 6 (six) hours as needed for severe pain. 04/02/22   Horton, Mayer Masker, MD  oxyCODONE-acetaminophen (PERCOCET/ROXICET) 5-325 MG tablet Take 1 tablet by mouth every 4 (four) hours as needed for severe pain. 04/24/22   Tegeler, Canary Brim, MD  OZEMPIC, 1 MG/DOSE, 4 MG/3ML SOPN Inject 1 mg into the skin once a week. 01/01/22   [provider]  simvastatin (ZOCOR) 10 MG tablet Take 1 tablet (10 mg total) by mouth at bedtime. 04/27/22   [name], MD  tiZANidine (ZANAFLEX) 4 MG tablet Take 1 tablet (4 mg total) by mouth 2 (two) times daily as needed for muscle spasms. 04/27/22   [name], MD      Allergies    Patient has no known allergies.    Review of Systems   Review of  Systems  Physical Exam Updated Vital Signs BP (!) 178/105 (BP Location: Left Arm)   Pulse (!) 101   Temp 98.6 F (37 C) (Oral)   Resp 20   Ht 6\' 3"  (1.905 m)   Wt 133.8 kg   SpO2 97%   BMI 36.87 kg/m  Physical Exam Vitals and nursing note reviewed.  Constitutional:      Appearance: He is well-developed.  HENT:     Head: Normocephalic and atraumatic.  Eyes:     Pupils: Pupils are equal, round, and reactive to light.  Neck:     Vascular: No JVD.  Cardiovascular:     Rate and Rhythm: Normal rate and regular rhythm.     Heart sounds: No murmur heard.    No friction rub. No gallop.  Pulmonary:     Effort: No respiratory distress.     Breath sounds: No wheezing.  Abdominal:     General: There is no distension.     Tenderness: There is no abdominal tenderness. There is no guarding or rebound.  Musculoskeletal:        General: Swelling and tenderness present. Normal range of motion.     Cervical back: Normal range of motion and neck supple.     Comments: Pain swelling and erythema mostly about the dorsal aspect of the left hand and wrist.  Intact pulse.  No obvious significant pain with range of motion of the wrist.  Able to supinate pronate without issue.  Skin:    Coloration: Skin is not pale.     Findings: No rash.  Neurological:     Mental Status: He is alert and oriented to person, place, and time.  Psychiatric:        Behavior: Behavior normal.     ED Results / Procedures / Treatments   Labs (all labs ordered are listed, but only abnormal results are displayed) Labs Reviewed - No data to display  EKG None  Radiology No results found.  Procedures Procedures    Medications Ordered in ED Medications  acetaminophen (TYLENOL) tablet 1,000 mg (has no administration in time range)  ketorolac (TORADOL) 15 MG/ML injection 15 mg (has no administration in time range)  oxyCODONE (Oxy IR/ROXICODONE) immediate release tablet 5 mg (has no administration in time  range)  predniSONE (DELTASONE) tablet 60 mg (has no administration in time range)    ED Course/ Medical Decision Making/ A&P                             Medical Decision  Making Risk OTC drugs. Prescription drug management.   57 yo M with a chief complaints of left hand and wrist pain.  This feels like his typical gout flare.  Exam is not consistent with septic arthritis as he is able to move his wrist without significant discomfort.  Will treat his gout flare as it has in the past.  Have him follow-up with his family doctor in the office.  8:43 PM:  I have discussed the diagnosis/risks/treatment options with the patient.  Evaluation and diagnostic testing in the emergency department does not suggest an emergent condition requiring admission or immediate intervention beyond what has been performed at this time.  They will follow up with PCP. We also discussed returning to the ED immediately if new or worsening sx occur. We discussed the sx which are most concerning (e.g., sudden worsening pain, fever, inability to tolerate by mouth) that necessitate immediate return. Medications administered to the patient during their visit and any new prescriptions provided to the patient are listed below.  Medications given during this visit Medications  acetaminophen (TYLENOL) tablet 1,000 mg (has no administration in time range)  ketorolac (TORADOL) 15 MG/ML injection 15 mg (has no administration in time range)  oxyCODONE (Oxy IR/ROXICODONE) immediate release tablet 5 mg (has no administration in time range)  predniSONE (DELTASONE) tablet 60 mg (has no administration in time range)     The patient appears reasonably screen and/or stabilized for discharge and I doubt any other medical condition or other [facility] requiring further screening, evaluation, or treatment in the ED at this time prior to discharge.          Final Clinical Impression(s) / ED Diagnoses Final diagnoses:  Acute gout of left  hand, unspecified cause    Rx / DC Orders ED Discharge Orders          Ordered    methylPREDNISolone (MEDROL DOSEPAK) 4 MG TBPK tablet        06/22/22 2032    colchicine 0.6 MG tablet        06/22/22 2032              Melene Plan, DO 06/22/22 2043

## 2022-06-22 NOTE — ED Notes (Signed)
Reviewed AVS/discharge instruction with patient. Time allotted for and all questions answered. Patient is agreeable for d/c and escorted to ed exit by staff.  

## 2022-06-22 NOTE — ED Triage Notes (Signed)
Patient arrives with complaints of left arm pain related to a gout flare up x3 days. Patient rates pain an 11/10.

## 2022-06-29 ENCOUNTER — Other Ambulatory Visit: Payer: Self-pay

## 2022-06-29 DIAGNOSIS — M109 Gout, unspecified: Secondary | ICD-10-CM | POA: Diagnosis not present

## 2022-06-30 ENCOUNTER — Other Ambulatory Visit: Payer: Self-pay

## 2022-06-30 ENCOUNTER — Emergency Department (HOSPITAL_BASED_OUTPATIENT_CLINIC_OR_DEPARTMENT_OTHER)
Admission: EM | Admit: 2022-06-30 | Discharge: 2022-06-30 | Disposition: A | Discharge status: Home / Self Care | Payer: Medicaid Other | Attending: Emergency Medicine | Admitting: Emergency Medicine

## 2022-06-30 ENCOUNTER — Encounter (HOSPITAL_BASED_OUTPATIENT_CLINIC_OR_DEPARTMENT_OTHER): Payer: Self-pay

## 2022-06-30 DIAGNOSIS — M1A09X0 Idiopathic chronic gout, multiple sites, without tophus (tophi): Secondary | ICD-10-CM

## 2022-06-30 DIAGNOSIS — M1A0420 Idiopathic chronic gout, left hand, without tophus (tophi): Secondary | ICD-10-CM

## 2022-06-30 MED ORDER — COLCHICINE 0.6 MG PO TABS
ORAL_TABLET | ORAL | 2 refills | Status: DC
Start: 2022-06-30 — End: 2022-11-10

## 2022-06-30 MED ORDER — OXYCODONE-ACETAMINOPHEN 5-325 MG PO TABS
2.0000 | ORAL_TABLET | Freq: Once | ORAL | Status: AC
  Administered 2022-06-30: 2 via ORAL
  Filled 2022-06-30: qty 2

## 2022-06-30 MED ORDER — OXYCODONE-ACETAMINOPHEN 5-325 MG PO TABS: 1.0000 | ORAL_TABLET | ORAL | 0 refills | Status: DC | PRN

## 2022-06-30 NOTE — ED Triage Notes (Signed)
Patient reports he's had a gout flare up x 2 weeks, states he was previously given a steroid which did not help, swelling and pain have increased since then. Was not given colchicine at pharmacy and cannot take indomethacin due to his kidneys.

## 2022-06-30 NOTE — ED Notes (Signed)
Reviewed AVS with patient, patient expressed understanding of directions, denies further questions at this time. 

## 2022-06-30 NOTE — ED Provider Notes (Signed)
[place] EMERGENCY DEPARTMENT AT [facility]  Provider Note  CSN: [id number] Arrival date & time: 06/29/22 2333  History Chief Complaint  Patient presents with   Gout    Patrick [name] is a 57 y.o. male with history of chronic gout with frequent flare ups particularly in his hands reports about 2 weeks of pain and swelling in L hand. Seen in the ED on 5/3 for same and given Rx for medrol which typically helps but this time was not effective. He has been given colchicine in the past but does not have any at home. He is on daily allopurinol but sometimes misses doses. He has not been seen by Rheum, does not remember how the initial diagnosis of gout was made but it was many years ago. He is s/p bilateral BKA for infection/gangrene in 2022.    Home Medications Prior to Admission medications   Medication Sig Start Date End Date Taking? Authorizing Provider  allopurinol (ZYLOPRIM) 100 MG tablet Take 1 tablet (100 mg total) by mouth 2 (two) times daily. 04/27/22   [name], MD  amLODipine (NORVASC) 5 MG tablet Take 1 tablet (5 mg total) by mouth daily. 04/27/22 07/26/22  [name], MD  blood glucose meter kit and supplies KIT Dispense based on patient and insurance preference. Use up to four times daily as directed. 01/05/22   [name], MD  colchicine 0.6 MG tablet Take 2 tablets (1.2 mg) during a gout flareup, then take 1 tablet an hour later. 06/30/22   Pollyann Savoy, MD  gabapentin (NEURONTIN) 300 MG capsule Take 1 capsule (300 mg total) by mouth 3 (three) times daily. Patient taking differently: Take 300 mg by mouth daily as needed (pain). 09/08/20   Angiulli, Mcarthur Rossetti, PA-C  insulin aspart (NOVOLOG FLEXPEN) 100 UNIT/ML FlexPen Inject 4 Units into the skin 3 (three) times daily with meals. Patient not taking: Reported on 04/27/2022 09/08/20   Angiulli, Mcarthur Rossetti, PA-C  insulin glargine (LANTUS SOLOSTAR) 100 UNIT/ML Solostar Pen Inject 25 Units into  the skin at bedtime. 04/27/22   [name], MD  Insulin Pen Needle 32G X 4 MM MISC Use as directed 09/08/20   [name], [name], MD  Insulin Pen Needle 32G X 4 MM MISC Use as directed 09/09/20   Angiulli, Mcarthur Rossetti, PA-C  JARDIANCE 25 MG TABS tablet Take 1 tablet (25 mg total) by mouth daily. 04/27/22   [name], MD  lidocaine (LIDODERM) 5 % Place 1 patch onto the skin daily. Remove & Discard patch within 12 hours or as directed by MD Patient not taking: Reported on 04/27/2022 01/15/22   Sherian Maroon A, PA  methylPREDNISolone (MEDROL DOSEPAK) 4 MG TBPK tablet Day 1: 8mg  before breakfast, 4 mg after lunch, 4 mg after supper, and 8 mg at bedtime Day 2: 4 mg before breakfast, 4 mg after lunch, 4 mg  after supper, and 8 mg  at bedtime Day 3:  4 mg  before breakfast, 4 mg  after lunch, 4 mg after supper, and 4 mg  at bedtime Day 4: 4 mg  before breakfast, 4 mg  after lunch, and 4 mg at bedtime Day 5: 4 mg  before breakfast and 4 mg at bedtime Day 6: 4 mg  before breakfast 06/22/22   Melene Plan, DO  metoprolol tartrate (LOPRESSOR) 25 MG tablet Take 1 tablet (25 mg total) by mouth 2 (two) times daily. 04/27/22   [name], MD  oxyCODONE-acetaminophen (PERCOCET/ROXICET) 5-325 MG tablet Take 1 tablet by mouth every 4 (four) hours as needed for severe pain. 06/30/22   Pollyann Savoy, MD  OZEMPIC, 1 MG/DOSE, 4 MG/3ML SOPN Inject 1 mg into the skin once a week. 01/01/22   [provider]  simvastatin (ZOCOR) 10 MG tablet Take 1 tablet (10 mg total) by mouth at bedtime. 04/27/22   [name], MD  tiZANidine (ZANAFLEX) 4 MG tablet Take 1 tablet (4 mg total) by mouth 2 (two) times daily as needed for muscle spasms. 04/27/22   [name], MD     Allergies    Patient has no known allergies.   Review of Systems   Review of Systems Please see HPI for pertinent positives and negatives  Physical Exam BP (!) 170/109   Pulse 87   Temp 99.9 F (37.7 C)  (Oral)   Resp 16   Ht 6\' 3"  (1.905 m)   Wt 133.8 kg   SpO2 100%   BMI 36.87 kg/m   Physical Exam Vitals and nursing note reviewed.  HENT:     Head: Normocephalic.     Nose: Nose normal.  Eyes:     Extraocular Movements: Extraocular movements intact.  Pulmonary:     Effort: Pulmonary effort is normal.  Musculoskeletal:     Cervical back: Neck supple.     Comments: Marked swelling and tenderness of L hand. No erythema or warmth, decreased ROM due to swelling  Skin:    Findings: No rash (on exposed skin).  Neurological:     Mental Status: He is alert and oriented to person, place, and time.  Psychiatric:        Mood and Affect: Mood normal.     ED Results / Procedures / Treatments   EKG None  Procedures Procedures  Medications Ordered in the ED Medications  oxyCODONE-acetaminophen (PERCOCET/ROXICET) 5-325 MG per tablet 2 tablet (has no administration in time range)    Initial Impression and Plan  Patient here with flare of gout, does not appear to be signs of infection. He has history of same with frequent ED visits. Will refill his colchicine, short course of Percocet for pain. Recommend close PCP follow up for long term management and consideration of Rheum evaluation.   ED Course       MDM Rules/Calculators/A&P Medical Decision Making Problems Addressed: Chronic gout of left hand, unspecified cause: chronic illness or injury with exacerbation, progression, or side effects of treatment  Risk Prescription drug management.     Final Clinical Impression(s) / ED Diagnoses Final diagnoses:  Chronic gout of left hand, unspecified cause    Rx / DC Orders ED Discharge Orders          Ordered    colchicine 0.6 MG tablet        06/30/22 0253    oxyCODONE-acetaminophen (PERCOCET/ROXICET) 5-325 MG tablet  Every 4 hours PRN        06/30/22 0254             Pollyann Savoy, MD 06/30/22 [phone]

## 2022-07-06 ENCOUNTER — Other Ambulatory Visit: Payer: Self-pay

## 2022-07-06 ENCOUNTER — Emergency Department (HOSPITAL_COMMUNITY)
Admission: EM | Admit: 2022-07-06 | Discharge: 2022-07-07 | Disposition: A | Discharge status: Home / Self Care | Payer: No Typology Code available for payment source | Attending: Emergency Medicine | Admitting: Emergency Medicine

## 2022-07-06 ENCOUNTER — Emergency Department (HOSPITAL_COMMUNITY): Payer: No Typology Code available for payment source

## 2022-07-06 DIAGNOSIS — Z79899 Other long term (current) drug therapy: Secondary | ICD-10-CM | POA: Insufficient documentation

## 2022-07-06 DIAGNOSIS — M10332 Gout due to renal impairment, left wrist: Secondary | ICD-10-CM | POA: Diagnosis not present

## 2022-07-06 DIAGNOSIS — I129 Hypertensive chronic kidney disease with stage 1 through stage 4 chronic kidney disease, or unspecified chronic kidney disease: Secondary | ICD-10-CM | POA: Diagnosis not present

## 2022-07-06 DIAGNOSIS — Z794 Long term (current) use of insulin: Secondary | ICD-10-CM | POA: Insufficient documentation

## 2022-07-06 DIAGNOSIS — Z7984 Long term (current) use of oral hypoglycemic drugs: Secondary | ICD-10-CM | POA: Diagnosis not present

## 2022-07-06 DIAGNOSIS — E1122 Type 2 diabetes mellitus with diabetic chronic kidney disease: Secondary | ICD-10-CM | POA: Insufficient documentation

## 2022-07-06 DIAGNOSIS — R2233 Localized swelling, mass and lump, upper limb, bilateral: Secondary | ICD-10-CM | POA: Diagnosis present

## 2022-07-06 DIAGNOSIS — M10342 Gout due to renal impairment, left hand: Secondary | ICD-10-CM | POA: Insufficient documentation

## 2022-07-06 DIAGNOSIS — N189 Chronic kidney disease, unspecified: Secondary | ICD-10-CM | POA: Diagnosis not present

## 2022-07-06 DIAGNOSIS — I10 Essential (primary) hypertension: Secondary | ICD-10-CM

## 2022-07-06 LAB — CBC WITH DIFFERENTIAL/PLATELET
Abs Immature Granulocytes: 0.04 10*3/uL (ref 0.00–0.07)
Basophils Absolute: 0.1 10*3/uL (ref 0.0–0.1)
Basophils Relative: 1 %
Eosinophils Absolute: 0.3 10*3/uL (ref 0.0–0.5)
Eosinophils Relative: 3 %
HCT: 30.4 % — ABNORMAL LOW (ref 39.0–52.0)
Hemoglobin: 8.8 g/dL — ABNORMAL LOW (ref 13.0–17.0)
Immature Granulocytes: 0 %
Lymphocytes Relative: 22 %
Lymphs Abs: 2 10*3/uL (ref 0.7–4.0)
MCH: 27 pg (ref 26.0–34.0)
MCHC: 28.9 g/dL — ABNORMAL LOW (ref 30.0–36.0)
MCV: 93.3 fL (ref 80.0–100.0)
Monocytes Absolute: 0.6 10*3/uL (ref 0.1–1.0)
Monocytes Relative: 7 %
Neutro Abs: 6.2 10*3/uL (ref 1.7–7.7)
Neutrophils Relative %: 67 %
Platelets: 443 10*3/uL — ABNORMAL HIGH (ref 150–400)
RBC: 3.26 MIL/uL — ABNORMAL LOW (ref 4.22–5.81)
RDW: 14.3 % (ref 11.5–15.5)
WBC: 9.2 10*3/uL (ref 4.0–10.5)
nRBC: 0 % (ref 0.0–0.2)

## 2022-07-06 LAB — BASIC METABOLIC PANEL
Anion gap: 14 (ref 5–15)
BUN: 34 mg/dL — ABNORMAL HIGH (ref 6–20)
CO2: 20 mmol/L — ABNORMAL LOW (ref 22–32)
Calcium: 6.4 mg/dL — CL (ref 8.9–10.3)
Chloride: 106 mmol/L (ref 98–111)
Creatinine, Ser: 4.72 mg/dL — ABNORMAL HIGH (ref 0.61–1.24)
GFR, Estimated: 14 mL/min — ABNORMAL LOW (ref >60)
Glucose, Bld: 188 mg/dL — ABNORMAL HIGH (ref 70–99)
Potassium: 4 mmol/L (ref 3.5–5.1)
Sodium: 140 mmol/L (ref 135–145)

## 2022-07-06 MED ORDER — HYDROCODONE-ACETAMINOPHEN 5-325 MG PO TABS: 1.0000 | ORAL_TABLET | Freq: Four times a day (QID) | ORAL | 0 refills | Status: DC | PRN

## 2022-07-06 MED ORDER — PREDNISONE 20 MG PO TABS
60.0000 mg | ORAL_TABLET | Freq: Once | ORAL | Status: AC
  Administered 2022-07-06: 60 mg via ORAL
  Filled 2022-07-06: qty 3

## 2022-07-06 MED ORDER — AMLODIPINE BESYLATE 5 MG PO TABS
5.0000 mg | ORAL_TABLET | Freq: Once | ORAL | Status: AC
  Administered 2022-07-06: 5 mg via ORAL
  Filled 2022-07-06: qty 1

## 2022-07-06 MED ORDER — CALCIUM GLUCONATE-NACL 2-0.675 GM/100ML-% IV SOLN
2.0000 g | Freq: Once | INTRAVENOUS | Status: AC
  Administered 2022-07-06: 2000 mg via INTRAVENOUS
  Filled 2022-07-06: qty 100

## 2022-07-06 MED ORDER — OXYCODONE-ACETAMINOPHEN 5-325 MG PO TABS
1.0000 | ORAL_TABLET | Freq: Once | ORAL | Status: AC
  Administered 2022-07-06: 1 via ORAL
  Filled 2022-07-06: qty 1

## 2022-07-06 MED ORDER — PREDNISONE 10 MG PO TABS: ORAL_TABLET | ORAL | 0 refills | Status: AC

## 2022-07-06 MED ORDER — CALCITRIOL 0.5 MCG PO CAPS: 0.5000 ug | ORAL_CAPSULE | Freq: Two times a day (BID) | ORAL | 0 refills | Status: AC

## 2022-07-06 NOTE — ED Triage Notes (Signed)
Pt reports continued gout flare up. Seen at [facility] for same and given prednisone but no improvement. Pain to bilateral elbows and hands and swelling to L hand.

## 2022-07-06 NOTE — ED Provider Triage Note (Signed)
Emergency Medicine Provider Triage Evaluation Note  RIGHTEOUS CHIASSON , a 57 y.o. male  was evaluated in triage.  Pt complains of joint pain and swelling.  Complains of left wrist, left elbow pain and swelling.  Ongoing for about 2 weeks.  This is his third visit.  He has taken prednisone, colchicine, as well as course of pain medication without any significant improvement..  Denies fever.  Review of Systems  Positive: As above Negative: As above  Physical Exam  BP (!) 167/108   Pulse 93   Temp 98.4 F (36.9 C)   Resp 16   SpO2 100%  Gen:   Awake, no distress   Resp:  Normal effort  MSK:   Moves extremities without difficulty  Other:    Medical Decision Making  Medically screening exam initiated at 6:15 PM.  Appropriate orders placed.  [name] was informed that the remainder of the evaluation will be completed by another provider, this initial triage assessment does not replace that evaluation, and the importance of remaining in the ED until their evaluation is complete.    Marita [place], PA-C 07/06/22 1816

## 2022-07-06 NOTE — ED Provider Notes (Signed)
[place] [facility] AT [facility] Provider Note   CSN: [id number] Arrival date & time: 07/06/22  1804     History  Chief Complaint  Patient presents with   Gout    Patrick [name] is a 57 y.o. male.  HPI   Pt has history of chronic kidney disease, diabetes gout.  He has been having joint swelling for the last two weeks.  No fevers.  No falls or injury.  Pt has been treated with prednisone and colchicine but it has not been helping.  Pt is no longer on steroids.  He is still taking the colchicine.  Pt has been seen in the ED twice.  He does not have a rheumatologist.  Home Medications Prior to Admission medications   Medication Sig Start Date End Date Taking? Authorizing Provider  calcitRIOL (ROCALTROL) 0.5 MCG capsule Take 1 capsule (0.5 mcg total) by mouth in the morning and at bedtime. 07/06/22 08/05/22 Yes Linwood Dibbles, MD  HYDROcodone-acetaminophen (NORCO/VICODIN) 5-325 MG tablet Take 1 tablet by mouth every 6 (six) hours as needed. 07/06/22  Yes Linwood Dibbles, MD  predniSONE (DELTASONE) 10 MG tablet Take 6 tablets (60 mg total) by mouth daily with breakfast for 2 days, THEN 5 tablets (50 mg total) daily with breakfast for 2 days, THEN 4 tablets (40 mg total) daily with breakfast for 2 days, THEN 3 tablets (30 mg total) daily with breakfast for 2 days, THEN 2 tablets (20 mg total) daily with breakfast for 2 days, THEN 1 tablet (10 mg total) daily with breakfast for 2 days. 07/06/22 07/17/22 Yes Linwood Dibbles, MD  allopurinol (ZYLOPRIM) 100 MG tablet Take 1 tablet (100 mg total) by mouth 2 (two) times daily. 04/27/22   [name], MD  amLODipine (NORVASC) 5 MG tablet Take 1 tablet (5 mg total) by mouth daily. 04/27/22 07/26/22  [name], MD  blood glucose meter kit and supplies KIT Dispense based on patient and insurance preference. Use up to four times daily as directed. 01/05/22   [name], MD  colchicine 0.6 MG tablet Take 2 tablets (1.2 mg)  during a gout flareup, then take 1 tablet an hour later. 06/30/22   Pollyann Savoy, MD  gabapentin (NEURONTIN) 300 MG capsule Take 1 capsule (300 mg total) by mouth 3 (three) times daily. Patient taking differently: Take 300 mg by mouth daily as needed (pain). 09/08/20   Angiulli, Mcarthur Rossetti, PA-C  insulin aspart (NOVOLOG FLEXPEN) 100 UNIT/ML FlexPen Inject 4 Units into the skin 3 (three) times daily with meals. Patient not taking: Reported on 04/27/2022 09/08/20   Angiulli, Mcarthur Rossetti, PA-C  insulin glargine (LANTUS SOLOSTAR) 100 UNIT/ML Solostar Pen Inject 25 Units into the skin at bedtime. 04/27/22   [name], MD  Insulin Pen Needle 32G X 4 MM MISC Use as directed 09/08/20   [name], [name], MD  Insulin Pen Needle 32G X 4 MM MISC Use as directed 09/09/20   Angiulli, Mcarthur Rossetti, PA-C  JARDIANCE 25 MG TABS tablet Take 1 tablet (25 mg total) by mouth daily. 04/27/22   [name], MD  lidocaine (LIDODERM) 5 % Place 1 patch onto the skin daily. Remove & Discard patch within 12 hours or as directed by MD Patient not taking: Reported on 04/27/2022 01/15/22   Sherian Maroon A, PA  methylPREDNISolone (MEDROL DOSEPAK) 4 MG TBPK tablet Day 1: 8mg  before breakfast, 4 mg after lunch, 4 mg after supper, and 8 mg at  bedtime Day 2: 4 mg before breakfast, 4 mg after lunch, 4 mg  after supper, and 8 mg  at bedtime Day 3:  4 mg  before breakfast, 4 mg  after lunch, 4 mg after supper, and 4 mg  at bedtime Day 4: 4 mg  before breakfast, 4 mg  after lunch, and 4 mg at bedtime Day 5: 4 mg  before breakfast and 4 mg at bedtime Day 6: 4 mg  before breakfast 06/22/22   Melene Plan, DO  metoprolol tartrate (LOPRESSOR) 25 MG tablet Take 1 tablet (25 mg total) by mouth 2 (two) times daily. 04/27/22   [name], MD  oxyCODONE-acetaminophen (PERCOCET/ROXICET) 5-325 MG tablet Take 1 tablet by mouth every 4 (four) hours as needed for severe pain. 06/30/22   Pollyann Savoy, MD  OZEMPIC, 1 MG/DOSE, 4 MG/3ML  SOPN Inject 1 mg into the skin once a week. 01/01/22   [provider]  simvastatin (ZOCOR) 10 MG tablet Take 1 tablet (10 mg total) by mouth at bedtime. 04/27/22   [name], MD  tiZANidine (ZANAFLEX) 4 MG tablet Take 1 tablet (4 mg total) by mouth 2 (two) times daily as needed for muscle spasms. 04/27/22   [name], MD      Allergies    Patient has no known allergies.    Review of Systems   Review of Systems  Physical Exam Updated Vital Signs BP (!) 163/117   Pulse 80   Temp 98.4 F (36.9 C)   Resp 16   SpO2 100%  Physical Exam Vitals and nursing note reviewed.  Constitutional:      General: He is not in acute distress.    Appearance: He is well-developed.  HENT:     Head: Normocephalic and atraumatic.     Right Ear: External ear normal.     Left Ear: External ear normal.  Eyes:     General: No scleral icterus.       Right eye: No discharge.        Left eye: No discharge.     Conjunctiva/sclera: Conjunctivae normal.  Neck:     Trachea: No tracheal deviation.  Cardiovascular:     Rate and Rhythm: Normal rate.  Pulmonary:     Effort: Pulmonary effort is normal. No respiratory distress.     Breath sounds: No stridor.  Abdominal:     General: There is no distension.  Musculoskeletal:        General: No swelling or deformity.     Cervical back: Neck supple.     Comments: Status post bilateral BKA, erythema and edema noted to the left hand, tenderness palpation, no lymphangitic streaking, normal pulses, no cyanosis  Skin:    General: Skin is warm and dry.     Findings: No rash.  Neurological:     Mental Status: He is alert. Mental status is at baseline.     Cranial Nerves: No dysarthria or facial asymmetry.     Motor: No seizure activity.     ED Results / Procedures / Treatments   Labs (all labs ordered are listed, but only abnormal results are displayed) Labs Reviewed  CBC WITH DIFFERENTIAL/PLATELET - Abnormal; Notable for the  following components:      Result Value   RBC 3.26 (*)    Hemoglobin 8.8 (*)    HCT 30.4 (*)    MCHC 28.9 (*)    Platelets 443 (*)    All other components within  normal limits  BASIC METABOLIC PANEL - Abnormal; Notable for the following components:   CO2 20 (*)    Glucose, Bld 188 (*)    BUN 34 (*)    Creatinine, Ser 4.72 (*)    Calcium 6.4 (*)    GFR, Estimated 14 (*)    All other components within normal limits    EKG None  Radiology DG Elbow Complete Left  Result Date: 07/06/2022 CLINICAL DATA:  Joint swelling, no. EXAM: LEFT WRIST - COMPLETE 3+ VIEW; LEFT ELBOW - COMPLETE 3+ VIEW COMPARISON:  None Available. FINDINGS: Left elbow: There is no evidence of fracture or dislocation. There is no evidence of arthropathy or other focal bone abnormality. Soft tissues are unremarkable. Left wrist: There is no evidence of fracture or dislocation. No significant arthropathy or focal bony abnormality. Mild soft tissue swelling about the elbow joint. Vascular calcification. IMPRESSION: LEFT ELBOW: No no radiographic evidence of inflammatory arthropathy. LEFT WRIST: 1. No radiographic evidence of inflammatory arthropathy. 2. Mild soft tissue swelling about the elbow joint. Electronically Signed   By: Larose Hires D.O.   On: 07/06/2022 19:10   DG Wrist Complete Left  Result Date: 07/06/2022 CLINICAL DATA:  Joint swelling, no. EXAM: LEFT WRIST - COMPLETE 3+ VIEW; LEFT ELBOW - COMPLETE 3+ VIEW COMPARISON:  None Available. FINDINGS: Left elbow: There is no evidence of fracture or dislocation. There is no evidence of arthropathy or other focal bone abnormality. Soft tissues are unremarkable. Left wrist: There is no evidence of fracture or dislocation. No significant arthropathy or focal bony abnormality. Mild soft tissue swelling about the elbow joint. Vascular calcification. IMPRESSION: LEFT ELBOW: No no radiographic evidence of inflammatory arthropathy. LEFT WRIST: 1. No radiographic evidence of  inflammatory arthropathy. 2. Mild soft tissue swelling about the elbow joint. Electronically Signed   By: Larose Hires D.O.   On: 07/06/2022 19:10    Procedures Procedures    Medications Ordered in ED Medications  calcium gluconate 2 g/ 100 mL sodium chloride IVPB (2,000 mg Intravenous New Bag/Given 07/06/22 2220)  amLODipine (NORVASC) tablet 5 mg (has no administration in time range)  oxyCODONE-acetaminophen (PERCOCET/ROXICET) 5-325 MG per tablet 1 tablet (1 tablet Oral Given 07/06/22 2140)  predniSONE (DELTASONE) tablet 60 mg (60 mg Oral Given 07/06/22 2140)    ED Course/ Medical Decision Making/ A&P Clinical Course as of 07/06/22 2310  Fri Jul 06, 2022  2124 Discussed with Nephrology, Dr. Signe Colt recommends 2g iv then calcitriol 0.5 bid and then 2 times at night. [JK]    Clinical Course User Index [JK] Linwood Dibbles, MD                             Medical Decision Making Differential diagnosis includes but not limited to gout, infection, vascular occlusion  Risk Prescription drug management.   Exam suggestive of gout flare.  Patient is not having any fevers.  He does not have a leukocytosis.  He has normal pulses.  I suspect this is a recurrent gout exacerbation.  Patient's kidney disease is worsening and he is hypocalcemic.  I discussed with Dr. Signe Colt nephrology.  She recommends calcium IV but this can be managed as an outpatient.  I will start the patient on calcitriol.  Patient also noted to be hypertensive.  He has recently started his blood pressure medications again.  Will give him a dose of Norvasc here in the ED and recommend close follow-up with his primary care  doctor and nephrology.        Final Clinical Impression(s) / ED Diagnoses Final diagnoses:  Acute gout due to renal impairment involving left hand  Hypocalcemia  Chronic kidney disease, unspecified CKD stage  Hypertension, unspecified type    Rx / DC Orders ED Discharge Orders          Ordered     predniSONE (DELTASONE) 10 MG tablet  Q breakfast        07/06/22 2309    HYDROcodone-acetaminophen (NORCO/VICODIN) 5-325 MG tablet  Every 6 hours PRN        07/06/22 2309    calcitRIOL (ROCALTROL) 0.5 MCG capsule  2 times daily        07/06/22 2309              Linwood Dibbles, MD 07/06/22 2310

## 2022-07-06 NOTE — Discharge Instructions (Addendum)
Follow-up with your primary doctor or consider seeing a rheumatologist for further evaluation of your gout.  Follow-up with your kidney doctors regarding your worsening kidney function and calcium levels.  Make sure to take your blood pressure medications regularly and follow-up with your doctor to have that rechecked

## 2022-07-09 ENCOUNTER — Telehealth: Payer: Self-pay

## 2022-07-09 NOTE — Transitions of Care (Post Inpatient/ED Visit) (Signed)
   07/09/2022  Name: Patrick [name] MRN: [id number] DOB: 05-25-1965  Today's TOC FU Call Status: Today's TOC FU Call Status:: Unsuccessul Call (1st Attempt) Unsuccessful Call (1st Attempt) Date: 07/09/22  Attempted to reach the patient regarding the most recent Inpatient/ED visit.  Follow Up Plan: Additional outreach attempts will be made to reach the patient to complete the Transitions of Care (Post Inpatient/ED visit) call.   Signature Arvil Persons, BSN, Charity fundraiser

## 2022-07-10 NOTE — Transitions of Care (Post Inpatient/ED Visit) (Signed)
   07/10/2022  Name: Patrick [name] MRN: [id number] DOB: 07/14/1965  Today's TOC FU Call Status: Today's TOC FU Call Status:: Unsuccessful Call (2nd Attempt) Unsuccessful Call (1st Attempt) Date: 07/09/22 Unsuccessful Call (2nd Attempt) Date: 07/10/22  Attempted to reach the patient regarding the most recent Inpatient/ED visit.  Follow Up Plan: Additional outreach attempts will be made to reach the patient to complete the Transitions of Care (Post Inpatient/ED visit) call.   Signature Arvil Persons, BSN, Charity fundraiser

## 2022-07-18 NOTE — Transitions of Care (Post Inpatient/ED Visit) (Signed)
   07/18/2022  Name: POYRAZ SHAUB MRN: [id number] DOB: 21-Jan-1966  Today's TOC FU Call Status: Today's TOC FU Call Status:: Unsuccessful Call (3rd Attempt) Unsuccessful Call (1st Attempt) Date: 07/09/22 Unsuccessful Call (2nd Attempt) Date: 07/10/22 Unsuccessful Call (3rd Attempt) Date: 07/18/22  Attempted to reach the patient regarding the most recent Inpatient/ED visit.  Follow Up Plan: No further outreach attempts will be made at this time. We have been unable to contact the patient.  Signature  Arvil Persons, BSN, Charity fundraiser

## 2022-07-19 NOTE — Transitions of Care (Post Inpatient/ED Visit) (Signed)
07/19/2022  Name: Patrick [name] MRN: [id number] DOB: January 15, 1966  Today's TOC FU Call Status: Today's TOC FU Call Status:: Successful TOC FU Call Competed Unsuccessful Call (1st Attempt) Date: 07/09/22 Unsuccessful Call (2nd Attempt) Date: 07/10/22 Unsuccessful Call (3rd Attempt) Date: 07/18/22 [facility] FU Call Complete Date: 07/19/22  Transition Care Management Follow-up Telephone Call Date of Discharge: 07/07/22 Discharge Facility: [facility]) Type of Discharge: Emergency Department Reason for ED Visit:  (gout) How have you been since you were released from the hospital?: Same Any questions or concerns?: No  Items Reviewed: Did you receive and understand the discharge instructions provided?: Yes Medications obtained,verified, and reconciled?: Yes (Medications Reviewed) Any new allergies since your discharge?: No Do you have support at home?: Yes  Medications Reviewed Today: Medications Reviewed Today     Reviewed by Elvin So, RN (Registered Nurse) on 06/30/22 at 0010  Med List Status: <None>   Medication Order Taking? Sig Documenting Provider Last Dose Status Informant  allopurinol (ZYLOPRIM) 100 MG tablet [id number]  Take 1 tablet (100 mg total) by mouth 2 (two) times daily. [name], MD  Active   amLODipine (NORVASC) 5 MG tablet [id number]  Take 1 tablet (5 mg total) by mouth daily. [name], MD  Active   blood glucose meter kit and supplies KIT [id number]  Dispense based on patient and insurance preference. Use up to four times daily as directed. [name], MD  Active Self, Pharmacy Records  colchicine 0.6 MG tablet [id number]  Take 2 tablets (1.2 mg) during a gout flareup, then take 1 tablet an hour later. Melene Plan, DO  Active   gabapentin (NEURONTIN) 300 MG capsule [id number]  Take 1 capsule (300 mg total) by mouth 3 (three) times daily.  Patient taking differently: Take 300 mg by mouth daily as needed (pain).   Charlton Amor, PA-C  Active Self, Pharmacy Records           Med Note Hal Hope, [facility] M   Wed Jan 10, 2022  4:44 PM) Recent Dispenses     09/13/2021 300 MG CAPS (disp 270, 90d supply)    insulin aspart (NOVOLOG FLEXPEN) 100 UNIT/ML FlexPen [id number]  Inject 4 Units into the skin 3 (three) times daily with meals.  Patient not taking: Reported on 04/27/2022   Charlton Amor, PA-C  Active Self, Pharmacy Records           Med Note Hal Hope, [facility] Jan 10, 2022  4:45 PM) Recent Dispenses     11/06/2021 100 UNIT/ML SOPN (disp 15, 34d supply)    insulin glargine (LANTUS SOLOSTAR) 100 UNIT/ML Solostar Pen [id number]  Inject 25 Units into the skin at bedtime. [name], MD  Active   Insulin Pen Needle 32G X 4 MM MISC [id number]  Use as directed [name], [name], MD  Active Self, Pharmacy Records  Insulin Pen Needle 32G X 4 MM MISC [id number]  Use as directed Angiulli, Mcarthur Rossetti, PA-C  Active Self, Pharmacy Records  JARDIANCE 25 MG TABS tablet [id number]  Take 1 tablet (25 mg total) by mouth daily. [name], MD  Active   lidocaine (LIDODERM) 5 % [id number]  Place 1 patch onto the skin daily. Remove & Discard patch within 12 hours or as directed by MD  Patient not taking: Reported on 04/27/2022   Sherian Maroon A, PA  Active   methylPREDNISolone (MEDROL DOSEPAK) 4 MG TBPK tablet [id number]  Day 1: 8mg  before  breakfast, 4 mg after lunch, 4 mg after supper, and 8 mg at bedtime Day 2: 4 mg before breakfast, 4 mg after lunch, 4 mg  after supper, and 8 mg  at bedtime Day 3:  4 mg  before breakfast, 4 mg  after lunch, 4 mg after supper, and 4 mg  at bedtime Day 4: 4 mg  before breakfast, 4 mg  after lunch, and 4 mg at bedtime Day 5: 4 mg  before breakfast and 4 mg at bedtime Day 6: 4 mg  before breakfast Melene Plan, DO  Active   metoprolol tartrate (LOPRESSOR) 25 MG tablet [id number]  Take 1 tablet (25 mg total) by mouth 2 (two) times daily. [name], MD  Active    oxyCODONE (ROXICODONE) 5 MG immediate release tablet [id number]  Take 1 tablet (5 mg total) by mouth every 8 (eight) hours as needed for moderate pain. [name], Alvira Philips, DO  Active   oxyCODONE-acetaminophen (PERCOCET/ROXICET) 5-325 MG tablet [id number]  Take 1 tablet by mouth every 6 (six) hours as needed for severe pain. Horton, Mayer Masker, MD  Active   oxyCODONE-acetaminophen (PERCOCET/ROXICET) 5-325 MG tablet [id number]  Take 1 tablet by mouth every 4 (four) hours as needed for severe pain. Tegeler, Canary Brim, MD  Active   OZEMPIC, 1 MG/DOSE, 4 MG/3ML SOPN [id number]  Inject 1 mg into the skin once a week. [provider]  Active Self, Pharmacy Records           Med Note Hal Hope, [facility] Jan 10, 2022  4:58 PM) Recent Dispenses     01/01/2022 4 MG/3ML SOPN (disp 3, 28d supply    simvastatin (ZOCOR) 10 MG tablet [id number]  Take 1 tablet (10 mg total) by mouth at bedtime. [name], MD  Active   tiZANidine (ZANAFLEX) 4 MG tablet [id number]  Take 1 tablet (4 mg total) by mouth 2 (two) times daily as needed for muscle spasms. [name], MD  Active             Home Care and Equipment/Supplies: Were Home Health Services Ordered?: NA Any new equipment or medical supplies ordered?: NA  Functional Questionnaire: Do you need assistance with bathing/showering or dressing?: No Do you need assistance with meal preparation?: No Do you need assistance with eating?: No Do you have difficulty maintaining continence: No Do you need assistance with getting out of bed/getting out of a chair/moving?: No Do you have difficulty managing or taking your medications?: No  Follow up appointments reviewed: PCP Follow-up appointment confirmed?: Yes Date of PCP follow-up appointment?: 07/25/22 Follow-up Provider: [facility] Follow-up appointment confirmed?: No Do you need transportation to your follow-up appointment?: No Do you understand care  options if your condition(s) worsen?: Yes-patient verbalized understanding    SIGNATURE Arvil Persons, BSN, RN

## 2022-07-23 ENCOUNTER — Encounter (HOSPITAL_BASED_OUTPATIENT_CLINIC_OR_DEPARTMENT_OTHER): Payer: Self-pay | Admitting: *Deleted

## 2022-07-23 ENCOUNTER — Other Ambulatory Visit: Payer: Self-pay

## 2022-07-23 ENCOUNTER — Emergency Department (HOSPITAL_BASED_OUTPATIENT_CLINIC_OR_DEPARTMENT_OTHER)
Admission: EM | Admit: 2022-07-23 | Discharge: 2022-07-24 | Disposition: A | Discharge status: Home / Self Care | Payer: No Typology Code available for payment source | Attending: Emergency Medicine | Admitting: Emergency Medicine

## 2022-07-23 DIAGNOSIS — M25531 Pain in right wrist: Secondary | ICD-10-CM | POA: Diagnosis present

## 2022-07-23 DIAGNOSIS — M109 Gout, unspecified: Secondary | ICD-10-CM | POA: Diagnosis not present

## 2022-07-23 DIAGNOSIS — Z794 Long term (current) use of insulin: Secondary | ICD-10-CM | POA: Diagnosis not present

## 2022-07-23 NOTE — ED Triage Notes (Signed)
Pt is here for gout pain in elbows and wrist.  Pt is out of his medication including gout and BP meds

## 2022-07-24 DIAGNOSIS — M109 Gout, unspecified: Secondary | ICD-10-CM | POA: Diagnosis not present

## 2022-07-24 LAB — CBG MONITORING, ED: Glucose-Capillary: 159 mg/dL — ABNORMAL HIGH (ref 70–99)

## 2022-07-24 MED ORDER — ONDANSETRON 4 MG PO TBDP
4.0000 mg | ORAL_TABLET | Freq: Once | ORAL | Status: AC
  Administered 2022-07-24: 4 mg via ORAL
  Filled 2022-07-24: qty 1

## 2022-07-24 MED ORDER — MORPHINE SULFATE (PF) 4 MG/ML IV SOLN
4.0000 mg | Freq: Once | INTRAVENOUS | Status: AC
  Administered 2022-07-24: 4 mg via INTRAMUSCULAR
  Filled 2022-07-24: qty 1

## 2022-07-24 MED ORDER — PREDNISONE 10 MG (21) PO TBPK: ORAL_TABLET | Freq: Every day | ORAL | 0 refills | Status: AC

## 2022-07-24 MED ORDER — OXYCODONE HCL 5 MG PO TABS: 5.0000 mg | ORAL_TABLET | ORAL | 0 refills | Status: AC | PRN

## 2022-07-24 MED ORDER — OXYCODONE-ACETAMINOPHEN 5-325 MG PO TABS
2.0000 | ORAL_TABLET | Freq: Once | ORAL | Status: AC
  Administered 2022-07-24: 2 via ORAL
  Filled 2022-07-24: qty 2

## 2022-07-24 NOTE — ED Provider Notes (Signed)
[place] EMERGENCY DEPARTMENT AT [facility] Provider Note   CSN: [id number] Arrival date & time: 07/23/22  2005     History  Chief Complaint  Patient presents with   Gout    Patrick [name] is a 57 y.o. male who presents to the ED complaining of gout flareup.  Patient states that he had the same a couple weeks ago for which she was seen in the ED.  He reports that he has arranged PCP and rheumatology follow-up but it is not scheduled until next week.  Notes that he had improvement with steroids at last visit.  States that blood sugar has been well-controlled and the highest he has seen at home is 140.  No history of diabetic emergency per patient.  Patient complains of joint pain to both wrists, hands, and elbows.  No associated fever.  No chest pain or shortness of breath.  No other complaints.  States that symptoms are similar to previous gout flareups.  No injury to the joints.      Home Medications Prior to Admission medications   Medication Sig Start Date End Date Taking? Authorizing Provider  oxyCODONE (ROXICODONE) 5 MG immediate release tablet Take 1 tablet (5 mg total) by mouth every 4 (four) hours as needed for up to 3 days for severe pain. 07/24/22 07/27/22 Yes Council Munguia L, PA-C  predniSONE (STERAPRED UNI-PAK 21 TAB) 10 MG (21) TBPK tablet Take by mouth daily for 12 days. Take 6 tabs by mouth daily  for 2 days, then 5 tabs for 2 days, then 4 tabs for 2 days, then 3 tabs for 2 days, 2 tabs for 2 days, then 1 tab by mouth daily for 2 days 07/24/22 08/05/22 Yes Leviathan Macera L, PA-C  allopurinol (ZYLOPRIM) 100 MG tablet Take 1 tablet (100 mg total) by mouth 2 (two) times daily. 04/27/22   [name], MD  amLODipine (NORVASC) 5 MG tablet Take 1 tablet (5 mg total) by mouth daily. 04/27/22 07/26/22  [name], MD  blood glucose meter kit and supplies KIT Dispense based on patient and insurance preference. Use up to four times daily as directed. 01/05/22    [name], MD  calcitRIOL (ROCALTROL) 0.5 MCG capsule Take 1 capsule (0.5 mcg total) by mouth in the morning and at bedtime. 07/06/22 08/05/22  Linwood Dibbles, MD  colchicine 0.6 MG tablet Take 2 tablets (1.2 mg) during a gout flareup, then take 1 tablet an hour later. 06/30/22   Pollyann Savoy, MD  gabapentin (NEURONTIN) 300 MG capsule Take 1 capsule (300 mg total) by mouth 3 (three) times daily. Patient taking differently: Take 300 mg by mouth daily as needed (pain). 09/08/20   Angiulli, Mcarthur Rossetti, PA-C  HYDROcodone-acetaminophen (NORCO/VICODIN) 5-325 MG tablet Take 1 tablet by mouth every 6 (six) hours as needed. 07/06/22   Linwood Dibbles, MD  insulin aspart (NOVOLOG FLEXPEN) 100 UNIT/ML FlexPen Inject 4 Units into the skin 3 (three) times daily with meals. Patient not taking: Reported on 04/27/2022 09/08/20   Angiulli, Mcarthur Rossetti, PA-C  insulin glargine (LANTUS SOLOSTAR) 100 UNIT/ML Solostar Pen Inject 25 Units into the skin at bedtime. 04/27/22   [name], MD  Insulin Pen Needle 32G X 4 MM MISC Use as directed 09/08/20   [name], [name], MD  Insulin Pen Needle 32G X 4 MM MISC Use as directed 09/09/20   Angiulli, Mcarthur Rossetti, PA-C  JARDIANCE 25 MG TABS tablet Take 1 tablet (25 mg total)  by mouth daily. 04/27/22   [name], MD  lidocaine (LIDODERM) 5 % Place 1 patch onto the skin daily. Remove & Discard patch within 12 hours or as directed by MD Patient not taking: Reported on 04/27/2022 01/15/22   Sherian Maroon A, PA  methylPREDNISolone (MEDROL DOSEPAK) 4 MG TBPK tablet Day 1: 8mg  before breakfast, 4 mg after lunch, 4 mg after supper, and 8 mg at bedtime Day 2: 4 mg before breakfast, 4 mg after lunch, 4 mg  after supper, and 8 mg  at bedtime Day 3:  4 mg  before breakfast, 4 mg  after lunch, 4 mg after supper, and 4 mg  at bedtime Day 4: 4 mg  before breakfast, 4 mg  after lunch, and 4 mg at bedtime Day 5: 4 mg  before breakfast and 4 mg at bedtime Day 6: 4 mg  before breakfast  06/22/22   Melene Plan, DO  metoprolol tartrate (LOPRESSOR) 25 MG tablet Take 1 tablet (25 mg total) by mouth 2 (two) times daily. 04/27/22   [name], MD  oxyCODONE-acetaminophen (PERCOCET/ROXICET) 5-325 MG tablet Take 1 tablet by mouth every 4 (four) hours as needed for severe pain. 06/30/22   Pollyann Savoy, MD  OZEMPIC, 1 MG/DOSE, 4 MG/3ML SOPN Inject 1 mg into the skin once a week. 01/01/22   [provider]  simvastatin (ZOCOR) 10 MG tablet Take 1 tablet (10 mg total) by mouth at bedtime. 04/27/22   [name], MD  tiZANidine (ZANAFLEX) 4 MG tablet Take 1 tablet (4 mg total) by mouth 2 (two) times daily as needed for muscle spasms. 04/27/22   [name], MD      Allergies    Patient has no known allergies.    Review of Systems   Review of Systems  All other systems reviewed and are negative.   Physical Exam Updated Vital Signs BP (!) 159/118   Pulse 94   Temp 98 F (36.7 C) (Oral)   Resp 18   SpO2 100%  Physical Exam Vitals and nursing note reviewed.  Constitutional:      General: He is not in acute distress.    Appearance: Normal appearance.  HENT:     Head: Normocephalic and atraumatic.     Mouth/Throat:     Mouth: Mucous membranes are moist.  Eyes:     Conjunctiva/sclera: Conjunctivae normal.  Cardiovascular:     Rate and Rhythm: Normal rate and regular rhythm.     Heart sounds: No murmur heard. Pulmonary:     Effort: Pulmonary effort is normal.     Breath sounds: Normal breath sounds.  Abdominal:     General: Abdomen is flat.     Palpations: Abdomen is soft.     Tenderness: There is no abdominal tenderness.  Musculoskeletal:     Cervical back: Neck supple.     Comments: Increased warmth with minimal swelling over bilateral hands, increased warmth to bilateral wrists and elbows without increased warmth, no erythema or wounds over joints, range of motion intact, strong distal pulses, sensation intact, joints stable   Skin:    General: Skin is warm and dry.     Capillary Refill: Capillary refill takes less than 2 seconds.  Neurological:     Mental Status: He is alert. Mental status is at baseline.  Psychiatric:        Behavior: Behavior normal.     ED Results / Procedures / Treatments   Labs (all labs  ordered are listed, but only abnormal results are displayed) Labs Reviewed  CBG MONITORING, ED - Abnormal; Notable for the following components:      Result Value   Glucose-Capillary 159 (*)    All other components within normal limits    EKG None  Radiology No results found.  Procedures Procedures    Medications Ordered in ED Medications  oxyCODONE-acetaminophen (PERCOCET/ROXICET) 5-325 MG per tablet 2 tablet (2 tablets Oral Given 07/24/22 0022)  morphine (PF) 4 MG/ML injection 4 mg (4 mg Intramuscular Given 07/24/22 0049)  ondansetron (ZOFRAN-ODT) disintegrating tablet 4 mg (4 mg Oral Given 07/24/22 0048)    ED Course/ Medical Decision Making/ A&P                             Medical Decision Making Risk Prescription drug management.   Medical Decision Making:   Patrick [name] is a 57 y.o. male who presented to the ED today with joint pain detailed above.    Patient's presentation is complicated by their history of DM, frequent ED visits.  Complete initial physical exam performed, notably the patient was in NAD. Increased warmth with minimal swelling over bilateral hands, increased warmth to bilateral wrists and elbows without increased warmth, no erythema or wounds over joints, range of motion intact, strong distal pulses, sensation intact, joints stable.    Reviewed and confirmed nursing documentation for past medical history, family history, social history.    Initial Assessment:   With the patient's presentation, differential diagnosis includes but is not limited to gout flare, cellulitis, septic joint, traumatic injury.  This is most consistent with an acute complicated  illness  Initial Plan:  CBG Symptomatic management Objective evaluation as below reviewed   Initial Study Results:    Radiology:  All images reviewed independently from previous visit.  DG Elbow Complete Left  Result Date: 07/06/2022 CLINICAL DATA:  Joint swelling, no. EXAM: LEFT WRIST - COMPLETE 3+ VIEW; LEFT ELBOW - COMPLETE 3+ VIEW COMPARISON:  None Available. FINDINGS: Left elbow: There is no evidence of fracture or dislocation. There is no evidence of arthropathy or other focal bone abnormality. Soft tissues are unremarkable. Left wrist: There is no evidence of fracture or dislocation. No significant arthropathy or focal bony abnormality. Mild soft tissue swelling about the elbow joint. Vascular calcification. IMPRESSION: LEFT ELBOW: No no radiographic evidence of inflammatory arthropathy. LEFT WRIST: 1. No radiographic evidence of inflammatory arthropathy. 2. Mild soft tissue swelling about the elbow joint. Electronically Signed   By: Larose Hires D.O.   On: 07/06/2022 19:10   DG Wrist Complete Left  Result Date: 07/06/2022 CLINICAL DATA:  Joint swelling, no. EXAM: LEFT WRIST - COMPLETE 3+ VIEW; LEFT ELBOW - COMPLETE 3+ VIEW COMPARISON:  None Available. FINDINGS: Left elbow: There is no evidence of fracture or dislocation. There is no evidence of arthropathy or other focal bone abnormality. Soft tissues are unremarkable. Left wrist: There is no evidence of fracture or dislocation. No significant arthropathy or focal bony abnormality. Mild soft tissue swelling about the elbow joint. Vascular calcification. IMPRESSION: LEFT ELBOW: No no radiographic evidence of inflammatory arthropathy. LEFT WRIST: 1. No radiographic evidence of inflammatory arthropathy. 2. Mild soft tissue swelling about the elbow joint. Electronically Signed   By: Larose Hires D.O.   On: 07/06/2022 19:10      Final Assessment and Plan:   57 year old male presents to the ED with arthralgias.  He complains of pain to  both  hands, wrists, and elbows.  States it is consistent with his gout.  He has increased warmth particularly to the hands with associated swelling.  No obvious deformity.  Range of motion and neurovascular exam intact.  Patient states he is only here for pain control and has follow-up scheduled with PCP and rheumatology.  PDMP reviewed.  No prescriptions since previous ED visit.  Pt states symptoms consistent with gout and he is here for pain control only and is comfortable deferring imaging.  Reviewed x-rays from previous visit. No injury. Reports good glycemic control.  Will treat with steroids at home.  Patient cautioned against using steroids when blood sugars are high.  He will contact PCP to discuss further pain control.  Nontoxic appearing, afebrile. Aware that he should not keep returning to the ED for pain management. Strict ED return precautions given, all questions answered, and stable for discharge.    Clinical Impression:  1. Acute gout of multiple sites, unspecified cause      Discharge           Final Clinical Impression(s) / ED Diagnoses Final diagnoses:  Acute gout of multiple sites, unspecified cause    Rx / DC Orders ED Discharge Orders          Ordered    predniSONE (STERAPRED UNI-PAK 21 TAB) 10 MG (21) TBPK tablet  Daily        07/24/22 0121    oxyCODONE (ROXICODONE) 5 MG immediate release tablet  Every 4 hours PRN        07/24/22 0121              Tonette Lederer, PA-C 07/24/22 0124    Pollyann Savoy, MD 07/24/22 [phone]

## 2022-07-24 NOTE — Discharge Instructions (Signed)
Thank you for letting us take care of you today.  We gave you pain medications to help treat your gout. I am putting you on steroids on home to treat your gout flare. You should not take these if your blood sugar is running high as we discussed. I prescribed a small amount of pain medication. Contact your PCP tomorrow to discuss further medication for pain control and possibility of sooner appointment than next week as scheduled. Keep follow up with rheumatology. For new or worsening symptoms such as fever, chest pain, shortness of breath, injury, or other new, concerning symptoms, please return to ED for re-evaluation.

## 2022-07-25 ENCOUNTER — Inpatient Hospital Stay: Payer: Medicaid Other | Admitting: Family Medicine

## 2022-07-25 ENCOUNTER — Telehealth: Payer: Self-pay | Admitting: Family Medicine

## 2022-07-25 NOTE — Telephone Encounter (Signed)
6.5.24 no show letter sent 

## 2022-07-26 NOTE — Telephone Encounter (Signed)
No show history  04/13/22 no show, new pt visit at [facility] 05/11/22 same day cancel/forgot and at work 05/15/22 no show, mailed final warning letter 07/25/22 no show, dismissal mailed

## 2022-08-14 ENCOUNTER — Other Ambulatory Visit (INDEPENDENT_AMBULATORY_CARE_PROVIDER_SITE_OTHER): Payer: No Typology Code available for payment source

## 2022-08-14 ENCOUNTER — Encounter: Payer: Self-pay | Admitting: Family

## 2022-08-14 ENCOUNTER — Ambulatory Visit (INDEPENDENT_AMBULATORY_CARE_PROVIDER_SITE_OTHER): Payer: No Typology Code available for payment source | Admitting: Family

## 2022-08-14 DIAGNOSIS — M5416 Radiculopathy, lumbar region: Secondary | ICD-10-CM | POA: Diagnosis not present

## 2022-08-14 DIAGNOSIS — Z89511 Acquired absence of right leg below knee: Secondary | ICD-10-CM

## 2022-08-14 DIAGNOSIS — S88111S Complete traumatic amputation at level between knee and ankle, right lower leg, sequela: Secondary | ICD-10-CM

## 2022-08-14 DIAGNOSIS — M25551 Pain in right hip: Secondary | ICD-10-CM

## 2022-08-14 MED ORDER — DOXYCYCLINE HYCLATE 100 MG PO TABS: 100.0000 mg | ORAL_TABLET | Freq: Two times a day (BID) | ORAL | 0 refills | Status: DC

## 2022-08-14 MED ORDER — PREDNISONE 10 MG PO TABS: 20.0000 mg | ORAL_TABLET | Freq: Every day | ORAL | 0 refills | Status: DC

## 2022-08-14 NOTE — Progress Notes (Signed)
Office Visit Note   Patient: Patrick [name]           Date of Birth: [birth year]           MRN: [id number] Visit Date: 08/14/2022              Requested by: No referring provider defined for this encounter. PCP: Pcp, No  Chief Complaint  Patient presents with   Right Leg - Follow-up    Hx right BKA      HPI: The patient is a 57 year old gentleman who is seen today for 2 separate issues.  He is status post bilateral below-knee amputation  Today having right lateral hip pain which does not radiate.  Does not feel like previous episodes of sciatica however he has not had right-sided lumbar radiculopathy in the past.  No radiation to his groin no numbness no tingling no weakness pain comes and goes does not feel worse when lying flat  Is also complaining of a quarter sized wound to his right residual limb.  States has been present for 2 weeks now.  Feels that is possible from the poor fitting socket has noticed increased edema has been having difficulty donning his prosthesis on the right denies fevers or chills or purulence  Assessment & Plan: Visit Diagnoses:  1. Pain in right hip     Plan: Discussed possible fabrication of new socket versus modifications to current socket recommended he use a liner liner or a shrinker beneath his liner for his prosthesis given an order for these to [facility].  He will continue close monitoring of the wound today was packed open with vive sock material.  Placed on course of antibiotics for cellulitis right residual limb.  Lumbar radiculopathy right sided as well.  Will proceed with MRI of the lumbar spine.  Follow-Up Instructions: No follow-ups on file.   Right Hip Exam   Tenderness  Right hip tenderness location: diffuse proximal lateral tenderness.  Tests  FABER: negative  Comments:  Painless rom hip      Patient is alert, oriented, no adenopathy, well-dressed, normal affect, normal respiratory effort. On examination of the right  residual limb he does have pitting edema up to the groin.  There is mild erythema of the residual limb below the knee.  There is no ascending cellulitis  The residual limb with a lateral wound over the distal fibula this is 35 mm in diameter with 5 mm of depth filled in with necrotic tissue this was debrided with a 10 blade knife and packed open.  There is no active drainage or purulence  Imaging: No results found. No images are attached to the encounter.  Labs: Lab Results  Component Value Date   HGBA1C 6.7 (H) 01/04/2022   HGBA1C 8.2 (H) 12/31/2020   HGBA1C 7.8 (H) 08/27/2020   ESRSEDRATE 85 (H) 01/08/2022   ESRSEDRATE 86 (H) 12/29/2021   ESRSEDRATE 95 (H) 09/17/2020   CRP 14.5 (H) 01/09/2022   CRP 10.3 (H) 12/29/2021   CRP 19.0 (H) 09/17/2020   LABURIC 9.0 (H) 01/04/2022   LABURIC 9.3 (H) 06/01/2021   LABURIC 11.2 (H) 08/29/2020   REPTSTATUS 01/10/2022 FINAL 01/08/2022   GRAMSTAIN  06/06/2020    RARE WBC PRESENT, PREDOMINANTLY PMN ABUNDANT GRAM POSITIVE COCCI RARE GRAM POSITIVE RODS    CULT  01/08/2022    NO GROWTH Performed at [facility], 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  Lab Results  Component Value Date   ALBUMIN 3.4 (L) 01/08/2022   ALBUMIN 3.0 (L) 01/03/2022   ALBUMIN 2.3 (L) 09/26/2020   PREALBUMIN 8.2 (L) 09/17/2020   PREALBUMIN 5.1 (L) 06/04/2020    Lab Results  Component Value Date   MG 1.3 (L) 09/29/2020   MG 1.3 (L) 06/06/2020   MG 1.7 05/03/2019   Lab Results  Component Value Date   VD25OH 22 (L) 06/01/2021    Lab Results  Component Value Date   PREALBUMIN 8.2 (L) 09/17/2020   PREALBUMIN 5.1 (L) 06/04/2020      Latest Ref Rng & Units 07/06/2022    6:45 PM 03/26/2022    2:45 PM 01/10/2022    4:07 AM  CBC EXTENDED  WBC 4.0 - 10.5 K/uL 9.2  7.9  16.1   RBC 4.22 - 5.81 MIL/uL 3.26  3.33  2.93   Hemoglobin 13.0 - 17.0 g/dL 8.8  9.1  7.9   HCT 61.6 - 52.0 %  30.4  28.9  25.5   Platelets 150 - 400 K/uL 443  352  344   NEUT# 1.7 - 7.7 K/uL 6.2  5.1    Lymph# 0.7 - 4.0 K/uL 2.0  2.0       There is no height or weight on file to calculate BMI.  Orders:  Orders Placed This Encounter  Procedures   XR HIP UNILAT W OR W/O PELVIS 2-3 VIEWS RIGHT   XR Lumbar Spine 2-3 Views   No orders of the defined types were placed in this encounter.    Procedures: No procedures performed  Clinical Data: No additional findings.  ROS:  All other systems negative, except as noted in the HPI. Review of Systems  Constitutional: Negative.   Cardiovascular:  Positive for leg swelling.  Musculoskeletal:  Positive for back pain, gait problem and myalgias.  Neurological:  Negative for weakness and numbness.    Objective: Vital Signs: There were no vitals taken for this visit.  Specialty Comments:  No specialty comments available.  PMFS History: Patient Active Problem List   Diagnosis Date Noted   Elevated cholesterol 04/27/2022   Right arm cellulitis 01/09/2022   Hypoglycemia 01/03/2022   Left below-knee amputee (HCC) 12/30/2020   Subacute osteomyelitis, left ankle and foot (HCC)    Cholelithiasis with acute cholecystitis 09/25/2020   Hypertensive urgency 09/25/2020   Constipation 09/25/2020   Thrombocytosis 09/25/2020   S/P BKA (below knee amputation) unilateral, right (HCC) 09/01/2020   ABLA (acute blood loss anemia) 09/01/2020   Anemia 08/27/2020   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/20/2020   Dehiscence of amputation stump (HCC) 07/20/2020   Cutaneous abscess of right foot    Osteomyelitis of right foot (HCC)    Severe protein-calorie malnutrition (HCC)    Sepsis due to cellulitis (HCC) 06/04/2020   Diabetic foot ulcer (HCC) 06/04/2020   Diabetic foot infection (HCC) 06/04/2020   CKD (chronic kidney disease) stage 3, GFR 30-59 ml/min (HCC) 06/04/2020   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   Acute  respiratory disease due to COVID-19 virus 05/03/2019   Renal insufficiency 05/03/2019   GOUT 01/27/2010   OBESITY 01/27/2010   Uncontrolled type 2 diabetes mellitus with hyperglycemia, with long-term current use of insulin (HCC) 02/20/2008   Uncontrolled hypertension 02/20/2008   Past Medical History:  Diagnosis Date   Anemia    Diabetes mellitus    Gout    History of COVID-19 04/2019   Hypertension  PVD (peripheral vascular disease) (HCC)    s/p R BKA    Family History  Problem Relation Age of Onset   Diabetes Neg Hx     Past Surgical History:  Procedure Laterality Date   AMPUTATION Right 06/06/2020   Procedure: AMPUTATION 5TH RAY;  Surgeon: Nadara Mustard, MD;  Location: [facility] OR;  Service: Orthopedics;  Laterality: Right;   AMPUTATION Right 06/08/2020   Procedure: RIGHT 4TH RAY AMPUTATION;  Surgeon: Nadara Mustard, MD;  Location: [facility] OR;  Service: Orthopedics;  Laterality: Right;   AMPUTATION Right 08/27/2020   Procedure: AMPUTATION BELOW KNEE;  Surgeon: Kerrin Champagne, MD;  Location: [facility] OR;  Service: Orthopedics;  Laterality: Right;   AMPUTATION Left 12/30/2020   Procedure: LEFT BELOW KNEE AMPUTATION;  Surgeon: Nadara Mustard, MD;  Location: [facility] OR;  Service: Orthopedics;  Laterality: Left;   CHOLECYSTECTOMY N/A 09/25/2020   Procedure: LAPAROSCOPIC CHOLECYSTECTOMY;  Surgeon: Diamantina Monks, MD;  Location: [facility] OR;  Service: General;  Laterality: N/A;   Social History   Occupational History   Occupation: logistics  Tobacco Use   Smoking status: Never    Passive exposure: Never   Smokeless tobacco: Never  Vaping Use   Vaping Use: Never used  Substance and Sexual Activity   Alcohol use: No   Drug use: No   Sexual activity: Not Currently

## 2022-08-15 ENCOUNTER — Telehealth (HOSPITAL_BASED_OUTPATIENT_CLINIC_OR_DEPARTMENT_OTHER): Payer: Self-pay

## 2022-09-02 ENCOUNTER — Ambulatory Visit (HOSPITAL_BASED_OUTPATIENT_CLINIC_OR_DEPARTMENT_OTHER): Admission: RE | Admit: 2022-09-02 | Payer: No Typology Code available for payment source | Source: Ambulatory Visit

## 2022-09-03 ENCOUNTER — Telehealth (HOSPITAL_BASED_OUTPATIENT_CLINIC_OR_DEPARTMENT_OTHER): Payer: Self-pay

## 2022-09-09 ENCOUNTER — Ambulatory Visit (HOSPITAL_BASED_OUTPATIENT_CLINIC_OR_DEPARTMENT_OTHER): Payer: No Typology Code available for payment source

## 2022-09-22 ENCOUNTER — Other Ambulatory Visit: Payer: Self-pay

## 2022-09-22 ENCOUNTER — Encounter (HOSPITAL_BASED_OUTPATIENT_CLINIC_OR_DEPARTMENT_OTHER): Payer: Self-pay | Admitting: Emergency Medicine

## 2022-09-22 ENCOUNTER — Emergency Department (HOSPITAL_BASED_OUTPATIENT_CLINIC_OR_DEPARTMENT_OTHER)
Admission: EM | Admit: 2022-09-22 | Discharge: 2022-09-22 | Disposition: A | Discharge status: Home / Self Care | Payer: No Typology Code available for payment source | Attending: Emergency Medicine | Admitting: Emergency Medicine

## 2022-09-22 DIAGNOSIS — E1122 Type 2 diabetes mellitus with diabetic chronic kidney disease: Secondary | ICD-10-CM | POA: Insufficient documentation

## 2022-09-22 DIAGNOSIS — M1000 Idiopathic gout, unspecified site: Secondary | ICD-10-CM | POA: Insufficient documentation

## 2022-09-22 DIAGNOSIS — Z79899 Other long term (current) drug therapy: Secondary | ICD-10-CM | POA: Diagnosis not present

## 2022-09-22 DIAGNOSIS — N189 Chronic kidney disease, unspecified: Secondary | ICD-10-CM | POA: Diagnosis not present

## 2022-09-22 DIAGNOSIS — I129 Hypertensive chronic kidney disease with stage 1 through stage 4 chronic kidney disease, or unspecified chronic kidney disease: Secondary | ICD-10-CM | POA: Diagnosis not present

## 2022-09-22 DIAGNOSIS — M5431 Sciatica, right side: Secondary | ICD-10-CM | POA: Insufficient documentation

## 2022-09-22 DIAGNOSIS — M109 Gout, unspecified: Secondary | ICD-10-CM

## 2022-09-22 DIAGNOSIS — Z794 Long term (current) use of insulin: Secondary | ICD-10-CM | POA: Diagnosis not present

## 2022-09-22 DIAGNOSIS — R252 Cramp and spasm: Secondary | ICD-10-CM

## 2022-09-22 DIAGNOSIS — M1A09X0 Idiopathic chronic gout, multiple sites, without tophus (tophi): Secondary | ICD-10-CM

## 2022-09-22 DIAGNOSIS — I10 Essential (primary) hypertension: Secondary | ICD-10-CM

## 2022-09-22 DIAGNOSIS — M549 Dorsalgia, unspecified: Secondary | ICD-10-CM | POA: Diagnosis present

## 2022-09-22 MED ORDER — OXYCODONE-ACETAMINOPHEN 5-325 MG PO TABS: 1.0000 | ORAL_TABLET | ORAL | 0 refills | Status: DC | PRN

## 2022-09-22 MED ORDER — ALLOPURINOL 100 MG PO TABS
100.0000 mg | ORAL_TABLET | Freq: Two times a day (BID) | ORAL | 2 refills | Status: DC
Start: 2022-09-22 — End: 2023-01-03

## 2022-09-22 MED ORDER — PREDNISONE 20 MG PO TABS: 40.0000 mg | ORAL_TABLET | Freq: Every day | ORAL | 0 refills | Status: DC

## 2022-09-22 MED ORDER — TIZANIDINE HCL 4 MG PO TABS
4.0000 mg | ORAL_TABLET | Freq: Four times a day (QID) | ORAL | 0 refills | Status: DC | PRN
Start: 2022-09-22 — End: 2022-11-10

## 2022-09-22 MED ORDER — PREDNISONE 20 MG PO TABS
40.0000 mg | ORAL_TABLET | Freq: Once | ORAL | Status: AC
  Administered 2022-09-22: 40 mg via ORAL
  Filled 2022-09-22: qty 2

## 2022-09-22 MED ORDER — OXYCODONE-ACETAMINOPHEN 5-325 MG PO TABS
1.0000 | ORAL_TABLET | Freq: Once | ORAL | Status: AC
  Administered 2022-09-22: 1 via ORAL
  Filled 2022-09-22: qty 1

## 2022-09-22 NOTE — Discharge Instructions (Signed)
Follow-up with your primary care provider about possibly getting set up for physical therapy to help with your sciatica.  Do not start allopurinol until this flareup has completely resolved.  If you start allopurinol why you have a flareup, it will actually make it worse.

## 2022-09-22 NOTE — ED Provider Notes (Signed)
[place] EMERGENCY DEPARTMENT AT [facility] Provider Note   CSN: [id number] Arrival date & time: 09/22/22  0139     History  Chief Complaint  Patient presents with   Back Pain    Patrick [name] is a 57 y.o. male.  The history is provided by the patient.  Back Pain He has his 3 of diabetes, chronic kidney disease, gout and comes in complaining of pain in the fingers of his right hand over the last 2 days, typical of his gout flareups.  Today, he is also noted some pain in the fingers of his left hand.  He had run out of his allopurinol.  He is also complaining of a flareup of his chronic right sciatic pain.  He denies any recent trauma or unusual activity.   Home Medications Prior to Admission medications   Medication Sig Start Date End Date Taking? Authorizing Provider  allopurinol (ZYLOPRIM) 100 MG tablet Take 1 tablet (100 mg total) by mouth 2 (two) times daily. 04/27/22   [name], MD  amLODipine (NORVASC) 5 MG tablet Take 1 tablet (5 mg total) by mouth daily. 04/27/22 07/26/22  [name], MD  blood glucose meter kit and supplies KIT Dispense based on patient and insurance preference. Use up to four times daily as directed. 01/05/22   [name], MD  colchicine 0.6 MG tablet Take 2 tablets (1.2 mg) during a gout flareup, then take 1 tablet an hour later. 06/30/22   Pollyann Savoy, MD  doxycycline (VIBRA-TABS) 100 MG tablet Take 1 tablet (100 mg total) by mouth 2 (two) times daily. 08/14/22   Adonis Huguenin, NP  gabapentin (NEURONTIN) 300 MG capsule Take 1 capsule (300 mg total) by mouth 3 (three) times daily. Patient taking differently: Take 300 mg by mouth daily as needed (pain). 09/08/20   Angiulli, Mcarthur Rossetti, PA-C  HYDROcodone-acetaminophen (NORCO/VICODIN) 5-325 MG tablet Take 1 tablet by mouth every 6 (six) hours as needed. 07/06/22   Linwood Dibbles, MD  insulin aspart (NOVOLOG FLEXPEN) 100 UNIT/ML FlexPen Inject 4 Units into the skin 3  (three) times daily with meals. Patient not taking: Reported on 04/27/2022 09/08/20   Angiulli, Mcarthur Rossetti, PA-C  insulin glargine (LANTUS SOLOSTAR) 100 UNIT/ML Solostar Pen Inject 25 Units into the skin at bedtime. 04/27/22   [name], MD  Insulin Pen Needle 32G X 4 MM MISC Use as directed 09/08/20   [name], [name], MD  Insulin Pen Needle 32G X 4 MM MISC Use as directed 09/09/20   Angiulli, Mcarthur Rossetti, PA-C  JARDIANCE 25 MG TABS tablet Take 1 tablet (25 mg total) by mouth daily. 04/27/22   [name], MD  lidocaine (LIDODERM) 5 % Place 1 patch onto the skin daily. Remove & Discard patch within 12 hours or as directed by MD Patient not taking: Reported on 04/27/2022 01/15/22   Sherian Maroon A, PA  methylPREDNISolone (MEDROL DOSEPAK) 4 MG TBPK tablet Day 1: 8mg  before breakfast, 4 mg after lunch, 4 mg after supper, and 8 mg at bedtime Day 2: 4 mg before breakfast, 4 mg after lunch, 4 mg  after supper, and 8 mg  at bedtime Day 3:  4 mg  before breakfast, 4 mg  after lunch, 4 mg after supper, and 4 mg  at bedtime Day 4: 4 mg  before breakfast, 4 mg  after lunch, and 4 mg at bedtime Day 5: 4 mg  before breakfast and 4 mg at bedtime  Day 6: 4 mg  before breakfast 06/22/22   Melene Plan, DO  metoprolol tartrate (LOPRESSOR) 25 MG tablet Take 1 tablet (25 mg total) by mouth 2 (two) times daily. 04/27/22   [name], MD  oxyCODONE-acetaminophen (PERCOCET/ROXICET) 5-325 MG tablet Take 1 tablet by mouth every 4 (four) hours as needed for severe pain. 06/30/22   Pollyann Savoy, MD  OZEMPIC, 1 MG/DOSE, 4 MG/3ML SOPN Inject 1 mg into the skin once a week. 01/01/22   [provider]  predniSONE (DELTASONE) 10 MG tablet Take 2 tablets (20 mg total) by mouth daily with breakfast. 08/14/22   Adonis Huguenin, NP  simvastatin (ZOCOR) 10 MG tablet Take 1 tablet (10 mg total) by mouth at bedtime. 04/27/22   [name], MD  tiZANidine (ZANAFLEX) 4 MG tablet Take 1 tablet (4 mg  total) by mouth 2 (two) times daily as needed for muscle spasms. 04/27/22   [name], MD      Allergies    Patient has no known allergies.    Review of Systems   Review of Systems  Musculoskeletal:  Positive for back pain.  All other systems reviewed and are negative.   Physical Exam Updated Vital Signs BP (!) 173/105 (BP Location: Right Arm)   Pulse 85   Temp 98.4 F (36.9 C) (Oral)   Resp 18   Wt 131.5 kg   SpO2 99%   BMI 36.25 kg/m  Physical Exam Vitals and nursing note reviewed.   57 year old male, resting comfortably and in no acute distress. Vital signs are significant for elevated blood pressure. Oxygen saturation is 99%, which is normal. Head is normocephalic and atraumatic. PERRLA, EOMI. Oropharynx is clear. Neck is nontender and supple without adenopathy or JVD. Back is nontender and there is no CVA tenderness. Lungs are clear without rales, wheezes, or rhonchi. Chest is nontender. Heart has regular rate and rhythm without murmur. Abdomen is soft, flat, nontender. Extremities: Bilateral below the knee amputations.  There is moderate soft tissue swelling of the third and fourth fingers of the right hand with moderate tenderness. Skin is warm and dry without rash. Neurologic: Mental status is normal, cranial nerves are intact, moves all extremities equally.  ED Results / Procedures / Treatments    Procedures Procedures    Medications Ordered in ED Medications  oxyCODONE-acetaminophen (PERCOCET/ROXICET) 5-325 MG per tablet 1 tablet (has no administration in time range)  predniSONE (DELTASONE) tablet 40 mg (has no administration in time range)    ED Course/ Medical Decision Making/ A&P                                 Medical Decision Making  Bilateral hand pain consistent with gout flareup.  Exacerbation of chronic sciatica.  I have reviewed his past records, and he has multiple ED visits for both conditions.  I am discharging him with  prescription for prednisone, allopurinol, tizanidine, oxycodone-acetaminophen.  He is advised not to start allopurinol until his gout flareup has resolved.  He is to follow-up with PCP.  Final Clinical Impression(s) / ED Diagnoses Final diagnoses:  Exacerbation of gout  Sciatic pain, right  Elevated blood pressure reading with diagnosis of hypertension    Rx / DC Orders ED Discharge Orders          Ordered    allopurinol (ZYLOPRIM) 100 MG tablet  2 times daily  09/22/22 0205    oxyCODONE-acetaminophen (PERCOCET/ROXICET) 5-325 MG tablet  Every 4 hours PRN        09/22/22 0205    predniSONE (DELTASONE) 20 MG tablet  Daily with breakfast        09/22/22 0205    tiZANidine (ZANAFLEX) 4 MG tablet  Every 6 hours PRN        09/22/22 0205              Dione Booze, MD 09/22/22 0210

## 2022-09-22 NOTE — ED Triage Notes (Signed)
Pt presents for for gout pain in both hands (L>R), has missed doses of allopurinol.   Also would like help with pain control for chronic sciatica pain.   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

## 2022-09-22 NOTE — ED Notes (Signed)
 RN reviewed discharge instructions with pt. Pt verbalized understanding and had no further questions. VSS upon discharge.  

## 2022-11-10 ENCOUNTER — Encounter (HOSPITAL_BASED_OUTPATIENT_CLINIC_OR_DEPARTMENT_OTHER): Payer: Self-pay

## 2022-11-10 ENCOUNTER — Emergency Department (HOSPITAL_BASED_OUTPATIENT_CLINIC_OR_DEPARTMENT_OTHER)
Admission: EM | Admit: 2022-11-10 | Discharge: 2022-11-10 | Disposition: A | Discharge status: Home / Self Care | Payer: No Typology Code available for payment source | Attending: Emergency Medicine | Admitting: Emergency Medicine

## 2022-11-10 DIAGNOSIS — M10041 Idiopathic gout, right hand: Secondary | ICD-10-CM | POA: Diagnosis present

## 2022-11-10 DIAGNOSIS — E119 Type 2 diabetes mellitus without complications: Secondary | ICD-10-CM | POA: Diagnosis not present

## 2022-11-10 DIAGNOSIS — Z794 Long term (current) use of insulin: Secondary | ICD-10-CM | POA: Diagnosis not present

## 2022-11-10 DIAGNOSIS — M1A09X0 Idiopathic chronic gout, multiple sites, without tophus (tophi): Secondary | ICD-10-CM

## 2022-11-10 DIAGNOSIS — Z79899 Other long term (current) drug therapy: Secondary | ICD-10-CM | POA: Insufficient documentation

## 2022-11-10 DIAGNOSIS — I10 Essential (primary) hypertension: Secondary | ICD-10-CM

## 2022-11-10 DIAGNOSIS — R252 Cramp and spasm: Secondary | ICD-10-CM

## 2022-11-10 DIAGNOSIS — M109 Gout, unspecified: Secondary | ICD-10-CM

## 2022-11-10 MED ORDER — TIZANIDINE HCL 4 MG PO TABS: 4.0000 mg | ORAL_TABLET | Freq: Four times a day (QID) | ORAL | 0 refills | Status: DC | PRN

## 2022-11-10 MED ORDER — COLCHICINE 0.6 MG PO TABS: ORAL_TABLET | ORAL | 2 refills | Status: DC

## 2022-11-10 MED ORDER — COLCHICINE 0.6 MG PO TABS
0.6000 mg | ORAL_TABLET | Freq: Once | ORAL | Status: DC
  Administered 2022-11-10: 0.6 mg via ORAL
  Filled 2022-11-10: qty 1

## 2022-11-10 MED ORDER — OXYCODONE HCL 5 MG PO TABS
5.0000 mg | ORAL_TABLET | Freq: Once | ORAL | Status: AC
  Administered 2022-11-10: 5 mg via ORAL
  Filled 2022-11-10: qty 1

## 2022-11-10 MED ORDER — AMLODIPINE BESYLATE 5 MG PO TABS
5.0000 mg | ORAL_TABLET | Freq: Every day | ORAL | 0 refills | Status: DC
Start: 2022-11-10 — End: 2023-01-03

## 2022-11-10 MED ORDER — COLCHICINE 0.6 MG PO TABS
1.2000 mg | ORAL_TABLET | Freq: Once | ORAL | Status: AC
  Administered 2022-11-10: 0.6 mg via ORAL
  Filled 2022-11-10: qty 2

## 2022-11-10 NOTE — ED Triage Notes (Signed)
He c/o a "gout flare up" in bilat. Hands Right > L.

## 2022-11-10 NOTE — ED Provider Notes (Signed)
[facility] [name] AT [facility] Provider Note   CSN: [id number] Arrival date & time: 11/10/22  1627     History  Chief Complaint  Patient presents with   Gout    Patrick [name] is a 57 y.o. male.  Patient here with gout flare.  But then his right hand again same spot in the past.  Mostly in his right second third and fourth digits on the MCPs.  Denies any fever or chills.  He has history of diabetes.  Nothing makes it worse or better.  He does not have any colchicine left.  Usually takes narcotics as well.  Nothing makes it worse or better.  He denies any numbness weakness or trauma.  He gets a flare about once a month.  The history is provided by the patient.       Home Medications Prior to Admission medications   Medication Sig Start Date End Date Taking? Authorizing Provider  allopurinol (ZYLOPRIM) 100 MG tablet Take 1 tablet (100 mg total) by mouth 2 (two) times daily. Do not start until your gout flare-up has resolved 09/22/22   Dione Booze, MD  amLODipine (NORVASC) 5 MG tablet Take 1 tablet (5 mg total) by mouth daily. 04/27/22 07/26/22  [name], MD  blood glucose meter kit and supplies KIT Dispense based on patient and insurance preference. Use up to four times daily as directed. 01/05/22   [name], MD  colchicine 0.6 MG tablet Take 2 tablets (1.2 mg) during a gout flareup, then take 1 tablet an hour later. 11/10/22   Nayelly Laughman, DO  gabapentin (NEURONTIN) 300 MG capsule Take 1 capsule (300 mg total) by mouth 3 (three) times daily. Patient taking differently: Take 300 mg by mouth daily as needed (pain). 09/08/20   Angiulli, Mcarthur Rossetti, PA-C  insulin aspart (NOVOLOG FLEXPEN) 100 UNIT/ML FlexPen Inject 4 Units into the skin 3 (three) times daily with meals. Patient not taking: Reported on 04/27/2022 09/08/20   Angiulli, Mcarthur Rossetti, PA-C  insulin glargine (LANTUS SOLOSTAR) 100 UNIT/ML Solostar Pen Inject 25 Units into the skin at bedtime.  04/27/22   [name], MD  Insulin Pen Needle 32G X 4 MM MISC Use as directed 09/08/20   [name], [name], MD  Insulin Pen Needle 32G X 4 MM MISC Use as directed 09/09/20   Angiulli, Mcarthur Rossetti, PA-C  JARDIANCE 25 MG TABS tablet Take 1 tablet (25 mg total) by mouth daily. 04/27/22   [name], MD  lidocaine (LIDODERM) 5 % Place 1 patch onto the skin daily. Remove & Discard patch within 12 hours or as directed by MD Patient not taking: Reported on 04/27/2022 01/15/22   Sherian Maroon A, PA  metoprolol tartrate (LOPRESSOR) 25 MG tablet Take 1 tablet (25 mg total) by mouth 2 (two) times daily. 04/27/22   [name], MD  OZEMPIC, 1 MG/DOSE, 4 MG/3ML SOPN Inject 1 mg into the skin once a week. 01/01/22   [provider]  predniSONE (DELTASONE) 20 MG tablet Take 2 tablets (40 mg total) by mouth daily with breakfast. 09/22/22   Dione Booze, MD  simvastatin (ZOCOR) 10 MG tablet Take 1 tablet (10 mg total) by mouth at bedtime. 04/27/22   [name], MD  tiZANidine (ZANAFLEX) 4 MG tablet Take 1 tablet (4 mg total) by mouth every 6 (six) hours as needed for muscle spasms. 11/10/22   Virgina Norfolk, DO      Allergies  Patient has no known allergies.    Review of Systems   Review of Systems  Physical Exam Updated Vital Signs BP (!) 174/119 (BP Location: Left Arm)   Pulse 92   Temp 99.3 F (37.4 C) (Oral)   Resp 18   SpO2 99%  Physical Exam Vitals and nursing note reviewed.  Constitutional:      General: He is not in acute distress.    Appearance: He is well-developed. He is not ill-appearing.  HENT:     Head: Normocephalic and atraumatic.  Eyes:     Conjunctiva/sclera: Conjunctivae normal.     Pupils: Pupils are equal, round, and reactive to light.  Cardiovascular:     Rate and Rhythm: Normal rate and regular rhythm.     Pulses: Normal pulses.     Heart sounds: Normal heart sounds. No murmur heard. Pulmonary:     Effort: Pulmonary effort is  normal. No respiratory distress.     Breath sounds: Normal breath sounds.  Abdominal:     General: Abdomen is flat.     Palpations: Abdomen is soft.     Tenderness: There is no abdominal tenderness.  Musculoskeletal:        General: Tenderness present. No swelling.     Cervical back: Normal range of motion and neck supple.     Comments: Some tenderness and warmth to the second third and fourth MCPs on the right hand but good range of motion  Skin:    General: Skin is warm and dry.     Capillary Refill: Capillary refill takes less than 2 seconds.  Neurological:     General: No focal deficit present.     Mental Status: He is alert.  Psychiatric:        Mood and Affect: Mood normal.     ED Results / Procedures / Treatments   Labs (all labs ordered are listed, but only abnormal results are displayed) Labs Reviewed - No data to display  EKG None  Radiology No results found.  Procedures Procedures    Medications Ordered in ED Medications  colchicine tablet 1.2 mg (has no administration in time range)  oxyCODONE (Oxy IR/ROXICODONE) immediate release tablet 5 mg (5 mg Oral Given 11/10/22 1828)    ED Course/ Medical Decision Making/ A&P                                 Medical Decision Making Risk Prescription drug management.   [name] is here with gout flare in his right hand.  Unremarkable vitals.  No fever.  Well-appearing.  Exam is consistent with a gout flare.  He is got some redness and discomfort to the second third and fourth MCPs on the right.  Denies any trauma.  He is neurovascular neuromuscular intact.  Have no concern for septic joint.  Discussed good range of motion.  He usually uses colchicine but does not have prescription.  Will prescribe colchicine, Roxicodone and refill tenacity in for him.  He overall appears well.  He understands return precautions.  He understands that if he develops fever, worsening swelling inability to fully flex and extend his  fingers he should come back for reevaluation.  Recommend close follow-up with primary care doctor.  Discharged in good condition.  The dose of oxycodone and colchicine here in the ED prior to discharge.  This chart was dictated using voice recognition software.  Despite best efforts to  proofread,  errors can occur which can change the documentation meaning.         Final Clinical Impression(s) / ED Diagnoses Final diagnoses:  Acute gout of right hand, unspecified cause    Rx / DC Orders ED Discharge Orders          Ordered    tiZANidine (ZANAFLEX) 4 MG tablet  Every 6 hours PRN        11/10/22 1829    colchicine 0.6 MG tablet        11/10/22 1829              Virgina Norfolk, DO 11/10/22 1836

## 2022-11-10 NOTE — ED Notes (Signed)
RN reviewed discharge instructions with pt. Pt verbalized understanding and had no further questions

## 2022-11-10 NOTE — Discharge Instructions (Addendum)
You have been given your first dose of colchicine.  You can take next dose of colchicine around 8:00 tonight.  I have sent your refill of colchicine and denies anything and I have sent you in a prescription for narcotic pain medicine called Roxicodone for breakthrough pain.  Please return if symptoms worsen.  Follow-up with your primary care doctor.  Be careful with her oxycodone and denies anymedications are sedating.  Do not mix with alcohol or drugs or dangerous activities including driving.

## 2022-11-10 NOTE — ED Notes (Signed)
Curatolo DO made aware of pts BP. Per pt, he is noncompliant with BP meds but would like to restart them, needs them refilled. NAD noted at this time. Meds refilled by Curatolo DO, pt instructed to begin taking when he gets home

## 2022-12-15 ENCOUNTER — Emergency Department (HOSPITAL_BASED_OUTPATIENT_CLINIC_OR_DEPARTMENT_OTHER)
Admission: EM | Admit: 2022-12-15 | Discharge: 2022-12-16 | Disposition: A | Discharge status: Home / Self Care | Payer: No Typology Code available for payment source | Attending: Emergency Medicine | Admitting: Emergency Medicine

## 2022-12-15 ENCOUNTER — Other Ambulatory Visit: Payer: Self-pay

## 2022-12-15 ENCOUNTER — Encounter (HOSPITAL_BASED_OUTPATIENT_CLINIC_OR_DEPARTMENT_OTHER): Payer: Self-pay | Admitting: Emergency Medicine

## 2022-12-15 DIAGNOSIS — M2559 Pain in other specified joint: Secondary | ICD-10-CM | POA: Diagnosis not present

## 2022-12-15 DIAGNOSIS — M1A09X0 Idiopathic chronic gout, multiple sites, without tophus (tophi): Secondary | ICD-10-CM

## 2022-12-15 DIAGNOSIS — I129 Hypertensive chronic kidney disease with stage 1 through stage 4 chronic kidney disease, or unspecified chronic kidney disease: Secondary | ICD-10-CM | POA: Insufficient documentation

## 2022-12-15 DIAGNOSIS — N189 Chronic kidney disease, unspecified: Secondary | ICD-10-CM | POA: Insufficient documentation

## 2022-12-15 DIAGNOSIS — Z79899 Other long term (current) drug therapy: Secondary | ICD-10-CM | POA: Insufficient documentation

## 2022-12-15 DIAGNOSIS — Z794 Long term (current) use of insulin: Secondary | ICD-10-CM | POA: Insufficient documentation

## 2022-12-15 DIAGNOSIS — E1122 Type 2 diabetes mellitus with diabetic chronic kidney disease: Secondary | ICD-10-CM | POA: Insufficient documentation

## 2022-12-15 DIAGNOSIS — Z7984 Long term (current) use of oral hypoglycemic drugs: Secondary | ICD-10-CM | POA: Diagnosis not present

## 2022-12-15 DIAGNOSIS — M2550 Pain in unspecified joint: Secondary | ICD-10-CM | POA: Diagnosis present

## 2022-12-15 MED ORDER — PREDNISONE 50 MG PO TABS
50.0000 mg | ORAL_TABLET | Freq: Once | ORAL | Status: AC
  Administered 2022-12-16: 50 mg via ORAL
  Filled 2022-12-15: qty 1

## 2022-12-15 MED ORDER — HYDROCODONE-ACETAMINOPHEN 5-325 MG PO TABS
1.0000 | ORAL_TABLET | Freq: Once | ORAL | Status: AC
  Administered 2022-12-16: 1 via ORAL
  Filled 2022-12-15: qty 1

## 2022-12-15 MED ORDER — PREDNISONE 10 MG (21) PO TBPK: ORAL_TABLET | Freq: Every day | ORAL | 0 refills | Status: DC

## 2022-12-15 MED ORDER — OXYCODONE-ACETAMINOPHEN 5-325 MG PO TABS
1.0000 | ORAL_TABLET | Freq: Four times a day (QID) | ORAL | 0 refills | Status: DC | PRN
Start: 2022-12-15 — End: 2022-12-25

## 2022-12-15 NOTE — ED Triage Notes (Signed)
Reports joint pain in bilateral hand left shoulder and elbow. "Its the gout" Hx of gout. Started last week.

## 2022-12-15 NOTE — ED Provider Notes (Signed)
[place] EMERGENCY DEPARTMENT AT [facility] Provider Note   CSN: [id number] Arrival date & time: 12/15/22  1849     History  Chief Complaint  Patient presents with   Joint Pain    Patrick [name] is a 57 y.o. male history of diabetes, per patient well-controlled, CKD, bilateral BKA, recurrent gout, chronic hypertension, medication noncompliance here for evaluation of joint pain.  He has had pain to bilateral wrists and right metacarpals.  States this feels similar to his prior gout flares.  He denies any fever, nausea, vomiting, redness, warmth to the joints.  No numbness or weakness.  No chest pain or shortness of breath.  He states he was told he needed to follow-up with a rheumatologist previously however has not.  States typically when he runs out of his allopurinol and colchicine he gets "flares."  He does not member the last time he had this however he is seen in the emergency department quite frequently for "gout" flares.  He states that he has had prior arthrocentesis however was not told the results.  He is not followed by orthopedics.  Denies any recent falls or injuries  HPI     Home Medications Prior to Admission medications   Medication Sig Start Date End Date Taking? Authorizing Provider  allopurinol (ZYLOPRIM) 100 MG tablet Take 1 tablet (100 mg total) by mouth 2 (two) times daily. Do not start until your gout flare-up has resolved 09/22/22   Dione Booze, MD  amLODipine (NORVASC) 5 MG tablet Take 1 tablet (5 mg total) by mouth daily. 11/10/22 02/08/23  Curatolo, Adam, DO  blood glucose meter kit and supplies KIT Dispense based on patient and insurance preference. Use up to four times daily as directed. 01/05/22   [name], MD  colchicine 0.6 MG tablet Take 2 tablets (1.2 mg) during a gout flareup, then take 1 tablet an hour later. 11/10/22   Curatolo, Adam, DO  gabapentin (NEURONTIN) 300 MG capsule Take 1 capsule (300 mg total) by mouth 3 (three) times  daily. Patient taking differently: Take 300 mg by mouth daily as needed (pain). 09/08/20   Angiulli, Mcarthur Rossetti, PA-C  insulin aspart (NOVOLOG FLEXPEN) 100 UNIT/ML FlexPen Inject 4 Units into the skin 3 (three) times daily with meals. Patient not taking: Reported on 04/27/2022 09/08/20   Angiulli, Mcarthur Rossetti, PA-C  insulin glargine (LANTUS SOLOSTAR) 100 UNIT/ML Solostar Pen Inject 25 Units into the skin at bedtime. 04/27/22   [name], MD  Insulin Pen Needle 32G X 4 MM MISC Use as directed 09/08/20   [name], [name], MD  Insulin Pen Needle 32G X 4 MM MISC Use as directed 09/09/20   Angiulli, Mcarthur Rossetti, PA-C  JARDIANCE 25 MG TABS tablet Take 1 tablet (25 mg total) by mouth daily. 04/27/22   [name], MD  lidocaine (LIDODERM) 5 % Place 1 patch onto the skin daily. Remove & Discard patch within 12 hours or as directed by MD Patient not taking: Reported on 04/27/2022 01/15/22   Sherian Maroon A, PA  metoprolol tartrate (LOPRESSOR) 25 MG tablet Take 1 tablet (25 mg total) by mouth 2 (two) times daily. 04/27/22   [name], MD  oxyCODONE-acetaminophen (PERCOCET/ROXICET) 5-325 MG tablet Take 1 tablet by mouth every 6 (six) hours as needed for severe pain (pain score 7-10). 12/15/22  Yes Priyanka Causey A, PA-C  OZEMPIC, 1 MG/DOSE, 4 MG/3ML SOPN Inject 1 mg into the skin once a week. 01/01/22  [provider]  predniSONE (STERAPRED UNI-PAK 21 TAB) 10 MG (21) TBPK tablet Take by mouth daily. Take 6 tabs by mouth daily  for 1 days, then 5 tabs for 1 days, then 4 tabs for 1 days, then 3 tabs for 1 days, 2 tabs for 1 days, then 1 tab by mouth daily for 1 days 12/15/22  Yes Rawad Bochicchio A, PA-C  simvastatin (ZOCOR) 10 MG tablet Take 1 tablet (10 mg total) by mouth at bedtime. 04/27/22   [name], MD  tiZANidine (ZANAFLEX) 4 MG tablet Take 1 tablet (4 mg total) by mouth every 6 (six) hours as needed for muscle spasms. 11/10/22   Virgina Norfolk, DO       Allergies    Patient has no known allergies.    Review of Systems   Review of Systems  Constitutional: Negative.   HENT: Negative.    Respiratory: Negative.    Cardiovascular: Negative.   Gastrointestinal: Negative.   Genitourinary: Negative.   Musculoskeletal:        Bilateral wrist, right metacarpal, left elbow pain  Skin: Negative.   Neurological: Negative.   All other systems reviewed and are negative.   Physical Exam Updated Vital Signs BP (!) 169/112   Pulse 85   Temp 97.7 F (36.5 C)   Resp 16   Ht 6\' 3"  (1.905 m)   Wt 133.8 kg   SpO2 100%   BMI 36.87 kg/m  Physical Exam Vitals and nursing note reviewed.  Constitutional:      General: He is not in acute distress.    Appearance: He is well-developed. He is not ill-appearing or diaphoretic.  HENT:     Head: Atraumatic.  Eyes:     Pupils: Pupils are equal, round, and reactive to light.  Cardiovascular:     Rate and Rhythm: Normal rate and regular rhythm.     Pulses: Normal pulses.          Radial pulses are 2+ on the right side and 2+ on the left side.     Heart sounds: Normal heart sounds.  Pulmonary:     Effort: Pulmonary effort is normal. No respiratory distress.     Breath sounds: Normal breath sounds.  Abdominal:     General: There is no distension.     Palpations: Abdomen is soft.  Musculoskeletal:        General: Normal range of motion.     Cervical back: Normal range of motion and neck supple.     Comments: Bilateral BKA Tenderness about bilateral wrist able to pronate, supinate, flex and extend wrist.  Tenderness about right metacarpals with some mild overlying soft tissue swelling and erythema.  Tenderness about left olecranon, no overlying soft tissue swelling.  Lifts bilateral arms overhead without difficulty  Skin:    General: Skin is warm and dry.     Capillary Refill: Capillary refill takes less than 2 seconds.     Comments: No fluctuance, induration, rashes or lesions  Neurological:      General: No focal deficit present.     Mental Status: He is alert and oriented to person, place, and time.     ED Results / Procedures / Treatments   Labs (all labs ordered are listed, but only abnormal results are displayed) Labs Reviewed - No data to display  EKG None  Radiology No results found.  Procedures Procedures    Medications Ordered in ED Medications  HYDROcodone-acetaminophen (NORCO/VICODIN) 5-325 MG per tablet 1  tablet (has no administration in time range)  predniSONE (DELTASONE) tablet 50 mg (has no administration in time range)   ED Course/ Medical Decision Making/ A&P   57 year old medically complex patient here for evaluation of joint pain which she feels is consistent with prior gout flares.  No fever.  No recent falls or injuries.  No numbness or weakness.  No rashes or lesions.  He has pain to bilateral wrists, right metacarpal and left elbow.  He does have some mild soft tissue swelling about wrist as well as some swelling about his right metacarpal and erythema.  He was told previously he needed to follow-up with rheumatology however he is not.  States he has had a prior arthrocentesis during and ED visit however is unsure the results of this.  Do not feel he needs labs and imaging at this time.  Will treat for gout.  I had long discussion with patient to establish care with rheumatology given this appears to be acute on chronic problem for patient he seeks care in the emergency department for his pain.  Would likely benefit from outpatient follow-up to help control this.  He also has had elevated blood pressures here.  He is currently asymptomatic.  I low suspicion for hypertensive urgency or emergency.  Does appear on prior ED visit he is medication noncompliant.  Will have him follow-up outpatient for this as well, return for any worsening symptoms.   The patient has been appropriately medically screened and/or stabilized in the ED. I have low suspicion  for any other emergent medical condition which would require further screening, evaluation or treatment in the ED or require inpatient management.  Patient is hemodynamically stable and in no acute distress.  Patient able to ambulate in department prior to ED.  Evaluation does not show acute pathology that would require ongoing or additional emergent interventions while in the emergency department or further inpatient treatment.  I have discussed the diagnosis with the patient and answered all questions.  Pain is been managed while in the emergency department and patient has no further complaints prior to discharge.  Patient is comfortable with plan discussed in room and is stable for discharge at this time.  I have discussed strict return precautions for returning to the emergency department.  Patient was encouraged to follow-up with PCP/specialist refer to at discharge.                                 Medical Decision Making Amount and/or Complexity of Data Reviewed External Data Reviewed: labs, radiology and notes.  Risk OTC drugs. Prescription drug management. Decision regarding hospitalization. Diagnosis or treatment significantly limited by social determinants of health.         Final Clinical Impression(s) / ED Diagnoses Final diagnoses:  Pain in other joint    Rx / DC Orders ED Discharge Orders          Ordered    oxyCODONE-acetaminophen (PERCOCET/ROXICET) 5-325 MG tablet  Every 6 hours PRN        12/15/22 2346    predniSONE (STERAPRED UNI-PAK 21 TAB) 10 MG (21) TBPK tablet  Daily        12/15/22 2346              Aedon Deason A, PA-C 12/15/22 2354    Arby Barrette, MD 12/16/22 1501

## 2022-12-15 NOTE — Discharge Instructions (Addendum)
I have written you for medications to help with your gout flare.  You need to establish care with a primary care provider and a rheumatologist given the seems to be an acute on chronic issue  Your blood pressure is also elevated here.  You need to establish care with a primary care provider for this  Return for new or worsening symptoms

## 2022-12-24 ENCOUNTER — Other Ambulatory Visit: Payer: Self-pay

## 2022-12-24 DIAGNOSIS — X58XXXA Exposure to other specified factors, initial encounter: Secondary | ICD-10-CM | POA: Diagnosis not present

## 2022-12-24 DIAGNOSIS — Z8616 Personal history of COVID-19: Secondary | ICD-10-CM | POA: Insufficient documentation

## 2022-12-24 DIAGNOSIS — R519 Headache, unspecified: Secondary | ICD-10-CM | POA: Insufficient documentation

## 2022-12-24 DIAGNOSIS — M79641 Pain in right hand: Secondary | ICD-10-CM | POA: Diagnosis not present

## 2022-12-24 DIAGNOSIS — S0993XA Unspecified injury of face, initial encounter: Secondary | ICD-10-CM | POA: Insufficient documentation

## 2022-12-24 DIAGNOSIS — I10 Essential (primary) hypertension: Secondary | ICD-10-CM | POA: Insufficient documentation

## 2022-12-24 DIAGNOSIS — E119 Type 2 diabetes mellitus without complications: Secondary | ICD-10-CM | POA: Insufficient documentation
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
## 2022-12-25 ENCOUNTER — Emergency Department (HOSPITAL_COMMUNITY): Payer: No Typology Code available for payment source

## 2022-12-25 ENCOUNTER — Emergency Department (HOSPITAL_BASED_OUTPATIENT_CLINIC_OR_DEPARTMENT_OTHER)
Admission: EM | Admit: 2022-12-25 | Discharge: 2022-12-25 | Disposition: A | Discharge status: Home / Self Care | Payer: No Typology Code available for payment source | Attending: Emergency Medicine | Admitting: Emergency Medicine

## 2022-12-25 ENCOUNTER — Emergency Department (HOSPITAL_BASED_OUTPATIENT_CLINIC_OR_DEPARTMENT_OTHER): Payer: No Typology Code available for payment source | Admitting: Radiology

## 2022-12-25 ENCOUNTER — Encounter (HOSPITAL_COMMUNITY): Payer: Self-pay | Admitting: Emergency Medicine

## 2022-12-25 DIAGNOSIS — I10 Essential (primary) hypertension: Secondary | ICD-10-CM | POA: Diagnosis not present

## 2022-12-25 DIAGNOSIS — Z8616 Personal history of COVID-19: Secondary | ICD-10-CM | POA: Diagnosis not present

## 2022-12-25 DIAGNOSIS — E119 Type 2 diabetes mellitus without complications: Secondary | ICD-10-CM | POA: Diagnosis not present

## 2022-12-25 DIAGNOSIS — S0993XA Unspecified injury of face, initial encounter: Secondary | ICD-10-CM

## 2022-12-25 DIAGNOSIS — R519 Headache, unspecified: Secondary | ICD-10-CM | POA: Diagnosis not present

## 2022-12-25 DIAGNOSIS — M79641 Pain in right hand: Secondary | ICD-10-CM | POA: Diagnosis not present

## 2022-12-25 DIAGNOSIS — X58XXXA Exposure to other specified factors, initial encounter: Secondary | ICD-10-CM | POA: Diagnosis not present

## 2022-12-25 MED ORDER — HYDROCODONE-ACETAMINOPHEN 7.5-325 MG/15ML PO SOLN
10.0000 mL | Freq: Once | ORAL | Status: AC
  Administered 2022-12-25: 10 mL via ORAL
  Filled 2022-12-25: qty 15

## 2022-12-25 MED ORDER — FENTANYL CITRATE PF 50 MCG/ML IJ SOSY: 50.0000 ug | PREFILLED_SYRINGE | Freq: Once | INTRAMUSCULAR | Status: DC

## 2022-12-25 MED ORDER — CYCLOBENZAPRINE HCL 10 MG PO TABS: 10.0000 mg | ORAL_TABLET | Freq: Two times a day (BID) | ORAL | 0 refills | Status: DC | PRN

## 2022-12-25 MED ORDER — MORPHINE SULFATE (PF) 4 MG/ML IV SOLN
4.0000 mg | Freq: Once | INTRAVENOUS | Status: AC
  Administered 2022-12-25: 4 mg via INTRAMUSCULAR
  Filled 2022-12-25: qty 1

## 2022-12-25 MED ORDER — HYDROCODONE-ACETAMINOPHEN 5-325 MG PO TABS: 2.0000 | ORAL_TABLET | ORAL | 0 refills | Status: DC | PRN
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
## 2022-12-25 NOTE — ED Notes (Signed)
Attempted to ambulate patient x2. Pt states he does not want to ambulate at this time

## 2022-12-25 NOTE — ED Notes (Signed)
RN aware of high BP made MD aware also.
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
## 2022-12-25 NOTE — ED Notes (Signed)
[facility] here to transport patient to [facility] ED.

## 2022-12-29 ENCOUNTER — Emergency Department (HOSPITAL_COMMUNITY): Payer: No Typology Code available for payment source

## 2022-12-29 ENCOUNTER — Observation Stay (HOSPITAL_COMMUNITY): Payer: No Typology Code available for payment source

## 2022-12-29 ENCOUNTER — Other Ambulatory Visit: Payer: Self-pay

## 2022-12-29 ENCOUNTER — Inpatient Hospital Stay (HOSPITAL_COMMUNITY)
Admission: EM | Admit: 2022-12-29 | Discharge: 2023-01-03 | DRG: 674 | Disposition: A | Discharge status: Home / Self Care | Payer: No Typology Code available for payment source | Attending: Internal Medicine | Admitting: Internal Medicine

## 2022-12-29 ENCOUNTER — Encounter (HOSPITAL_COMMUNITY): Payer: Self-pay

## 2022-12-29 DIAGNOSIS — I120 Hypertensive chronic kidney disease with stage 5 chronic kidney disease or end stage renal disease: Secondary | ICD-10-CM | POA: Diagnosis present

## 2022-12-29 DIAGNOSIS — E1165 Type 2 diabetes mellitus with hyperglycemia: Secondary | ICD-10-CM

## 2022-12-29 DIAGNOSIS — Z8739 Personal history of other diseases of the musculoskeletal system and connective tissue: Secondary | ICD-10-CM

## 2022-12-29 DIAGNOSIS — D649 Anemia, unspecified: Secondary | ICD-10-CM

## 2022-12-29 DIAGNOSIS — M109 Gout, unspecified: Secondary | ICD-10-CM | POA: Diagnosis present

## 2022-12-29 DIAGNOSIS — Z8673 Personal history of transient ischemic attack (TIA), and cerebral infarction without residual deficits: Secondary | ICD-10-CM

## 2022-12-29 DIAGNOSIS — E7800 Pure hypercholesterolemia, unspecified: Secondary | ICD-10-CM | POA: Diagnosis not present

## 2022-12-29 DIAGNOSIS — N183 Chronic kidney disease, stage 3 unspecified: Secondary | ICD-10-CM | POA: Diagnosis present

## 2022-12-29 DIAGNOSIS — Z79899 Other long term (current) drug therapy: Secondary | ICD-10-CM

## 2022-12-29 DIAGNOSIS — N179 Acute kidney failure, unspecified: Principal | ICD-10-CM

## 2022-12-29 DIAGNOSIS — N1832 Chronic kidney disease, stage 3b: Secondary | ICD-10-CM | POA: Diagnosis present

## 2022-12-29 DIAGNOSIS — M10051 Idiopathic gout, right hip: Secondary | ICD-10-CM

## 2022-12-29 DIAGNOSIS — R079 Chest pain, unspecified: Secondary | ICD-10-CM | POA: Diagnosis not present

## 2022-12-29 DIAGNOSIS — Z89512 Acquired absence of left leg below knee: Secondary | ICD-10-CM

## 2022-12-29 DIAGNOSIS — Z7985 Long-term (current) use of injectable non-insulin antidiabetic drugs: Secondary | ICD-10-CM

## 2022-12-29 DIAGNOSIS — Z8616 Personal history of COVID-19: Secondary | ICD-10-CM

## 2022-12-29 DIAGNOSIS — Z7984 Long term (current) use of oral hypoglycemic drugs: Secondary | ICD-10-CM

## 2022-12-29 DIAGNOSIS — I2489 Other forms of acute ischemic heart disease: Secondary | ICD-10-CM | POA: Diagnosis present

## 2022-12-29 DIAGNOSIS — Z8619 Personal history of other infectious and parasitic diseases: Secondary | ICD-10-CM

## 2022-12-29 DIAGNOSIS — Z89511 Acquired absence of right leg below knee: Secondary | ICD-10-CM

## 2022-12-29 DIAGNOSIS — E669 Obesity, unspecified: Secondary | ICD-10-CM

## 2022-12-29 DIAGNOSIS — Z794 Long term (current) use of insulin: Secondary | ICD-10-CM

## 2022-12-29 DIAGNOSIS — M1A9XX0 Chronic gout, unspecified, without tophus (tophi): Secondary | ICD-10-CM | POA: Diagnosis present

## 2022-12-29 DIAGNOSIS — N186 End stage renal disease: Secondary | ICD-10-CM | POA: Diagnosis present

## 2022-12-29 DIAGNOSIS — N2581 Secondary hyperparathyroidism of renal origin: Secondary | ICD-10-CM | POA: Diagnosis present

## 2022-12-29 DIAGNOSIS — I214 Non-ST elevation (NSTEMI) myocardial infarction: Secondary | ICD-10-CM

## 2022-12-29 DIAGNOSIS — I10 Essential (primary) hypertension: Secondary | ICD-10-CM

## 2022-12-29 DIAGNOSIS — E1151 Type 2 diabetes mellitus with diabetic peripheral angiopathy without gangrene: Secondary | ICD-10-CM | POA: Diagnosis present

## 2022-12-29 DIAGNOSIS — Y92008 Other place in unspecified non-institutional (private) residence as the place of occurrence of the external cause: Secondary | ICD-10-CM

## 2022-12-29 DIAGNOSIS — E1122 Type 2 diabetes mellitus with diabetic chronic kidney disease: Secondary | ICD-10-CM | POA: Diagnosis present

## 2022-12-29 DIAGNOSIS — D72829 Elevated white blood cell count, unspecified: Secondary | ICD-10-CM | POA: Diagnosis present

## 2022-12-29 DIAGNOSIS — D631 Anemia in chronic kidney disease: Secondary | ICD-10-CM | POA: Diagnosis present

## 2022-12-29 DIAGNOSIS — Z6841 Body Mass Index (BMI) 40.0 and over, adult: Secondary | ICD-10-CM

## 2022-12-29 DIAGNOSIS — I509 Heart failure, unspecified: Secondary | ICD-10-CM

## 2022-12-29 DIAGNOSIS — E872 Acidosis, unspecified: Secondary | ICD-10-CM | POA: Diagnosis present

## 2022-12-29 DIAGNOSIS — Z992 Dependence on renal dialysis: Secondary | ICD-10-CM

## 2022-12-29 HISTORY — DX: Subacute osteomyelitis, left ankle and foot: M86.272

## 2022-12-29 HISTORY — DX: Osteomyelitis, unspecified: M86.9

## 2022-12-29 LAB — COMPREHENSIVE METABOLIC PANEL
ALT: 13 U/L (ref 0–44)
AST: 13 U/L — ABNORMAL LOW (ref 15–41)
Albumin: 2.9 g/dL — ABNORMAL LOW (ref 3.5–5.0)
Alkaline Phosphatase: 73 U/L (ref 38–126)
Anion gap: 14 (ref 5–15)
BUN: 68 mg/dL — ABNORMAL HIGH (ref 6–20)
CO2: 13 mmol/L — ABNORMAL LOW (ref 22–32)
Calcium: 5.6 mg/dL — CL (ref 8.9–10.3)
Chloride: 112 mmol/L — ABNORMAL HIGH (ref 98–111)
Creatinine, Ser: 7.5 mg/dL — ABNORMAL HIGH (ref 0.61–1.24)
GFR, Estimated: 8 mL/min — ABNORMAL LOW (ref >60)
Glucose, Bld: 106 mg/dL — ABNORMAL HIGH (ref 70–99)
Potassium: 4.9 mmol/L (ref 3.5–5.1)
Sodium: 139 mmol/L (ref 135–145)
Total Bilirubin: 0.8 mg/dL (ref <1.2)
Total Protein: 6.3 g/dL — ABNORMAL LOW (ref 6.5–8.1)

## 2022-12-29 LAB — URINALYSIS, ROUTINE W REFLEX MICROSCOPIC
Bacteria, UA: NONE SEEN
Bilirubin Urine: NEGATIVE
Glucose, UA: NEGATIVE mg/dL
Ketones, ur: NEGATIVE mg/dL
Leukocytes,Ua: NEGATIVE
Nitrite: NEGATIVE
Protein, ur: 100 mg/dL — AB
Specific Gravity, Urine: 1.006 (ref 1.005–1.030)
pH: 5 (ref 5.0–8.0)

## 2022-12-29 LAB — CBC WITH DIFFERENTIAL/PLATELET
Abs Immature Granulocytes: 0.08 10*3/uL — ABNORMAL HIGH (ref 0.00–0.07)
Basophils Absolute: 0.1 10*3/uL (ref 0.0–0.1)
Basophils Relative: 1 %
Eosinophils Absolute: 0.1 10*3/uL (ref 0.0–0.5)
Eosinophils Relative: 1 %
HCT: 27.7 % — ABNORMAL LOW (ref 39.0–52.0)
Hemoglobin: 8.5 g/dL — ABNORMAL LOW (ref 13.0–17.0)
Immature Granulocytes: 1 %
Lymphocytes Relative: 10 %
Lymphs Abs: 1.4 10*3/uL (ref 0.7–4.0)
MCH: 27.6 pg (ref 26.0–34.0)
MCHC: 30.7 g/dL (ref 30.0–36.0)
MCV: 89.9 fL (ref 80.0–100.0)
Monocytes Absolute: 1 10*3/uL (ref 0.1–1.0)
Monocytes Relative: 7 %
Neutro Abs: 11.5 10*3/uL — ABNORMAL HIGH (ref 1.7–7.7)
Neutrophils Relative %: 80 %
Platelets: 393 10*3/uL (ref 150–400)
RBC: 3.08 MIL/uL — ABNORMAL LOW (ref 4.22–5.81)
RDW: 14.6 % (ref 11.5–15.5)
WBC: 14.2 10*3/uL — ABNORMAL HIGH (ref 4.0–10.5)
nRBC: 0 % (ref 0.0–0.2)

## 2022-12-29 LAB — RENAL FUNCTION PANEL
Albumin: 2.9 g/dL — ABNORMAL LOW (ref 3.5–5.0)
Anion gap: 12 (ref 5–15)
BUN: 68 mg/dL — ABNORMAL HIGH (ref 6–20)
CO2: 16 mmol/L — ABNORMAL LOW (ref 22–32)
Calcium: 5.8 mg/dL — CL (ref 8.9–10.3)
Chloride: 111 mmol/L (ref 98–111)
Creatinine, Ser: 7.39 mg/dL — ABNORMAL HIGH (ref 0.61–1.24)
GFR, Estimated: 8 mL/min — ABNORMAL LOW (ref >60)
Glucose, Bld: 105 mg/dL — ABNORMAL HIGH (ref 70–99)
Phosphorus: 6.4 mg/dL — ABNORMAL HIGH (ref 2.5–4.6)
Potassium: 4.9 mmol/L (ref 3.5–5.1)
Sodium: 139 mmol/L (ref 135–145)

## 2022-12-29 LAB — FERRITIN: Ferritin: 402 ng/mL — ABNORMAL HIGH (ref 24–336)

## 2022-12-29 LAB — IRON AND TIBC
Iron: 10 ug/dL — ABNORMAL LOW (ref 45–182)
Saturation Ratios: 6 % — ABNORMAL LOW (ref 17.9–39.5)
TIBC: 157 ug/dL — ABNORMAL LOW (ref 250–450)
UIBC: 147 ug/dL

## 2022-12-29 LAB — TROPONIN I (HIGH SENSITIVITY)
Troponin I (High Sensitivity): 556 ng/L (ref <18)
Troponin I (High Sensitivity): 533 ng/L (ref <18)

## 2022-12-29 LAB — GLUCOSE, CAPILLARY: Glucose-Capillary: 151 mg/dL — ABNORMAL HIGH (ref 70–99)

## 2022-12-29 LAB — BRAIN NATRIURETIC PEPTIDE: B Natriuretic Peptide: 1535.5 pg/mL — ABNORMAL HIGH (ref 0.0–100.0)

## 2022-12-29 LAB — HEMOGLOBIN A1C
Hgb A1c MFr Bld: 6.8 % — ABNORMAL HIGH (ref 4.8–5.6)
Mean Plasma Glucose: 148.46 mg/dL

## 2022-12-29 LAB — MAGNESIUM: Magnesium: 1.1 mg/dL — ABNORMAL LOW (ref 1.7–2.4)

## 2022-12-29 MED ORDER — SIMVASTATIN 20 MG PO TABS
10.0000 mg | ORAL_TABLET | Freq: Every day | ORAL | Status: DC
  Administered 2022-12-29 – 2023-01-02 (×5): 10 mg via ORAL
  Filled 2022-12-29 (×5): qty 1

## 2022-12-29 MED ORDER — ASPIRIN 81 MG PO CHEW
324.0000 mg | CHEWABLE_TABLET | Freq: Once | ORAL | Status: AC
  Administered 2022-12-29: 324 mg via ORAL
  Filled 2022-12-29: qty 4

## 2022-12-29 MED ORDER — INSULIN GLARGINE-YFGN 100 UNIT/ML ~~LOC~~ SOLN
15.0000 [IU] | Freq: Every day | SUBCUTANEOUS | Status: DC
  Administered 2022-12-30 – 2023-01-03 (×5): 15 [IU] via SUBCUTANEOUS
  Filled 2022-12-29 (×5): qty 0.15

## 2022-12-29 MED ORDER — HEPARIN SODIUM (PORCINE) 5000 UNIT/ML IJ SOLN
5000.0000 [IU] | Freq: Three times a day (TID) | INTRAMUSCULAR | Status: DC
  Administered 2022-12-29 – 2023-01-03 (×13): 5000 [IU] via SUBCUTANEOUS
  Filled 2022-12-29 (×14): qty 1

## 2022-12-29 MED ORDER — ACETAMINOPHEN 650 MG RE SUPP: 650.0000 mg | Freq: Four times a day (QID) | RECTAL | Status: DC | PRN

## 2022-12-29 MED ORDER — ALBUTEROL SULFATE (2.5 MG/3ML) 0.083% IN NEBU: 2.5000 mg | INHALATION_SOLUTION | RESPIRATORY_TRACT | Status: DC | PRN

## 2022-12-29 MED ORDER — NITROGLYCERIN 0.4 MG SL SUBL
0.4000 mg | SUBLINGUAL_TABLET | SUBLINGUAL | Status: DC | PRN
  Administered 2022-12-29: 0.4 mg via SUBLINGUAL
  Filled 2022-12-29: qty 1

## 2022-12-29 MED ORDER — FUROSEMIDE 10 MG/ML IJ SOLN
120.0000 mg | Freq: Once | INTRAVENOUS | Status: AC
  Administered 2022-12-29: 120 mg via INTRAVENOUS
  Filled 2022-12-29: qty 10

## 2022-12-29 MED ORDER — FUROSEMIDE 10 MG/ML IJ SOLN
120.0000 mg | Freq: Two times a day (BID) | INTRAVENOUS | Status: DC
  Administered 2022-12-30 – 2022-12-31 (×3): 120 mg via INTRAVENOUS
  Filled 2022-12-29: qty 10
  Filled 2022-12-29: qty 2
  Filled 2022-12-29 (×3): qty 12
  Filled 2022-12-29: qty 10

## 2022-12-29 MED ORDER — ALBUTEROL SULFATE HFA 108 (90 BASE) MCG/ACT IN AERS
2.0000 | INHALATION_SPRAY | RESPIRATORY_TRACT | Status: DC | PRN
  Filled 2022-12-29: qty 6.7

## 2022-12-29 MED ORDER — SODIUM CHLORIDE 0.9% FLUSH
3.0000 mL | Freq: Two times a day (BID) | INTRAVENOUS | Status: DC
  Administered 2022-12-29 – 2023-01-03 (×9): 3 mL via INTRAVENOUS

## 2022-12-29 MED ORDER — SODIUM BICARBONATE 650 MG PO TABS
1300.0000 mg | ORAL_TABLET | Freq: Two times a day (BID) | ORAL | Status: DC
  Administered 2022-12-29 – 2023-01-03 (×10): 1300 mg via ORAL
  Filled 2022-12-29 (×10): qty 2

## 2022-12-29 MED ORDER — CALCITRIOL 0.5 MCG PO CAPS
0.5000 ug | ORAL_CAPSULE | Freq: Every day | ORAL | Status: DC
  Administered 2022-12-29 – 2023-01-03 (×6): 0.5 ug via ORAL
  Filled 2022-12-29 (×6): qty 1

## 2022-12-29 MED ORDER — METOPROLOL TARTRATE 25 MG PO TABS
25.0000 mg | ORAL_TABLET | Freq: Two times a day (BID) | ORAL | Status: DC
  Administered 2022-12-29 – 2023-01-03 (×10): 25 mg via ORAL
  Filled 2022-12-29 (×10): qty 1

## 2022-12-29 MED ORDER — GABAPENTIN 300 MG PO CAPS
300.0000 mg | ORAL_CAPSULE | Freq: Every day | ORAL | Status: DC | PRN
  Administered 2022-12-29: 300 mg via ORAL
  Filled 2022-12-29: qty 1

## 2022-12-29 MED ORDER — INSULIN ASPART 100 UNIT/ML IJ SOLN
0.0000 [IU] | Freq: Three times a day (TID) | INTRAMUSCULAR | Status: DC
  Administered 2022-12-30 – 2022-12-31 (×4): 3 [IU] via SUBCUTANEOUS
  Administered 2022-12-31: 5 [IU] via SUBCUTANEOUS
  Administered 2022-12-31: 3 [IU] via SUBCUTANEOUS

## 2022-12-29 MED ORDER — PREDNISONE 20 MG PO TABS
20.0000 mg | ORAL_TABLET | Freq: Two times a day (BID) | ORAL | Status: DC
  Administered 2022-12-29 – 2023-01-03 (×10): 20 mg via ORAL
  Filled 2022-12-29 (×10): qty 1

## 2022-12-29 MED ORDER — POLYETHYLENE GLYCOL 3350 17 G PO PACK: 17.0000 g | PACK | Freq: Every day | ORAL | Status: DC | PRN

## 2022-12-29 MED ORDER — ACETAMINOPHEN 325 MG PO TABS: 650.0000 mg | ORAL_TABLET | Freq: Four times a day (QID) | ORAL | Status: DC | PRN

## 2022-12-29 MED ORDER — ONDANSETRON HCL 4 MG/2ML IJ SOLN
4.0000 mg | Freq: Once | INTRAMUSCULAR | Status: AC
  Administered 2022-12-29: 4 mg via INTRAVENOUS
  Filled 2022-12-29: qty 2

## 2022-12-29 MED ORDER — HYDROMORPHONE HCL 1 MG/ML IJ SOLN
1.0000 mg | Freq: Once | INTRAMUSCULAR | Status: AC
  Administered 2022-12-29: 1 mg via INTRAVENOUS
  Filled 2022-12-29: qty 1

## 2022-12-29 MED ORDER — AMLODIPINE BESYLATE 5 MG PO TABS
5.0000 mg | ORAL_TABLET | Freq: Every day | ORAL | Status: DC
  Administered 2022-12-30 – 2023-01-03 (×5): 5 mg via ORAL
  Filled 2022-12-29 (×5): qty 1

## 2022-12-29 MED ORDER — CALCIUM GLUCONATE-NACL 1-0.675 GM/50ML-% IV SOLN
1.0000 g | Freq: Once | INTRAVENOUS | Status: AC
Start: 2022-12-29 — End: 2022-12-29
  Administered 2022-12-29: 1000 mg via INTRAVENOUS
  Filled 2022-12-29: qty 50

## 2022-12-29 NOTE — ED Notes (Signed)
I called the floor  waiting for transport  rn accepted the pt
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
## 2022-12-29 NOTE — ED Notes (Signed)
Pain not much better 

## 2022-12-29 NOTE — Consult Note (Signed)
Nephrology Consult   Requesting provider: Beola Cord Service requesting consult: Hospitalist Reason for consult: CKD V   Assessment/Recommendations: Patrick [name] is a/an 57 y.o. male with a past medical history HTN, HLD, DM2, CKD 4, gout, bilateral BKA, CVA who present w/ shortness of breath, elevated troponin, and worsening CKD   CKD 5: Possibly some AKI component from NSAID use but more likely progressive CKD.  Creatinine was 4.7 in May and creatinine is now 7.5.  This rate of progression would be similar to what he has seen in the past. -Treatment of shortness of breath and NSTEMI as below -We discussed that he may require dialysis during this hospitalization -Fortunately no uremia -Continue to monitor daily Cr, Dose meds for GFR -Monitor Daily I/Os, Daily weight  -Maintain MAP>65 for optimal renal perfusion.  -Avoid nephrotoxic medications including NSAIDs -Use synthetic opioids (Fentanyl/Dilaudid) if needed -Check Renal U/S to rule out obstruction -Currently no indication for HD  Metabolic acidosis: Associated with CKD.  Start sodium bicarbonate 1300 mg twice daily  Hypocalcemia: Likely related to secondary hyperparathyroidism with CKD.  Will start calcitriol and obtain a PTH  Anemia: Likely multifactorial.  Will obtain iron studies.  CKD contributing.  Shortness of breath: Likely some volume overload despite reassuring chest x-ray.  IV Lasix x 1 tonight.  Continue to monitor tomorrow  NSTEMI: Cardiology feels it is demand.  Appreciate their help Hypertension:    Recommendations conveyed to primary service.    Darnell Level [facility] 12/29/2022 7:42 PM   _____________________________________________________________________________________ CC: Chest tightness  History of Present Illness: Patrick [name] is a/an 57 y.o. male with a past medical history of HTN, HLD, DM2, CKD 4, gout, bilateral BKA, CVA who presents with shortness of breath/chest  tightness.  The patient states he was feeling in his usual health a couple days ago but started having worsening chest tightness and shortness of breath that led to him coming to the emergency department.  He does not describe it as chest pressure but instead chest tightness and felt like he could not take a deep breath.  Some wheezing.  Also dyspnea on exertion.  He has been having problems with gout and has been taking colchicine and intermittently takes ibuprofen a few times a week.  He denies any fevers, chills, nausea, vomiting, dysuria, hematuria.  Feels like he has been urinating normally.  In the emergency department his chest x-ray was without major concern but there was concern on exam that he had some pulmonary edema.  Also appeared short of breath.  Was given IV Lasix 120 mg.  Creatinine was seen to be 6.  Patient has been seen in our office by Dr. Marisue Humble but did not follow-up within the past year.  Has had progressive diabetic kidney disease.   Medications:  Current Facility-Administered Medications  Medication Dose Route Frequency Provider Last Rate Last Admin   acetaminophen (TYLENOL) tablet 650 mg  650 mg Oral Q6H PRN Synetta Fail, MD       Or   acetaminophen (TYLENOL) suppository 650 mg  650 mg Rectal Q6H PRN Synetta Fail, MD       albuterol (VENTOLIN HFA) 108 (90 Base) MCG/ACT inhaler 2 puff  2 puff Inhalation Q4H PRN Synetta Fail, MD       [START ON 12/30/2022] amLODipine (NORVASC) tablet 5 mg  5 mg Oral Daily Synetta Fail, MD       calcium gluconate 1 g/ 50 mL sodium chloride IVPB  1 g Intravenous Once Synetta Fail, MD       [START ON 12/30/2022] furosemide (LASIX) 120 mg in dextrose 5 % 50 mL IVPB  120 mg Intravenous BID Synetta Fail, MD       gabapentin (NEURONTIN) capsule 300 mg  300 mg Oral Daily PRN Synetta Fail, MD       heparin injection 5,000 Units  5,000 Units Subcutaneous Q8H Synetta Fail, MD       [START ON  12/30/2022] insulin aspart (novoLOG) injection 0-15 Units  0-15 Units Subcutaneous TID WC Synetta Fail, MD       [START ON 12/30/2022] insulin glargine-yfgn [facility]) injection 15 Units  15 Units Subcutaneous Daily Synetta Fail, MD       metoprolol tartrate (LOPRESSOR) tablet 25 mg  25 mg Oral BID Synetta Fail, MD       nitroGLYCERIN (NITROSTAT) SL tablet 0.4 mg  0.4 mg Sublingual Q5 min PRN Synetta Fail, MD   0.4 mg at 12/29/22 1739   polyethylene glycol (MIRALAX / GLYCOLAX) packet 17 g  17 g Oral Daily PRN Synetta Fail, MD       predniSONE (DELTASONE) tablet 20 mg  20 mg Oral BID WC Synetta Fail, MD       simvastatin (ZOCOR) tablet 10 mg  10 mg Oral QHS Synetta Fail, MD       sodium chloride flush (NS) 0.9 % injection 3 mL  3 mL Intravenous Q12H Synetta Fail, MD       Facility-Administered Medications Ordered in Other Encounters  Medication Dose Route Frequency Provider Last Rate Last Admin   ketamine (KETALAR) bolus via infusion 68.05 mg  0.5 mg/kg Intravenous Once Nadara Mustard, MD         ALLERGIES Patient has no known allergies.  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/20/2020   History of COVID-19 04/2019   Hypertension    Hypertensive urgency 09/25/2020   Hypoglycemia 01/03/2022   Osteomyelitis of right foot (HCC)    PVD (peripheral vascular disease) (HCC)    s/p R BKA   Right arm cellulitis 01/09/2022   Sepsis due to cellulitis (HCC) 06/04/2020   Subacute osteomyelitis, left ankle and foot (HCC)      SOCIAL  HISTORY Social History   Socioeconomic History   Marital status: Divorced    Spouse name: Not on file   Number of children: Not on file   Years of education: Not on file   Highest education level: Not on file  Occupational History   Occupation: logistics  Tobacco Use   Smoking status: Never    Passive exposure: Never   Smokeless tobacco: Never  Vaping Use   Vaping status: Never Used  Substance and Sexual Activity   Alcohol use: No   Drug use: No   Sexual activity: Not Currently  Other Topics Concern   Not on file  Social History Narrative   Not on file   Social Determinants of Health   Financial Resource Strain:  Not on file  Food Insecurity: No Food Insecurity (01/11/2022)   Hunger Vital Sign    Worried About Running Out of Food in the Last Year: Never true    Ran Out of Food in the Last Year: Never true  Transportation Needs: No Transportation Needs (01/11/2022)   PRAPARE - Administrator, Civil Service (Medical): No    Lack of Transportation (Non-Medical): No  Physical Activity: Not on file  Stress: Not on file  Social Connections: Unknown (06/30/2021)   Received from [facility]   Social Network    Social Network: Not on file  Intimate Partner Violence: Not At Risk (01/11/2022)   Humiliation, Afraid, Rape, and Kick questionnaire    Fear of Current or Ex-Partner: No    Emotionally Abused: No    Physically Abused: No    Sexually Abused: No     FAMILY HISTORY Family History  Problem Relation Age of Onset   Diabetes Neg Hx       Review of Systems: 12 systems reviewed Otherwise as per HPI, all other systems reviewed and negative  Physical Exam: Vitals:   12/29/22 1730 12/29/22 1735  BP: (!) 163/119   Pulse: (!) 104 99  Resp: (!) 24 (!) 21  Temp:  (!) 97.4 F (36.3 C)  SpO2: 100% 100%   No intake/output data recorded.  Intake/Output Summary (Last 24 hours) at 12/29/2022 1942 Last data filed at 12/29/2022 1715 Gross per  24 hour  Intake --  Output 550 ml  Net -550 ml   General: well-appearing, no acute distress HEENT: anicteric sclera, oropharynx clear without lesions CV: Tachycardic, no rub Lungs: Mild crackles at the bases, mild increased work of breathing Abd: soft, non-tender, non-distended Skin: no visible lesions or rashes Psych: alert, engaged, appropriate mood and affect Musculoskeletal: Bilateral BKA Neuro: normal speech, no gross focal deficits   Test Results Reviewed Lab Results  Component Value Date   NA 139 12/29/2022   K 4.9 12/29/2022   CL 112 (H) 12/29/2022   CO2 13 (L) 12/29/2022   BUN 68 (H) 12/29/2022   CREATININE 7.50 (H) 12/29/2022   CALCIUM 5.6 (LL) 12/29/2022   ALBUMIN 2.9 (L) 12/29/2022    CBC Recent Labs  Lab 12/29/22 1516  WBC 14.2*  NEUTROABS 11.5*  HGB 8.5*  HCT 27.7*  MCV 89.9  PLT 393    I have reviewed all relevant outside healthcare records related to the patient's current hospitalization
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
## 2022-12-30 ENCOUNTER — Observation Stay (HOSPITAL_COMMUNITY): Payer: No Typology Code available for payment source

## 2022-12-30 DIAGNOSIS — D72829 Elevated white blood cell count, unspecified: Secondary | ICD-10-CM | POA: Diagnosis present

## 2022-12-30 DIAGNOSIS — E872 Acidosis, unspecified: Secondary | ICD-10-CM | POA: Diagnosis present

## 2022-12-30 DIAGNOSIS — Z79899 Other long term (current) drug therapy: Secondary | ICD-10-CM | POA: Diagnosis not present

## 2022-12-30 DIAGNOSIS — I120 Hypertensive chronic kidney disease with stage 5 chronic kidney disease or end stage renal disease: Secondary | ICD-10-CM | POA: Diagnosis present

## 2022-12-30 DIAGNOSIS — Z992 Dependence on renal dialysis: Secondary | ICD-10-CM | POA: Diagnosis not present

## 2022-12-30 DIAGNOSIS — Z8673 Personal history of transient ischemic attack (TIA), and cerebral infarction without residual deficits: Secondary | ICD-10-CM | POA: Diagnosis not present

## 2022-12-30 DIAGNOSIS — N1832 Chronic kidney disease, stage 3b: Secondary | ICD-10-CM | POA: Diagnosis not present

## 2022-12-30 DIAGNOSIS — E7800 Pure hypercholesterolemia, unspecified: Secondary | ICD-10-CM | POA: Diagnosis present

## 2022-12-30 DIAGNOSIS — Y92008 Other place in unspecified non-institutional (private) residence as the place of occurrence of the external cause: Secondary | ICD-10-CM | POA: Diagnosis not present

## 2022-12-30 DIAGNOSIS — R079 Chest pain, unspecified: Secondary | ICD-10-CM | POA: Diagnosis present

## 2022-12-30 DIAGNOSIS — E1165 Type 2 diabetes mellitus with hyperglycemia: Secondary | ICD-10-CM | POA: Diagnosis present

## 2022-12-30 DIAGNOSIS — D649 Anemia, unspecified: Secondary | ICD-10-CM | POA: Diagnosis not present

## 2022-12-30 DIAGNOSIS — E669 Obesity, unspecified: Secondary | ICD-10-CM | POA: Diagnosis not present

## 2022-12-30 DIAGNOSIS — Z8619 Personal history of other infectious and parasitic diseases: Secondary | ICD-10-CM | POA: Diagnosis not present

## 2022-12-30 DIAGNOSIS — Z8616 Personal history of COVID-19: Secondary | ICD-10-CM | POA: Diagnosis not present

## 2022-12-30 DIAGNOSIS — M1A9XX0 Chronic gout, unspecified, without tophus (tophi): Secondary | ICD-10-CM | POA: Diagnosis present

## 2022-12-30 DIAGNOSIS — Z89511 Acquired absence of right leg below knee: Secondary | ICD-10-CM | POA: Diagnosis not present

## 2022-12-30 DIAGNOSIS — N186 End stage renal disease: Secondary | ICD-10-CM | POA: Diagnosis present

## 2022-12-30 DIAGNOSIS — N179 Acute kidney failure, unspecified: Secondary | ICD-10-CM | POA: Diagnosis present

## 2022-12-30 DIAGNOSIS — Z6841 Body Mass Index (BMI) 40.0 and over, adult: Secondary | ICD-10-CM | POA: Diagnosis not present

## 2022-12-30 DIAGNOSIS — N185 Chronic kidney disease, stage 5: Secondary | ICD-10-CM | POA: Diagnosis not present

## 2022-12-30 DIAGNOSIS — I2489 Other forms of acute ischemic heart disease: Secondary | ICD-10-CM | POA: Diagnosis present

## 2022-12-30 DIAGNOSIS — D631 Anemia in chronic kidney disease: Secondary | ICD-10-CM | POA: Diagnosis present

## 2022-12-30 DIAGNOSIS — N2581 Secondary hyperparathyroidism of renal origin: Secondary | ICD-10-CM | POA: Diagnosis present

## 2022-12-30 DIAGNOSIS — E1151 Type 2 diabetes mellitus with diabetic peripheral angiopathy without gangrene: Secondary | ICD-10-CM | POA: Diagnosis present

## 2022-12-30 DIAGNOSIS — E1122 Type 2 diabetes mellitus with diabetic chronic kidney disease: Secondary | ICD-10-CM | POA: Diagnosis present

## 2022-12-30 DIAGNOSIS — I5021 Acute systolic (congestive) heart failure: Secondary | ICD-10-CM

## 2022-12-30 DIAGNOSIS — Z794 Long term (current) use of insulin: Secondary | ICD-10-CM | POA: Diagnosis not present

## 2022-12-30 DIAGNOSIS — Z89512 Acquired absence of left leg below knee: Secondary | ICD-10-CM | POA: Diagnosis not present

## 2022-12-30 LAB — GLUCOSE, CAPILLARY
Glucose-Capillary: 160 mg/dL — ABNORMAL HIGH (ref 70–99)
Glucose-Capillary: 227 mg/dL — ABNORMAL HIGH (ref 70–99)
Glucose-Capillary: 200 mg/dL — ABNORMAL HIGH (ref 70–99)
Glucose-Capillary: 211 mg/dL — ABNORMAL HIGH (ref 70–99)

## 2022-12-30 LAB — ECHOCARDIOGRAM COMPLETE
AR max vel: 4.25 cm2
AV Area VTI: 4.07 cm2
AV Area mean vel: 4.05 cm2
AV Mean grad: 3 mm[Hg]
AV Peak grad: 5.1 mm[Hg]
Ao pk vel: 1.13 m/s
Area-P 1/2: 4.85 cm2
Height: 75 in
S' Lateral: 4.5 cm
Weight: 4640 [oz_av]

## 2022-12-30 LAB — CBC
HCT: 26 % — ABNORMAL LOW (ref 39.0–52.0)
Hemoglobin: 8 g/dL — ABNORMAL LOW (ref 13.0–17.0)
MCH: 26.9 pg (ref 26.0–34.0)
MCHC: 30.8 g/dL (ref 30.0–36.0)
MCV: 87.5 fL (ref 80.0–100.0)
Platelets: 373 10*3/uL (ref 150–400)
RBC: 2.97 MIL/uL — ABNORMAL LOW (ref 4.22–5.81)
RDW: 14.6 % (ref 11.5–15.5)
WBC: 14.1 10*3/uL — ABNORMAL HIGH (ref 4.0–10.5)
nRBC: 0 % (ref 0.0–0.2)

## 2022-12-30 LAB — COMPREHENSIVE METABOLIC PANEL
ALT: 20 U/L (ref 0–44)
AST: 21 U/L (ref 15–41)
Albumin: 2.7 g/dL — ABNORMAL LOW (ref 3.5–5.0)
Alkaline Phosphatase: 102 U/L (ref 38–126)
Anion gap: 13 (ref 5–15)
BUN: 72 mg/dL — ABNORMAL HIGH (ref 6–20)
CO2: 14 mmol/L — ABNORMAL LOW (ref 22–32)
Calcium: 5.8 mg/dL — CL (ref 8.9–10.3)
Chloride: 110 mmol/L (ref 98–111)
Creatinine, Ser: 7.31 mg/dL — ABNORMAL HIGH (ref 0.61–1.24)
GFR, Estimated: 8 mL/min — ABNORMAL LOW (ref >60)
Glucose, Bld: 174 mg/dL — ABNORMAL HIGH (ref 70–99)
Potassium: 5 mmol/L (ref 3.5–5.1)
Sodium: 137 mmol/L (ref 135–145)
Total Bilirubin: 0.5 mg/dL (ref <1.2)
Total Protein: 6.2 g/dL — ABNORMAL LOW (ref 6.5–8.1)

## 2022-12-30 LAB — TROPONIN I (HIGH SENSITIVITY): Troponin I (High Sensitivity): 420 ng/L (ref <18)

## 2022-12-30 MED ORDER — OXYCODONE HCL 5 MG PO TABS
5.0000 mg | ORAL_TABLET | Freq: Four times a day (QID) | ORAL | Status: DC | PRN
  Administered 2022-12-30 – 2023-01-02 (×6): 5 mg via ORAL
  Filled 2022-12-30 (×6): qty 1

## 2022-12-30 MED ORDER — MAGNESIUM SULFATE 4 GM/100ML IV SOLN
4.0000 g | Freq: Once | INTRAVENOUS | Status: AC
Start: 2022-12-30 — End: 2022-12-30
  Administered 2022-12-30: 4 g via INTRAVENOUS
  Filled 2022-12-30: qty 100

## 2022-12-30 MED ORDER — PERFLUTREN LIPID MICROSPHERE
1.0000 mL | INTRAVENOUS | Status: AC | PRN
  Administered 2022-12-30: 5 mL via INTRAVENOUS
  Filled 2022-12-30: qty 10

## 2022-12-30 MED ORDER — ALUM & MAG HYDROXIDE-SIMETH 200-200-20 MG/5ML PO SUSP
30.0000 mL | Freq: Once | ORAL | Status: AC
  Administered 2022-12-30: 30 mL via ORAL
  Filled 2022-12-30: qty 30

## 2022-12-30 MED ORDER — SODIUM CHLORIDE 0.9 % IV SOLN
200.0000 mg | INTRAVENOUS | Status: DC
  Administered 2022-12-30 – 2023-01-03 (×3): 200 mg via INTRAVENOUS
  Filled 2022-12-30 (×5): qty 10

## 2022-12-30 MED ORDER — CALCIUM GLUCONATE-NACL 2-0.675 GM/100ML-% IV SOLN
2.0000 g | Freq: Once | INTRAVENOUS | Status: AC
  Administered 2022-12-30: 2000 mg via INTRAVENOUS
  Filled 2022-12-30: qty 100

## 2022-12-30 MED ORDER — INFLUENZA VIRUS VACC SPLIT PF (FLUZONE) 0.5 ML IM SUSY: 0.5000 mL | PREFILLED_SYRINGE | INTRAMUSCULAR | Status: DC

## 2022-12-30 NOTE — Progress Notes (Signed)
Nephrology Follow-Up Consult note   Assessment/Recommendations: Patrick [name] is a/an 57 y.o. male with a past medical history significant for HTN, HLD, DM2, CKD 4, gout, bilateral BKA, CVA who present w/ shortness of breath, elevated troponin, and worsening CKD    CKD 5: Possibly some AKI component from NSAID use but more likely progressive CKD.  Creatinine was 4.7 in May and creatinine is now 7.5.  This rate of progression would be similar to what he has seen in the past. -Treatment of shortness of breath and NSTEMI as below -We discussed that he may require dialysis during this hospitalization. Given GFR is likely worse than Crt predicts (given bilateral amputations) and poor compliance outpatient I would lean on starting HD while he is here early this week if he fails to improve -Fortunately no obvious uremia -Continue to monitor daily Cr, Dose meds for GFR -Monitor Daily I/Os, Daily weight  -Maintain MAP>65 for optimal renal perfusion.  -Avoid nephrotoxic medications including NSAIDs -Use synthetic opioids (Fentanyl/Dilaudid) if needed -Check Renal U/S to rule out obstruction   Metabolic acidosis: Associated with CKD.  Continue sodium bicarbonate 1300 mg twice daily   Hypocalcemia: Likely related to secondary hyperparathyroidism with CKD.  Continue with IV calcium gluconate as needed.  Started calcitriol.  Follow-up PTH   Anemia: Likely multifactorial.  Iron fairly depleted.  Will provide IV iron   Shortness of breath: Likely some volume overload despite reassuring chest x-ray. Continue with IV lasix 120mg  BID and monitor response   NSTEMI: Cardiology feels it is demand.  Appreciate their help   Recommendations conveyed to primary service.    Darnell Level  [facility] 12/30/2022 10:15 AM  ___________________________________________________________  CC: Chest tightness  Interval History/Subjective: Patient states he feels about the same as yesterday.  Still  with shortness of breath and some chest fullness.  Does note that he urinated fairly well over the past 24 hours.  Creatinine largely unchanged.   Medications:  Current Facility-Administered Medications  Medication Dose Route Frequency Provider Last Rate Last Admin   acetaminophen (TYLENOL) tablet 650 mg  650 mg Oral Q6H PRN Synetta Fail, MD       Or   acetaminophen (TYLENOL) suppository 650 mg  650 mg Rectal Q6H PRN Synetta Fail, MD       albuterol (PROVENTIL) (2.5 MG/3ML) 0.083% nebulizer solution 2.5 mg  2.5 mg Nebulization Q4H PRN Synetta Fail, MD       amLODipine (NORVASC) tablet 5 mg  5 mg Oral Daily Synetta Fail, MD   5 mg at 12/30/22 1610   calcitRIOL (ROCALTROL) capsule 0.5 mcg  0.5 mcg Oral Daily Darnell Level, MD   0.5 mcg at 12/30/22 9604   furosemide (LASIX) 120 mg in dextrose 5 % 50 mL IVPB  120 mg Intravenous BID Synetta Fail, MD       gabapentin (NEURONTIN) capsule 300 mg  300 mg Oral Daily PRN Synetta Fail, MD   300 mg at 12/29/22 2034   heparin injection 5,000 Units  5,000 Units Subcutaneous Q8H Synetta Fail, MD   5,000 Units at 12/30/22 0540   influenza vac split trivalent PF (FLULAVAL) injection 0.5 mL  0.5 mL Intramuscular Tomorrow-1000 Synetta Fail, MD       insulin aspart (novoLOG) injection 0-15 Units  0-15 Units Subcutaneous TID WC Synetta Fail, MD   3 Units at 12/30/22 0855   insulin glargine-yfgn (SEMGLEE) injection 15 Units  15 Units Subcutaneous  Daily Synetta Fail, MD   15 Units at 12/30/22 9562   magnesium sulfate IVPB 4 g 100 mL  4 g Intravenous Once Willeen Niece, MD       metoprolol tartrate (LOPRESSOR) tablet 25 mg  25 mg Oral BID Synetta Fail, MD   25 mg at 12/30/22 0840   nitroGLYCERIN (NITROSTAT) SL tablet 0.4 mg  0.4 mg Sublingual Q5 min PRN Synetta Fail, MD   0.4 mg at 12/29/22 1739   polyethylene glycol (MIRALAX / GLYCOLAX) packet 17 g  17 g Oral Daily PRN Synetta Fail, MD       predniSONE (DELTASONE) tablet 20 mg  20 mg Oral BID WC Synetta Fail, MD   20 mg at 12/30/22 0840   simvastatin (ZOCOR) tablet 10 mg  10 mg Oral QHS Synetta Fail, MD   10 mg at 12/29/22 2111   sodium bicarbonate tablet 1,300 mg  1,300 mg Oral BID Darnell Level, MD   1,300 mg at 12/30/22 0840   sodium chloride flush (NS) 0.9 % injection 3 mL  3 mL Intravenous Q12H Synetta Fail, MD   3 mL at 12/30/22 1308   Facility-Administered Medications Ordered in Other Encounters  Medication Dose Route Frequency Provider Last Rate Last Admin   ketamine (KETALAR) bolus via infusion 68.05 mg  0.5 mg/kg Intravenous Once Nadara Mustard, MD          Review of Systems: 10 systems reviewed and negative except per interval history/subjective  Physical Exam: Vitals:   12/30/22 0345 12/30/22 0809  BP: (!) 163/113 (!) 157/98  Pulse: 86 84  Resp: 18 18  Temp: 98.7 F (37.1 C) 98 F (36.7 C)  SpO2: 100% 98%   No intake/output data recorded.  Intake/Output Summary (Last 24 hours) at 12/30/2022 1015 Last data filed at 12/30/2022 0347 Gross per 24 hour  Intake 293 ml  Output 1550 ml  Net -1257 ml   Constitutional: well-appearing, no acute distress ENMT: ears and nose without scars or lesions, MMM CV: normal rate, no edema Respiratory: Mild increased work of breathing, bilateral chest rise Gastrointestinal: soft, non-tender, no palpable masses or hernias Skin: no visible lesions or rashes Psych: alert, judgement/insight appropriate, appropriate mood and affect   Test Results I personally reviewed new and old clinical labs and radiology tests Lab Results  Component Value Date   NA 137 12/30/2022   K 5.0 12/30/2022   CL 110 12/30/2022   CO2 14 (L) 12/30/2022   BUN 72 (H) 12/30/2022   CREATININE 7.31 (H) 12/30/2022   CALCIUM 5.8 (LL) 12/30/2022   ALBUMIN 2.7 (L) 12/30/2022   PHOS 6.4 (H) 12/29/2022    CBC Recent Labs  Lab 12/29/22 1516  12/30/22 0308  WBC 14.2* 14.1*  NEUTROABS 11.5*  --   HGB 8.5* 8.0*  HCT 27.7* 26.0*  MCV 89.9 87.5  PLT 393 373
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
## 2022-12-30 NOTE — Plan of Care (Signed)
  Problem: Education: Goal: Ability to demonstrate management of disease process will improve Outcome: Progressing Goal: Ability to verbalize understanding of medication therapies will improve Outcome: Progressing Goal: Individualized Educational Video(s) Outcome: Progressing   Problem: Activity: Goal: Capacity to carry out activities will improve Outcome: Progressing   Problem: Cardiac: Goal: Ability to achieve and maintain adequate cardiopulmonary perfusion will improve Outcome: Progressing   Problem: Education: Goal: Ability to describe self-care measures that may prevent or decrease complications (Diabetes Survival Skills Education) will improve Outcome: Progressing Goal: Individualized Educational Video(s) Outcome: Progressing   Problem: Coping: Goal: Ability to adjust to condition or change in health will improve Outcome: Progressing   Problem: Fluid Volume: Goal: Ability to maintain a balanced intake and output will improve Outcome: Progressing   Problem: Health Behavior/Discharge Planning: Goal: Ability to identify and utilize available resources and services will improve Outcome: Progressing Goal: Ability to manage health-related needs will improve Outcome: Progressing   Problem: Metabolic: Goal: Ability to maintain appropriate glucose levels will improve Outcome: Progressing   Problem: Nutritional: Goal: Maintenance of adequate nutrition will improve Outcome: Progressing Goal: Progress toward achieving an optimal weight will improve Outcome: Progressing   Problem: Skin Integrity: Goal: Risk for impaired skin integrity will decrease Outcome: Progressing   Problem: Tissue Perfusion: Goal: Adequacy of tissue perfusion will improve Outcome: Progressing   Problem: Education: Goal: Knowledge of General Education information will improve Description: Including pain rating scale, medication(s)/side effects and non-pharmacologic comfort measures Outcome:  Progressing   Problem: Health Behavior/Discharge Planning: Goal: Ability to manage health-related needs will improve Outcome: Progressing   Problem: Clinical Measurements: Goal: Ability to maintain clinical measurements within normal limits will improve Outcome: Progressing Goal: Will remain free from infection Outcome: Progressing Goal: Diagnostic test results will improve Outcome: Progressing Goal: Respiratory complications will improve Outcome: Progressing Goal: Cardiovascular complication will be avoided Outcome: Progressing   Problem: Activity: Goal: Risk for activity intolerance will decrease Outcome: Progressing   Problem: Nutrition: Goal: Adequate nutrition will be maintained Outcome: Progressing   Problem: Coping: Goal: Level of anxiety will decrease Outcome: Progressing   Problem: Elimination: Goal: Will not experience complications related to bowel motility Outcome: Progressing Goal: Will not experience complications related to urinary retention Outcome: Progressing   Problem: Pain Management: Goal: General experience of comfort will improve Outcome: Progressing   Problem: Safety: Goal: Ability to remain free from injury will improve Outcome: Progressing   Problem: Skin Integrity: Goal: Risk for impaired skin integrity will decrease Outcome: Progressing

## 2022-12-30 NOTE — Progress Notes (Signed)
*  PRELIMINARY RESULTS* Echocardiogram 2D Echocardiogram has been performed.  Patrick [name] 12/30/2022, 11:05 AM

## 2022-12-31 ENCOUNTER — Inpatient Hospital Stay (HOSPITAL_COMMUNITY): Payer: No Typology Code available for payment source

## 2022-12-31 DIAGNOSIS — N186 End stage renal disease: Secondary | ICD-10-CM

## 2022-12-31 DIAGNOSIS — N185 Chronic kidney disease, stage 5: Secondary | ICD-10-CM

## 2022-12-31 DIAGNOSIS — N179 Acute kidney failure, unspecified: Secondary | ICD-10-CM | POA: Diagnosis not present

## 2022-12-31 DIAGNOSIS — N1832 Chronic kidney disease, stage 3b: Secondary | ICD-10-CM | POA: Diagnosis not present

## 2022-12-31 LAB — CBC
HCT: 25.4 % — ABNORMAL LOW (ref 39.0–52.0)
Hemoglobin: 7.9 g/dL — ABNORMAL LOW (ref 13.0–17.0)
MCH: 27 pg (ref 26.0–34.0)
MCHC: 31.1 g/dL (ref 30.0–36.0)
MCV: 86.7 fL (ref 80.0–100.0)
Platelets: 426 10*3/uL — ABNORMAL HIGH (ref 150–400)
RBC: 2.93 MIL/uL — ABNORMAL LOW (ref 4.22–5.81)
RDW: 14.2 % (ref 11.5–15.5)
WBC: 17.6 10*3/uL — ABNORMAL HIGH (ref 4.0–10.5)
nRBC: 0 % (ref 0.0–0.2)

## 2022-12-31 LAB — BASIC METABOLIC PANEL
Anion gap: 13 (ref 5–15)
BUN: 72 mg/dL — ABNORMAL HIGH (ref 6–20)
CO2: 16 mmol/L — ABNORMAL LOW (ref 22–32)
Calcium: 6.4 mg/dL — CL (ref 8.9–10.3)
Chloride: 110 mmol/L (ref 98–111)
Creatinine, Ser: 7.41 mg/dL — ABNORMAL HIGH (ref 0.61–1.24)
GFR, Estimated: 8 mL/min — ABNORMAL LOW (ref >60)
Glucose, Bld: 228 mg/dL — ABNORMAL HIGH (ref 70–99)
Potassium: 5.4 mmol/L — ABNORMAL HIGH (ref 3.5–5.1)
Sodium: 139 mmol/L (ref 135–145)

## 2022-12-31 LAB — PARATHYROID HORMONE, INTACT (NO CA): PTH: 350 pg/mL — ABNORMAL HIGH (ref 15–65)

## 2022-12-31 LAB — MAGNESIUM: Magnesium: 1.8 mg/dL (ref 1.7–2.4)

## 2022-12-31 LAB — GLUCOSE, CAPILLARY
Glucose-Capillary: 223 mg/dL — ABNORMAL HIGH (ref 70–99)
Glucose-Capillary: 174 mg/dL — ABNORMAL HIGH (ref 70–99)
Glucose-Capillary: 196 mg/dL — ABNORMAL HIGH (ref 70–99)
Glucose-Capillary: 329 mg/dL — ABNORMAL HIGH (ref 70–99)

## 2022-12-31 LAB — PHOSPHORUS: Phosphorus: 6.4 mg/dL — ABNORMAL HIGH (ref 2.5–4.6)

## 2022-12-31 LAB — HEPATITIS B SURFACE ANTIGEN: Hepatitis B Surface Ag: NONREACTIVE

## 2022-12-31 MED ORDER — CALCIUM GLUCONATE-NACL 1-0.675 GM/50ML-% IV SOLN
1.0000 g | Freq: Once | INTRAVENOUS | Status: AC
  Administered 2022-12-31: 1000 mg via INTRAVENOUS
  Filled 2022-12-31: qty 50

## 2022-12-31 MED ORDER — CHLORHEXIDINE GLUCONATE CLOTH 2 % EX PADS
6.0000 | MEDICATED_PAD | Freq: Every day | CUTANEOUS | Status: DC
Start: 2023-01-01 — End: 2023-01-03
  Administered 2023-01-01 – 2023-01-03 (×3): 6 via TOPICAL

## 2022-12-31 MED ORDER — INSULIN ASPART 100 UNIT/ML IJ SOLN
0.0000 [IU] | Freq: Three times a day (TID) | INTRAMUSCULAR | Status: DC
  Administered 2023-01-01: 3 [IU] via SUBCUTANEOUS
  Administered 2023-01-01: 5 [IU] via SUBCUTANEOUS
  Administered 2023-01-02: 8 [IU] via SUBCUTANEOUS
  Administered 2023-01-02: 5 [IU] via SUBCUTANEOUS
  Administered 2023-01-03: 3 [IU] via SUBCUTANEOUS

## 2022-12-31 MED ORDER — CALCIUM CARBONATE ANTACID 500 MG PO CHEW
1.0000 | CHEWABLE_TABLET | Freq: Three times a day (TID) | ORAL | Status: DC
  Administered 2022-12-31 – 2023-01-03 (×5): 200 mg via ORAL
  Filled 2022-12-31 (×6): qty 1

## 2022-12-31 MED ORDER — HYDRALAZINE HCL 25 MG PO TABS
25.0000 mg | ORAL_TABLET | Freq: Three times a day (TID) | ORAL | Status: DC
  Administered 2022-12-31 – 2023-01-03 (×10): 25 mg via ORAL
  Filled 2022-12-31 (×10): qty 1

## 2022-12-31 MED ORDER — TORSEMIDE 20 MG PO TABS
100.0000 mg | ORAL_TABLET | Freq: Every day | ORAL | Status: DC
  Administered 2023-01-02 – 2023-01-03 (×2): 100 mg via ORAL
  Filled 2022-12-31 (×2): qty 5

## 2022-12-31 MED ORDER — INSULIN ASPART 100 UNIT/ML IJ SOLN
0.0000 [IU] | Freq: Every day | INTRAMUSCULAR | Status: DC
  Administered 2022-12-31: 4 [IU] via SUBCUTANEOUS
  Administered 2023-01-01: 3 [IU] via SUBCUTANEOUS
  Administered 2023-01-02: 4 [IU] via SUBCUTANEOUS

## 2022-12-31 NOTE — Care Management (Cosign Needed)
    Durable Medical Equipment  (From admission, onward)           Start     Ordered   12/31/22 1501  For home use only DME 4 wheeled rolling walker with seat  Once       Question:  Patient needs a walker to treat with the following condition  Answer:  Weakness   12/31/22 1501

## 2022-12-31 NOTE — Progress Notes (Signed)
Consent for dialysis cath placement completed and in chart
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
## 2022-12-31 NOTE — TOC Initial Note (Signed)
Transition of Care [facility]) - Initial/Assessment Note    Patient Details  Name: Patrick [name] MRN: [id number] Date of Birth: 1965-05-05  [facility]) CM/SW Contact:    Ronny Bacon, RN Phone Number: 12/31/2022, 1:49 PM  Clinical Narrative:  Spoke with patient at bedside. Patient lives alone, has transportation and has PCP. Patient confirms having the following DME, Ephraim Hamburger, and Wheelchair Patient Copy. Attempt made to obtain [employer] from [employer], awaiting response. Patient confirms that he has transportation available when discharged.                 Expected Discharge Plan: Home/Self Care Barriers to Discharge: Continued Medical Work up   Patient Goals and CMS Choice            Expected Discharge Plan and Services   Discharge Planning Services: CM Consult   Living arrangements for the past 2 months: Single Family Home                                      Prior Living Arrangements/Services Living arrangements for the past 2 months: Single Family Home Lives with:: Self Patient language and need for interpreter reviewed:: Yes Do you feel safe going back to the place where you live?: Yes      Need for Family Participation in Patient Care: No (Comment) Care giver support system in place?: Yes (comment) Current home services: DME (WC, RW, Cane) Criminal Activity/Legal Involvement Pertinent to Current Situation/Hospitalization: No - Comment as needed  Activities of Daily Living   ADL Screening (condition at time of admission) Independently performs ADLs?: No Does the patient have a NEW difficulty with bathing/dressing/toileting/self-feeding that is expected to last >3 days?: No Does the patient have a NEW difficulty with getting in/out of bed, walking, or climbing stairs that is expected to last >3 days?: Yes (Initiates electronic notice to provider for possible PT consult) Does the patient have a NEW difficulty with communication  that is expected to last >3 days?: No Is the patient deaf or have difficulty hearing?: No Does the patient have difficulty seeing, even when wearing glasses/contacts?: No Does the patient have difficulty concentrating, remembering, or making decisions?: No  Permission Sought/Granted Permission sought to share information with : Facility Industrial/product designer granted to share information with : Yes, Verbal Permission Granted              Emotional Assessment Appearance:: Appears stated age Attitude/Demeanor/Rapport: Engaged Affect (typically observed): Appropriate Orientation: : Oriented to Self, Oriented to Place, Oriented to  Time, Oriented to Situation Alcohol / Substance Use: Not Applicable Psych Involvement: No (comment)  Admission diagnosis:  Hypocalcemia [E83.51] NSTEMI (non-ST elevated myocardial infarction) (HCC) [I21.4] Acute renal failure, unspecified acute renal failure type (HCC) [N17.9] Acute congestive heart failure, unspecified heart failure type (HCC) [I50.9] Acute renal failure superimposed on stage 3b chronic kidney disease, unspecified acute renal failure type (HCC) [N17.9, N18.32] Patient Active Problem List   Diagnosis Date Noted   History of osteomyelitis 12/29/2022   Acute renal failure superimposed on stage 3b chronic kidney disease, unspecified acute renal failure type (HCC) 12/29/2022   Elevated cholesterol 04/27/2022   Left below-knee amputee (HCC) 12/30/2020   Constipation 09/25/2020   Thrombocytosis 09/25/2020   S/P BKA (below knee amputation) unilateral, right (HCC) 09/01/2020   Anemia 08/27/2020   Dehiscence of amputation stump (HCC) 07/20/2020   Cutaneous abscess of right  foot    Severe protein-calorie malnutrition (HCC)    CKD (chronic kidney disease) stage 3, GFR 30-59 ml/min (HCC) 06/04/2020   GOUT 01/27/2010   OBESITY 01/27/2010   Uncontrolled type 2 diabetes mellitus with hyperglycemia, with long-term current use of insulin  (HCC) 02/20/2008   Uncontrolled hypertension 02/20/2008   PCP:  [facility]:   [facility] [phone] - [place], [place] - [address] AT [facility] OF [address] [place]  [address] Phone: [phone] Fax: [phone]     Social Determinants of Health (SDOH) Social History: SDOH Screenings   Food Insecurity: No Food Insecurity (12/29/2022)  Housing: Low Risk  (12/29/2022)  Transportation Needs: No Transportation Needs (12/29/2022)  Utilities: Not At Risk (12/29/2022)  Depression (PHQ2-9): Low Risk  (04/27/2022)  Social Connections: Unknown (06/30/2021)   Received from [facility]  Tobacco Use: Low Risk  (12/29/2022)   SDOH Interventions:     Readmission Risk Interventions    01/10/2021   11:02 AM 08/29/2020   12:23 PM  Readmission Risk Prevention Plan  Transportation Screening Complete Complete  PCP or Specialist Appt within 3-5 Days  Complete  HRI or Home Care Consult  Complete  Social Work Consult for Recovery Care Planning/Counseling  Complete  Palliative Care Screening  Not Applicable  Medication Review Oceanographer) Complete Complete  PCP or Specialist appointment within 3-5 days of discharge Complete   HRI or Home Care Consult Complete   SW Recovery Care/Counseling Consult Complete   Palliative Care Screening Not Applicable   Skilled Nursing Facility Complete
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
## 2022-12-31 NOTE — Progress Notes (Addendum)
Nephrology Follow-Up Consult note   Assessment/Recommendations: Patrick [name] is a/an 57 y.o. male with a past medical history significant for HTN, HLD, DM2, CKD 4, gout, bilateral BKA, CVA who present w/ shortness of breath, elevated troponin, and worsening CKD    ESRD: Underlying DKD.  Possibly some AKI component from NSAID use but more likely progressive CKD. Renal US no obstruction. Creatinine was 4.7 in May and creatinine is now 7.5.  This rate of progression would be similar to what he has seen in the past.  Follows with [facility] but did not attend visit in the past year. I suspect he is ESRD now.  -I have recommended he initiate dialysis during this admission -- he has many electrolyte abnormalities, eGFR 8 and early uremic symptoms.  I'm concerned if he delays initiation he may end up presenting in an emergency situation.  Additionally, he had not been attending outpt f/u as it is -consult [facility] for [facility] and perm access; appreciate consult - vein mapping has been ordered -CLIP to outpt HD -Continue to monitor daily Cr, Dose meds for GFR -Monitor Daily I/Os, Daily weight  -Maintain MAP>65 for optimal renal perfusion.  -Avoid nephrotoxic medications including NSAIDs -Use synthetic opioids (Fentanyl/Dilaudid) if needed   Metabolic acidosis: Associated with CKD.  Continue sodium bicarbonate 1300 mg twice daily until dialysis initiation.   Hypocalcemia: Likely related to secondary hyperparathyroidism with CKD.  Started calcitriol.  Remains low despite IV ca, will start oral Ca.  Follow-up PTH   Anemia: Likely multifactorial.  Iron fairly depleted.  Receiving IV iron.  Start ESA with HD.   Shortness of breath: Likely some volume overload despite reassuring chest x-ray. Improved with diuresis. Change to po diuretic.   NSTEMI: Cardiology feels it is demand.  Appreciate their help   Tyler Pita [facility] 12/31/2022 11:16  AM  ___________________________________________________________  CC: Chest tightness  Interval History/Subjective: No new issues.  Appetite ok, having hiccups.   I/Os 0.3 / 3.3L past 24h, net neg ~5L for admission on furosemide 120 BID IV.  Creatinine largely unchanged.   Medications:  Current Facility-Administered Medications  Medication Dose Route Frequency Provider Last Rate Last Admin   acetaminophen (TYLENOL) tablet 650 mg  650 mg Oral Q6H PRN Synetta Fail, MD       Or   acetaminophen (TYLENOL) suppository 650 mg  650 mg Rectal Q6H PRN Synetta Fail, MD       albuterol (PROVENTIL) (2.5 MG/3ML) 0.083% nebulizer solution 2.5 mg  2.5 mg Nebulization Q4H PRN Synetta Fail, MD       amLODipine (NORVASC) tablet 5 mg  5 mg Oral Daily Synetta Fail, MD   5 mg at 12/31/22 1610   calcitRIOL (ROCALTROL) capsule 0.5 mcg  0.5 mcg Oral Daily Darnell Level, MD   0.5 mcg at 12/31/22 0910   calcium gluconate 1 g/ 50 mL sodium chloride IVPB  1 g Intravenous Once Willeen Niece, MD       furosemide (LASIX) 120 mg in dextrose 5 % 50 mL IVPB  120 mg Intravenous BID Synetta Fail, MD 62 mL/hr at 12/31/22 0916 120 mg at 12/31/22 0916   gabapentin (NEURONTIN) capsule 300 mg  300 mg Oral Daily PRN Synetta Fail, MD   300 mg at 12/29/22 2034   heparin injection 5,000 Units  5,000 Units Subcutaneous Q8H Synetta Fail, MD   5,000 Units at 12/31/22 0507   influenza vac split trivalent PF (FLULAVAL) injection  0.5 mL  0.5 mL Intramuscular Tomorrow-1000 Synetta Fail, MD       insulin aspart (novoLOG) injection 0-15 Units  0-15 Units Subcutaneous TID WC Synetta Fail, MD   5 Units at 12/31/22 0909   insulin glargine-yfgn [facility]) injection 15 Units  15 Units Subcutaneous Daily Synetta Fail, MD   15 Units at 12/31/22 2130   iron sucrose (VENOFER) 200 mg in sodium chloride 0.9 % 100 mL IVPB  200 mg Intravenous Q24H Darnell Level, MD   Stopped at  12/30/22 2010   metoprolol tartrate (LOPRESSOR) tablet 25 mg  25 mg Oral BID Synetta Fail, MD   25 mg at 12/31/22 0908   nitroGLYCERIN (NITROSTAT) SL tablet 0.4 mg  0.4 mg Sublingual Q5 min PRN Synetta Fail, MD   0.4 mg at 12/29/22 1739   oxyCODONE (Oxy IR/ROXICODONE) immediate release tablet 5 mg  5 mg Oral Q6H PRN Willeen Niece, MD   5 mg at 12/31/22 8657   polyethylene glycol (MIRALAX / GLYCOLAX) packet 17 g  17 g Oral Daily PRN Synetta Fail, MD       predniSONE (DELTASONE) tablet 20 mg  20 mg Oral BID WC Synetta Fail, MD   20 mg at 12/31/22 0908   simvastatin (ZOCOR) tablet 10 mg  10 mg Oral QHS Synetta Fail, MD   10 mg at 12/30/22 2150   sodium bicarbonate tablet 1,300 mg  1,300 mg Oral BID Darnell Level, MD   1,300 mg at 12/31/22 8469   sodium chloride flush (NS) 0.9 % injection 3 mL  3 mL Intravenous Q12H Synetta Fail, MD   3 mL at 12/31/22 6295   Facility-Administered Medications Ordered in Other Encounters  Medication Dose Route Frequency Provider Last Rate Last Admin   ketamine (KETALAR) bolus via infusion 68.05 mg  0.5 mg/kg Intravenous Once Nadara Mustard, MD          Review of Systems: 10 systems reviewed and negative except per interval history/subjective  Physical Exam: Vitals:   12/31/22 0751 12/31/22 0908  BP: (!) 158/112 (!) 158/112  Pulse: 84 84  Resp: 18   Temp:    SpO2: 98%    Total I/O In: -  Out: 800 [Urine:800]  Intake/Output Summary (Last 24 hours) at 12/31/2022 1116 Last data filed at 12/31/2022 0750 Gross per 24 hour  Intake 337 ml  Output 4070 ml  Net -3733 ml   Constitutional: well-appearing, no acute distress ENMT: ears and nose without scars or lesions, MMM CV: normal rate, no edema Respiratory: on RA with normal WOB Gastrointestinal: soft, non-tender, no palpable masses or hernias Skin: no visible lesions or rashes Psych: alert, judgement/insight appropriate, appropriate mood and  affect   Test Results I personally reviewed new and old clinical labs and radiology tests Lab Results  Component Value Date   NA 139 12/31/2022   K 5.4 (H) 12/31/2022   CL 110 12/31/2022   CO2 16 (L) 12/31/2022   BUN 72 (H) 12/31/2022   CREATININE 7.41 (H) 12/31/2022   CALCIUM 6.4 (LL) 12/31/2022   ALBUMIN 2.7 (L) 12/30/2022   PHOS 6.4 (H) 12/31/2022    CBC Recent Labs  Lab 12/29/22 1516 12/30/22 0308 12/31/22 0636  WBC 14.2* 14.1* 17.6*  NEUTROABS 11.5*  --   --   HGB 8.5* 8.0* 7.9*  HCT 27.7* 26.0* 25.4*  MCV 89.9 87.5 86.7  PLT 393 373 426*

## 2022-12-31 NOTE — Progress Notes (Signed)
Mobility Specialist Progress Note:   12/31/22 1611  Mobility  Activity Ambulated with assistance in room  Level of Assistance Modified independent, requires aide device or extra time  Assistive Device None  Distance Ambulated (ft) 12 ft  Activity Response Tolerated well  Mobility Referral Yes  $Mobility charge 1 Mobility  Mobility Specialist Start Time (ACUTE ONLY) 1600  Mobility Specialist Stop Time (ACUTE ONLY) 1610  Mobility Specialist Time Calculation (min) (ACUTE ONLY) 10 min   Responded to pt's chair alarm, pt ambulating around room using ModI with bed rails and sink for stability. Assisted pt safely to EOB, RN in room reconnecting tele. Left pt with all needs met, call bell in reach.   Feliciana Rossetti Mobility Specialist Please contact via Special educational needs teacher or  Rehab office at [phone]
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
## 2022-12-31 NOTE — Plan of Care (Signed)
  Problem: Education: Goal: Ability to demonstrate management of disease process will improve Outcome: Progressing Goal: Ability to verbalize understanding of medication therapies will improve Outcome: Progressing Goal: Individualized Educational Video(s) Outcome: Progressing   Problem: Activity: Goal: Capacity to carry out activities will improve Outcome: Progressing   Problem: Cardiac: Goal: Ability to achieve and maintain adequate cardiopulmonary perfusion will improve Outcome: Progressing   Problem: Education: Goal: Ability to describe self-care measures that may prevent or decrease complications (Diabetes Survival Skills Education) will improve Outcome: Progressing Goal: Individualized Educational Video(s) Outcome: Progressing   Problem: Coping: Goal: Ability to adjust to condition or change in health will improve Outcome: Progressing   Problem: Fluid Volume: Goal: Ability to maintain a balanced intake and output will improve Outcome: Progressing   Problem: Health Behavior/Discharge Planning: Goal: Ability to identify and utilize available resources and services will improve Outcome: Progressing Goal: Ability to manage health-related needs will improve Outcome: Progressing   Problem: Metabolic: Goal: Ability to maintain appropriate glucose levels will improve Outcome: Progressing   Problem: Nutritional: Goal: Maintenance of adequate nutrition will improve Outcome: Progressing Goal: Progress toward achieving an optimal weight will improve Outcome: Progressing   Problem: Skin Integrity: Goal: Risk for impaired skin integrity will decrease Outcome: Progressing   Problem: Tissue Perfusion: Goal: Adequacy of tissue perfusion will improve Outcome: Progressing   Problem: Education: Goal: Knowledge of General Education information will improve Description: Including pain rating scale, medication(s)/side effects and non-pharmacologic comfort measures Outcome:  Progressing   Problem: Health Behavior/Discharge Planning: Goal: Ability to manage health-related needs will improve Outcome: Progressing   Problem: Clinical Measurements: Goal: Ability to maintain clinical measurements within normal limits will improve Outcome: Progressing Goal: Will remain free from infection Outcome: Progressing Goal: Diagnostic test results will improve Outcome: Progressing Goal: Respiratory complications will improve Outcome: Progressing Goal: Cardiovascular complication will be avoided Outcome: Progressing   Problem: Activity: Goal: Risk for activity intolerance will decrease Outcome: Progressing   Problem: Nutrition: Goal: Adequate nutrition will be maintained Outcome: Progressing   Problem: Coping: Goal: Level of anxiety will decrease Outcome: Progressing   Problem: Elimination: Goal: Will not experience complications related to bowel motility Outcome: Progressing Goal: Will not experience complications related to urinary retention Outcome: Progressing   Problem: Pain Management: Goal: General experience of comfort will improve Outcome: Progressing   Problem: Safety: Goal: Ability to remain free from injury will improve Outcome: Progressing   Problem: Skin Integrity: Goal: Risk for impaired skin integrity will decrease Outcome: Progressing

## 2022-12-31 NOTE — Evaluation (Signed)
Physical Therapy Evaluation and Discharge Patient Details Name: Patrick [name] MRN: [id number] DOB: 1965/04/04 Today's Date: 12/31/2022  History of Present Illness  57 y.o. male presentied 11/9 with worsening chest pain and shortness of breath. Dx: Acute renal failure superimposed on stage 3b chronic kidney disease, unspecified acute renal failure type, Demand Ischemia. 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   Clinical Impression  Patient evaluated by Physical Therapy with no further acute PT needs identified. All education has been completed and the patient has no further questions. Pt reports feeling back to baseline. Transfers and ambulates at mod I level in room with RW. He requests rollator at d/c; states he has difficulty navigating long hilly distances in [place] where he works as a Marine scientist for [employer].  See below for any follow-up Physical Therapy or equipment needs. PT is signing off. Thank you for this referral.         If plan is discharge home, recommend the following: Assist for transportation   Can travel by private vehicle    yes    Equipment Recommendations Rollator (4 wheels)  Recommendations for Other Services       Functional Status Assessment Patient has not had a recent decline in their functional status     Precautions / Restrictions Precautions Precautions: Fall Restrictions Weight Bearing Restrictions: No      Mobility  Bed Mobility Overal bed mobility: Modified Independent             General bed mobility comments: able to donne bil prosthetics EOB no assist.    Transfers Overall transfer level: Modified independent Equipment used: Rolling walker (2 wheels)               General transfer comment: Rises without assist, RW for support. Performed from bed, sat on toilet with use of rail.     Ambulation/Gait Ambulation/Gait assistance: Modified independent (Device/Increase time) Gait Distance (Feet): 20 Feet Assistive device: Rolling walker (2 wheels) Gait Pattern/deviations: Wide base of support, Decreased stride length Gait velocity: dec Gait velocity interpretation: <1.31 ft/sec, indicative of household ambulator   General Gait Details: Wide BOS, fair reliance on RW for support. No overt LOB. Urgency to use rest room. Mod I without physical intervention with gait in room.  Stairs            Wheelchair Mobility     Tilt Bed    Modified Rankin (Stroke Patients Only)       Balance Overall balance assessment: Mild deficits observed, not formally tested                                           Pertinent Vitals/Pain Pain Assessment Pain Assessment: No/denies pain    Home Living Family/patient expects to be discharged to:: Private residence Living Arrangements: Alone Available Help at Discharge: Family;Friend(s);Neighbor;Available PRN/intermittently Type of Home: Apartment Home Access: Ramped entrance       Home Layout: One level Home Equipment: Toilet riser;Cane - single point;Rolling Walker (2 wheels);Tub bench      Prior Function Prior Level of Function : Independent/Modified Independent;Driving;Working/employed             Mobility Comments: Uses SPC for mobility;  uses RW for gout flares ADLs Comments: Ind, PA 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[employer], Works at [employer], mostly  at desk all day     Extremity/Trunk Assessment   Upper Extremity Assessment Upper Extremity Assessment: Defer to OT evaluation    Lower Extremity Assessment Lower Extremity Assessment:  (Hx of BIL BKA)       Communication   Communication Communication: No apparent difficulties  Cognition Arousal: Alert Behavior During Therapy: WFL for tasks assessed/performed Overall Cognitive Status: Within Functional Limits for tasks assessed                                           General Comments      Exercises     Assessment/Plan    PT Assessment Patient does not need any further PT services  PT Problem List Decreased strength;Decreased activity tolerance;Decreased balance;Decreased mobility       PT Treatment Interventions      PT Goals (Current goals can be found in the Care Plan section)  Acute Rehab PT Goals Patient Stated Goal: Go home asap PT Goal Formulation: All assessment and education complete, DC therapy    Frequency       Co-evaluation               AM-PAC PT "6 Clicks" Mobility  Outcome Measure Help needed turning from your back to your side while in a flat bed without using bedrails?: None Help needed moving from lying on your back to sitting on the side of a flat bed without using bedrails?: None Help needed moving to and from a bed to a chair (including a wheelchair)?: None Help needed standing up from a chair using your arms (e.g., wheelchair or bedside chair)?: None Help needed to walk in hospital room?: None Help needed climbing 3-5 steps with a railing? : A Little 6 Click Score: 23    End of Session Equipment Utilized During Treatment: Gait belt Activity Tolerance: Patient tolerated treatment well Patient left: Other (comment) (Rest room, RN aware, pt oriented to call bell on wall) Nurse Communication: Mobility status PT Visit Diagnosis: Other abnormalities of gait and mobility (R26.89)    Time: 2536-6440 PT Time Calculation (min) (ACUTE ONLY): 17 min   Charges:   PT Evaluation $PT Eval Low Complexity: 1 Low   PT General Charges $$ ACUTE PT VISIT: 1 Visit         Kathlyn Sacramento, PT, DPT [facility]  Rehabilitation Services Physical Therapist Office: [phone] Website: [email or web address]   Berton Mount 12/31/2022, 11:31 AM

## 2022-12-31 NOTE — Progress Notes (Signed)
Heart Failure Navigator Progress Note  Assessed for Heart & Vascular TOC clinic readiness.  Patient does not meet criteria due to CKD V, starting hemodialysis.   Navigator will sign off at this time.   Rhae Hammock, BSN, Scientist, clinical (histocompatibility and immunogenetics) Only

## 2023-01-01 ENCOUNTER — Inpatient Hospital Stay (HOSPITAL_COMMUNITY): Payer: No Typology Code available for payment source | Admitting: Anesthesiology

## 2023-01-01 ENCOUNTER — Other Ambulatory Visit: Payer: Self-pay

## 2023-01-01 ENCOUNTER — Inpatient Hospital Stay (HOSPITAL_COMMUNITY): Payer: No Typology Code available for payment source

## 2023-01-01 ENCOUNTER — Encounter (HOSPITAL_COMMUNITY): Payer: Self-pay | Admitting: Internal Medicine

## 2023-01-01 ENCOUNTER — Encounter (HOSPITAL_COMMUNITY)
Admission: EM | Disposition: A | Discharge status: Home / Self Care | Payer: Self-pay | Source: Home / Self Care | Attending: Family Medicine

## 2023-01-01 DIAGNOSIS — Z992 Dependence on renal dialysis: Secondary | ICD-10-CM

## 2023-01-01 DIAGNOSIS — N1832 Chronic kidney disease, stage 3b: Secondary | ICD-10-CM | POA: Diagnosis not present

## 2023-01-01 DIAGNOSIS — N186 End stage renal disease: Secondary | ICD-10-CM | POA: Diagnosis not present

## 2023-01-01 DIAGNOSIS — N179 Acute kidney failure, unspecified: Secondary | ICD-10-CM | POA: Diagnosis not present

## 2023-01-01 DIAGNOSIS — E1122 Type 2 diabetes mellitus with diabetic chronic kidney disease: Secondary | ICD-10-CM | POA: Diagnosis not present

## 2023-01-01 DIAGNOSIS — I120 Hypertensive chronic kidney disease with stage 5 chronic kidney disease or end stage renal disease: Secondary | ICD-10-CM | POA: Diagnosis not present

## 2023-01-01 HISTORY — PX: INSERTION OF DIALYSIS CATHETER: SHX1324

## 2023-01-01 LAB — CBC
HCT: 27 % — ABNORMAL LOW (ref 39.0–52.0)
Hemoglobin: 8.5 g/dL — ABNORMAL LOW (ref 13.0–17.0)
MCH: 27.2 pg (ref 26.0–34.0)
MCHC: 31.5 g/dL (ref 30.0–36.0)
MCV: 86.5 fL (ref 80.0–100.0)
Platelets: 446 10*3/uL — ABNORMAL HIGH (ref 150–400)
RBC: 3.12 MIL/uL — ABNORMAL LOW (ref 4.22–5.81)
RDW: 14.4 % (ref 11.5–15.5)
WBC: 20.2 10*3/uL — ABNORMAL HIGH (ref 4.0–10.5)
nRBC: 0 % (ref 0.0–0.2)

## 2023-01-01 LAB — GLUCOSE, CAPILLARY
Glucose-Capillary: 195 mg/dL — ABNORMAL HIGH (ref 70–99)
Glucose-Capillary: 186 mg/dL — ABNORMAL HIGH (ref 70–99)
Glucose-Capillary: 182 mg/dL — ABNORMAL HIGH (ref 70–99)
Glucose-Capillary: 183 mg/dL — ABNORMAL HIGH (ref 70–99)
Glucose-Capillary: 244 mg/dL — ABNORMAL HIGH (ref 70–99)
Glucose-Capillary: 261 mg/dL — ABNORMAL HIGH (ref 70–99)

## 2023-01-01 LAB — RENAL FUNCTION PANEL
Albumin: 2.8 g/dL — ABNORMAL LOW (ref 3.5–5.0)
Anion gap: 14 (ref 5–15)
BUN: 76 mg/dL — ABNORMAL HIGH (ref 6–20)
CO2: 18 mmol/L — ABNORMAL LOW (ref 22–32)
Calcium: 6.5 mg/dL — ABNORMAL LOW (ref 8.9–10.3)
Chloride: 104 mmol/L (ref 98–111)
Creatinine, Ser: 7.47 mg/dL — ABNORMAL HIGH (ref 0.61–1.24)
GFR, Estimated: 8 mL/min — ABNORMAL LOW (ref >60)
Glucose, Bld: 219 mg/dL — ABNORMAL HIGH (ref 70–99)
Phosphorus: 6.3 mg/dL — ABNORMAL HIGH (ref 2.5–4.6)
Potassium: 5.7 mmol/L — ABNORMAL HIGH (ref 3.5–5.1)
Sodium: 136 mmol/L (ref 135–145)

## 2023-01-01 LAB — MAGNESIUM: Magnesium: 1.7 mg/dL (ref 1.7–2.4)

## 2023-01-01 SURGERY — INSERTION OF DIALYSIS CATHETER
Anesthesia: General

## 2023-01-01 MED ORDER — DEXAMETHASONE SODIUM PHOSPHATE 10 MG/ML IJ SOLN
INTRAMUSCULAR | Status: AC
  Filled 2023-01-01: qty 1

## 2023-01-01 MED ORDER — PROPOFOL 10 MG/ML IV BOLUS
INTRAVENOUS | Status: AC
  Filled 2023-01-01: qty 20

## 2023-01-01 MED ORDER — EPHEDRINE SULFATE-NACL 50-0.9 MG/10ML-% IV SOSY
PREFILLED_SYRINGE | INTRAVENOUS | Status: DC | PRN
  Administered 2023-01-01: 5 mg via INTRAVENOUS

## 2023-01-01 MED ORDER — LIDOCAINE HCL (PF) 1 % IJ SOLN
5.0000 mL | INTRAMUSCULAR | Status: DC | PRN
Start: 2023-01-01 — End: 2023-01-01

## 2023-01-01 MED ORDER — OXYCODONE HCL 5 MG/5ML PO SOLN: 5.0000 mg | Freq: Once | ORAL | Status: DC | PRN

## 2023-01-01 MED ORDER — LIDOCAINE HCL (PF) 1 % IJ SOLN
INTRAMUSCULAR | Status: AC
  Filled 2023-01-01: qty 30

## 2023-01-01 MED ORDER — FENTANYL CITRATE (PF) 250 MCG/5ML IJ SOLN
INTRAMUSCULAR | Status: AC
  Filled 2023-01-01: qty 5

## 2023-01-01 MED ORDER — CEFAZOLIN SODIUM 1 G IJ SOLR
INTRAMUSCULAR | Status: AC
  Filled 2023-01-01: qty 30

## 2023-01-01 MED ORDER — CHLORHEXIDINE GLUCONATE 0.12 % MT SOLN
15.0000 mL | Freq: Once | OROMUCOSAL | Status: AC
  Administered 2023-01-01: 15 mL via OROMUCOSAL

## 2023-01-01 MED ORDER — HEPARIN 6000 UNIT IRRIGATION SOLUTION
Status: AC
  Filled 2023-01-01: qty 500

## 2023-01-01 MED ORDER — SODIUM ZIRCONIUM CYCLOSILICATE 10 G PO PACK: 10.0000 g | PACK | Freq: Once | ORAL | Status: DC

## 2023-01-01 MED ORDER — PHENYLEPHRINE 80 MCG/ML (10ML) SYRINGE FOR IV PUSH (FOR BLOOD PRESSURE SUPPORT)
PREFILLED_SYRINGE | INTRAVENOUS | Status: AC
  Filled 2023-01-01: qty 10

## 2023-01-01 MED ORDER — LIDOCAINE-PRILOCAINE 2.5-2.5 % EX CREA
1.0000 | TOPICAL_CREAM | CUTANEOUS | Status: DC | PRN
Start: 2023-01-01 — End: 2023-01-01

## 2023-01-01 MED ORDER — HEPARIN SODIUM (PORCINE) 1000 UNIT/ML IJ SOLN
INTRAMUSCULAR | Status: AC
  Filled 2023-01-01: qty 10

## 2023-01-01 MED ORDER — EPHEDRINE 5 MG/ML INJ
INTRAVENOUS | Status: AC
  Filled 2023-01-01: qty 5

## 2023-01-01 MED ORDER — HEPARIN 6000 UNIT IRRIGATION SOLUTION
Status: DC | PRN
  Administered 2023-01-01: 1

## 2023-01-01 MED ORDER — ONDANSETRON HCL 4 MG/2ML IJ SOLN
INTRAMUSCULAR | Status: DC | PRN
  Administered 2023-01-01: 4 mg via INTRAVENOUS

## 2023-01-01 MED ORDER — ONDANSETRON HCL 4 MG/2ML IJ SOLN
INTRAMUSCULAR | Status: AC
  Filled 2023-01-01: qty 2

## 2023-01-01 MED ORDER — ALTEPLASE 2 MG IJ SOLR: 2.0000 mg | Freq: Once | INTRAMUSCULAR | Status: DC | PRN

## 2023-01-01 MED ORDER — DEXAMETHASONE SODIUM PHOSPHATE 10 MG/ML IJ SOLN
INTRAMUSCULAR | Status: DC | PRN
  Administered 2023-01-01: 4 mg via INTRAVENOUS

## 2023-01-01 MED ORDER — PROPOFOL 10 MG/ML IV BOLUS
INTRAVENOUS | Status: DC | PRN
  Administered 2023-01-01: 50 mg via INTRAVENOUS
  Administered 2023-01-01: 200 mg via INTRAVENOUS

## 2023-01-01 MED ORDER — 0.9 % SODIUM CHLORIDE (POUR BTL) OPTIME
TOPICAL | Status: DC | PRN
  Administered 2023-01-01: 1000 mL

## 2023-01-01 MED ORDER — OXYCODONE HCL 5 MG PO TABS: 5.0000 mg | ORAL_TABLET | Freq: Once | ORAL | Status: DC | PRN

## 2023-01-01 MED ORDER — DEXTROSE 5 % IV SOLN
INTRAVENOUS | Status: DC | PRN
  Administered 2023-01-01: 3 g via INTRAVENOUS

## 2023-01-01 MED ORDER — HEPARIN SODIUM (PORCINE) 1000 UNIT/ML DIALYSIS
1000.0000 [IU] | INTRAMUSCULAR | Status: DC | PRN
Start: 2023-01-01 — End: 2023-01-01
  Administered 2023-01-01: 3200 [IU]
  Filled 2023-01-01 (×3): qty 1

## 2023-01-01 MED ORDER — SODIUM CHLORIDE 0.9 % IV SOLN: INTRAVENOUS | Status: AC

## 2023-01-01 MED ORDER — PHENYLEPHRINE 80 MCG/ML (10ML) SYRINGE FOR IV PUSH (FOR BLOOD PRESSURE SUPPORT)
PREFILLED_SYRINGE | INTRAVENOUS | Status: DC | PRN
  Administered 2023-01-01 (×5): 160 ug via INTRAVENOUS

## 2023-01-01 MED ORDER — FENTANYL CITRATE (PF) 250 MCG/5ML IJ SOLN
INTRAMUSCULAR | Status: DC | PRN
  Administered 2023-01-01 (×2): 25 ug via INTRAVENOUS

## 2023-01-01 MED ORDER — ANTICOAGULANT SODIUM CITRATE 4% (200MG/5ML) IV SOLN
5.0000 mL | Status: DC | PRN
Start: 2023-01-01 — End: 2023-01-01

## 2023-01-01 MED ORDER — SODIUM CHLORIDE 0.9 % IV SOLN: INTRAVENOUS | Status: DC | PRN

## 2023-01-01 MED ORDER — FENTANYL CITRATE (PF) 100 MCG/2ML IJ SOLN: 25.0000 ug | INTRAMUSCULAR | Status: DC | PRN

## 2023-01-01 MED ORDER — LIDOCAINE HCL 1 % IJ SOLN
INTRAMUSCULAR | Status: DC | PRN
  Administered 2023-01-01: 1 mL

## 2023-01-01 MED ORDER — LIDOCAINE 2% (20 MG/ML) 5 ML SYRINGE
INTRAMUSCULAR | Status: DC | PRN
  Administered 2023-01-01: 100 mg via INTRAVENOUS

## 2023-01-01 MED ORDER — LIDOCAINE 2% (20 MG/ML) 5 ML SYRINGE
INTRAMUSCULAR | Status: AC
  Filled 2023-01-01: qty 5

## 2023-01-01 MED ORDER — PENTAFLUOROPROP-TETRAFLUOROETH EX AERO
1.0000 | INHALATION_SPRAY | CUTANEOUS | Status: DC | PRN
Start: 2023-01-01 — End: 2023-01-01

## 2023-01-01 MED ORDER — ORAL CARE MOUTH RINSE: 15.0000 mL | Freq: Once | OROMUCOSAL | Status: AC

## 2023-01-01 MED ORDER — HEPARIN SODIUM (PORCINE) 1000 UNIT/ML IJ SOLN
INTRAMUSCULAR | Status: DC | PRN
  Administered 2023-01-01: 3200 [IU]

## 2023-01-01 SURGICAL SUPPLY — 49 items
ADH SKN CLS APL DERMABOND .7 (GAUZE/BANDAGES/DRESSINGS) ×1
APPLIER CLIP 9.375 MED OPEN (MISCELLANEOUS) IMPLANT
APPLIER CLIP 9.375 SM OPEN (CLIP) IMPLANT
APR CLP MED 9.3 20 MLT OPN (MISCELLANEOUS)
APR CLP SM 9.3 20 MLT OPN (CLIP)
BAG COUNTER SPONGE SURGICOUNT (BAG) ×1 IMPLANT
BAG DECANTER FOR FLEXI CONT (MISCELLANEOUS) ×1 IMPLANT
BAG SPNG CNTER NS LX DISP (BAG) ×1
BIOPATCH RED 1 DISK 7.0 (GAUZE/BANDAGES/DRESSINGS) ×1 IMPLANT
CATH PALINDROME-P 19CM W/VT (CATHETERS) IMPLANT
CATH PALINDROME-P 23CM W/VT (CATHETERS) IMPLANT
CATH PALINDROME-P 28CM W/VT (CATHETERS) IMPLANT
CATH STRAIGHT 5FR 65CM (CATHETERS) IMPLANT
CLIP APPLIE 9.375 MED OPEN (MISCELLANEOUS) ×1 IMPLANT
CLIP APPLIE 9.375 SM OPEN (CLIP) ×1 IMPLANT
COVER PROBE W GEL 5X96 (DRAPES) ×1 IMPLANT
COVER SURGICAL LIGHT HANDLE (MISCELLANEOUS) ×1 IMPLANT
DERMABOND ADVANCED .7 DNX12 (GAUZE/BANDAGES/DRESSINGS) ×1 IMPLANT
DRAPE C-ARM 42X72 X-RAY (DRAPES) ×1 IMPLANT
DRAPE CHEST BREAST 15X10 FENES (DRAPES) ×1 IMPLANT
GAUZE 4X4 16PLY ~~LOC~~+RFID DBL (SPONGE) ×1 IMPLANT
GLOVE BIOGEL PI IND STRL 8 (GLOVE) ×1 IMPLANT
GOWN STRL REUS W/ TWL LRG LVL3 (GOWN DISPOSABLE) ×2 IMPLANT
GOWN STRL REUS W/TWL 2XL LVL3 (GOWN DISPOSABLE) ×2 IMPLANT
GOWN STRL REUS W/TWL LRG LVL3 (GOWN DISPOSABLE) ×2
KIT BASIN OR (CUSTOM PROCEDURE TRAY) ×1 IMPLANT
KIT PALINDROME-P 55CM (CATHETERS) IMPLANT
KIT TURNOVER KIT B (KITS) ×1 IMPLANT
NDL 18GX1X1/2 (RX/OR ONLY) (NEEDLE) ×1 IMPLANT
NDL HYPO 25GX1X1/2 BEV (NEEDLE) ×1 IMPLANT
NEEDLE 18GX1X1/2 (RX/OR ONLY) (NEEDLE) ×1 IMPLANT
NEEDLE HYPO 25GX1X1/2 BEV (NEEDLE) ×1 IMPLANT
NS IRRIG 1000ML POUR BTL (IV SOLUTION) ×1 IMPLANT
PACK BASIC III (CUSTOM PROCEDURE TRAY) ×1
PACK SRG BSC III STRL LF ECLPS (CUSTOM PROCEDURE TRAY) ×1 IMPLANT
PAD ARMBOARD 7.5X6 YLW CONV (MISCELLANEOUS) ×2 IMPLANT
SET MICROPUNCTURE 5F STIFF (MISCELLANEOUS) IMPLANT
SOAP 2 % CHG 4 OZ (WOUND CARE) ×1 IMPLANT
SPIKE FLUID TRANSFER (MISCELLANEOUS) ×1 IMPLANT
SUT ETHILON 3 0 PS 1 (SUTURE) ×1 IMPLANT
SUT MNCRL AB 4-0 PS2 18 (SUTURE) ×1 IMPLANT
SYR 10ML LL (SYRINGE) ×1 IMPLANT
SYR 20ML LL LF (SYRINGE) ×2 IMPLANT
SYR 5ML LL (SYRINGE) ×1 IMPLANT
SYR CONTROL 10ML LL (SYRINGE) ×1 IMPLANT
TOWEL GREEN STERILE (TOWEL DISPOSABLE) ×1 IMPLANT
TOWEL GREEN STERILE FF (TOWEL DISPOSABLE) ×1 IMPLANT
WATER STERILE IRR 1000ML POUR (IV SOLUTION) ×1 IMPLANT
WIRE AMPLATZ SS-J .035X180CM (WIRE) IMPLANT

## 2023-01-01 NOTE — Plan of Care (Signed)
  Problem: Education: Goal: Ability to demonstrate management of disease process will improve Outcome: Progressing Goal: Ability to verbalize understanding of medication therapies will improve Outcome: Progressing Goal: Individualized Educational Video(s) Outcome: Progressing   Problem: Activity: Goal: Capacity to carry out activities will improve Outcome: Progressing   Problem: Cardiac: Goal: Ability to achieve and maintain adequate cardiopulmonary perfusion will improve Outcome: Progressing   Problem: Education: Goal: Ability to describe self-care measures that may prevent or decrease complications (Diabetes Survival Skills Education) will improve Outcome: Progressing Goal: Individualized Educational Video(s) Outcome: Progressing   Problem: Coping: Goal: Ability to adjust to condition or change in health will improve Outcome: Progressing   Problem: Fluid Volume: Goal: Ability to maintain a balanced intake and output will improve Outcome: Progressing   Problem: Health Behavior/Discharge Planning: Goal: Ability to identify and utilize available resources and services will improve Outcome: Progressing Goal: Ability to manage health-related needs will improve Outcome: Progressing   Problem: Metabolic: Goal: Ability to maintain appropriate glucose levels will improve Outcome: Progressing   Problem: Nutritional: Goal: Maintenance of adequate nutrition will improve Outcome: Progressing Goal: Progress toward achieving an optimal weight will improve Outcome: Progressing   Problem: Skin Integrity: Goal: Risk for impaired skin integrity will decrease Outcome: Progressing   Problem: Tissue Perfusion: Goal: Adequacy of tissue perfusion will improve Outcome: Progressing   Problem: Education: Goal: Knowledge of General Education information will improve Description: Including pain rating scale, medication(s)/side effects and non-pharmacologic comfort measures Outcome:  Progressing   Problem: Health Behavior/Discharge Planning: Goal: Ability to manage health-related needs will improve Outcome: Progressing   Problem: Clinical Measurements: Goal: Ability to maintain clinical measurements within normal limits will improve Outcome: Progressing Goal: Will remain free from infection Outcome: Progressing Goal: Diagnostic test results will improve Outcome: Progressing Goal: Respiratory complications will improve Outcome: Progressing Goal: Cardiovascular complication will be avoided Outcome: Progressing   Problem: Activity: Goal: Risk for activity intolerance will decrease Outcome: Progressing   Problem: Nutrition: Goal: Adequate nutrition will be maintained Outcome: Progressing   Problem: Coping: Goal: Level of anxiety will decrease Outcome: Progressing   Problem: Elimination: Goal: Will not experience complications related to bowel motility Outcome: Progressing Goal: Will not experience complications related to urinary retention Outcome: Progressing   Problem: Pain Management: Goal: General experience of comfort will improve Outcome: Progressing   Problem: Safety: Goal: Ability to remain free from injury will improve Outcome: Progressing   Problem: Skin Integrity: Goal: Risk for impaired skin integrity will decrease Outcome: Progressing

## 2023-01-01 NOTE — Progress Notes (Signed)
  Daily Progress Note   Subjective: No complaints  Objective: Vitals:   01/01/23 0742 01/01/23 0844  BP: (!) 158/98 (!) 160/107  Pulse: 79 79  Resp: 18 18  Temp:  97.9 F (36.6 C)  SpO2: 99% 98%    Physical Examination Nonlabored breathing Comfortable Regular rate Palpable radial pulses bilaterally.   ASSESSMENT/PLAN:  Pt with end stage renal disease in need of long term HD access.  Pt not optimized for elective fistula. Discussed tunneled dialysis cathter only today.  Happy to RTOR for fistula creation prior to discharge once optimized.   Victorino Sparrow MD [facility] [phone] 01/01/2023  9:01 AM

## 2023-01-01 NOTE — Progress Notes (Signed)
CCC Pre-op Review  Pre-op checklist:   NPO:    yes   Labs:  11/12 @0509   H/H 8.5 / 27  plts 446                                            K 5.7*  Glu 219*                                            Cr 7.47 / bun 76                     11/10 trop 1  420*  Consent:  completed  H&P:   needs update  Vitals:  @ 0328  98.9 p 79 r 16  167/110 (124)  O2 requirements:  RA 99%  MAR/PTA review:   heparin SQ HELD                             @0538   Hydralazine 25mg  po  IV:     20g LFA  Floor nurse name:    Olena Heckle LPN  [phone]  Additional info:    DM 2  on insulin                               BILAT BKA                                CRF

## 2023-01-01 NOTE — Progress Notes (Signed)
Received patient in bed to unit.  Alert and oriented.  Informed consent signed and in chart.   TX duration: 2:00  Patient tolerated well.  Transported back to the room  Alert, without acute distress.  Hand-off given to patient's nurse.   Access used: [facility] Access issues: None  Total UF removed: None Medication(s) given: None Post HD VS: please see data insert    01/01/23 2200  Vitals  Temp 99.6 F (37.6 C)  Temp Source Oral  BP (!) 146/96  MAP (mmHg) 111  BP Location Right Arm  BP Method Automatic  Patient Position (if appropriate) Lying  Pulse Rate 88  Pulse Rate Source Monitor  ECG Heart Rate 90  Resp (!) 27  Oxygen Therapy  SpO2 99 %  O2 Device Room Air  Patient Activity (if Appropriate) In bed  Pulse Oximetry Type Continuous  Post Treatment  Dialyzer Clearance Lightly streaked  Hemodialysis Intake (mL) 0 mL  Liters Processed 24  Fluid Removed (mL) 0 mL  Tolerated HD Treatment Yes  Post-Hemodialysis Comments Treatment completed and blood returned without issue.  Note  Patient Observations Patient alert, no c/o voiced, no acute distress noted; patient condition stable for return transport.  Hemodialysis Catheter Right Internal jugular Double lumen Permanent (Tunneled)  Placement Date/Time: 01/01/23 1026   Placed prior to admission: No  Serial / Lot #: [id number]  Expiration Date: 09/18/27  Time Out: Correct patient;Correct site;Correct procedure  Maximum sterile barrier precautions: Hand hygiene;Cap;Large sterile sh...  Site Condition No complications  Blue Lumen Status Flushed;Heparin locked;Dead end cap in place  Red Lumen Status Flushed;Heparin locked;Dead end cap in place  Purple Lumen Status N/A  Catheter fill solution Heparin 1000 units/ml  Catheter fill volume (Arterial) 1.6 cc  Catheter fill volume (Venous) 1.6  Dressing Type Gauze/Drain sponge  Dressing Status Clean, Dry, Intact  Drainage Description None  Post treatment catheter status Capped  and Clamped      [facility]

## 2023-01-01 NOTE — Transfer of Care (Signed)
Immediate Anesthesia Transfer of Care Note  Patient: Patrick [name]  Procedure(s) Performed: INSERTION OF TUNNELED  DIALYSIS CATHETER RIGHT INTERNAL JUGULAR VEIN  Patient Location: PACU  Anesthesia Type:General  Level of Consciousness: drowsy and patient cooperative  Airway & Oxygen Therapy: Patient Spontanous Breathing and Patient connected to face mask oxygen  Post-op Assessment: Report given to RN and Post -op Vital signs reviewed and stable  Post vital signs: Reviewed and stable  Last Vitals:  Vitals Value Taken Time  BP 133/91 01/01/23 1047  Temp    Pulse 82 01/01/23 1049  Resp 14 01/01/23 1049  SpO2 98 % 01/01/23 1049  Vitals shown include unfiled device data.  Last Pain:  Vitals:   01/01/23 0844  TempSrc: Oral  PainSc: 0-No pain      Patients Stated Pain Goal: 6 (12/31/22 2209)  Complications: No notable events documented.

## 2023-01-01 NOTE — Progress Notes (Signed)
Requested to see pt for out-pt HD needs at d/c. Met with pt at bedside. Introduced self and explained role. Pt lives in [place] but works in [place]. Pt would like to go to [facility] if possible. Discussed options for HD at [facility]- TCU vs in-center. Pt voiced interest in TCU due to possible interest for home therapy options in the future. Pt states he drives and will drive self to HD appts at d/c. Pt aware TCU is 4 days a week program and agreeable. Referral submitted to [facility] admissions for review. Will assist as needed.   Olivia Canter Renal Navigator [phone]
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
## 2023-01-01 NOTE — Progress Notes (Signed)
Nephrology Follow-Up Consult note   Assessment/Recommendations: Patrick [name] is a/an 57 y.o. male with a past medical history significant for HTN, HLD, DM2, CKD 4, gout, bilateral BKA, CVA who present w/ shortness of breath, elevated troponin, and worsening CKD    ESRD: Underlying DKD.  Possibly some AKI component from NSAID use but more likely progressive CKD. Renal US no obstruction. Creatinine was 4.7 in May and creatinine is now 7.5.  This rate of progression would be similar to what he has seen in the past.  Follows with [facility] but did not attend visit in the past year. I suspect he is ESRD now.  -I have recommended he initiate dialysis during this admission -- he has many electrolyte abnormalities, eGFR 8 and early uremic symptoms.  I'm concerned if he delays initiation he may end up presenting in an emergency situation.  Additionally, he had not been attending outpt f/u as it is -consulted VVS for [facility] and perm access; appreciate consult - vein mapping has been ordered - plan is HD catheter placement here and perm access later outpt.  -CLIP to outpt HD -Continue to monitor daily Cr, Dose meds for GFR -Monitor Daily I/Os, Daily weight  -Maintain MAP>65 for optimal renal perfusion.  -Avoid nephrotoxic medications including NSAIDs -Use synthetic opioids (Fentanyl/Dilaudid) if needed   Metabolic acidosis: Associated with CKD.  Continue sodium bicarbonate 1300 mg twice daily until dialysis initiation.   Hypocalcemia: Likely related to secondary hyperparathyroidism with CKD.  PTH 350.  Started calcitriol.  Remains low despite IV ca, started oral Ca and is improving.    Anemia: Likely multifactorial.  Iron fairly depleted.  Receiving IV iron.  Start ESA with HD.   Shortness of breath: Likely some volume overload despite reassuring chest x-ray. Improved with diuresis. Changed to po diuretic.  Can UF with HD now too if neded.   NSTEMI: Cardiology feels it is demand.  Appreciate their help  Tyler Pita [facility] 01/01/2023 8:59 AM  ___________________________________________________________  CC: Chest tightness  Interval History/Subjective: No new issues.  NPO for HD catheter placement today. UOP 2.3L yesterday.  Labs unchanged.    Medications:  Current Facility-Administered Medications  Medication Dose Route Frequency Provider Last Rate Last Admin   0.9 %  sodium chloride infusion   Intravenous Continuous Linton Rump, MD       0.9 %  sodium chloride infusion   Intravenous Continuous Linton Rump, MD       [facility] Hold] acetaminophen (TYLENOL) tablet 650 mg  650 mg Oral Q6H PRN Synetta Fail, MD       Or   [facility] Hold] acetaminophen (TYLENOL) suppository 650 mg  650 mg Rectal Q6H PRN Synetta Fail, MD       [MAR Hold] albuterol (PROVENTIL) (2.5 MG/3ML) 0.083% nebulizer solution 2.5 mg  2.5 mg Nebulization Q4H PRN Synetta Fail, MD       [MAR Hold] amLODipine (NORVASC) tablet 5 mg  5 mg Oral Daily Synetta Fail, MD   5 mg at 12/31/22 6295   [facility] Hold] calcitRIOL (ROCALTROL) capsule 0.5 mcg  0.5 mcg Oral Daily Darnell Level, MD   0.5 mcg at 12/31/22 0910   [MAR Hold] calcium carbonate (TUMS - dosed in mg elemental calcium) chewable tablet 200 mg of elemental calcium  1 tablet Oral TID Tyler Pita, MD   200 mg of elemental calcium at 12/31/22 2158   [MAR Hold] Chlorhexidine Gluconate Cloth 2 % PADS  6 each  6 each Topical Q0600 Tyler Pita, MD   6 each at 01/01/23 0541   [MAR Hold] gabapentin (NEURONTIN) capsule 300 mg  300 mg Oral Daily PRN Synetta Fail, MD   300 mg at 12/29/22 2034   [facility] Hold] heparin injection 5,000 Units  5,000 Units Subcutaneous Q8H Synetta Fail, MD   5,000 Units at 12/31/22 2200   [MAR Hold] hydrALAZINE (APRESOLINE) tablet 25 mg  25 mg Oral Q8H Khatri, Pardeep, MD   25 mg at 01/01/23 0538   [MAR Hold] influenza vac split trivalent PF (FLULAVAL) injection 0.5 mL  0.5  mL Intramuscular Tomorrow-1000 Synetta Fail, MD       [facility] Hold] insulin aspart (novoLOG) injection 0-15 Units  0-15 Units Subcutaneous TID [facility] Dolly Rias, MD   3 Units at 01/01/23 0823   [MAR Hold] insulin aspart (novoLOG) injection 0-5 Units  0-5 Units Subcutaneous QHS Dolly Rias, MD   4 Units at 12/31/22 2149   [MAR Hold] insulin glargine-yfgn (SEMGLEE) injection 15 Units  15 Units Subcutaneous Daily Synetta Fail, MD   15 Units at 12/31/22 0923   [MAR Hold] iron sucrose (VENOFER) 200 mg in sodium chloride 0.9 % 100 mL IVPB  200 mg Intravenous Q24H Darnell Level, MD   Stopped at 12/31/22 1318   [MAR Hold] metoprolol tartrate (LOPRESSOR) tablet 25 mg  25 mg Oral BID Synetta Fail, MD   25 mg at 12/31/22 2159   [MAR Hold] nitroGLYCERIN (NITROSTAT) SL tablet 0.4 mg  0.4 mg Sublingual Q5 min PRN Synetta Fail, MD   0.4 mg at 12/29/22 1739   [MAR Hold] oxyCODONE (Oxy IR/ROXICODONE) immediate release tablet 5 mg  5 mg Oral Q6H PRN Willeen Niece, MD   5 mg at 12/31/22 2214   [MAR Hold] polyethylene glycol (MIRALAX / GLYCOLAX) packet 17 g  17 g Oral Daily PRN Synetta Fail, MD       [MAR Hold] predniSONE (DELTASONE) tablet 20 mg  20 mg Oral BID WC Synetta Fail, MD   20 mg at 12/31/22 1732   [MAR Hold] simvastatin (ZOCOR) tablet 10 mg  10 mg Oral QHS Synetta Fail, MD   10 mg at 12/31/22 2206   [facility] Hold] sodium bicarbonate tablet 1,300 mg  1,300 mg Oral BID Darnell Level, MD   1,300 mg at 12/31/22 2153   [facility] Hold] sodium chloride flush (NS) 0.9 % injection 3 mL  3 mL Intravenous Q12H Synetta Fail, MD   3 mL at 12/31/22 2200   [MAR Hold] sodium zirconium cyclosilicate (LOKELMA) packet 10 g  10 g Oral Once Tyler Pita, MD       [facility] Hold] torsemide [facility]) tablet 100 mg  100 mg Oral Daily Tyler Pita, MD       Facility-Administered Medications Ordered in Other Encounters  Medication Dose Route Frequency Provider Last  Rate Last Admin   ketamine (KETALAR) bolus via infusion 68.05 mg  0.5 mg/kg Intravenous Once Nadara Mustard, MD          Review of Systems: 10 systems reviewed and negative except per interval history/subjective  Physical Exam: Vitals:   01/01/23 0742 01/01/23 0844  BP: (!) 158/98 (!) 160/107  Pulse: 79 79  Resp: 18 18  Temp:  97.9 F (36.6 C)  SpO2: 99% 98%   No intake/output data recorded.  Intake/Output Summary (Last 24 hours) at 01/01/2023 0859 Last data filed at 12/31/2022  2217 Gross per 24 hour  Intake 353 ml  Output 1575 ml  Net -1222 ml   Constitutional: well-appearing, no acute distress ENMT: ears and nose without scars or lesions, MMM CV: normal rate, no edema Respiratory: on RA with normal WOB Gastrointestinal: soft, non-tender, no palpable masses or hernias Skin: no visible lesions or rashes Psych: alert, judgement/insight appropriate, appropriate mood and affect   Test Results I personally reviewed new and old clinical labs and radiology tests Lab Results  Component Value Date   NA 136 01/01/2023   K 5.7 (H) 01/01/2023   CL 104 01/01/2023   CO2 18 (L) 01/01/2023   BUN 76 (H) 01/01/2023   CREATININE 7.47 (H) 01/01/2023   CALCIUM 6.5 (L) 01/01/2023   ALBUMIN 2.8 (L) 01/01/2023   PHOS 6.3 (H) 01/01/2023    CBC Recent Labs  Lab 12/29/22 1516 12/30/22 0308 12/31/22 0636 01/01/23 0509  WBC 14.2* 14.1* 17.6* 20.2*  NEUTROABS 11.5*  --   --   --   HGB 8.5* 8.0* 7.9* 8.5*  HCT 27.7* 26.0* 25.4* 27.0*  MCV 89.9 87.5 86.7 86.5  PLT 393 373 426* 446*

## 2023-01-01 NOTE — Op Note (Signed)
    NAME: CURRIE HEDRICK    MRN: [id number] DOB: 07-Dec-1965    DATE OF OPERATION: 01/01/2023  PREOP DIAGNOSIS:    End stage renal disease  POSTOP DIAGNOSIS:    Same  PROCEDURE:    Right internal jugular tunneled dialysis catheter placement 19cm palindrome  SURGEON: Victorino Sparrow  ASSIST: none  ANESTHESIA: General   EBL: 10ml  INDICATIONS:    AHMANI ALVARADO is a 57 y.o. male with end stage renal disease in need fo long term HD access. Pt not optimized for fistula creation at this time. After discussing the risks and benefits of tunneled dialysis cathter placement, Remus elected to proceed.   FINDINGS:    Large right internal jugular vein  TECHNIQUE:   Using ultrasound guidance the right internal jugular vein was accessed with micropuncture technique.  Through the micropuncture sheath a floppy J-wire was advanced into the superior vena cava.  A small incision was made around the skin access point.  A counterincision was made in the chest under the clavicle.  A 19 cm tunneled dialysis catheter was then tunneled under the skin, over the clavicle into the incision in the neck.  The access point was serially dilated under direct fluoroscopic guidance.  A peel-away sheath was introduced into the superior vena cava under fluoroscopic guidance.  The tunneling device was removed and the catheter fed through the peel-away sheath into the superior vena cava.  The peel-away sheath was removed and the catheter gently pulled back.  Adequate position was confirmed with x-ray.  The catheter was tested and found to flush and draw back well.  Catheter was heparin locked.  Caps were applied.  Catheter was sutured to the skin.  The neck incision was closed with 4-0 Monocryl.   Victorino Sparrow, MD [facility] DATE OF DICTATION:   01/01/2023

## 2023-01-01 NOTE — Anesthesia Procedure Notes (Signed)
Procedure Name: LMA Insertion Date/Time: 01/01/2023 10:00 AM  Performed by: Audie Pinto, CRNAPre-anesthesia Checklist: Patient identified, Emergency Drugs available, Suction available and Patient being monitored Patient Re-evaluated:Patient Re-evaluated prior to induction Oxygen Delivery Method: Circle system utilized Preoxygenation: Pre-oxygenation with 100% oxygen Induction Type: IV induction LMA: LMA inserted LMA Size: 5.0 Placement Confirmation: positive ETCO2 Dental Injury: Teeth and Oropharynx as per pre-operative assessment

## 2023-01-01 NOTE — Anesthesia Postprocedure Evaluation (Signed)
Anesthesia Post Note  Patient: Patrick [name]  Procedure(s) Performed: INSERTION OF TUNNELED  DIALYSIS CATHETER RIGHT INTERNAL JUGULAR VEIN     Patient location during evaluation: PACU Anesthesia Type: General Level of consciousness: awake Pain management: pain level controlled Vital Signs Assessment: post-procedure vital signs reviewed and stable Respiratory status: spontaneous breathing, nonlabored ventilation and respiratory function stable Cardiovascular status: blood pressure returned to baseline and stable Postop Assessment: no apparent nausea or vomiting Anesthetic complications: no   No notable events documented.  Last Vitals:  Vitals:   01/01/23 1115 01/01/23 1506  BP: (!) 141/97 (!) 168/100  Pulse: 79 81  Resp: 11 18  Temp: (!) 36.3 C   SpO2: 97% 100%    Last Pain:  Vitals:   01/01/23 1047  TempSrc:   PainSc: 0-No pain                 Linton Rump

## 2023-01-02 ENCOUNTER — Encounter (HOSPITAL_COMMUNITY): Payer: Self-pay | Admitting: Vascular Surgery

## 2023-01-02 DIAGNOSIS — D649 Anemia, unspecified: Secondary | ICD-10-CM | POA: Diagnosis not present

## 2023-01-02 DIAGNOSIS — N179 Acute kidney failure, unspecified: Secondary | ICD-10-CM | POA: Diagnosis not present

## 2023-01-02 DIAGNOSIS — Z89511 Acquired absence of right leg below knee: Secondary | ICD-10-CM | POA: Diagnosis not present

## 2023-01-02 DIAGNOSIS — E669 Obesity, unspecified: Secondary | ICD-10-CM | POA: Diagnosis not present

## 2023-01-02 LAB — CBC
HCT: 25.7 % — ABNORMAL LOW (ref 39.0–52.0)
Hemoglobin: 8.1 g/dL — ABNORMAL LOW (ref 13.0–17.0)
MCH: 27.4 pg (ref 26.0–34.0)
MCHC: 31.5 g/dL (ref 30.0–36.0)
MCV: 86.8 fL (ref 80.0–100.0)
Platelets: 419 10*3/uL — ABNORMAL HIGH (ref 150–400)
RBC: 2.96 MIL/uL — ABNORMAL LOW (ref 4.22–5.81)
RDW: 14 % (ref 11.5–15.5)
WBC: 16.1 10*3/uL — ABNORMAL HIGH (ref 4.0–10.5)
nRBC: 0.1 % (ref 0.0–0.2)

## 2023-01-02 LAB — BASIC METABOLIC PANEL
Anion gap: 12 (ref 5–15)
BUN: 69 mg/dL — ABNORMAL HIGH (ref 6–20)
CO2: 19 mmol/L — ABNORMAL LOW (ref 22–32)
Calcium: 6.3 mg/dL — CL (ref 8.9–10.3)
Chloride: 104 mmol/L (ref 98–111)
Creatinine, Ser: 6.52 mg/dL — ABNORMAL HIGH (ref 0.61–1.24)
GFR, Estimated: 9 mL/min — ABNORMAL LOW (ref >60)
Glucose, Bld: 311 mg/dL — ABNORMAL HIGH (ref 70–99)
Potassium: 5.1 mmol/L (ref 3.5–5.1)
Sodium: 135 mmol/L (ref 135–145)

## 2023-01-02 LAB — HEPATITIS B SURFACE ANTIBODY, QUANTITATIVE: Hep B S AB Quant (Post): <3.5 m[IU]/mL — ABNORMAL LOW

## 2023-01-02 LAB — GLUCOSE, CAPILLARY
Glucose-Capillary: 236 mg/dL — ABNORMAL HIGH (ref 70–99)
Glucose-Capillary: 177 mg/dL — ABNORMAL HIGH (ref 70–99)
Glucose-Capillary: 253 mg/dL — ABNORMAL HIGH (ref 70–99)
Glucose-Capillary: 315 mg/dL — ABNORMAL HIGH (ref 70–99)

## 2023-01-02 LAB — MAGNESIUM: Magnesium: 1.4 mg/dL — ABNORMAL LOW (ref 1.7–2.4)

## 2023-01-02 LAB — PHOSPHORUS: Phosphorus: 5.4 mg/dL — ABNORMAL HIGH (ref 2.5–4.6)

## 2023-01-02 MED ORDER — SODIUM BICARBONATE 650 MG PO TABS: 1300.0000 mg | ORAL_TABLET | Freq: Two times a day (BID) | ORAL | 1 refills | Status: DC

## 2023-01-02 MED ORDER — HYDRALAZINE HCL 25 MG PO TABS: 25.0000 mg | ORAL_TABLET | Freq: Three times a day (TID) | ORAL | 1 refills | Status: DC

## 2023-01-02 MED ORDER — CALCIUM GLUCONATE-NACL 2-0.675 GM/100ML-% IV SOLN
2.0000 g | Freq: Once | INTRAVENOUS | Status: AC
  Administered 2023-01-02: 2000 mg via INTRAVENOUS
  Filled 2023-01-02: qty 100

## 2023-01-02 MED ORDER — PREDNISONE 20 MG PO TABS
20.0000 mg | ORAL_TABLET | Freq: Every day | ORAL | 0 refills | Status: DC
Start: 2023-01-03 — End: 2023-01-03

## 2023-01-02 MED ORDER — CALCIUM CARBONATE ANTACID 500 MG PO CHEW: 1.0000 | CHEWABLE_TABLET | Freq: Three times a day (TID) | ORAL | 1 refills | Status: DC

## 2023-01-02 MED ORDER — TORSEMIDE 100 MG PO TABS: 100.0000 mg | ORAL_TABLET | Freq: Every day | ORAL | 1 refills | Status: DC

## 2023-01-02 MED ORDER — SIMVASTATIN 10 MG PO TABS: 10.0000 mg | ORAL_TABLET | Freq: Every day | ORAL | 3 refills | Status: DC

## 2023-01-02 MED ORDER — CALCITRIOL 0.5 MCG PO CAPS: 0.5000 ug | ORAL_CAPSULE | Freq: Every day | ORAL | 1 refills | Status: DC

## 2023-01-02 MED ORDER — MAGNESIUM SULFATE 2 GM/50ML IV SOLN
2.0000 g | Freq: Once | INTRAVENOUS | Status: AC
  Administered 2023-01-02: 2 g via INTRAVENOUS
  Filled 2023-01-02: qty 50

## 2023-01-02 MED ORDER — BASAGLAR KWIKPEN 100 UNIT/ML ~~LOC~~ SOPN: 10.0000 [IU] | PEN_INJECTOR | Freq: Every day | SUBCUTANEOUS | 1 refills | Status: DC

## 2023-01-02 NOTE — Progress Notes (Signed)
Nephrology Follow-Up Consult note   Assessment/Recommendations: Patrick [name] is a/an 57 y.o. male with a past medical history significant for HTN, HLD, DM2, CKD 4, gout, bilateral BKA, CVA who present w/ shortness of breath, elevated troponin, and worsening CKD    ESRD: Underlying DKD. Progressive CKD. Renal US no obstruction. Creatinine was 4.7 in May and creatinine is now 7.5.  This rate of progression would be similar to what he has seen in the past.  Follows with [facility] but did not attend visit in the past year. I suspect he is ESRD now.  -I recommended he initiate dialysis during this admission -- he has many electrolyte abnormalities, eGFR 8 and early uremic symptoms.  I'm concerned if he delays initiation he may end up presenting in an emergency situation.  Additionally, he had not been attending outpt f/u as it is -consulted VVS for [facility] and perm access; appreciate consult - 11/12 HD catheter placement here and perm access later outpt.  -CLIP to outpt HD -Continue to monitor daily Cr, Dose meds for GFR -Monitor Daily I/Os, Daily weight  -Maintain MAP>65 for optimal renal perfusion.  -Avoid nephrotoxic medications including NSAIDs -Use synthetic opioids (Fentanyl/Dilaudid) if needed   Metabolic acidosis: d/c po bicarb now that on dialysis   Hypocalcemia: Likely related to secondary hyperparathyroidism with CKD.  PTH 350.  Started calcitriol and oral ca.   Anemia: Likely multifactorial.  Iron fairly depleted.  Receiving IV iron.  ESA with HD.   Shortness of breath: Likely some volume overload despite reassuring chest x-ray. Improved with diuresis. Changed to po diuretic.  UF with HD as well.  Will need to est EDW.   NSTEMI: Cardiology feels it is demand.  Appreciate their help  Tyler Pita [facility] 01/02/2023 11:25 AM  ___________________________________________________________  CC: Chest tightness  Interval History/Subjective: Tolerated HD catheter  placement and HD#1 fine yesterday. UOP 0.5L yesterday. No new issues, for HD 2 today.   Medications:  Current Facility-Administered Medications  Medication Dose Route Frequency Provider Last Rate Last Admin   acetaminophen (TYLENOL) tablet 650 mg  650 mg Oral Q6H PRN Synetta Fail, MD       Or   acetaminophen (TYLENOL) suppository 650 mg  650 mg Rectal Q6H PRN Synetta Fail, MD       albuterol (PROVENTIL) (2.5 MG/3ML) 0.083% nebulizer solution 2.5 mg  2.5 mg Nebulization Q4H PRN Synetta Fail, MD       amLODipine (NORVASC) tablet 5 mg  5 mg Oral Daily Synetta Fail, MD   5 mg at 01/02/23 1000   calcitRIOL (ROCALTROL) capsule 0.5 mcg  0.5 mcg Oral Daily Darnell Level, MD   0.5 mcg at 01/02/23 1000   calcium carbonate (TUMS - dosed in mg elemental calcium) chewable tablet 200 mg of elemental calcium  1 tablet Oral TID Tyler Pita, MD   200 mg of elemental calcium at 01/02/23 1000   Chlorhexidine Gluconate Cloth 2 % PADS 6 each  6 each Topical Q0600 Tyler Pita, MD   6 each at 01/02/23 0527   gabapentin (NEURONTIN) capsule 300 mg  300 mg Oral Daily PRN Synetta Fail, MD   300 mg at 12/29/22 2034   heparin injection 5,000 Units  5,000 Units Subcutaneous Q8H Synetta Fail, MD   5,000 Units at 01/02/23 0524   hydrALAZINE (APRESOLINE) tablet 25 mg  25 mg Oral Q8H Willeen Niece, MD   25 mg at 01/02/23 [phone]  influenza vac split trivalent PF (FLULAVAL) injection 0.5 mL  0.5 mL Intramuscular Tomorrow-1000 Synetta Fail, MD       insulin aspart (novoLOG) injection 0-15 Units  0-15 Units Subcutaneous TID WC Dolly Rias, MD   5 Units at 01/02/23 0830   insulin aspart (novoLOG) injection 0-5 Units  0-5 Units Subcutaneous QHS Dolly Rias, MD   3 Units at 01/01/23 2309   insulin glargine-yfgn (SEMGLEE) injection 15 Units  15 Units Subcutaneous Daily Synetta Fail, MD   15 Units at 01/02/23 1000   iron sucrose (VENOFER) 200 mg in sodium  chloride 0.9 % 100 mL IVPB  200 mg Intravenous Q24H Darnell Level, MD   Stopped at 12/31/22 1318   metoprolol tartrate (LOPRESSOR) tablet 25 mg  25 mg Oral BID Synetta Fail, MD   25 mg at 01/02/23 1000   nitroGLYCERIN (NITROSTAT) SL tablet 0.4 mg  0.4 mg Sublingual Q5 min PRN Synetta Fail, MD   0.4 mg at 12/29/22 1739   oxyCODONE (Oxy IR/ROXICODONE) immediate release tablet 5 mg  5 mg Oral Q6H PRN Willeen Niece, MD   5 mg at 01/01/23 2306   polyethylene glycol (MIRALAX / GLYCOLAX) packet 17 g  17 g Oral Daily PRN Synetta Fail, MD       predniSONE (DELTASONE) tablet 20 mg  20 mg Oral BID WC Synetta Fail, MD   20 mg at 01/02/23 0830   simvastatin (ZOCOR) tablet 10 mg  10 mg Oral QHS Synetta Fail, MD   10 mg at 01/01/23 2307   sodium bicarbonate tablet 1,300 mg  1,300 mg Oral BID Darnell Level, MD   1,300 mg at 01/02/23 1000   sodium chloride flush (NS) 0.9 % injection 3 mL  3 mL Intravenous Q12H Synetta Fail, MD   3 mL at 01/02/23 1001   sodium zirconium cyclosilicate (LOKELMA) packet 10 g  10 g Oral Once Tyler Pita, MD       torsemide [facility]) tablet 100 mg  100 mg Oral Daily Tyler Pita, MD   100 mg at 01/02/23 1000   Facility-Administered Medications Ordered in Other Encounters  Medication Dose Route Frequency Provider Last Rate Last Admin   ketamine (KETALAR) bolus via infusion 68.05 mg  0.5 mg/kg Intravenous Once Nadara Mustard, MD          Review of Systems: 10 systems reviewed and negative except per interval history/subjective  Physical Exam: Vitals:   01/02/23 0519 01/02/23 0810  BP: (!) 164/94 (!) 150/90  Pulse: 79 80  Resp: 18 16  Temp: 98.5 F (36.9 C) 97.9 F (36.6 C)  SpO2: 100% 100%   Total I/O In: 237 [P.O.:237] Out: -   Intake/Output Summary (Last 24 hours) at 01/02/2023 1125 Last data filed at 01/02/2023 0830 Gross per 24 hour  Intake 343.28 ml  Output 500 ml  Net -156.72 ml   Constitutional:  well-appearing, no acute distress ENMT: ears and nose without scars or lesions, MMM CV: normal rate, no edema Respiratory: on RA with normal WOB Gastrointestinal: soft, non-tender, no palpable masses or hernias Skin: no visible lesions or rashes Psych: alert, judgement/insight appropriate, appropriate mood and affect   Test Results I personally reviewed new and old clinical labs and radiology tests Lab Results  Component Value Date   NA 135 01/02/2023   K 5.1 01/02/2023   CL 104 01/02/2023   CO2 19 (L) 01/02/2023   BUN 69 (H) 01/02/2023  CREATININE 6.52 (H) 01/02/2023   CALCIUM 6.3 (LL) 01/02/2023   ALBUMIN 2.8 (L) 01/01/2023   PHOS 5.4 (H) 01/02/2023    CBC Recent Labs  Lab 12/29/22 1516 12/30/22 0308 12/31/22 0636 01/01/23 0509 01/02/23 0510  WBC 14.2*   < > 17.6* 20.2* 16.1*  NEUTROABS 11.5*  --   --   --   --   HGB 8.5*   < > 7.9* 8.5* 8.1*  HCT 27.7*   < > 25.4* 27.0* 25.7*  MCV 89.9   < > 86.7 86.5 86.8  PLT 393   < > 426* 446* 419*   < > = values in this interval not displayed.

## 2023-01-02 NOTE — Progress Notes (Signed)
Call received from lab. Patient with critical calcium level of 6.3 this morning. On call provider notified

## 2023-01-02 NOTE — Plan of Care (Signed)
  Problem: Education: Goal: Ability to demonstrate management of disease process will improve Outcome: Progressing Goal: Ability to verbalize understanding of medication therapies will improve Outcome: Progressing Goal: Individualized Educational Video(s) Outcome: Progressing   Problem: Activity: Goal: Capacity to carry out activities will improve Outcome: Progressing   Problem: Cardiac: Goal: Ability to achieve and maintain adequate cardiopulmonary perfusion will improve Outcome: Progressing   Problem: Education: Goal: Ability to describe self-care measures that may prevent or decrease complications (Diabetes Survival Skills Education) will improve Outcome: Progressing Goal: Individualized Educational Video(s) Outcome: Progressing   Problem: Coping: Goal: Ability to adjust to condition or change in health will improve Outcome: Progressing   Problem: Fluid Volume: Goal: Ability to maintain a balanced intake and output will improve Outcome: Progressing   Problem: Health Behavior/Discharge Planning: Goal: Ability to identify and utilize available resources and services will improve Outcome: Progressing Goal: Ability to manage health-related needs will improve Outcome: Progressing   Problem: Metabolic: Goal: Ability to maintain appropriate glucose levels will improve Outcome: Progressing   Problem: Nutritional: Goal: Maintenance of adequate nutrition will improve Outcome: Progressing Goal: Progress toward achieving an optimal weight will improve Outcome: Progressing   Problem: Skin Integrity: Goal: Risk for impaired skin integrity will decrease Outcome: Progressing   Problem: Tissue Perfusion: Goal: Adequacy of tissue perfusion will improve Outcome: Progressing   Problem: Education: Goal: Knowledge of General Education information will improve Description: Including pain rating scale, medication(s)/side effects and non-pharmacologic comfort measures Outcome:  Progressing   Problem: Health Behavior/Discharge Planning: Goal: Ability to manage health-related needs will improve Outcome: Progressing   Problem: Clinical Measurements: Goal: Ability to maintain clinical measurements within normal limits will improve Outcome: Progressing Goal: Will remain free from infection Outcome: Progressing Goal: Diagnostic test results will improve Outcome: Progressing Goal: Respiratory complications will improve Outcome: Progressing Goal: Cardiovascular complication will be avoided Outcome: Progressing   Problem: Activity: Goal: Risk for activity intolerance will decrease Outcome: Progressing   Problem: Nutrition: Goal: Adequate nutrition will be maintained Outcome: Progressing   Problem: Coping: Goal: Level of anxiety will decrease Outcome: Progressing   Problem: Elimination: Goal: Will not experience complications related to bowel motility Outcome: Progressing Goal: Will not experience complications related to urinary retention Outcome: Progressing   Problem: Pain Management: Goal: General experience of comfort will improve Outcome: Progressing   Problem: Safety: Goal: Ability to remain free from injury will improve Outcome: Progressing   Problem: Skin Integrity: Goal: Risk for impaired skin integrity will decrease Outcome: Progressing

## 2023-01-02 NOTE — Progress Notes (Signed)
Please call prior to discharge for AVF. Concerned this may not occur in the outpt setting due to poor follow up.   Happy to perform AVF when optimized for home.   Victorino Sparrow MD

## 2023-01-02 NOTE — Progress Notes (Addendum)
Pt has been accepted at [facility] at [facility] on Mon, Tues, Thurs, Fri at 8:00 am chair time. Pt can start on Friday and will need to arrive at 7:15 am to complete paperwork prior to treatment. Met with pt at bedside to discuss details. Pt agreeable to plans and schedule letter provided. Will add arrangements to AVS as well. Update provided to attending, nephrologist, pt's RN, and RN CM. Will assist as needed.   Olivia Canter Renal Navigator [phone]  Addendum at 2:06 pm: Pt to d/c later today after HD. Contacted Renal PA to request that orders be sent to clinic at d/c.

## 2023-01-02 NOTE — Progress Notes (Signed)
Mobility Specialist Progress Note:   01/02/23 1500  Mobility  Activity Ambulated with assistance in hallway  Level of Assistance Contact guard assist, steadying assist  Assistive Device Four wheel walker  Distance Ambulated (ft) 150 ft  Activity Response Tolerated well  Mobility Referral Yes  $Mobility charge 1 Mobility  Mobility Specialist Start Time (ACUTE ONLY) 1500  Mobility Specialist Stop Time (ACUTE ONLY) 1517  Mobility Specialist Time Calculation (min) (ACUTE ONLY) 17 min   Pt agreeable to mobility session. No physical assistance required to don prosthetics. Required contact assist during ambulation d/t minor unsteadiness. No c/o throughout. Pt back sitting EOB with all needs met, friend in room.   Addison Lank Mobility Specialist Please contact via SecureChat or  Rehab office at [phone]

## 2023-01-02 NOTE — Inpatient Diabetes Management (Signed)
Inpatient Diabetes Program Recommendations  AACE/ADA: New Consensus Statement on Inpatient Glycemic Control (2015)  Target Ranges:  Prepandial:   less than 140 mg/dL      Peak postprandial:   less than 180 mg/dL (1-2 hours)      Critically ill patients:  140 - 180 mg/dL   Lab Results  Component Value Date   GLUCAP 177 (H) 01/02/2023   HGBA1C 6.8 (H) 12/29/2022    Review of Glycemic Control  Diabetes history: DM2 Outpatient Diabetes medications: Lantus 25 at bedtime, Novolog 4 TID, Jardiance 25 daily, Ozempic 1.0 mg weekly (not taking any DM meds) Current orders for Inpatient glycemic control: Semglee 15 daily, Novolog 0-15 TID with meals and 0-5 HS  HgbA1C - 6.8%  Inpatient Diabetes Program Recommendations:    Consider increasing Semglee to 20 units daily if FBS > 180 mg/dL  Continue to follow.   Thank you. Ailene Ards, RD, LDN, CDCES Inpatient Diabetes Coordinator [phone]

## 2023-01-02 NOTE — TOC Transition Note (Addendum)
Transition of Care [facility]) - CM/SW Discharge Note   Patient Details  Name: Patrick [name] MRN: [id number] Date of Birth: 02/27/1965  [facility] CM/SW Contact:  Gordy Clement, RN Phone Number: 01/02/2023, 1:56 PM   Clinical Narrative:    Patient will DC to home later this evening after HD. Patient understands all of the OP HD arrangements and has transportation to HD appointments as well as transportation home today . AVS has been updated. Rollator has been delivered bedside by [facility] . No additional TOC needs          Barriers to Discharge: Continued Medical Work up   Patient Goals and CMS Choice      Discharge Placement                         Discharge Plan and Services Additional resources added to the After Visit Summary for     Discharge Planning Services: CM Consult                                 Social Determinants of Health (SDOH) Interventions SDOH Screenings   Food Insecurity: No Food Insecurity (12/29/2022)  Housing: Low Risk  (12/29/2022)  Transportation Needs: No Transportation Needs (12/29/2022)  Utilities: Not At Risk (12/29/2022)  Depression (PHQ2-9): Low Risk  (04/27/2022)  Social Connections: Unknown (06/30/2021)   Received from [facility]  Tobacco Use: Low Risk  (01/01/2023)     Readmission Risk Interventions    01/10/2021   11:02 AM 08/29/2020   12:23 PM  Readmission Risk Prevention Plan  Transportation Screening Complete Complete  PCP or Specialist Appt within 3-5 Days  Complete  HRI or Home Care Consult  Complete  Social Work Consult for Recovery Care Planning/Counseling  Complete  Palliative Care Screening  Not Applicable  Medication Review [employer]) Complete Complete  PCP or Specialist appointment within 3-5 days of discharge Complete   HRI or Home Care Consult Complete   SW Recovery Care/Counseling Consult Complete   Palliative Care Screening Not Applicable   Skilled Nursing Facility  Complete
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
## 2023-01-02 NOTE — Care Management Important Message (Signed)
Important Message  Patient Details  Name: Patrick [name] MRN: [id number] Date of Birth: 08-02-1965   Important Message Given:  Yes - Medicare IM     Dorena Bodo 01/02/2023, 3:08 PM

## 2023-01-03 ENCOUNTER — Other Ambulatory Visit (HOSPITAL_COMMUNITY): Payer: Self-pay

## 2023-01-03 DIAGNOSIS — N179 Acute kidney failure, unspecified: Secondary | ICD-10-CM | POA: Diagnosis not present

## 2023-01-03 DIAGNOSIS — E669 Obesity, unspecified: Secondary | ICD-10-CM | POA: Diagnosis not present

## 2023-01-03 DIAGNOSIS — Z89512 Acquired absence of left leg below knee: Secondary | ICD-10-CM | POA: Diagnosis not present

## 2023-01-03 DIAGNOSIS — E1165 Type 2 diabetes mellitus with hyperglycemia: Secondary | ICD-10-CM | POA: Diagnosis not present

## 2023-01-03 LAB — GLUCOSE, CAPILLARY
Glucose-Capillary: 172 mg/dL — ABNORMAL HIGH (ref 70–99)
Glucose-Capillary: 113 mg/dL — ABNORMAL HIGH (ref 70–99)

## 2023-01-03 MED ORDER — BASAGLAR KWIKPEN 100 UNIT/ML ~~LOC~~ SOPN
15.0000 [IU] | PEN_INJECTOR | Freq: Every day | SUBCUTANEOUS | 1 refills | Status: DC
  Filled 2023-01-03: qty 3, 20d supply, fill #0
  Filled 2023-01-03: qty 6, 40d supply, fill #0

## 2023-01-03 MED ORDER — CALCITRIOL 0.5 MCG PO CAPS
0.5000 ug | ORAL_CAPSULE | Freq: Every day | ORAL | 1 refills | Status: DC
  Filled 2023-01-03: qty 30, 30d supply, fill #0

## 2023-01-03 MED ORDER — METOPROLOL TARTRATE 25 MG PO TABS
25.0000 mg | ORAL_TABLET | Freq: Two times a day (BID) | ORAL | 3 refills | Status: DC
  Filled 2023-01-03: qty 60, 30d supply, fill #0

## 2023-01-03 MED ORDER — PREDNISONE 20 MG PO TABS
20.0000 mg | ORAL_TABLET | Freq: Every day | ORAL | 0 refills | Status: DC
Start: 2023-01-04 — End: 2023-01-03

## 2023-01-03 MED ORDER — PREDNISONE 20 MG PO TABS
20.0000 mg | ORAL_TABLET | Freq: Every day | ORAL | 0 refills | Status: AC
  Filled 2023-01-03: qty 3, 3d supply, fill #0

## 2023-01-03 MED ORDER — HEPARIN SODIUM (PORCINE) 1000 UNIT/ML IJ SOLN
INTRAMUSCULAR | Status: AC
  Filled 2023-01-03: qty 4

## 2023-01-03 MED ORDER — HYDRALAZINE HCL 25 MG PO TABS
25.0000 mg | ORAL_TABLET | Freq: Three times a day (TID) | ORAL | 1 refills | Status: DC
  Filled 2023-01-03: qty 90, 30d supply, fill #0

## 2023-01-03 MED ORDER — CALCIUM CARBONATE ANTACID 500 MG PO CHEW
1.0000 | CHEWABLE_TABLET | Freq: Three times a day (TID) | ORAL | 1 refills | Status: DC
  Filled 2023-01-03: qty 90, 30d supply, fill #0

## 2023-01-03 MED ORDER — BASAGLAR KWIKPEN 100 UNIT/ML ~~LOC~~ SOPN: 15.0000 [IU] | PEN_INJECTOR | Freq: Every day | SUBCUTANEOUS | 1 refills | Status: DC

## 2023-01-03 MED ORDER — INSULIN PEN NEEDLE 32G X 4 MM MISC
0 refills | Status: DC
  Filled 2023-01-03: qty 100, 25d supply, fill #0

## 2023-01-03 MED ORDER — SODIUM BICARBONATE 650 MG PO TABS
1300.0000 mg | ORAL_TABLET | Freq: Two times a day (BID) | ORAL | 1 refills | Status: DC
  Filled 2023-01-03: qty 60, 15d supply, fill #0

## 2023-01-03 MED ORDER — TORSEMIDE 100 MG PO TABS
100.0000 mg | ORAL_TABLET | Freq: Every day | ORAL | 1 refills | Status: DC
  Filled 2023-01-03: qty 30, 30d supply, fill #0

## 2023-01-03 MED ORDER — INSULIN ASPART 100 UNIT/ML FLEXPEN
4.0000 [IU] | PEN_INJECTOR | Freq: Three times a day (TID) | SUBCUTANEOUS | 1 refills | Status: DC
  Filled 2023-01-03: qty 3, 25d supply, fill #0

## 2023-01-03 MED ORDER — AMLODIPINE BESYLATE 10 MG PO TABS
10.0000 mg | ORAL_TABLET | Freq: Every day | ORAL | 2 refills | Status: DC
  Filled 2023-01-03: qty 90, 90d supply, fill #0

## 2023-01-03 MED ORDER — INSULIN ASPART FLEXPEN 100 UNIT/ML ~~LOC~~ SOPN: 4.0000 [IU] | PEN_INJECTOR | Freq: Three times a day (TID) | SUBCUTANEOUS | 1 refills | Status: DC

## 2023-01-03 NOTE — Progress Notes (Signed)
Nutrition Education Consult  RD received consult for renal diet education. Unable to speak with patient at this time; he did not answer when phone number called. RD attached "Nutrition for Dialysis" handout from the [employer] to discharge instructions. RD will be available at dialysis center for diet education as needed.   Gabriel Rainwater RD, LDN, CNSC Please refer to Amion for contact information.
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
## 2023-01-03 NOTE — Progress Notes (Signed)
New Dialysis Start    Patient identified as new dialysis start. Kidney Education packet assembled and given. Discussed the following items with patient:     Current medications and possible changes once started:  Discussed that patient's medications may change over time.  Ex; hypertension medications and diabetes medication.  Nephrologists will adjust as needed.   Fluid restrictions reviewed:  32 oz daily goal:  All liquids count; soups, ice, jello, fruits. Will also refer dietitian.   Phosphorus and potassium: Handout given showing high potassium and phosphorus foods.  Alternative food and drink options given. Will also refer dietitian.   Family support:  Friend at bedside   Outpatient Clinic Resources:  Discussed roles of Outpatient clinic staff and advised to make a list of needs, if any, to talk with outpatient staff if needed   Care plan schedule: Informed patient of Care Plans in outpatient setting and to participate in the care plan.  An invitation would be given from outpatient clinic.    Dialysis Access Options:  Reviewed access options with patients. Discussed in detail about care at home with new AVG & AVF. Reviewed checking bruit and thrill. If dialysis catheter present, educated that patient could not take showers.  Catheter dressing changes were to be done by outpatient clinic staff only   Home therapy options:  Educated patient about home therapy options:  PD vs home hemo.     Patient verbalized understanding of all materials discussed. Patient had a 32oz sweet tea and box of bojangles at bedside, discussed fluid restrictions and dietary recommendations.  Also discussed compliance of treatments for a better quality of life, and encouraged patient to attend follow up visits as well as appointments for AVF/G. Will continue to round on patient during admission.    Jean Rosenthal Dialysis Nurse Coordinator [phone]
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
## 2023-01-03 NOTE — Progress Notes (Signed)
D/C order noted. Contacted TCU RN to advise her of pt's planned d/c for today and that pt should start tomorrow as planned. Renal PA has sent orders to clinic. Arrangements added to AVS and met with pt yesterday to discuss details.  Olivia Canter Renal Navigator [phone]

## 2023-01-03 NOTE — Progress Notes (Signed)
Nephrology Follow-Up Consult note   Assessment/Recommendations: Patrick [name] is a/an 57 y.o. male with a past medical history significant for HTN, HLD, DM2, CKD 4, gout, bilateral BKA, CVA who present w/ shortness of breath, elevated troponin, and worsening CKD    ESRD: Underlying DKD. Progressive CKD. Renal US no obstruction. Creatinine was 4.7 in May and creatinine is now 7.5.  This rate of progression would be similar to what he has seen in the past.  Follows with [facility] but did not attend visit in the past year. I suspect he is ESRD now.  -I recommended he initiate dialysis during this admission -- he has many electrolyte abnormalities, eGFR 8 and early uremic symptoms.  I'm concerned if he delays initiation he may end up presenting in an emergency situation.  Additionally, he had not been attending outpt f/u as it is -consulted [facility] for [facility] and perm access; appreciate consult - 11/12 HD catheter placement here and perm access later outpt - pt aware.  -CLIP to outpt HD --> ready for d/c today, 1st outpt tx tomorrow TCU -Continue to monitor daily Cr, Dose meds for GFR -Monitor Daily I/Os, Daily weight  -Maintain MAP>65 for optimal renal perfusion.  -Avoid nephrotoxic medications including NSAIDs -Use synthetic opioids (Fentanyl/Dilaudid) if needed   Hypocalcemia: Likely related to secondary hyperparathyroidism with CKD.  PTH 350.  Improving on calcitriol and oral ca.   Anemia: Likely multifactorial.  Iron fairly depleted.  Receiving IV iron.  ESA with HD.   Shortness of breath: Likely some volume overload despite reassuring chest x-ray. Improved with diuresis. Changed to po diuretic.  UF with HD as well.  Will need to est EDW but he appears fairly euvolemic now.    NSTEMI: Cardiology feels it is demand.  Appreciate their help  Tyler Pita [facility] 01/03/2023 7:19 AM  ___________________________________________________________  CC: Chest tightness  Interval  History/Subjective: Tolerated HD#2 overnight - for d/c today.  UOP .  Pt no new issues this AM.  Ready for d/c.  Aware of HD tomorrow outpt.     Medications:  Current Facility-Administered Medications  Medication Dose Route Frequency Provider Last Rate Last Admin   acetaminophen (TYLENOL) tablet 650 mg  650 mg Oral Q6H PRN Synetta Fail, MD       Or   acetaminophen (TYLENOL) suppository 650 mg  650 mg Rectal Q6H PRN Synetta Fail, MD       albuterol (PROVENTIL) (2.5 MG/3ML) 0.083% nebulizer solution 2.5 mg  2.5 mg Nebulization Q4H PRN Synetta Fail, MD       amLODipine (NORVASC) tablet 5 mg  5 mg Oral Daily Synetta Fail, MD   5 mg at 01/02/23 1000   calcitRIOL (ROCALTROL) capsule 0.5 mcg  0.5 mcg Oral Daily Darnell Level, MD   0.5 mcg at 01/02/23 1000   calcium carbonate (TUMS - dosed in mg elemental calcium) chewable tablet 200 mg of elemental calcium  1 tablet Oral TID Tyler Pita, MD   200 mg of elemental calcium at 01/02/23 1000   Chlorhexidine Gluconate Cloth 2 % PADS 6 each  6 each Topical Q0600 Tyler Pita, MD   6 each at 01/03/23 0552   gabapentin (NEURONTIN) capsule 300 mg  300 mg Oral Daily PRN Synetta Fail, MD   300 mg at 12/29/22 2034   heparin injection 5,000 Units  5,000 Units Subcutaneous Q8H Synetta Fail, MD   5,000 Units at 01/03/23 [phone]  heparin sodium (porcine) 1000 UNIT/ML injection            hydrALAZINE (APRESOLINE) tablet 25 mg  25 mg Oral Q8H Khatri, Pardeep, MD   25 mg at 01/03/23 0554   influenza vac split trivalent PF (FLULAVAL) injection 0.5 mL  0.5 mL Intramuscular Tomorrow-1000 Synetta Fail, MD       insulin aspart (novoLOG) injection 0-15 Units  0-15 Units Subcutaneous TID WC Dolly Rias, MD   8 Units at 01/02/23 1712   insulin aspart (novoLOG) injection 0-5 Units  0-5 Units Subcutaneous QHS Dolly Rias, MD   4 Units at 01/02/23 2035   insulin glargine-yfgn (SEMGLEE) injection 15 Units   15 Units Subcutaneous Daily Synetta Fail, MD   15 Units at 01/02/23 1000   iron sucrose (VENOFER) 200 mg in sodium chloride 0.9 % 100 mL IVPB  200 mg Intravenous Q24H Darnell Level, MD   Stopped at 12/31/22 1318   metoprolol tartrate (LOPRESSOR) tablet 25 mg  25 mg Oral BID Synetta Fail, MD   25 mg at 01/02/23 2045   nitroGLYCERIN (NITROSTAT) SL tablet 0.4 mg  0.4 mg Sublingual Q5 min PRN Synetta Fail, MD   0.4 mg at 12/29/22 1739   oxyCODONE (Oxy IR/ROXICODONE) immediate release tablet 5 mg  5 mg Oral Q6H PRN Willeen Niece, MD   5 mg at 01/02/23 1958   polyethylene glycol (MIRALAX / GLYCOLAX) packet 17 g  17 g Oral Daily PRN Synetta Fail, MD       predniSONE (DELTASONE) tablet 20 mg  20 mg Oral BID WC Synetta Fail, MD   20 mg at 01/02/23 1618   simvastatin (ZOCOR) tablet 10 mg  10 mg Oral QHS Synetta Fail, MD   10 mg at 01/02/23 2046   sodium bicarbonate tablet 1,300 mg  1,300 mg Oral BID Darnell Level, MD   1,300 mg at 01/02/23 2044   sodium chloride flush (NS) 0.9 % injection 3 mL  3 mL Intravenous Q12H Synetta Fail, MD   3 mL at 01/02/23 2053   sodium zirconium cyclosilicate (LOKELMA) packet 10 g  10 g Oral Once Tyler Pita, MD       torsemide [facility]) tablet 100 mg  100 mg Oral Daily Tyler Pita, MD   100 mg at 01/02/23 1000   Facility-Administered Medications Ordered in Other Encounters  Medication Dose Route Frequency Provider Last Rate Last Admin   ketamine (KETALAR) bolus via infusion 68.05 mg  0.5 mg/kg Intravenous Once Nadara Mustard, MD          Review of Systems: 10 systems reviewed and negative except per interval history/subjective  Physical Exam: Vitals:   01/03/23 0502 01/03/23 0718  BP: (!) 163/91 (!) 153/96  Pulse: 80 81  Resp:  17  Temp: (!) 97.3 F (36.3 C) 98.2 F (36.8 C)  SpO2: 98% 99%   No intake/output data recorded.  Intake/Output Summary (Last 24 hours) at 01/03/2023 0719 Last data  filed at 01/03/2023 1610 Gross per 24 hour  Intake 240 ml  Output 550 ml  Net -310 ml   Constitutional: well-appearing, no acute distress ENMT: ears and nose without scars or lesions, MMM CV: normal rate, no edema Respiratory: on RA with normal WOB Gastrointestinal: soft, non-tender, no palpable masses or hernias Skin: no visible lesions or rashes Psych: alert, judgement/insight appropriate, appropriate mood and affect   Test Results I personally reviewed new and old clinical labs  and radiology tests Lab Results  Component Value Date   NA 135 01/02/2023   K 5.1 01/02/2023   CL 104 01/02/2023   CO2 19 (L) 01/02/2023   BUN 69 (H) 01/02/2023   CREATININE 6.52 (H) 01/02/2023   CALCIUM 6.3 (LL) 01/02/2023   ALBUMIN 2.8 (L) 01/01/2023   PHOS 5.4 (H) 01/02/2023    CBC Recent Labs  Lab 12/29/22 1516 12/30/22 0308 12/31/22 0636 01/01/23 0509 01/02/23 0510  WBC 14.2*   < > 17.6* 20.2* 16.1*  NEUTROABS 11.5*  --   --   --   --   HGB 8.5*   < > 7.9* 8.5* 8.1*  HCT 27.7*   < > 25.4* 27.0* 25.7*  MCV 89.9   < > 86.7 86.5 86.8  PLT 393   < > 426* 446* 419*   < > = values in this interval not displayed.

## 2023-01-03 NOTE — Progress Notes (Signed)
   01/03/23 0255  Vitals  Temp 98.6 F (37 C)  Temp Source Oral  BP 136/88  BP Location Right Arm  BP Method Automatic  Patient Position (if appropriate) Lying  Pulse Rate 77  Pulse Rate Source Monitor  ECG Heart Rate 77  Resp 14  Oxygen Therapy  SpO2 99 %  During Treatment Monitoring  Blood Flow Rate (mL/min) 0 mL/min  Arterial Pressure (mmHg) -0.4 mmHg  Venous Pressure (mmHg) -1.61 mmHg  TMP (mmHg) -49.09 mmHg  Ultrafiltration Rate (mL/min) 113 mL/min  Dialysate Flow Rate (mL/min) 299 ml/min  Dialysate Potassium Concentration 2  Dialysate Calcium Concentration 2.5  Duration of HD Treatment -hour(s) 2.5 hour(s)  Cumulative Fluid Removed (mL) per Treatment  0.02  HD Safety Checks Performed Yes  Intra-Hemodialysis Comments Tx completed  Post Treatment  Dialyzer Clearance Lightly streaked  Liters Processed 37.5  Fluid Removed (mL) 0 mL  Tolerated HD Treatment Yes  Post-Hemodialysis Comments pt stable  Hemodialysis Catheter Right Internal jugular Double lumen Permanent (Tunneled)  Placement Date/Time: 01/01/23 1026   Placed prior to admission: No  Serial / Lot #: [id number]  Expiration Date: 09/18/27  Time Out: Correct patient;Correct site;Correct procedure  Maximum sterile barrier precautions: Hand hygiene;Cap;Large sterile sh...  Site Condition No complications  Blue Lumen Status Heparin locked  Red Lumen Status Heparin locked  Catheter fill solution Heparin 1000 units/ml  Catheter fill volume (Arterial) 1.9 cc  Catheter fill volume (Venous) 1.9  Dressing Type Transparent  Dressing Status Clean, Dry, Intact  Interventions New dressing  Drainage Description None  Dressing Change Due 01/08/23  Post treatment catheter status Capped and Clamped   Pt tolerated treatment without complication. Hand of to Alena Bills RN

## 2023-01-03 NOTE — Discharge Planning (Signed)
[facility]  Initial Hemodialysis Orders  Dialysis center: [facility]  Patient's name: Patrick [name] DOB: 1965/06/07 AKI or ESRD: ESRD  Discharge diagnosis: New ESRD 2. Uncontrolled type 2 diabetes mellitus  Allergies: No Known Allergies  Date of First Dialysis: 01/01/23 Cause of renal disease: DM  Dialysis Prescription: Dialysis Frequency: 4 times per week while in TCU Tx duration: 3 hours BFR: 350 DFR: 700  (Can titrate to BFR 400 and DFR 800 at second treatment) EDW: 147.5kg  Dialyzer: 180NRe UF profile/Sodium modeling?: No Dialysis Bath: 2 K 2.5 Ca  Dialysis access: Access type: [facility] Date placed: 01/01/23  Surgeon: Dr. Sherral Hammers Needle gauge: N/A (Will need referral for AVF)  In Center Medications: Heparin Dose: None  Type: N/A VDRA: Calcitriol 1 q HD Venofer: 100mg  IV q HD x 9 doses Mircera: IV q 2 weeks  Next dose due: next HD Sensipar: none  Discharge labs: Hgb: 8.1 K+: 5.1  Ca: 6.3  Phos: 5.4 Alb: 2.8  Please draw routine labs. Additional labs needed: Advise patient he can stop home calcitriol once he starts HD Additional notes/follow-up: Call with any questions  Rogers Blocker, PA-C 01/03/2023, 9:18 AM  [facility] Pager: [phone]

## 2023-01-07 ENCOUNTER — Other Ambulatory Visit (HOSPITAL_COMMUNITY): Payer: Self-pay

## 2023-02-01 ENCOUNTER — Encounter (HOSPITAL_COMMUNITY): Payer: Self-pay

## 2023-02-01 ENCOUNTER — Emergency Department (HOSPITAL_COMMUNITY)
Admission: EM | Admit: 2023-02-01 | Discharge: 2023-02-02 | Disposition: A | Discharge status: Home / Self Care | Payer: No Typology Code available for payment source | Attending: Emergency Medicine | Admitting: Emergency Medicine

## 2023-02-01 ENCOUNTER — Other Ambulatory Visit: Payer: Self-pay

## 2023-02-01 ENCOUNTER — Emergency Department (HOSPITAL_COMMUNITY): Payer: No Typology Code available for payment source

## 2023-02-01 DIAGNOSIS — E1122 Type 2 diabetes mellitus with diabetic chronic kidney disease: Secondary | ICD-10-CM | POA: Insufficient documentation

## 2023-02-01 DIAGNOSIS — M79642 Pain in left hand: Secondary | ICD-10-CM | POA: Diagnosis present

## 2023-02-01 DIAGNOSIS — R0602 Shortness of breath: Secondary | ICD-10-CM | POA: Diagnosis not present

## 2023-02-01 DIAGNOSIS — Z6836 Body mass index (BMI) 36.0-36.9, adult: Secondary | ICD-10-CM | POA: Insufficient documentation

## 2023-02-01 DIAGNOSIS — Z1152 Encounter for screening for COVID-19: Secondary | ICD-10-CM | POA: Diagnosis not present

## 2023-02-01 DIAGNOSIS — Z79899 Other long term (current) drug therapy: Secondary | ICD-10-CM | POA: Insufficient documentation

## 2023-02-01 DIAGNOSIS — N183 Chronic kidney disease, stage 3 unspecified: Secondary | ICD-10-CM | POA: Insufficient documentation

## 2023-02-01 DIAGNOSIS — Z794 Long term (current) use of insulin: Secondary | ICD-10-CM | POA: Insufficient documentation

## 2023-02-01 DIAGNOSIS — Z992 Dependence on renal dialysis: Secondary | ICD-10-CM | POA: Insufficient documentation

## 2023-02-01 DIAGNOSIS — M109 Gout, unspecified: Secondary | ICD-10-CM

## 2023-02-01 DIAGNOSIS — I129 Hypertensive chronic kidney disease with stage 1 through stage 4 chronic kidney disease, or unspecified chronic kidney disease: Secondary | ICD-10-CM | POA: Insufficient documentation

## 2023-02-01 DIAGNOSIS — E669 Obesity, unspecified: Secondary | ICD-10-CM | POA: Diagnosis not present

## 2023-02-01 NOTE — ED Triage Notes (Signed)
Pt arrived from home via [facility] c/o Left hand, elbow, and shoulder hurting and Right hand, pt thinks may be having a gout flare up. Pt noted to be very edematous. Pt states that he just returned home from being out of town and missed dialysis today.

## 2023-02-01 NOTE — ED Provider Notes (Signed)
[place] EMERGENCY DEPARTMENT AT [facility] Provider Note   CSN: [id number] Arrival date & time: 02/01/23  2106     History  Chief Complaint  Patient presents with   Generalized Body Aches   Patrick [name] is a 57 y.o. male with history of type 2 diabetes, hypertension, bilateral BKA's, CVA, CKD stage III on dialysis for 2 weeks with right chest wall dialysis catheter, going Monday Wednesday Friday but has missed his last 2 sessions.  Also has history of gout which he states flares up all the time.  States that he was driving back from [place] yesterday and is pain began in his left hand elbow shoulders right hand, states this feels consistent with his gout flareups. Patient frequently uses NSAID medication secondary to gout, discharged on torsemide 1000 mg daily. No colchicine or allopurinol.   Patient was recently admitted for chest pain and shortness of breath.  Given AKI/ESRD was told to hold allopurinol and colchicine for gout at time of discharge.  HPI      Home Medications Prior to Admission medications   Medication Sig Start Date End Date Taking? Authorizing Provider  amLODipine (NORVASC) 10 MG tablet Take 1 tablet (10 mg total) by mouth daily. 01/03/23 04/03/23  Kathlen Mody, MD  calcitRIOL (ROCALTROL) 0.5 MCG capsule Take 1 capsule (0.5 mcg total) by mouth daily. 01/03/23   Kathlen Mody, MD  calcium carbonate (TUMS - DOSED IN MG ELEMENTAL CALCIUM) 500 MG chewable tablet Chew 1 tablet (200 mg of elemental calcium total) by mouth 3 (three) times daily. 01/03/23   Kathlen Mody, MD  hydrALAZINE (APRESOLINE) 25 MG tablet Take 1 tablet (25 mg total) by mouth every 8 (eight) hours. 01/03/23   Kathlen Mody, MD  insulin aspart (NOVOLOG) 100 UNIT/ML FlexPen Inject 4 Units into the skin 3 (three) times daily with meals. 01/03/23   Kathlen Mody, MD  Insulin Glargine (BASAGLAR KWIKPEN) 100 UNIT/ML Inject 15 Units into the skin at bedtime. 01/03/23   Kathlen Mody, MD   Insulin Pen Needle 32G X 4 MM MISC Use with insulin pens 01/03/23   Kathlen Mody, MD  metoprolol tartrate (LOPRESSOR) 25 MG tablet Take 1 tablet (25 mg total) by mouth 2 (two) times daily. 01/03/23   Kathlen Mody, MD  OZEMPIC, 1 MG/DOSE, 4 MG/3ML SOPN Inject 1 mg into the skin once a week. 01/01/22   [provider]  simvastatin (ZOCOR) 10 MG tablet Take 1 tablet (10 mg total) by mouth at bedtime. 01/02/23   Kathlen Mody, MD  sodium bicarbonate 650 MG tablet Take 2 tablets (1,300 mg total) by mouth 2 (two) times daily. 01/03/23   Kathlen Mody, MD  torsemide (DEMADEX) 100 MG tablet Take 1 tablet (100 mg total) by mouth daily. 01/03/23   Kathlen Mody, MD      Allergies    Patient has no known allergies.    Review of Systems   Review of Systems  Musculoskeletal:  Positive for arthralgias, joint swelling and myalgias.   Physical Exam Updated Vital Signs BP (!) 115/91   Pulse (!) 116   Temp 98.9 F (37.2 C) (Oral)   Resp 20   Ht 6\' 3"  (1.905 m)   Wt 133.8 kg   SpO2 97%   BMI 36.87 kg/m  Physical Exam Vitals and nursing note reviewed.  Constitutional:      Appearance: He is obese. He is not ill-appearing or toxic-appearing.  HENT:     Head: Normocephalic and atraumatic.  Mouth/Throat:     Mouth: Mucous membranes are moist.     Pharynx: No oropharyngeal exudate or posterior oropharyngeal erythema.  Eyes:     General:        Right eye: No discharge.        Left eye: No discharge.     Conjunctiva/sclera: Conjunctivae normal.     Pupils: Pupils are equal, round, and reactive to light.  Cardiovascular:     Rate and Rhythm: Normal rate and regular rhythm.     Pulses: Normal pulses.     Heart sounds: Murmur heard.  Pulmonary:     Effort: Pulmonary effort is normal. No respiratory distress.     Breath sounds: Normal breath sounds. No wheezing or rales.  Chest:    Abdominal:     General: Bowel sounds are normal. There is no distension.     Palpations: Abdomen  is soft.     Tenderness: There is no abdominal tenderness. There is no guarding or rebound.  Musculoskeletal:        General: No deformity.       Arms:     Cervical back: Neck supple.     Right lower leg: No edema.     Left lower leg: No edema.     Comments: Bilateral BKA  Skin:    General: Skin is warm and dry.     Capillary Refill: Capillary refill takes less than 2 seconds.  Neurological:     General: No focal deficit present.     Mental Status: He is alert and oriented to person, place, and time. Mental status is at baseline.     GCS: GCS eye subscore is 4. GCS verbal subscore is 5. GCS motor subscore is 6.     Sensory: Sensation is intact.     Motor: Motor function is intact.  Psychiatric:        Mood and Affect: Mood normal.    ED Results / Procedures / Treatments   Labs (all labs ordered are listed, but only abnormal results are displayed) Labs Reviewed  CBC WITH DIFFERENTIAL/PLATELET - Abnormal; Notable for the following components:      Result Value   WBC 11.9 (*)    RBC 3.29 (*)    Hemoglobin 9.2 (*)    HCT 30.1 (*)    Neutro Abs 8.5 (*)    Monocytes Absolute 1.5 (*)    All other components within normal limits  COMPREHENSIVE METABOLIC PANEL - Abnormal; Notable for the following components:   Glucose, Bld 125 (*)    BUN 55 (*)    Creatinine, Ser 6.32 (*)    Calcium 6.9 (*)    Albumin 3.1 (*)    Total Bilirubin 1.5 (*)    GFR, Estimated 10 (*)    All other components within normal limits  CK - Abnormal; Notable for the following components:   Total CK 517 (*)    All other components within normal limits  BRAIN NATRIURETIC PEPTIDE - Abnormal; Notable for the following components:   B Natriuretic Peptide 444.3 (*)    All other components within normal limits  TROPONIN I (HIGH SENSITIVITY) - Abnormal; Notable for the following components:   Troponin I (High Sensitivity) 106 (*)    All other components within normal limits  RESP PANEL BY RT-PCR (RSV, FLU  A&B, COVID)  RVPGX2  I-STAT CHEM 8, ED  I-STAT CG4 LACTIC ACID, ED  TROPONIN I (HIGH SENSITIVITY)    EKG EKG Interpretation Date/Time:  Saturday February 02 2023 02:50:36 EST Ventricular Rate:  117 PR Interval:  82 QRS Duration:  111 QT Interval:  350 QTC Calculation: 489 R Axis:   -2  Text Interpretation: Sinus tachycardia Incomplete RBBB and LAFB Abnormal R-wave progression, late transition Borderline prolonged QT interval Artifact in lead(s) I II aVR aVL aVF V1 and baseline wander in lead(s) V4 No significant change since last tracing Confirmed by Melene Plan [phone]) on 02/02/2023 5:58:51 AM  Radiology DG Hand 2 View Left Result Date: 02/02/2023 CLINICAL DATA:  Left hand swelling, possible gout. EXAM: LEFT HAND - 2 VIEW COMPARISON:  Left wrist three views 07/06/2022. FINDINGS: AP Lat views only.  Severe swelling is noted especially dorsally. Tiny marginal erosions are again noted at the index and long finger MCP joints and were seen previously as well as along the long finger DIP joint. There is mild narrowing and spurring of the thumb interphalangeal and MCP joints and first CMC joint, without erosive arthropathy. No soft tissue calcifications are noted apart from patchy calcification in the radial artery. There is no soft tissue gas visible. Other joints are radiographically unremarkable. Evidence of fractures is not seen. Normal bone mineralization. IMPRESSION: 1. Severe swelling especially dorsally. 2. Tiny marginal erosions at the index and long finger MCP joints and long finger DIP joint, which could be due to gout although other etiologies are certainly possible. No calcific deposits in the soft tissues are seen. 3. Mild garden variety degenerative changes of the thumb interphalangeal and MCP joints and first CMC joint. 4. Calcification in the radial artery. Electronically Signed   By: Almira Bar M.D.   On: 02/02/2023 05:59   DG Chest Port 1 View Result Date: 02/01/2023 CLINICAL  DATA:  Body aches and shortness of breath. EXAM: PORTABLE CHEST 1 VIEW COMPARISON:  Portable chest 01/01/2023 FINDINGS: The heart is moderately enlarged, unchanged. No vascular congestion is seen. The lungs are clear. Stable mediastinum with aortic tortuosity and ectasia, scattered calcification. Right IJ dialysis catheter again terminates about the superior cavoatrial junction. Thoracic spondylosis and asymmetric left shoulder DJD. No new osseous finding. IMPRESSION: 1. No evidence of acute chest disease. Stable chest with cardiomegaly. 2. Aortic atherosclerosis with chronic ectasia and uncoiling. Electronically Signed   By: Almira Bar M.D.   On: 02/01/2023 23:33    Procedures Procedures    Medications Ordered in ED Medications  morphine (PF) 4 MG/ML injection 4 mg (4 mg Intravenous Given 02/02/23 0207)  HYDROmorphone (DILAUDID) injection 0.5 mg (0.5 mg Intravenous Given 02/02/23 0424)  HYDROmorphone (DILAUDID) injection 0.5 mg (0.5 mg Intravenous Given 02/02/23 0507)    ED Course/ Medical Decision Making/ A&P Clinical Course as of 02/02/23 0703  Sat Feb 02, 2023  0057 DG Chest [place] 1 View [RS]  6045 Consult to Dr. Valentino Nose, nephrology who recommends follow up with dialysis on Monday and to emphasize importance of dialysis sessions. I appreciate his collaboration in the care of this patient.  [RS]    Clinical Course User Index [RS] Schae Cando, Eugene Gavia, PA-C                                 Medical Decision Making 57 year old male who presents with left upper extremity pain and swelling.  Recent prolonged driving from [place].  Mild hypertensive on intake and vitals otherwise normal.  Cardiopulmonary abdominal exams are unremarkable, bilateral BKA's, exquisite edema to the dorsum of the left hand without erythema,  induration or wound.  Normal vascular status in the left arm.  DDx includes not limited to inflammatory changes such as gout, pseudogout, cellulitis, DVT.  Amount and/or  Complexity of Data Reviewed Labs: ordered.    Details: CBC with leukocytosis of 11.9, anemia with hemoglobin of 9.2 both at patient's baseline.  CMP with creatinine of 6.3 at patient's new baseline, mildly elevated total bili 1.5.  From of 106 down from 400s.  CK mildly elevated to 517.  BNP 444, RVP negative, lactic negative.  Plain film of the hand with severe dorsal swelling gouty changes. Radiology: ordered. Decision-making details documented in ED Course.  Risk Prescription drug management.    Care of this patient signed out to oncoming ED provider Rose Phi, PA-C at time of shift change. All pertinent HPI, physical exam, and laboratory findings were discussed with them prior to my departure. Disposition of patient pending completion of workup, reevaluation, and clinical judgement of oncoming ED provider. Pending DVT study at time of shift change.   This chart was dictated using voice recognition software, Dragon. Despite the best efforts of this provider to proofread and correct errors, errors may still occur which can change documentation meaning.          Final Clinical Impression(s) / ED Diagnoses Final diagnoses:  None    Rx / DC Orders ED Discharge Orders     None         Sherrilee Gilles 02/02/23 0703    Wynetta Fines, MD 02/02/23 1245

## 2023-02-01 NOTE — ED Provider Notes (Incomplete)
[place] EMERGENCY DEPARTMENT AT [facility] Provider Note   CSN: [id number] Arrival date & time: 02/01/23  2106     History {Add pertinent medical, surgical, social history, OB history to HPI:1} Chief Complaint  Patient presents with  . Generalized Body Aches    Patrick [name] is a 57 y.o. male.  HPI     Home Medications Prior to Admission medications   Medication Sig Start Date End Date Taking? Authorizing Provider  amLODipine (NORVASC) 10 MG tablet Take 1 tablet (10 mg total) by mouth daily. 01/03/23 04/03/23  Kathlen Mody, MD  calcitRIOL (ROCALTROL) 0.5 MCG capsule Take 1 capsule (0.5 mcg total) by mouth daily. 01/03/23   Kathlen Mody, MD  calcium carbonate (TUMS - DOSED IN MG ELEMENTAL CALCIUM) 500 MG chewable tablet Chew 1 tablet (200 mg of elemental calcium total) by mouth 3 (three) times daily. 01/03/23   Kathlen Mody, MD  hydrALAZINE (APRESOLINE) 25 MG tablet Take 1 tablet (25 mg total) by mouth every 8 (eight) hours. 01/03/23   Kathlen Mody, MD  insulin aspart (NOVOLOG) 100 UNIT/ML FlexPen Inject 4 Units into the skin 3 (three) times daily with meals. 01/03/23   Kathlen Mody, MD  Insulin Glargine (BASAGLAR KWIKPEN) 100 UNIT/ML Inject 15 Units into the skin at bedtime. 01/03/23   Kathlen Mody, MD  Insulin Pen Needle 32G X 4 MM MISC Use with insulin pens 01/03/23   Kathlen Mody, MD  metoprolol tartrate (LOPRESSOR) 25 MG tablet Take 1 tablet (25 mg total) by mouth 2 (two) times daily. 01/03/23   Kathlen Mody, MD  OZEMPIC, 1 MG/DOSE, 4 MG/3ML SOPN Inject 1 mg into the skin once a week. 01/01/22   [provider]  simvastatin (ZOCOR) 10 MG tablet Take 1 tablet (10 mg total) by mouth at bedtime. 01/02/23   Kathlen Mody, MD  sodium bicarbonate 650 MG tablet Take 2 tablets (1,300 mg total) by mouth 2 (two) times daily. 01/03/23   Kathlen Mody, MD  torsemide (DEMADEX) 100 MG tablet Take 1 tablet (100 mg total) by mouth daily. 01/03/23   Kathlen Mody,  MD      Allergies    Patient has no known allergies.    Review of Systems   Review of Systems  Physical Exam Updated Vital Signs BP (!) 140/97 (BP Location: Right Arm)   Pulse 100   Temp 98.6 F (37 C) (Oral)   Resp 18   Ht 6\' 3"  (1.905 m)   Wt 133.8 kg   SpO2 99%   BMI 36.87 kg/m  Physical Exam  ED Results / Procedures / Treatments   Labs (all labs ordered are listed, but only abnormal results are displayed) Labs Reviewed  CBC WITH DIFFERENTIAL/PLATELET  COMPREHENSIVE METABOLIC PANEL  I-STAT CHEM 8, ED    EKG None  Radiology No results found.  Procedures Procedures  {Document cardiac monitor, telemetry assessment procedure when appropriate:1}  Medications Ordered in ED Medications - No data to display  ED Course/ Medical Decision Making/ A&P   {   Click here for ABCD2, HEART and other calculatorsREFRESH Note before signing :1}                              Medical Decision Making Amount and/or Complexity of Data Reviewed Labs: ordered. Radiology: ordered.   ***  {Document critical care time when appropriate:1} {Document review of labs and clinical decision tools ie heart score, Chads2Vasc2 etc:1}  {Document  your independent review of radiology images, and any outside records:1} {Document your discussion with family members, caretakers, and with consultants:1} {Document social determinants of health affecting pt's care:1} {Document your decision making why or why not admission, treatments were needed:1} Final Clinical Impression(s) / ED Diagnoses Final diagnoses:  None    Rx / DC Orders ED Discharge Orders     None

## 2023-02-02 ENCOUNTER — Emergency Department (HOSPITAL_BASED_OUTPATIENT_CLINIC_OR_DEPARTMENT_OTHER): Payer: Medicare Other

## 2023-02-02 ENCOUNTER — Emergency Department (HOSPITAL_COMMUNITY): Payer: Medicare Other

## 2023-02-02 DIAGNOSIS — M7989 Other specified soft tissue disorders: Secondary | ICD-10-CM | POA: Diagnosis not present

## 2023-02-02 DIAGNOSIS — M79642 Pain in left hand: Secondary | ICD-10-CM | POA: Diagnosis not present

## 2023-02-02 LAB — CBC WITH DIFFERENTIAL/PLATELET
Abs Immature Granulocytes: 0.06 10*3/uL (ref 0.00–0.07)
Basophils Absolute: 0.1 10*3/uL (ref 0.0–0.1)
Basophils Relative: 1 %
Eosinophils Absolute: 0 10*3/uL (ref 0.0–0.5)
Eosinophils Relative: 0 %
HCT: 30.1 % — ABNORMAL LOW (ref 39.0–52.0)
Hemoglobin: 9.2 g/dL — ABNORMAL LOW (ref 13.0–17.0)
Immature Granulocytes: 1 %
Lymphocytes Relative: 14 %
Lymphs Abs: 1.7 10*3/uL (ref 0.7–4.0)
MCH: 28 pg (ref 26.0–34.0)
MCHC: 30.6 g/dL (ref 30.0–36.0)
MCV: 91.5 fL (ref 80.0–100.0)
Monocytes Absolute: 1.5 10*3/uL — ABNORMAL HIGH (ref 0.1–1.0)
Monocytes Relative: 13 %
Neutro Abs: 8.5 10*3/uL — ABNORMAL HIGH (ref 1.7–7.7)
Neutrophils Relative %: 71 %
Platelets: 277 10*3/uL (ref 150–400)
RBC: 3.29 MIL/uL — ABNORMAL LOW (ref 4.22–5.81)
RDW: 14.6 % (ref 11.5–15.5)
WBC: 11.9 10*3/uL — ABNORMAL HIGH (ref 4.0–10.5)
nRBC: 0 % (ref 0.0–0.2)

## 2023-02-02 LAB — I-STAT CG4 LACTIC ACID, ED: Lactic Acid, Venous: 1.2 mmol/L (ref 0.5–1.9)

## 2023-02-02 LAB — RESP PANEL BY RT-PCR (RSV, FLU A&B, COVID)  RVPGX2
Influenza A by PCR: NEGATIVE
Influenza B by PCR: NEGATIVE
Resp Syncytial Virus by PCR: NEGATIVE
SARS Coronavirus 2 by RT PCR: NEGATIVE

## 2023-02-02 LAB — COMPREHENSIVE METABOLIC PANEL
ALT: 11 U/L (ref 0–44)
AST: 15 U/L (ref 15–41)
Albumin: 3.1 g/dL — ABNORMAL LOW (ref 3.5–5.0)
Alkaline Phosphatase: 65 U/L (ref 38–126)
Anion gap: 14 (ref 5–15)
BUN: 55 mg/dL — ABNORMAL HIGH (ref 6–20)
CO2: 23 mmol/L (ref 22–32)
Calcium: 6.9 mg/dL — ABNORMAL LOW (ref 8.9–10.3)
Chloride: 100 mmol/L (ref 98–111)
Creatinine, Ser: 6.32 mg/dL — ABNORMAL HIGH (ref 0.61–1.24)
GFR, Estimated: 10 mL/min — ABNORMAL LOW (ref >60)
Glucose, Bld: 125 mg/dL — ABNORMAL HIGH (ref 70–99)
Potassium: 3.8 mmol/L (ref 3.5–5.1)
Sodium: 137 mmol/L (ref 135–145)
Total Bilirubin: 1.5 mg/dL — ABNORMAL HIGH (ref <1.2)
Total Protein: 6.6 g/dL (ref 6.5–8.1)

## 2023-02-02 LAB — BRAIN NATRIURETIC PEPTIDE: B Natriuretic Peptide: 444.3 pg/mL — ABNORMAL HIGH (ref 0.0–100.0)

## 2023-02-02 LAB — CK: Total CK: 517 U/L — ABNORMAL HIGH (ref 49–397)

## 2023-02-02 LAB — TROPONIN I (HIGH SENSITIVITY)
Troponin I (High Sensitivity): 106 ng/L (ref <18)
Troponin I (High Sensitivity): 102 ng/L (ref <18)

## 2023-02-02 MED ORDER — OXYCODONE-ACETAMINOPHEN 5-325 MG PO TABS: 1.0000 | ORAL_TABLET | Freq: Three times a day (TID) | ORAL | 0 refills | Status: AC | PRN

## 2023-02-02 MED ORDER — HYDROMORPHONE HCL 1 MG/ML IJ SOLN
0.5000 mg | Freq: Once | INTRAMUSCULAR | Status: AC
  Administered 2023-02-02: 0.5 mg via INTRAVENOUS
  Filled 2023-02-02: qty 1

## 2023-02-02 MED ORDER — PREDNISONE 5 MG PO TABS
50.0000 mg | ORAL_TABLET | Freq: Once | ORAL | Status: AC
  Administered 2023-02-02: 50 mg via ORAL
  Filled 2023-02-02: qty 2

## 2023-02-02 MED ORDER — PREDNISONE 20 MG PO TABS: ORAL_TABLET | ORAL | 0 refills | Status: AC

## 2023-02-02 MED ORDER — MORPHINE SULFATE (PF) 4 MG/ML IV SOLN
4.0000 mg | Freq: Once | INTRAVENOUS | Status: AC
  Administered 2023-02-02: 4 mg via INTRAVENOUS
  Filled 2023-02-02: qty 1

## 2023-02-02 NOTE — ED Notes (Signed)
Patient transported to vascular. 

## 2023-02-02 NOTE — Progress Notes (Signed)
VASCULAR LAB    Left upper extremity venous duplex has been performed.  See CV proc for preliminary results.  Messaged negative results to Dr. Wilkie Aye and Valrie Hart, PA-C via secure chat  Sherren Kerns, RVT 02/02/2023, 8:26 AM

## 2023-02-02 NOTE — ED Notes (Signed)
Lab did not have troponin that was mark as collected at 511am, another tube sent at this time

## 2023-02-02 NOTE — ED Provider Notes (Signed)
   Accepted handoff at shift change from [facility]. Please see prior provider note for more detail.   Briefly: Patient is 57 y.o. "Also has history of gout which he states flares up all the time.  States that he was driving back from [place] yesterday and is pain began in his left hand elbow shoulders right hand, states this feels consistent with his flareups. Patient frequently use NSAID medication secondary to gout, discharged on torsemide 1000 mg daily Patient was recently admitted for chest pain and shortness of breath.  Given AKI/ESRD was told to hold allopurinol and colchicine for gout at time of discharge."  DDX: concern for gout, pseudogout, cellulitis, DVT   Plan:  -dispo pending possible dialysis and DVT study.  - Consult to Dr. Valentino Nose, nephrology who recommends follow up with dialysis on Monday and to emphasize importance of dialysis sessions. I appreciate his collaboration in the care of this patient  - [place] without concern for DVT. Repeat troponin down trending. - it appears that patient's joint pain is d/t gout. Shared imaging and lab results with patient and answered questions. Patient stating that he usually takes oral steroids for these flares. Patient stating it has been at least 2.5 weeks since he took steroids last. Patient stating that he has been on allopurinol in the past for gout prophylaxis which did not help. Patient following closely with PCP for gout. Will provide patient with prescription for steroid taper and recommend following up with PCP. Patient verbalized understanding of plan. -Patient afebrile with stable vitals. Provided with return precautions. Discharged in good condition.   Dorthy Cooler, [place] 02/02/23 1029    Melene Plan, DO 02/02/23 2257

## 2023-02-02 NOTE — Discharge Instructions (Addendum)
It was a pleasure caring for you today. As discussed, please follow up with your primary care provider. Seek emergency care if experiencing any new or worsening symptoms.

## 2023-02-11 NOTE — Progress Notes (Deleted)
[facility] [place]  ASSESSMENT / PLAN: Patrick [name] is a 57 y.o. *** handed male in need of permanent dialysis access. I reviewed options for dialysis in detail with the patient, including hemodialysis and peritoneal dialysis. I counseled the patient to ask their nephrologist about their candidacy for renal transplant. I counseled the patient that dialysis access requires surveillance and periodic maintenance. Plan to proceed with ***.    CHIEF COMPLAINT: ***  HISTORY OF PRESENT ILLNESS: Patrick [name] is a 57 y.o. male ***  VASCULAR SURGICAL HISTORY: ***  VASCULAR RISK FACTORS: {FINDINGS; POSITIVE NEGATIVE:([phone]} history of stroke / transient ischemic attack. {FINDINGS; POSITIVE NEGATIVE:([phone] [phone]} history of coronary artery disease. *** history of PCI. *** history of CABG.  {FINDINGS; POSITIVE NEGATIVE:([phone]} history of diabetes mellitus. Last A1c ***. {FINDINGS; POSITIVE NEGATIVE:([phone] [phone]} history of smoking. *** actively smoking. {FINDINGS; POSITIVE NEGATIVE:([phone] [phone]} history of hypertension. *** drug regimen with *** control. {FINDINGS; POSITIVE NEGATIVE:([phone] [phone]} history of chronic kidney disease.  Last GFR ***. CKD {stage:30421363}. {FINDINGS; POSITIVE NEGATIVE:([phone] 584-3352} history of chronic obstructive pulmonary disease, treated with ***.  FUNCTIONAL STATUS: ECOG performance status: {findings; ecog performance status:31780} Ambulatory status: {TNHAmbulation:25868}  CAREY 1 AND 3 YEAR INDEX Male (2pts) 75-79 or 80-84 (2pts) >84 (3pts) Dependence in toileting (1pt) Partial or full dependence in dressing (1pt) History of malignant neoplasm (2pts) CHF (3pts) COPD (1pts) CKD (3pts)  0-3 pts 6% 1 year mortality ; 21% 3 year mortality 4-5 pts 12% 1 year mortality ; 36% 3 year mortality >5 pts 21% 1 year mortality; 54% 3 year mortality   Past Medical History:  Diagnosis Date   ABLA (acute blood loss anemia) 09/01/2020   Acute  respiratory disease due to COVID-19 virus 05/03/2019   Anemia    Cholelithiasis with acute cholecystitis 09/25/2020   Diabetes mellitus    Diabetic foot infection (HCC) 06/04/2020   Diabetic foot ulcer (HCC) 06/04/2020   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   Gout    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/20/2020   History of COVID-19 04/2019   Hypertension    Hypertensive urgency 09/25/2020   Hypoglycemia 01/03/2022   Osteomyelitis of right foot (HCC)    PVD (peripheral vascular disease) (HCC)    s/p R BKA   Right arm cellulitis 01/09/2022   Sepsis due to cellulitis (HCC) 06/04/2020   Subacute osteomyelitis, left ankle and foot [facility])     Past Surgical History:  Procedure Laterality Date   AMPUTATION Right 06/06/2020   Procedure: AMPUTATION 5TH RAY;  Surgeon: Nadara Mustard, MD;  Location: [facility] OR;  Service: Orthopedics;  Laterality: Right;   AMPUTATION Right 06/08/2020   Procedure: RIGHT 4TH RAY AMPUTATION;  Surgeon: Nadara Mustard, MD;  Location: [facility] OR;  Service: Orthopedics;  Laterality: Right;   AMPUTATION Right 08/27/2020   Procedure: AMPUTATION BELOW KNEE;  Surgeon: Kerrin Champagne, MD;  Location: [facility] OR;  Service: Orthopedics;  Laterality: Right;   AMPUTATION Left 12/30/2020   Procedure: LEFT BELOW KNEE AMPUTATION;  Surgeon: Nadara Mustard, MD;  Location: [facility] OR;  Service: Orthopedics;  Laterality: Left;   CHOLECYSTECTOMY N/A 09/25/2020   Procedure: LAPAROSCOPIC CHOLECYSTECTOMY;  Surgeon: Diamantina Monks, MD;  Location: [facility] OR;  Service: General;  Laterality: N/A;   INSERTION OF DIALYSIS CATHETER N/A 01/01/2023   Procedure: INSERTION OF TUNNELED  DIALYSIS CATHETER RIGHT INTERNAL JUGULAR VEIN;  Surgeon: Victorino Sparrow, MD;  Location: [facility] OR;  Service: Vascular;  Laterality: N/A;  Family History  Problem Relation Age of Onset   Diabetes Neg Hx     Social History   Socioeconomic History   Marital status: Divorced    Spouse name: Not on file    Number of children: Not on file   Years of education: Not on file   Highest education level: Not on file  Occupational History   Occupation: logistics  Tobacco Use   Smoking status: Never    Passive exposure: Never   Smokeless tobacco: Never  Vaping Use   Vaping status: Never Used  Substance and Sexual Activity   Alcohol use: No   Drug use: No   Sexual activity: Not Currently  Other Topics Concern   Not on file  Social History Narrative   Not on file   Social Drivers of Health   Financial Resource Strain: Not on file  Food Insecurity: No Food Insecurity (12/29/2022)   Hunger Vital Sign    Worried About Running Out of Food in the Last Year: Never true    Ran Out of Food in the Last Year: Never true  Transportation Needs: No Transportation Needs (12/29/2022)   PRAPARE - Administrator, Civil Service (Medical): No    Lack of Transportation (Non-Medical): No  Physical Activity: Not on file  Stress: Not on file  Social Connections: Unknown (06/30/2021)   Received from [facility]   Social Network    Social Network: Not on file  Intimate Partner Violence: Not At Risk (12/29/2022)   Humiliation, Afraid, Rape, and Kick questionnaire    Fear of Current or Ex-Partner: No    Emotionally Abused: No    Physically Abused: No    Sexually Abused: No    No Known Allergies  Current Outpatient Medications  Medication Sig Dispense Refill   amLODipine (NORVASC) 10 MG tablet Take 1 tablet (10 mg total) by mouth daily. 90 tablet 2   calcitRIOL (ROCALTROL) 0.5 MCG capsule Take 1 capsule (0.5 mcg total) by mouth daily. 30 capsule 1   calcium carbonate (TUMS - DOSED IN MG ELEMENTAL CALCIUM) 500 MG chewable tablet Chew 1 tablet (200 mg of elemental calcium total) by mouth 3 (three) times daily. 90 tablet 1   hydrALAZINE (APRESOLINE) 25 MG tablet Take 1 tablet (25 mg total) by mouth every 8 (eight) hours. 90 tablet 1   insulin aspart (NOVOLOG) 100 UNIT/ML FlexPen  Inject 4 Units into the skin 3 (three) times daily with meals. 15 mL 1   Insulin Glargine (BASAGLAR KWIKPEN) 100 UNIT/ML Inject 15 Units into the skin at bedtime. 15 mL 1   Insulin Pen Needle 32G X 4 MM MISC Use with insulin pens 100 each 0   metoprolol tartrate (LOPRESSOR) 25 MG tablet Take 1 tablet (25 mg total) by mouth 2 (two) times daily. 60 tablet 3   OZEMPIC, 1 MG/DOSE, 4 MG/3ML SOPN Inject 1 mg into the skin once a week.     simvastatin (ZOCOR) 10 MG tablet Take 1 tablet (10 mg total) by mouth at bedtime. 90 tablet 3   sodium bicarbonate 650 MG tablet Take 2 tablets (1,300 mg total) by mouth 2 (two) times daily. 60 tablet 1   torsemide (DEMADEX) 100 MG tablet Take 1 tablet (100 mg total) by mouth daily. 30 tablet 1   No current facility-administered medications for this visit.   Facility-Administered Medications Ordered in Other Visits  Medication Dose Route Frequency Provider Last Rate Last Admin   ketamine (KETALAR) bolus via  infusion 68.05 mg  0.5 mg/kg Intravenous Once Nadara Mustard, MD        PHYSICAL EXAM There were no vitals filed for this visit.  Constitutional: *** appearing. *** distress. Appears *** nourished.  Neurologic: CN ***. *** focal findings. *** sensory loss. Psychiatric: *** Mood and affect symmetric and appropriate. Eyes: *** No icterus. No conjunctival pallor. Ears, nose, throat: *** mucous membranes moist. Midline trachea.  Cardiac: *** rate and rhythm.  Respiratory: *** unlabored. Abdominal: *** soft, non-tender, non-distended.  Peripheral vascular: *** Extremity: *** edema. *** cyanosis. *** pallor.  Skin: *** gangrene. *** ulceration.  Lymphatic: *** Stemmer's sign. *** palpable lymphadenopathy.    PERTINENT LABORATORY AND RADIOLOGIC DATA  Most recent CBC    Latest Ref Rng & Units 02/01/2023   11:47 PM 01/02/2023    5:10 AM 01/01/2023    5:09 AM  CBC  WBC 4.0 - 10.5 K/uL 11.9  16.1  20.2   Hemoglobin 13.0 - 17.0 g/dL 9.2  8.1  8.5    Hematocrit 39.0 - 52.0 % 30.1  25.7  27.0   Platelets 150 - 400 K/uL 277  419  446      Most recent CMP    Latest Ref Rng & Units 02/01/2023   11:47 PM 01/02/2023    5:10 AM 01/01/2023    5:12 AM  CMP  Glucose 70 - 99 mg/dL 409  811  914   BUN 6 - 20 mg/dL 55  69  76   Creatinine 0.61 - 1.24 mg/dL 7.82  9.56  2.13   Sodium 135 - 145 mmol/L 137  135  136   Potassium 3.5 - 5.1 mmol/L 3.8  5.1  5.7   Chloride 98 - 111 mmol/L 100  104  104   CO2 22 - 32 mmol/L 23  19  18    Calcium 8.9 - 10.3 mg/dL 6.9  6.3  6.5   Total Protein 6.5 - 8.1 g/dL 6.6     Total Bilirubin <1.2 mg/dL 1.5     Alkaline Phos 38 - 126 U/L 65     AST 15 - 41 U/L 15     ALT 0 - 44 U/L 11       Renal function Estimated Creatinine Clearance: 19.2 mL/min (A) (by C-G formula based on SCr of 6.32 mg/dL (H)).  Hgb A1c MFr Bld (%)  Date Value  12/29/2022 6.8 (H)    LDL Cholesterol (Calc)  Date Value Ref Range Status  06/01/2021 115 (H) mg/dL (calc) Final    Comment:    Reference range: <100 . Desirable range <100 mg/dL for primary prevention;   <70 mg/dL for patients with CHD or diabetic patients  with > or = 2 CHD risk factors. Marland Kitchen LDL-C is now calculated using the Martin-Hopkins  calculation, which is a validated novel method providing  better accuracy than the Friedewald equation in the  estimation of LDL-C.  Horald Pollen et al. Lenox Ahr. 0865;784(69): 2061-2068  (http://education.[employer].com/faq/FAQ164)      Vascular Imaging: ***  Rande Brunt. Lenell Antu, MD FACS [facility] Phone Number: [phone] 02/11/2023 9:14 PM   Total time spent on preparing this encounter including chart review, data review, collecting history, examining the patient, coordinating care for this {tnhtimebilling:26202}  Portions of this report may have been transcribed using voice recognition software.  Every effort has been made to ensure accuracy; however, inadvertent  computerized transcription errors may still be present.

## 2023-02-12 ENCOUNTER — Encounter: Payer: No Typology Code available for payment source | Admitting: Vascular Surgery

## 2023-03-11 NOTE — Progress Notes (Unsigned)
Patient ID: Patrick [name], male   DOB: 1965-10-17, 58 y.o.   MRN: [id number]  Reason for Consult: No chief complaint on file.   Referred by Anthony Sar, MD  Subjective:     HPI  Patrick [name] is a 58 y.o. male who presents for evaluation HD access creation.  Past Medical History: 09/01/2020: ABLA (acute blood loss anemia) 05/03/2019: Acute respiratory disease due to COVID-19 virus No date: Anemia 09/25/2020: Cholelithiasis with acute cholecystitis No date: Diabetes mellitus 06/04/2020: Diabetic foot infection (HCC) 06/04/2020: Diabetic foot ulcer (HCC) 06/04/2020: 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HCC) No date: Gout 07/20/2020: History of complete ray amputation of fifth toe of right  foot (HCC) 04/2019: History of COVID-19 No date: Hypertension 09/25/2020: Hypertensive urgency 01/03/2022: Hypoglycemia No date: Osteomyelitis of right foot (HCC) No date: PVD (peripheral vascular disease) (HCC)     Comment:  s/p R BKA 01/09/2022: Right arm cellulitis 06/04/2020: Sepsis due to cellulitis (HCC) No date: Subacute osteomyelitis, left ankle and foot (HCC)  Family History  Problem Relation Age of Onset   Diabetes Neg Hx    Past Surgical History:  Procedure Laterality Date   AMPUTATION Right 06/06/2020   Procedure: AMPUTATION 5TH RAY;  Surgeon: Nadara Mustard, MD;  Location: [facility] OR;  Service: Orthopedics;  Laterality: Right;   AMPUTATION Right 06/08/2020   Procedure: RIGHT 4TH RAY AMPUTATION;  Surgeon: Nadara Mustard, MD;  Location: [facility] OR;  Service: Orthopedics;  Laterality: Right;   AMPUTATION Right 08/27/2020   Procedure: AMPUTATION BELOW KNEE;  Surgeon: Kerrin Champagne, MD;  Location: [facility] OR;  Service: Orthopedics;  Laterality: Right;   AMPUTATION Left 12/30/2020   Procedure: LEFT BELOW KNEE AMPUTATION;  Surgeon: Nadara Mustard, MD;  Location: [facility] OR;  Service: Orthopedics;  Laterality: Left;   CHOLECYSTECTOMY N/A 09/25/2020   Procedure: LAPAROSCOPIC CHOLECYSTECTOMY;  Surgeon: Diamantina Monks, MD;  Location: [facility] OR;  Service: General;  Laterality: N/A;   INSERTION OF DIALYSIS CATHETER N/A 01/01/2023   Procedure: INSERTION OF TUNNELED  DIALYSIS CATHETER RIGHT INTERNAL JUGULAR VEIN;  Surgeon: Victorino Sparrow, MD;  Location: [facility] OR;  Service: Vascular;  Laterality: N/A;    Short Social History:  Social History   Tobacco Use   Smoking status: Never    Passive exposure: Never   Smokeless tobacco: Never  Substance Use Topics   Alcohol use: No    No Known Allergies  Current Outpatient Medications  Medication Sig Dispense Refill   amLODipine (NORVASC) 10 MG tablet Take 1 tablet (10 mg total) by mouth daily. 90 tablet 2   calcitRIOL (ROCALTROL) 0.5 MCG capsule Take 1 capsule (0.5 mcg total) by mouth daily. 30 capsule 1   calcium carbonate (TUMS - DOSED IN MG ELEMENTAL CALCIUM) 500 MG chewable tablet Chew 1 tablet (200 mg of elemental calcium total) by mouth 3 (three) times daily. 90 tablet 1   hydrALAZINE (APRESOLINE) 25 MG tablet Take 1 tablet (25 mg total) by mouth every 8 (eight) hours. 90 tablet 1   insulin aspart (NOVOLOG) 100 UNIT/ML FlexPen Inject 4 Units into the skin 3 (three) times daily with meals. 15 mL 1   Insulin Glargine (BASAGLAR KWIKPEN) 100 UNIT/ML Inject 15 Units into the skin at bedtime. 15 mL 1   Insulin Pen Needle 32G X 4 MM MISC Use with insulin pens 100 each 0   metoprolol tartrate (LOPRESSOR) 25 MG tablet Take 1 tablet (25 mg total) by mouth 2 (two) times daily. 60 tablet 3  OZEMPIC, 1 MG/DOSE, 4 MG/3ML SOPN Inject 1 mg into the skin once a week.     simvastatin (ZOCOR) 10 MG tablet Take 1 tablet (10 mg total) by mouth at bedtime. 90 tablet 3   sodium bicarbonate 650 MG tablet Take 2 tablets (1,300 mg total) by mouth 2 (two) times daily. 60 tablet 1   torsemide (DEMADEX) 100 MG tablet Take 1 tablet (100 mg total) by mouth daily. 30 tablet 1   No current facility-administered medications for this visit.   Facility-Administered Medications Ordered in  Other Visits  Medication Dose Route Frequency Provider Last Rate Last Admin   ketamine (KETALAR) bolus via infusion 68.05 mg  0.5 mg/kg Intravenous Once Nadara Mustard, MD        REVIEW OF SYSTEMS  Positive for ***  All other systems were reviewed and are negative     Objective:  Objective   There were no vitals filed for this visit. There is no height or weight on file to calculate BMI.  Physical Exam General: no acute distress Cardiac: hemodynamically stable Pulm: normal work of breathing Neuro: alert, no focal deficit Extremities: *** Vascular:   Right: palpable brachial, radial  Left: palpable brachial, radial   Data: UE arterial duplex Right Pre-Dialysis Findings:  +----------------------------+----------+-------------------+---------+----  ----+  Location                   PSV (cm/s)Intralum. Diam.    Waveform  Comments                                       (cm)                                   +----------------------------+----------+-------------------+---------+----  ----+  Radial artery distal upper  43        0.3                no plaque           arm                                                                          +----------------------------+----------+-------------------+---------+----  ----+  Ulnar artery distal upper   37        0.3                no plaque           arm                                                                          +----------------------------+----------+-------------------+---------+----  ----+  Brachial Antecub. fossa     59        0.60               triphasic           +----------------------------+----------+-------------------+---------+----  ----+  Radial Art at Wrist         26        0.30               triphasic           +----------------------------+----------+-------------------+---------+----  ----+  Ulnar Art at Wrist          40        0.24                triphasic           +----------------------------+----------+-------------------+---------+----  ----+    Left Pre-Dialysis Findings:  +----------------------------+----------+-------------------+---------+----  ----+  Location                   PSV (cm/s)Intralum. Diam.    Waveform  Comments                                       (cm)                                   +----------------------------+----------+-------------------+---------+----  ----+  Radial artery distal upper  40        0.3                no plaque           arm                                                                          +----------------------------+----------+-------------------+---------+----  ----+  Ulnar artery distal upper   43        0.4                no plaque           arm                                                                          +----------------------------+----------+-------------------+---------+----  ----+  Brachial Antecub. fossa     60        0.59               triphasic           +----------------------------+----------+-------------------+---------+----  ----+  Radial Art at Wrist         40        0.23               triphasic           +----------------------------+----------+-------------------+---------+----  ----+  Ulnar Art at Wrist          83        0.30               triphasic           +----------------------------+----------+-------------------+---------+----  ----+  Vein mapping +-----------------+-------------+----------+---------+  Right Cephalic   Diameter (cm)Depth (cm)Findings   +-----------------+-------------+----------+---------+  Shoulder            0.32        1.37              +-----------------+-------------+----------+---------+  Prox upper arm       0.32        0.92              +-----------------+-------------+----------+---------+  Mid  upper arm        0.27        0.41              +-----------------+-------------+----------+---------+  Dist upper arm       0.18        0.53   branching  +-----------------+-------------+----------+---------+  Antecubital fossa    0.21        0.44              +-----------------+-------------+----------+---------+  Prox forearm         0.15        0.44              +-----------------+-------------+----------+---------+  Mid forearm          0.13        0.44              +-----------------+-------------+----------+---------+  Dist forearm         0.21        0.35              +-----------------+-------------+----------+---------+  Wrist               0.13        0.38              +-----------------+-------------+----------+---------+   +-----------------+-------------+----------+---------+  Right Basilic    Diameter (cm)Depth (cm)Findings   +-----------------+-------------+----------+---------+  Prox upper arm       0.47        0.69              +-----------------+-------------+----------+---------+  Mid upper arm        0.30        1.17              +-----------------+-------------+----------+---------+  Dist upper arm       0.33        0.91              +-----------------+-------------+----------+---------+  Antecubital fossa    0.32        0.30   branching  +-----------------+-------------+----------+---------+  Prox forearm         0.23        0.24              +-----------------+-------------+----------+---------+  Mid forearm          0.25        0.21              +-----------------+-------------+----------+---------+  Distal forearm       0.20        0.32              +-----------------+-------------+----------+---------+  Wrist               0.16                          +-----------------+-------------+----------+---------+   +-----------------+-------------+----------+---------+  Left Cephalic  Diameter (cm)Depth (cm)Findings   +-----------------+-------------+----------+---------+  Shoulder            0.42        1.31              +-----------------+-------------+----------+---------+  Prox upper arm       0.28        0.99              +-----------------+-------------+----------+---------+  Mid upper arm        0.27        0.37              +-----------------+-------------+----------+---------+  Dist upper arm       0.34        0.53              +-----------------+-------------+----------+---------+  Antecubital fossa    0.22        0.67   branching  +-----------------+-------------+----------+---------+  Prox forearm         0.30        0.42   branching  +-----------------+-------------+----------+---------+  Mid forearm          0.19        0.35              +-----------------+-------------+----------+---------+  Dist forearm         0.21        0.43              +-----------------+-------------+----------+---------+  Wrist               0.21                          +-----------------+-------------+----------+---------+   +-----------------+-------------+----------+--------------+  Left Basilic     Diameter (cm)Depth (cm)   Findings     +-----------------+-------------+----------+--------------+  Shoulder                               not visualized  +-----------------+-------------+----------+--------------+  Prox upper arm                          not visualized  +-----------------+-------------+----------+--------------+  Mid upper arm        0.29                               +-----------------+-------------+----------+--------------+  Dist upper arm       0.25                               +-----------------+-------------+----------+--------------+  Antecubital fossa    0.29                               +-----------------+-------------+----------+--------------+  Prox forearm                             not visualized  +-----------------+-------------+----------+--------------+  Mid forearm                             not visualized  +-----------------+-------------+----------+--------------+  Distal forearm  not visualized  +-----------------+-------------+----------+--------------+  Wrist                                  not visualized  +-----------------+-------------+----------+--------------+   Note from nephrologist reviewed       Assessment/Plan:     Patrick [name] is a 58 y.o. male with ESRD who presents to discuss permanent access creation.  Dominant hand: {RIGHT/LEFT:20294} Previous access surgeries: *** Previous catheters: {RIGHT/LEFT:21944} IJ Currently dialyzing via *** on *** Other arm surgeries/injuries: ***  The vein mapping suggests there is a suitable left cephalic and we discussed that they are a candidate for AVF  The risks an benefits including of access creation were reviewed including: need for additional procedures, need for additional creations, steal, ischemia monomelic neuropathy, failure of access, and bleeding. The patient expressed understand and is willing to proceed.  I explained that I will perform intraoperative vein mapping to confirm the above findings and will determine the most appropriate access to create but with a preoperative plan for left arm brachiocephalic aVF.     Daria Pastures MD [facility]

## 2023-03-12 ENCOUNTER — Ambulatory Visit: Payer: No Typology Code available for payment source | Admitting: Vascular Surgery

## 2023-03-12 DIAGNOSIS — I739 Peripheral vascular disease, unspecified: Secondary | ICD-10-CM

## 2023-03-12 DIAGNOSIS — Z794 Long term (current) use of insulin: Secondary | ICD-10-CM

## 2023-03-12 DIAGNOSIS — N186 End stage renal disease: Secondary | ICD-10-CM

## 2023-03-21 ENCOUNTER — Other Ambulatory Visit: Payer: Self-pay | Admitting: Family Medicine

## 2023-03-21 DIAGNOSIS — R252 Cramp and spasm: Secondary | ICD-10-CM

## 2023-03-27 ENCOUNTER — Telehealth: Payer: Self-pay | Admitting: Orthopedic Surgery

## 2023-03-27 NOTE — Telephone Encounter (Signed)
 Because is has been greater than 6 months since he has been in the office insurance will require that he have a face to face visit in order to honor the rx. Can [facility] please call and make an appt?

## 2023-03-27 NOTE — Telephone Encounter (Signed)
Pt states he needs a new Rx for [employer]

## 2023-03-28 ENCOUNTER — Ambulatory Visit: Payer: Medicare Other | Admitting: Orthopedic Surgery

## 2023-03-28 ENCOUNTER — Encounter: Payer: Self-pay | Admitting: Orthopedic Surgery

## 2023-03-28 DIAGNOSIS — Z89511 Acquired absence of right leg below knee: Secondary | ICD-10-CM

## 2023-03-28 DIAGNOSIS — Z89512 Acquired absence of left leg below knee: Secondary | ICD-10-CM | POA: Diagnosis not present

## 2023-03-28 DIAGNOSIS — M1A09X0 Idiopathic chronic gout, multiple sites, without tophus (tophi): Secondary | ICD-10-CM

## 2023-03-28 MED ORDER — COLCHICINE 0.6 MG PO TABS: 0.6000 mg | ORAL_TABLET | ORAL | 3 refills | Status: DC

## 2023-03-28 NOTE — Progress Notes (Signed)
 Office Visit Note   Patient: Patrick [name]           Date of Birth: [birth year]           MRN: [id number] Visit Date: 03/28/2023              Requested by: [facility] [address] [place],  [place] [address] PCP: [facility]  Chief Complaint  Patient presents with   Right Leg - Follow-up    Hx right BKA      HPI: Patient is a 58 year old gentleman who presents with bilateral extremity weakness and bilateral transtibial amputations.  Patient states that recently has had some flareups of gout involving multiple joints.  Patient states he has a new socket on the right foot has a broken foot and ankle on the right at this time.  Assessment & Plan: Visit Diagnoses:  1. Left below-knee amputee (HCC)   2. Hx of BKA, right (HCC)   3. Idiopathic chronic gout of multiple sites without tophus     Plan: Prescription was called in for colchicine  he may take 1 tablet every 2 weeks as needed for gout.  Prescription was provided for strengthening and gait training at comp rehab due to weakness of both lower extremities and a prescription was provided for [employer] for a new foot ankle materials and supplies bilaterally.  Follow-Up Instructions: Return if symptoms worsen or fail to improve.   Ortho Exam  Patient is alert, oriented, no adenopathy, well-dressed, normal affect, normal respiratory effort. Examination patient has weakness with ambulation.  He has a broken foot and ankle for the right residual limb he has a new socket on the right no wound breakdown.  Patient recently had acute flareup of gout involving the upper extremities.  No active flareup at this time.  Patient's estimated GFR is 26.   Patient is an existing bilateral transtibial  amputee.  Patient's current comorbidities are not expected to impact the ability to function with the prescribed prosthesis. Patient verbally communicates a strong desire to use a prosthesis. Patient currently requires mobility aids to  ambulate without a prosthesis.  Expects not to use mobility aids with a new prosthesis.  Patient is a K3 level ambulator that spends a lot of time walking around on uneven terrain over obstacles, up and down stairs, and ambulates with a variable cadence.     Imaging: No results found. No images are attached to the encounter.  Labs: Lab Results  Component Value Date   HGBA1C 6.8 (H) 12/29/2022   HGBA1C 6.7 (H) 01/04/2022   HGBA1C 8.2 (H) 12/31/2020   ESRSEDRATE 85 (H) 01/08/2022   ESRSEDRATE 86 (H) 12/29/2021   ESRSEDRATE 95 (H) 09/17/2020   CRP 14.5 (H) 01/09/2022   CRP 10.3 (H) 12/29/2021   CRP 19.0 (H) 09/17/2020   LABURIC 9.0 (H) 01/04/2022   LABURIC 9.3 (H) 06/01/2021   LABURIC 11.2 (H) 08/29/2020   REPTSTATUS 01/10/2022 FINAL 01/08/2022   GRAMSTAIN  06/06/2020    RARE WBC PRESENT, PREDOMINANTLY PMN ABUNDANT GRAM POSITIVE COCCI RARE GRAM POSITIVE RODS    CULT  01/08/2022    NO GROWTH Performed at [facility], [address] [place], [place] [address]    LABORGA VANCOMYCIN  RESISTANT ENTEROCOCCUS (A) 08/26/2020     Lab Results  Component Value Date   ALBUMIN  3.1 (L) 02/01/2023   ALBUMIN  2.8 (L) 01/01/2023   ALBUMIN  2.7 (L) 12/30/2022   PREALBUMIN 8.2 (L) 09/17/2020   PREALBUMIN 5.1 (  L) 06/04/2020    Lab Results  Component Value Date   MG 1.4 (L) 01/02/2023   MG 1.7 01/01/2023   MG 1.8 12/31/2022   Lab Results  Component Value Date   VD25OH 22 (L) 06/01/2021    Lab Results  Component Value Date   PREALBUMIN 8.2 (L) 09/17/2020   PREALBUMIN 5.1 (L) 06/04/2020      Latest Ref Rng & Units 02/01/2023   11:47 PM 01/02/2023    5:10 AM 01/01/2023    5:09 AM  CBC EXTENDED  WBC 4.0 - 10.5 K/uL 11.9  16.1  20.2   RBC 4.22 - 5.81 MIL/uL 3.29  2.96  3.12   Hemoglobin 13.0 - 17.0 g/dL 9.2  8.1  8.5   HCT 60.9 - 52.0 % 30.1  25.7  27.0   Platelets 150 - 400 K/uL 277  419  446   NEUT# 1.7 - 7.7 K/uL 8.5     Lymph# 0.7 - 4.0 K/uL 1.7         There is no height or weight on file to calculate BMI.  Orders:  No orders of the defined types were placed in this encounter.  Meds ordered this encounter  Medications   colchicine  0.6 MG tablet    Sig: Take 1 tablet (0.6 mg total) by mouth every 14 (fourteen) days.    Dispense:  30 tablet    Refill:  3    Take 1 tablet every 2 weeks as needed for gout flareup.     Procedures: No procedures performed  Clinical Data: No additional findings.  ROS:  All other systems negative, except as noted in the HPI. Review of Systems  Objective: Vital Signs: There were no vitals taken for this visit.  Specialty Comments:  No specialty comments available.  PMFS History: Patient Active Problem List   Diagnosis Date Noted   History of osteomyelitis 12/29/2022   Acute renal failure superimposed on stage 3b chronic kidney disease, unspecified acute renal failure type (HCC) 12/29/2022   Elevated cholesterol 04/27/2022   Left below-knee amputee (HCC) 12/30/2020   Constipation 09/25/2020   Thrombocytosis 09/25/2020   S/P BKA (below knee amputation) unilateral, right (HCC) 09/01/2020   Anemia 08/27/2020   Dehiscence of amputation stump (HCC) 07/20/2020   Cutaneous abscess of right foot    Severe protein-calorie malnutrition (HCC)    CKD (chronic kidney disease) stage 3, GFR 30-59 ml/min (HCC) 06/04/2020   GOUT 01/27/2010   OBESITY 01/27/2010   Uncontrolled type 2 diabetes mellitus with hyperglycemia, with long-term current use of insulin  (HCC) 02/20/2008   Uncontrolled hypertension 02/20/2008   Past Medical History:  Diagnosis Date   ABLA (acute blood loss anemia) 09/01/2020   Acute respiratory disease due to COVID-19 virus 05/03/2019   Anemia    Cholelithiasis with acute cholecystitis 09/25/2020   Diabetes mellitus    Diabetic foot infection (HCC) 06/04/2020   Diabetic foot ulcer (HCC) 06/04/2020   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   Gout    History of complete ray  amputation of fifth toe of right foot (HCC) 07/20/2020   History of COVID-19 04/2019   Hypertension    Hypertensive urgency 09/25/2020   Hypoglycemia 01/03/2022   Osteomyelitis of right foot (HCC)    PVD (peripheral vascular disease) (HCC)    s/p R BKA   Right arm cellulitis 01/09/2022   Sepsis due to cellulitis (HCC) 06/04/2020   Subacute osteomyelitis, left ankle and foot (HCC)     Family History  Problem Relation Age of Onset   Diabetes Neg Hx     Past Surgical History:  Procedure Laterality Date   AMPUTATION Right 06/06/2020   Procedure: AMPUTATION 5TH RAY;  Surgeon: Harden Jerona GAILS, MD;  Location: [facility] OR;  Service: Orthopedics;  Laterality: Right;   AMPUTATION Right 06/08/2020   Procedure: RIGHT 4TH RAY AMPUTATION;  Surgeon: Harden Jerona GAILS, MD;  Location: [facility] OR;  Service: Orthopedics;  Laterality: Right;   AMPUTATION Right 08/27/2020   Procedure: AMPUTATION BELOW KNEE;  Surgeon: Lucilla Lynwood BRAVO, MD;  Location: [facility] OR;  Service: Orthopedics;  Laterality: Right;   AMPUTATION Left 12/30/2020   Procedure: LEFT BELOW KNEE AMPUTATION;  Surgeon: Harden Jerona GAILS, MD;  Location: [facility] OR;  Service: Orthopedics;  Laterality: Left;   CHOLECYSTECTOMY N/A 09/25/2020   Procedure: LAPAROSCOPIC CHOLECYSTECTOMY;  Surgeon: Paola Dreama SAILOR, MD;  Location: [facility] OR;  Service: General;  Laterality: N/A;   INSERTION OF DIALYSIS CATHETER N/A 01/01/2023   Procedure: INSERTION OF TUNNELED  DIALYSIS CATHETER RIGHT INTERNAL JUGULAR VEIN;  Surgeon: Lanis Fonda BRAVO, MD;  Location: [facility] OR;  Service: Vascular;  Laterality: N/A;   Social History   Occupational History   Occupation: logistics  Tobacco Use   Smoking status: Never    Passive exposure: Never   Smokeless tobacco: Never  Vaping Use   Vaping status: Never Used  Substance and Sexual Activity   Alcohol use: No   Drug use: No   Sexual activity: Not Currently

## 2023-04-01 NOTE — Progress Notes (Deleted)
 [facility] [place]  ASSESSMENT / PLAN: Patrick [name] is a 58 y.o. *** handed male in need of permanent dialysis access. I reviewed options for dialysis in detail with the patient, including hemodialysis and peritoneal dialysis. I counseled the patient to ask their nephrologist about their candidacy for renal transplant. I counseled the patient that dialysis access requires surveillance and periodic maintenance. Plan to proceed with ***.    CHIEF COMPLAINT: ***  HISTORY OF PRESENT ILLNESS: Patrick [name] is a 58 y.o. male ***  VASCULAR SURGICAL HISTORY: ***  VASCULAR RISK FACTORS: {FINDINGS; POSITIVE NEGATIVE:3314558952} history of stroke / transient ischemic attack. {FINDINGS; POSITIVE NEGATIVE:3314558952} history of coronary artery disease. *** history of PCI. *** history of CABG.  {FINDINGS; POSITIVE NEGATIVE:3314558952} history of diabetes mellitus. Last A1c ***. {FINDINGS; POSITIVE NEGATIVE:3314558952} history of smoking. *** actively smoking. {FINDINGS; POSITIVE NEGATIVE:3314558952} history of hypertension. *** drug regimen with *** control. {FINDINGS; POSITIVE NEGATIVE:3314558952} history of chronic kidney disease.  Last GFR ***. CKD {stage:30421363}. {FINDINGS; POSITIVE NEGATIVE:3314558952} history of chronic obstructive pulmonary disease, treated with ***.  FUNCTIONAL STATUS: ECOG performance status: {findings; ecog performance status:31780} Ambulatory status: {TNHAmbulation:25868}  CAREY 1 AND 3 YEAR INDEX Male (2pts) 75-79 or 80-84 (2pts) >84 (3pts) Dependence in toileting (1pt) Partial or full dependence in dressing (1pt) History of malignant neoplasm (2pts) CHF (3pts) COPD (1pts) CKD (3pts)  0-3 pts 6% 1 year mortality ; 21% 3 year mortality 4-5 pts 12% 1 year mortality ; 36% 3 year mortality >5 pts 21% 1 year mortality; 54% 3 year mortality   Past Medical History:  Diagnosis Date   ABLA (acute blood loss anemia) 09/01/2020   Acute  respiratory disease due to COVID-19 virus 05/03/2019   Anemia    Cholelithiasis with acute cholecystitis 09/25/2020   Diabetes mellitus    Diabetic foot infection (HCC) 06/04/2020   Diabetic foot ulcer (HCC) 06/04/2020   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   Gout    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/20/2020   History of COVID-19 04/2019   Hypertension    Hypertensive urgency 09/25/2020   Hypoglycemia 01/03/2022   Osteomyelitis of right foot (HCC)    PVD (peripheral vascular disease) (HCC)    s/p R BKA   Right arm cellulitis 01/09/2022   Sepsis due to cellulitis (HCC) 06/04/2020   Subacute osteomyelitis, left ankle and foot [facility])     Past Surgical History:  Procedure Laterality Date   AMPUTATION Right 06/06/2020   Procedure: AMPUTATION 5TH RAY;  Surgeon: Nadara Mustard, MD;  Location: [facility] OR;  Service: Orthopedics;  Laterality: Right;   AMPUTATION Right 06/08/2020   Procedure: RIGHT 4TH RAY AMPUTATION;  Surgeon: Nadara Mustard, MD;  Location: [facility] OR;  Service: Orthopedics;  Laterality: Right;   AMPUTATION Right 08/27/2020   Procedure: AMPUTATION BELOW KNEE;  Surgeon: Kerrin Champagne, MD;  Location: [facility] OR;  Service: Orthopedics;  Laterality: Right;   AMPUTATION Left 12/30/2020   Procedure: LEFT BELOW KNEE AMPUTATION;  Surgeon: Nadara Mustard, MD;  Location: [facility] OR;  Service: Orthopedics;  Laterality: Left;   CHOLECYSTECTOMY N/A 09/25/2020   Procedure: LAPAROSCOPIC CHOLECYSTECTOMY;  Surgeon: Diamantina Monks, MD;  Location: [facility] OR;  Service: General;  Laterality: N/A;   INSERTION OF DIALYSIS CATHETER N/A 01/01/2023   Procedure: INSERTION OF TUNNELED  DIALYSIS CATHETER RIGHT INTERNAL JUGULAR VEIN;  Surgeon: Victorino Sparrow, MD;  Location: [facility] OR;  Service: Vascular;  Laterality: N/A;  Family History  Problem Relation Age of Onset   Diabetes Neg Hx     Social History   Socioeconomic History   Marital status: Divorced    Spouse name: Not on file    Number of children: Not on file   Years of education: Not on file   Highest education level: Not on file  Occupational History   Occupation: logistics  Tobacco Use   Smoking status: Never    Passive exposure: Never   Smokeless tobacco: Never  Vaping Use   Vaping status: Never Used  Substance and Sexual Activity   Alcohol use: No   Drug use: No   Sexual activity: Not Currently  Other Topics Concern   Not on file  Social History Narrative   Not on file   Social Drivers of Health   Financial Resource Strain: Not on file  Food Insecurity: No Food Insecurity (12/29/2022)   Hunger Vital Sign    Worried About Running Out of Food in the Last Year: Never true    Ran Out of Food in the Last Year: Never true  Transportation Needs: No Transportation Needs (12/29/2022)   PRAPARE - Administrator, Civil Service (Medical): No    Lack of Transportation (Non-Medical): No  Physical Activity: Not on file  Stress: Not on file  Social Connections: Unknown (06/30/2021)   [facility] from [facility], [facility]   Social Network    Social Network: Not on file  Intimate Partner Violence: Not At Risk (12/29/2022)   Humiliation, Afraid, Rape, and Kick questionnaire    Fear of Current or Ex-Partner: No    Emotionally Abused: No    Physically Abused: No    Sexually Abused: No    No Known Allergies  Current Outpatient Medications  Medication Sig Dispense Refill   amLODipine (NORVASC) 10 MG tablet Take 1 tablet (10 mg total) by mouth daily. 90 tablet 2   calcitRIOL (ROCALTROL) 0.5 MCG capsule Take 1 capsule (0.5 mcg total) by mouth daily. 30 capsule 1   calcium carbonate (TUMS - DOSED IN MG ELEMENTAL CALCIUM) 500 MG chewable tablet Chew 1 tablet (200 mg of elemental calcium total) by mouth 3 (three) times daily. 90 tablet 1   colchicine 0.6 MG tablet Take 1 tablet (0.6 mg total) by mouth every 14 (fourteen) days. 30 tablet 3   hydrALAZINE (APRESOLINE) 25 MG tablet Take 1 tablet (25  mg total) by mouth every 8 (eight) hours. 90 tablet 1   insulin aspart (NOVOLOG) 100 UNIT/ML FlexPen Inject 4 Units into the skin 3 (three) times daily with meals. 15 mL 1   Insulin Glargine (BASAGLAR KWIKPEN) 100 UNIT/ML Inject 15 Units into the skin at bedtime. 15 mL 1   Insulin Pen Needle 32G X 4 MM MISC Use with insulin pens 100 each 0   metoprolol tartrate (LOPRESSOR) 25 MG tablet Take 1 tablet (25 mg total) by mouth 2 (two) times daily. 60 tablet 3   OZEMPIC, 1 MG/DOSE, 4 MG/3ML SOPN Inject 1 mg into the skin once a week.     simvastatin (ZOCOR) 10 MG tablet Take 1 tablet (10 mg total) by mouth at bedtime. 90 tablet 3   sodium bicarbonate 650 MG tablet Take 2 tablets (1,300 mg total) by mouth 2 (two) times daily. 60 tablet 1   torsemide (DEMADEX) 100 MG tablet Take 1 tablet (100 mg total) by mouth daily. 30 tablet 1   No current facility-administered medications for this visit.   Facility-Administered  Medications Ordered in Other Visits  Medication Dose Route Frequency Provider Last Rate Last Admin   ketamine (KETALAR) bolus via infusion 68.05 mg  0.5 mg/kg Intravenous Once Nadara Mustard, MD        PHYSICAL EXAM There were no vitals filed for this visit.  Constitutional: *** appearing. *** distress. Appears *** nourished.  Neurologic: CN ***. *** focal findings. *** sensory loss. Psychiatric: *** Mood and affect symmetric and appropriate. Eyes: *** No icterus. No conjunctival pallor. Ears, nose, throat: *** mucous membranes moist. Midline trachea.  Cardiac: *** rate and rhythm.  Respiratory: *** unlabored. Abdominal: *** soft, non-tender, non-distended.  Peripheral vascular: *** Extremity: *** edema. *** cyanosis. *** pallor.  Skin: *** gangrene. *** ulceration.  Lymphatic: *** Stemmer's sign. *** palpable lymphadenopathy.    PERTINENT LABORATORY AND RADIOLOGIC DATA  Most recent CBC    Latest Ref Rng & Units 02/01/2023   11:47 PM 01/02/2023    5:10 AM 01/01/2023     5:09 AM  CBC  WBC 4.0 - 10.5 K/uL 11.9  16.1  20.2   Hemoglobin 13.0 - 17.0 g/dL 9.2  8.1  8.5   Hematocrit 39.0 - 52.0 % 30.1  25.7  27.0   Platelets 150 - 400 K/uL 277  419  446      Most recent CMP    Latest Ref Rng & Units 02/01/2023   11:47 PM 01/02/2023    5:10 AM 01/01/2023    5:12 AM  CMP  Glucose 70 - 99 mg/dL 045  409  811   BUN 6 - 20 mg/dL 55  69  76   Creatinine 0.61 - 1.24 mg/dL 9.14  7.82  9.56   Sodium 135 - 145 mmol/L 137  135  136   Potassium 3.5 - 5.1 mmol/L 3.8  5.1  5.7   Chloride 98 - 111 mmol/L 100  104  104   CO2 22 - 32 mmol/L 23  19  18    Calcium 8.9 - 10.3 mg/dL 6.9  6.3  6.5   Total Protein 6.5 - 8.1 g/dL 6.6     Total Bilirubin <1.2 mg/dL 1.5     Alkaline Phos 38 - 126 U/L 65     AST 15 - 41 U/L 15     ALT 0 - 44 U/L 11       Renal function CrCl cannot be calculated (Patient's most recent lab result is older than the maximum 21 days allowed.).  Hgb A1c MFr Bld (%)  Date Value  12/29/2022 6.8 (H)    LDL Cholesterol (Calc)  Date Value Ref Range Status  06/01/2021 115 (H) mg/dL (calc) Final    Comment:    Reference range: <100 . Desirable range <100 mg/dL for primary prevention;   <70 mg/dL for patients with CHD or diabetic patients  with > or = 2 CHD risk factors. Marland Kitchen LDL-C is now calculated using the Martin-Hopkins  calculation, which is a validated novel method providing  better accuracy than the Friedewald equation in the  estimation of LDL-C.  Horald Pollen et al. Lenox Ahr. 2130;865(78): 2061-2068  (http://education.[employer].com/faq/FAQ164)      Vascular Imaging: ***  Rande Brunt. Lenell Antu, MD FACS [facility] Phone Number: [phone] 04/01/2023 5:01 PM   Total time spent on preparing this encounter including chart review, data review, collecting history, examining the patient, coordinating care for this {tnhtimebilling:26202}  Portions of this report may have been transcribed using  voice recognition software.  Every effort has been made to ensure accuracy; however, inadvertent computerized transcription errors may still be present.

## 2023-04-02 ENCOUNTER — Encounter: Payer: No Typology Code available for payment source | Admitting: Vascular Surgery

## 2023-04-08 ENCOUNTER — Telehealth: Payer: Self-pay | Admitting: Orthopedic Surgery

## 2023-04-08 NOTE — Telephone Encounter (Signed)
 Pt called requesting a prescription for motorized scooter. Please call pt when script is ready for pick up. Pt phone number is [phone]

## 2023-04-09 ENCOUNTER — Ambulatory Visit: Payer: No Typology Code available for payment source | Attending: Orthopedic Surgery | Admitting: Physical Therapy

## 2023-04-11 ENCOUNTER — Ambulatory Visit: Payer: No Typology Code available for payment source | Admitting: Orthopedic Surgery

## 2023-04-23 ENCOUNTER — Ambulatory Visit: Payer: No Typology Code available for payment source | Attending: Orthopedic Surgery | Admitting: Physical Therapy

## 2023-04-25 ENCOUNTER — Encounter: Payer: No Typology Code available for payment source | Admitting: Vascular Surgery

## 2023-05-02 NOTE — Telephone Encounter (Signed)
 Pt does not need a power scooter he just needs an rx to submit to supplier for tax purposes. He will call back and leave a message with the fax number to send rx.

## 2023-05-07 NOTE — Progress Notes (Deleted)
 Office Note     CC:  *** Requesting Provider:  Anthony Sar, MD  HPI: Patrick [name] is a 58 y.o. ([birth year]) male presenting at the request of .[facility] ***  The pt is *** on a statin for cholesterol management.  The pt is *** on a daily aspirin.   Other AC:  *** The pt is *** on medication for hypertension.   The pt is *** diabetic.  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/20/2020   History of COVID-19 04/2019   Hypertension    Hypertensive urgency 09/25/2020   Hypoglycemia 01/03/2022   Osteomyelitis of right foot (HCC)    PVD (peripheral vascular disease) (HCC)    s/p R BKA   Right arm cellulitis 01/09/2022   Sepsis due to cellulitis (HCC) 06/04/2020   Subacute osteomyelitis, left ankle and foot [facility])     Past Surgical History:  Procedure Laterality Date   AMPUTATION Right 06/06/2020   Procedure: AMPUTATION 5TH RAY;  Surgeon: Nadara Mustard, MD;  Location: [facility] OR;  Service: Orthopedics;  Laterality: Right;   AMPUTATION Right 06/08/2020   Procedure: RIGHT 4TH RAY AMPUTATION;  Surgeon: Nadara Mustard, MD;  Location: [facility] OR;  Service: Orthopedics;  Laterality: Right;   AMPUTATION Right 08/27/2020   Procedure: AMPUTATION BELOW KNEE;  Surgeon: Kerrin Champagne, MD;  Location: [facility] OR;  Service: Orthopedics;  Laterality: Right;   AMPUTATION Left 12/30/2020   Procedure: LEFT BELOW KNEE AMPUTATION;  Surgeon: Nadara Mustard, MD;  Location: [facility] OR;  Service: Orthopedics;  Laterality: Left;   CHOLECYSTECTOMY N/A 09/25/2020   Procedure: LAPAROSCOPIC CHOLECYSTECTOMY;  Surgeon:  Diamantina Monks, MD;  Location: [facility] OR;  Service: General;  Laterality: N/A;   INSERTION OF DIALYSIS CATHETER N/A 01/01/2023   Procedure: INSERTION OF TUNNELED  DIALYSIS CATHETER RIGHT INTERNAL JUGULAR VEIN;  Surgeon: Victorino Sparrow, MD;  Location: [facility] OR;  Service: Vascular;  Laterality: N/A;    Social History   Socioeconomic History   Marital status: Divorced    Spouse name: Not on file   Number of children: Not on file   Years of education: Not on file   Highest education level: Not on file  Occupational History   Occupation: logistics  Tobacco Use   Smoking status: Never    Passive exposure: Never   Smokeless tobacco: Never  Vaping Use   Vaping status: Never Used  Substance and Sexual Activity   Alcohol use: No   Drug use: No   Sexual activity: Not Currently  Other Topics Concern   Not on file  Social History Narrative   Not on file   Social Drivers of Health   Financial Resource Strain: Not on file  Food Insecurity: No Food Insecurity (12/29/2022)   Hunger Vital Sign    Worried About Running Out of Food in the Last Year: Never true    Ran Out of Food in the Last Year: Never true  Transportation Needs: No Transportation Needs (12/29/2022)   PRAPARE - Transportation  Lack of Transportation (Medical): No    Lack of Transportation (Non-Medical): No  Physical Activity: Not on file  Stress: Not on file  Social Connections: Unknown (06/30/2021)   Received from [facility], [facility]   Social Network    Social Network: Not on file  Intimate Partner Violence: Not At Risk (12/29/2022)   Humiliation, Afraid, Rape, and Kick questionnaire    Fear of Current or Ex-Partner: No    Emotionally Abused: No    Physically Abused: No    Sexually Abused: No   *** Family History  Problem Relation Age of Onset   Diabetes Neg Hx     Current Outpatient Medications  Medication Sig Dispense Refill   amLODipine (NORVASC) 10 MG tablet Take 1 tablet (10 mg total) by mouth  daily. 90 tablet 2   calcitRIOL (ROCALTROL) 0.5 MCG capsule Take 1 capsule (0.5 mcg total) by mouth daily. 30 capsule 1   calcium carbonate (TUMS - DOSED IN MG ELEMENTAL CALCIUM) 500 MG chewable tablet Chew 1 tablet (200 mg of elemental calcium total) by mouth 3 (three) times daily. 90 tablet 1   colchicine 0.6 MG tablet Take 1 tablet (0.6 mg total) by mouth every 14 (fourteen) days. 30 tablet 3   hydrALAZINE (APRESOLINE) 25 MG tablet Take 1 tablet (25 mg total) by mouth every 8 (eight) hours. 90 tablet 1   insulin aspart (NOVOLOG) 100 UNIT/ML FlexPen Inject 4 Units into the skin 3 (three) times daily with meals. 15 mL 1   Insulin Glargine (BASAGLAR KWIKPEN) 100 UNIT/ML Inject 15 Units into the skin at bedtime. 15 mL 1   Insulin Pen Needle 32G X 4 MM MISC Use with insulin pens 100 each 0   metoprolol tartrate (LOPRESSOR) 25 MG tablet Take 1 tablet (25 mg total) by mouth 2 (two) times daily. 60 tablet 3   OZEMPIC, 1 MG/DOSE, 4 MG/3ML SOPN Inject 1 mg into the skin once a week.     simvastatin (ZOCOR) 10 MG tablet Take 1 tablet (10 mg total) by mouth at bedtime. 90 tablet 3   sodium bicarbonate 650 MG tablet Take 2 tablets (1,300 mg total) by mouth 2 (two) times daily. 60 tablet 1   torsemide (DEMADEX) 100 MG tablet Take 1 tablet (100 mg total) by mouth daily. 30 tablet 1   No current facility-administered medications for this visit.   Facility-Administered Medications Ordered in Other Visits  Medication Dose Route Frequency Provider Last Rate Last Admin   ketamine (KETALAR) bolus via infusion 68.05 mg  0.5 mg/kg Intravenous Once Nadara Mustard, MD        No Known Allergies   REVIEW OF SYSTEMS:  *** [X]  denotes positive finding, [ ]  denotes negative finding Cardiac  Comments:  Chest pain or chest pressure:    Shortness of breath upon exertion:    Short of breath when lying flat:    Irregular heart rhythm:        Vascular    Pain in calf, thigh, or hip brought on by ambulation:     Pain in feet at night that wakes you up from your sleep:     Blood clot in your veins:    Leg swelling:         Pulmonary    Oxygen at home:    Productive cough:     Wheezing:         Neurologic    Sudden weakness in arms or legs:     Sudden numbness  in arms or legs:     Sudden onset of difficulty speaking or slurred speech:    Temporary loss of vision in one eye:     Problems with dizziness:         Gastrointestinal    Blood in stool:     Vomited blood:         Genitourinary    Burning when urinating:     Blood in urine:        Psychiatric    Major depression:         Hematologic    Bleeding problems:    Problems with blood clotting too easily:        Skin    Rashes or ulcers:        Constitutional    Fever or chills:      PHYSICAL EXAMINATION:  There were no vitals filed for this visit.  General:  WDWN in NAD; vital signs documented above Gait: Not observed HENT: WNL, normocephalic Pulmonary: normal non-labored breathing , without wheezing Cardiac: {Desc; regular/irreg:14544} HR Abdomen: soft, NT, no masses Skin: {With/Without:20273} rashes Vascular Exam/Pulses:  Right Left  Radial {Exam; arterial pulse strength 0-4:30167} {Exam; arterial pulse strength 0-4:30167}  Ulnar {Exam; arterial pulse strength 0-4:30167} {Exam; arterial pulse strength 0-4:30167}  Femoral {Exam; arterial pulse strength 0-4:30167} {Exam; arterial pulse strength 0-4:30167}  Popliteal {Exam; arterial pulse strength 0-4:30167} {Exam; arterial pulse strength 0-4:30167}  DP {Exam; arterial pulse strength 0-4:30167} {Exam; arterial pulse strength 0-4:30167}  PT {Exam; arterial pulse strength 0-4:30167} {Exam; arterial pulse strength 0-4:30167}   Extremities: {With/Without:20273} ischemic changes, {With/Without:20273} Gangrene , {With/Without:20273} cellulitis; {With/Without:20273} open wounds;  Musculoskeletal: no muscle wasting or atrophy  Neurologic: A&O X 3;  No focal weakness or  paresthesias are detected Psychiatric:  The pt has {Desc; normal/abnormal:11317::"Normal"} affect.   Non-Invasive Vascular Imaging:   ***    ASSESSMENT/PLAN: Patrick [name] is a 58 y.o. male presenting with ***   ***   Victorino Sparrow, MD [facility] [phone]

## 2023-05-09 ENCOUNTER — Encounter: Admitting: Vascular Surgery

## 2023-05-13 NOTE — Progress Notes (Deleted)
 Patient name: Patrick [name] MRN: [id number] DOB: [birth year] Sex: male  REASON FOR CONSULT: Permanent access placement  HPI: Patrick [name] is a 58 y.o. male, with history of hypertension, diabetes, PAD that presents for evaluation of new access.  Patient is known to our service from previous had a right IJ TDC on 01/01/2023 by Dr. Sherral Hammers.  I did have vein mapping on 12/31/2022.  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/20/2020   History of COVID-19 04/2019   Hypertension    Hypertensive urgency 09/25/2020   Hypoglycemia 01/03/2022   Osteomyelitis of right foot (HCC)    PVD (peripheral vascular disease) (HCC)    s/p R BKA   Right arm cellulitis 01/09/2022   Sepsis due to cellulitis (HCC) 06/04/2020   Subacute osteomyelitis, left ankle and foot [facility])     Past Surgical History:  Procedure Laterality Date   AMPUTATION Right 06/06/2020   Procedure: AMPUTATION 5TH RAY;  Surgeon: Nadara Mustard, MD;  Location: [facility] OR;  Service: Orthopedics;  Laterality: Right;   AMPUTATION Right 06/08/2020   Procedure: RIGHT 4TH RAY AMPUTATION;  Surgeon: Nadara Mustard, MD;  Location: [facility] OR;  Service: Orthopedics;  Laterality: Right;   AMPUTATION Right 08/27/2020   Procedure: AMPUTATION BELOW KNEE;  Surgeon: Kerrin Champagne, MD;  Location: [facility] OR;  Service: Orthopedics;  Laterality: Right;   AMPUTATION Left 12/30/2020   Procedure: LEFT BELOW KNEE AMPUTATION;  Surgeon: Nadara Mustard, MD;  Location: [facility] OR;  Service: Orthopedics;  Laterality: Left;   CHOLECYSTECTOMY N/A 09/25/2020   Procedure: LAPAROSCOPIC CHOLECYSTECTOMY;   Surgeon: Diamantina Monks, MD;  Location: [facility] OR;  Service: General;  Laterality: N/A;   INSERTION OF DIALYSIS CATHETER N/A 01/01/2023   Procedure: INSERTION OF TUNNELED  DIALYSIS CATHETER RIGHT INTERNAL JUGULAR VEIN;  Surgeon: Victorino Sparrow, MD;  Location: [facility] OR;  Service: Vascular;  Laterality: N/A;    Family History  Problem Relation Age of Onset   Diabetes Neg Hx     SOCIAL HISTORY: Social History   Socioeconomic History   Marital status: Divorced    Spouse name: Not on file   Number of children: Not on file   Years of education: Not on file   Highest education level: Not on file  Occupational History   Occupation: logistics  Tobacco Use   Smoking status: Never    Passive exposure: Never   Smokeless tobacco: Never  Vaping Use   Vaping status: Never Used  Substance and Sexual Activity   Alcohol use: No   Drug use: No   Sexual activity: Not Currently  Other Topics Concern   Not on file  Social History Narrative   Not on file   Social Drivers of Health   Financial Resource Strain: Not on file  Food Insecurity: No Food Insecurity (12/29/2022)   Hunger Vital Sign    Worried About Running Out of Food in the Last Year: Never true    Ran Out of Food in the Last Year: Never true  Transportation Needs: No Transportation Needs (12/29/2022)  PRAPARE - Administrator, Civil Service (Medical): No    Lack of Transportation (Non-Medical): No  Physical Activity: Not on file  Stress: Not on file  Social Connections: Unknown (06/30/2021)   Received from [facility], [facility]   Social Network    Social Network: Not on file  Intimate Partner Violence: Not At Risk (12/29/2022)   Humiliation, Afraid, Rape, and Kick questionnaire    Fear of Current or Ex-Partner: No    Emotionally Abused: No    Physically Abused: No    Sexually Abused: No    No Known Allergies  Current Outpatient Medications  Medication Sig Dispense Refill   amLODipine (NORVASC) 10 MG  tablet Take 1 tablet (10 mg total) by mouth daily. 90 tablet 2   calcitRIOL (ROCALTROL) 0.5 MCG capsule Take 1 capsule (0.5 mcg total) by mouth daily. 30 capsule 1   calcium carbonate (TUMS - DOSED IN MG ELEMENTAL CALCIUM) 500 MG chewable tablet Chew 1 tablet (200 mg of elemental calcium total) by mouth 3 (three) times daily. 90 tablet 1   colchicine 0.6 MG tablet Take 1 tablet (0.6 mg total) by mouth every 14 (fourteen) days. 30 tablet 3   hydrALAZINE (APRESOLINE) 25 MG tablet Take 1 tablet (25 mg total) by mouth every 8 (eight) hours. 90 tablet 1   insulin aspart (NOVOLOG) 100 UNIT/ML FlexPen Inject 4 Units into the skin 3 (three) times daily with meals. 15 mL 1   Insulin Glargine (BASAGLAR KWIKPEN) 100 UNIT/ML Inject 15 Units into the skin at bedtime. 15 mL 1   Insulin Pen Needle 32G X 4 MM MISC Use with insulin pens 100 each 0   metoprolol tartrate (LOPRESSOR) 25 MG tablet Take 1 tablet (25 mg total) by mouth 2 (two) times daily. 60 tablet 3   OZEMPIC, 1 MG/DOSE, 4 MG/3ML SOPN Inject 1 mg into the skin once a week.     simvastatin (ZOCOR) 10 MG tablet Take 1 tablet (10 mg total) by mouth at bedtime. 90 tablet 3   sodium bicarbonate 650 MG tablet Take 2 tablets (1,300 mg total) by mouth 2 (two) times daily. 60 tablet 1   torsemide (DEMADEX) 100 MG tablet Take 1 tablet (100 mg total) by mouth daily. 30 tablet 1   No current facility-administered medications for this visit.   Facility-Administered Medications Ordered in Other Visits  Medication Dose Route Frequency Provider Last Rate Last Admin   ketamine (KETALAR) bolus via infusion 68.05 mg  0.5 mg/kg Intravenous Once Nadara Mustard, MD        REVIEW OF SYSTEMS:  [X]  denotes positive finding, [ ]  denotes negative finding Cardiac  Comments:  Chest pain or chest pressure: ***   Shortness of breath upon exertion:    Short of breath when lying flat:    Irregular heart rhythm:        Vascular    Pain in calf, thigh, or hip brought on by  ambulation:    Pain in feet at night that wakes you up from your sleep:     Blood clot in your veins:    Leg swelling:         Pulmonary    Oxygen at home:    Productive cough:     Wheezing:         Neurologic    Sudden weakness in arms or legs:     Sudden numbness in arms or legs:     Sudden onset of difficulty speaking  or slurred speech:    Temporary loss of vision in one eye:     Problems with dizziness:         Gastrointestinal    Blood in stool:     Vomited blood:         Genitourinary    Burning when urinating:     Blood in urine:        Psychiatric    Major depression:         Hematologic    Bleeding problems:    Problems with blood clotting too easily:        Skin    Rashes or ulcers:        Constitutional    Fever or chills:      PHYSICAL EXAM: There were no vitals filed for this visit.  GENERAL: The patient is a well-nourished male, in no acute distress. The vital signs are documented above. CARDIAC: There is a regular rate and rhythm.  VASCULAR: *** PULMONARY: There is good air exchange bilaterally without wheezing or rales. ABDOMEN: Soft and non-tender with normal pitched bowel sounds.  MUSCULOSKELETAL: There are no major deformities or cyanosis. NEUROLOGIC: No focal weakness or paresthesias are detected. SKIN: There are no ulcers or rashes noted. PSYCHIATRIC: The patient has a normal affect.  DATA:   Vein mapping 12/31/2022  UPPER EXTREMITY VEIN MAPPING  Patient Name:  Patrick [name]  Date of Exam:   12/31/2022 Medical Rec #: [id number]     Accession #:    [id number] Date of Birth: 1965-04-08     Patient Gender: M Patient Age:   59 years Exam Location:  [facility] Procedure:      VAS [place] UPPER EXT VEIN MAPPING (PRE-OP AVF) Referring Phys: Estill Bakes   --------------------------------------------------------------------------- -----   Indications: Pre-access.  History: Hypertension, hyperlipidemia, diabetes, CKD,.   Performing Technologist: Marilynne Halsted RDMS, RVT    Examination Guidelines: A complete evaluation includes B-mode imaging, spectral Doppler, color Doppler, and power Doppler as needed of all accessible portions of each vessel. Bilateral testing is considered an integral part of a complete examination. Limited examinations for reoccurring indications may be performed as noted.  +-----------------+-------------+----------+---------+ Right Cephalic   Diameter (cm)Depth (cm)Findings  +-----------------+-------------+----------+---------+ Shoulder             0.32        1.37             +-----------------+-------------+----------+---------+ Prox upper arm       0.32        0.92             +-----------------+-------------+----------+---------+ Mid upper arm        0.27        0.41             +-----------------+-------------+----------+---------+ Dist upper arm       0.18        0.53   branching +-----------------+-------------+----------+---------+ Antecubital fossa    0.21        0.44             +-----------------+-------------+----------+---------+ Prox forearm         0.15        0.44             +-----------------+-------------+----------+---------+ Mid forearm          0.13        0.44             +-----------------+-------------+----------+---------+ Dist forearm  0.21        0.35             +-----------------+-------------+----------+---------+ Wrist                0.13        0.38             +-----------------+-------------+----------+---------+  +-----------------+-------------+----------+---------+ Right Basilic    Diameter (cm)Depth (cm)Findings  +-----------------+-------------+----------+---------+ Prox upper arm       0.47        0.69             +-----------------+-------------+----------+---------+ Mid upper arm        0.30        1.17              +-----------------+-------------+----------+---------+ Dist upper arm       0.33        0.91             +-----------------+-------------+----------+---------+ Antecubital fossa    0.32        0.30   branching +-----------------+-------------+----------+---------+ Prox forearm         0.23        0.24             +-----------------+-------------+----------+---------+ Mid forearm          0.25        0.21             +-----------------+-------------+----------+---------+ Distal forearm       0.20        0.32             +-----------------+-------------+----------+---------+ Wrist                0.16                         +-----------------+-------------+----------+---------+  +-----------------+-------------+----------+---------+ Left Cephalic    Diameter (cm)Depth (cm)Findings  +-----------------+-------------+----------+---------+ Shoulder             0.42        1.31             +-----------------+-------------+----------+---------+ Prox upper arm       0.28        0.99             +-----------------+-------------+----------+---------+ Mid upper arm        0.27        0.37             +-----------------+-------------+----------+---------+ Dist upper arm       0.34        0.53             +-----------------+-------------+----------+---------+ Antecubital fossa    0.22        0.67   branching +-----------------+-------------+----------+---------+ Prox forearm         0.30        0.42   branching +-----------------+-------------+----------+---------+ Mid forearm          0.19        0.35             +-----------------+-------------+----------+---------+ Dist forearm         0.21        0.43             +-----------------+-------------+----------+---------+ Wrist                0.21                          +-----------------+-------------+----------+---------+  +-----------------+-------------+----------+--------------+  Left Basilic     Diameter (cm)Depth (cm)   Findings    +-----------------+-------------+----------+--------------+ Shoulder                                not visualized +-----------------+-------------+----------+--------------+ Prox upper arm                          not visualized +-----------------+-------------+----------+--------------+ Mid upper arm        0.29                              +-----------------+-------------+----------+--------------+ Dist upper arm       0.25                              +-----------------+-------------+----------+--------------+ Antecubital fossa    0.29                              +-----------------+-------------+----------+--------------+ Prox forearm                            not visualized +-----------------+-------------+----------+--------------+ Mid forearm                             not visualized +-----------------+-------------+----------+--------------+ Distal forearm                          not visualized +-----------------+-------------+----------+--------------+ Wrist                                   not visualized +-----------------+-------------+----------+--------------+  *See table(s) above for measurements and observations.     Diagnosing physician: Gerarda Fraction Electronically signed by Gerarda Fraction on 12/31/2022 at 6:12:31 PM.      Assessment/Plan:  58 y.o. male, with history of hypertension, diabetes, PAD that presents for evaluation of new access.  I discussed access placement in the nondominant arm.  Recent vein mapping showed a usable right basilic and a left cephalic.  Risk benefits discussed including risk of bleeding, infection, failure to mature, steal syndrome.   Cephus Shelling, MD [facility]  [place] Office: [phone]

## 2023-05-14 ENCOUNTER — Other Ambulatory Visit: Payer: Self-pay | Admitting: *Deleted

## 2023-05-14 ENCOUNTER — Ambulatory Visit (HOSPITAL_COMMUNITY): Admission: RE | Admit: 2023-05-14 | Source: Ambulatory Visit

## 2023-05-14 ENCOUNTER — Inpatient Hospital Stay (HOSPITAL_COMMUNITY): Admission: RE | Admit: 2023-05-14 | Source: Ambulatory Visit

## 2023-05-14 ENCOUNTER — Encounter: Admitting: Vascular Surgery

## 2023-05-14 DIAGNOSIS — N186 End stage renal disease: Secondary | ICD-10-CM

## 2023-06-03 IMAGING — CT CT RENAL STONE PROTOCOL
2 of 4 series · 16 of 46 positions shown, 18 images · non-contrast
Comparison: No priors.

CLINICAL DATA: 54-year-old male with history of right-sided flank
pain. Three recent falls since 08/27/2020.

EXAM:
CT ABDOMEN AND PELVIS WITHOUT CONTRAST
TECHNIQUE: Multidetector CT imaging of the abdomen and pelvis was performed
following the standard protocol without IV contrast.

[Series 2: axial st · axial · 0.84mm/px · z∈[-489,-39]mm · 13 of 100 slices shown, 15 images]
[im 5/100  soft-tissue]
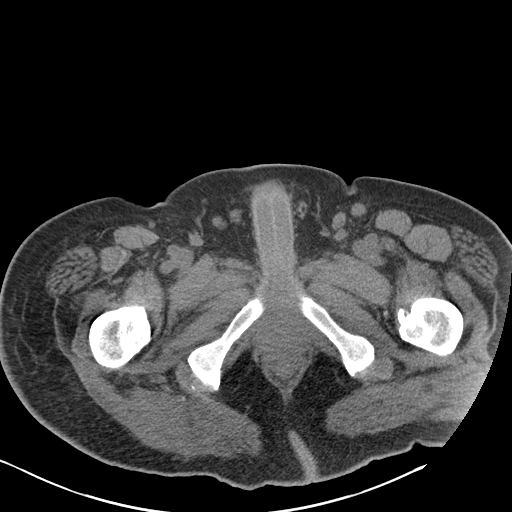
[im 5/100  bone]
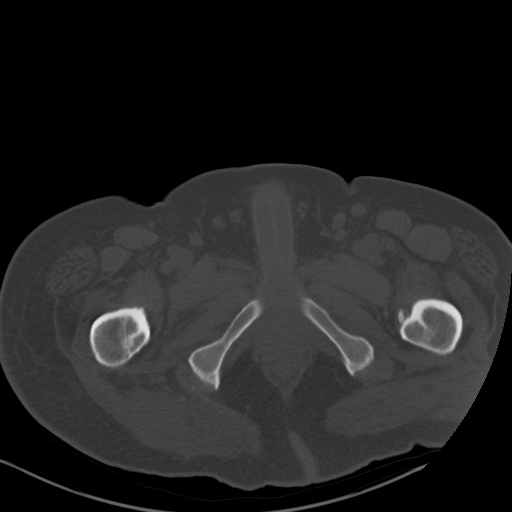
[im 13/100  soft-tissue]
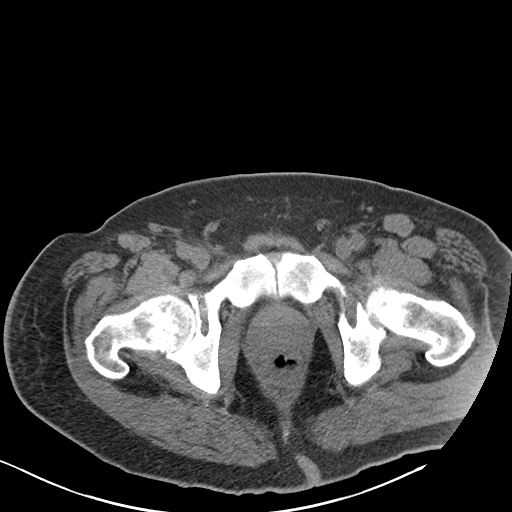
[im 22/100  soft-tissue]
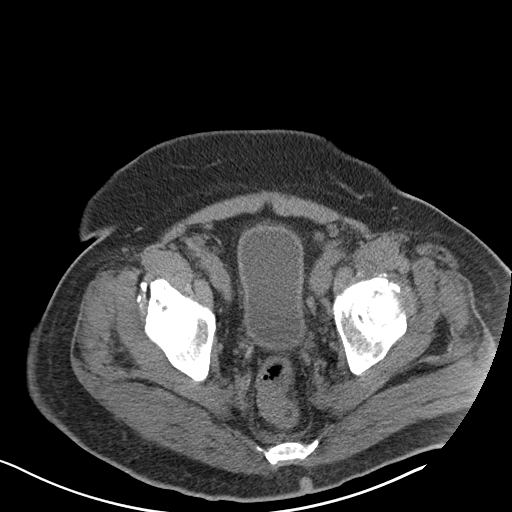
[im 26/100  soft-tissue]
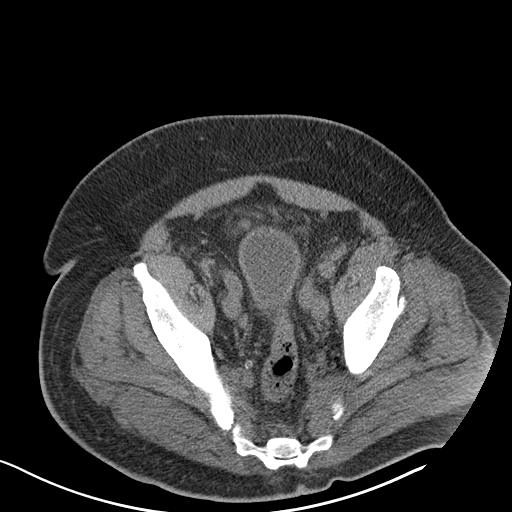
[im 35/100  soft-tissue]
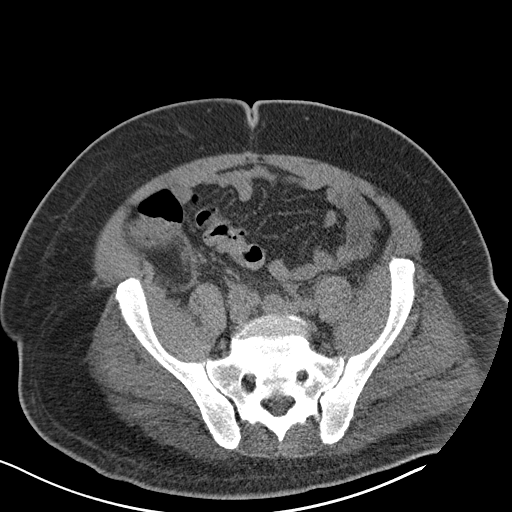
[im 44/100  soft-tissue]
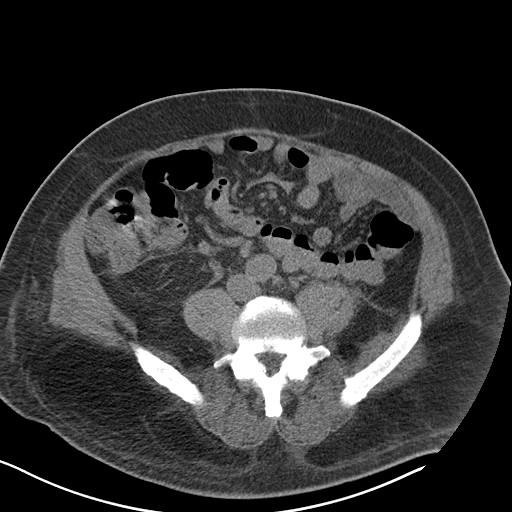
[im 52/100  soft-tissue]
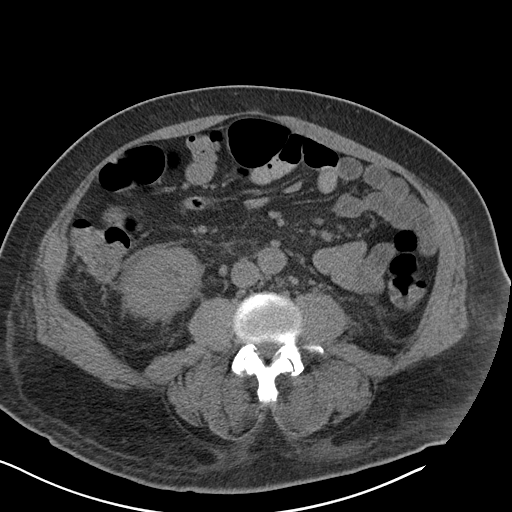
[im 56/100  soft-tissue]
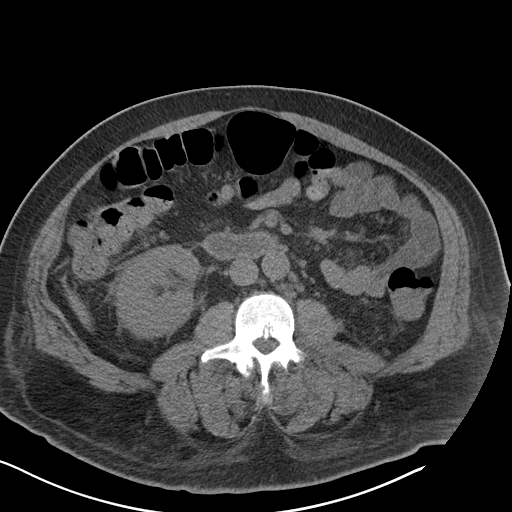
[im 65/100  soft-tissue]
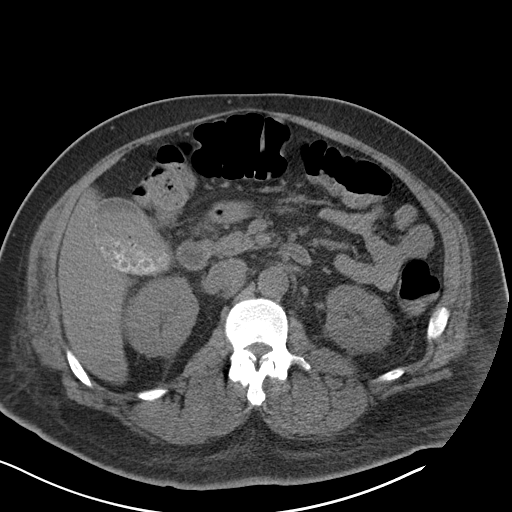
[im 65/100  bone]
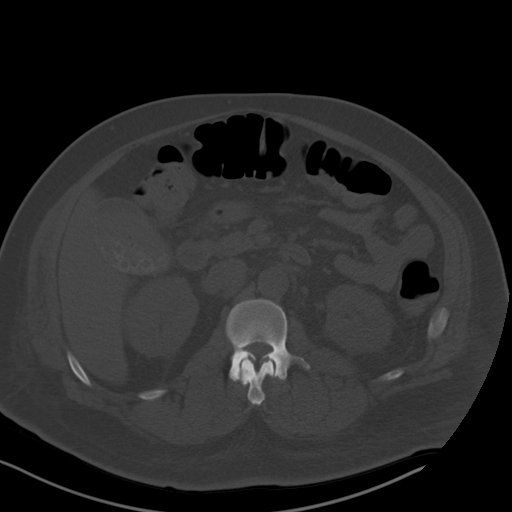
[im 74/100  soft-tissue]
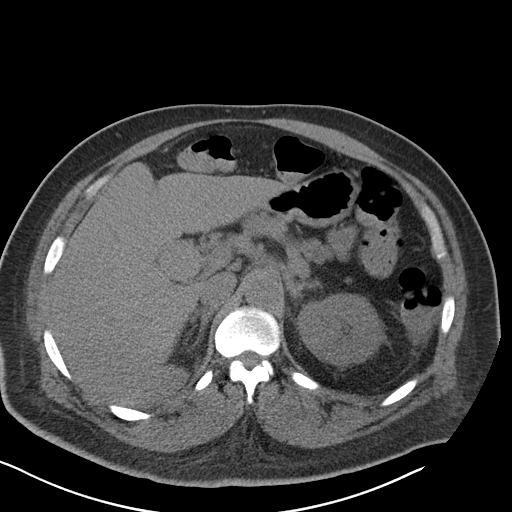
[im 78/100  soft-tissue]
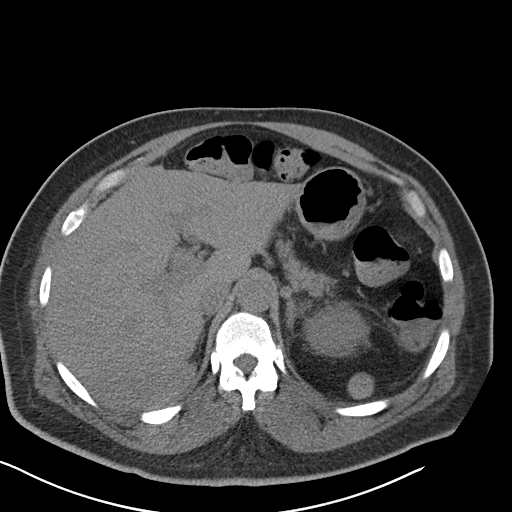
[im 87/100  soft-tissue]
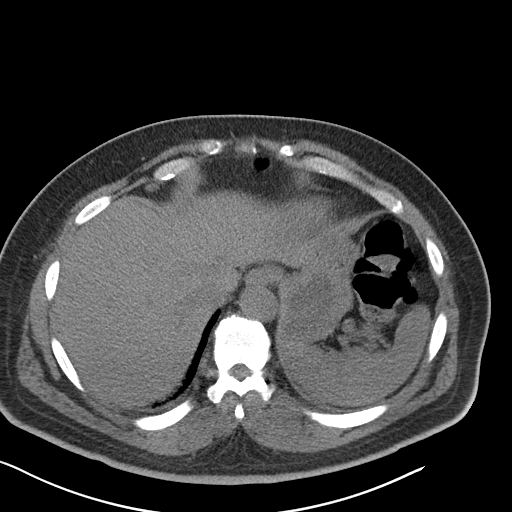
[im 95/100  soft-tissue]
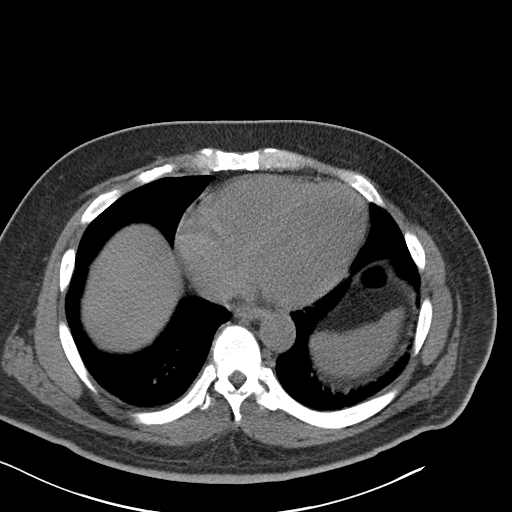

[Series 4: coronal · coronal · 0.83mm/px · 3 of 170 slices shown]
[im 57/170  soft-tissue]
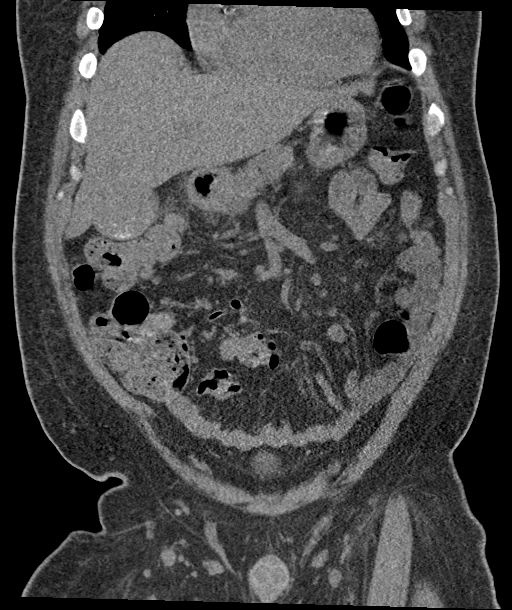
[im 76/170  soft-tissue]
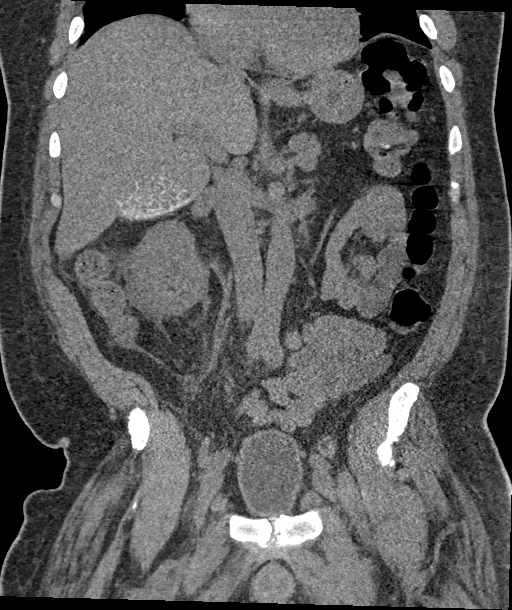
[im 94/170  soft-tissue]
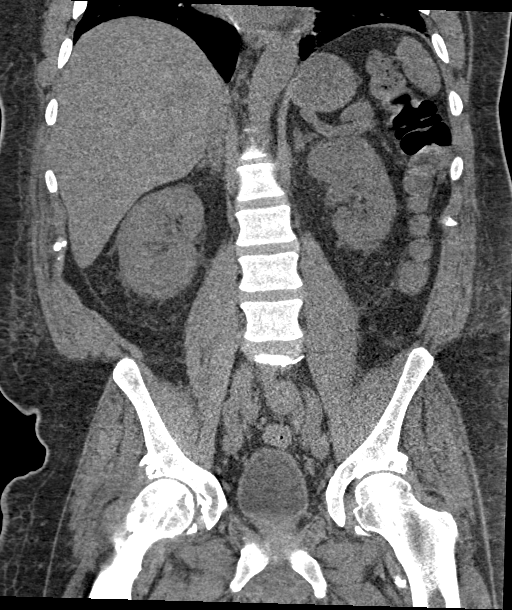

[16 of 46 positions shown; findings below may reference images not displayed]

FINDINGS: Lower chest: Atherosclerotic calcifications in the right coronary
artery.

Hepatobiliary: No discrete cystic or solid hepatic lesions
confidently identified on today's noncontrast CT examination.
Numerous partially calcified gallstones lie dependently in the
gallbladder. Gallbladder is moderately distended. No definite
pericholecystic fluid or inflammatory changes are confidently
identified.

Pancreas: No definite pancreatic mass or peripancreatic fluid
collections or inflammatory changes noted on today's noncontrast CT
examination.

Spleen: Small splenule inferior to the spleen. Otherwise,
unremarkable.

Adrenals/Urinary Tract: There are no abnormal calcifications within
the collecting system of either kidney, along the course of either
ureter, or within the lumen of the urinary bladder. No
hydroureteronephrosis to suggest urinary tract obstruction at this
time. Bilateral perinephric stranding is noted, but nonspecific. The
unenhanced appearance of the kidneys is unremarkable bilaterally.
Unenhanced appearance of the urinary bladder is normal. Bilateral
adrenal glands are normal in appearance.

Stomach/Bowel: Unenhanced appearance of the stomach is normal. No
pathologic dilatation of small bowel or colon. Normal appendix.

Vascular/Lymphatic: Mild atherosclerotic calcifications in the
abdominal aorta. No lymphadenopathy noted in the abdomen or pelvis.

Reproductive: Prostate gland and seminal vesicles are unremarkable
in appearance.

Other: No significant volume of ascites.  No pneumoperitoneum.

Musculoskeletal: There are no aggressive appearing lytic or blastic
lesions noted in the visualized portions of the skeleton.
IMPRESSION: 1. No acute findings are noted in the abdomen or pelvis to account
for the patient's symptoms.
2. Cholelithiasis without definitive evidence to suggest an acute
cholecystitis at this time.
3. Aortic atherosclerosis.
4. Additional incidental findings, as above.

## 2023-06-05 IMAGING — US US ABDOMEN LIMITED
1 series · 14 of 25 positions shown · non-contrast
Comparison: None.

CLINICAL DATA: Right upper quadrant pain

EXAM:
ULTRASOUND ABDOMEN LIMITED RIGHT UPPER QUADRANT

[Series 1: us abdomen limited · 14 of 54 slices shown]
[im 1/54]
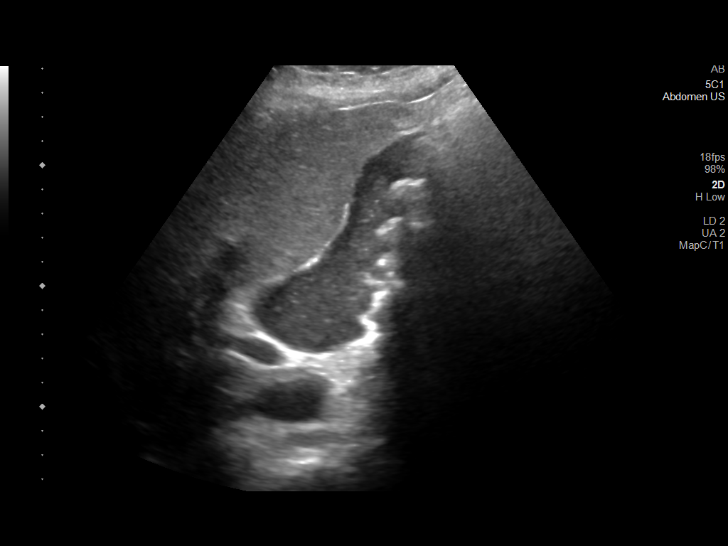
[im 5/54]
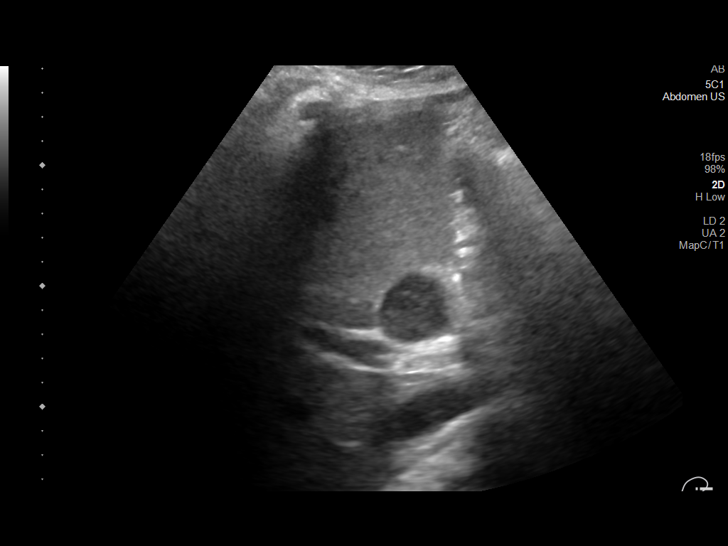
[im 9/54]
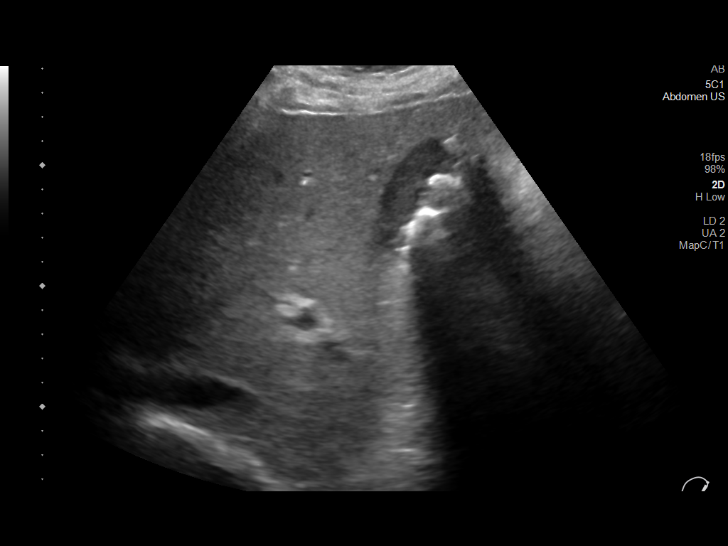
[im 14/54]
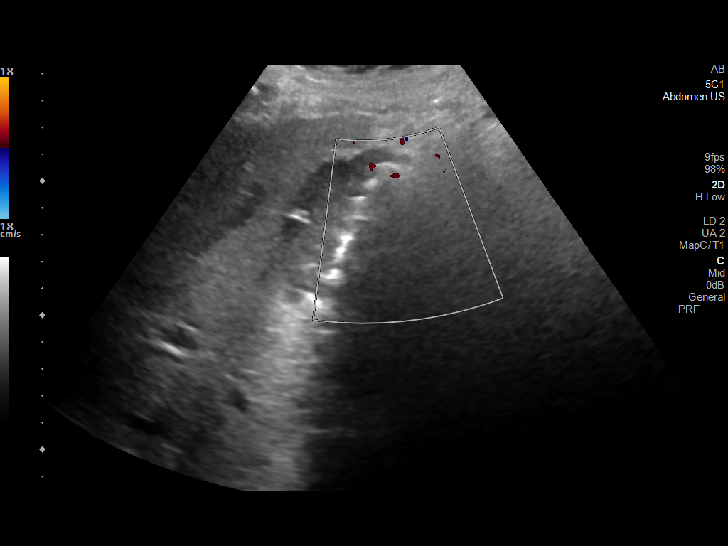
[im 18/54]
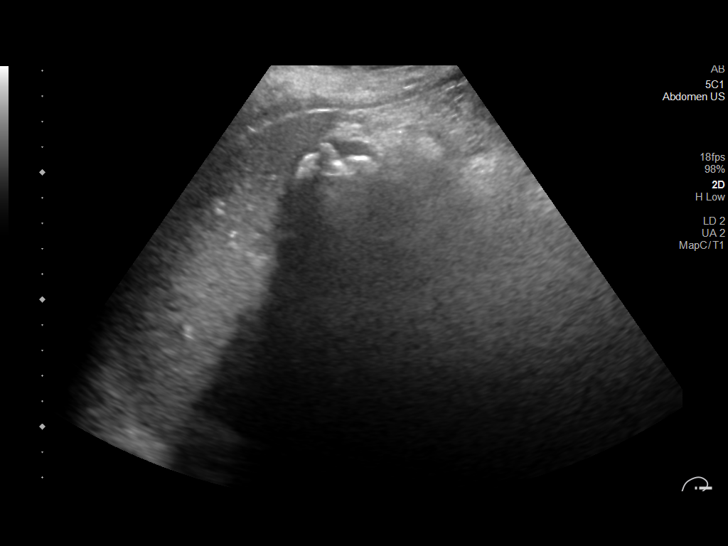
[im 20/54]
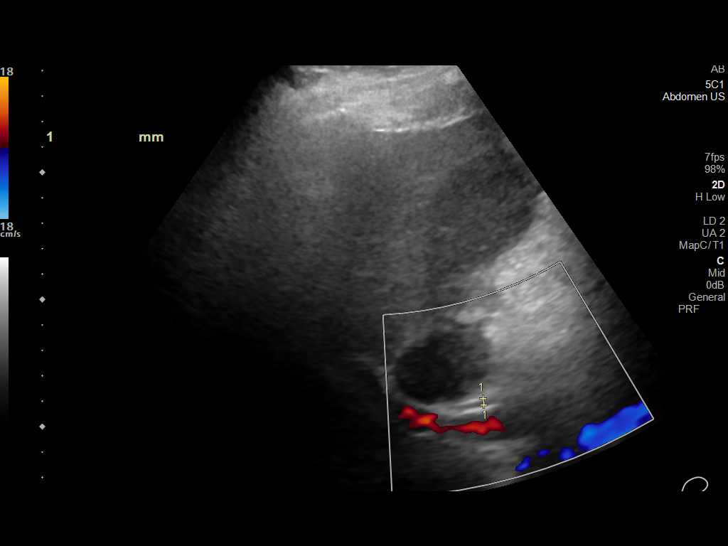
[im 25/54]
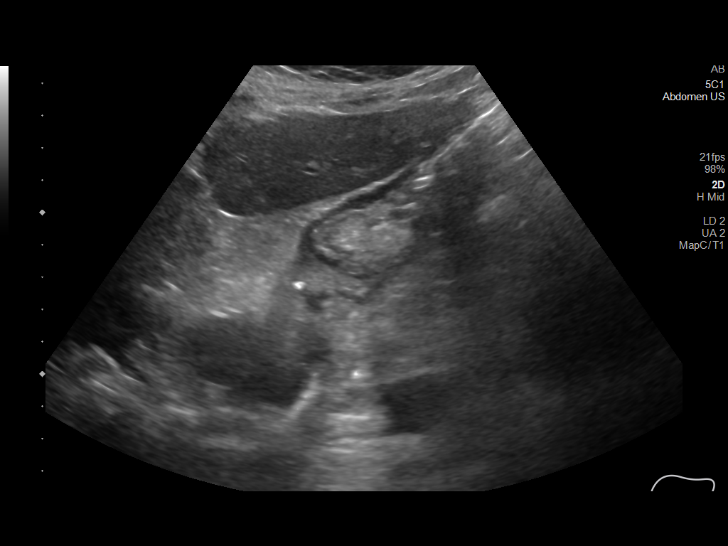
[im 29/54]
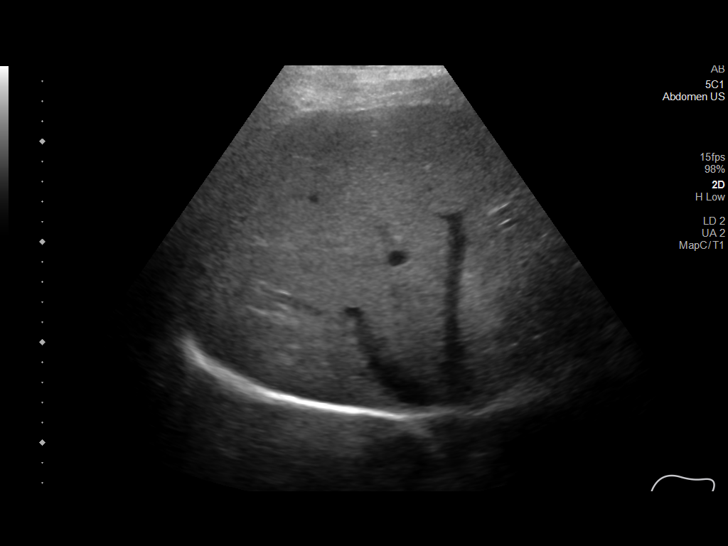
[im 34/54]
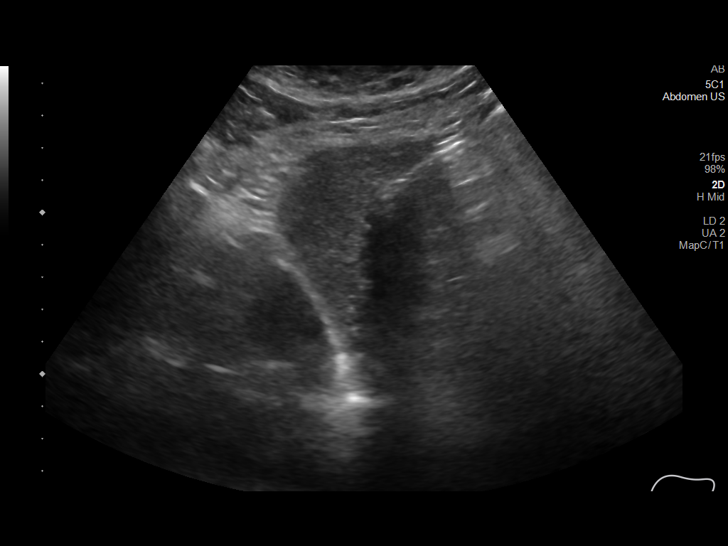
[im 36/54]
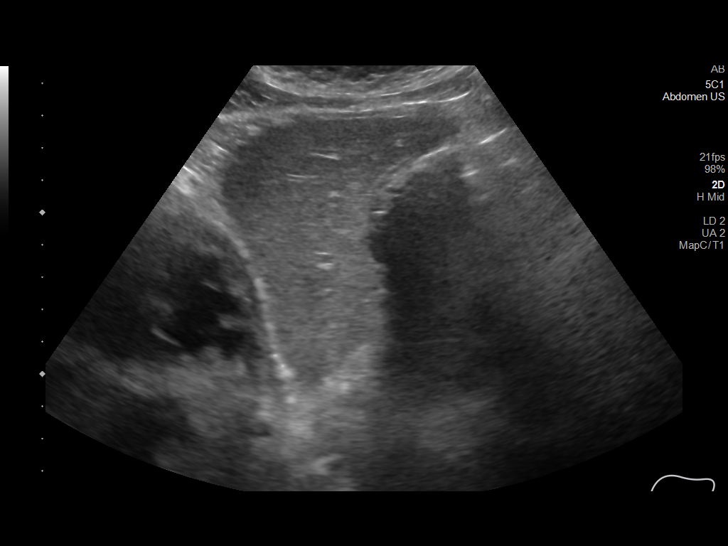
[im 40/54]
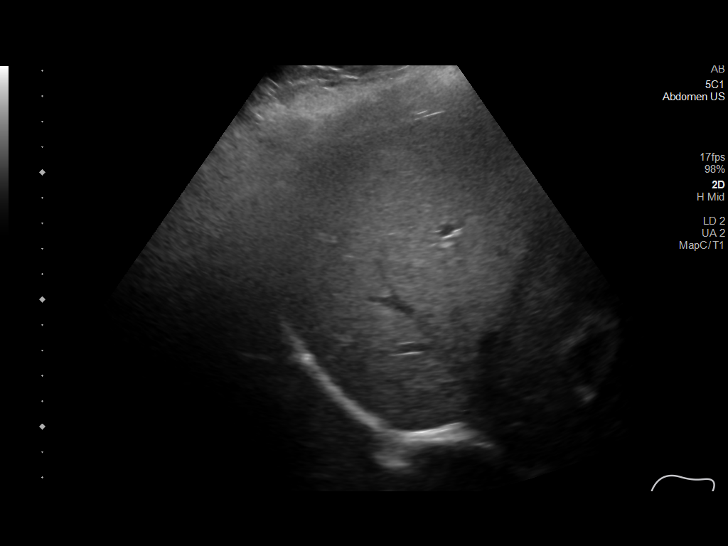
[im 45/54]
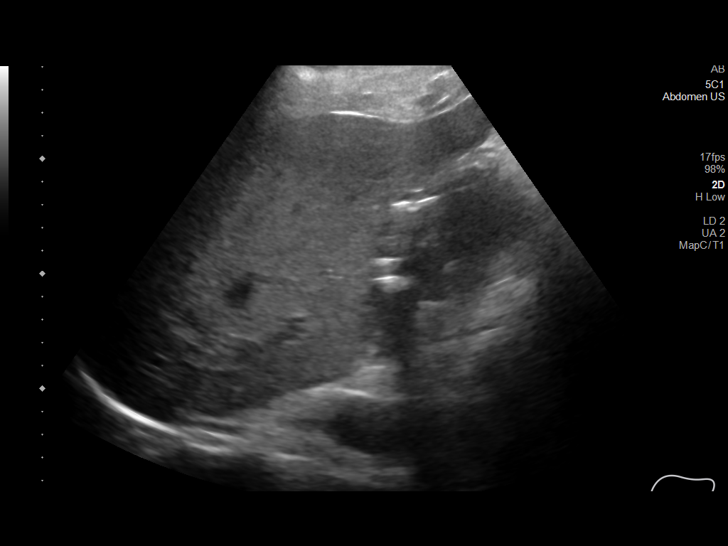
[im 49/54]
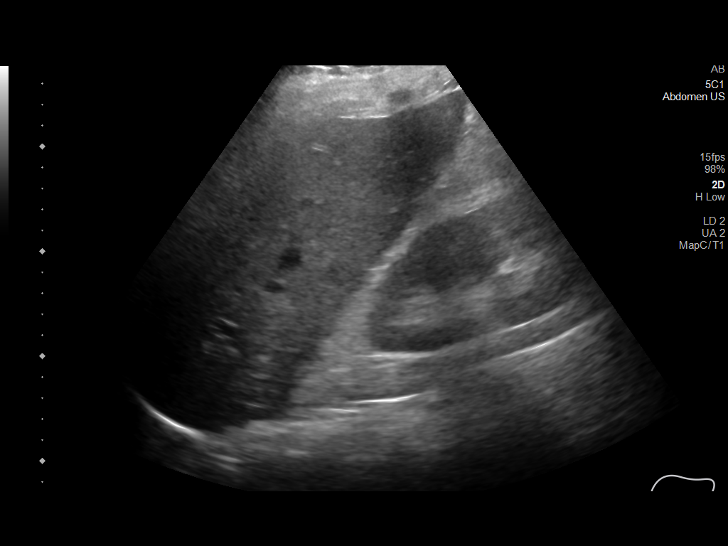
[im 54/54]
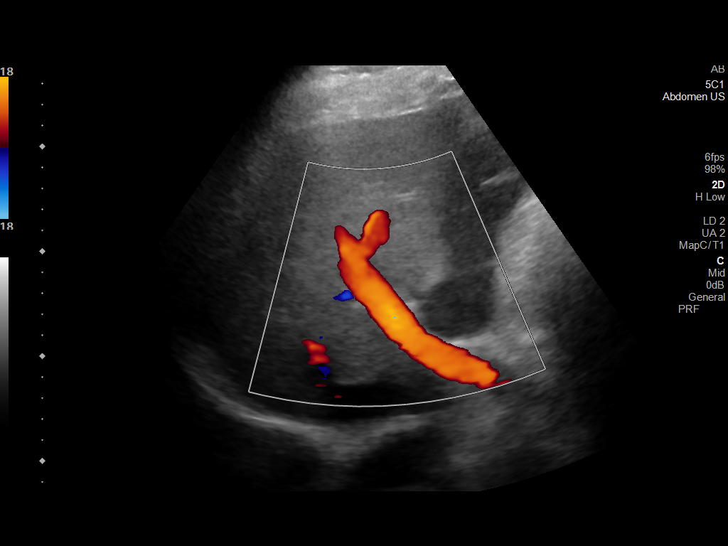

[14 of 25 positions shown; findings below may reference images not displayed]

FINDINGS: Gallbladder:

Multiple stones within the gallbladder measuring up to 1.7 cm. There
is sludge. Mild wall thickening at 3.6 mm. The patient was tender
over the gallbladder during the study.

Common bile duct:

Diameter: Normal caliber, 3 mm

Liver:

No focal lesion identified. Within normal limits in parenchymal
echogenicity. Portal vein is patent on color Doppler imaging with
normal direction of blood flow towards the liver.

Other: None.
IMPRESSION: Stones and sludge within the gallbladder. Mild gallbladder wall
thickening and tenderness over the gallbladder. Findings concerning
for acute cholecystitis.

## 2023-06-06 ENCOUNTER — Encounter (HOSPITAL_COMMUNITY)

## 2023-06-06 ENCOUNTER — Encounter: Admitting: Vascular Surgery

## 2023-06-06 IMAGING — DX DG FOOT 2V*L*
2 series · 2 of 2 positions shown · non-contrast
Comparison: Left foot radiograph 09/17/2020.
COMPARISON: Left foot radiograph 09/17/2020.

Addendum:
CLINICAL DATA: 54-year-old male with history of swelling in the
left foot and ulcer overlying the left heel. Evaluate for
osteomyelitis.

EXAM:
LEFT FOOT - 2 VIEW

[foot ap]
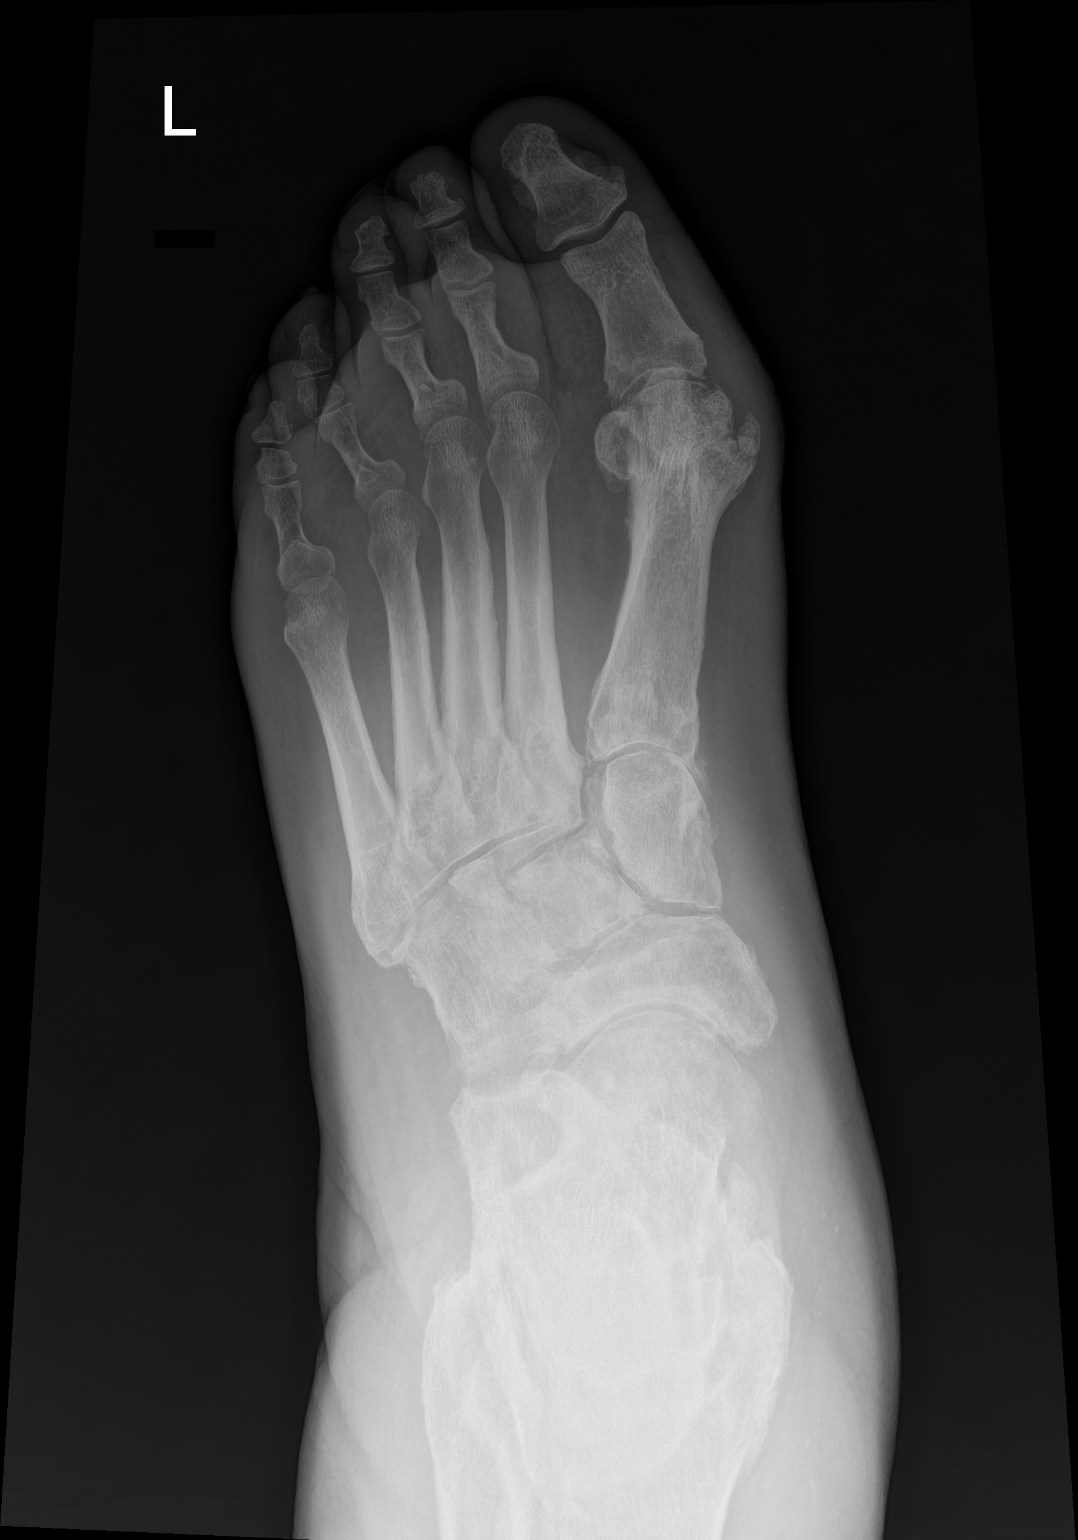

[foot lat]
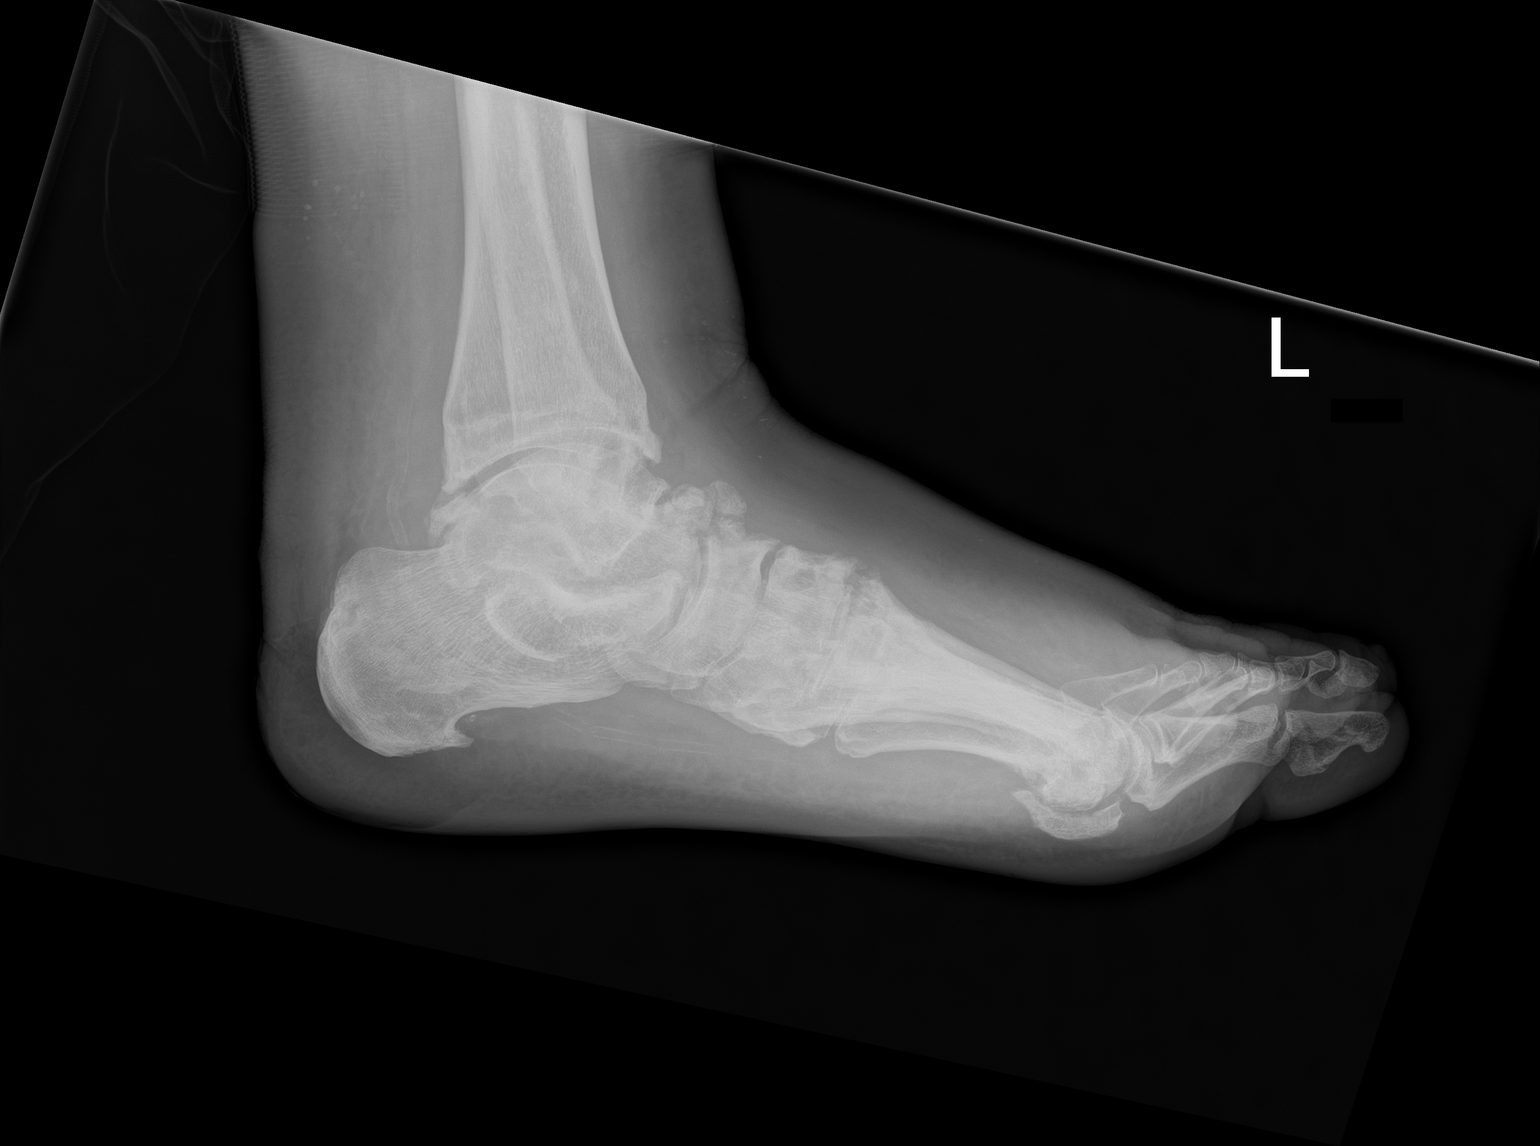

[2 of 2 positions shown; findings below may reference images not displayed]

FINDINGS: Two views of the left foot demonstrate irregular areas of sclerosis
and lucency in the calcaneus. Small plantar calcaneal spur. No acute
displaced fracture or dislocation. Multifocal joint space narrowing,
subchondral sclerosis, subchondral cyst formation and osteophyte
formation is noted in the midfoot and at the first MTP joint,
indicative of osteoarthritis. Pes planus.
IMPRESSION: 1. In addition to chronic degenerative changes in the foot, there
are irregular areas of sclerosis and lucency in the calcaneus. Given
the patient's history of heel ulcer, the possibility of chronic
calcaneal osteomyelitis warrants consideration and further
evaluation with left foot MRI with and without IV gadolinium is
recommended to better evaluate this finding.

ADDENDUM:
Comparison is made with prior left foot MRI dated 09/18/2020. This
MRI demonstrated no evidence of calcaneal osteomyelitis. The areas
of sclerosis and lucency in the calcaneus are therefore unrelated to
acute osteomyelitis.

*** End of Addendum ***
FINDINGS: Two views of the left foot demonstrate irregular areas of sclerosis
and lucency in the calcaneus. Small plantar calcaneal spur. No acute
displaced fracture or dislocation. Multifocal joint space narrowing,
subchondral sclerosis, subchondral cyst formation and osteophyte
formation is noted in the midfoot and at the first MTP joint,
indicative of osteoarthritis. Pes planus.
IMPRESSION: 1. In addition to chronic degenerative changes in the foot, there
are irregular areas of sclerosis and lucency in the calcaneus. Given
the patient's history of heel ulcer, the possibility of chronic
calcaneal osteomyelitis warrants consideration and further
evaluation with left foot MRI with and without IV gadolinium is
recommended to better evaluate this finding.

## 2023-06-28 ENCOUNTER — Other Ambulatory Visit: Payer: Self-pay | Admitting: *Deleted

## 2023-06-28 DIAGNOSIS — N186 End stage renal disease: Secondary | ICD-10-CM

## 2023-07-08 ENCOUNTER — Ambulatory Visit (HOSPITAL_COMMUNITY)
Admission: RE | Admit: 2023-07-08 | Discharge: 2023-07-08 | Disposition: A | Discharge status: Home / Self Care | Source: Ambulatory Visit | Attending: Surgery | Admitting: Surgery

## 2023-07-08 ENCOUNTER — Ambulatory Visit (HOSPITAL_BASED_OUTPATIENT_CLINIC_OR_DEPARTMENT_OTHER)
Admission: RE | Admit: 2023-07-08 | Discharge: 2023-07-08 | Disposition: A | Discharge status: Home / Self Care | Source: Ambulatory Visit | Attending: Surgery | Admitting: Surgery

## 2023-07-08 DIAGNOSIS — N186 End stage renal disease: Secondary | ICD-10-CM | POA: Diagnosis present

## 2023-07-15 NOTE — Progress Notes (Deleted)
 [facility] [place]  ASSESSMENT / PLAN: Patrick [name] is a 58 y.o. *** handed male in need of permanent dialysis access. I reviewed options for dialysis in detail with the patient, including hemodialysis and peritoneal dialysis. I counseled the patient to ask their nephrologist about their candidacy for renal transplant. I counseled the patient that dialysis access requires surveillance and periodic maintenance. Plan to proceed with ***.    CHIEF COMPLAINT: ***  HISTORY OF PRESENT ILLNESS: Patrick [name] is a 58 y.o. male ***  VASCULAR SURGICAL HISTORY: ***  VASCULAR RISK FACTORS: {FINDINGS; POSITIVE NEGATIVE:8153452288} history of stroke / transient ischemic attack. {FINDINGS; POSITIVE NEGATIVE:8153452288} history of coronary artery disease. *** history of PCI. *** history of CABG.  {FINDINGS; POSITIVE NEGATIVE:8153452288} history of diabetes mellitus. Last A1c ***. {FINDINGS; POSITIVE NEGATIVE:8153452288} history of smoking. *** actively smoking. {FINDINGS; POSITIVE NEGATIVE:8153452288} history of hypertension. *** drug regimen with *** control. {FINDINGS; POSITIVE NEGATIVE:8153452288} history of chronic kidney disease.  Last GFR ***. CKD {stage:30421363}. {FINDINGS; POSITIVE NEGATIVE:8153452288} history of chronic obstructive pulmonary disease, treated with ***.  FUNCTIONAL STATUS: ECOG performance status: {findings; ecog performance status:31780} Ambulatory status: {TNHAmbulation:25868}  CAREY 1 AND 3 YEAR INDEX Male (2pts) 75-79 or 80-84 (2pts) >84 (3pts) Dependence in toileting (1pt) Partial or full dependence in dressing (1pt) History of malignant neoplasm (2pts) CHF (3pts) COPD (1pts) CKD (3pts)  0-3 pts 6% 1 year mortality ; 21% 3 year mortality 4-5 pts 12% 1 year mortality ; 36% 3 year mortality >5 pts 21% 1 year mortality; 54% 3 year mortality   Past Medical History:  Diagnosis Date   ABLA (acute blood loss anemia) 09/01/2020   Acute  respiratory disease due to COVID-19 virus 05/03/2019   Anemia    Cholelithiasis with acute cholecystitis 09/25/2020   Diabetes mellitus    Diabetic foot infection (HCC) 06/04/2020   Diabetic foot ulcer (HCC) 06/04/2020   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   Gout    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/20/2020   History of COVID-19 04/2019   Hypertension    Hypertensive urgency 09/25/2020   Hypoglycemia 01/03/2022   Osteomyelitis of right foot (HCC)    PVD (peripheral vascular disease) (HCC)    s/p R BKA   Right arm cellulitis 01/09/2022   Sepsis due to cellulitis (HCC) 06/04/2020   Subacute osteomyelitis, left ankle and foot [facility])     Past Surgical History:  Procedure Laterality Date   AMPUTATION Right 06/06/2020   Procedure: AMPUTATION 5TH RAY;  Surgeon: Timothy Ford, MD;  Location: [facility] OR;  Service: Orthopedics;  Laterality: Right;   AMPUTATION Right 06/08/2020   Procedure: RIGHT 4TH RAY AMPUTATION;  Surgeon: Timothy Ford, MD;  Location: [facility] OR;  Service: Orthopedics;  Laterality: Right;   AMPUTATION Right 08/27/2020   Procedure: AMPUTATION BELOW KNEE;  Surgeon: Alphonso Jean, MD;  Location: [facility] OR;  Service: Orthopedics;  Laterality: Right;   AMPUTATION Left 12/30/2020   Procedure: LEFT BELOW KNEE AMPUTATION;  Surgeon: Timothy Ford, MD;  Location: [facility] OR;  Service: Orthopedics;  Laterality: Left;   CHOLECYSTECTOMY N/A 09/25/2020   Procedure: LAPAROSCOPIC CHOLECYSTECTOMY;  Surgeon: Anda Bamberg, MD;  Location: [facility] OR;  Service: General;  Laterality: N/A;   INSERTION OF DIALYSIS CATHETER N/A 01/01/2023   Procedure: INSERTION OF TUNNELED  DIALYSIS CATHETER RIGHT INTERNAL JUGULAR VEIN;  Surgeon: Kayla Part, MD;  Location: [facility] OR;  Service: Vascular;  Laterality: N/A;  Family History  Problem Relation Age of Onset   Diabetes Neg Hx     Social History   Socioeconomic History   Marital status: Divorced    Spouse name: Not on file    Number of children: Not on file   Years of education: Not on file   Highest education level: Not on file  Occupational History   Occupation: logistics  Tobacco Use   Smoking status: Never    Passive exposure: Never   Smokeless tobacco: Never  Vaping Use   Vaping status: Never Used  Substance and Sexual Activity   Alcohol use: No   Drug use: No   Sexual activity: Not Currently  Other Topics Concern   Not on file  Social History Narrative   Not on file   Social Drivers of Health   Financial Resource Strain: Not on file  Food Insecurity: No Food Insecurity (12/29/2022)   Hunger Vital Sign    Worried About Running Out of Food in the Last Year: Never true    Ran Out of Food in the Last Year: Never true  Transportation Needs: No Transportation Needs (12/29/2022)   PRAPARE - Administrator, Civil Service (Medical): No    Lack of Transportation (Non-Medical): No  Physical Activity: Not on file  Stress: Not on file  Social Connections: Unknown (06/30/2021)   [facility]   Social Network    Social Network: Not on file  Intimate Partner Violence: Not At Risk (12/29/2022)   Humiliation, Afraid, Rape, and Kick questionnaire    Fear of Current or Ex-Partner: No    Emotionally Abused: No    Physically Abused: No    Sexually Abused: No    No Known Allergies  Current Outpatient Medications  Medication Sig Dispense Refill   amLODipine  (NORVASC ) 10 MG tablet Take 1 tablet (10 mg total) by mouth daily. 90 tablet 2   calcitRIOL  (ROCALTROL ) 0.5 MCG capsule Take 1 capsule (0.5 mcg total) by mouth daily. 30 capsule 1   calcium  carbonate (TUMS - DOSED IN MG ELEMENTAL CALCIUM ) 500 MG chewable tablet Chew 1 tablet (200 mg of elemental calcium  total) by mouth 3 (three) times daily. 90 tablet 1   colchicine  0.6 MG tablet Take 1 tablet (0.6 mg total) by mouth every 14 (fourteen) days. 30 tablet 3   hydrALAZINE  (APRESOLINE ) 25 MG tablet Take 1 tablet (25  mg total) by mouth every 8 (eight) hours. 90 tablet 1   insulin  aspart (NOVOLOG ) 100 UNIT/ML FlexPen Inject 4 Units into the skin 3 (three) times daily with meals. 15 mL 1   Insulin  Glargine (BASAGLAR  KWIKPEN) 100 UNIT/ML Inject 15 Units into the skin at bedtime. 15 mL 1   Insulin  Pen Needle 32G X 4 MM MISC Use with insulin  pens 100 each 0   metoprolol  tartrate (LOPRESSOR ) 25 MG tablet Take 1 tablet (25 mg total) by mouth 2 (two) times daily. 60 tablet 3   OZEMPIC, 1 MG/DOSE, 4 MG/3ML SOPN Inject 1 mg into the skin once a week.     simvastatin  (ZOCOR ) 10 MG tablet Take 1 tablet (10 mg total) by mouth at bedtime. 90 tablet 3   sodium bicarbonate  650 MG tablet Take 2 tablets (1,300 mg total) by mouth 2 (two) times daily. 60 tablet 1   torsemide  (DEMADEX ) 100 MG tablet Take 1 tablet (100 mg total) by mouth daily. 30 tablet 1   No current facility-administered medications for this visit.   Facility-Administered  Medications Ordered in Other Visits  Medication Dose Route Frequency Provider Last Rate Last Admin   ketamine  (KETALAR ) bolus via infusion 68.05 mg  0.5 mg/kg Intravenous Once Duda, Marcus V, MD        PHYSICAL EXAM There were no vitals filed for this visit.  Constitutional: *** appearing. *** distress. Appears *** nourished.  Neurologic: CN ***. *** focal findings. *** sensory loss. Psychiatric: *** Mood and affect symmetric and appropriate. Eyes: *** No icterus. No conjunctival pallor. Ears, nose, throat: *** mucous membranes moist. Midline trachea.  Cardiac: *** rate and rhythm.  Respiratory: *** unlabored. Abdominal: *** soft, non-tender, non-distended.  Peripheral vascular: *** Extremity: *** edema. *** cyanosis. *** pallor.  Skin: *** gangrene. *** ulceration.  Lymphatic: *** Stemmer's sign. *** palpable lymphadenopathy.    PERTINENT LABORATORY AND RADIOLOGIC DATA  Most recent CBC    Latest Ref Rng & Units 02/01/2023   11:47 PM 01/02/2023    5:10 AM 01/01/2023     5:09 AM  CBC  WBC 4.0 - 10.5 K/uL 11.9  16.1  20.2   Hemoglobin 13.0 - 17.0 g/dL 9.2  8.1  8.5   Hematocrit 39.0 - 52.0 % 30.1  25.7  27.0   Platelets 150 - 400 K/uL 277  419  446      Most recent CMP    Latest Ref Rng & Units 02/01/2023   11:47 PM 01/02/2023    5:10 AM 01/01/2023    5:12 AM  CMP  Glucose 70 - 99 mg/dL 409  811  914   BUN 6 - 20 mg/dL 55  69  76   Creatinine 0.61 - 1.24 mg/dL 7.82  9.56  2.13   Sodium 135 - 145 mmol/L 137  135  136   Potassium 3.5 - 5.1 mmol/L 3.8  5.1  5.7   Chloride 98 - 111 mmol/L 100  104  104   CO2 22 - 32 mmol/L 23  19  18    Calcium  8.9 - 10.3 mg/dL 6.9  6.3  6.5   Total Protein 6.5 - 8.1 g/dL 6.6     Total Bilirubin <1.2 mg/dL 1.5     Alkaline Phos 38 - 126 U/L 65     AST 15 - 41 U/L 15     ALT 0 - 44 U/L 11       Renal function CrCl cannot be calculated (Patient's most recent lab result is older than the maximum 21 days allowed.).  Hgb A1c MFr Bld (%)  Date Value  12/29/2022 6.8 (H)    LDL Cholesterol (Calc)  Date Value Ref Range Status  06/01/2021 115 (H) mg/dL (calc) Final    Comment:    Reference range: <100 . Desirable range <100 mg/dL for primary prevention;   <70 mg/dL for patients with CHD or diabetic patients  with > or = 2 CHD risk factors. Aaron Aas LDL-C is now calculated using the Martin-Hopkins  calculation, which is a validated novel method providing  better accuracy than the Friedewald equation in the  estimation of LDL-C.  Melinda Sprawls et al. Erroll Heard. 0865;784(69): 2061-2068  (http://education.[employer].com/faq/FAQ164)      Vascular Imaging: ***  Heber Little. Edgardo Goodwill, MD [facility] Phone Number: [phone] 07/15/2023 6:50 PM   Total time spent on preparing this encounter including chart review, data review, collecting history, examining the patient, and coordinating care: {tnhtimebilling:26202} {billinglist:27273}  Portions of this report may have been  transcribed using voice recognition software.  Every effort has been made to ensure accuracy; however, inadvertent computerized transcription errors may still be present.

## 2023-07-16 ENCOUNTER — Ambulatory Visit: Attending: Vascular Surgery | Admitting: Vascular Surgery

## 2023-08-08 ENCOUNTER — Ambulatory Visit: Attending: Vascular Surgery | Admitting: Vascular Surgery

## 2023-08-08 VITALS — BP 123/80 | HR 85 | Temp 98.7°F | Resp 18 | Ht 75.0 in | Wt 323.3 lb

## 2023-08-08 DIAGNOSIS — N186 End stage renal disease: Secondary | ICD-10-CM | POA: Diagnosis present

## 2023-08-08 NOTE — Progress Notes (Signed)
 Office Note     CC:  ESRD Requesting Provider:  Cristi Donalds, MD  HPI: Patrick [name] is a Right handed 58 y.o. ([birth year]) male with kidney disease who presents at the request of Cristi Donalds, MD for permanent HD access. The patient has had no prior access procedures. Per pt, previous tunneled lines have been placed in right. Current access is right IJ. Dialysis days are MWF.   On exam, Patrick [name] was doing well.  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  He recently moved back to [place].  Medical history includes bilateral lower extremity BKA.  He ambulates with a cane.  Currently doing well on dialysis Monday Wednesday Friday.   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/20/2020   History of COVID-19 04/2019   Hypertension    Hypertensive urgency 09/25/2020   Hypoglycemia 01/03/2022   Osteomyelitis of right foot (HCC)    PVD (peripheral vascular disease) (HCC)    s/p R BKA   Right arm cellulitis 01/09/2022   Sepsis due to cellulitis (HCC) 06/04/2020   Subacute osteomyelitis, left ankle and foot [facility])     Past Surgical History:  Procedure Laterality Date   AMPUTATION Right 06/06/2020   Procedure: AMPUTATION 5TH RAY;  Surgeon: Timothy Ford, MD;  Location: [facility] OR;  Service: Orthopedics;  Laterality: Right;   AMPUTATION Right 06/08/2020   Procedure: RIGHT 4TH RAY AMPUTATION;  Surgeon: Timothy Ford, MD;  Location: [facility] OR;  Service: Orthopedics;  Laterality: Right;   AMPUTATION Right 08/27/2020   Procedure: AMPUTATION BELOW KNEE;  Surgeon: Alphonso Jean, MD;   Location: [facility] OR;  Service: Orthopedics;  Laterality: Right;   AMPUTATION Left 12/30/2020   Procedure: LEFT BELOW KNEE AMPUTATION;  Surgeon: Timothy Ford, MD;  Location: [facility] OR;  Service: Orthopedics;  Laterality: Left;   CHOLECYSTECTOMY N/A 09/25/2020   Procedure: LAPAROSCOPIC CHOLECYSTECTOMY;  Surgeon: Anda Bamberg, MD;  Location: [facility] OR;  Service: General;  Laterality: N/A;   INSERTION OF DIALYSIS CATHETER N/A 01/01/2023   Procedure: INSERTION OF TUNNELED  DIALYSIS CATHETER RIGHT INTERNAL JUGULAR VEIN;  Surgeon: Kayla Part, MD;  Location: [facility] OR;  Service: Vascular;  Laterality: N/A;    Social History   Socioeconomic History   Marital status: Divorced    Spouse name: Not on file   Number of children: Not on file   Years of education: Not on file   Highest education level: Not on file  Occupational History   Occupation: logistics  Tobacco Use   Smoking status: Never    Passive exposure: Never   Smokeless tobacco: Never  Vaping Use   Vaping status: Never Used  Substance and Sexual Activity   Alcohol use: No   Drug use: No   Sexual activity: Not Currently  Other Topics Concern   Not on file  Social History Narrative   Not on file   Social Drivers of Health   Financial Resource Strain: Not on file  Food Insecurity: No Food Insecurity (12/29/2022)   Hunger Vital Sign  Worried About Programme researcher, broadcasting/film/video in the Last Year: Never true    Ran Out of Food in the Last Year: Never true  Transportation Needs: No Transportation Needs (12/29/2022)   PRAPARE - Administrator, Civil Service (Medical): No    Lack of Transportation (Non-Medical): No  Physical Activity: Not on file  Stress: Not on file  Social Connections: Unknown (06/30/2021)   [facility]   Social Network    Social Network: Not on file  Intimate Partner Violence: Not At Risk (12/29/2022)   Humiliation, Afraid, Rape, and Kick questionnaire    Fear of Current or Ex-Partner: No     Emotionally Abused: No    Physically Abused: No    Sexually Abused: No   Family History  Problem Relation Age of Onset   Diabetes Neg Hx     Current Outpatient Medications  Medication Sig Dispense Refill   calcitRIOL  (ROCALTROL ) 0.5 MCG capsule Take 1 capsule (0.5 mcg total) by mouth daily. 30 capsule 1   calcium  carbonate (TUMS - DOSED IN MG ELEMENTAL CALCIUM ) 500 MG chewable tablet Chew 1 tablet (200 mg of elemental calcium  total) by mouth 3 (three) times daily. 90 tablet 1   amLODipine  (NORVASC ) 10 MG tablet Take 1 tablet (10 mg total) by mouth daily. 90 tablet 2   colchicine  0.6 MG tablet Take 1 tablet (0.6 mg total) by mouth every 14 (fourteen) days. 30 tablet 3   hydrALAZINE  (APRESOLINE ) 25 MG tablet Take 1 tablet (25 mg total) by mouth every 8 (eight) hours. 90 tablet 1   insulin  aspart (NOVOLOG ) 100 UNIT/ML FlexPen Inject 4 Units into the skin 3 (three) times daily with meals. 15 mL 1   Insulin  Glargine (BASAGLAR  KWIKPEN) 100 UNIT/ML Inject 15 Units into the skin at bedtime. 15 mL 1   Insulin  Pen Needle 32G X 4 MM MISC Use with insulin  pens 100 each 0   metoprolol  tartrate (LOPRESSOR ) 25 MG tablet Take 1 tablet (25 mg total) by mouth 2 (two) times daily. 60 tablet 3   OZEMPIC, 1 MG/DOSE, 4 MG/3ML SOPN Inject 1 mg into the skin once a week.     simvastatin  (ZOCOR ) 10 MG tablet Take 1 tablet (10 mg total) by mouth at bedtime. 90 tablet 3   sodium bicarbonate  650 MG tablet Take 2 tablets (1,300 mg total) by mouth 2 (two) times daily. 60 tablet 1   torsemide  (DEMADEX ) 100 MG tablet Take 1 tablet (100 mg total) by mouth daily. 30 tablet 1   No current facility-administered medications for this visit.   Facility-Administered Medications Ordered in Other Visits  Medication Dose Route Frequency Provider Last Rate Last Admin   ketamine  (KETALAR ) bolus via infusion 68.05 mg  0.5 mg/kg Intravenous Once Duda, Marcus V, MD        No Known Allergies   REVIEW OF SYSTEMS:  [X]  denotes  positive finding, [ ]  denotes negative finding Cardiac  Comments:  Chest pain or chest pressure:    Shortness of breath upon exertion:    Short of breath when lying flat:    Irregular heart rhythm:        Vascular    Pain in calf, thigh, or hip brought on by ambulation:    Pain in feet at night that wakes you up from your sleep:     Blood clot in your veins:    Leg swelling:         Pulmonary    Oxygen  at home:    Productive cough:     Wheezing:         Neurologic    Sudden weakness in arms or legs:     Sudden numbness in arms or legs:     Sudden onset of difficulty speaking or slurred speech:    Temporary loss of vision in one eye:     Problems with dizziness:         Gastrointestinal    Blood in stool:     Vomited blood:         Genitourinary    Burning when urinating:     Blood in urine:        Psychiatric    Major depression:         Hematologic    Bleeding problems:    Problems with blood clotting too easily:        Skin    Rashes or ulcers:        Constitutional    Fever or chills:      PHYSICAL EXAMINATION:  Vitals:   08/08/23 0851  BP: 123/80  Pulse: 85  Resp: 18  Temp: 98.7 F (37.1 C)  TempSrc: Temporal  SpO2: 97%  Weight: (!) 323 lb 4.8 oz (146.6 kg)  Height: 6' 3 (1.905 m)    General:  WDWN in NAD; vital signs documented above Gait: Not observed HENT: WNL, normocephalic Pulmonary: normal non-labored breathing , without Rales, rhonchi,  wheezing Cardiac: regular HR,  Abdomen: soft, NT, no masses Skin: without rashes Vascular Exam/Pulses:  Right Left  Radial 2+ (normal) 2+ (normal)  Ulnar    Brachial 2+ (normal) 2+ (normal)               Extremities: without ischemic changes, without Gangrene , without cellulitis; without open wounds;  Musculoskeletal: no muscle wasting or atrophy  Neurologic: A&O X 3;  No focal weakness or paresthesias are detected Psychiatric:  The pt has Normal affect.   Non-Invasive Vascular Imaging:    +-----------------+-------------+----------+---------+  Right Cephalic   Diameter (cm)Depth (cm)Findings   +-----------------+-------------+----------+---------+  Shoulder            0.29                          +-----------------+-------------+----------+---------+  Prox upper arm       0.34                          +-----------------+-------------+----------+---------+  Mid upper arm        0.41                          +-----------------+-------------+----------+---------+  Dist upper arm       0.19               branching  +-----------------+-------------+----------+---------+  Antecubital fossa    0.25               branching  +-----------------+-------------+----------+---------+  Prox forearm         0.31                          +-----------------+-------------+----------+---------+  Mid forearm          0.07               branching  +-----------------+-------------+----------+---------+  Wrist  0.06                          +-----------------+-------------+----------+---------+   +-----------------+-------------+----------+---------+  Right Basilic    Diameter (cm)Depth (cm)Findings   +-----------------+-------------+----------+---------+  Prox upper arm       0.35                          +-----------------+-------------+----------+---------+  Mid upper arm        0.27               branching  +-----------------+-------------+----------+---------+  Dist upper arm       0.39               branching  +-----------------+-------------+----------+---------+  Antecubital fossa    0.39                          +-----------------+-------------+----------+---------+  Prox forearm         0.23                          +-----------------+-------------+----------+---------+  Mid forearm          0.20               branching  +-----------------+-------------+----------+---------+  Wrist                0.24                          +-----------------+-------------+----------+---------+   +-----------------+-------------+----------+---------+  Left Cephalic    Diameter (cm)Depth (cm)Findings   +-----------------+-------------+----------+---------+  Shoulder            0.22                          +-----------------+-------------+----------+---------+  Prox upper arm       0.53                          +-----------------+-------------+----------+---------+  Mid upper arm        0.22               branching  +-----------------+-------------+----------+---------+  Dist upper arm       0.26                          +-----------------+-------------+----------+---------+  Antecubital fossa    0.30               branching  +-----------------+-------------+----------+---------+  Prox forearm         0.17                          +-----------------+-------------+----------+---------+  Mid forearm          0.17               branching  +-----------------+-------------+----------+---------+  Wrist               0.19                          +-----------------+-------------+----------+---------+   +-----------------+-------------+----------+--------------+  Left Basilic     Diameter (cm)Depth (cm)   Findings     +-----------------+-------------+----------+--------------+  Shoulder  0.23                               +-----------------+-------------+----------+--------------+  Prox upper arm       0.20                               +-----------------+-------------+----------+--------------+  Mid upper arm        0.24                               +-----------------+-------------+----------+--------------+  Dist upper arm       0.15                 branching     +-----------------+-------------+----------+--------------+  Antecubital fossa                       not visualized   +-----------------+-------------+----------+--------------+  Prox forearm                            not visualized  +-----------------+-------------+----------+--------------+  Mid forearm                             not visualized  +-----------------+-------------+----------+--------------+  Wrist                                  not visualized  +-----------------+-------------+----------+--------------+     ASSESSMENT/PLAN:  Patrick [name] is a 58 y.o. male who presents with end stage renal disease  Based on vein mapping and examination, patient is a candidate for right arm fistula creation.  The cephalic vein appears a little small, and the basilic vein appears acceptable in size.  Plan will be for right arm block.  Should this dilate the cephalic vein, I would plan to use this, otherwise he is aware he will receive a brachiobasilic fistula that will require subsequent transposition.. I had an extensive discussion with this patient in regards to the nature of access surgery, including risk, benefits, and alternatives.   The patient is aware that the risks of access surgery include but are not limited to: bleeding, infection, steal syndrome, nerve damage, ischemic monomelic neuropathy, failure of access to mature, complications related to venous hypertension, and possible need for additional access procedures in the future.  I discussed with the patient the nature of the staged access procedure, specifically the need for a second operation to transpose the first stage fistula if it matures adequately.   The patient has agreed to proceed with the above procedure which will be scheduled at his convenience.  Kayla Part, MD [facility] [phone] Total time of patient care including pre-visit research, consultation, and documentation greater than 30 minutes

## 2023-08-15 ENCOUNTER — Encounter: Admitting: Vascular Surgery

## 2023-08-16 ENCOUNTER — Telehealth: Payer: Self-pay

## 2023-08-16 NOTE — Telephone Encounter (Addendum)
 I spoke to the pt and informed him that his secondary insurance is OON.  He doesn't understand why he can't just use Medicare A and B.  I did inform him that he may be responsible for any costs not covered by his insurance but he says he really needs this surgery and would like to proceed even if he has to pay.

## 2023-08-20 ENCOUNTER — Other Ambulatory Visit: Payer: Self-pay

## 2023-08-20 ENCOUNTER — Ambulatory Visit (HOSPITAL_COMMUNITY)
Admission: RE | Admit: 2023-08-20 | Discharge: 2023-08-20 | Disposition: A | Discharge status: Home / Self Care | Attending: Vascular Surgery | Admitting: Vascular Surgery

## 2023-08-20 ENCOUNTER — Encounter (HOSPITAL_COMMUNITY)
Admission: RE | Disposition: A | Discharge status: Home / Self Care | Payer: Self-pay | Source: Home / Self Care | Attending: Vascular Surgery

## 2023-08-20 DIAGNOSIS — N186 End stage renal disease: Secondary | ICD-10-CM

## 2023-08-20 SURGERY — A/V SHUNT INTERVENTION
Anesthesia: LOCAL | Laterality: Right

## 2023-08-20 NOTE — H&P (Signed)
 Intervention canceled today.  There is a miscommunication in the outpatient setting.  Patient needs a fistula creation, not fistulagram

## 2023-08-21 ENCOUNTER — Encounter (HOSPITAL_COMMUNITY): Payer: Self-pay | Admitting: Vascular Surgery

## 2023-08-21 ENCOUNTER — Other Ambulatory Visit: Payer: Self-pay

## 2023-08-21 NOTE — Progress Notes (Signed)
 PCP - [facility]  Cardiologist - none Nephrology - Dr Ephriam Stank  Chest x-ray - 02/01/23 EKG - 02/02/23 Stress Test - n/a ECHO - 12/30/22 Cardiac Cath - n/a  ICD Pacemaker/Loop - n/a  Sleep Study -  n/a  Diabetes Type 2, no meds  Aspirin  & Blood Thinner Instructions:  n/a  NPO   Anesthesia review: Yes  STOP now taking any Aspirin  (unless otherwise instructed by your surgeon), Aleve , Naproxen , Ibuprofen , Motrin , Advil , Goody's, BC's, all herbal medications, fish oil, and all vitamins.   Coronavirus Screening Do you have any of the following symptoms:  Cough yes/no: No Fever (>100.13F)  yes/no: No Runny nose yes/no: No Sore throat yes/no: No Difficulty breathing/shortness of breath  yes/no: No  Have you traveled in the last 14 days and where? yes/no: No  Patient verbalized understanding of instructions that were given via phone.

## 2023-08-22 ENCOUNTER — Encounter (HOSPITAL_COMMUNITY): Payer: Self-pay | Admitting: Physician Assistant

## 2023-08-26 ENCOUNTER — Telehealth: Payer: Self-pay

## 2023-08-26 NOTE — Telephone Encounter (Signed)
 Attempted call to inform patient needing cardiac clearance prior to surgery.  LVM for patient.  Informed Dr. Lanis of Anesthesia advising cards clearance / sent request for referral to [facility]. [facility].

## 2023-08-27 ENCOUNTER — Ambulatory Visit (HOSPITAL_COMMUNITY): Admission: RE | Admit: 2023-08-27 | Source: Home / Self Care | Admitting: Vascular Surgery

## 2023-08-27 HISTORY — DX: Chronic kidney disease, unspecified: N18.9

## 2023-08-27 HISTORY — DX: Pneumonia, unspecified organism: J18.9

## 2023-08-27 HISTORY — DX: Dependence on other enabling machines and devices: Z99.89

## 2023-08-27 SURGERY — ARTERIOVENOUS (AV) FISTULA CREATION
Anesthesia: Monitor Anesthesia Care | Laterality: Right

## 2023-08-28 NOTE — Progress Notes (Signed)
 Cardiology Clinic Note   Patient Name: Patrick [name] Date of Encounter: 08/30/2023  Primary Care Provider:  [facility] Primary Cardiologist:  Lonni LITTIE Nanas, MD  Patient Profile    Patrick [name] 58 year old male presents clinic today for preoperative cardiac evaluation for fistula placement.  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/20/2020   History of COVID-19 04/2019   Hypertension    no meds   Hypertensive urgency 09/25/2020   Hypoglycemia 01/03/2022   Osteomyelitis of right foot (HCC)    Pneumonia    x 1   PVD (peripheral vascular disease) (HCC)    s/p R BKA   Right arm cellulitis 01/09/2022   Sepsis due to cellulitis (HCC) 06/04/2020   Subacute osteomyelitis, left ankle and foot [facility])    Past Surgical History:  Procedure Laterality Date   AMPUTATION Right 06/06/2020   Procedure: AMPUTATION 5TH RAY;  Surgeon: Harden Jerona GAILS, MD;  Location: [facility] OR;  Service: Orthopedics;  Laterality: Right;   AMPUTATION Right 06/08/2020   Procedure: RIGHT 4TH RAY AMPUTATION;  Surgeon: Harden Jerona GAILS, MD;  Location: [facility] OR;  Service: Orthopedics;  Laterality: Right;   AMPUTATION Right 08/27/2020   Procedure: AMPUTATION BELOW KNEE;  Surgeon: Lucilla Lynwood BRAVO, MD;  Location: [facility] OR;  Service: Orthopedics;  Laterality: Right;   AMPUTATION Left 12/30/2020   Procedure: LEFT BELOW KNEE AMPUTATION;  Surgeon: Harden Jerona GAILS, MD;   Location: [facility] OR;  Service: Orthopedics;  Laterality: Left;   CHOLECYSTECTOMY N/A 09/25/2020   Procedure: LAPAROSCOPIC CHOLECYSTECTOMY;  Surgeon: Paola Dreama SAILOR, MD;  Location: [facility] OR;  Service: General;  Laterality: N/A;   INSERTION OF DIALYSIS CATHETER N/A 01/01/2023   Procedure: INSERTION OF TUNNELED  DIALYSIS CATHETER RIGHT INTERNAL JUGULAR VEIN;  Surgeon: Lanis Fonda BRAVO, MD;  Location: [facility] OR;  Service: Vascular;  Laterality: N/A;    Allergies  No Known Allergies  History of Present Illness    Patrick [name] has a PMH of uncontrolled hypertension, type 2 diabetes, CKD stage III, obesity, gout, anemia, right BKA, and hyperlipidemia.  Echocardiogram 12/30/2022 showed LVEF of 40-45%, mildly dilated left ventricular cavity, mild LVH, G1 DD, and no significant valvular abnormalities.  He denies cardiac history.  He denies history of heart attack or stroke.  He is currently on dialysis Monday Wednesday Friday and was referred to cardiology for preoperative cardiac evaluation for fistula creation.  He presents to the clinic today for evaluation and states he feels well today.  He has doing dialysis Monday Wednesday Friday.  He reports that he just completed a dialysis treatment.  We reviewed his previous cardiac history and upcoming surgery.  He remains physically active walking daily.  He is ready to get his surgery so that he may return to exercising at the gym.  His blood pressure  today is 116/74.  His EKG today shows sinus rhythm with first-degree AV block 98 bpm.  I will continue his current medication regimen, plan for repeat echocardiogram in 4 to 6 months and plan follow-up in 4 to 6 months as well.  Today he denies chest pain, shortness of breath, lower extremity edema, fatigue, palpitations, melena, hematuria, hemoptysis, diaphoresis, weakness, presyncope, syncope, orthopnea, and PND.    Home Medications    Prior to Admission medications   Medication Sig Start Date End Date Taking?  Authorizing Provider  amLODipine  (NORVASC ) 10 MG tablet Take 1 tablet (10 mg total) by mouth daily. Patient not taking: Reported on 08/20/2023 01/03/23 04/03/23  Akula, Vijaya, MD  calcitRIOL  (ROCALTROL ) 0.5 MCG capsule Take 1 capsule (0.5 mcg total) by mouth daily. Patient not taking: Reported on 08/20/2023 01/03/23   Akula, Vijaya, MD  calcium  carbonate (TUMS - DOSED IN MG ELEMENTAL CALCIUM ) 500 MG chewable tablet Chew 1 tablet (200 mg of elemental calcium  total) by mouth 3 (three) times daily. Patient not taking: Reported on 08/20/2023 01/03/23   Akula, Vijaya, MD  colchicine  0.6 MG tablet Take 1 tablet (0.6 mg total) by mouth every 14 (fourteen) days. Patient not taking: Reported on 08/20/2023 03/28/23   Harden Jerona GAILS, MD  hydrALAZINE  (APRESOLINE ) 25 MG tablet Take 1 tablet (25 mg total) by mouth every 8 (eight) hours. Patient not taking: Reported on 08/20/2023 01/03/23   Akula, Vijaya, MD  insulin  aspart (NOVOLOG ) 100 UNIT/ML FlexPen Inject 4 Units into the skin 3 (three) times daily with meals. Patient not taking: Reported on 08/20/2023 01/03/23   Akula, Vijaya, MD  Insulin  Glargine (BASAGLAR  [facility]) 100 UNIT/ML Inject 15 Units into the skin at bedtime. Patient not taking: Reported on 08/20/2023 01/03/23   Akula, Vijaya, MD  Insulin  Pen Needle 32G X 4 MM MISC Use with insulin  pens 01/03/23   Akula, Vijaya, MD  metoprolol  tartrate (LOPRESSOR ) 25 MG tablet Take 1 tablet (25 mg total) by mouth 2 (two) times daily. Patient not taking: Reported on 08/20/2023 01/03/23   Akula, Vijaya, MD  simvastatin  (ZOCOR ) 10 MG tablet Take 1 tablet (10 mg total) by mouth at bedtime. Patient not taking: Reported on 08/20/2023 01/02/23   Akula, Vijaya, MD  sodium bicarbonate  650 MG tablet Take 2 tablets (1,300 mg total) by mouth 2 (two) times daily. Patient not taking: Reported on 08/20/2023 01/03/23   Akula, Vijaya, MD  torsemide  (DEMADEX ) 100 MG tablet Take 1 tablet (100 mg total) by mouth daily. Patient not taking: Reported on  08/20/2023 01/03/23   Cherlyn Labella, MD    Family History    Family History  Problem Relation Age of Onset   Diabetes Neg Hx    He indicated that the status of his neg hx is unknown.  Social History    Social History   Socioeconomic History   Marital status: Divorced    Spouse name: Not on file   Number of children: Not on file   Years of education: Not on file   Highest education level: Not on file  Occupational History   Occupation: logistics  Tobacco Use   Smoking status: Never    Passive exposure: Never   Smokeless tobacco: Never  Vaping Use   Vaping status: Never Used  Substance and Sexual Activity   Alcohol use: No   Drug use: No   Sexual activity: Not Currently  Other Topics Concern   Not on file  Social History Narrative   Not on file  Social Drivers of Corporate investment banker Strain: Not on file  Food Insecurity: No Food Insecurity (12/29/2022)   Hunger Vital Sign    Worried About Running Out of Food in the Last Year: Never true    Ran Out of Food in the Last Year: Never true  Transportation Needs: No Transportation Needs (12/29/2022)   PRAPARE - Administrator, Civil Service (Medical): No    Lack of Transportation (Non-Medical): No  Physical Activity: Not on file  Stress: Not on file  Social Connections: Unknown (06/30/2021)   Received from [facility]   Social Network    Social Network: Not on file  Intimate Partner Violence: Not At Risk (12/29/2022)   Humiliation, Afraid, Rape, and Kick questionnaire    Fear of Current or Ex-Partner: No    Emotionally Abused: No    Physically Abused: No    Sexually Abused: No     Review of Systems    General:  No chills, fever, night sweats or weight changes.  Cardiovascular:  No chest pain, dyspnea on exertion, edema, orthopnea, palpitations, paroxysmal nocturnal dyspnea. Dermatological: No rash, lesions/masses Respiratory: No cough, dyspnea Urologic: No hematuria, dysuria Abdominal:   No  nausea, vomiting, diarrhea, bright red blood per rectum, melena, or hematemesis Neurologic:  No visual changes, wkns, changes in mental status. All other systems reviewed and are otherwise negative except as noted above.  Physical Exam    VS:  BP 116/74 (BP Location: Left Arm, Patient Position: Sitting, Cuff Size: Large)   Pulse 98   Ht 6' 3 (1.905 m)   Wt (!) 315 lb (142.9 kg)   BMI 39.37 kg/m  , BMI Body mass index is 39.37 kg/m. GEN: Well nourished, well developed, in no acute distress. HEENT: normal. Neck: Supple, no JVD, carotid bruits, or masses. Cardiac: RRR, no murmurs, rubs, or gallops. No clubbing, cyanosis, edema.  Radials/DP/PT 2+ and equal bilaterally.  Respiratory:  Respirations regular and unlabored, clear to auscultation bilaterally. GI: Soft, nontender, nondistended, BS + x 4. MS: no deformity or atrophy.Bilateral lower extremity amputee Skin: warm and dry, no rash. Neuro:  Strength and sensation are intact. Psych: Normal affect.  Accessory Clinical Findings    Recent Labs: 01/02/2023: Magnesium  1.4 02/01/2023: ALT 11; B Natriuretic Peptide 444.3; BUN 55; Creatinine, Ser 6.32; Hemoglobin 9.2; Platelets 277; Potassium 3.8; Sodium 137   Recent Lipid Panel    Component Value Date/Time   CHOL 186 06/01/2021 0000   TRIG 68 06/01/2021 0000   HDL 55 06/01/2021 0000   CHOLHDL 3.4 06/01/2021 0000   LDLCALC 115 (H) 06/01/2021 0000         ECG personally reviewed by me today- EKG Interpretation Date/Time:  Friday August 30 2023 11:18:08 EDT Ventricular Rate:  98 PR Interval:  216 QRS Duration:  96 QT Interval:  350 QTC Calculation: 446 R Axis:   -11  Text Interpretation: Sinus rhythm with 1st degree A-V block with Premature atrial complexes Abnormal QRS-T angle, consider primary T wave abnormality When compared with ECG of 02-Feb-2023 06:23, PREVIOUS ECG IS PRESENT Confirmed by Emelia Hazy [phone]) on 08/30/2023 11:27:07 AM   Echocardiogram  12/30/2022  IMPRESSIONS     1. Left ventricular ejection fraction, by estimation, is 40 to 45%. Left  ventricular ejection fraction by PLAX is 45 %. The left ventricle has  mildly decreased function. The left ventricle demonstrates regional wall  motion abnormalities (see scoring  diagram/findings for description). The left ventricular internal cavity  size  was mildly dilated. There is mild left ventricular hypertrophy. Left  ventricular diastolic parameters are consistent with Grade I diastolic  dysfunction (impaired relaxation).  Elevated left ventricular end-diastolic pressure. There is severe  hypokinesis of the left ventricular, basal-mid inferior wall and  inferoseptal wall.   2. Right ventricular systolic function is mildly reduced. The right  ventricular size is normal. Tricuspid regurgitation signal is inadequate  for assessing PA pressure.   3. The mitral valve is grossly normal. Trivial mitral valve  regurgitation.   4. The aortic valve is tricuspid. Aortic valve regurgitation is not  visualized.   5. Aortic dilatation noted. 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   6. The inferior vena cava is normal in size with greater than 50%  respiratory variability, suggesting right atrial pressure of 3 mmHg.   Comparison(s): No prior Echocardiogram.   FINDINGS   Left Ventricle: Left ventricular ejection fraction, by estimation, is 40  to 45%. Left ventricular ejection fraction by PLAX is 45 %. The left  ventricle has mildly decreased function. The left ventricle demonstrates  regional wall motion abnormalities.  Severe hypokinesis of the left ventricular, basal-mid inferior wall and  inferoseptal wall. The left ventricular internal cavity size was mildly  dilated. There is mild left ventricular hypertrophy. Left ventricular  diastolic parameters are consistent with   Grade I diastolic dysfunction (impaired relaxation). Elevated left  ventricular  end-diastolic pressure.   Right Ventricle: The right ventricular size is normal. No increase in  right ventricular wall thickness. Right ventricular systolic function is  mildly reduced. Tricuspid regurgitation signal is inadequate for assessing  PA pressure.   Left Atrium: Left atrial size was normal in size.   Right Atrium: Right atrial size was normal in size.   Pericardium: There is no evidence of pericardial effusion.   Mitral Valve: The mitral valve is grossly normal. Trivial mitral valve  regurgitation.   Tricuspid Valve: The tricuspid valve is grossly normal. Tricuspid valve  regurgitation is trivial.   Aortic Valve: The aortic valve is tricuspid. Aortic valve regurgitation is  not visualized. Aortic valve mean gradient measures 3.0 mmHg. Aortic valve  peak gradient measures 5.1 mmHg. Aortic valve area, by VTI measures 4.07  cm.   Pulmonic Valve: The pulmonic valve was normal in structure. Pulmonic valve  regurgitation is not visualized.   Aorta: Aortic dilatation noted. 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   Venous: The inferior vena cava is normal in size with greater than 50%  respiratory variability, suggesting right atrial pressure of 3 mmHg.   IAS/Shunts: No atrial level shunt detected by color flow Doppler.       Assessment & Plan   1.Essential hypertension-BP today 116/74. Maintain blood pressure log Heart healthy low-sodium diet Continue amlodipine , hydralazine , metoprolol   Hyperlipidemia-LDL 115 on 06/01/21. 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  Echocardiogram showed mildly reduced EF of 40-45%.  (11/24).  Fluid volume managed by HD.  Not a candidate for ACE ARB Arni due to renal function. Active and with no complaints today. Heart healthy low-sodium diet Increase physical activity as tolerated Continue  metoprolol , hydralazine  Continue HD Plan to repeat echocardiogram at follow up   Preoperative cardiac evaluation-arteriovenous fistula creation, Dr. Fonda Rim   Primary Cardiologist: Lonni LITTIE Nanas, MD  Chart reviewed as part of pre-operative protocol coverage. Given past medical history and time  since last visit, based on ACC/AHA guidelines, CAIDENCE HIGASHI would be at acceptable risk for the planned procedure without further cardiovascular testing.   His RCRI is high risk, greater than 11% risk of major cardiac event.  He is able to complete greater than 4 METS of physical activity.  Patient was advised that if he develops new symptoms prior to surgery to contact our office to arrange a follow-up appointment.  He verbalized understanding.  He is currently not on any anticoagulation or antiplatelet therapy.  I will route this recommendation to the requesting party via Epic fax function and remove from pre-op  pool.   Disposition: Follow-up with Dr. Kate or me in 4-6 months.       Josefa HERO. Elhadj Girton NP-C     08/30/2023, 11:27 AM [facility] [address] Office [phone] Fax [phone]    I spent 14 minutes examining this patient, reviewing medications, and using patient centered shared decision making involving their cardiac care.   I spent  20 minutes reviewing past medical history,  medications, and prior cardiac tests.

## 2023-08-29 ENCOUNTER — Ambulatory Visit: Admitting: Orthopedic Surgery

## 2023-08-30 ENCOUNTER — Ambulatory Visit: Attending: General Practice | Admitting: General Practice

## 2023-08-30 ENCOUNTER — Encounter: Payer: Self-pay | Admitting: General Practice

## 2023-08-30 VITALS — BP 116/74 | HR 98 | Ht 75.0 in | Wt 315.0 lb

## 2023-08-30 DIAGNOSIS — E7800 Pure hypercholesterolemia, unspecified: Secondary | ICD-10-CM | POA: Insufficient documentation

## 2023-08-30 DIAGNOSIS — I5021 Acute systolic (congestive) heart failure: Secondary | ICD-10-CM | POA: Insufficient documentation

## 2023-08-30 DIAGNOSIS — I10 Essential (primary) hypertension: Secondary | ICD-10-CM | POA: Insufficient documentation

## 2023-08-30 DIAGNOSIS — Z01810 Encounter for preprocedural cardiovascular examination: Secondary | ICD-10-CM | POA: Insufficient documentation

## 2023-08-30 NOTE — Patient Instructions (Signed)
 Medication Instructions:  Your physician recommends that you continue on your current medications as directed. Please refer to the Current Medication list given to you today.  *If you need a refill on your cardiac medications before your next appointment, please call your pharmacy*  Lab Work: None ordered  If you have labs (blood work) drawn today and your tests are completely normal, you will receive your results only by: MyChart Message (if you have MyChart) OR A paper copy in the mail If you have any lab test that is abnormal or we need to change your treatment, we will call you to review the results.  Testing/Procedures: None ordered  Follow-Up: At [facility], you and your health needs are our priority.  As part of our continuing mission to provide you with exceptional heart care, our providers are all part of one team.  This team includes your primary Cardiologist (physician) and Advanced Practice Providers or APPs (Physician Assistants and Nurse Practitioners) who all work together to provide you with the care you need, when you need it.  Your next appointment:   4-6  month(s)  Provider:   Lonni LITTIE Nanas, MD    We recommend signing up for the patient portal called MyChart.  Sign up information is provided on this After Visit Summary.  MyChart is used to connect with patients for Virtual Visits (Telemedicine).  Patients are able to view lab/test results, encounter notes, upcoming appointments, etc.  Non-urgent messages can be sent to your provider as well.   To learn more about what you can do with MyChart, go to [email or web address].   Other Instructions

## 2023-09-03 ENCOUNTER — Other Ambulatory Visit: Payer: Self-pay

## 2023-09-03 DIAGNOSIS — N186 End stage renal disease: Secondary | ICD-10-CM

## 2023-09-06 ENCOUNTER — Other Ambulatory Visit: Payer: Self-pay

## 2023-09-06 ENCOUNTER — Encounter (HOSPITAL_COMMUNITY): Payer: Self-pay | Admitting: Vascular Surgery

## 2023-09-06 NOTE — Progress Notes (Signed)
 PCP - [facility] Cardiologist - Dr Lonni Nanas (clearance on 08/30/23)  Chest x-ray - 02/01/23 EKG - 08/30/23 Stress Test - n/a ECHO - 12/30/22 Cardiac Cath - n/a  ICD Pacemaker/Loop - n/a  Sleep Study -  n/a  Diabetes Type 2, no meds  Aspirin  & Blood Thinner Instructions:  n/a  NPO  Anesthesia review: Yes  STOP now taking any Aspirin  (unless otherwise instructed by your surgeon), Aleve , Naproxen , Ibuprofen , Motrin , Advil , Goody's, BC's, all herbal medications, fish oil, and all vitamins.   Coronavirus Screening Do you have any of the following symptoms:  Cough yes/no: No Fever (>100.29F)  yes/no: No Runny nose yes/no: No Sore throat yes/no: No Difficulty breathing/shortness of breath  yes/no: No  Have you traveled in the last 14 days and where? yes/no: No  Patient verbalized understanding of instructions that were given via phone.
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
## 2023-09-06 NOTE — Progress Notes (Signed)
 Anesthesia Chart Review: Same day workup  58 year old male follows with cardiology for history of HTN, HLD, HFmrEF. Echocardiogram 12/30/2022 showed LVEF of 40-45%, mildly dilated left ventricular cavity, mild LVH, G1 DD, and no significant valvular abnormalities.  Seen by Josefa Beauvais, NP on 08/30/2023 for preop evaluation.  Per note, Chart reviewed as part of pre-operative protocol coverage. Given past medical history and time since last visit, based on ACC/AHA guidelines, ZAYAN DELVECCHIO would be at acceptable risk for the planned procedure without further cardiovascular testing. His RCRI is high risk, greater than 11% risk of major cardiac event.  He is able to complete greater than 4 METS of physical activity. Patient was advised that if he develops new symptoms prior to surgery to contact our office to arrange a follow-up appointment.  He verbalized understanding.   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  Rate 98. Abnormal QRS-T angle, consider primary T wave abnormality  TTE 12/30/2022: 1. Left ventricular ejection fraction, by estimation, is 40 to 45%. Left  ventricular ejection fraction by PLAX is 45 %. The left ventricle has  mildly decreased function. The left ventricle demonstrates regional wall  motion abnormalities (see scoring  diagram/findings for description). The left ventricular internal cavity  size was mildly dilated. There is mild left ventricular hypertrophy. Left  ventricular diastolic parameters are consistent with Grade I diastolic  dysfunction (impaired relaxation).  Elevated left ventricular end-diastolic pressure. There is severe  hypokinesis of the left ventricular, basal-mid inferior wall and  inferoseptal wall.   2. Right ventricular systolic function is mildly reduced. The right   ventricular size is normal. Tricuspid regurgitation signal is inadequate  for assessing PA pressure.   3. The mitral valve is grossly normal. Trivial mitral valve  regurgitation.   4. The aortic valve is tricuspid. Aortic valve regurgitation is not  visualized.   5. Aortic dilatation noted. 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   6. The inferior vena cava is normal in size with greater than 50%  respiratory variability, suggesting right atrial pressure of 3 mmHg.     Lynwood Geofm RIGGERS [facility] Short Stay Center/Anesthesiology Phone [phone] 09/06/2023 11:18 AM

## 2023-09-10 ENCOUNTER — Inpatient Hospital Stay (HOSPITAL_COMMUNITY): Payer: Self-pay | Admitting: Physician Assistant

## 2023-09-10 ENCOUNTER — Other Ambulatory Visit: Payer: Self-pay

## 2023-09-10 ENCOUNTER — Ambulatory Visit (HOSPITAL_COMMUNITY): Payer: Self-pay | Admitting: Physician Assistant

## 2023-09-10 ENCOUNTER — Inpatient Hospital Stay (HOSPITAL_COMMUNITY)
Admission: RE | Admit: 2023-09-10 | Discharge: 2023-09-13 | DRG: 252 | Disposition: A | Discharge status: Home / Self Care | Attending: Internal Medicine | Admitting: Internal Medicine

## 2023-09-10 ENCOUNTER — Encounter (HOSPITAL_COMMUNITY)
Admission: RE | Disposition: A | Discharge status: Home / Self Care | Payer: Self-pay | Source: Home / Self Care | Attending: Vascular Surgery

## 2023-09-10 ENCOUNTER — Inpatient Hospital Stay (HOSPITAL_COMMUNITY)

## 2023-09-10 DIAGNOSIS — Z89512 Acquired absence of left leg below knee: Secondary | ICD-10-CM

## 2023-09-10 DIAGNOSIS — E66813 Obesity, class 3: Secondary | ICD-10-CM | POA: Diagnosis present

## 2023-09-10 DIAGNOSIS — Z8616 Personal history of COVID-19: Secondary | ICD-10-CM

## 2023-09-10 DIAGNOSIS — Z538 Procedure and treatment not carried out for other reasons: Secondary | ICD-10-CM

## 2023-09-10 DIAGNOSIS — Z992 Dependence on renal dialysis: Secondary | ICD-10-CM

## 2023-09-10 DIAGNOSIS — E1159 Type 2 diabetes mellitus with other circulatory complications: Secondary | ICD-10-CM

## 2023-09-10 DIAGNOSIS — I9788 Other intraoperative complications of the circulatory system, not elsewhere classified: Secondary | ICD-10-CM

## 2023-09-10 DIAGNOSIS — E8721 Acute metabolic acidosis: Secondary | ICD-10-CM | POA: Diagnosis not present

## 2023-09-10 DIAGNOSIS — Z79899 Other long term (current) drug therapy: Secondary | ICD-10-CM

## 2023-09-10 DIAGNOSIS — D631 Anemia in chronic kidney disease: Secondary | ICD-10-CM | POA: Diagnosis present

## 2023-09-10 DIAGNOSIS — D72829 Elevated white blood cell count, unspecified: Secondary | ICD-10-CM | POA: Diagnosis not present

## 2023-09-10 DIAGNOSIS — I132 Hypertensive heart and chronic kidney disease with heart failure and with stage 5 chronic kidney disease, or end stage renal disease: Secondary | ICD-10-CM | POA: Diagnosis present

## 2023-09-10 DIAGNOSIS — I5042 Chronic combined systolic (congestive) and diastolic (congestive) heart failure: Secondary | ICD-10-CM | POA: Diagnosis present

## 2023-09-10 DIAGNOSIS — E7800 Pure hypercholesterolemia, unspecified: Secondary | ICD-10-CM | POA: Diagnosis not present

## 2023-09-10 DIAGNOSIS — E1122 Type 2 diabetes mellitus with diabetic chronic kidney disease: Secondary | ICD-10-CM | POA: Diagnosis present

## 2023-09-10 DIAGNOSIS — J9601 Acute respiratory failure with hypoxia: Secondary | ICD-10-CM | POA: Diagnosis not present

## 2023-09-10 DIAGNOSIS — I9589 Other hypotension: Secondary | ICD-10-CM

## 2023-09-10 DIAGNOSIS — Z9889 Other specified postprocedural states: Principal | ICD-10-CM | POA: Diagnosis present

## 2023-09-10 DIAGNOSIS — N186 End stage renal disease: Secondary | ICD-10-CM | POA: Diagnosis not present

## 2023-09-10 DIAGNOSIS — Z5309 Procedure and treatment not carried out because of other contraindication: Secondary | ICD-10-CM | POA: Diagnosis not present

## 2023-09-10 DIAGNOSIS — E871 Hypo-osmolality and hyponatremia: Secondary | ICD-10-CM | POA: Diagnosis present

## 2023-09-10 DIAGNOSIS — I952 Hypotension due to drugs: Secondary | ICD-10-CM

## 2023-09-10 DIAGNOSIS — E1151 Type 2 diabetes mellitus with diabetic peripheral angiopathy without gangrene: Secondary | ICD-10-CM | POA: Diagnosis present

## 2023-09-10 DIAGNOSIS — I120 Hypertensive chronic kidney disease with stage 5 chronic kidney disease or end stage renal disease: Secondary | ICD-10-CM

## 2023-09-10 DIAGNOSIS — I770 Arteriovenous fistula, acquired: Secondary | ICD-10-CM | POA: Diagnosis present

## 2023-09-10 DIAGNOSIS — E669 Obesity, unspecified: Principal | ICD-10-CM

## 2023-09-10 DIAGNOSIS — Z89511 Acquired absence of right leg below knee: Secondary | ICD-10-CM

## 2023-09-10 DIAGNOSIS — R9431 Abnormal electrocardiogram [ECG] [EKG]: Secondary | ICD-10-CM

## 2023-09-10 DIAGNOSIS — Z6839 Body mass index (BMI) 39.0-39.9, adult: Secondary | ICD-10-CM

## 2023-09-10 LAB — GLUCOSE, CAPILLARY
Glucose-Capillary: 124 mg/dL — ABNORMAL HIGH (ref 70–99)
Glucose-Capillary: 188 mg/dL — ABNORMAL HIGH (ref 70–99)
Glucose-Capillary: 189 mg/dL — ABNORMAL HIGH (ref 70–99)
Glucose-Capillary: 196 mg/dL — ABNORMAL HIGH (ref 70–99)
Glucose-Capillary: 192 mg/dL — ABNORMAL HIGH (ref 70–99)
Glucose-Capillary: 155 mg/dL — ABNORMAL HIGH (ref 70–99)
Glucose-Capillary: 180 mg/dL — ABNORMAL HIGH (ref 70–99)

## 2023-09-10 LAB — POCT I-STAT, CHEM 8
BUN: 34 mg/dL — ABNORMAL HIGH (ref 6–20)
Calcium, Ion: 1.16 mmol/L (ref 1.15–1.40)
Chloride: 104 mmol/L (ref 98–111)
Creatinine, Ser: 8.3 mg/dL — ABNORMAL HIGH (ref 0.61–1.24)
Glucose, Bld: 122 mg/dL — ABNORMAL HIGH (ref 70–99)
HCT: 35 % — ABNORMAL LOW (ref 39.0–52.0)
Hemoglobin: 11.9 g/dL — ABNORMAL LOW (ref 13.0–17.0)
Potassium: 4.7 mmol/L (ref 3.5–5.1)
Sodium: 137 mmol/L (ref 135–145)
TCO2: 24 mmol/L (ref 22–32)

## 2023-09-10 LAB — POCT I-STAT 7, (LYTES, BLD GAS, ICA,H+H)
Acid-base deficit: 4 mmol/L — ABNORMAL HIGH (ref 0.0–2.0)
Bicarbonate: 22.9 mmol/L (ref 20.0–28.0)
Calcium, Ion: 1.17 mmol/L (ref 1.15–1.40)
HCT: 32 % — ABNORMAL LOW (ref 39.0–52.0)
Hemoglobin: 10.9 g/dL — ABNORMAL LOW (ref 13.0–17.0)
O2 Saturation: 100 %
Patient temperature: 98.2
Potassium: 4.5 mmol/L (ref 3.5–5.1)
Sodium: 136 mmol/L (ref 135–145)
TCO2: 24 mmol/L (ref 22–32)
pCO2 arterial: 48.3 mmHg — ABNORMAL HIGH (ref 32–48)
pH, Arterial: 7.283 — ABNORMAL LOW (ref 7.35–7.45)
pO2, Arterial: 300 mmHg — ABNORMAL HIGH (ref 83–108)

## 2023-09-10 SURGERY — ARTERIOVENOUS (AV) FISTULA CREATION
Anesthesia: General | Site: Arm Upper | Laterality: Right

## 2023-09-10 MED ORDER — EPINEPHRINE PF 1 MG/ML IJ SOLN: INTRAMUSCULAR | Status: DC | PRN

## 2023-09-10 MED ORDER — LABETALOL HCL 5 MG/ML IV SOLN
10.0000 mg | INTRAVENOUS | Status: DC | PRN
  Administered 2023-09-10 (×2): 10 mg via INTRAVENOUS
  Filled 2023-09-10 (×2): qty 4

## 2023-09-10 MED ORDER — DEXAMETHASONE SODIUM PHOSPHATE 10 MG/ML IJ SOLN
INTRAMUSCULAR | Status: AC
  Filled 2023-09-10: qty 1

## 2023-09-10 MED ORDER — PROPOFOL 10 MG/ML IV BOLUS
INTRAVENOUS | Status: AC
  Filled 2023-09-10: qty 20

## 2023-09-10 MED ORDER — ISOSORBIDE MONONITRATE ER 30 MG PO TB24
60.0000 mg | ORAL_TABLET | Freq: Every day | ORAL | Status: DC
  Administered 2023-09-10 – 2023-09-11 (×2): 60 mg via ORAL
  Filled 2023-09-10 (×2): qty 2

## 2023-09-10 MED ORDER — FENTANYL 2500MCG IN NS 250ML (10MCG/ML) PREMIX INFUSION
INTRAVENOUS | Status: AC
  Filled 2023-09-10: qty 250

## 2023-09-10 MED ORDER — LIDOCAINE HCL (PF) 1 % IJ SOLN
INTRAMUSCULAR | Status: AC
  Filled 2023-09-10: qty 30

## 2023-09-10 MED ORDER — PHENYLEPHRINE 80 MCG/ML (10ML) SYRINGE FOR IV PUSH (FOR BLOOD PRESSURE SUPPORT)
PREFILLED_SYRINGE | INTRAVENOUS | Status: AC
  Filled 2023-09-10: qty 10

## 2023-09-10 MED ORDER — FENTANYL CITRATE (PF) 250 MCG/5ML IJ SOLN
INTRAMUSCULAR | Status: AC
Start: 2023-09-10 — End: 2023-09-10
  Filled 2023-09-10: qty 5

## 2023-09-10 MED ORDER — MIDAZOLAM HCL 2 MG/2ML IJ SOLN
INTRAMUSCULAR | Status: DC | PRN
  Administered 2023-09-10 (×2): 1 mg via INTRAVENOUS

## 2023-09-10 MED ORDER — ROCURONIUM BROMIDE 100 MG/10ML IV SOLN
INTRAVENOUS | Status: DC | PRN
  Administered 2023-09-10: 60 mg via INTRAVENOUS

## 2023-09-10 MED ORDER — 0.9 % SODIUM CHLORIDE (POUR BTL) OPTIME
TOPICAL | Status: DC | PRN
  Administered 2023-09-10: 1000 mL

## 2023-09-10 MED ORDER — HEPARIN 6000 UNIT IRRIGATION SOLUTION
Status: AC
  Filled 2023-09-10: qty 500

## 2023-09-10 MED ORDER — CHLORHEXIDINE GLUCONATE 4 % EX SOLN: 60.0000 mL | Freq: Once | CUTANEOUS | Status: DC

## 2023-09-10 MED ORDER — ORAL CARE MOUTH RINSE: 15.0000 mL | OROMUCOSAL | Status: DC | PRN

## 2023-09-10 MED ORDER — VASOPRESSIN 20 UNIT/ML IV SOLN
INTRAVENOUS | Status: DC | PRN
  Administered 2023-09-10: 1 [IU] via INTRAVENOUS

## 2023-09-10 MED ORDER — PROPOFOL 1000 MG/100ML IV EMUL
0.0000 ug/kg/min | INTRAVENOUS | Status: DC
  Administered 2023-09-10: 75 ug/kg/min via INTRAVENOUS

## 2023-09-10 MED ORDER — ALBUMIN HUMAN 5 % IV SOLN: INTRAVENOUS | Status: DC | PRN

## 2023-09-10 MED ORDER — SODIUM CHLORIDE 0.9% FLUSH: 3.0000 mL | INTRAVENOUS | Status: DC | PRN

## 2023-09-10 MED ORDER — SODIUM CHLORIDE 0.9% FLUSH
10.0000 mL | Freq: Two times a day (BID) | INTRAVENOUS | Status: DC
  Administered 2023-09-10 – 2023-09-12 (×5): 10 mL

## 2023-09-10 MED ORDER — ORAL CARE MOUTH RINSE: 15.0000 mL | Freq: Once | OROMUCOSAL | Status: AC

## 2023-09-10 MED ORDER — LIDOCAINE 2% (20 MG/ML) 5 ML SYRINGE
INTRAMUSCULAR | Status: AC
Start: 2023-09-10 — End: 2023-09-10
  Filled 2023-09-10: qty 5

## 2023-09-10 MED ORDER — GLYCOPYRROLATE PF 0.2 MG/ML IJ SOSY
PREFILLED_SYRINGE | INTRAMUSCULAR | Status: DC | PRN
Start: 2023-09-10 — End: 2023-09-10
  Administered 2023-09-10: .2 mg via INTRAVENOUS

## 2023-09-10 MED ORDER — PROPOFOL 10 MG/ML IV BOLUS
INTRAVENOUS | Status: DC | PRN
  Administered 2023-09-10: 200 mg via INTRAVENOUS
  Administered 2023-09-10 (×3): 50 mg via INTRAVENOUS

## 2023-09-10 MED ORDER — ORAL CARE MOUTH RINSE
15.0000 mL | OROMUCOSAL | Status: DC
  Administered 2023-09-10 – 2023-09-12 (×7): 15 mL via OROMUCOSAL

## 2023-09-10 MED ORDER — HEPARIN 6000 UNIT IRRIGATION SOLUTION
Status: DC | PRN
  Administered 2023-09-10: 1

## 2023-09-10 MED ORDER — INSULIN ASPART 100 UNIT/ML IJ SOLN: 0.0000 [IU] | INTRAMUSCULAR | Status: DC | PRN

## 2023-09-10 MED ORDER — PHENYLEPHRINE HCL-NACL 20-0.9 MG/250ML-% IV SOLN
INTRAVENOUS | Status: DC | PRN
  Administered 2023-09-10: 30 ug/min via INTRAVENOUS

## 2023-09-10 MED ORDER — PHENOL 1.4 % MT LIQD
1.0000 | OROMUCOSAL | Status: DC | PRN
  Administered 2023-09-10: 1 via OROMUCOSAL
  Filled 2023-09-10 (×2): qty 177

## 2023-09-10 MED ORDER — ESMOLOL HCL 100 MG/10ML IV SOLN
INTRAVENOUS | Status: DC | PRN
  Administered 2023-09-10: 30 mg via INTRAVENOUS

## 2023-09-10 MED ORDER — SODIUM CHLORIDE 0.9 % IV SOLN: INTRAVENOUS | Status: DC | PRN

## 2023-09-10 MED ORDER — OXYCODONE HCL 5 MG PO TABS
2.5000 mg | ORAL_TABLET | Freq: Four times a day (QID) | ORAL | Status: DC | PRN
  Administered 2023-09-10 (×2): 2.5 mg via ORAL
  Filled 2023-09-10 (×3): qty 1

## 2023-09-10 MED ORDER — DEXAMETHASONE SODIUM PHOSPHATE 10 MG/ML IJ SOLN
INTRAMUSCULAR | Status: DC | PRN
  Administered 2023-09-10: 5 mg via INTRAVENOUS

## 2023-09-10 MED ORDER — PHENYLEPHRINE 80 MCG/ML (10ML) SYRINGE FOR IV PUSH (FOR BLOOD PRESSURE SUPPORT)
PREFILLED_SYRINGE | INTRAVENOUS | Status: DC | PRN
  Administered 2023-09-10: 80 ug via INTRAVENOUS
  Administered 2023-09-10: 160 ug via INTRAVENOUS

## 2023-09-10 MED ORDER — MIDAZOLAM HCL 2 MG/2ML IJ SOLN
INTRAMUSCULAR | Status: AC
  Filled 2023-09-10: qty 2

## 2023-09-10 MED ORDER — CHLORHEXIDINE GLUCONATE CLOTH 2 % EX PADS
6.0000 | MEDICATED_PAD | Freq: Every day | CUTANEOUS | Status: DC
  Administered 2023-09-10 – 2023-09-12 (×3): 6 via TOPICAL

## 2023-09-10 MED ORDER — SODIUM CHLORIDE 0.9% FLUSH: 10.0000 mL | INTRAVENOUS | Status: DC | PRN

## 2023-09-10 MED ORDER — EPHEDRINE SULFATE-NACL 50-0.9 MG/10ML-% IV SOSY
PREFILLED_SYRINGE | INTRAVENOUS | Status: DC | PRN
  Administered 2023-09-10 (×2): 5 mg via INTRAVENOUS
  Administered 2023-09-10: 15 mg via INTRAVENOUS

## 2023-09-10 MED ORDER — ONDANSETRON HCL 4 MG/2ML IJ SOLN
INTRAMUSCULAR | Status: AC
  Filled 2023-09-10: qty 2

## 2023-09-10 MED ORDER — HEPARIN SODIUM (PORCINE) 5000 UNIT/ML IJ SOLN
5000.0000 [IU] | Freq: Three times a day (TID) | INTRAMUSCULAR | Status: DC
  Administered 2023-09-10 – 2023-09-13 (×8): 5000 [IU] via SUBCUTANEOUS
  Filled 2023-09-10 (×9): qty 1

## 2023-09-10 MED ORDER — FENTANYL 2500MCG IN NS 250ML (10MCG/ML) PREMIX INFUSION: 0.0000 ug/h | INTRAVENOUS | Status: DC

## 2023-09-10 MED ORDER — VASOPRESSIN 20 UNIT/ML IV SOLN
INTRAVENOUS | Status: AC
Start: 2023-09-10 — End: 2023-09-10
  Filled 2023-09-10: qty 1

## 2023-09-10 MED ORDER — CHLORHEXIDINE GLUCONATE 0.12 % MT SOLN
15.0000 mL | Freq: Once | OROMUCOSAL | Status: AC
  Administered 2023-09-10: 15 mL via OROMUCOSAL
  Filled 2023-09-10: qty 15

## 2023-09-10 MED ORDER — PROPOFOL 500 MG/50ML IV EMUL
INTRAVENOUS | Status: DC | PRN
Start: 2023-09-10 — End: 2023-09-10
  Administered 2023-09-10: 25 ug/kg/min via INTRAVENOUS

## 2023-09-10 MED ORDER — INSULIN ASPART 100 UNIT/ML IJ SOLN
0.0000 [IU] | INTRAMUSCULAR | Status: DC
  Administered 2023-09-10 (×4): 3 [IU] via SUBCUTANEOUS
  Administered 2023-09-11 (×2): 2 [IU] via SUBCUTANEOUS

## 2023-09-10 MED ORDER — FENTANYL CITRATE (PF) 250 MCG/5ML IJ SOLN
INTRAMUSCULAR | Status: DC | PRN
  Administered 2023-09-10 (×2): 50 ug via INTRAVENOUS

## 2023-09-10 MED ORDER — CEFAZOLIN SODIUM-DEXTROSE 3-4 GM/150ML-% IV SOLN
3.0000 g | INTRAVENOUS | Status: AC
  Administered 2023-09-10: 3 g via INTRAVENOUS
  Filled 2023-09-10: qty 150

## 2023-09-10 MED ORDER — EPINEPHRINE 1 MG/10ML IJ SOSY
PREFILLED_SYRINGE | INTRAMUSCULAR | Status: DC | PRN
  Administered 2023-09-10: 50 ug via INTRAVENOUS

## 2023-09-10 SURGICAL SUPPLY — 32 items
ARMBAND PINK RESTRICT EXTREMIT (MISCELLANEOUS) ×2 IMPLANT
BAG COUNTER SPONGE SURGICOUNT (BAG) ×2 IMPLANT
BLADE CLIPPER SURG (BLADE) ×2 IMPLANT
BNDG ELASTIC 4X5.8 VLCR STR LF (GAUZE/BANDAGES/DRESSINGS) ×2 IMPLANT
CANISTER SUCTION 3000ML PPV (SUCTIONS) ×2 IMPLANT
CLIP TI MEDIUM 6 (CLIP) ×4 IMPLANT
CLIP TI WIDE RED SMALL 6 (CLIP) ×3 IMPLANT
COVER PROBE W GEL 5X96 (DRAPES) ×2 IMPLANT
DERMABOND ADVANCED .7 DNX12 (GAUZE/BANDAGES/DRESSINGS) ×2 IMPLANT
DERMABOND ADVANCED .7 DNX6 (GAUZE/BANDAGES/DRESSINGS) ×1 IMPLANT
ELECTRODE REM PT RTRN 9FT ADLT (ELECTROSURGICAL) ×2 IMPLANT
GLOVE BIOGEL PI IND STRL 8 (GLOVE) ×2 IMPLANT
GOWN STRL REUS W/ TWL LRG LVL3 (GOWN DISPOSABLE) ×4 IMPLANT
GOWN STRL REUS W/TWL 2XL LVL3 (GOWN DISPOSABLE) ×4 IMPLANT
KIT BASIN OR (CUSTOM PROCEDURE TRAY) ×2 IMPLANT
KIT TURNOVER KIT B (KITS) ×2 IMPLANT
LOOP VESSEL MINI RED (MISCELLANEOUS) ×1 IMPLANT
NS IRRIG 1000ML POUR BTL (IV SOLUTION) ×2 IMPLANT
PACK CV ACCESS (CUSTOM PROCEDURE TRAY) ×2 IMPLANT
PAD ARMBOARD POSITIONER FOAM (MISCELLANEOUS) ×4 IMPLANT
PAD ONESTEP ZOLL R SERIES ADT (MISCELLANEOUS) ×1 IMPLANT
SLING ARM FOAM STRAP LRG (SOFTGOODS) IMPLANT
SPIKE FLUID TRANSFER (MISCELLANEOUS) ×1 IMPLANT
SUT MNCRL AB 4-0 PS2 18 (SUTURE) ×2 IMPLANT
SUT PROLENE 6 0 BV (SUTURE) ×2 IMPLANT
SUT PROLENE 7 0 BV 1 (SUTURE) IMPLANT
SUT SILK 2 0 SH (SUTURE) IMPLANT
SUT SILK 3 0 SH CR/8 (SUTURE) ×2 IMPLANT
SUT VIC AB 3-0 SH 27X BRD (SUTURE) ×2 IMPLANT
TOWEL GREEN STERILE (TOWEL DISPOSABLE) ×2 IMPLANT
UNDERPAD 30X36 HEAVY ABSORB (UNDERPADS AND DIAPERS) ×2 IMPLANT
WATER STERILE IRR 1000ML POUR (IV SOLUTION) ×2 IMPLANT

## 2023-09-10 NOTE — Consult Note (Signed)
 NAME:  Patrick [name], MRN:  [id number], DOB:  06/08/1965, LOS: 0 ADMISSION DATE:  09/10/2023, CONSULTATION DATE:  09/10/2023 REFERRING MD:  Dr. Lanis, CHIEF COMPLAINT:  Hypotension   History of Present Illness:  58 year old male with end-stage renal disease on hemodialysis, diabetes type 2, complicated with vasculopathy status post bilateral BKA, hypertension who presented for AV fistula creation by vascular surgery, after 10 minutes of general anesthesia induction patient became hypotensive, started having diffuse ST depression, he was immediately intubated, received multiple pushes of epinephrine  due to hypotension and case was aborted and patient was brought into the ICU.  PCCM was consulted for evaluation medical management  Pertinent  Medical History   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   Gout    History of blood transfusion 07/09/2020   in [facility]   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/20/2020   History of COVID-19 04/2019   Hypertension    no meds   Hypertensive urgency 09/25/2020   Hypoglycemia 01/03/2022   Osteomyelitis of right foot (HCC)    Pneumonia    x 1   PVD (peripheral vascular disease) (HCC)    s/p R BKA   Right arm cellulitis 01/09/2022   Sepsis due to cellulitis (HCC) 06/04/2020   Subacute osteomyelitis, left ankle and foot (HCC)     Significant Hospital Events: Including procedures, antibiotic start and stop dates in addition to other pertinent events     Interim History / Subjective:  As  above  Objective    Blood pressure (!) 152/89, pulse 85, temperature 98 F (36.7 C), temperature source Oral, resp. rate 18, height 6' 3 (1.905 m), weight (!) 143 kg, SpO2 99%.    Vent Mode: PRVC FiO2 (%):  [100 %] 100 % Set Rate:  [18 bmp] 18 bmp Vt Set:  [670 mL] 670 mL PEEP:  [5 cmH20] 5 cmH20 Plateau Pressure:  [26 cmH20] 26 cmH20   Intake/Output Summary (Last 24 hours) at 09/10/2023 0917 Last data filed at 09/10/2023 0848 Gross per 24 hour  Intake 1200 ml  Output --  Net 1200 ml   Filed Weights   09/10/23 0557  Weight: (!) 143 kg    Examination: General: Crtitically ill-appearing male, orally intubated HEENT: [place]/AT, eyes anicteric.  ETT and cortrak in place Neuro: Sedated, not following commands.  Eyes are closed.  Pupils 3 mm bilateral reactive to light Chest: Coarse breath sounds, no wheezes or rhonchi Heart: Regular rate and rhythm, no murmurs or gallops Abdomen: Soft, nondistended, bowel sounds present Extremities: Bilateral BKA  Labs and Images were reviewed  Patient Lines/Drains/Airways Status     Active Line/Drains/Airways     Name Placement date Placement time Site Days   Arterial Line 09/10/23 Left Radial 09/10/23  0828  Radial  less than 1   Fistula / Graft --  --  --  --   Hemodialysis Catheter Right Internal jugular Double lumen Permanent (Tunneled) 01/01/23  1026  Internal jugular  252  Airway 7.5 mm 09/10/23  9176  -- less than 1   Wound 09/10/23 0837 Surgical Closed Surgical Incision Arm Right 09/10/23  0837  Arm  less than 1        Resolved problem list   Assessment and Plan  Acute respiratory failure with hypoxia Anesthesia induced hypotension Chronic combined biventricular HFrEF and HFpEF Diabetes type 2, complicated with vasculopathy status post bilateral BKA Peripheral arterial disease End-stage renal disease on hemodialysis  Continue protective ventilation VAP prevention bundle in place PAD protocol with propofol  and fentanyl   with RASS goal -1 Patient blood pressure is better now, he is not on any pressors Monitor intake and output GDMT as tolerated Continue sliding scale insulin  CBG goal 140-180, last hemoglobin A1c in November was 6.8 Vascular surgery is following Patient is on hemodialysis Monday Wednesday and Friday, will call nephrology by tomorrow Continue telemetry monitoring   Best Practice (right click and Reselect all SmartList Selections daily)   Diet/type: NPO DVT prophylaxis prophylactic heparin   Pressure ulcer(s): Please refer to nursing notes GI prophylaxis: N/A Lines: Dialysis Catheter Foley:  N/A Code Status:  full code Last date of multidisciplinary goals of care discussion [pending]  Labs   CBC: Recent Labs  Lab 09/10/23 0616  HGB 11.9*  HCT 35.0*    Basic Metabolic Panel: Recent Labs  Lab 09/10/23 0616  NA 137  K 4.7  CL 104  GLUCOSE 122*  BUN 34*  CREATININE 8.30*   GFR: Estimated Creatinine Clearance: 15.2 mL/min (A) (by C-G formula based on SCr of 8.3 mg/dL (H)). No results for input(s): PROCALCITON, WBC, LATICACIDVEN in the last 168 hours.  Liver Function Tests: No results for input(s): AST, ALT, ALKPHOS, BILITOT, PROT, ALBUMIN  in the last 168 hours. No results for input(s): LIPASE, AMYLASE in the last 168 hours. No results for input(s): AMMONIA in the last 168 hours.  ABG    Component Value Date/Time   PHART 7.319 (L) 08/27/2020 1955   PCO2ART 35.8 08/27/2020 1955   PO2ART 216 (H) 08/27/2020 1955   HCO3 16.4 (L) 01/09/2022 1621   TCO2 24 09/10/2023 0616   ACIDBASEDEF 7.7 (H) 01/09/2022 1621   O2SAT 94.2 01/09/2022 1621     Coagulation Profile: No results for input(s): INR, PROTIME in the last 168 hours.  Cardiac Enzymes: No results for input(s): CKTOTAL, CKMB, CKMBINDEX, TROPONINI in the last 168 hours.  HbA1C: Hgb A1c MFr Bld  Date/Time Value Ref Range Status  12/29/2022 09:09 PM 6.8 (H) 4.8 - 5.6 % Final     Comment:    (NOTE) Pre diabetes:          5.7%-6.4%  Diabetes:              >6.4%  Glycemic control for   <7.0% adults with diabetes   01/04/2022 04:38 AM 6.7 (H) 4.8 - 5.6 % Final    Comment:    (NOTE) Pre diabetes:          5.7%-6.4%  Diabetes:              >6.4%  Glycemic control for   <7.0% adults with diabetes     CBG: Recent Labs  Lab 09/10/23 0602  GLUCAP 124*    Review of Systems:   Unable to obtain as patient is intubated and sedated  Past Medical History:  He,  has a past medical history of ABLA (acute blood loss anemia) (09/01/2020), Acute respiratory disease due to COVID-19 virus (05/03/2019), Ambulates with cane, Anemia, Cholelithiasis with  acute cholecystitis (09/25/2020), Chronic kidney disease, Diabetes mellitus, Diabetic foot infection (HCC) (06/04/2020), Diabetic foot ulcer (HCC) (06/04/2020), 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HCC) (06/04/2020), Gout, History of blood transfusion (07/09/2020), 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   Surgical History:   Past Surgical History:  Procedure Laterality Date   AMPUTATION Right 06/06/2020   Procedure: AMPUTATION 5TH RAY;  Surgeon: Harden Jerona GAILS, MD;  Location: [facility] OR;  Service: Orthopedics;  Laterality: Right;   AMPUTATION Right 06/08/2020   Procedure: RIGHT 4TH RAY AMPUTATION;  Surgeon: Harden Jerona GAILS, MD;  Location: [facility] OR;  Service: Orthopedics;  Laterality: Right;   AMPUTATION Right 08/27/2020   Procedure: AMPUTATION BELOW KNEE;  Surgeon: Lucilla Lynwood BRAVO, MD;  Location: [facility] OR;  Service: Orthopedics;  Laterality: Right;   AMPUTATION Left 12/30/2020    Procedure: LEFT BELOW KNEE AMPUTATION;  Surgeon: Harden Jerona GAILS, MD;  Location: [facility] OR;  Service: Orthopedics;  Laterality: Left;   CHOLECYSTECTOMY N/A 09/25/2020   Procedure: LAPAROSCOPIC CHOLECYSTECTOMY;  Surgeon: Paola Dreama SAILOR, MD;  Location: [facility] OR;  Service: General;  Laterality: N/A;   INSERTION OF DIALYSIS CATHETER N/A 01/01/2023   Procedure: INSERTION OF TUNNELED  DIALYSIS CATHETER RIGHT INTERNAL JUGULAR VEIN;  Surgeon: Lanis Fonda BRAVO, MD;  Location: [facility] OR;  Service: Vascular;  Laterality: N/A;     Social History:   reports that he has never smoked. He has never been exposed to tobacco smoke. He has never used smokeless tobacco. He reports that he does not drink alcohol and does not use drugs.   Family History:  His family history is negative for Diabetes.   Allergies No Known Allergies   Home Medications  Prior to Admission medications   Medication Sig Start Date End Date Taking? Authorizing Provider  calcium  acetate (PHOSLO ) 667 MG capsule Take 667 mg by mouth 3 (three) times daily with meals.   Yes [provider]     Critical care time:      The patient is critically ill due to acute respiratory failure with hypoxia/anesthesia induced hypotension.  Critical care was necessary to treat or prevent imminent or life-threatening deterioration.  Critical care was time spent personally by me on the following activities: development of treatment plan with patient and/or surrogate as well as nursing, discussions with consultants, evaluation of patient's response to treatment, examination of patient, obtaining history from patient or surrogate, ordering and performing treatments and interventions, ordering and review of laboratory studies, ordering and review of radiographic studies, pulse oximetry, re-evaluation of patient's condition and participation in multidisciplinary rounds.   During this encounter critical care time was devoted to patient care services described in  this note for 42 minutes.     Valinda Novas, MD [place] [facility] See Amion for pager If no response to pager, please call [phone] until 7pm After 7pm, Please call E-link [phone]

## 2023-09-10 NOTE — Anesthesia Procedure Notes (Signed)
 Arterial Line Insertion Start/End7/22/2025 8:28 AM, 09/10/2023 8:30 AM Performed by: Merla Almarie HERO, DO, anesthesiologist  Patient location: Pre-op . Preanesthetic checklist: patient identified, IV checked, site marked, risks and benefits discussed, surgical consent, monitors and equipment checked, pre-op  evaluation, timeout performed and anesthesia consent Emergency situation Lidocaine  1% used for infiltration Left, radial was placed Catheter size: 20 G Hand hygiene performed  and maximum sterile barriers used   Attempts: 1 Procedure performed using ultrasound guided technique. Following insertion, dressing applied. Post procedure assessment: normal and unchanged  Patient tolerated the procedure well with no immediate complications. Additional procedure comments: Place in code setting.

## 2023-09-10 NOTE — Anesthesia Procedure Notes (Signed)
 Procedure Name: Intubation Date/Time: 09/10/2023 8:23 AM  Performed by: Evetta Delon BROCKS, CRNAPre-anesthesia Checklist: Patient identified, Emergency Drugs available, Suction available and Patient being monitored Patient Re-evaluated:Patient Re-evaluated prior to induction Oxygen Delivery Method: Circle system utilized Preoxygenation: Pre-oxygenation with 100% oxygen Induction Type: IV induction Ventilation: Mask ventilation without difficulty Laryngoscope Size: Glidescope Grade View: Grade I Tube type: Oral Tube size: 7.5 mm Number of attempts: 1 Airway Equipment and Method: Stylet and Oral airway Placement Confirmation: ETT inserted through vocal cords under direct vision, positive ETCO2 and breath sounds checked- equal and bilateral Secured at: 23 cm Tube secured with: Tape Dental Injury: Teeth and Oropharynx as per pre-operative assessment

## 2023-09-10 NOTE — Procedures (Signed)
 Extubation Procedure Note  Patient Details:   Name: Patrick [name] DOB: 07-Jun-1965 MRN: [id number]   Airway Documentation:    Vent end date: 09/10/23 Vent end time: 1115   Evaluation  O2 sats: stable throughout Complications: No apparent complications Patient did tolerate procedure well. Bilateral Breath Sounds: Clear, Diminished   Yes  Patient extubated per order to 2L [place] with no apparent complications. Positive cuff leak was noted prior to extubation. Patient is alert, has strong cough, and is able to speak. Vitals are stable.   Ricard JULIANNA Eagles 09/10/2023, 11:25 AM

## 2023-09-10 NOTE — Anesthesia Procedure Notes (Signed)
 Procedure Name: LMA Insertion Date/Time: 09/10/2023 7:46 AM  Performed by: Evetta Delon BROCKS, CRNAPre-anesthesia Checklist: Patient identified, Emergency Drugs available, Suction available and Patient being monitored Patient Re-evaluated:Patient Re-evaluated prior to induction Oxygen Delivery Method: Circle system utilized Preoxygenation: Pre-oxygenation with 100% oxygen Induction Type: IV induction LMA: LMA inserted LMA Size: 5.0 Placement Confirmation: positive ETCO2 Dental Injury: Teeth and Oropharynx as per pre-operative assessment

## 2023-09-10 NOTE — Transfer of Care (Signed)
 Immediate Anesthesia Transfer of Care Note  Patient: Patrick [name]  Procedure(s) Performed: ATTEMPTED RIGHT ARTERIOVENOUS (AV) FISTULA CREATION (Right: Arm Upper)  Patient Location: ICU  Anesthesia Type:General  Level of Consciousness: Patient remains intubated per anesthesia plan  Airway & Oxygen Therapy: Patient placed on Ventilator (see vital sign flow sheet for setting)  Post-op Assessment: Report given to RN and Post -op Vital signs reviewed and stable  Post vital signs: Reviewed and stable  Last Vitals:  Vitals Value Taken Time  BP 183/91 (aline) 09/10/23 0910  Temp    Pulse 108 09/10/23 09:10  Resp 18 09/10/23 09:10  SpO2 99 % 09/10/23 09:10  Vitals shown include unfiled device data.  Last Pain:  Vitals:   09/10/23 0616  TempSrc:   PainSc: 0-No pain         Complications: No notable events documented.

## 2023-09-10 NOTE — Op Note (Addendum)
    NAME: BERISH BOHMAN    MRN: [id number] DOB: 02-22-1965    DATE OF OPERATION: 09/10/2023  PREOP DIAGNOSIS:    End-stage renal disease  POSTOP DIAGNOSIS:    Same  PROCEDURE:    Attempted right arm brachiocephalic fistula  SURGEON: [name]  ASSIST: Donnice Sender, PA  ANESTHESIA: General  EBL: 5 mL  INDICATIONS:    Patrick [name] is a 58 y.o. male with history of end-stage renal disease requiring dialysis.  He presents for fistula creation.  After discussing risks and benefits, Zacharee elected to proceed.  FINDINGS:   Patient became severely hypotensive 10 minutes into the procedure necessitating intubation, A-line, pressor.  There were EKG changes, therefore the surgery was terminated, and the patient was taken to the ICU intubated for further resuscitation.  TECHNIQUE:   Patient was brought to the OR laid in supine position.  General anesthesia was induced and patient was prepped and draped in standard fashion.  The case began with ultrasound insonation of the right arm.  There appeared to be a bifurcated to cephalic system, with a large cephalic vein in the antecubital fossa.  I thought this would work well for brachiocephalic fistula creation.  A transverse incision was made at the antecubital fossa and taken down to the cephalic vein.  This was looped.  Next, I moved to expose the right brachial artery.  Once exposed, I was working to gain control of the brachial artery when the patient began having airway compromise.  He became severely hypotensive necessitating pressors.  The code alarm was activated.  Fortunately no chest compressions were necessary and the patient rebounded with significant pressor utilization.  I had a discussion with anesthesia regarding Leonid's status, and EKG concerns. Surgery was terminated and the wound bed was closed in layers using Vicryl with Monocryl and Dermabond to the level of the skin.  He was taken to the ICU intubated for further  resuscitation.  Given the complexity of the case,  the assistant was necessary in order to expedient the procedure and safely perform the technical aspects of the operation.  The assistant provided traction and countertraction to assist with exposure of the artery and vein.   [name], MD [facility] DATE OF DICTATION:   09/10/2023

## 2023-09-10 NOTE — Plan of Care (Signed)
 Problem: Education: Goal: Knowledge of General Education information will improve Description: Including pain rating scale, medication(s)/side effects and non-pharmacologic comfort measures Outcome: Progressing   Problem: Health Behavior/Discharge Planning: Goal: Ability to manage health-related needs will improve Outcome: Progressing   Problem: Clinical Measurements: Goal: Ability to maintain clinical measurements within normal limits will improve Outcome: Progressing Goal: Will remain free from infection Outcome: Progressing Goal: Diagnostic test results will improve Outcome: Progressing Goal: Respiratory complications will improve Outcome: Progressing Goal: Cardiovascular complication will be avoided Outcome: Progressing   Problem: Activity: Goal: Risk for activity intolerance will decrease Outcome: Progressing   Problem: Nutrition: Goal: Adequate nutrition will be maintained Outcome: Progressing   Problem: Coping: Goal: Level of anxiety will decrease Outcome: Progressing   Problem: Elimination: Goal: Will not experience complications related to bowel motility Outcome: Progressing Goal: Will not experience complications related to urinary retention Outcome: Progressing   Problem: Pain Managment: Goal: General experience of comfort will improve and/or be controlled Outcome: Progressing   Problem: Safety: Goal: Ability to remain free from injury will improve Outcome: Progressing   Problem: Skin Integrity: Goal: Risk for impaired skin integrity will decrease Outcome: Progressing   Problem: Education: Goal: Knowledge of General Education information will improve Description: Including pain rating scale, medication(s)/side effects and non-pharmacologic comfort measures Outcome: Progressing   Problem: Health Behavior/Discharge Planning: Goal: Ability to manage health-related needs will improve Outcome: Progressing   Problem: Clinical Measurements: Goal:  Ability to maintain clinical measurements within normal limits will improve Outcome: Progressing Goal: Will remain free from infection Outcome: Progressing Goal: Diagnostic test results will improve Outcome: Progressing Goal: Respiratory complications will improve Outcome: Progressing Goal: Cardiovascular complication will be avoided Outcome: Progressing   Problem: Activity: Goal: Risk for activity intolerance will decrease Outcome: Progressing   Problem: Nutrition: Goal: Adequate nutrition will be maintained Outcome: Progressing   Problem: Coping: Goal: Level of anxiety will decrease Outcome: Progressing   Problem: Elimination: Goal: Will not experience complications related to bowel motility Outcome: Progressing Goal: Will not experience complications related to urinary retention Outcome: Progressing   Problem: Pain Managment: Goal: General experience of comfort will improve and/or be controlled Outcome: Progressing   Problem: Safety: Goal: Ability to remain free from injury will improve Outcome: Progressing   Problem: Skin Integrity: Goal: Risk for impaired skin integrity will decrease Outcome: Progressing   Problem: Education: Goal: Ability to describe self-care measures that may prevent or decrease complications (Diabetes Survival Skills Education) will improve Outcome: Progressing Goal: Individualized Educational Video(s) Outcome: Progressing   Problem: Coping: Goal: Ability to adjust to condition or change in health will improve Outcome: Progressing   Problem: Fluid Volume: Goal: Ability to maintain a balanced intake and output will improve Outcome: Progressing   Problem: Health Behavior/Discharge Planning: Goal: Ability to identify and utilize available resources and services will improve Outcome: Progressing Goal: Ability to manage health-related needs will improve Outcome: Progressing   Problem: Metabolic: Goal: Ability to maintain appropriate  glucose levels will improve Outcome: Progressing   Problem: Nutritional: Goal: Maintenance of adequate nutrition will improve Outcome: Progressing Goal: Progress toward achieving an optimal weight will improve Outcome: Progressing   Problem: Skin Integrity: Goal: Risk for impaired skin integrity will decrease Outcome: Progressing   Problem: Tissue Perfusion: Goal: Adequacy of tissue perfusion will improve Outcome: Progressing   Problem: Education: Goal: Ability to describe self-care measures that may prevent or decrease complications (Diabetes Survival Skills Education) will improve Outcome: Progressing Goal: Individualized Educational Video(s) Outcome: Progressing   Problem: Coping: Goal: Ability  to adjust to condition or change in health will improve Outcome: Progressing   Problem: Fluid Volume: Goal: Ability to maintain a balanced intake and output will improve Outcome: Progressing

## 2023-09-10 NOTE — H&P (Signed)
 Office Note   Patient seen and examined in preop holding.  No complaints. No changes to medication history or physical exam since last seen in clinic. After discussing the risks and benefits of right arm fistula v graft creation, Patrick [name] elected to proceed.   Patrick FORBES Rim MD   CC:  ESRD Requesting Provider:  No ref. provider found  HPI: Patrick [name] is a Right handed 58 y.o. (December 23, 1965) male with kidney disease who presents at the request of No ref. provider found for permanent HD access. The patient has had no prior access procedures. Per pt, previous tunneled lines have been placed in right. Current access is right IJ. Dialysis days are MWF.   On exam, Patrick [name] was doing well.  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  He recently moved back to [place].  Medical history includes bilateral lower extremity BKA.  He ambulates with a cane.  Currently doing well on dialysis Monday Wednesday Friday.   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   Gout    History of blood transfusion 07/09/2020   in [place]   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/20/2020   History of COVID-19 04/2019   Hypertension    no meds   Hypertensive urgency 09/25/2020   Hypoglycemia 01/03/2022   Osteomyelitis of right foot (HCC)    Pneumonia    x 1   PVD (peripheral vascular disease) (HCC)    s/p R BKA   Right arm cellulitis 01/09/2022   Sepsis due to cellulitis (HCC) 06/04/2020   Subacute osteomyelitis, left  ankle and foot [facility])     Past Surgical History:  Procedure Laterality Date   AMPUTATION Right 06/06/2020   Procedure: AMPUTATION 5TH RAY;  Surgeon: Harden Jerona GAILS, MD;  Location[facility] OR;  Service: Orthopedics;  Laterality: Right;   AMPUTATION Right 06/08/2020   Procedure: RIGHT 4TH RAY AMPUTATION;  Surgeon: Harden Jerona GAILS, MD;  Location: [facility] OR;  Service: Orthopedics;  Laterality: Right;   AMPUTATION Right 08/27/2020   Procedure: AMPUTATION BELOW KNEE;  Surgeon: Lucilla Lynwood FORBES, MD;  Location: [facility] OR;  Service: Orthopedics;  Laterality: Right;   AMPUTATION Left 12/30/2020   Procedure: LEFT BELOW KNEE AMPUTATION;  Surgeon: Harden Jerona GAILS, MD;  Location: [facility] OR;  Service: Orthopedics;  Laterality: Left;   CHOLECYSTECTOMY N/A 09/25/2020   Procedure: LAPAROSCOPIC CHOLECYSTECTOMY;  Surgeon: Paola Dreama SAILOR, MD;  Location: [facility] OR;  Service: General;  Laterality: N/A;   INSERTION OF DIALYSIS CATHETER N/A 01/01/2023   Procedure: INSERTION OF TUNNELED  DIALYSIS CATHETER RIGHT INTERNAL JUGULAR VEIN;  Surgeon: [name] Patrick FORBES, MD;  Location: [facility] OR;  Service: Vascular;  Laterality: N/A;    Social History   Socioeconomic History   Marital status: Divorced    Spouse name: Not on file   Number of children: Not on file   Years of education: Not on file   Highest education level: Not on file  Occupational History   Occupation: logistics  Tobacco Use   Smoking status: Never    Passive exposure: Never   Smokeless tobacco: Never  Vaping Use   Vaping status: Never Used  Substance and Sexual Activity   Alcohol use: No   Drug use: No   Sexual activity: Not Currently  Other Topics Concern   Not on file  Social History Narrative   Not on file   Social Drivers of Health   Financial Resource Strain: Not on file  Food Insecurity: No Food Insecurity (12/29/2022)   Hunger Vital Sign    Worried About Running Out of Food in the Last Year: Never true    Ran Out of Food in the Last Year: Never true   Transportation Needs: No Transportation Needs (12/29/2022)   PRAPARE - Administrator, Civil Service (Medical): No    Lack of Transportation (Non-Medical): No  Physical Activity: Not on file  Stress: Not on file  Social Connections: Unknown (06/30/2021)   Received from [facility]   Social Network    Social Network: Not on file  Intimate Partner Violence: Not At Risk (12/29/2022)   Humiliation, Afraid, Rape, and Kick questionnaire    Fear of Current or Ex-Partner: No    Emotionally Abused: No    Physically Abused: No    Sexually Abused: No   Family History  Problem Relation Age of Onset   Diabetes Neg Hx     Current Facility-Administered Medications  Medication Dose Route Frequency Provider Last Rate Last Admin   0.9 % irrigation (POUR BTL)    PRN Lanis Patrick BRAVO, MD   1,000 mL at 09/10/23 0659   ceFAZolin  (ANCEF ) IVPB 3g/150 mL premix  3 g Intravenous 30 min Pre-Op  Lanisha Stepanian E, MD       chlorhexidine  (HIBICLENS ) 4 % liquid 4 Application  60 mL Topical Once Alessia Gonsalez E, MD       And   [START ON 09/11/2023] chlorhexidine  (HIBICLENS ) 4 % liquid 4 Application  60 mL Topical Once Yazleen Molock E, MD       heparin  6000 units / NS 500 mL irrigation    PRN Autumnrose Yore E, MD   1 Application at 09/10/23 9340   insulin  aspart (novoLOG ) injection 0-14 Units  0-14 Units Subcutaneous Q2H PRN Darlyn Rush, MD       sodium chloride  flush (NS) 0.9 % injection 3-10 mL  3-10 mL Intravenous PRN Brandin Dilday E, MD       Facility-Administered Medications Ordered in Other Encounters  Medication Dose Route Frequency Provider Last Rate Last Admin   ketamine  (KETALAR ) bolus via infusion 68.05 mg  0.5 mg/kg Intravenous Once Duda, Marcus V, MD        No Known Allergies   REVIEW OF SYSTEMS:  [X]  denotes positive finding, [ ]  denotes negative finding Cardiac  Comments:  Chest pain or chest pressure:    Shortness of breath upon exertion:    Short of breath when lying  flat:    Irregular heart rhythm:        Vascular    Pain in calf, thigh, or hip brought on by ambulation:    Pain in feet at night that wakes you up from your sleep:     Blood clot in your veins:    Leg swelling:         Pulmonary    Oxygen at home:    Productive cough:     Wheezing:  Neurologic    Sudden weakness in arms or legs:     Sudden numbness in arms or legs:     Sudden onset of difficulty speaking or slurred speech:    Temporary loss of vision in one eye:     Problems with dizziness:         Gastrointestinal    Blood in stool:     Vomited blood:         Genitourinary    Burning when urinating:     Blood in urine:        Psychiatric    Major depression:         Hematologic    Bleeding problems:    Problems with blood clotting too easily:        Skin    Rashes or ulcers:        Constitutional    Fever or chills:      PHYSICAL EXAMINATION:  Vitals:   09/10/23 0557  BP: (!) 152/89  Pulse: 85  Resp: 18  Temp: 98 F (36.7 C)  TempSrc: Oral  SpO2: 99%  Weight: (!) 143 kg  Height: 6' 3 (1.905 m)    General:  WDWN in NAD; vital signs documented above Gait: Not observed HENT: WNL, normocephalic Pulmonary: normal non-labored breathing , without Rales, rhonchi,  wheezing Cardiac: regular HR,  Abdomen: soft, NT, no masses Skin: without rashes Vascular Exam/Pulses:  Right Left  Radial 2+ (normal) 2+ (normal)  Ulnar    Brachial 2+ (normal) 2+ (normal)               Extremities: without ischemic changes, without Gangrene , without cellulitis; without open wounds;  Musculoskeletal: no muscle wasting or atrophy  Neurologic: A&O X 3;  No focal weakness or paresthesias are detected Psychiatric:  The pt has Normal affect.   Non-Invasive Vascular Imaging:   +-----------------+-------------+----------+---------+  Right Cephalic   Diameter (cm)Depth (cm)Findings   +-----------------+-------------+----------+---------+  Shoulder             0.29                          +-----------------+-------------+----------+---------+  Prox upper arm       0.34                          +-----------------+-------------+----------+---------+  Mid upper arm        0.41                          +-----------------+-------------+----------+---------+  Dist upper arm       0.19               branching  +-----------------+-------------+----------+---------+  Antecubital fossa    0.25               branching  +-----------------+-------------+----------+---------+  Prox forearm         0.31                          +-----------------+-------------+----------+---------+  Mid forearm          0.07               branching  +-----------------+-------------+----------+---------+  Wrist               0.06                          +-----------------+-------------+----------+---------+   +-----------------+-------------+----------+---------+  Right Basilic    Diameter (cm)Depth (cm)Findings   +-----------------+-------------+----------+---------+  Prox upper arm       0.35                          +-----------------+-------------+----------+---------+  Mid upper arm        0.27               branching  +-----------------+-------------+----------+---------+  Dist upper arm       0.39               branching  +-----------------+-------------+----------+---------+  Antecubital fossa    0.39                          +-----------------+-------------+----------+---------+  Prox forearm         0.23                          +-----------------+-------------+----------+---------+  Mid forearm          0.20               branching  +-----------------+-------------+----------+---------+  Wrist               0.24                          +-----------------+-------------+----------+---------+   +-----------------+-------------+----------+---------+  Left Cephalic    Diameter  (cm)Depth (cm)Findings   +-----------------+-------------+----------+---------+  Shoulder            0.22                          +-----------------+-------------+----------+---------+  Prox upper arm       0.53                          +-----------------+-------------+----------+---------+  Mid upper arm        0.22               branching  +-----------------+-------------+----------+---------+  Dist upper arm       0.26                          +-----------------+-------------+----------+---------+  Antecubital fossa    0.30               branching  +-----------------+-------------+----------+---------+  Prox forearm         0.17                          +-----------------+-------------+----------+---------+  Mid forearm          0.17               branching  +-----------------+-------------+----------+---------+  Wrist               0.19                          +-----------------+-------------+----------+---------+   +-----------------+-------------+----------+--------------+  Left Basilic     Diameter (cm)Depth (cm)   Findings     +-----------------+-------------+----------+--------------+  Shoulder            0.23                               +-----------------+-------------+----------+--------------+  Prox upper arm       0.20                               +-----------------+-------------+----------+--------------+  Mid upper arm        0.24                               +-----------------+-------------+----------+--------------+  Dist upper arm       0.15                 branching     +-----------------+-------------+----------+--------------+  Antecubital fossa                       not visualized  +-----------------+-------------+----------+--------------+  Prox forearm                            not visualized  +-----------------+-------------+----------+--------------+  Mid forearm                              not visualized  +-----------------+-------------+----------+--------------+  Wrist                                  not visualized  +-----------------+-------------+----------+--------------+     ASSESSMENT/PLAN:  ARVID MARENGO is a 58 y.o. male who presents with end stage renal disease  Based on vein mapping and examination, patient is a candidate for right arm fistula creation.  The cephalic vein appears a little small, and the basilic vein appears acceptable in size.  Plan will be for right arm block.  Should this dilate the cephalic vein, I would plan to use this, otherwise he is aware he will receive a brachiobasilic fistula that will require subsequent transposition.. I had an extensive discussion with this patient in regards to the nature of access surgery, including risk, benefits, and alternatives.   The patient is aware that the risks of access surgery include but are not limited to: bleeding, infection, steal syndrome, nerve damage, ischemic monomelic neuropathy, failure of access to mature, complications related to venous hypertension, and possible need for additional access procedures in the future.  I discussed with the patient the nature of the staged access procedure, specifically the need for a second operation to transpose the first stage fistula if it matures adequately.   The patient has agreed to proceed with the above procedure which will be scheduled at his convenience.  Patrick FORBES Rim, MD [facility] [phone] Total time of patient care including pre-visit research, consultation, and documentation greater than 30 minutes

## 2023-09-10 NOTE — Anesthesia Postprocedure Evaluation (Signed)
 Anesthesia Post Note  Patient: SAIR FAULCON  Procedure(s) Performed: ATTEMPTED RIGHT ARTERIOVENOUS (AV) FISTULA CREATION (Right: Arm Upper)     Patient location during evaluation: SICU Anesthesia Type: General Level of consciousness: sedated and patient remains intubated per anesthesia plan Pain management: pain level controlled Vital Signs Assessment: post-procedure vital signs reviewed and stable Respiratory status: patient remains intubated per anesthesia plan and patient on ventilator - see flowsheet for VS Cardiovascular status: stable Anesthetic complications: no   No notable events documented.  Last Vitals:  Vitals:   09/10/23 0557  BP: (!) 152/89  Pulse: 85  Resp: 18  Temp: 36.7 C  SpO2: 99%    Last Pain:  Vitals:   09/10/23 0616  TempSrc:   PainSc: 0-No pain                 Norleen Pope

## 2023-09-11 ENCOUNTER — Encounter (HOSPITAL_COMMUNITY): Payer: Self-pay | Admitting: Vascular Surgery

## 2023-09-11 DIAGNOSIS — Z9889 Other specified postprocedural states: Secondary | ICD-10-CM

## 2023-09-11 DIAGNOSIS — Z48812 Encounter for surgical aftercare following surgery on the circulatory system: Secondary | ICD-10-CM

## 2023-09-11 DIAGNOSIS — I5042 Chronic combined systolic (congestive) and diastolic (congestive) heart failure: Secondary | ICD-10-CM | POA: Diagnosis not present

## 2023-09-11 DIAGNOSIS — I952 Hypotension due to drugs: Secondary | ICD-10-CM | POA: Diagnosis not present

## 2023-09-11 DIAGNOSIS — E1159 Type 2 diabetes mellitus with other circulatory complications: Secondary | ICD-10-CM | POA: Diagnosis not present

## 2023-09-11 DIAGNOSIS — J9601 Acute respiratory failure with hypoxia: Secondary | ICD-10-CM | POA: Diagnosis not present

## 2023-09-11 LAB — RENAL FUNCTION PANEL
Albumin: 3.5 g/dL (ref 3.5–5.0)
Anion gap: 15 (ref 5–15)
BUN: 44 mg/dL — ABNORMAL HIGH (ref 6–20)
CO2: 19 mmol/L — ABNORMAL LOW (ref 22–32)
Calcium: 8.6 mg/dL — ABNORMAL LOW (ref 8.9–10.3)
Chloride: 100 mmol/L (ref 98–111)
Creatinine, Ser: 9.26 mg/dL — ABNORMAL HIGH (ref 0.61–1.24)
GFR, Estimated: 6 mL/min — ABNORMAL LOW (ref >60)
Glucose, Bld: 124 mg/dL — ABNORMAL HIGH (ref 70–99)
Phosphorus: 6.8 mg/dL — ABNORMAL HIGH (ref 2.5–4.6)
Potassium: 5 mmol/L (ref 3.5–5.1)
Sodium: 134 mmol/L — ABNORMAL LOW (ref 135–145)

## 2023-09-11 LAB — GLUCOSE, CAPILLARY
Glucose-Capillary: 117 mg/dL — ABNORMAL HIGH (ref 70–99)
Glucose-Capillary: 122 mg/dL — ABNORMAL HIGH (ref 70–99)
Glucose-Capillary: 132 mg/dL — ABNORMAL HIGH (ref 70–99)
Glucose-Capillary: 105 mg/dL — ABNORMAL HIGH (ref 70–99)
Glucose-Capillary: 114 mg/dL — ABNORMAL HIGH (ref 70–99)

## 2023-09-11 LAB — CBC
HCT: 28.8 % — ABNORMAL LOW (ref 39.0–52.0)
Hemoglobin: 9.3 g/dL — ABNORMAL LOW (ref 13.0–17.0)
MCH: 31.2 pg (ref 26.0–34.0)
MCHC: 32.3 g/dL (ref 30.0–36.0)
MCV: 96.6 fL (ref 80.0–100.0)
Platelets: 247 K/uL (ref 150–400)
RBC: 2.98 MIL/uL — ABNORMAL LOW (ref 4.22–5.81)
RDW: 15.7 % — ABNORMAL HIGH (ref 11.5–15.5)
WBC: 12 K/uL — ABNORMAL HIGH (ref 4.0–10.5)
nRBC: 0 % (ref 0.0–0.2)

## 2023-09-11 LAB — HEPATITIS B SURFACE ANTIGEN: Hepatitis B Surface Ag: NONREACTIVE

## 2023-09-11 LAB — TRIGLYCERIDES: Triglycerides: 109 mg/dL (ref <150)

## 2023-09-11 LAB — HEMOGLOBIN A1C
Hgb A1c MFr Bld: 7 % — ABNORMAL HIGH (ref 4.8–5.6)
Mean Plasma Glucose: 154.2 mg/dL

## 2023-09-11 MED ORDER — HEPARIN SODIUM (PORCINE) 1000 UNIT/ML DIALYSIS
5000.0000 [IU] | Freq: Once | INTRAMUSCULAR | Status: AC
  Administered 2023-09-11: 3200 [IU] via INTRAVENOUS_CENTRAL
  Filled 2023-09-11: qty 5

## 2023-09-11 MED ORDER — MIDODRINE HCL 5 MG PO TABS
5.0000 mg | ORAL_TABLET | Freq: Three times a day (TID) | ORAL | Status: DC
  Administered 2023-09-11: 5 mg via ORAL
  Filled 2023-09-11: qty 1

## 2023-09-11 MED ORDER — SODIUM CHLORIDE 0.9 % IV SOLN: 250.0000 mL | INTRAVENOUS | Status: AC

## 2023-09-11 MED ORDER — CHLORHEXIDINE GLUCONATE CLOTH 2 % EX PADS
6.0000 | MEDICATED_PAD | Freq: Every day | CUTANEOUS | Status: DC
  Administered 2023-09-12: 6 via TOPICAL

## 2023-09-11 MED ORDER — INSULIN ASPART 100 UNIT/ML IJ SOLN: 0.0000 [IU] | Freq: Three times a day (TID) | INTRAMUSCULAR | Status: DC

## 2023-09-11 MED ORDER — TIZANIDINE HCL 4 MG PO TABS
2.0000 mg | ORAL_TABLET | Freq: Every day | ORAL | Status: DC | PRN
  Administered 2023-09-12 (×2): 2 mg via ORAL
  Filled 2023-09-11 (×3): qty 1

## 2023-09-11 MED ORDER — OXYCODONE-ACETAMINOPHEN 5-325 MG PO TABS
1.0000 | ORAL_TABLET | Freq: Four times a day (QID) | ORAL | Status: DC | PRN
  Administered 2023-09-11 – 2023-09-12 (×4): 1 via ORAL
  Filled 2023-09-11 (×5): qty 1

## 2023-09-11 MED ORDER — TIZANIDINE HCL 2 MG PO TABS: 2.0000 mg | ORAL_TABLET | Freq: Two times a day (BID) | ORAL | Status: DC | PRN

## 2023-09-11 MED ORDER — NOREPINEPHRINE 4 MG/250ML-% IV SOLN
0.0000 ug/min | INTRAVENOUS | Status: DC
  Administered 2023-09-11: 2 ug/min via INTRAVENOUS

## 2023-09-11 MED ORDER — MIDODRINE HCL 5 MG PO TABS
10.0000 mg | ORAL_TABLET | Freq: Three times a day (TID) | ORAL | Status: DC
  Administered 2023-09-12 – 2023-09-13 (×4): 10 mg via ORAL
  Filled 2023-09-11 (×4): qty 2

## 2023-09-11 MED ORDER — HYDROMORPHONE HCL 1 MG/ML IJ SOLN
0.5000 mg | Freq: Once | INTRAMUSCULAR | Status: AC
  Administered 2023-09-11: 0.5 mg via INTRAVENOUS
  Filled 2023-09-11: qty 0.5

## 2023-09-11 MED ORDER — HEPARIN SODIUM (PORCINE) 1000 UNIT/ML DIALYSIS: 2000.0000 [IU] | INTRAMUSCULAR | Status: DC | PRN

## 2023-09-11 NOTE — Progress Notes (Addendum)
  Progress Note    09/11/2023 8:29 AM 1 Day Post-Op  Subjective:  Sore throat but otherwise feeling well   Vitals:   09/11/23 0600 09/11/23 0743  BP:    Pulse: 79   Resp: 18   Temp:  98.3 F (36.8 C)  SpO2: 93%    Physical Exam: Lungs:  non labored Incisions:  R arm incision c/d/I; palpable R radial Neurologic: A&O  CBC    Component Value Date/Time   WBC 12.0 (H) 09/11/2023 0350   RBC 2.98 (L) 09/11/2023 0350   HGB 9.3 (L) 09/11/2023 0350   HCT 28.8 (L) 09/11/2023 0350   PLT 247 09/11/2023 0350   MCV 96.6 09/11/2023 0350   MCH 31.2 09/11/2023 0350   MCHC 32.3 09/11/2023 0350   RDW 15.7 (H) 09/11/2023 0350   LYMPHSABS 1.7 02/01/2023 2347   MONOABS 1.5 (H) 02/01/2023 2347   EOSABS 0.0 02/01/2023 2347   BASOSABS 0.1 02/01/2023 2347    BMET    Component Value Date/Time   NA 134 (L) 09/11/2023 0350   K 5.0 09/11/2023 0350   CL 100 09/11/2023 0350   CO2 19 (L) 09/11/2023 0350   GLUCOSE 124 (H) 09/11/2023 0350   BUN 44 (H) 09/11/2023 0350   CREATININE 9.26 (H) 09/11/2023 0350   CREATININE 2.79 (H) 06/01/2021 0000   CALCIUM  8.6 (L) 09/11/2023 0350   GFRNONAA 6 (L) 09/11/2023 0350   GFRAA 52 (L) 05/07/2019 0641    INR    Component Value Date/Time   INR 1.2 01/08/2022 2055     Intake/Output Summary (Last 24 hours) at 09/11/2023 0829 Last data filed at 09/11/2023 0600 Gross per 24 hour  Intake 1354.92 ml  Output 200 ml  Net 1154.92 ml     Assessment/Plan:  58 y.o. male is s/p aborted R arm fistula creation due to hypotensive event and EKG changes 1 Day Post-Op   R arm incision well appearing without hematoma Ok for discharge home after inpatient dialysis if cleared medically.  We can discuss fistula creation vs graft placement as an outpatient   Donnice Sender, PA-C [facility] [phone] 09/11/2023 8:29 AM   VASCULAR STAFF ADDENDUM: I have independently interviewed and examined the patient. I agree with the above.  I  discussed the case with my partner as well as anesthesia.  All parties feel that he would be best served with outpatient follow-up with plans for left arm AV graft in roughly a month as long as he does not have any more events.  Plan will be for block at that time  Appreciate both critical care and hospital medicine involvement in Pervis's care.  [name] MD [facility] Phone Number: [phone] 09/11/2023 3:48 PM

## 2023-09-11 NOTE — Progress Notes (Signed)
   09/11/23 1715  Vitals  Pulse Rate 91  Resp 16  BP (!) 98/55  SpO2 95 %  Oxygen Therapy  Patient Activity (if Appropriate) In bed  Pulse Oximetry Type Continuous  Post Treatment  Dialyzer Clearance Clear  Liters Processed 75.4  Fluid Removed (mL) 3500 mL  Tolerated HD Treatment Yes  Post-Hemodialysis Comments uf goal met   Pt tolerated tx well, uf goal met, alert and stable, cath hep locked

## 2023-09-11 NOTE — Consult Note (Signed)
 NAME:  Patrick [name], MRN:  [id number], DOB:  08/06/1965, LOS: 1 ADMISSION DATE:  09/10/2023, CONSULTATION DATE:  09/10/2023 REFERRING MD:  Dr. Lanis, CHIEF COMPLAINT:  Hypotension   History of Present Illness:  58 year old male with end-stage renal disease on hemodialysis, diabetes type 2, complicated with vasculopathy status post bilateral BKA, hypertension who presented for AV fistula creation by vascular surgery, after 10 minutes of general anesthesia induction patient became hypotensive, started having diffuse ST depression, he was immediately intubated, received multiple pushes of epinephrine  due to hypotension and case was aborted and patient was brought into the ICU.  PCCM was consulted for evaluation medical management  Pertinent  Medical History   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   Gout    History of blood transfusion 07/09/2020   in [facility]   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 Extubated  7/23: consult nephrology for HD  Interim History / Subjective:  NAEON. He is having back pain (which is chronic), sore throat from intubation. Otherwise feels okay this morning. Consulted Dr. Geralynn with nephrology to get HD today. Will keep his a-line while he gets round of HD. If BP stays normal will dc aline and work on transfer out.   Objective    Blood pressure (!) 152/89, pulse 79, temperature 98.3 F (36.8 C), temperature source Oral, resp. rate 18, height 6' 3 (1.905 m), weight (!) 145 kg, SpO2 93%.    Vent Mode: PSV;CPAP FiO2 (%):  [40 %-100 %] 40 % Set Rate:  [18 bmp] 18 bmp Vt Set:  [670 mL] 670 mL PEEP:  [5 cmH20] 5 cmH20 Pressure Support:  [8 cmH20] 8 cmH20 Plateau Pressure:  [26 cmH20] 26 cmH20   Intake/Output Summary (Last 24 hours) at 09/11/2023 0829 Last data filed at 09/11/2023 0600 Gross per 24 hour  Intake 1354.92 ml  Output 200 ml  Net 1154.92 ml   Filed Weights   09/10/23 0557 09/11/23 0600  Weight: (!) 143 kg (!) 145 kg    Examination: General: acute on chronically ill appearing male  HEENT: anicteric sclera, mucous membranes moist  Neuro: awake, alert, oriented, non focal  Chest: CTAB, room air, resp even and unlabored  Heart: Regular rate and rhythm, no murmurs or gallops Abdomen: Soft, nondistended, bowel sounds present Extremities: Bilateral BKA  Labs and Images were  reviewed  Patient Lines/Drains/Airways Status     Active Line/Drains/Airways     Name Placement date Placement time Site Days   Arterial Line 09/10/23 Left Radial 09/10/23  0828  Radial  less than 1   Fistula / Graft --  --  --  --   Hemodialysis Catheter Right Internal jugular Double lumen Permanent (Tunneled) 01/01/23  1026  Internal jugular  252   Airway 7.5 mm 09/10/23  0823  -- less than 1   Wound 09/10/23 0837 Surgical Closed Surgical Incision Arm Right 09/10/23  0837  Arm  less than 1        Resolved problem list    Assessment and Plan  Acute respiratory failure with hypoxia Anesthesia induced hypotension Chronic combined biventricular HFrEF and HFpEF Diabetes type 2, complicated with vasculopathy status post bilateral BKA Peripheral arterial disease End-stage renal disease on hemodialysis  Extubated, off vasopressor support. Unclear why he decompensated in the OR. He is OOB in chair this morning on room air.  Needs dialysis.   - consulted nephrology, to see and placed HD orders  - watch him in ICU with a-line with first round of HD out of OR and make sure BP doesn't drop off again  - remove a-line s/p HD  - add back home tizanidine  for his back pain  - increase oxy to 5mg  - GDMT as tolerating  - ssi, cbg q4h  - vascular following, to make plan about return to OR?   If HD goes okay will transfer out of ICU   Best Practice (right click and Reselect all SmartList Selections daily)   Diet/type: Regular consistency (see orders) DVT prophylaxis prophylactic heparin   GI prophylaxis: N/A Lines: Dialysis Catheter, Arterial Line, and yes and it is still needed Foley:  N/A Code Status:  full code Last date of multidisciplinary goals of care discussion [pending]  Labs   CBC: Recent Labs  Lab 09/10/23 0616 09/10/23 0935 09/11/23 0350  WBC  --   --  12.0*  HGB 11.9* 10.9* 9.3*  HCT 35.0* 32.0* 28.8*  MCV  --   --  96.6  PLT  --   --  247    Basic Metabolic Panel: Recent Labs  Lab 09/10/23 0616 09/10/23 0935 09/11/23 0350  NA 137 136 134*  K 4.7 4.5 5.0  CL 104  --  100  CO2  --   --  19*  GLUCOSE 122*  --  124*  BUN 34*  --  44*  CREATININE 8.30*  --  9.26*  CALCIUM   --   --  8.6*  PHOS  --   --  6.8*   GFR: Estimated Creatinine Clearance: 13.7 mL/min (A) (by C-G formula based on SCr of 9.26 mg/dL (H)). Recent Labs  Lab 09/11/23 0350  WBC 12.0*    Liver Function Tests: Recent Labs  Lab 09/11/23 0350  ALBUMIN  3.5   No results for input(s): LIPASE, AMYLASE in  the last 168 hours. No results for input(s): AMMONIA in the last 168 hours.  ABG    Component Value Date/Time   PHART 7.283 (L) 09/10/2023 0935   PCO2ART 48.3 (H) 09/10/2023 0935   PO2ART 300 (H) 09/10/2023 0935   HCO3 22.9 09/10/2023 0935   TCO2 24 09/10/2023 0935   ACIDBASEDEF 4.0 (H) 09/10/2023 0935   O2SAT 100 09/10/2023 0935     Coagulation Profile: No results for input(s): INR, PROTIME in the last 168 hours.  Cardiac Enzymes: No results for input(s): CKTOTAL, CKMB,  CKMBINDEX, TROPONINI in the last 168 hours.  HbA1C: Hgb A1c MFr Bld  Date/Time Value Ref Range Status  09/11/2023 03:50 AM 7.0 (H) 4.8 - 5.6 % Final    Comment:    (NOTE) Diagnosis of Diabetes The following HbA1c ranges recommended by the American Diabetes Association (ADA) may be used as an aid in the diagnosis of diabetes mellitus.  Hemoglobin             Suggested A1C NGSP%              Diagnosis  <5.7                   Non Diabetic  5.7-6.4                Pre-Diabetic  >6.4                   Diabetic  <7.0                   Glycemic control for                       adults with diabetes.    12/29/2022 09:09 PM 6.8 (H) 4.8 - 5.6 % Final    Comment:    (NOTE) Pre diabetes:          5.7%-6.4%  Diabetes:              >6.4%  Glycemic control for   <7.0% adults with diabetes     CBG: Recent Labs  Lab 09/10/23 1615 09/10/23 1959 09/10/23 2318 09/11/23 0348 09/11/23 0740  GLUCAP 192* 155* 180* 117* 122*    Review of Systems:   As above Past Medical History:  He,  has a past medical history of ABLA (acute blood loss anemia) (09/01/2020), Acute respiratory disease due to COVID-19 virus (05/03/2019), Ambulates with cane, Anemia, Cholelithiasis with acute cholecystitis (09/25/2020), Chronic kidney disease, Diabetes mellitus, Diabetic foot infection (HCC) (06/04/2020), Diabetic foot ulcer (HCC) (06/04/2020), 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HCC) (06/04/2020), Gout, History of blood  transfusion (07/09/2020), 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   Surgical History:   Past Surgical History:  Procedure Laterality Date   AMPUTATION Right 06/06/2020   Procedure: AMPUTATION 5TH RAY;  Surgeon: Harden Jerona GAILS, MD;  Location: [facility] OR;  Service: Orthopedics;  Laterality: Right;   AMPUTATION Right 06/08/2020   Procedure: RIGHT 4TH RAY AMPUTATION;  Surgeon: Harden Jerona GAILS, MD;  Location: [facility] OR;  Service: Orthopedics;  Laterality: Right;   AMPUTATION Right 08/27/2020   Procedure: AMPUTATION BELOW KNEE;  Surgeon: Lucilla Lynwood BRAVO, MD;  Location: [facility] OR;  Service: Orthopedics;  Laterality: Right;   AMPUTATION Left 12/30/2020   Procedure: LEFT BELOW KNEE AMPUTATION;  Surgeon: Harden Jerona GAILS, MD;  Location: [facility] OR;  Service: Orthopedics;  Laterality: Left;   AV FISTULA PLACEMENT Right 09/10/2023   Procedure: ATTEMPTED RIGHT ARTERIOVENOUS (AV) FISTULA CREATION;  Surgeon: Lanis Fonda BRAVO, MD;  Location: [facility] OR;  Service: Vascular;  Laterality: Right;   CHOLECYSTECTOMY N/A 09/25/2020   Procedure: LAPAROSCOPIC CHOLECYSTECTOMY;  Surgeon: Paola Dreama SAILOR, MD;  Location: [facility] OR;  Service: General;  Laterality: N/A;   INSERTION OF DIALYSIS  CATHETER N/A 01/01/2023   Procedure: INSERTION OF TUNNELED  DIALYSIS CATHETER RIGHT INTERNAL JUGULAR VEIN;  Surgeon: Lanis Fonda BRAVO, MD;  Location: [facility] OR;  Service: Vascular;  Laterality: N/A;     Social History:   reports that he has never smoked. He has never been exposed to tobacco smoke. He has never used smokeless tobacco. He reports that he does not drink alcohol and does not use drugs.   Family  History:  His family history is negative for Diabetes.   Allergies No Known Allergies   Home Medications  Prior to Admission medications   Medication Sig Start Date End Date Taking? Authorizing Provider  calcium  acetate (PHOSLO ) 667 MG capsule Take 667 mg by mouth 3 (three) times daily with meals.   Yes [provider]     Critical care time: na    Tinnie BRAVO Adolph DEVONNA [facility] 09/11/23 8:29 AM  Please see [email or web address] for pager details.  From 7A-7P if no response, please call [phone] After hours, please call ELink [phone]

## 2023-09-11 NOTE — Consult Note (Signed)
 Renal Service Consult Note [facility]  Patrick [name] 09/11/2023 Patrick JONETTA Fret, MD Requesting Physician: Dr Lanis  Reason for Consult: ESRD pt w/ intra-operative hypotension HPI: The patient is a 58 y.o. year-old w/ PMH as below who presented for elective AVF/ AVG surgery w/ VVS yesterday morning. Pt went to OR and 10 min into surgery pt's BP dropped severely and surgery was aborted, pt intubated and IV vasopressors used to stabilize the situation. Was admitted to ICU and weaned off vasopressor support mid-day yesterday. This am is stable w/ BP 135/ 80. We are asked to see for dialysis.    Pt seen in ICU chair.  No c/o's. States they usually pull between 3 and 4.5 kg at HD. Denies any leg swelling (sp bilat bka).    ROS - denies CP, no joint pain, no HA, no blurry vision, no rash, no diarrhea, no nausea/ vomiting   Past Medical History  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   Gout    History of blood transfusion 07/09/2020   in [place]   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/20/2020   History of COVID-19 04/2019   Hypertension    no meds   Hypertensive urgency 09/25/2020   Hypoglycemia 01/03/2022   Osteomyelitis of right foot (HCC)    Pneumonia    x 1   PVD (peripheral vascular disease) (HCC)    s/p R BKA   Right arm cellulitis 01/09/2022   Sepsis due to cellulitis (HCC) 06/04/2020   Subacute osteomyelitis, left ankle and foot [facility])    Past Surgical History  Past Surgical History:  Procedure  Laterality Date   AMPUTATION Right 06/06/2020   Procedure: AMPUTATION 5TH RAY;  Surgeon: Harden Jerona GAILS, MD;  Location: [facility] OR;  Service: Orthopedics;  Laterality: Right;   AMPUTATION Right 06/08/2020   Procedure: RIGHT 4TH RAY AMPUTATION;  Surgeon: Harden Jerona GAILS, MD;  Location: [facility] OR;  Service: Orthopedics;  Laterality: Right;   AMPUTATION Right 08/27/2020   Procedure: AMPUTATION BELOW KNEE;  Surgeon: Lucilla Lynwood BRAVO, MD;  Location: [facility] OR;  Service: Orthopedics;  Laterality: Right;   AMPUTATION Left 12/30/2020   Procedure: LEFT BELOW KNEE AMPUTATION;  Surgeon: Harden Jerona GAILS, MD;  Location: [facility] OR;  Service: Orthopedics;  Laterality: Left;   AV FISTULA PLACEMENT Right 09/10/2023   Procedure: ATTEMPTED RIGHT ARTERIOVENOUS (AV) FISTULA CREATION;  Surgeon: Lanis Fonda BRAVO, MD;  Location: [facility] OR;  Service: Vascular;  Laterality: Right;   CHOLECYSTECTOMY N/A 09/25/2020   Procedure: LAPAROSCOPIC CHOLECYSTECTOMY;  Surgeon: Paola Dreama SAILOR, MD;  Location: [facility] OR;  Service: General;  Laterality: N/A;   INSERTION OF DIALYSIS CATHETER N/A 01/01/2023   Procedure: INSERTION OF TUNNELED  DIALYSIS CATHETER RIGHT INTERNAL JUGULAR VEIN;  Surgeon: Lanis Fonda BRAVO, MD;  Location: [facility] OR;  Service: Vascular;  Laterality: N/A;   Family History  Family History  Problem Relation Age of Onset   Diabetes Neg Hx    Social History  reports that he has never smoked. He has never been exposed to tobacco smoke. He has never used smokeless tobacco. He reports that he does not drink alcohol and does not use drugs. Allergies No Known Allergies Home medications Prior to Admission medications   Medication Sig Start Date End Date Taking? Authorizing Provider  calcium  acetate (PHOSLO ) 667 MG capsule Take 667 mg by mouth 3 (three) times daily with meals.   Yes [provider]     Vitals:   09/11/23 0700 09/11/23 0743 09/11/23 0800 09/11/23 0900  BP:      Pulse: 81  79 79  Resp: 17  17   Temp:  98.3 F (36.8 C)     TempSrc:  Oral    SpO2: 93%  93% 93%  Weight:      Height:       Exam Gen alert, no distress No rash, cyanosis or gangrene Sclera anicteric, throat clear  No jvd or bruits Chest clear bilat to bases, no rales/ wheezing RRR no MRG Abd soft ntnd no mass or ascites +bs GU deferred MS bilat BKA Ext no stump or UE edema, no other edema Neuro is alert, Ox 3 , nf    RIJ TDC intact  Home bp meds: none  CXR 7/22- poss vasc congestion, don't see any pulm edema   OP HD: MWF SW 4.5h  B400   136kg   2K bath  RIJ TDC    Heparin  8000 Last OP HD 7/21, post wt 143.8kg Coming off 2- 7 kg over, average leaving 4kg over Mircera 60 mcg q 2 wks Hectorol 7 mcg iv three times per week Sensipar 30mg  po three times per week   Assessment/ Plan: Hypotension, severe: early intra-op yest (new AVF surgery). Surgery was aborted, vasopressors required initially, weaned off yesterday mid-day. Bp's wnl today 140/80.  Volume overload: by wts is usually 3-5kg over dry wt. Not symptomatic, no sig edema on CXR yest, exam is normal today, on RA. Will attempt 3 L w/ HD today.  ESRD: on HD MWF. Last HD Monday. Plan HD today in ICU.  BP: used to be on BP meds but not anymore. Per pt sometimes BP drops into the 70s when on dialysis, but not often. Wt gains are too high, I instructed him to get his wt gains down to 1 kg/ day as a goal.  Anemia of esrd: Hb 9-12 here, follow. No bleeding.     Myer Fret  MD [facility] 09/11/2023, 10:41 AM  Recent Labs  Lab 09/10/23 0616 09/10/23 0935 09/11/23 0350  HGB 11.9* 10.9* 9.3*  ALBUMIN   --   --  3.5  CALCIUM   --   --  8.6*  PHOS  --   --  6.8*  CREATININE 8.30*  --  9.26*  K 4.7 4.5 5.0   Inpatient medications:  Chlorhexidine  Gluconate Cloth  6 each Topical Daily   heparin   5,000 Units Subcutaneous Q8H   insulin  aspart  0-15 Units Subcutaneous Q4H   isosorbide  mononitrate  60 mg Oral Daily   mouth rinse  15 mL Mouth Rinse 4 times per day   sodium chloride  flush   10-40 mL Intracatheter Q12H    labetalol , mouth rinse, oxyCODONE -acetaminophen , phenol, sodium chloride  flush, tiZANidine 

## 2023-09-11 NOTE — Evaluation (Signed)
 Physical Therapy Evaluation Patient Details Name: Patrick [name] MRN: [id number] DOB: 03/19/1965 Today's Date: 09/11/2023  History of Present Illness  58 year old male admitted 7/22 for AV fistula creation, complicated with hypotension and EKG changes post anesthesia induction.  He was intubated and was transferred to ICU.Extubated 7/22.  PMH: ESRD on hemodialysis M,W,F, Bil BKA, HTN, DM  Clinical Impression  Pt admitted with above diagnosis. Pt with several LOB with gait needing min assist of 2 persons for balance with pt using cane.  Cane too short and recommended to pt to go get a cane at local drug store and discussed how to choose height.  Pt also interested in bariatric rollator as his current rollator is too small per pt.  Pt with decr safety with ambulation and pt states,  I just hold onto whatever I can when walking.  Encouraged pt to use rollator to incr BOS.  Pt not following PT directions.  Pt also rushes when walking and has incr cadence making balance poorer.  Pt will benefit from therapy and encouraged to slow down and to use different equipment until he can get more Outpt PT.  Pt currently with functional limitations due to the deficits listed below (see PT Problem List). Pt will benefit from acute skilled PT to increase their independence and safety with mobility to allow discharge.           If plan is discharge home, recommend the following: A little help with walking and/or transfers;Assistance with cooking/housework;Assist for transportation   Can travel by private vehicle        Equipment Recommendations [place] (bari rollator, tall cane, bariatric tub bench)  Recommendations for Other Services       Functional Status Assessment Patient has had a recent decline in their functional status and demonstrates the ability to make significant improvements in function in a reasonable and predictable amount of time.     Precautions / Restrictions Precautions Precautions:  Fall Required Braces or Orthoses: Other Brace Other Brace: Bil LE prostheses Restrictions Weight Bearing Restrictions Per Provider Order: No      Mobility  Bed Mobility Overal bed mobility: Needs Assistance Bed Mobility: Supine to Sit     Supine to sit: Supervision          Transfers Overall transfer level: Needs assistance Equipment used: Straight cane Transfers: Sit to/from Stand Sit to Stand: Min assist, +2 physical assistance           General transfer comment: Needed a little assist to stand from low recliner    Ambulation/Gait Ambulation/Gait assistance: Min assist, +2 physical assistance Gait Distance (Feet): 50 Feet Assistive device: Straight cane Gait Pattern/deviations: Step-through pattern, Decreased stride length, Decreased stance time - right, Decreased weight shift to right, Ataxic, Staggering left, Staggering right, Wide base of support   Gait velocity interpretation: <1.31 ft/sec, indicative of household ambulator   General Gait Details: Pt with decr coordination, widened BOS and appears to circumduct right LE with gait thus decreasing his balance.  Pt had multiple LOB with pt stating Ya'll are making me lose my balance.  Pt reaching for walls to balance much of the time.  Also, noted pts cane is too short and pt states that when they made his last prosthtics 3 months ago, they made them taller so now his cane isnt the correct height.  Discussed that pts fit of right prosthetic may not be right and pt to call [employer] regarding socks.  Stairs  Wheelchair Mobility     Tilt Bed    Modified Rankin (Stroke Patients Only)       Balance                                             Pertinent Vitals/Pain Pain Assessment Pain Assessment: No/denies pain    Home Living Family/patient expects to be discharged to:: Private residence Living Arrangements: Alone Available Help at Discharge:  Family;Friend(s);Neighbor;Available PRN/intermittently Type of Home: Apartment Home Access: Ramped entrance       Home Layout: One level Home Equipment: Cane - single point;Rollator (4 wheels);Wheelchair - manual;Toilet riser      Prior Function Prior Level of Function : Independent/Modified Independent;Driving;Working/employed             Mobility Comments: Uses SPC for mobility;  uses Rollator to fish ADLs Comments: Ind, [place] 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[employer], Works at [employer], mostly at desk all day     Extremity/Trunk Assessment   Upper Extremity Assessment Upper Extremity Assessment: Defer to OT evaluation    Lower Extremity Assessment Lower Extremity Assessment: Overall WFL for tasks assessed       Communication   Communication Communication: No apparent difficulties    Cognition Arousal: Alert Behavior During Therapy: WFL for tasks assessed/performed   PT - Cognitive impairments: No apparent impairments, Safety/Judgement                         Following commands: Intact       Cueing       General Comments General comments (skin integrity, edema, etc.): VSS    Exercises     Assessment/Plan    PT Assessment Patient needs continued PT services  PT Problem List Decreased activity tolerance;Decreased balance;Decreased knowledge of use of DME;Decreased safety awareness;Decreased mobility;Decreased coordination       PT Treatment Interventions DME instruction;Gait training;Functional mobility training;Therapeutic activities;Therapeutic exercise;Balance training;Patient/family education    PT Goals (Current goals can be found in the Care Plan section)  Acute Rehab PT Goals Patient Stated Goal: to go home PT Goal Formulation: With patient Time For Goal Achievement: 09/25/23 Potential to Achieve Goals: Good    Frequency Min 2X/week     Co-evaluation               AM-PAC PT 6 Clicks Mobility  Outcome Measure Help needed turning  from your back to your side while in a flat bed without using bedrails?: None Help needed moving from lying on your back to sitting on the side of a flat bed without using bedrails?: None Help needed moving to and from a bed to a chair (including a wheelchair)?: A Little Help needed standing up from a chair using your arms (e.g., wheelchair or bedside chair)?: A Little Help needed to walk in hospital room?: Total Help needed climbing 3-5 steps with a railing? : Total 6 Click Score: 16    End of Session Equipment Utilized During Treatment: Gait belt Activity Tolerance: Patient limited by fatigue Patient left: in chair;with call bell/phone within reach Nurse Communication: Mobility status PT Visit Diagnosis: Unsteadiness on feet (R26.81)    Time: 8861-8791 PT Time Calculation (min) (ACUTE ONLY): 30 min   Charges:   PT Evaluation $PT Eval Moderate Complexity: 1 Mod PT Treatments $Gait Training: 8-22 mins PT General Charges $$ ACUTE PT VISIT: 1 Visit  [facility] M,PT Acute Rehab Services [phone]   Stephane JULIANNA Bevel 09/11/2023, 2:08 PM

## 2023-09-11 NOTE — Progress Notes (Signed)
 eLink Physician-Brief Progress Note Patient Name: Patrick [name] DOB: 1965-10-12 MRN: [id number]   Date of Service  09/11/2023  HPI/Events of Note    eICU Interventions  SSI coverage changed to [facility] and HS. Dilaudid  0.5 mg iv xx 1 ordered for pain not responsive to percocet. e                                                                                                                 Patrick [name] Patrick [name] 09/11/2023, 10:23 PM

## 2023-09-11 NOTE — Progress Notes (Signed)
 PCCM floor called patient states 8-9 /10 upper body generalized pain I suggest the ordered percocet he stated it does not help only the dilaudid  helped. Asked if CBG cna be changed to ACHS since he is off critical care

## 2023-09-12 DIAGNOSIS — Z9889 Other specified postprocedural states: Secondary | ICD-10-CM | POA: Diagnosis not present

## 2023-09-12 LAB — GLUCOSE, CAPILLARY
Glucose-Capillary: 97 mg/dL (ref 70–99)
Glucose-Capillary: 89 mg/dL (ref 70–99)
Glucose-Capillary: 85 mg/dL (ref 70–99)
Glucose-Capillary: 122 mg/dL — ABNORMAL HIGH (ref 70–99)

## 2023-09-12 LAB — SURGICAL PCR SCREEN
MRSA, PCR: NEGATIVE
Staphylococcus aureus: NEGATIVE

## 2023-09-12 LAB — HEPATITIS B SURFACE ANTIBODY, QUANTITATIVE: Hep B S AB Quant (Post): 194 m[IU]/mL

## 2023-09-12 MED ORDER — CHLORHEXIDINE GLUCONATE CLOTH 2 % EX PADS
6.0000 | MEDICATED_PAD | Freq: Every day | CUTANEOUS | Status: DC
  Administered 2023-09-13: 6 via TOPICAL

## 2023-09-12 MED ORDER — HYDROMORPHONE HCL 1 MG/ML IJ SOLN
0.5000 mg | Freq: Once | INTRAMUSCULAR | Status: AC
  Administered 2023-09-12: 0.5 mg via INTRAVENOUS
  Filled 2023-09-12: qty 0.5

## 2023-09-12 MED ORDER — CALCIUM ACETATE (PHOS BINDER) 667 MG PO CAPS
1334.0000 mg | ORAL_CAPSULE | Freq: Three times a day (TID) | ORAL | Status: DC
  Administered 2023-09-13: 1334 mg via ORAL
  Filled 2023-09-12: qty 2

## 2023-09-12 NOTE — Progress Notes (Signed)
 Pt receives out-pt HD at [facility] [place] on MWF 5:25 am chair time. Will assist as needed.   Randine Mungo Dialysis Navigator [phone]

## 2023-09-12 NOTE — Progress Notes (Signed)
 Transition of Care [facility]) - Inpatient Brief Assessment   Patient Details  Name: [place] [name] MRN: [id number] Date of Birth: [birth year]  Transition of Care [facility] CM/SW Contact:    Rosaline JONELLE Joe, RN Phone Number: 09/12/2023, 10:19 AM   Clinical Narrative: CM met with the patient at the bedside to discuss IP Care management needs.  The patient was provided Medicare choice regarding OP PT/OT and patient was agreeable for referral to OUtpatient therapy clinic on [facility] - referral placed.  Patient was provided Medicare choice regarding DME company and he did not have a preference.  I called [employer] and Rolator was requested to deliver to the patient's room today - DME order placed and  MD requested to co-sign.  DME at the home includes Cane and 2 lower extremity prosthesis - present in the room.  Patient plans to order a tub bench from [facility] since Medicare does not cover.  Patient states that he has family/friend to provide transportation to home when discharged.   Transition of Care Asessment: Insurance and Status: (P) Insurance coverage has been reviewed Patient has primary care physician: (P) Yes Home environment has been reviewed: (P) from home alone Prior level of function:: (P) self Prior/Current Home Services: (P) No current home services Social Drivers of Health Review: (P) SDOH reviewed interventions complete Readmission risk has been reviewed: (P) Yes Transition of care needs: (P) transition of care needs identified, TOC will continue to follow

## 2023-09-12 NOTE — Progress Notes (Signed)
    Durable Medical Equipment  (From admission, onward)           Start     Ordered   09/12/23 1014  For home use only DME 4 wheeled rolling walker with seat  Once       Comments: Patient needs Bariatric heavy duty Rolator - Height - 6'4, weight 290 lbs  Question Answer Comment  Patient needs a walker to treat with the following condition S/P BKA (below knee amputation) bilateral (HCC)   Patient needs a walker to treat with the following condition Physical deconditioning      09/12/23 1014

## 2023-09-12 NOTE — Plan of Care (Signed)
  Problem: Clinical Measurements: Goal: Ability to maintain clinical measurements within normal limits will improve Outcome: Progressing Goal: Will remain free from infection Outcome: Progressing Goal: Diagnostic test results will improve Outcome: Progressing Goal: Respiratory complications will improve Outcome: Progressing Goal: Cardiovascular complication will be avoided Outcome: Progressing   Problem: Nutrition: Goal: Adequate nutrition will be maintained Outcome: Completed/Met   Problem: Coping: Goal: Level of anxiety will decrease Outcome: Completed/Met   Problem: Elimination: Goal: Will not experience complications related to bowel motility Outcome: Progressing   Problem: Pain Managment: Goal: General experience of comfort will improve and/or be controlled Outcome: Progressing

## 2023-09-12 NOTE — Progress Notes (Signed)
  Progress Note    09/12/2023 1:01 PM 2 Days Post-Op  Subjective:  resting comfortably this morning   Vitals:   09/12/23 0551 09/12/23 0808  BP: 110/64 (!) 92/58  Pulse: 92 85  Resp:  19  Temp: 99.1 F (37.3 C) 98.7 F (37.1 C)  SpO2: 95% 92%   Physical Exam: Lungs:  non labored Incisions:  R arm incision c/d/I; palpable R radial Neurologic: A&O  CBC    Component Value Date/Time   WBC 12.0 (H) 09/11/2023 0350   RBC 2.98 (L) 09/11/2023 0350   HGB 9.3 (L) 09/11/2023 0350   HCT 28.8 (L) 09/11/2023 0350   PLT 247 09/11/2023 0350   MCV 96.6 09/11/2023 0350   MCH 31.2 09/11/2023 0350   MCHC 32.3 09/11/2023 0350   RDW 15.7 (H) 09/11/2023 0350   LYMPHSABS 1.7 02/01/2023 2347   MONOABS 1.5 (H) 02/01/2023 2347   EOSABS 0.0 02/01/2023 2347   BASOSABS 0.1 02/01/2023 2347    BMET    Component Value Date/Time   NA 134 (L) 09/11/2023 0350   K 5.0 09/11/2023 0350   CL 100 09/11/2023 0350   CO2 19 (L) 09/11/2023 0350   GLUCOSE 124 (H) 09/11/2023 0350   BUN 44 (H) 09/11/2023 0350   CREATININE 9.26 (H) 09/11/2023 0350   CREATININE 2.79 (H) 06/01/2021 0000   CALCIUM  8.6 (L) 09/11/2023 0350   GFRNONAA 6 (L) 09/11/2023 0350   GFRAA 52 (L) 05/07/2019 0641    INR    Component Value Date/Time   INR 1.2 01/08/2022 2055     Intake/Output Summary (Last 24 hours) at 09/12/2023 1301 Last data filed at 09/11/2023 1800 Gross per 24 hour  Intake 11.05 ml  Output 3500 ml  Net -3488.95 ml     Assessment/Plan:  58 y.o. male is s/p aborted R arm fistula creation due to hypotensive event and EKG changes 2 Days Post-Op   I discussed the case with my partner as well as anesthesia.  All parties feel that he would be best served with outpatient follow-up with plans for left arm AV graft in roughly a month as long as he does not have any more events.  Plan will be for block at that time  Appreciate both critical care and hospital medicine involvement in Zavian's care.  [name] MD [facility] Phone Number: [phone] 09/12/2023 1:01 PM

## 2023-09-12 NOTE — Progress Notes (Signed)
 PROGRESS NOTE    [place] [name]  FMW:[id number] DOB: 1965/03/20 DOA: 09/10/2023 PCP: [facility]   Brief Narrative:  58 year old male with history of end-stage renal disease on hemodialysis, diabetes mellitus type 2, complicated with vasculopathy status post bilateral BKA, hypertension, anemia of chronic disease presented for AV fistula creation by vascular surgery.  After 10 minutes of general anesthesia induction, patient became hypotensive with diffuse ST depression, he was intubated, received multiple pushes of epinephrine  due to hypotension, case was aborted and patient was transferred to ICU.  PCCM was consulted.  Nephrology was also consulted.  Patient underwent hemodialysis.  Subsequently, he was extubated and weaned off vasopressors.  He was transferred out of ICU.  TRH following for medical needs from 09/12/2023.  Assessment & Plan:   Acute respiratory failure with hypoxia -Needed intubation and extubation.  Currently on room air.  Patient has been transferred out of ICU.  TRH following for medical needs from 09/12/2023.  Anesthesia induced hypotension - Required vasopressors in the ICU briefly.  Vasopressors have been weaned off. - Blood pressure intermittently still on the lower side.  Continue midodrine .  End-stage renal disease on hemodialysis - Nephrology following.  Patient has tolerated hemodialysis inpatient.  Hemodialysis schedule as per nephrology recommendations. - Vascular surgery has postponed AV fistula procedure as an outpatient.  Chronic combined systolic and diastolic heart failure - Volume managed by hemodialysis.  Continue strict input and output.  Daily weights and fluid restriction.  Diabetes mellitus type 2 - A1c 7.0.  Continue CBGs with SSI.  Carb modified diet.  Anemia of chronic disease - From renal failure.  Hemoglobin stable.  Monitor intermittently  Leukocytosis No labs today.  Monitor intermittently  Hyponatremia Mild.  Managed by nephrology  by hemodialysis  Acute metabolic acidosis No labs today  Hyperphosphatemia - Managed by nephrology by hemodialysis  Obesity class I - Outpatient follow-up  Subjective: Patient seen and examined at bedside.  Feels weak and tired.  No fever, vomiting, chest pain reported.  Objective: Vitals:   09/11/23 2030 09/11/23 2138 09/12/23 0551 09/12/23 0808  BP: 116/74 (!) 141/82 110/64 (!) 92/58  Pulse: 86 91 92 85  Resp: 19 18  19   Temp:  98.2 F (36.8 C) 99.1 F (37.3 C) 98.7 F (37.1 C)  TempSrc:   Oral Oral  SpO2: 93% 99% 95% 92%  Weight:      Height:        Intake/Output Summary (Last 24 hours) at 09/12/2023 0948 Last data filed at 09/11/2023 1800 Gross per 24 hour  Intake 11.05 ml  Output 3650 ml  Net -3638.95 ml   Filed Weights   09/10/23 0557 09/11/23 0600  Weight: (!) 143 kg (!) 145 kg    Examination:  General exam: Appears calm and comfortable.  On room air. Respiratory system: Bilateral decreased breath sounds at bases with scattered crackles Cardiovascular system: S1 & S2 heard, Rate controlled Gastrointestinal system: Abdomen is obese, nondistended, soft and nontender. Normal bowel sounds heard. Extremities: Bilateral BKA present  Central nervous system: Alert and oriented.  Slow to respond.  No focal neurological deficits. Moving extremities Skin: No rashes, lesions or ulcers Psychiatry: Flat affect.  Not agitated.   Data Reviewed: I have personally reviewed following labs and imaging studies  CBC: Recent Labs  Lab 09/10/23 0616 09/10/23 0935 09/11/23 0350  WBC  --   --  12.0*  HGB 11.9* 10.9* 9.3*  HCT 35.0* 32.0* 28.8*  MCV  --   --  96.6  PLT  --   --  247   Basic Metabolic Panel: Recent Labs  Lab 09/10/23 0616 09/10/23 0935 09/11/23 0350  NA 137 136 134*  K 4.7 4.5 5.0  CL 104  --  100  CO2  --   --  19*  GLUCOSE 122*  --  124*  BUN 34*  --  44*  CREATININE 8.30*  --  9.26*  CALCIUM   --   --  8.6*  PHOS  --   --  6.8*    GFR: Estimated Creatinine Clearance: 13.7 mL/min (A) (by C-G formula based on SCr of 9.26 mg/dL (H)). Liver Function Tests: Recent Labs  Lab 09/11/23 0350  ALBUMIN  3.5   No results for input(s): LIPASE, AMYLASE in the last 168 hours. No results for input(s): AMMONIA in the last 168 hours. Coagulation Profile: No results for input(s): INR, PROTIME in the last 168 hours. Cardiac Enzymes: No results for input(s): CKTOTAL, CKMB, CKMBINDEX, TROPONINI in the last 168 hours. BNP (last 3 results) No results for input(s): PROBNP in the last 8760 hours. HbA1C: Recent Labs    09/11/23 0350  HGBA1C 7.0*   CBG: Recent Labs  Lab 09/11/23 0740 09/11/23 1108 09/11/23 1623 09/11/23 1955 09/12/23 0821  GLUCAP 122* 132* 105* 114* 97   Lipid Profile: Recent Labs    09/11/23 0350  TRIG 109   Thyroid  Function Tests: No results for input(s): TSH, T4TOTAL, FREET4, T3FREE, THYROIDAB in the last 72 hours. Anemia Panel: No results for input(s): VITAMINB12, FOLATE, FERRITIN, TIBC, IRON , RETICCTPCT in the last 72 hours. Sepsis Labs: No results for input(s): PROCALCITON, LATICACIDVEN in the last 168 hours.  Recent Results (from the past 240 hours)  Surgical pcr screen     Status: None   Collection Time: 09/12/23 12:04 AM   Specimen: Nasal Mucosa; Nasal Swab  Result Value Ref Range Status   MRSA, PCR NEGATIVE NEGATIVE Final   Staphylococcus aureus NEGATIVE NEGATIVE Final    Comment: (NOTE) The Xpert SA Assay (FDA approved for NASAL specimens in patients 43 years of age and older), is one component of a comprehensive surveillance program. It is not intended to diagnose infection nor to guide or monitor treatment. Performed at [facility], [address] [place], [place] [address]          Radiology Studies: DG CHEST PORT 1 VIEW Result Date: 09/10/2023 CLINICAL DATA:  Intubation. EXAM: PORTABLE CHEST 1 VIEW COMPARISON:   Chest radiograph dated 02/01/2023. FINDINGS: Endotracheal tube approximately 5 cm above the carina. Right IJ dialysis catheter with tip at the cavoatrial junction. There is mild cardiomegaly and mild vascular congestion and edema. No focal consolidation, pleural effusion, pneumothorax. No acute osseous pathology. IMPRESSION: 1. Endotracheal tube approximately 5 cm above the carina. 2. Mild cardiomegaly and mild vascular congestion and edema. Electronically Signed   By: Vanetta Chou M.D.   On: 09/10/2023 10:27        Scheduled Meds:  Chlorhexidine  Gluconate Cloth  6 each Topical Daily   Chlorhexidine  Gluconate Cloth  6 each Topical Q0600   heparin   5,000 Units Subcutaneous Q8H   insulin  aspart  0-6 Units Subcutaneous TID WC   midodrine   10 mg Oral TID WC   mouth rinse  15 mL Mouth Rinse 4 times per day   sodium chloride  flush  10-40 mL Intracatheter Q12H   Continuous Infusions:  sodium chloride             Sophie Mao, MD [facility] 09/12/2023, 9:48 AM

## 2023-09-12 NOTE — Progress Notes (Signed)
 PT Cancellation Note  Patient Details Name: [place] [name] MRN: [id number] DOB: 10-07-1965   Cancelled Treatment:    Reason Eval/Treat Not Completed: Patient declined, no reason specified (Patient declined due to fatigue and generally not feeling well. PT will continue with attempts.)  [name], PT, MPT  [name] 09/12/2023, 12:33 PM

## 2023-09-12 NOTE — Progress Notes (Signed)
 [facility] Progress Note   Subjective:    Seen and examined patient at bedside. He's now on RA. Tolerated yesterday's HD with net UF 3.5L. Next HD 7/25. Per VVS, plan to re-schedule perm access placement ( L AVG) in 1 month outpatient as long as he doesn't have any more events.  Objective Vitals:   09/12/23 0551 09/12/23 0808 09/12/23 1322 09/12/23 1710  BP: 110/64 (!) 92/58 95/68 138/75  Pulse: 92 85 82 79  Resp:  19 18 18   Temp: 99.1 F (37.3 C) 98.7 F (37.1 C) 98.4 F (36.9 C) 98.3 F (36.8 C)  TempSrc: Oral Oral Oral Oral  SpO2: 95% 92% 94% 95%  Weight:      Height:       Physical Exam General: Awake, alert, on RA, NAD Heart: S1 and S2; No murmurs, gallops, or rubs Lungs: (+) Rales lower-mid lobes; No wheezing Abdomen: Large, soft, non-tender Extremities:No edema Dialysis Access: [facility]   Filed Weights   09/10/23 0557 09/11/23 0600  Weight: (!) 143 kg (!) 145 kg    Intake/Output Summary (Last 24 hours) at 09/12/2023 1734 Last data filed at 09/11/2023 1800 Gross per 24 hour  Intake 2.98 ml  Output --  Net 2.98 ml    Additional Objective Labs: Basic Metabolic Panel: Recent Labs  Lab 09/10/23 0616 09/10/23 0935 09/11/23 0350  NA 137 136 134*  K 4.7 4.5 5.0  CL 104  --  100  CO2  --   --  19*  GLUCOSE 122*  --  124*  BUN 34*  --  44*  CREATININE 8.30*  --  9.26*  CALCIUM   --   --  8.6*  PHOS  --   --  6.8*   Liver Function Tests: Recent Labs  Lab 09/11/23 0350  ALBUMIN  3.5   No results for input(s): LIPASE, AMYLASE in the last 168 hours. CBC: Recent Labs  Lab 09/10/23 0616 09/10/23 0935 09/11/23 0350  WBC  --   --  12.0*  HGB 11.9* 10.9* 9.3*  HCT 35.0* 32.0* 28.8*  MCV  --   --  96.6  PLT  --   --  247   Blood Culture    Component Value Date/Time   SDES  01/08/2022 2301    IN/OUT CATH URINE Performed at [facility], [address], [place], [place] [address]    [facility]  01/08/2022 2301     NONE Performed at [facility], [address] [place], [place] [address]    CULT  01/08/2022 2301    NO GROWTH Performed at [facility], [address] [place], [place] [address]    REPTSTATUS 01/10/2022 FINAL 01/08/2022 2301    Cardiac Enzymes: No results for input(s): CKTOTAL, CKMB, CKMBINDEX, TROPONINI in the last 168 hours. CBG: Recent Labs  Lab 09/11/23 1623 09/11/23 1955 09/12/23 0821 09/12/23 1158 09/12/23 1628  GLUCAP 105* 114* 97 89 85   Iron  Studies: No results for input(s): IRON , TIBC, TRANSFERRIN, FERRITIN in the last 72 hours. Lab Results  Component Value Date   INR 1.2 01/08/2022   INR 1.3 (H) 09/17/2020   INR 1.3 (H) 08/26/2020   Studies/Results: No results found.  Medications:   Chlorhexidine  Gluconate Cloth  6 each Topical Daily   Chlorhexidine  Gluconate Cloth  6 each Topical Q0600   heparin   5,000 Units Subcutaneous Q8H   insulin  aspart  0-6 Units Subcutaneous TID WC   midodrine   10 mg Oral TID WC  mouth rinse  15 mL Mouth Rinse 4 times per day   sodium chloride  flush  10-40 mL Intracatheter Q12H    Dialysis Orders: MWF SW 4.5h  B400   136kg   2K bath  RIJ TDC    Heparin  8000 Last OP HD 7/21, post wt 143.8kg Coming off 2- 7 kg over, average leaving 4kg over Mircera 60 mcg q 2 wks Hectorol 7 mcg iv three times per week Sensipar 30mg  po three times per week  Home bp meds: none   CXR 7/22- poss vasc congestion, don't see any pulm edema  Assessment/Plan: Hypotension, severe: early intra-op yest (new AVF surgery). Surgery was aborted, vasopressors required initially, weaned off yesterday mid-day. Bp's wnl today 140/80. Now on RA. Per VVS, plan for L AVG placement in outpatient in 1 month as long as he doesn't have any more events in the meantime. Volume overload: by wts is usually 3-5kg over dry wt. Not symptomatic, no sig edema on CXR yest, exam is normal today, on RA. Received HD 7/23 and 3.5L  was removed. Next HD 7/25 1st shift.SABRA  ESRD: on HD MWF. Last HD Monday. See above. BP: used to be on BP meds but not anymore. Per pt sometimes BP drops into the 70s when on dialysis, but not often. Wt gains are too high, I instructed him to get his wt gains down to 1 kg/ day as a goal.  Anemia of esrd: Hb 9-12 here, follow. No bleeding.  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 Resuming binders.  Charmaine Piety, NP  [facility] 09/12/2023,5:34 PM  LOS: 2 days

## 2023-09-13 DIAGNOSIS — Z9889 Other specified postprocedural states: Secondary | ICD-10-CM | POA: Diagnosis not present

## 2023-09-13 LAB — BASIC METABOLIC PANEL WITH GFR
Anion gap: 17 — ABNORMAL HIGH (ref 5–15)
BUN: 44 mg/dL — ABNORMAL HIGH (ref 6–20)
CO2: 22 mmol/L (ref 22–32)
Calcium: 8.8 mg/dL — ABNORMAL LOW (ref 8.9–10.3)
Chloride: 95 mmol/L — ABNORMAL LOW (ref 98–111)
Creatinine, Ser: 10.34 mg/dL — ABNORMAL HIGH (ref 0.61–1.24)
GFR, Estimated: 5 mL/min — ABNORMAL LOW (ref >60)
Glucose, Bld: 83 mg/dL (ref 70–99)
Potassium: 5.1 mmol/L (ref 3.5–5.1)
Sodium: 134 mmol/L — ABNORMAL LOW (ref 135–145)

## 2023-09-13 LAB — CBC WITH DIFFERENTIAL/PLATELET
Abs Immature Granulocytes: 0.04 K/uL (ref 0.00–0.07)
Basophils Absolute: 0.1 K/uL (ref 0.0–0.1)
Basophils Relative: 1 %
Eosinophils Absolute: 0.3 K/uL (ref 0.0–0.5)
Eosinophils Relative: 3 %
HCT: 33.6 % — ABNORMAL LOW (ref 39.0–52.0)
Hemoglobin: 10.6 g/dL — ABNORMAL LOW (ref 13.0–17.0)
Immature Granulocytes: 1 %
Lymphocytes Relative: 29 %
Lymphs Abs: 2.3 K/uL (ref 0.7–4.0)
MCH: 30.1 pg (ref 26.0–34.0)
MCHC: 31.5 g/dL (ref 30.0–36.0)
MCV: 95.5 fL (ref 80.0–100.0)
Monocytes Absolute: 0.7 K/uL (ref 0.1–1.0)
Monocytes Relative: 8 %
Neutro Abs: 4.6 K/uL (ref 1.7–7.7)
Neutrophils Relative %: 58 %
Platelets: 276 K/uL (ref 150–400)
RBC: 3.52 MIL/uL — ABNORMAL LOW (ref 4.22–5.81)
RDW: 15.5 % (ref 11.5–15.5)
WBC: 7.9 K/uL (ref 4.0–10.5)
nRBC: 0 % (ref 0.0–0.2)

## 2023-09-13 LAB — GLUCOSE, CAPILLARY: Glucose-Capillary: 82 mg/dL (ref 70–99)

## 2023-09-13 LAB — PHOSPHORUS: Phosphorus: 8.3 mg/dL — ABNORMAL HIGH (ref 2.5–4.6)

## 2023-09-13 LAB — MAGNESIUM: Magnesium: 2.2 mg/dL (ref 1.7–2.4)

## 2023-09-13 MED ORDER — HEPARIN SODIUM (PORCINE) 1000 UNIT/ML IJ SOLN
INTRAMUSCULAR | Status: AC
  Filled 2023-09-13: qty 5

## 2023-09-13 MED ORDER — MIDODRINE HCL 10 MG PO TABS: 10.0000 mg | ORAL_TABLET | Freq: Three times a day (TID) | ORAL | 0 refills | Status: AC

## 2023-09-13 MED ORDER — OXYCODONE-ACETAMINOPHEN 5-325 MG PO TABS: 1.0000 | ORAL_TABLET | Freq: Four times a day (QID) | ORAL | 0 refills | Status: DC | PRN

## 2023-09-13 MED ORDER — CALCIUM ACETATE (PHOS BINDER) 667 MG PO CAPS: 1334.0000 mg | ORAL_CAPSULE | Freq: Three times a day (TID) | ORAL | 0 refills | Status: AC

## 2023-09-13 MED ORDER — TIZANIDINE HCL 2 MG PO TABS: 2.0000 mg | ORAL_TABLET | Freq: Every day | ORAL | 0 refills | Status: AC | PRN

## 2023-09-13 MED ORDER — HEPARIN SODIUM (PORCINE) 1000 UNIT/ML IJ SOLN
INTRAMUSCULAR | Status: AC
  Filled 2023-09-13: qty 4

## 2023-09-13 NOTE — Plan of Care (Signed)

## 2023-09-13 NOTE — Discharge Summary (Signed)
 Physician Discharge Summary  Patrick [name] FMW:[id number] DOB: Aug 23, 1965 DOA: 09/10/2023  PCP: [facility]  Admit date: 09/10/2023 Discharge date: 09/13/2023  Admitted From: Home Disposition: Home  Recommendations for Outpatient Follow-up:  Follow up with PCP in 1 week with repeat CBC/BMP Outpatient follow-up with vascular surgery Outpatient follow-up with dialysis unit as scheduled Follow up in ED if symptoms worsen or new appear   Home Health: No Equipment/Devices: None  Discharge Condition: Stable CODE STATUS: Full Diet recommendation: Heart healthy/renal hemodialysis diet  Brief/Interim Summary: 58 year old male with history of end-stage renal disease on hemodialysis, diabetes mellitus type 2, complicated with vasculopathy status post bilateral BKA, hypertension, anemia of chronic disease presented for AV fistula creation by vascular surgery. After 10 minutes of general anesthesia induction, patient became hypotensive with diffuse ST depression, he was intubated, received multiple pushes of epinephrine  due to hypotension, case was aborted and patient was transferred to ICU. PCCM was consulted. Nephrology was also consulted. Patient underwent hemodialysis. Subsequently, he was extubated and weaned off vasopressors. He was transferred out of ICU.  [facility] took over as primary team on 09/12/2023.  Subsequently, he has remained hemodynamically stable.  Nephrology and vascular surgery have cleared him for discharge.  He will be discharged home today after hemodialysis.  Discharge Diagnoses:   Acute respiratory failure with hypoxia -Needed intubation and extubation.  Currently on room air.  Patient has been transferred out of ICU. [facility] took over as primary team on 09/12/2023.  Subsequently, he has remained hemodynamically stable.     Anesthesia induced hypotension - Required vasopressors in the ICU briefly.  Vasopressors have been weaned off. - Blood pressure improving.  Currently still on  midodrine .   End-stage renal disease on hemodialysis - Nephrology following.  Patient has tolerated hemodialysis inpatient.   - Vascular surgery has postponed AV fistula procedure as an outpatient and cleared him for discharge. -Nephrology has also cleared him for discharge. He will be discharged home today after hemodialysis.   Chronic combined systolic and diastolic heart failure - Volume managed by hemodialysis.  Continue diet and and fluid restriction.   Diabetes mellitus type 2 - A1c 7.0.  Outpatient follow-up with PCP.  Carb modified diet.   Anemia of chronic disease - From renal failure.  Hemoglobin stable.  Monitor intermittently as an outpatient   Leukocytosis -Resolved  Hyponatremia -Mild.  Managed by nephrology by hemodialysis   Acute metabolic acidosis -Resolved   Hyperphosphatemia - Managed by nephrology by hemodialysis   Obesity class I - Outpatient follow-up   Discharge Instructions  Discharge Instructions     Ambulatory referral to Physical Therapy   Complete by: As directed    Iontophoresis - 4 mg/ml of dexamethasone : No   T.E.N.S. Unit Evaluation and Dispense as Indicated: No   Diet - low sodium heart healthy   Complete by: As directed    Renal diet   Increase activity slowly   Complete by: As directed    No wound care   Complete by: As directed       Allergies as of 09/13/2023   No Known Allergies      Medication List     TAKE these medications    calcium  acetate 667 MG capsule Commonly known as: PHOSLO  Take 2 capsules (1,334 mg total) by mouth 3 (three) times daily with meals. What changed: how much to take   midodrine  10 MG tablet Commonly known as: PROAMATINE  Take 1 tablet (10 mg total) by mouth 3 (three) times daily  with meals.   oxyCODONE -acetaminophen  5-325 MG tablet Commonly known as: PERCOCET/ROXICET Take 1 tablet by mouth every 6 (six) hours as needed for severe pain (pain score 7-10).   tiZANidine  2 MG  tablet Commonly known as: ZANAFLEX  Take 1 tablet (2 mg total) by mouth daily as needed for muscle spasms (muscle pain).               Durable Medical Equipment  (From admission, onward)           Start     Ordered   09/12/23 1018  For home use only DME 4 wheeled rolling walker with seat  Once       Comments: Patient needs Bariatric heavy duty Rolator - Height - 6'4, weight 290 lbs  Question Answer Comment  Patient needs a walker to treat with the following condition S/P BKA (below knee amputation) bilateral (HCC)   Patient needs a walker to treat with the following condition Physical deconditioning      09/12/23 1017            Follow-up Information     [facility] Follow up in 1 month(s).   Specialty: Vascular Surgery Why: The office will call you with your appointment Contact information: [address] [place]   [address] [phone]        [facility]. Schedule an appointment as soon as possible for a visit in 1 week(s).   Specialty: Internal Medicine Contact information: [address] [place] [address] [phone]                No Known Allergies  Consultations: Vascular surgery/PCCM/nephrology   Procedures/Studies: DG CHEST PORT 1 VIEW Result Date: 09/10/2023 CLINICAL DATA:  Intubation. EXAM: PORTABLE CHEST 1 VIEW COMPARISON:  Chest radiograph dated 02/01/2023. FINDINGS: Endotracheal tube approximately 5 cm above the carina. Right IJ dialysis catheter with tip at the cavoatrial junction. There is mild cardiomegaly and mild vascular congestion and edema. No focal consolidation, pleural effusion, pneumothorax. No acute osseous pathology. IMPRESSION: 1. Endotracheal tube approximately 5 cm above the carina. 2. Mild cardiomegaly and mild vascular congestion and edema. Electronically Signed   By: Vanetta Chou M.D.   On: 09/10/2023 10:27       Subjective: Patient seen and examined at bedside undergoing hemodialysis.  Feels okay to go home today.  Denies worsening shortness of breath, chest pain or fever.  Discharge Exam: Vitals:   09/13/23 0900 09/13/23 0930  BP: 113/76 105/74  Pulse: 88 92  Resp: 15 14  Temp:    SpO2: 95% 97%    General: Pt is alert, awake, not in acute distress.  Chronically ill in the condition.  On room air. Cardiovascular: rate controlled, S1/S2 + Respiratory: bilateral decreased breath sounds at bases with some scattered crackles Abdominal: Soft, obese, NT, ND, bowel sounds + Extremities: Bilateral BKA present   The results of significant diagnostics from this hospitalization (including imaging, microbiology, ancillary and laboratory) are listed below for reference.     Microbiology: Recent Results (from the past 240 hours)  Surgical pcr screen     Status: None   Collection Time: 09/12/23 12:04 AM   Specimen: Nasal Mucosa; Nasal Swab  Result Value Ref Range Status   MRSA, PCR NEGATIVE NEGATIVE Final   Staphylococcus aureus NEGATIVE NEGATIVE Final    Comment: (NOTE) The Xpert SA Assay (FDA approved for NASAL specimens in patients  99 years of age and older), is one component of a comprehensive surveillance program. It is not intended to diagnose infection nor to guide or monitor treatment. Performed at [facility], [address] [place], [place] [address]      Labs: BNP (last 3 results) Recent Labs    12/29/22 1516 02/01/23 2347  BNP 1,535.5* 444.3*   Basic Metabolic Panel: Recent Labs  Lab 09/10/23 0616 09/10/23 0935 09/11/23 0350 09/13/23 0434  NA 137 136 134* 134*  K 4.7 4.5 5.0 5.1  CL 104  --  100 95*  CO2  --   --  19* 22  GLUCOSE 122*  --  124* 83  BUN 34*  --  44* 44*  CREATININE 8.30*  --  9.26* 10.34*  CALCIUM   --   --  8.6* 8.8*  MG  --   --   --  2.2  PHOS  --   --  6.8* 8.3*   Liver Function Tests: Recent Labs  Lab 09/11/23 0350   ALBUMIN  3.5   No results for input(s): LIPASE, AMYLASE in the last 168 hours. No results for input(s): AMMONIA in the last 168 hours. CBC: Recent Labs  Lab 09/10/23 0616 09/10/23 0935 09/11/23 0350 09/13/23 0434  WBC  --   --  12.0* 7.9  NEUTROABS  --   --   --  4.6  HGB 11.9* 10.9* 9.3* 10.6*  HCT 35.0* 32.0* 28.8* 33.6*  MCV  --   --  96.6 95.5  PLT  --   --  247 276   Cardiac Enzymes: No results for input(s): CKTOTAL, CKMB, CKMBINDEX, TROPONINI in the last 168 hours. BNP: Invalid input(s): POCBNP CBG: Recent Labs  Lab 09/11/23 1955 09/12/23 0821 09/12/23 1158 09/12/23 1628 09/12/23 2001  GLUCAP 114* 97 89 85 122*   D-Dimer No results for input(s): DDIMER in the last 72 hours. Hgb A1c Recent Labs    09/11/23 0350  HGBA1C 7.0*   Lipid Profile Recent Labs    09/11/23 0350  TRIG 109   Thyroid  function studies No results for input(s): TSH, T4TOTAL, T3FREE, THYROIDAB in the last 72 hours.  Invalid input(s): FREET3 Anemia work up No results for input(s): VITAMINB12, FOLATE, FERRITIN, TIBC, IRON , RETICCTPCT in the last 72 hours. Urinalysis    Component Value Date/Time   COLORURINE STRAW (A) 12/29/2022 2125   APPEARANCEUR CLEAR 12/29/2022 2125   LABSPEC 1.006 12/29/2022 2125   PHURINE 5.0 12/29/2022 2125   GLUCOSEU NEGATIVE 12/29/2022 2125   HGBUR MODERATE (A) 12/29/2022 2125   HGBUR negative 01/27/2010 0934   BILIRUBINUR NEGATIVE 12/29/2022 2125   KETONESUR NEGATIVE 12/29/2022 2125   PROTEINUR 100 (A) 12/29/2022 2125   UROBILINOGEN 0.2 10/26/2013 1130   NITRITE NEGATIVE 12/29/2022 2125   LEUKOCYTESUR NEGATIVE 12/29/2022 2125   Sepsis Labs Recent Labs  Lab 09/11/23 0350 09/13/23 0434  WBC 12.0* 7.9   Microbiology Recent Results (from the past 240 hours)  Surgical pcr screen     Status: None   Collection Time: 09/12/23 12:04 AM   Specimen: Nasal Mucosa; Nasal Swab  Result Value Ref Range Status    MRSA, PCR NEGATIVE NEGATIVE Final   Staphylococcus aureus NEGATIVE NEGATIVE Final    Comment: (NOTE) The Xpert SA Assay (FDA approved for NASAL specimens in patients 42 years of age and older), is one component of a comprehensive surveillance program. It is not intended to diagnose infection nor to guide or monitor treatment. Performed at [facility], [address], [place], [place] [address]      Time coordinating discharge: 35 minutes  SIGNED:   Sophie Mao, MD  [facility] 09/13/2023, 9:41 AM

## 2023-09-13 NOTE — Progress Notes (Signed)
 Discharge AVS explained and given to the patient.  No concerns raised and verbalized understanding.

## 2023-09-13 NOTE — Progress Notes (Signed)
 PT Cancellation Note  Patient Details Name: Patrick [name] MRN: [id number] DOB: 02/19/1966   Cancelled Treatment:    Reason Eval/Treat Not Completed: Patient at procedure or test/unavailable (Patient off the floor for dialysis. PT will continue with attempts)  Patrick [name], PT, MPT  Patrick [name] 09/13/2023, 8:56 AM

## 2023-09-13 NOTE — Progress Notes (Signed)
 D/C order noted. Contacted [facility] to be advised of pt's d/c today and that pt should resume care on Monday.   Randine Mungo Dialysis Navigator [phone]

## 2023-09-13 NOTE — Progress Notes (Signed)
 [facility] Progress Note   Subjective:    Seen and examined patient while he was receiving HD. He's now on RA. Patient's BP dropped during treatment and he was given 100 mL of fluid and Albumin . BP did recover. Tolerated previous HD with net UF 3.5L. Per [facility], plan to re-schedule perm access placement ( L AVG) in 1 month outpatient as long as he doesn't have any more events.  Objective Vitals:   09/13/23 1106 09/13/23 1108 09/13/23 1117 09/13/23 1159  BP: (!) 84/66 108/70 101/68 107/62  Pulse: 89 91 93 89  Resp: 15 15 16    Temp:  98.3 F (36.8 C)  98.3 F (36.8 C)  TempSrc:    Oral  SpO2: 97% 95% 95% 99%  Weight:      Height:       Physical Exam General: Awake, alert, on RA, NAD Heart: S1 and S2; No murmurs, gallops, or rubs Lungs: (+) Rales lower-mid lobes; No wheezing Abdomen: Large, soft, non-tender Extremities:No edema Dialysis Access: R [facility]   Filed Weights   09/10/23 0557 09/11/23 0600  Weight: (!) 143 kg (!) 145 kg    Intake/Output Summary (Last 24 hours) at 09/13/2023 1241 Last data filed at 09/13/2023 1108 Gross per 24 hour  Intake --  Output 2875 ml  Net -2875 ml    Additional Objective Labs: Basic Metabolic Panel: Recent Labs  Lab 09/10/23 0616 09/10/23 0935 09/11/23 0350 09/13/23 0434  NA 137 136 134* 134*  K 4.7 4.5 5.0 5.1  CL 104  --  100 95*  CO2  --   --  19* 22  GLUCOSE 122*  --  124* 83  BUN 34*  --  44* 44*  CREATININE 8.30*  --  9.26* 10.34*  CALCIUM   --   --  8.6* 8.8*  PHOS  --   --  6.8* 8.3*   Liver Function Tests: Recent Labs  Lab 09/11/23 0350  ALBUMIN  3.5   No results for input(s): LIPASE, AMYLASE in the last 168 hours. CBC: Recent Labs  Lab 09/10/23 0935 09/11/23 0350 09/13/23 0434  WBC  --  12.0* 7.9  NEUTROABS  --   --  4.6  HGB 10.9* 9.3* 10.6*  HCT 32.0* 28.8* 33.6*  MCV  --  96.6 95.5  PLT  --  247 276   Blood Culture    Component Value Date/Time   SDES  01/08/2022 2301    IN/OUT  CATH URINE Performed at [facility], [address] [place], [place] [address]    [facility]  01/08/2022 2301    NONE Performed at [facility], [address], [place], [place] [address]    CULT  01/08/2022 2301    NO GROWTH Performed at [facility], [address] [place], [place] [address]    REPTSTATUS 01/10/2022 FINAL 01/08/2022 2301    Cardiac Enzymes: No results for input(s): CKTOTAL, CKMB, CKMBINDEX, TROPONINI in the last 168 hours. CBG: Recent Labs  Lab 09/12/23 0821 09/12/23 1158 09/12/23 1628 09/12/23 2001 09/13/23 1156  GLUCAP 97 89 85 122* 82   Iron  Studies: No results for input(s): IRON , TIBC, TRANSFERRIN, FERRITIN in the last 72 hours. Lab Results  Component Value Date   INR 1.2 01/08/2022   INR 1.3 (H) 09/17/2020   INR 1.3 (H) 08/26/2020   Studies/Results: No results found.  Medications:   calcium  acetate  1,334 mg Oral TID WC   Chlorhexidine  Gluconate Cloth  6 each Topical Q0600  heparin   5,000 Units Subcutaneous Q8H   insulin  aspart  0-6 Units Subcutaneous TID WC   midodrine   10 mg Oral TID WC   mouth rinse  15 mL Mouth Rinse 4 times per day   sodium chloride  flush  10-40 mL Intracatheter Q12H    Dialysis Orders: MWF SW 4.5h  B400   136kg   2K bath  RIJ TDC    Heparin  8000 Last OP HD 7/21, post wt 143.8kg Coming off 2- 7 kg over, average leaving 4kg over Mircera 60 mcg q 2 wks Hectorol 7 mcg iv three times per week Sensipar 30mg  po three times per week  Home bp meds: none   CXR 7/22- poss vasc congestion, don't see any pulm edema  Assessment/Plan: Hypotension, severe: early intra-op yest (new AVF surgery). Surgery was aborted, vasopressors required initially, weaned off yesterday mid-day. BP dropped during HD today despite taking midodrine . Now on RA. Per VVS, plan for L AVG placement in outpatient in 1 month as long as he doesn't have any more events in the  meantime. Volume overload: by wts is usually 3-5kg over dry wt. Not symptomatic, no sig edema on CXR yest, exam is normal today, on RA. Received HD 7/25 and 3.5L was removed. Next HD 7/27 1st shift.SABRA  ESRD: on HD MWF. Last HD today. See above. BP: used to be on BP meds but not anymore. Per pt sometimes BP drops into the 70s when on dialysis, but not often. Wt gains are too high, I instructed him to get his wt gains down to 1 kg/ day as a goal.  Anemia of esrd: Hb 9-12 here, follow. No bleeding.  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 Resuming binders.  Belvie Och, NP [facility] 09/13/2023,12:41 PM  LOS: 3 days

## 2023-09-14 ENCOUNTER — Telehealth: Payer: Self-pay | Admitting: Nurse Practitioner

## 2023-09-14 NOTE — Telephone Encounter (Signed)
 TCM Call 09/14/23 I was able to speak with Patrick [name] and he plans on attending his next scheduled HD session. Belvie Och, NP

## 2023-09-14 NOTE — Discharge Planning (Signed)
 [facility] Patient Discharge Orders - [facility], [facility], [facility] CLINIC: AF MWF  Patient's name: Patrick [name] Admit/DC Dates: 09/10/2023 - 09/13/2023  DISCHARGE DIAGNOSES: Acute respiratory failure with hypoxia: pt required intubation while having AVF creation, given epi, and he was transferred to ICU Anesthesia induced hypotension: required vasopressors in ICU setting. BP stabilized but continue midodrine    ESRD requiring HD  HD ORDER CHANGES: Heparin  change: no change EDW Change: no updated weights entered into EPIC. Reassess pt weight at next HD. Continue to challenge patient in OP setting and adjust EDW accordingly.  Bath Change: no  ANEMIA MANAGEMENT: Aranesp: Given: no    ESA dose for discharge: ESA per protocol. Last Mircera given 09/09/23 IV Iron  dose at discharge: 50 mg Venofer  last given 09/09/23. Continue per protocol Transfusion: Given: no  BONE/MINERAL MEDICATIONS: Hectorol/Calcitriol  change: no Sensipar/Parsabiv change: no  ACCESS INTERVENTION/CHANGE: R TDC Details:   RECENT LABS: Recent Labs  Lab 09/11/23 0350 09/13/23 0434  HGB 9.3* 10.6*  NA 134* 134*  K 5.0 5.1  CALCIUM  8.6* 8.8*  PHOS 6.8* 8.3*  ALBUMIN  3.5  --     IV ANTIBIOTICS: no Details:  OTHER ANTICOAGULATION: On Coumadin?: no Last INR: Managed By:  OTHER/APPTS/LAB ORDERS:   D/C Meds to be reconciled by nurse after every discharge.  Completed By: Belvie Och, NP   Reviewed by: MD:______ RN_______

## 2023-09-17 ENCOUNTER — Ambulatory Visit: Admitting: Orthopedic Surgery

## 2023-11-05 NOTE — Progress Notes (Deleted)
 Office Note     CC:  ESRD Requesting Provider:  [facility]  HPI: Patrick [name] is a Right handed 58 y.o. ([birth year]) male with kidney disease who presents at the request of [facility] for permanent HD access. The patient has had no prior access procedures. Per pt, previous tunneled lines have been placed in right. Current access is right IJ. Dialysis days are MWF.   During patient's last right brachiocephalic fistula attempt, the operation was aborted mid case due to cardiopulmonary compromise with EKG concerns.  On exam, Patrick [name] was doing well.  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  He recently moved back to [place].  Medical history includes bilateral lower extremity BKA.  He ambulates with a cane.  Currently doing well on dialysis Monday Wednesday Friday.   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   Gout    History of blood transfusion 07/09/2020   in [place]   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/20/2020   History of COVID-19 04/2019   Hypertension    no meds   Hypertensive urgency 09/25/2020   Hypoglycemia 01/03/2022   Osteomyelitis of right foot (HCC)    Pneumonia    x 1   PVD (peripheral vascular disease) (HCC)    s/p R BKA   Right arm cellulitis 01/09/2022   Sepsis due to cellulitis (HCC) 06/04/2020   Subacute osteomyelitis, left ankle and foot [facility])     Past Surgical History:  Procedure Laterality Date   AMPUTATION Right 06/06/2020   Procedure: AMPUTATION 5TH  RAY;  Surgeon: Harden Jerona GAILS, MD;  Location: [facility] OR;  Service: Orthopedics;  Laterality: Right;   AMPUTATION Right 06/08/2020   Procedure: RIGHT 4TH RAY AMPUTATION;  Surgeon: Harden Jerona GAILS, MD;  Location: [facility] OR;  Service: Orthopedics;  Laterality: Right;   AMPUTATION Right 08/27/2020   Procedure: AMPUTATION BELOW KNEE;  Surgeon: Lucilla Lynwood BRAVO, MD;  Location: [facility] OR;  Service: Orthopedics;  Laterality: Right;   AMPUTATION Left 12/30/2020   Procedure: LEFT BELOW KNEE AMPUTATION;  Surgeon: Harden Jerona GAILS, MD;  Location: [facility] OR;  Service: Orthopedics;  Laterality: Left;   AV FISTULA PLACEMENT Right 09/10/2023   Procedure: ATTEMPTED RIGHT ARTERIOVENOUS (AV) FISTULA CREATION;  Surgeon: Lanis Fonda BRAVO, MD;  Location: [facility] OR;  Service: Vascular;  Laterality: Right;   CHOLECYSTECTOMY N/A 09/25/2020   Procedure: LAPAROSCOPIC CHOLECYSTECTOMY;  Surgeon: Paola Dreama SAILOR, MD;  Location: [facility] OR;  Service: General;  Laterality: N/A;   INSERTION OF DIALYSIS CATHETER N/A 01/01/2023   Procedure: INSERTION OF TUNNELED  DIALYSIS CATHETER RIGHT INTERNAL JUGULAR VEIN;  Surgeon: Lanis Fonda BRAVO, MD;  Location: [facility] OR;  Service: Vascular;  Laterality: N/A;    Social History   Socioeconomic History   Marital status: Divorced    Spouse name: Not on file   Number of children: Not on file   Years of education: Not on file   Highest education level:  Not on file  Occupational History   Occupation: logistics  Tobacco Use   Smoking status: Never    Passive exposure: Never   Smokeless tobacco: Never  Vaping Use   Vaping status: Never Used  Substance and Sexual Activity   Alcohol use: No   Drug use: No   Sexual activity: Not Currently  Other Topics Concern   Not on file  Social History Narrative   Not on file   Social Drivers of Health   Financial Resource Strain: Not on file  Food Insecurity: No Food Insecurity (09/10/2023)   Hunger Vital Sign    Worried About Running Out of Food in the Last Year: Never true     Ran Out of Food in the Last Year: Never true  Transportation Needs: No Transportation Needs (09/10/2023)   PRAPARE - Administrator, Civil Service (Medical): No    Lack of Transportation (Non-Medical): No  Physical Activity: Not on file  Stress: Not on file  Social Connections: Unknown (06/30/2021)   [facility]   Social Network    Social Network: Not on file  Intimate Partner Violence: Not At Risk (09/10/2023)   Humiliation, Afraid, Rape, and Kick questionnaire    Fear of Current or Ex-Partner: No    Emotionally Abused: No    Physically Abused: No    Sexually Abused: No   Family History  Problem Relation Age of Onset   Diabetes Neg Hx     Current Outpatient Medications  Medication Sig Dispense Refill   midodrine  (PROAMATINE ) 10 MG tablet Take 1 tablet (10 mg total) by mouth 3 (three) times daily with meals. 90 tablet 0   oxyCODONE -acetaminophen  (PERCOCET/ROXICET) 5-325 MG tablet Take 1 tablet by mouth every 6 (six) hours as needed for severe pain (pain score 7-10). 14 tablet 0   tiZANidine  (ZANAFLEX ) 2 MG tablet Take 1 tablet (2 mg total) by mouth daily as needed for muscle spasms (muscle pain). 30 tablet 0   No current facility-administered medications for this visit.   Facility-Administered Medications Ordered in Other Visits  Medication Dose Route Frequency Provider Last Rate Last Admin   ketamine  (KETALAR ) bolus via infusion 68.05 mg  0.5 mg/kg Intravenous Once Duda, Marcus V, MD        No Known Allergies   REVIEW OF SYSTEMS:  [X]  denotes positive finding, [ ]  denotes negative finding Cardiac  Comments:  Chest pain or chest pressure:    Shortness of breath upon exertion:    Short of breath when lying flat:    Irregular heart rhythm:        Vascular    Pain in calf, thigh, or hip brought on by ambulation:    Pain in feet at night that wakes you up from your sleep:     Blood clot in your veins:    Leg swelling:         Pulmonary     Oxygen at home:    Productive cough:     Wheezing:         Neurologic    Sudden weakness in arms or legs:     Sudden numbness in arms or legs:     Sudden onset of difficulty speaking or slurred speech:    Temporary loss of vision in one eye:     Problems with dizziness:         Gastrointestinal    Blood in stool:     Vomited blood:  Genitourinary    Burning when urinating:     Blood in urine:        Psychiatric    Major depression:         Hematologic    Bleeding problems:    Problems with blood clotting too easily:        Skin    Rashes or ulcers:        Constitutional    Fever or chills:      PHYSICAL EXAMINATION:  There were no vitals filed for this visit.   General:  WDWN in NAD; vital signs documented above Gait: Not observed HENT: WNL, normocephalic Pulmonary: normal non-labored breathing , without Rales, rhonchi,  wheezing Cardiac: regular HR,  Abdomen: soft, NT, no masses Skin: without rashes Vascular Exam/Pulses:  Right Left  Radial 2+ (normal) 2+ (normal)  Ulnar    Brachial 2+ (normal) 2+ (normal)               Extremities: without ischemic changes, without Gangrene , without cellulitis; without open wounds;  Musculoskeletal: no muscle wasting or atrophy  Neurologic: A&O X 3;  No focal weakness or paresthesias are detected Psychiatric:  The pt has Normal affect.   Non-Invasive Vascular Imaging:   +-----------------+-------------+----------+---------+  Right Cephalic   Diameter (cm)Depth (cm)Findings   +-----------------+-------------+----------+---------+  Shoulder            0.29                          +-----------------+-------------+----------+---------+  Prox upper arm       0.34                          +-----------------+-------------+----------+---------+  Mid upper arm        0.41                          +-----------------+-------------+----------+---------+  Dist upper arm       0.19                branching  +-----------------+-------------+----------+---------+  Antecubital fossa    0.25               branching  +-----------------+-------------+----------+---------+  Prox forearm         0.31                          +-----------------+-------------+----------+---------+  Mid forearm          0.07               branching  +-----------------+-------------+----------+---------+  Wrist               0.06                          +-----------------+-------------+----------+---------+   +-----------------+-------------+----------+---------+  Right Basilic    Diameter (cm)Depth (cm)Findings   +-----------------+-------------+----------+---------+  Prox upper arm       0.35                          +-----------------+-------------+----------+---------+  Mid upper arm        0.27               branching  +-----------------+-------------+----------+---------+  Dist upper arm       0.39  branching  +-----------------+-------------+----------+---------+  Antecubital fossa    0.39                          +-----------------+-------------+----------+---------+  Prox forearm         0.23                          +-----------------+-------------+----------+---------+  Mid forearm          0.20               branching  +-----------------+-------------+----------+---------+  Wrist               0.24                          +-----------------+-------------+----------+---------+   +-----------------+-------------+----------+---------+  Left Cephalic    Diameter (cm)Depth (cm)Findings   +-----------------+-------------+----------+---------+  Shoulder            0.22                          +-----------------+-------------+----------+---------+  Prox upper arm       0.53                          +-----------------+-------------+----------+---------+  Mid upper arm        0.22                branching  +-----------------+-------------+----------+---------+  Dist upper arm       0.26                          +-----------------+-------------+----------+---------+  Antecubital fossa    0.30               branching  +-----------------+-------------+----------+---------+  Prox forearm         0.17                          +-----------------+-------------+----------+---------+  Mid forearm          0.17               branching  +-----------------+-------------+----------+---------+  Wrist               0.19                          +-----------------+-------------+----------+---------+   +-----------------+-------------+----------+--------------+  Left Basilic     Diameter (cm)Depth (cm)   Findings     +-----------------+-------------+----------+--------------+  Shoulder            0.23                               +-----------------+-------------+----------+--------------+  Prox upper arm       0.20                               +-----------------+-------------+----------+--------------+  Mid upper arm        0.24                               +-----------------+-------------+----------+--------------+  Dist upper arm  0.15                 branching     +-----------------+-------------+----------+--------------+  Antecubital fossa                       not visualized  +-----------------+-------------+----------+--------------+  Prox forearm                            not visualized  +-----------------+-------------+----------+--------------+  Mid forearm                             not visualized  +-----------------+-------------+----------+--------------+  Wrist                                  not visualized  +-----------------+-------------+----------+--------------+     ASSESSMENT/PLAN:  Patrick [name] is a 58 y.o. male who presents with end stage renal disease  Based on vein mapping and  examination, patient is a candidate for right arm fistula creation.  The cephalic vein appears a little small, and the basilic vein appears acceptable in size.  Plan will be for right arm block.  Should this dilate the cephalic vein, I would plan to use this, otherwise he is aware he will receive a brachiobasilic fistula that will require subsequent transposition.. I had an extensive discussion with this patient in regards to the nature of access surgery, including risk, benefits, and alternatives.   The patient is aware that the risks of access surgery include but are not limited to: bleeding, infection, steal syndrome, nerve damage, ischemic monomelic neuropathy, failure of access to mature, complications related to venous hypertension, and possible need for additional access procedures in the future.  I discussed with the patient the nature of the staged access procedure, specifically the need for a second operation to transpose the first stage fistula if it matures adequately.   The patient has agreed to proceed with the above procedure which will be scheduled at his convenience.  [name], MD [facility] [phone] Total time of patient care including pre-visit research, consultation, and documentation greater than 30 minutes

## 2023-11-07 ENCOUNTER — Ambulatory Visit: Admitting: Vascular Surgery

## 2023-11-21 ENCOUNTER — Ambulatory Visit: Attending: Vascular Surgery | Admitting: Vascular Surgery

## 2023-11-21 ENCOUNTER — Encounter: Payer: Self-pay | Admitting: Vascular Surgery

## 2023-11-21 VITALS — BP 150/97 | HR 83 | Temp 98.7°F | Resp 18 | Ht 75.0 in | Wt 319.0 lb

## 2023-11-21 DIAGNOSIS — N186 End stage renal disease: Secondary | ICD-10-CM

## 2023-11-21 NOTE — Progress Notes (Signed)
 Office Note     CC:  ESRD Requesting Provider:  [facility]  HPI: Patrick [name] is a Right handed 58 y.o. ([birth year]) male with kidney disease who presents at the request of Pa[facility] for permanent HD access.  Patient's previous right sided access procedure was aborted due to cardiopulmonary complications which almost resulted in a code.  He was taken to the ICU intubated, but was subsequently extubated, and discharged a few days later.  Current dialysis is Monday Wednesday Friday via right internal jugular catheter   On exam, Patrick [name] was doing well.  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  He recently moved back to [place].  Medical history includes bilateral lower extremity BKA.  He ambulates with a cane.  Currently doing well on dialysis Monday Wednesday Friday.   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   Gout    History of blood transfusion 07/09/2020   in [place]   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 07/20/2020   History of COVID-19 04/2019   Hypertension    no meds   Hypertensive urgency 09/25/2020   Hypoglycemia 01/03/2022   Osteomyelitis of right foot (HCC)    Pneumonia    x 1   PVD (peripheral vascular disease)    s/p R BKA   Right arm cellulitis 01/09/2022   Sepsis due to cellulitis (HCC) 06/04/2020   Subacute osteomyelitis, left ankle and foot [facility])     Past Surgical History:  Procedure Laterality Date   AMPUTATION Right 06/06/2020   Procedure: AMPUTATION 5TH RAY;   Surgeon: Harden Jerona GAILS, MD;  Location: [facility] OR;  Service: Orthopedics;  Laterality: Right;   AMPUTATION Right 06/08/2020   Procedure: RIGHT 4TH RAY AMPUTATION;  Surgeon: Harden Jerona GAILS, MD;  Location: [facility] OR;  Service: Orthopedics;  Laterality: Right;   AMPUTATION Right 08/27/2020   Procedure: AMPUTATION BELOW KNEE;  Surgeon: Lucilla Lynwood BRAVO, MD;  Location: [facility] OR;  Service: Orthopedics;  Laterality: Right;   AMPUTATION Left 12/30/2020   Procedure: LEFT BELOW KNEE AMPUTATION;  Surgeon: Harden Jerona GAILS, MD;  Location[facility] OR;  Service: Orthopedics;  Laterality: Left;   AV FISTULA PLACEMENT Right 09/10/2023   Procedure: ATTEMPTED RIGHT ARTERIOVENOUS (AV) FISTULA CREATION;  Surgeon: Lanis Fonda BRAVO, MD;  Location: [facility] OR;  Service: Vascular;  Laterality: Right;   CHOLECYSTECTOMY N/A 09/25/2020   Procedure: LAPAROSCOPIC CHOLECYSTECTOMY;  Surgeon: Paola Dreama SAILOR, MD;  Location: [facility] OR;  Service: General;  Laterality: N/A;   INSERTION OF DIALYSIS CATHETER N/A 01/01/2023   Procedure: INSERTION OF TUNNELED  DIALYSIS CATHETER RIGHT INTERNAL JUGULAR VEIN;  Surgeon: Lanis Fonda BRAVO, MD;  Location: [facility] OR;  Service: Vascular;  Laterality: N/A;    Social History   Socioeconomic History   Marital status: Divorced    Spouse name: Not on file   Number of children: Not on file   Years of education: Not on file   Highest education level: Not  on file  Occupational History   Occupation: logistics  Tobacco Use   Smoking status: Never    Passive exposure: Never   Smokeless tobacco: Never  Vaping Use   Vaping status: Never Used  Substance and Sexual Activity   Alcohol use: No   Drug use: No   Sexual activity: Not Currently  Other Topics Concern   Not on file  Social History Narrative   Not on file   Social Drivers of Health   Financial Resource Strain: Not on file  Food Insecurity: No Food Insecurity (09/10/2023)   Hunger Vital Sign    Worried About Running Out of Food in the Last Year: Never true    Ran  Out of Food in the Last Year: Never true  Transportation Needs: No Transportation Needs (09/10/2023)   PRAPARE - Administrator, Civil Service (Medical): No    Lack of Transportation (Non-Medical): No  Physical Activity: Not on file  Stress: Not on file  Social Connections: Unknown (06/30/2021)   Received from [facility]   Social Network    Social Network: Not on file  Intimate Partner Violence: Not At Risk (09/10/2023)   Humiliation, Afraid, Rape, and Kick questionnaire    Fear of Current or Ex-Partner: No    Emotionally Abused: No    Physically Abused: No    Sexually Abused: No   Family History  Problem Relation Age of Onset   Diabetes Neg Hx     Current Outpatient Medications  Medication Sig Dispense Refill   midodrine  (PROAMATINE ) 10 MG tablet Take 1 tablet (10 mg total) by mouth 3 (three) times daily with meals. 90 tablet 0   oxyCODONE -acetaminophen  (PERCOCET/ROXICET) 5-325 MG tablet Take 1 tablet by mouth every 6 (six) hours as needed for severe pain (pain score 7-10). 14 tablet 0   tiZANidine  (ZANAFLEX ) 2 MG tablet Take 1 tablet (2 mg total) by mouth daily as needed for muscle spasms (muscle pain). 30 tablet 0   No current facility-administered medications for this visit.   Facility-Administered Medications Ordered in Other Visits  Medication Dose Route Frequency Provider Last Rate Last Admin   ketamine  (KETALAR ) bolus via infusion 68.05 mg  0.5 mg/kg Intravenous Once Duda, Marcus V, MD        No Known Allergies   REVIEW OF SYSTEMS:  [X]  denotes positive finding, [ ]  denotes negative finding Cardiac  Comments:  Chest pain or chest pressure:    Shortness of breath upon exertion:    Short of breath when lying flat:    Irregular heart rhythm:        Vascular    Pain in calf, thigh, or hip brought on by ambulation:    Pain in feet at night that wakes you up from your sleep:     Blood clot in your veins:    Leg swelling:         Pulmonary    Oxygen  at home:    Productive cough:     Wheezing:         Neurologic    Sudden weakness in arms or legs:     Sudden numbness in arms or legs:     Sudden onset of difficulty speaking or slurred speech:    Temporary loss of vision in one eye:     Problems with dizziness:         Gastrointestinal    Blood in stool:     Vomited blood:  Genitourinary    Burning when urinating:     Blood in urine:        Psychiatric    Major depression:         Hematologic    Bleeding problems:    Problems with blood clotting too easily:        Skin    Rashes or ulcers:        Constitutional    Fever or chills:      PHYSICAL EXAMINATION:  Vitals:   11/21/23 0931  BP: (!) 150/97  Pulse: 83  Resp: 18  Temp: 98.7 F (37.1 C)  TempSrc: Temporal  SpO2: 100%  Weight: (!) 319 lb (144.7 kg)  Height: 6' 3 (1.905 m)     General:  WDWN in NAD; vital signs documented above Gait: Not observed HENT: WNL, normocephalic Pulmonary: normal non-labored breathing , without Rales, rhonchi,  wheezing Cardiac: regular HR,  Abdomen: soft, NT, no masses Skin: without rashes Vascular Exam/Pulses:  Right Left  Radial 2+ (normal) 2+ (normal)  Ulnar    Brachial 2+ (normal) 2+ (normal)               Extremities: without ischemic changes, without Gangrene , without cellulitis; without open wounds;  Musculoskeletal: no muscle wasting or atrophy  Neurologic: A&O X 3;  No focal weakness or paresthesias are detected Psychiatric:  The pt has Normal affect.   Non-Invasive Vascular Imaging:   +-----------------+-------------+----------+---------+  Right Cephalic   Diameter (cm)Depth (cm)Findings   +-----------------+-------------+----------+---------+  Shoulder            0.29                          +-----------------+-------------+----------+---------+  Prox upper arm       0.34                          +-----------------+-------------+----------+---------+  Mid upper arm         0.41                          +-----------------+-------------+----------+---------+  Dist upper arm       0.19               branching  +-----------------+-------------+----------+---------+  Antecubital fossa    0.25               branching  +-----------------+-------------+----------+---------+  Prox forearm         0.31                          +-----------------+-------------+----------+---------+  Mid forearm          0.07               branching  +-----------------+-------------+----------+---------+  Wrist               0.06                          +-----------------+-------------+----------+---------+   +-----------------+-------------+----------+---------+  Right Basilic    Diameter (cm)Depth (cm)Findings   +-----------------+-------------+----------+---------+  Prox upper arm       0.35                          +-----------------+-------------+----------+---------+  Mid upper arm  0.27               branching  +-----------------+-------------+----------+---------+  Dist upper arm       0.39               branching  +-----------------+-------------+----------+---------+  Antecubital fossa    0.39                          +-----------------+-------------+----------+---------+  Prox forearm         0.23                          +-----------------+-------------+----------+---------+  Mid forearm          0.20               branching  +-----------------+-------------+----------+---------+  Wrist               0.24                          +-----------------+-------------+----------+---------+   +-----------------+-------------+----------+---------+  Left Cephalic    Diameter (cm)Depth (cm)Findings   +-----------------+-------------+----------+---------+  Shoulder            0.22                          +-----------------+-------------+----------+---------+  Prox upper arm       0.53                           +-----------------+-------------+----------+---------+  Mid upper arm        0.22               branching  +-----------------+-------------+----------+---------+  Dist upper arm       0.26                          +-----------------+-------------+----------+---------+  Antecubital fossa    0.30               branching  +-----------------+-------------+----------+---------+  Prox forearm         0.17                          +-----------------+-------------+----------+---------+  Mid forearm          0.17               branching  +-----------------+-------------+----------+---------+  Wrist               0.19                          +-----------------+-------------+----------+---------+   +-----------------+-------------+----------+--------------+  Left Basilic     Diameter (cm)Depth (cm)   Findings     +-----------------+-------------+----------+--------------+  Shoulder            0.23                               +-----------------+-------------+----------+--------------+  Prox upper arm       0.20                               +-----------------+-------------+----------+--------------+  Mid upper arm  0.24                               +-----------------+-------------+----------+--------------+  Dist upper arm       0.15                 branching     +-----------------+-------------+----------+--------------+  Antecubital fossa                       not visualized  +-----------------+-------------+----------+--------------+  Prox forearm                            not visualized  +-----------------+-------------+----------+--------------+  Mid forearm                             not visualized  +-----------------+-------------+----------+--------------+  Wrist                                  not visualized  +-----------------+-------------+----------+--------------+      ASSESSMENT/PLAN:  Patrick [name] is a 58 y.o. male who presents with end stage renal disease  Based on vein mapping and examination, patient is a candidate for left arm fistula creation.  The cephalic vein appears a little small, and the basilic vein appears acceptable in size.  Plan will be for right arm block.  Should this dilate the cephalic vein, I would plan to use this, otherwise he is aware he will receive a brachiobasilic fistula that will require subsequent transposition.. I had an extensive discussion with this patient in regards to the nature of access surgery, including risk, benefits, and alternatives.   The patient is aware that the risks of access surgery include but are not limited to: bleeding, infection, steal syndrome, nerve damage, ischemic monomelic neuropathy, failure of access to mature, complications related to venous hypertension, and possible need for additional access procedures in the future.  I discussed with the patient the nature of the staged access procedure, specifically the need for a second operation to transpose the first stage fistula if it matures adequately.   The patient has agreed to proceed with the above procedure which will be scheduled at his convenience.  [name], MD [facility] [phone] Total time of patient care including pre-visit research, consultation, and documentation greater than 30 minutes

## 2023-11-22 ENCOUNTER — Telehealth: Payer: Self-pay

## 2023-11-22 NOTE — Telephone Encounter (Signed)
 Attempted to reach pt to schedule his surgery. Left VM for him to return our call.

## 2023-11-26 ENCOUNTER — Other Ambulatory Visit: Payer: Self-pay

## 2023-11-26 DIAGNOSIS — N186 End stage renal disease: Secondary | ICD-10-CM

## 2023-12-13 ENCOUNTER — Other Ambulatory Visit: Payer: Self-pay

## 2023-12-13 ENCOUNTER — Encounter (HOSPITAL_COMMUNITY): Payer: Self-pay | Admitting: Vascular Surgery

## 2023-12-13 NOTE — Progress Notes (Signed)
 SDW call  Patient was given pre-op  instructions over the phone. Patient verbalized understanding of instructions provided.     PCP - [facility] Cardiologist - Dr. Lonni Nanas Nephrologist: Riccardo, dialysis M, W, F Pulmonary:    PPM/ICD - denies Device Orders - na Rep Notified - na   Chest x-ray - 09/10/2023 EKG -  08/30/2023 Stress Test - ECHO - 12/30/2022 Cardiac Cath -   Sleep Study/sleep apnea/CPAP: denies  Type II diabetic.  A1C 7.0 on 09/11/2023. Checks his sugars every other day. Takes no diabetic medications Fasting Blood sugar range: 97-200 How often check sugars: every other day   Blood Thinner Instructions: denies Aspirin  Instructions:denies   ERAS Protcol - NPO   Anesthesia review: Yes. DM, HTN, PVD, ESRD with dialysis   Patient denies shortness of breath, fever, cough and chest pain over the phone call  Your procedure is scheduled on Tuesday December 17, 2023  Report to [facility] Main Entrance A at  0530  A.M., then check in with the Admitting office.  Call this number if you have problems the morning of surgery:  [phone]   If you have any questions prior to your surgery date call [phone]: Open Monday-Friday 8am-4pm If you experience any cold or flu symptoms such as cough, fever, chills, shortness of breath, etc. between now and your scheduled surgery, please notify us  at the above number    Remember:  Do not eat or drink after midnight the night before your surgery  You may drink clear liquids until     the morning of your surgery.   Clear liquids allowed are: Water, Non-Citrus Juices (without pulp), Carbonated Beverages, Clear Tea, Black Coffee ONLY (NO MILK, CREAM OR POWDERED CREAMER of any kind), and Gatorade   Take these medicines the morning of surgery with A SIP OF WATER:  None  As of today, STOP taking any Aspirin  (unless otherwise instructed by your surgeon) Aleve , Naproxen , Ibuprofen , Motrin , Advil , Goody's, BC's, all  herbal medications, fish oil, and all vitamins.

## 2023-12-16 NOTE — Anesthesia Preprocedure Evaluation (Addendum)
 Anesthesia Evaluation  Patient identified by MRN, date of birth, ID band Patient awake    Reviewed: Allergy & Precautions, NPO status , Patient's Chart, lab work & pertinent test results  History of Anesthesia Complications Negative for: history of anesthetic complications  Airway Mallampati: I  TM Distance: >3 FB Neck ROM: Full    Dental  (+) Dental Advisory Given   Pulmonary neg pulmonary ROS   breath sounds clear to auscultation       Cardiovascular hypertension (on mitodrine), (-) angina + Peripheral Vascular Disease   Rhythm:Regular Rate:Normal  Pt with hypotensive arrest on induction of last anesthestic, no sequelae  12/2022 ECHO: EF 40 to 45%.  1. LV EF 45 %. The left ventricle has mildly decreased function. The left ventricle demonstrates regional wall motion abnormalities (see scoring  diagram/findings for description). The left ventricular internal cavity  size was mildly dilated. There is mild LVH. Grade I diastolic dysfunction (impaired relaxation). Elevated left ventricular end-diastolic pressure. There is severe  hypokinesis of the left ventricular, basal-mid inferior wall and inferoseptal wall.   2. RVF is mildly reduced. The right ventricular size is normal.   3. The mitral valve is grossly normal. Trivial mitral valve regurgitation.   4. The aortic valve is tricuspid. Aortic valve regurgitation is not visualized.     Neuro/Psych negative neurological ROS     GI/Hepatic negative GI ROS, Neg liver ROS,,,  Endo/Other  diabetes  BMI 38  Renal/GU ESRF and DialysisRenal disease     Musculoskeletal negative musculoskeletal ROS (+)    Abdominal  (+) + obese  Peds  Hematology negative hematology ROS (+)   Anesthesia Other Findings   Reproductive/Obstetrics                              Anesthesia Physical Anesthesia Plan  ASA: 4  Anesthesia Plan: MAC and Regional   Post-op  Pain Management: Tylenol  PO (pre-op )*   Induction:   PONV Risk Score and Plan: 1  Airway Management Planned: Natural Airway and Simple Face Mask  Additional Equipment: ClearSight  Intra-op Plan:   Post-operative Plan:   Informed Consent: I have reviewed the patients History and Physical, chart, labs and discussed the procedure including the risks, benefits and alternatives for the proposed anesthesia with the patient or authorized representative who has indicated his/her understanding and acceptance.     Dental advisory given  Plan Discussed with: CRNA and Surgeon  Anesthesia Plan Comments: (Events of prior anesthetic for AVF noted. Plan routine monitors, with ClearSight BP monitoring in addition to NIBP, Supraclavicular block with sedation.  PAT note by Lynwood Hope, PA-C: 58 year old male follows with cardiology for history of HTN, HLD, HFmrEF, diastolic heart failure.  Echocardiogram 12/30/2022 showed LVEF of 40-45%, mildly dilated left ventricular cavity, mild LVH, G1 DD, and no significant valvular abnormalities.  Seen by Josefa Beauvais, NP on 08/30/2023 for preop evaluation.  Per note, Chart reviewed as part of pre-operative protocol coverage. Given past medical history and time since last visit, based on ACC/AHA guidelines, ARGELIO GRANIER would be at acceptable risk for the planned procedure without further cardiovascular testing. His RCRI is high risk, greater than 11% risk of major cardiac event.  He is able to complete greater than 4 METS of physical activity. Patient was advised that if he develops new symptoms prior to surgery to contact our office to arrange a follow-up appointment.  He verbalized understanding.  Other pertinent history  includes ESRD on HD MWF via right IJ, class III obesity BMI 38, anemia, bilateral BKA, DM2.  Patient presented on 09/10/2023 for AV fistula creation.  Case was ultimately aborted due to cardiorespiratory complications.  Per discharge summary  09/13/2023, After 10 minutes of general anesthesia induction, patient became hypotensive with diffuse ST depression, he was intubated, received multiple pushes of epinephrine  due to hypotension, case was aborted and patient was transferred to ICU. PCCM was consulted. Nephrology was also consulted. Patient underwent hemodialysis. Subsequently, he was extubated and weaned off vasopressors. He was transferred out of ICU.  [facility] took over as primary team on 09/12/2023.  Subsequently, he has remained hemodynamically stable.  Nephrology and vascular surgery have cleared him for discharge.  He will be discharged home today after hemodialysis.  There was no cardiology consultation while admitted.  Patient will need day of surgery labs and evaluation.  Case discussed with anesthesiologist Dr. Keneth.  EKG 08/30/2023: Sinus rhythm with 1st degree A-V block with Premature atrial complexes.  Rate 98. Abnormal QRS-T angle, consider primary T wave abnormality  TTE 12/30/2022: 1. Left ventricular ejection fraction, by estimation, is 40 to 45%. Left  ventricular ejection fraction by PLAX is 45 %. The left ventricle has  mildly decreased function. The left ventricle demonstrates regional wall  motion abnormalities (see scoring  diagram/findings for description). The left ventricular internal cavity  size was mildly dilated. There is mild left ventricular hypertrophy. Left  ventricular diastolic parameters are consistent with Grade I diastolic  dysfunction (impaired relaxation).  Elevated left ventricular end-diastolic pressure. There is severe  hypokinesis of the left ventricular, basal-mid inferior wall and  inferoseptal wall.  2. Right ventricular systolic function is mildly reduced. The right  ventricular size is normal. Tricuspid regurgitation signal is inadequate  for assessing PA pressure.  3. The mitral valve is grossly normal. Trivial mitral valve  regurgitation.  4. The aortic valve is tricuspid.  Aortic valve regurgitation is not  visualized.  5. Aortic dilatation noted. 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  6. The inferior vena cava is normal in size with greater than 50%  respiratory variability, suggesting right atrial pressure of 3 mmHg.    )         Anesthesia Quick Evaluation

## 2023-12-16 NOTE — Progress Notes (Signed)
 Anesthesia Chart Review: Same day workup  58 year old male follows with cardiology for history of HTN, HLD, HFmrEF, diastolic heart failure.  Echocardiogram 12/30/2022 showed LVEF of 40-45%, mildly dilated left ventricular cavity, mild LVH, G1 DD, and no significant valvular abnormalities.  Seen by Josefa Beauvais, NP on 08/30/2023 for preop evaluation.  Per note, Chart reviewed as part of pre-operative protocol coverage. Given past medical history and time since last visit, based on ACC/AHA guidelines, LAZARUS SUDBURY would be at acceptable risk for the planned procedure without further cardiovascular testing. His RCRI is high risk, greater than 11% risk of major cardiac event.  He is able to complete greater than 4 METS of physical activity. Patient was advised that if he develops new symptoms prior to surgery to contact our office to arrange a follow-up appointment.  He verbalized understanding.   Other pertinent history includes ESRD on HD MWF via right IJ, class III obesity BMI 38, anemia, bilateral BKA, DM2.  Patient presented on 09/10/2023 for AV fistula creation.  Case was ultimately aborted due to cardiorespiratory complications.  Per discharge summary 09/13/2023, After 10 minutes of general anesthesia induction, patient became hypotensive with diffuse ST depression, he was intubated, received multiple pushes of epinephrine  due to hypotension, case was aborted and patient was transferred to ICU. PCCM was consulted. Nephrology was also consulted. Patient underwent hemodialysis. Subsequently, he was extubated and weaned off vasopressors. He was transferred out of ICU.  [facility] took over as primary team on 09/12/2023.  Subsequently, he has remained hemodynamically stable.  Nephrology and vascular surgery have cleared him for discharge.  He will be discharged home today after hemodialysis.  There was no cardiology consultation while admitted.  Patient will need day of surgery labs and evaluation.  Case  discussed with anesthesiologist Dr. Keneth.   EKG 08/30/2023: Sinus rhythm with 1st degree A-V block with Premature atrial complexes.  Rate 98. Abnormal QRS-T angle, consider primary T wave abnormality   TTE 12/30/2022: 1. Left ventricular ejection fraction, by estimation, is 40 to 45%. Left  ventricular ejection fraction by PLAX is 45 %. The left ventricle has  mildly decreased function. The left ventricle demonstrates regional wall  motion abnormalities (see scoring  diagram/findings for description). The left ventricular internal cavity  size was mildly dilated. There is mild left ventricular hypertrophy. Left  ventricular diastolic parameters are consistent with Grade I diastolic  dysfunction (impaired relaxation).  Elevated left ventricular end-diastolic pressure. There is severe  hypokinesis of the left ventricular, basal-mid inferior wall and  inferoseptal wall.   2. Right ventricular systolic function is mildly reduced. The right  ventricular size is normal. Tricuspid regurgitation signal is inadequate  for assessing PA pressure.   3. The mitral valve is grossly normal. Trivial mitral valve  regurgitation.   4. The aortic valve is tricuspid. Aortic valve regurgitation is not  visualized.   5. Aortic dilatation noted. 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   6. The inferior vena cava is normal in size with greater than 50%  respiratory variability, suggesting right atrial pressure of 3 mmHg.        Lynwood Geofm RIGGERS [facility] Stay [facility]/Anesthesiology Phone [phone] 12/16/2023 2:41 PM

## 2023-12-17 ENCOUNTER — Ambulatory Visit (HOSPITAL_COMMUNITY): Admitting: Vascular Surgery

## 2023-12-17 ENCOUNTER — Ambulatory Visit (HOSPITAL_COMMUNITY)
Admission: RE | Admit: 2023-12-17 | Discharge: 2023-12-17 | Disposition: A | Discharge status: Home / Self Care | Attending: Vascular Surgery | Admitting: Vascular Surgery

## 2023-12-17 ENCOUNTER — Encounter (HOSPITAL_COMMUNITY)
Admission: RE | Disposition: A | Discharge status: Home / Self Care | Payer: Self-pay | Source: Home / Self Care | Attending: Vascular Surgery

## 2023-12-17 ENCOUNTER — Other Ambulatory Visit (HOSPITAL_COMMUNITY): Payer: Self-pay

## 2023-12-17 ENCOUNTER — Ambulatory Visit (HOSPITAL_BASED_OUTPATIENT_CLINIC_OR_DEPARTMENT_OTHER): Admitting: Vascular Surgery

## 2023-12-17 DIAGNOSIS — Z992 Dependence on renal dialysis: Secondary | ICD-10-CM | POA: Insufficient documentation

## 2023-12-17 DIAGNOSIS — E66813 Obesity, class 3: Secondary | ICD-10-CM | POA: Diagnosis not present

## 2023-12-17 DIAGNOSIS — Z89512 Acquired absence of left leg below knee: Secondary | ICD-10-CM | POA: Insufficient documentation

## 2023-12-17 DIAGNOSIS — E1151 Type 2 diabetes mellitus with diabetic peripheral angiopathy without gangrene: Secondary | ICD-10-CM | POA: Diagnosis not present

## 2023-12-17 DIAGNOSIS — N186 End stage renal disease: Secondary | ICD-10-CM | POA: Insufficient documentation

## 2023-12-17 DIAGNOSIS — Z89511 Acquired absence of right leg below knee: Secondary | ICD-10-CM | POA: Insufficient documentation

## 2023-12-17 DIAGNOSIS — I5042 Chronic combined systolic (congestive) and diastolic (congestive) heart failure: Secondary | ICD-10-CM | POA: Diagnosis not present

## 2023-12-17 DIAGNOSIS — I120 Hypertensive chronic kidney disease with stage 5 chronic kidney disease or end stage renal disease: Secondary | ICD-10-CM | POA: Diagnosis not present

## 2023-12-17 DIAGNOSIS — I132 Hypertensive heart and chronic kidney disease with heart failure and with stage 5 chronic kidney disease, or end stage renal disease: Secondary | ICD-10-CM | POA: Insufficient documentation

## 2023-12-17 DIAGNOSIS — E1122 Type 2 diabetes mellitus with diabetic chronic kidney disease: Secondary | ICD-10-CM | POA: Diagnosis not present

## 2023-12-17 DIAGNOSIS — Z6838 Body mass index (BMI) 38.0-38.9, adult: Secondary | ICD-10-CM | POA: Insufficient documentation

## 2023-12-17 DIAGNOSIS — E1165 Type 2 diabetes mellitus with hyperglycemia: Secondary | ICD-10-CM | POA: Diagnosis not present

## 2023-12-17 LAB — POCT I-STAT, CHEM 8
BUN: 47 mg/dL — ABNORMAL HIGH (ref 6–20)
Calcium, Ion: 1.24 mmol/L (ref 1.15–1.40)
Chloride: 101 mmol/L (ref 98–111)
Creatinine, Ser: 9.8 mg/dL — ABNORMAL HIGH (ref 0.61–1.24)
Glucose, Bld: 124 mg/dL — ABNORMAL HIGH (ref 70–99)
HCT: 34 % — ABNORMAL LOW (ref 39.0–52.0)
Hemoglobin: 11.6 g/dL — ABNORMAL LOW (ref 13.0–17.0)
Potassium: 4.3 mmol/L (ref 3.5–5.1)
Sodium: 136 mmol/L (ref 135–145)
TCO2: 23 mmol/L (ref 22–32)

## 2023-12-17 LAB — GLUCOSE, CAPILLARY
Glucose-Capillary: 119 mg/dL — ABNORMAL HIGH (ref 70–99)
Glucose-Capillary: 193 mg/dL — ABNORMAL HIGH (ref 70–99)

## 2023-12-17 SURGERY — ARTERIOVENOUS (AV) FISTULA CREATION
Anesthesia: Monitor Anesthesia Care | Site: Arm Upper | Laterality: Left

## 2023-12-17 MED ORDER — INSULIN ASPART 100 UNIT/ML IJ SOLN: 0.0000 [IU] | INTRAMUSCULAR | Status: DC | PRN

## 2023-12-17 MED ORDER — ONDANSETRON HCL 4 MG/2ML IJ SOLN
INTRAMUSCULAR | Status: DC | PRN
  Administered 2023-12-17: 4 mg via INTRAVENOUS

## 2023-12-17 MED ORDER — CHLORHEXIDINE GLUCONATE 4 % EX SOLN: 60.0000 mL | Freq: Once | CUTANEOUS | Status: DC

## 2023-12-17 MED ORDER — OXYCODONE HCL 5 MG PO TABS: 5.0000 mg | ORAL_TABLET | Freq: Once | ORAL | Status: DC | PRN

## 2023-12-17 MED ORDER — HEPARIN 6000 UNIT IRRIGATION SOLUTION
Status: AC
  Filled 2023-12-17: qty 500

## 2023-12-17 MED ORDER — LIDOCAINE-EPINEPHRINE (PF) 1.5 %-1:200000 IJ SOLN
INTRAMUSCULAR | Status: DC | PRN
  Administered 2023-12-17: 30 mL via PERINEURAL

## 2023-12-17 MED ORDER — FENTANYL CITRATE (PF) 100 MCG/2ML IJ SOLN
INTRAMUSCULAR | Status: DC | PRN
  Administered 2023-12-17: 50 ug via INTRAVENOUS

## 2023-12-17 MED ORDER — ORAL CARE MOUTH RINSE: 15.0000 mL | Freq: Once | OROMUCOSAL | Status: AC

## 2023-12-17 MED ORDER — PHENYLEPHRINE 80 MCG/ML (10ML) SYRINGE FOR IV PUSH (FOR BLOOD PRESSURE SUPPORT)
PREFILLED_SYRINGE | INTRAVENOUS | Status: DC | PRN
  Administered 2023-12-17: 80 ug via INTRAVENOUS
  Administered 2023-12-17: 160 ug via INTRAVENOUS
  Administered 2023-12-17: 80 ug via INTRAVENOUS
  Administered 2023-12-17: 160 ug via INTRAVENOUS
  Administered 2023-12-17: 80 ug via INTRAVENOUS
  Administered 2023-12-17: 160 ug via INTRAVENOUS

## 2023-12-17 MED ORDER — 0.9 % SODIUM CHLORIDE (POUR BTL) OPTIME
TOPICAL | Status: DC | PRN
Start: 2023-12-17 — End: 2023-12-17
  Administered 2023-12-17: 1000 mL

## 2023-12-17 MED ORDER — CEFAZOLIN SODIUM-DEXTROSE 3-4 GM/150ML-% IV SOLN
3.0000 g | INTRAVENOUS | Status: AC
Start: 2023-12-17 — End: 2023-12-17
  Administered 2023-12-17: 3 g via INTRAVENOUS
  Filled 2023-12-17: qty 150

## 2023-12-17 MED ORDER — HEPARIN SODIUM (PORCINE) 1000 UNIT/ML IJ SOLN
INTRAMUSCULAR | Status: DC | PRN
Start: 2023-12-17 — End: 2023-12-17
  Administered 2023-12-17: 2000 [IU] via INTRAVENOUS

## 2023-12-17 MED ORDER — CHLORHEXIDINE GLUCONATE 0.12 % MT SOLN
15.0000 mL | Freq: Once | OROMUCOSAL | Status: AC
  Administered 2023-12-17: 15 mL via OROMUCOSAL
  Filled 2023-12-17: qty 15

## 2023-12-17 MED ORDER — MIDAZOLAM HCL (PF) 2 MG/2ML IJ SOLN: 0.5000 mg | Freq: Once | INTRAMUSCULAR | Status: DC | PRN

## 2023-12-17 MED ORDER — ACETAMINOPHEN 500 MG PO TABS
1000.0000 mg | ORAL_TABLET | Freq: Once | ORAL | Status: AC
  Administered 2023-12-17: 1000 mg via ORAL
  Filled 2023-12-17: qty 2

## 2023-12-17 MED ORDER — FENTANYL CITRATE (PF) 100 MCG/2ML IJ SOLN: 25.0000 ug | INTRAMUSCULAR | Status: DC | PRN

## 2023-12-17 MED ORDER — MIDAZOLAM HCL (PF) 2 MG/2ML IJ SOLN
INTRAMUSCULAR | Status: DC | PRN
  Administered 2023-12-17: 2 mg via INTRAVENOUS

## 2023-12-17 MED ORDER — OXYCODONE HCL 5 MG/5ML PO SOLN: 5.0000 mg | Freq: Once | ORAL | Status: DC | PRN

## 2023-12-17 MED ORDER — DEXMEDETOMIDINE HCL IN NACL 80 MCG/20ML IV SOLN
INTRAVENOUS | Status: DC | PRN
  Administered 2023-12-17 (×2): 8 ug via INTRAVENOUS

## 2023-12-17 MED ORDER — SODIUM CHLORIDE 0.9 % IV SOLN: INTRAVENOUS | Status: DC

## 2023-12-17 MED ORDER — OXYCODONE-ACETAMINOPHEN 5-325 MG PO TABS
1.0000 | ORAL_TABLET | Freq: Four times a day (QID) | ORAL | 0 refills | Status: AC | PRN
  Filled 2023-12-17: qty 8, 2d supply, fill #0

## 2023-12-17 MED ORDER — HEPARIN 6000 UNIT IRRIGATION SOLUTION
Status: DC | PRN
  Administered 2023-12-17: 1

## 2023-12-17 MED ORDER — DEXMEDETOMIDINE HCL IN NACL 80 MCG/20ML IV SOLN
INTRAVENOUS | Status: AC
  Filled 2023-12-17: qty 20

## 2023-12-17 MED ORDER — MEPERIDINE HCL 50 MG/ML IJ SOLN
6.2500 mg | INTRAMUSCULAR | Status: DC | PRN
  Filled 2023-12-17: qty 1

## 2023-12-17 MED ORDER — MIDAZOLAM HCL 2 MG/2ML IJ SOLN
INTRAMUSCULAR | Status: AC
  Filled 2023-12-17: qty 2

## 2023-12-17 MED ORDER — PROPOFOL 500 MG/50ML IV EMUL
INTRAVENOUS | Status: DC | PRN
  Administered 2023-12-17: 75 ug/kg/min via INTRAVENOUS

## 2023-12-17 MED ORDER — FENTANYL CITRATE (PF) 100 MCG/2ML IJ SOLN
INTRAMUSCULAR | Status: AC
  Filled 2023-12-17: qty 2

## 2023-12-17 SURGICAL SUPPLY — 28 items
ARMBAND PINK RESTRICT EXTREMIT (MISCELLANEOUS) ×1 IMPLANT
BAG COUNTER SPONGE SURGICOUNT (BAG) ×1 IMPLANT
BLADE CLIPPER SURG (BLADE) ×1 IMPLANT
BNDG ELASTIC 4X5.8 VLCR STR LF (GAUZE/BANDAGES/DRESSINGS) ×1 IMPLANT
CANISTER SUCTION 3000ML PPV (SUCTIONS) ×1 IMPLANT
CLIP TI MEDIUM 6 (CLIP) ×1 IMPLANT
CLIP TI WIDE RED SMALL 6 (CLIP) ×1 IMPLANT
COVER PROBE W GEL 5X96 (DRAPES) ×1 IMPLANT
DERMABOND ADVANCED .7 DNX12 (GAUZE/BANDAGES/DRESSINGS) ×1 IMPLANT
ELECTRODE REM PT RTRN 9FT ADLT (ELECTROSURGICAL) ×1 IMPLANT
GLOVE BIOGEL PI IND STRL 8 (GLOVE) ×1 IMPLANT
GOWN STRL REUS W/ TWL LRG LVL3 (GOWN DISPOSABLE) ×2 IMPLANT
GOWN STRL REUS W/TWL 2XL LVL3 (GOWN DISPOSABLE) ×2 IMPLANT
KIT BASIN OR (CUSTOM PROCEDURE TRAY) ×1 IMPLANT
KIT TURNOVER KIT B (KITS) ×1 IMPLANT
PACK CV ACCESS (CUSTOM PROCEDURE TRAY) ×1 IMPLANT
PAD ARMBOARD POSITIONER FOAM (MISCELLANEOUS) ×2 IMPLANT
SLING ARM FOAM STRAP LRG (SOFTGOODS) IMPLANT
SOLN 0.9% NACL POUR BTL 1000ML (IV SOLUTION) ×1 IMPLANT
SOLN STERILE WATER BTL 1000 ML (IV SOLUTION) ×1 IMPLANT
SPIKE FLUID TRANSFER (MISCELLANEOUS) ×1 IMPLANT
SUT MNCRL AB 4-0 PS2 18 (SUTURE) ×1 IMPLANT
SUT PROLENE 6 0 BV (SUTURE) ×1 IMPLANT
SUT PROLENE 7 0 BV 1 (SUTURE) IMPLANT
SUT SILK 2 0 SH (SUTURE) IMPLANT
SUT VIC AB 3-0 SH 27X BRD (SUTURE) ×1 IMPLANT
TOWEL GREEN STERILE (TOWEL DISPOSABLE) ×1 IMPLANT
UNDERPAD 30X36 HEAVY ABSORB (UNDERPADS AND DIAPERS) ×1 IMPLANT

## 2023-12-17 NOTE — Anesthesia Postprocedure Evaluation (Signed)
 Anesthesia Post Note  Patient: Patrick [name]  Procedure(s) Performed: ARTERIOVENOUS (AV) FISTULA CREATION (Left: Arm Upper)     Patient location during evaluation: PACU Anesthesia Type: Regional Level of consciousness: awake and alert, oriented and patient cooperative Pain management: pain level controlled Vital Signs Assessment: post-procedure vital signs reviewed and stable Respiratory status: spontaneous breathing, nonlabored ventilation and respiratory function stable Cardiovascular status: blood pressure returned to baseline and stable Postop Assessment: no apparent nausea or vomiting and adequate PO intake Anesthetic complications: no   No notable events documented.  Last Vitals:  Vitals:   12/17/23 0930 12/17/23 0945  BP: 101/77 114/71  Pulse: 83 80  Resp: 19 17  Temp:    SpO2: 100% 99%    Last Pain:  Vitals:   12/17/23 0922  TempSrc:   PainSc: 0-No pain                 Jaylean Buenaventura,E. Tache Bobst

## 2023-12-17 NOTE — Progress Notes (Signed)
 Phase 2 completed. Waiting on ride home.

## 2023-12-17 NOTE — Progress Notes (Signed)
 Late entry  Dr. Leonce made aware of patient's elevated B/P

## 2023-12-17 NOTE — Transfer of Care (Signed)
 Immediate Anesthesia Transfer of Care Note  Patient: Patrick [name]  Procedure(s) Performed: ARTERIOVENOUS (AV) FISTULA CREATION (Left: Arm Upper)  Patient Location: PACU  Anesthesia Type:MAC and Regional  Level of Consciousness: awake, alert , oriented, and drowsy  Airway & Oxygen Therapy: Patient Spontanous Breathing  Post-op Assessment: Report given to RN and Post -op Vital signs reviewed and stable  Post vital signs: Reviewed and stable  Last Vitals:  Vitals Value Taken Time  BP 122/63 12/17/23 09:21  Temp    Pulse 81 12/17/23 09:24  Resp 19 12/17/23 09:24  SpO2 97 % 12/17/23 09:24  Vitals shown include unfiled device data.  Last Pain:  Vitals:   12/17/23 0620  TempSrc:   PainSc: 0-No pain         Complications: No notable events documented.

## 2023-12-17 NOTE — Discharge Instructions (Signed)
   [facility]  Discharge Instructions  AV Fistula or Graft Surgery for Dialysis Access  Please refer to the following instructions for your post-procedure care. Your surgeon or physician assistant will discuss any changes with you.  Activity  You may drive the day following your surgery, if you are comfortable and no longer taking prescription pain medication. Resume full activity as the soreness in your incision resolves.  Bathing/Showering  You may shower after you go home. Keep your incision dry for 48 hours. Do not soak in a bathtub, hot tub, or swim until the incision heals completely. You may not shower if you have a hemodialysis catheter.  Incision Care  Clean your incision with mild soap and water after 48 hours. Pat the area dry with a clean towel. You do not need a bandage unless otherwise instructed. Do not apply any ointments or creams to your incision. You may have skin glue on your incision. Do not peel it off. It will come off on its own in about one week. Your arm may swell a bit after surgery. To reduce swelling use pillows to elevate your arm so it is above your heart. Your doctor will tell you if you need to lightly wrap your arm with an ACE bandage.  Diet  Resume your normal diet. There are not special food restrictions following this procedure. In order to heal from your surgery, it is CRITICAL to get adequate nutrition. Your body requires vitamins, minerals, and protein. Vegetables are the best source of vitamins and minerals. Vegetables also provide the perfect balance of protein. Processed food has little nutritional value, so try to avoid this.  Medications  Resume taking all of your medications. If your incision is causing pain, you may take over-the counter pain relievers such as acetaminophen  (Tylenol ). If you were prescribed a stronger pain medication, please be aware these medications can cause nausea and constipation. Prevent  nausea by taking the medication with a snack or meal. Avoid constipation by drinking plenty of fluids and eating foods with high amount of fiber, such as fruits, vegetables, and grains.  Do not take Tylenol  if you are taking prescription pain medications.  Follow up Your surgeon may want to see you in the office following your access surgery. If so, this will be arranged at the time of your surgery.  Please call us  immediately for any of the following conditions:  Increased pain, redness, drainage (pus) from your incision site Fever of 101 degrees or higher Severe or worsening pain at your incision site Hand pain or numbness.  Reduce your risk of vascular disease:  Stop smoking. If you would like help, call [facility] at 1-800-QUIT-NOW (509-167-9374) or [place] at (337)101-1092  Manage your cholesterol Maintain a desired weight Control your diabetes Keep your blood pressure down  Dialysis  It will take several weeks to several months for your new dialysis access to be ready for use. Your surgeon will determine when it is okay to use it. Your nephrologist will continue to direct your dialysis. You can continue to use your Permcath until your new access is ready for use.   12/17/2023 Patrick [name] [id number] 02-23-1965  Surgeon(s): Lanis Fonda BRAVO, MD  Procedure(s): ARTERIOVENOUS (AV) FISTULA CREATION  x Do not stick fistula for 12 weeks    If you have any questions, please call the office at [phone].

## 2023-12-17 NOTE — Anesthesia Procedure Notes (Addendum)
 Anesthesia Regional Block: Supraclavicular block   Pre-Anesthetic Checklist: , timeout performed,  Correct Patient, Correct Site, Correct Laterality,  Correct Procedure, Correct Position, site marked,  Risks and benefits discussed,  Surgical consent,  Pre-op  evaluation,  At surgeon's request and post-op pain management  Laterality: Left and Upper  Prep: chloraprep       Needles:  Injection technique: Single-shot  Needle Type: Echogenic Needle     Needle Length: 9cm  Needle Gauge: 21     Additional Needles:   Procedures:,,,, ultrasound used (permanent image in chart),,    Narrative:  Start time: 12/17/2023 7:09 AM End time: 12/17/2023 7:15 AM Injection made incrementally with aspirations every 5 mL.  Performed by: Personally  Anesthesiologist: Leonce [place], MD  Additional Notes: Pt identified in Holding room.  Monitors applied. Working IV access confirmed. Timeout, Sterile prep L clavicle and neck.  #21ga ECHOgenic Arrow block needle to interscalene brachial plexus with US  guidance.  30cc 1.5% Lidocaine  1:200k epi injected incrementally after negative test dose.  Patient asymptomatic, VSS, no heme aspirated, tolerated well.   JAYSON Leonce, MD

## 2023-12-17 NOTE — Op Note (Signed)
    NAME: Patrick [name]    MRN: [id number] DOB: 19-Jan-1966    DATE OF OPERATION: 12/17/2023  PREOP DIAGNOSIS:    End-stage renal disease requiring dialysis  POSTOP DIAGNOSIS:    Same  PROCEDURE:    Left arm brachiocephalic fistula creation  SURGEON: [name]  ASSIST: -  ANESTHESIA: Block  EBL: 10 mL  INDICATIONS:    Patrick [name] is a 58 y.o. 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  He had a previous right sided brachiocephalic fistula exposure, but prior to anastomosis of the surgery was terminated due to cardiopulmonary issues.  He presents today for new fistula.  After discussing the risks and benefits, he elected to proceed with left arm brachiocephalic fistula creation  FINDINGS:   3 mm cephalic vein, 5 mm brachial artery  TECHNIQUE:   The patient was brought to the operating room and placed in supine position. The left arm was prepped and draped in standard fashion. IV antibiotics were administered. A timeout was performed.   The case began with ultrasound insonation of the brachial artery and cephalic vein, which demonstrated sufficient size at the antecubital fossa for arteriovenous fistula.   A transverse incision was made left the elbow creese in the antecubital fossa. The  cephalic vein was isolated for 3 cm in length. Next the aponeurosis was partially released and the brachial artery secured with a vessel loop. The patient was heparinized. The cephalic vein was transected and ligated distally with a 2-0 silk stick-tie. The vein was dilated with coronary dilators and flushed with heparin  saline. Vascular clamps were placed proximally and distally on the brachial artery and a 5 mm arteriotomy was created on the brachial artery. This was flushed with heparin  saline.  An anastomosis was created in end to side fashion on the brachial artery using running 6-0 Prolene suture.  Prior to completing the anastomosis, the vessels were  flushed and the suture line was tied down. There was an excellent thrill in the cephalic vein from the anastomosis to the mid upper bicipital region. The patient had a multiphasic radial and ulnar signal. He had an excellent doppler signal in the fistula. The incision was irrigated and hemostasis achieved with cautery and suture. The deeper tissue was closed with 3-0 Vicryl and the skin closed with 4-0 Monocryl.  Dermabond was applied to the incisions. The patient was transferred to PACU in stable condition.   [name], MD [facility] DATE OF DICTATION:   12/17/2023

## 2023-12-17 NOTE — H&P (Signed)
 Office Note  Patient seen and examined in preop holding.  No complaints. No changes to medication history or physical exam since last seen in clinic. After discussing the risks and benefits of left arm fistula creation. Plan for block, [name] elected to proceed.   [name] MD    CC:  ESRD Requesting Provider:  No ref. provider found  HPI: Patrick [name] is a Right handed 58 y.o. (Dec 02, 1965) [name] with kidney disease who presents at the request of No ref. provider found for permanent HD access.  Patient's previous right sided access procedure was aborted due to cardiopulmonary complications which almost resulted in a code.  He was taken to the ICU intubated, but was subsequently extubated, and discharged a few days later.  Current dialysis is Monday Wednesday Friday via right internal jugular catheter   On exam, Patrick [name] was doing well.  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  He recently moved back to [place].  Medical history includes bilateral lower extremity BKA.  He ambulates with a cane.  Currently doing well on dialysis Monday Wednesday Friday.   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   Gout    History of blood transfusion 07/09/2020   in [place]   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 07/20/2020   History of COVID-19 04/2019   Hypertension    no meds   Hypertensive urgency 09/25/2020   Hypoglycemia 01/03/2022   Osteomyelitis of right foot (HCC)    Pneumonia    x 1   PVD (peripheral  vascular disease)    s/p R BKA   Right arm cellulitis 01/09/2022   Sepsis due to cellulitis (HCC) 06/04/2020   Subacute osteomyelitis, left ankle and foot [facility])     Past Surgical History:  Procedure Laterality Date   AMPUTATION Right 06/06/2020   Procedure: AMPUTATION 5TH RAY;  Surgeon: Harden Jerona GAILS, MD;  Location: [facility] OR;  Service: Orthopedics;  Laterality: Right;   AMPUTATION Right 06/08/2020   Procedure: RIGHT 4TH RAY AMPUTATION;  Surgeon: Harden Jerona GAILS, MD;  Location: [facility] OR;  Service: Orthopedics;  Laterality: Right;   AMPUTATION Right 08/27/2020   Procedure: AMPUTATION BELOW KNEE;  Surgeon: Lucilla Lynwood FORBES, MD;  Location: [facility] OR;  Service: Orthopedics;  Laterality: Right;   AMPUTATION Left 12/30/2020   Procedure: LEFT BELOW KNEE AMPUTATION;  Surgeon: Harden Jerona GAILS, MD;  Location: [facility] OR;  Service: Orthopedics;  Laterality: Left;   AV FISTULA PLACEMENT Right 09/10/2023   Procedure: ATTEMPTED RIGHT ARTERIOVENOUS (AV) FISTULA CREATION;  Surgeon: Rim Fonda FORBES, MD;  [facility];  Service: Vascular;  Laterality: Right;   CHOLECYSTECTOMY N/A 09/25/2020   Procedure: LAPAROSCOPIC CHOLECYSTECTOMY;  Surgeon: Paola Dreama SAILOR, MD;  Location: [facility] OR;  Service: General;  Laterality: N/A;   INSERTION OF DIALYSIS CATHETER N/A 01/01/2023   Procedure: INSERTION OF TUNNELED  DIALYSIS CATHETER RIGHT INTERNAL JUGULAR VEIN;  Surgeon: Rim Fonda FORBES, MD;  Location: [facility]  OR;  Service: Vascular;  Laterality: N/A;    Social History   Socioeconomic History   Marital status: Divorced    Spouse name: Not on file   Number of children: Not on file   Years of education: Not on file   Highest education level: Not on file  Occupational History   Occupation: logistics  Tobacco Use   Smoking status: Never    Passive exposure: Never   Smokeless tobacco: Never  Vaping Use   Vaping status: Never Used  Substance and Sexual Activity   Alcohol use: No   Drug use: No   Sexual activity: Not Currently  Other  Topics Concern   Not on file  Social History Narrative   Not on file   Social Drivers of Health   Financial Resource Strain: Not on file  Food Insecurity: No Food Insecurity (09/10/2023)   Hunger Vital Sign    Worried About Running Out of Food in the Last Year: Never true    Ran Out of Food in the Last Year: Never true  Transportation Needs: No Transportation Needs (09/10/2023)   PRAPARE - Administrator, Civil Service (Medical): No    Lack of Transportation (Non-Medical): No  Physical Activity: Not on file  Stress: Not on file  Social Connections: Unknown (06/30/2021)   Received from [facility]   Social Network    Social Network: Not on file  Intimate Partner Violence: Not At Risk (09/10/2023)   Humiliation, Afraid, Rape, and Kick questionnaire    Fear of Current or Ex-Partner: No    Emotionally Abused: No    Physically Abused: No    Sexually Abused: No   Family History  Problem Relation Age of Onset   Diabetes Neg Hx     Current Facility-Administered Medications  Medication Dose Route Frequency Provider Last Rate Last Admin   0.9 %  sodium chloride  infusion   Intravenous Continuous Shanicqua Coldren E, MD       ceFAZolin  (ANCEF ) IVPB 3g/150 mL premix  3 g Intravenous 30 min Pre-Op  Menashe Kafer E, MD       chlorhexidine  (HIBICLENS ) 4 % liquid 4 Application  60 mL Topical Once Argelia Formisano E, MD       And   [START ON 12/18/2023] chlorhexidine  (HIBICLENS ) 4 % liquid 4 Application  60 mL Topical Once Lelani Garnett E, MD       insulin  aspart (novoLOG ) injection 0-7 Units  0-7 Units Subcutaneous Q2H PRN Leonce Athens, MD       Facility-Administered Medications Ordered in Other Encounters  Medication Dose Route Frequency Provider Last Rate Last Admin   ketamine  (KETALAR ) bolus via infusion 68.05 mg  0.5 mg/kg Intravenous Once Duda, Marcus V, MD        Allergies  Allergen Reactions   Propofol  Other (See Comments)    Stop breathing     REVIEW OF  SYSTEMS:  [X]  denotes positive finding, [ ]  denotes negative finding Cardiac  Comments:  Chest pain or chest pressure:    Shortness of breath upon exertion:    Short of breath when lying flat:    Irregular heart rhythm:        Vascular    Pain in calf, thigh, or hip brought on by ambulation:    Pain in feet at night that wakes you up from your sleep:     Blood clot in your veins:    Leg swelling:         Pulmonary  Oxygen at home:    Productive cough:     Wheezing:         Neurologic    Sudden weakness in arms or legs:     Sudden numbness in arms or legs:     Sudden onset of difficulty speaking or slurred speech:    Temporary loss of vision in one eye:     Problems with dizziness:         Gastrointestinal    Blood in stool:     Vomited blood:         Genitourinary    Burning when urinating:     Blood in urine:        Psychiatric    Major depression:         Hematologic    Bleeding problems:    Problems with blood clotting too easily:        Skin    Rashes or ulcers:        Constitutional    Fever or chills:      PHYSICAL EXAMINATION:  Vitals:   12/13/23 1705 12/17/23 0606  BP:  (!) 142/106  Pulse:  86  Resp:  20  Temp:  98.4 F (36.9 C)  TempSrc:  Oral  SpO2:  96%  Weight: (!) 142.9 kg (!) 145.2 kg  Height: 6' 4 (1.93 m) 6' 4 (1.93 m)     General:  WDWN in NAD; vital signs documented above Gait: Not observed HENT: WNL, normocephalic Pulmonary: normal non-labored breathing , without Rales, rhonchi,  wheezing Cardiac: regular HR,  Abdomen: soft, NT, no masses Skin: without rashes Vascular Exam/Pulses:  Right Left  Radial 2+ (normal) 2+ (normal)  Ulnar    Brachial 2+ (normal) 2+ (normal)               Extremities: without ischemic changes, without Gangrene , without cellulitis; without open wounds;  Musculoskeletal: no muscle wasting or atrophy  Neurologic: A&O X 3;  No focal weakness or paresthesias are detected Psychiatric:  The pt  has Normal affect.   Non-Invasive Vascular Imaging:   +-----------------+-------------+----------+---------+  Right Cephalic   Diameter (cm)Depth (cm)Findings   +-----------------+-------------+----------+---------+  Shoulder            0.29                          +-----------------+-------------+----------+---------+  Prox upper arm       0.34                          +-----------------+-------------+----------+---------+  Mid upper arm        0.41                          +-----------------+-------------+----------+---------+  Dist upper arm       0.19               branching  +-----------------+-------------+----------+---------+  Antecubital fossa    0.25               branching  +-----------------+-------------+----------+---------+  Prox forearm         0.31                          +-----------------+-------------+----------+---------+  Mid forearm          0.07  branching  +-----------------+-------------+----------+---------+  Wrist               0.06                          +-----------------+-------------+----------+---------+   +-----------------+-------------+----------+---------+  Right Basilic    Diameter (cm)Depth (cm)Findings   +-----------------+-------------+----------+---------+  Prox upper arm       0.35                          +-----------------+-------------+----------+---------+  Mid upper arm        0.27               branching  +-----------------+-------------+----------+---------+  Dist upper arm       0.39               branching  +-----------------+-------------+----------+---------+  Antecubital fossa    0.39                          +-----------------+-------------+----------+---------+  Prox forearm         0.23                          +-----------------+-------------+----------+---------+  Mid forearm          0.20               branching   +-----------------+-------------+----------+---------+  Wrist               0.24                          +-----------------+-------------+----------+---------+   +-----------------+-------------+----------+---------+  Left Cephalic    Diameter (cm)Depth (cm)Findings   +-----------------+-------------+----------+---------+  Shoulder            0.22                          +-----------------+-------------+----------+---------+  Prox upper arm       0.53                          +-----------------+-------------+----------+---------+  Mid upper arm        0.22               branching  +-----------------+-------------+----------+---------+  Dist upper arm       0.26                          +-----------------+-------------+----------+---------+  Antecubital fossa    0.30               branching  +-----------------+-------------+----------+---------+  Prox forearm         0.17                          +-----------------+-------------+----------+---------+  Mid forearm          0.17               branching  +-----------------+-------------+----------+---------+  Wrist               0.19                          +-----------------+-------------+----------+---------+   +-----------------+-------------+----------+--------------+  Left Basilic  Diameter (cm)Depth (cm)   Findings     +-----------------+-------------+----------+--------------+  Shoulder            0.23                               +-----------------+-------------+----------+--------------+  Prox upper arm       0.20                               +-----------------+-------------+----------+--------------+  Mid upper arm        0.24                               +-----------------+-------------+----------+--------------+  Dist upper arm       0.15                 branching     +-----------------+-------------+----------+--------------+   Antecubital fossa                       not visualized  +-----------------+-------------+----------+--------------+  Prox forearm                            not visualized  +-----------------+-------------+----------+--------------+  Mid forearm                             not visualized  +-----------------+-------------+----------+--------------+  Wrist                                  not visualized  +-----------------+-------------+----------+--------------+     ASSESSMENT/PLAN:  JAXAN MICHEL is a 58 y.o. [name] who presents with end stage renal disease  Based on vein mapping and examination, patient is a candidate for left arm fistula creation.  Left arm block.  Should this dilate the cephalic vein. I had an extensive discussion with this patient in regards to the nature of access surgery, including risk, benefits, and alternatives.   The patient is aware that the risks of access surgery include but are not limited to: bleeding, infection, steal syndrome, nerve damage, ischemic monomelic neuropathy, failure of access to mature, complications related to venous hypertension, and possible need for additional access procedures in the future.  I discussed with the patient the nature of the staged access procedure, specifically the need for a second operation to transpose the first stage fistula if it matures adequately.   The patient has agreed to proceed with the above procedure which will be scheduled at his convenience.  [name], MD [facility] [phone] Total time of patient care including pre-visit research, consultation, and documentation greater than 30 minutes

## 2023-12-18 ENCOUNTER — Encounter (HOSPITAL_COMMUNITY): Payer: Self-pay | Admitting: Vascular Surgery

## 2023-12-23 ENCOUNTER — Other Ambulatory Visit: Payer: Self-pay

## 2023-12-23 ENCOUNTER — Encounter: Payer: Self-pay | Admitting: Radiology

## 2023-12-23 DIAGNOSIS — N186 End stage renal disease: Secondary | ICD-10-CM

## 2024-01-23 NOTE — Progress Notes (Deleted)
 Cardiology Office Note:    Date:  01/23/2024   ID:  Patrick [name], DOB [birth year], MRN [id number]  PCP:  [facility]  Cardiologist:  Lonni LITTIE Nanas, MD  Electrophysiologist:  None   Referring MD: Pa, [facility]   No chief complaint on file. ***  History of Present Illness:    Patrick [name] is a 58 y.o. male with a hx of chronic systolic heart failure, ESRD, hypertension, T2DM, bilateral BKA who presents for follow-up.  Echocardiogram 12/30/2022 showed EF 40 to 45%, mild RV dysfunction, dilated ascending aorta measuring 43 mm.  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   Gout    History of blood transfusion 07/09/2020   in [place]   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 07/20/2020   History of COVID-19 04/2019   Hypertension    no meds   Hypertensive urgency 09/25/2020   Hypoglycemia 01/03/2022   Osteomyelitis of right foot (HCC)    Pneumonia    x 1   PVD (peripheral vascular disease)    s/p R BKA   Right arm cellulitis 01/09/2022   Sepsis due to cellulitis (HCC) 06/04/2020   Subacute osteomyelitis, left ankle and foot [facility])     Past Surgical History:  Procedure Laterality Date   AMPUTATION Right 06/06/2020   Procedure: AMPUTATION 5TH RAY;  Surgeon: Harden Jerona GAILS, MD;  Location: [facility] OR;  Service: Orthopedics;  Laterality: Right;   AMPUTATION Right 06/08/2020   Procedure: RIGHT 4TH RAY AMPUTATION;  Surgeon: Harden Jerona GAILS, MD;  Location: [facility] OR;  Service: Orthopedics;  Laterality: Right;   AMPUTATION Right 08/27/2020    Procedure: AMPUTATION BELOW KNEE;  Surgeon: Lucilla Lynwood BRAVO, MD;  Location: [facility] OR;  Service: Orthopedics;  Laterality: Right;   AMPUTATION Left 12/30/2020   Procedure: LEFT BELOW KNEE AMPUTATION;  Surgeon: Harden Jerona GAILS, MD;  Location: [facility] OR;  Service: Orthopedics;  Laterality: Left;   AV FISTULA PLACEMENT Right 09/10/2023   Procedure: ATTEMPTED RIGHT ARTERIOVENOUS (AV) FISTULA CREATION;  Surgeon: Lanis Fonda BRAVO, MD;  Location: [facility] OR;  Service: Vascular;  Laterality: Right;   AV FISTULA PLACEMENT Left 12/17/2023   Procedure: LEFT BRACHIOCEPHALIC FISTULA CREATION;  Surgeon: Lanis Fonda BRAVO, MD;  Location: [facility] OR;  Service: Vascular;  Laterality: Left;   CHOLECYSTECTOMY N/A 09/25/2020   Procedure: LAPAROSCOPIC CHOLECYSTECTOMY;  Surgeon: Paola Dreama SAILOR, MD;  Location: [facility] OR;  Service: General;  Laterality: N/A;   INSERTION OF DIALYSIS CATHETER N/A 01/01/2023   Procedure: INSERTION OF TUNNELED  DIALYSIS CATHETER RIGHT INTERNAL JUGULAR VEIN;  Surgeon: Lanis Fonda BRAVO, MD;  Location: [facility] OR;  Service: Vascular;  Laterality: N/A;    Current Medications: No outpatient medications have been marked as taking for the 01/24/24 encounter (Appointment) with Nanas Lonni LITTIE, MD.     Allergies:   Patient has no known allergies.   Social History   Socioeconomic History   Marital status: Divorced    Spouse name: Not on file  Number of children: Not on file   Years of education: Not on file   Highest education level: Not on file  Occupational History   Occupation: logistics  Tobacco Use   Smoking status: Never    Passive exposure: Never   Smokeless tobacco: Never  Vaping Use   Vaping status: Never Used  Substance and Sexual Activity   Alcohol use: No   Drug use: No   Sexual activity: Not Currently  Other Topics Concern   Not on file  Social History Narrative   Not on file   Social Drivers of Health   Financial Resource Strain: Not on file  Food Insecurity: No Food Insecurity  (09/10/2023)   Hunger Vital Sign    Worried About Running Out of Food in the Last Year: Never true    Ran Out of Food in the Last Year: Never true  Transportation Needs: No Transportation Needs (09/10/2023)   PRAPARE - Administrator, Civil Service (Medical): No    Lack of Transportation (Non-Medical): No  Physical Activity: Not on file  Stress: Not on file  Social Connections: Unknown (06/30/2021)   Received from [facility]   Social Network    Social Network: Not on file     Family History: The patient's ***family history is negative for Diabetes.  ROS:   Please see the history of present illness.    *** All other systems reviewed and are negative.  EKGs/Labs/Other Studies Reviewed:    The following studies were reviewed today: ***  EKG:  EKG is *** ordered today.  The ekg ordered today demonstrates ***  Recent Labs: 02/01/2023: ALT 11; B Natriuretic Peptide 444.3 09/13/2023: Magnesium  2.2; Platelets 276 12/17/2023: BUN 47; Creatinine, Ser 9.80; Hemoglobin 11.6; Potassium 4.3; Sodium 136  Recent Lipid Panel    Component Value Date/Time   CHOL 186 06/01/2021 0000   TRIG 109 09/11/2023 0350   HDL 55 06/01/2021 0000   CHOLHDL 3.4 06/01/2021 0000   LDLCALC 115 (H) 06/01/2021 0000    Physical Exam:    VS:  There were no vitals taken for this visit.    Wt Readings from Last 3 Encounters:  12/17/23 (!) 320 lb (145.2 kg)  11/21/23 (!) 319 lb (144.7 kg)  09/11/23 (!) 319 lb 10.7 oz (145 kg)     GEN: *** Well nourished, well developed in no acute distress HEENT: Normal NECK: No JVD; No carotid bruits LYMPHATICS: No lymphadenopathy CARDIAC: ***RRR, no murmurs, rubs, gallops RESPIRATORY:  Clear to auscultation without rales, wheezing or rhonchi  ABDOMEN: Soft, non-tender, non-distended MUSCULOSKELETAL:  No edema; No deformity  SKIN: Warm and dry NEUROLOGIC:  Alert and oriented x 3 PSYCHIATRIC:  Normal affect   ASSESSMENT:    No diagnosis  found. PLAN:    Chronic systolic heart failure: Echocardiogram 12/2022 showed EF 40 to 45% - Recommend stress PET evaluate for ischemia*** - GDMT limited by soft BP and ESRD.  He is on midodrine   Hyperlipidemia: Should be on statin with history of T2DM - Check lipid panel start statin***  T2DM:  Bilateral BKA: Due to recurrent infection/osteomyelitis  RTC in***   Medication Adjustments/Labs and Tests Ordered: Current medicines are reviewed at length with the patient today.  Concerns regarding medicines are outlined above.  No orders of the defined types were placed in this encounter.  No orders of the defined types were placed in this encounter.   There are no Patient Instructions on file for this visit.   Signed,  Lonni LITTIE Nanas, MD  01/23/2024 8:57 PM    [facility]

## 2024-01-24 ENCOUNTER — Ambulatory Visit: Attending: Cardiology | Admitting: Cardiology

## 2024-01-30 ENCOUNTER — Ambulatory Visit (HOSPITAL_COMMUNITY)

## 2024-01-30 NOTE — Progress Notes (Deleted)
°  POST OPERATIVE OFFICE NOTE    CC:  F/u for surgery  HPI:  This is a 58 y.o. male who is s/p left BC AVF on 12/17/2023 by Dr. Lanis.  He had attempted right BC AVF on 09/10/2023, however, he became severely hypotensive 10 minutes into the procedure necessitating intubation, A-line, pressor. There were EKG changes, therefore the surgery was terminated, and the patient was taken to the ICU intubated for further resuscitation.   Pt states he does *** have pain/numbness in the *** hand.    The pt *** on dialysis *** at *** location.   Allergies[1]  Current Outpatient Medications  Medication Sig Dispense Refill   calcium  acetate (PHOSLO ) 667 MG tablet Take 667 mg by mouth 3 (three) times daily.     midodrine  (PROAMATINE ) 10 MG tablet Take 1 tablet (10 mg total) by mouth 3 (three) times daily with meals. (Patient not taking: Reported on 12/13/2023) 90 tablet 0   oxyCODONE -acetaminophen  (PERCOCET) 5-325 MG tablet Take 1 tablet by mouth every 6 (six) hours as needed. 8 tablet 0   tiZANidine  (ZANAFLEX ) 2 MG tablet Take 1 tablet (2 mg total) by mouth daily as needed for muscle spasms (muscle pain). (Patient not taking: Reported on 12/13/2023) 30 tablet 0   No current facility-administered medications for this visit.   Facility-Administered Medications Ordered in Other Visits  Medication Dose Route Frequency Provider Last Rate Last Admin   ketamine  (KETALAR ) bolus via infusion 68.05 mg  0.5 mg/kg Intravenous Once Duda, Marcus V, MD         ROS:  See HPI  Physical Exam:  ***  Incision:  *** Extremities:   There *** a palpable *** pulse.   Motor and sensory *** in tact.   There *** a thrill/bruit present.  Access is *** easily palpable   Dialysis Duplex on 01/30/2024: ***   Assessment/Plan:  This is a 58 y.o. male who is s/p: ***  -the pt does *** have evidence of steal. -pt's access can be used ***. -if pt has tunneled catheter, this can be removed at the discretion of the  dialysis center once the pt's access has been successfully cannulated to their satisfaction.  -discussed with pt that access does not last forever and will need intervention or even new access at some point.  -the pt will follow up ***   Lucie Apt, [facility] [phone]  Clinic MD:  Lanis    [1] No Known Allergies

## 2024-03-27 ENCOUNTER — Encounter (HOSPITAL_COMMUNITY): Payer: Self-pay | Admitting: Vascular Surgery
